# Patient Record
Sex: Male | Born: 1945
Health system: Southern US, Community
[De-identification: ages and names within clinical notes are randomized; demographics above are authoritative.]

## PROBLEM LIST (undated history)

## (undated) DIAGNOSIS — G4761 Periodic limb movement disorder: Secondary | ICD-10-CM

## (undated) DIAGNOSIS — N529 Male erectile dysfunction, unspecified: Secondary | ICD-10-CM

## (undated) DIAGNOSIS — I255 Ischemic cardiomyopathy: Secondary | ICD-10-CM

## (undated) DIAGNOSIS — T7840XA Allergy, unspecified, initial encounter: Secondary | ICD-10-CM

## (undated) DIAGNOSIS — I428 Other cardiomyopathies: Secondary | ICD-10-CM

## (undated) DIAGNOSIS — G245 Blepharospasm: Secondary | ICD-10-CM

## (undated) DIAGNOSIS — E039 Hypothyroidism, unspecified: Secondary | ICD-10-CM

## (undated) DIAGNOSIS — M1990 Unspecified osteoarthritis, unspecified site: Secondary | ICD-10-CM

## (undated) DIAGNOSIS — I509 Heart failure, unspecified: Secondary | ICD-10-CM

## (undated) DIAGNOSIS — I219 Acute myocardial infarction, unspecified: Secondary | ICD-10-CM

## (undated) DIAGNOSIS — K5790 Diverticulosis of intestine, part unspecified, without perforation or abscess without bleeding: Secondary | ICD-10-CM

## (undated) DIAGNOSIS — E559 Vitamin D deficiency, unspecified: Secondary | ICD-10-CM

## (undated) DIAGNOSIS — I5022 Chronic systolic (congestive) heart failure: Secondary | ICD-10-CM

## (undated) DIAGNOSIS — J189 Pneumonia, unspecified organism: Secondary | ICD-10-CM

## (undated) DIAGNOSIS — C801 Malignant (primary) neoplasm, unspecified: Secondary | ICD-10-CM

## (undated) DIAGNOSIS — Z86010 Personal history of colonic polyps: Secondary | ICD-10-CM

## (undated) DIAGNOSIS — R42 Dizziness and giddiness: Secondary | ICD-10-CM

## (undated) DIAGNOSIS — I2510 Atherosclerotic heart disease of native coronary artery without angina pectoris: Secondary | ICD-10-CM

## (undated) DIAGNOSIS — I739 Peripheral vascular disease, unspecified: Secondary | ICD-10-CM

## (undated) DIAGNOSIS — E785 Hyperlipidemia, unspecified: Secondary | ICD-10-CM

## (undated) DIAGNOSIS — Z95810 Presence of automatic (implantable) cardiac defibrillator: Secondary | ICD-10-CM

## (undated) DIAGNOSIS — I2589 Other forms of chronic ischemic heart disease: Secondary | ICD-10-CM

## (undated) DIAGNOSIS — G4710 Hypersomnia, unspecified: Secondary | ICD-10-CM

## (undated) DIAGNOSIS — G4730 Sleep apnea, unspecified: Secondary | ICD-10-CM

## (undated) DIAGNOSIS — G4733 Obstructive sleep apnea (adult) (pediatric): Secondary | ICD-10-CM

## (undated) DIAGNOSIS — F528 Other sexual dysfunction not due to a substance or known physiological condition: Secondary | ICD-10-CM

## (undated) DIAGNOSIS — J309 Allergic rhinitis, unspecified: Secondary | ICD-10-CM

## (undated) DIAGNOSIS — I10 Essential (primary) hypertension: Secondary | ICD-10-CM

## (undated) DIAGNOSIS — Z8719 Personal history of other diseases of the digestive system: Secondary | ICD-10-CM

## (undated) DIAGNOSIS — Z9089 Acquired absence of other organs: Secondary | ICD-10-CM

## (undated) DIAGNOSIS — Z951 Presence of aortocoronary bypass graft: Secondary | ICD-10-CM

## (undated) HISTORY — DX: Ischemic cardiomyopathy: I25.5

## (undated) HISTORY — DX: Allergic rhinitis, unspecified: J30.9

## (undated) HISTORY — PX: OTHER SURGICAL HISTORY: SHX169

## (undated) HISTORY — PX: TONSILLECTOMY AND ADENOIDECTOMY: SUR1326

## (undated) HISTORY — PX: PENILE PROSTHESIS IMPLANT: SHX240

## (undated) HISTORY — DX: Other forms of chronic ischemic heart disease: I25.89

## (undated) HISTORY — DX: Hypersomnia, unspecified: G47.10

## (undated) HISTORY — DX: Male erectile dysfunction, unspecified: N52.9

## (undated) HISTORY — DX: Other cardiomyopathies: I42.8

## (undated) HISTORY — DX: Hyperlipidemia, unspecified: E78.5

## (undated) HISTORY — DX: Dizziness and giddiness: R42

## (undated) HISTORY — DX: Diverticulosis of intestine, part unspecified, without perforation or abscess without bleeding: K57.90

## (undated) HISTORY — DX: Unspecified osteoarthritis, unspecified site: M19.90

## (undated) HISTORY — DX: Hypothyroidism, unspecified: E03.9

## (undated) HISTORY — DX: Acquired absence of other organs: Z90.89

## (undated) HISTORY — DX: Obstructive sleep apnea (adult) (pediatric): G47.33

## (undated) HISTORY — DX: Acute myocardial infarction, unspecified: I21.9

## (undated) HISTORY — DX: Allergy, unspecified, initial encounter: T78.40XA

## (undated) HISTORY — DX: Atherosclerotic heart disease of native coronary artery without angina pectoris: I25.10

## (undated) HISTORY — PX: CORONARY ARTERY BYPASS GRAFT: SHX141

## (undated) HISTORY — DX: Vitamin D deficiency, unspecified: E55.9

## (undated) HISTORY — DX: Personal history of colonic polyps: Z86.010

## (undated) HISTORY — DX: Periodic limb movement disorder: G47.61

## (undated) HISTORY — DX: Chronic systolic (congestive) heart failure: I50.22

## (undated) HISTORY — PX: COLONOSCOPY: SHX174

## (undated) HISTORY — DX: Presence of aortocoronary bypass graft: Z95.1

## (undated) HISTORY — PX: CHOLECYSTECTOMY: SHX55

## (undated) HISTORY — DX: Essential (primary) hypertension: I10

## (undated) HISTORY — DX: Sleep apnea, unspecified: G47.30

## (undated) HISTORY — DX: Other sexual dysfunction not due to a substance or known physiological condition: F52.8

## (undated) HISTORY — PX: THORACOTOMY: SUR1349

## (undated) MED FILL — Dexamethasone Sodium Phosphate Inj 100 MG/10ML: INTRAMUSCULAR | Qty: 1 | Status: AC

## (undated) MED FILL — Etoposide Inj 1 GM/50ML (20 MG/ML): INTRAVENOUS | Qty: 4.5 | Status: AC

## (undated) MED FILL — Etoposide Inj 1 GM/50ML (20 MG/ML): INTRAVENOUS | Qty: 5.5 | Status: AC

---

## 1997-08-10 HISTORY — PX: OTHER SURGICAL HISTORY: SHX169

## 1999-04-25 ENCOUNTER — Inpatient Hospital Stay (HOSPITAL_COMMUNITY): Admission: RE | Admit: 1999-04-25 | Discharge: 1999-04-26 | Payer: Self-pay | Admitting: Urology

## 1999-04-25 ENCOUNTER — Encounter: Payer: Self-pay | Admitting: Urology

## 2001-12-23 ENCOUNTER — Encounter: Admission: RE | Admit: 2001-12-23 | Discharge: 2001-12-23 | Payer: Self-pay | Admitting: Internal Medicine

## 2001-12-23 ENCOUNTER — Encounter: Payer: Self-pay | Admitting: Internal Medicine

## 2004-06-26 ENCOUNTER — Ambulatory Visit: Payer: Self-pay | Admitting: Internal Medicine

## 2004-07-18 ENCOUNTER — Ambulatory Visit: Payer: Self-pay | Admitting: Internal Medicine

## 2004-08-07 ENCOUNTER — Ambulatory Visit: Payer: Self-pay | Admitting: *Deleted

## 2004-09-22 ENCOUNTER — Ambulatory Visit: Payer: Self-pay | Admitting: Cardiology

## 2004-09-25 ENCOUNTER — Ambulatory Visit: Payer: Self-pay | Admitting: *Deleted

## 2004-12-15 ENCOUNTER — Ambulatory Visit: Payer: Self-pay | Admitting: Cardiology

## 2005-02-27 ENCOUNTER — Ambulatory Visit: Payer: Self-pay

## 2005-03-06 ENCOUNTER — Ambulatory Visit: Payer: Self-pay | Admitting: Cardiology

## 2005-05-21 ENCOUNTER — Ambulatory Visit: Payer: Self-pay

## 2005-06-11 ENCOUNTER — Ambulatory Visit: Payer: Self-pay | Admitting: Internal Medicine

## 2005-07-07 ENCOUNTER — Encounter: Admission: RE | Admit: 2005-07-07 | Discharge: 2005-07-07 | Payer: Self-pay | Admitting: Orthopedic Surgery

## 2005-10-02 ENCOUNTER — Ambulatory Visit: Payer: Self-pay | Admitting: Internal Medicine

## 2006-03-05 ENCOUNTER — Ambulatory Visit: Payer: Self-pay | Admitting: Cardiology

## 2006-03-05 ENCOUNTER — Ambulatory Visit: Payer: Self-pay

## 2006-03-12 ENCOUNTER — Ambulatory Visit: Payer: Self-pay | Admitting: Cardiology

## 2006-05-10 ENCOUNTER — Ambulatory Visit: Payer: Self-pay | Admitting: Cardiology

## 2006-06-21 ENCOUNTER — Ambulatory Visit: Payer: Self-pay | Admitting: Cardiology

## 2006-06-21 LAB — CONVERTED CEMR LAB
ALT: 26 units/L (ref 0–40)
AST: 21 units/L (ref 0–37)
Albumin: 3.8 g/dL (ref 3.5–5.2)
Alkaline Phosphatase: 44 units/L (ref 39–117)
Bilirubin, Direct: 0.2 mg/dL (ref 0.0–0.3)
Chol/HDL Ratio, serum: 5.8
Cholesterol: 185 mg/dL (ref 0–200)
HDL: 31.9 mg/dL — ABNORMAL LOW (ref >39.0)
LDL Cholesterol: 136 mg/dL — ABNORMAL HIGH (ref 0–99)
Total Bilirubin: 1.2 mg/dL (ref 0.3–1.2)
Total Protein: 7.1 g/dL (ref 6.0–8.3)
Triglyceride fasting, serum: 85 mg/dL (ref 0–149)
VLDL: 17 mg/dL (ref 0–40)

## 2006-06-24 ENCOUNTER — Ambulatory Visit: Payer: Self-pay | Admitting: Cardiology

## 2006-10-18 ENCOUNTER — Ambulatory Visit: Payer: Self-pay | Admitting: Internal Medicine

## 2006-10-18 LAB — CONVERTED CEMR LAB
ALT: 24 units/L (ref 0–40)
AST: 18 units/L (ref 0–37)
Albumin: 4 g/dL (ref 3.5–5.2)
Alkaline Phosphatase: 50 units/L (ref 39–117)
BUN: 15 mg/dL (ref 6–23)
Basophils Absolute: 0 10*3/uL (ref 0.0–0.1)
Basophils Relative: 0.6 % (ref 0.0–1.0)
Bilirubin Urine: NEGATIVE
Bilirubin, Direct: 0.2 mg/dL (ref 0.0–0.3)
CO2: 30 meq/L (ref 19–32)
Calcium: 9.2 mg/dL (ref 8.4–10.5)
Chloride: 102 meq/L (ref 96–112)
Cholesterol: 204 mg/dL (ref 0–200)
Creatinine, Ser: 1.1 mg/dL (ref 0.4–1.5)
Direct LDL: 136.5 mg/dL
Eosinophils Absolute: 0.2 10*3/uL (ref 0.0–0.6)
Eosinophils Relative: 3.2 % (ref 0.0–5.0)
GFR calc Af Amer: 88 mL/min
GFR calc non Af Amer: 72 mL/min
Glucose, Bld: 103 mg/dL — ABNORMAL HIGH (ref 70–99)
HCT: 46.1 % (ref 39.0–52.0)
HDL: 41.7 mg/dL (ref >39.0)
Hemoglobin, Urine: NEGATIVE
Hemoglobin: 15.8 g/dL (ref 13.0–17.0)
Ketones, ur: NEGATIVE mg/dL
Leukocytes, UA: NEGATIVE
Lymphocytes Relative: 33 % (ref 12.0–46.0)
MCHC: 34.3 g/dL (ref 30.0–36.0)
MCV: 92.1 fL (ref 78.0–100.0)
Monocytes Absolute: 0.4 10*3/uL (ref 0.2–0.7)
Monocytes Relative: 6.8 % (ref 3.0–11.0)
Neutro Abs: 3 10*3/uL (ref 1.4–7.7)
Neutrophils Relative %: 56.4 % (ref 43.0–77.0)
Nitrite: NEGATIVE
PSA: 0.65 ng/mL (ref 0.10–4.00)
Platelets: 187 10*3/uL (ref 150–400)
Potassium: 4.5 meq/L (ref 3.5–5.1)
RBC: 5.01 M/uL (ref 4.22–5.81)
RDW: 12.1 % (ref 11.5–14.6)
Sodium: 139 meq/L (ref 135–145)
Specific Gravity, Urine: 1.025 (ref 1.000–1.03)
TSH: 2.91 microintl units/mL (ref 0.35–5.50)
Total Bilirubin: 0.9 mg/dL (ref 0.3–1.2)
Total CHOL/HDL Ratio: 4.9
Total Protein, Urine: NEGATIVE mg/dL
Total Protein: 7.3 g/dL (ref 6.0–8.3)
Triglycerides: 185 mg/dL — ABNORMAL HIGH (ref 0–149)
Urine Glucose: NEGATIVE mg/dL
Urobilinogen, UA: 0.2 (ref 0.0–1.0)
VLDL: 37 mg/dL (ref 0–40)
WBC: 5.3 10*3/uL (ref 4.5–10.5)
pH: 5 (ref 5.0–8.0)

## 2006-12-13 ENCOUNTER — Ambulatory Visit: Payer: Self-pay | Admitting: Cardiology

## 2006-12-13 LAB — CONVERTED CEMR LAB
ALT: 19 units/L (ref 0–40)
AST: 19 units/L (ref 0–37)
Albumin: 3.8 g/dL (ref 3.5–5.2)
Alkaline Phosphatase: 47 units/L (ref 39–117)
Bilirubin, Direct: 0.2 mg/dL (ref 0.0–0.3)
Cholesterol: 239 mg/dL (ref 0–200)
Direct LDL: 169.4 mg/dL
HDL: 34.1 mg/dL — ABNORMAL LOW (ref >39.0)
Total Bilirubin: 1.3 mg/dL — ABNORMAL HIGH (ref 0.3–1.2)
Total CHOL/HDL Ratio: 7
Total Protein: 6.9 g/dL (ref 6.0–8.3)
Triglycerides: 122 mg/dL (ref 0–149)
VLDL: 24 mg/dL (ref 0–40)

## 2006-12-27 ENCOUNTER — Ambulatory Visit: Payer: Self-pay | Admitting: Internal Medicine

## 2007-01-26 ENCOUNTER — Ambulatory Visit: Payer: Self-pay | Admitting: Cardiology

## 2007-01-26 LAB — CONVERTED CEMR LAB
BUN: 14 mg/dL (ref 6–23)
CO2: 31 meq/L (ref 19–32)
Calcium: 9.3 mg/dL (ref 8.4–10.5)
Chloride: 103 meq/L (ref 96–112)
Creatinine, Ser: 1 mg/dL (ref 0.4–1.5)
GFR calc Af Amer: 98 mL/min
GFR calc non Af Amer: 81 mL/min
Glucose, Bld: 100 mg/dL — ABNORMAL HIGH (ref 70–99)
Potassium: 4 meq/L (ref 3.5–5.1)
Pro B Natriuretic peptide (BNP): 42 pg/mL (ref 0.0–100.0)
Sodium: 139 meq/L (ref 135–145)

## 2007-02-25 ENCOUNTER — Ambulatory Visit: Payer: Self-pay

## 2007-02-25 ENCOUNTER — Encounter: Payer: Self-pay | Admitting: Cardiology

## 2007-04-15 ENCOUNTER — Ambulatory Visit: Payer: Self-pay | Admitting: Cardiology

## 2007-04-15 LAB — CONVERTED CEMR LAB
ALT: 21 units/L (ref 0–53)
AST: 21 units/L (ref 0–37)
Albumin: 3.8 g/dL (ref 3.5–5.2)
Alkaline Phosphatase: 46 units/L (ref 39–117)
Total Bilirubin: 1.2 mg/dL (ref 0.3–1.2)

## 2007-05-02 ENCOUNTER — Ambulatory Visit: Payer: Self-pay | Admitting: Internal Medicine

## 2007-06-04 ENCOUNTER — Encounter: Payer: Self-pay | Admitting: *Deleted

## 2007-06-04 DIAGNOSIS — Z9089 Acquired absence of other organs: Secondary | ICD-10-CM | POA: Insufficient documentation

## 2007-06-04 DIAGNOSIS — I255 Ischemic cardiomyopathy: Secondary | ICD-10-CM | POA: Insufficient documentation

## 2007-06-04 DIAGNOSIS — I2510 Atherosclerotic heart disease of native coronary artery without angina pectoris: Secondary | ICD-10-CM | POA: Insufficient documentation

## 2007-06-04 DIAGNOSIS — Z951 Presence of aortocoronary bypass graft: Secondary | ICD-10-CM | POA: Insufficient documentation

## 2007-06-04 DIAGNOSIS — T7840XA Allergy, unspecified, initial encounter: Secondary | ICD-10-CM | POA: Insufficient documentation

## 2007-06-04 DIAGNOSIS — Z9689 Presence of other specified functional implants: Secondary | ICD-10-CM | POA: Insufficient documentation

## 2007-06-04 DIAGNOSIS — I10 Essential (primary) hypertension: Secondary | ICD-10-CM | POA: Insufficient documentation

## 2007-06-04 DIAGNOSIS — I2589 Other forms of chronic ischemic heart disease: Secondary | ICD-10-CM

## 2007-06-04 DIAGNOSIS — F528 Other sexual dysfunction not due to a substance or known physiological condition: Secondary | ICD-10-CM

## 2007-06-04 DIAGNOSIS — E785 Hyperlipidemia, unspecified: Secondary | ICD-10-CM | POA: Insufficient documentation

## 2007-06-04 HISTORY — DX: Hyperlipidemia, unspecified: E78.5

## 2007-06-04 HISTORY — DX: Acquired absence of other organs: Z90.89

## 2007-06-04 HISTORY — DX: Other forms of chronic ischemic heart disease: I25.89

## 2007-06-04 HISTORY — DX: Essential (primary) hypertension: I10

## 2007-06-04 HISTORY — DX: Presence of aortocoronary bypass graft: Z95.1

## 2007-06-04 HISTORY — DX: Atherosclerotic heart disease of native coronary artery without angina pectoris: I25.10

## 2007-07-06 ENCOUNTER — Ambulatory Visit: Payer: Self-pay | Admitting: Cardiology

## 2007-07-06 LAB — CONVERTED CEMR LAB
Albumin: 3.9 g/dL (ref 3.5–5.2)
Alkaline Phosphatase: 43 units/L (ref 39–117)
Cholesterol: 138 mg/dL (ref 0–200)
LDL Cholesterol: 84 mg/dL (ref 0–99)
Total CHOL/HDL Ratio: 3.8
Total Protein: 7.2 g/dL (ref 6.0–8.3)

## 2007-08-23 ENCOUNTER — Ambulatory Visit: Payer: Self-pay | Admitting: Cardiology

## 2007-08-23 LAB — CONVERTED CEMR LAB
AST: 18 units/L (ref 0–37)
Bilirubin, Direct: 0.2 mg/dL (ref 0.0–0.3)
HDL: 35.3 mg/dL — ABNORMAL LOW (ref >39.0)
Triglycerides: 88 mg/dL (ref 0–149)
VLDL: 18 mg/dL (ref 0–40)

## 2007-08-29 ENCOUNTER — Ambulatory Visit: Payer: Self-pay | Admitting: Cardiology

## 2007-10-19 ENCOUNTER — Ambulatory Visit: Payer: Self-pay | Admitting: Cardiology

## 2007-10-19 LAB — CONVERTED CEMR LAB
ALT: 25 units/L (ref 0–53)
AST: 21 units/L (ref 0–37)
Alkaline Phosphatase: 40 units/L (ref 39–117)
Cholesterol: 142 mg/dL (ref 0–200)
Total Bilirubin: 1.1 mg/dL (ref 0.3–1.2)
Total CHOL/HDL Ratio: 3.7
Total Protein: 7 g/dL (ref 6.0–8.3)

## 2007-10-24 ENCOUNTER — Ambulatory Visit: Payer: Self-pay | Admitting: Internal Medicine

## 2007-10-31 ENCOUNTER — Ambulatory Visit: Payer: Self-pay | Admitting: Internal Medicine

## 2007-10-31 DIAGNOSIS — J309 Allergic rhinitis, unspecified: Secondary | ICD-10-CM | POA: Insufficient documentation

## 2007-10-31 DIAGNOSIS — Z860100 Personal history of colon polyps, unspecified: Secondary | ICD-10-CM | POA: Insufficient documentation

## 2007-10-31 HISTORY — DX: Personal history of colonic polyps: Z86.010

## 2007-10-31 HISTORY — DX: Personal history of colon polyps, unspecified: Z86.0100

## 2007-10-31 HISTORY — DX: Allergic rhinitis, unspecified: J30.9

## 2007-11-01 LAB — CONVERTED CEMR LAB
Basophils Relative: 0.8 % (ref 0.0–1.0)
Bilirubin, Direct: 0.2 mg/dL (ref 0.0–0.3)
CO2: 32 meq/L (ref 19–32)
Creatinine, Ser: 1 mg/dL (ref 0.4–1.5)
Eosinophils Relative: 3.7 % (ref 0.0–5.0)
GFR calc Af Amer: 97 mL/min
Glucose, Bld: 94 mg/dL (ref 70–99)
HCT: 44.4 % (ref 39.0–52.0)
HDL: 39.7 mg/dL (ref >39.0)
Hemoglobin: 14.8 g/dL (ref 13.0–17.0)
Lymphocytes Relative: 31.5 % (ref 12.0–46.0)
Monocytes Absolute: 0.4 10*3/uL (ref 0.2–0.7)
Neutro Abs: 2.6 10*3/uL (ref 1.4–7.7)
Neutrophils Relative %: 55 % (ref 43.0–77.0)
Potassium: 4.7 meq/L (ref 3.5–5.1)
TSH: 8.4 microintl units/mL — ABNORMAL HIGH (ref 0.35–5.50)
Total Bilirubin: 0.8 mg/dL (ref 0.3–1.2)
Total Protein: 7 g/dL (ref 6.0–8.3)
VLDL: 20 mg/dL (ref 0–40)
WBC: 4.6 10*3/uL (ref 4.5–10.5)

## 2007-11-14 ENCOUNTER — Telehealth (INDEPENDENT_AMBULATORY_CARE_PROVIDER_SITE_OTHER): Payer: Self-pay | Admitting: *Deleted

## 2008-02-07 ENCOUNTER — Ambulatory Visit: Payer: Self-pay | Admitting: Cardiology

## 2008-02-07 LAB — CONVERTED CEMR LAB
Alkaline Phosphatase: 42 units/L (ref 39–117)
Bilirubin, Direct: 0.1 mg/dL (ref 0.0–0.3)
Cholesterol: 128 mg/dL (ref 0–200)
LDL Cholesterol: 73 mg/dL (ref 0–99)
Total Bilirubin: 0.9 mg/dL (ref 0.3–1.2)
Total Protein: 6.9 g/dL (ref 6.0–8.3)

## 2008-02-20 ENCOUNTER — Ambulatory Visit: Payer: Self-pay | Admitting: Cardiology

## 2008-04-30 ENCOUNTER — Telehealth: Payer: Self-pay | Admitting: Internal Medicine

## 2008-05-30 ENCOUNTER — Ambulatory Visit: Payer: Self-pay | Admitting: Cardiology

## 2008-05-30 LAB — CONVERTED CEMR LAB
BUN: 19 mg/dL (ref 6–23)
Chloride: 105 meq/L (ref 96–112)
Creatinine, Ser: 0.9 mg/dL (ref 0.4–1.5)
GFR calc non Af Amer: 91 mL/min
Glucose, Bld: 120 mg/dL — ABNORMAL HIGH (ref 70–99)

## 2008-08-13 ENCOUNTER — Ambulatory Visit: Payer: Self-pay | Admitting: Cardiology

## 2008-08-13 LAB — CONVERTED CEMR LAB
ALT: 25 units/L (ref 0–53)
Bilirubin, Direct: 0.2 mg/dL (ref 0.0–0.3)
HDL: 41.7 mg/dL (ref >39.0)
LDL Cholesterol: 110 mg/dL — ABNORMAL HIGH (ref 0–99)
Total Bilirubin: 1 mg/dL (ref 0.3–1.2)
Total CHOL/HDL Ratio: 4.1
VLDL: 19 mg/dL (ref 0–40)

## 2008-08-23 ENCOUNTER — Ambulatory Visit: Payer: Self-pay | Admitting: Cardiovascular Disease

## 2008-10-29 ENCOUNTER — Ambulatory Visit: Payer: Self-pay | Admitting: Internal Medicine

## 2008-10-29 LAB — CONVERTED CEMR LAB
ALT: 23 units/L (ref 0–53)
AST: 24 units/L (ref 0–37)
Albumin: 4.1 g/dL (ref 3.5–5.2)
Alkaline Phosphatase: 50 units/L (ref 39–117)
BUN: 14 mg/dL (ref 6–23)
Basophils Absolute: 0.1 10*3/uL (ref 0.0–0.1)
CO2: 33 meq/L — ABNORMAL HIGH (ref 19–32)
Chloride: 103 meq/L (ref 96–112)
Cholesterol: 166 mg/dL (ref 0–200)
Eosinophils Relative: 5.1 % — ABNORMAL HIGH (ref 0.0–5.0)
Glucose, Bld: 87 mg/dL (ref 70–99)
HCT: 43.7 % (ref 39.0–52.0)
LDL Cholesterol: 98 mg/dL (ref 0–99)
Leukocytes, UA: NEGATIVE
Lymphs Abs: 1.4 10*3/uL (ref 0.7–4.0)
Monocytes Relative: 9.6 % (ref 3.0–12.0)
Neutrophils Relative %: 58.4 % (ref 43.0–77.0)
Nitrite: NEGATIVE
PSA: 0.57 ng/mL (ref 0.10–4.00)
Platelets: 130 10*3/uL — ABNORMAL LOW (ref 150.0–400.0)
Potassium: 4 meq/L (ref 3.5–5.1)
RDW: 12.1 % (ref 11.5–14.6)
Sodium: 141 meq/L (ref 135–145)
Specific Gravity, Urine: >=1.03 (ref 1.000–1.030)
Total Protein, Urine: NEGATIVE mg/dL
Total Protein: 7.2 g/dL (ref 6.0–8.3)
Triglycerides: 150 mg/dL — ABNORMAL HIGH (ref 0.0–149.0)
VLDL: 30 mg/dL (ref 0.0–40.0)
WBC: 5.6 10*3/uL (ref 4.5–10.5)
pH: 5.5 (ref 5.0–8.0)

## 2008-11-08 ENCOUNTER — Telehealth: Payer: Self-pay | Admitting: Internal Medicine

## 2008-11-22 ENCOUNTER — Encounter (INDEPENDENT_AMBULATORY_CARE_PROVIDER_SITE_OTHER): Payer: Self-pay | Admitting: *Deleted

## 2008-12-10 ENCOUNTER — Ambulatory Visit: Payer: Self-pay | Admitting: Internal Medicine

## 2008-12-31 LAB — CONVERTED CEMR LAB
AST: 20 units/L (ref 0–37)
Alkaline Phosphatase: 40 units/L (ref 39–117)
Bilirubin, Direct: 0.2 mg/dL (ref 0.0–0.3)
HDL: 38.6 mg/dL — ABNORMAL LOW (ref >39.00)
LDL Cholesterol: 81 mg/dL (ref 0–99)
Total CHOL/HDL Ratio: 3
VLDL: 14.6 mg/dL (ref 0.0–40.0)

## 2009-01-01 ENCOUNTER — Telehealth (INDEPENDENT_AMBULATORY_CARE_PROVIDER_SITE_OTHER): Payer: Self-pay | Admitting: *Deleted

## 2009-01-14 ENCOUNTER — Ambulatory Visit: Payer: Self-pay | Admitting: Cardiovascular Disease

## 2009-02-06 ENCOUNTER — Encounter (INDEPENDENT_AMBULATORY_CARE_PROVIDER_SITE_OTHER): Payer: Self-pay | Admitting: *Deleted

## 2009-07-29 DIAGNOSIS — I428 Other cardiomyopathies: Secondary | ICD-10-CM

## 2009-07-30 ENCOUNTER — Ambulatory Visit: Payer: Self-pay | Admitting: Cardiology

## 2009-07-30 DIAGNOSIS — R0602 Shortness of breath: Secondary | ICD-10-CM | POA: Insufficient documentation

## 2009-08-22 ENCOUNTER — Telehealth (INDEPENDENT_AMBULATORY_CARE_PROVIDER_SITE_OTHER): Payer: Self-pay

## 2009-08-23 ENCOUNTER — Telehealth: Payer: Self-pay | Admitting: Internal Medicine

## 2009-08-26 ENCOUNTER — Encounter: Payer: Self-pay | Admitting: Cardiology

## 2009-08-26 ENCOUNTER — Ambulatory Visit: Payer: Self-pay | Admitting: Cardiology

## 2009-08-26 ENCOUNTER — Ambulatory Visit: Payer: Self-pay

## 2009-08-26 ENCOUNTER — Encounter (HOSPITAL_COMMUNITY): Admission: RE | Admit: 2009-08-26 | Discharge: 2009-11-15 | Payer: Self-pay | Admitting: Cardiology

## 2009-08-28 ENCOUNTER — Telehealth: Payer: Self-pay | Admitting: Cardiology

## 2009-09-20 ENCOUNTER — Ambulatory Visit: Payer: Self-pay | Admitting: Cardiology

## 2009-09-26 LAB — CONVERTED CEMR LAB
ALT: 23 units/L (ref 0–53)
AST: 21 units/L (ref 0–37)
Albumin: 4 g/dL (ref 3.5–5.2)
Alkaline Phosphatase: 49 units/L (ref 39–117)
Cholesterol: 213 mg/dL — ABNORMAL HIGH (ref 0–200)
Total CHOL/HDL Ratio: 5

## 2010-03-28 ENCOUNTER — Ambulatory Visit: Payer: Self-pay | Admitting: Cardiology

## 2010-03-28 LAB — CONVERTED CEMR LAB
ALT: 22 units/L (ref 0–53)
Bilirubin, Direct: 0.1 mg/dL (ref 0.0–0.3)
HDL: 31.5 mg/dL — ABNORMAL LOW (ref >39.00)
Total Bilirubin: 1.1 mg/dL (ref 0.3–1.2)

## 2010-04-10 ENCOUNTER — Ambulatory Visit: Payer: Self-pay | Admitting: Cardiology

## 2010-09-07 LAB — CONVERTED CEMR LAB
ALT: 19 units/L (ref 0–53)
Albumin: 4 g/dL (ref 3.5–5.2)
Bilirubin, Direct: 0.1 mg/dL (ref 0.0–0.3)
Calcium: 9.3 mg/dL (ref 8.4–10.5)
Cholesterol: 234 mg/dL — ABNORMAL HIGH (ref 0–200)
Direct LDL: 174.2 mg/dL
GFR calc non Af Amer: 71.55 mL/min (ref >60)
HDL: 38.4 mg/dL — ABNORMAL LOW (ref >39.00)
Potassium: 4.6 meq/L (ref 3.5–5.1)
Sodium: 141 meq/L (ref 135–145)
Total Protein: 7.1 g/dL (ref 6.0–8.3)
Triglycerides: 132 mg/dL (ref 0.0–149.0)

## 2010-09-09 NOTE — Assessment & Plan Note (Signed)
Summary: Cardiology Nuclear Study  Nuclear Med Background Indications for Stress Test: Evaluation for Ischemia, Graft Patency   History: Abnormal EKG, CABG, Echo, Myocardial Infarction, Myocardial Perfusion Study  History Comments: '98 AWMI>CABG x 1; 7/08 ZOX:WRUEA antero-apical infarct with small inferior ischemia, EF=41%; 7/08 Echo:EF=40-45%.  Symptoms: DOE    Nuclear Pre-Procedure Cardiac Risk Factors: Family History - CAD, History of Smoking, Hypertension, Lipids, Obesity Caffeine/Decaff Intake: None NPO After: 8:00 AM Lungs: Clear.  O2 Sat=95%. IV 0.9% NS with Angio Cath: 22g     IV Site: (R) AC IV Started by: Irean Hong RN Chest Size (in) 48     Height (in): 71 Weight (lb): 237 BMI: 33.17  Nuclear Med Study 1 or 2 day study:  1 day     Stress Test Type:  Eugenie Birks Reading MD:  Marca Ancona, MD     Referring MD:  Olga Millers, MD Resting Radionuclide:  Technetium 78m Tetrofosmin     Resting Radionuclide Dose:  10.0 mCi  Stress Radionuclide:  Technetium 55m Tetrofosmin     Stress Radionuclide Dose:  33.0 mCi   Stress Protocol   Lexiscan: 0.4 mg   Stress Test Technologist:  Rea College CMA-N     Nuclear Technologist:  Burna Mortimer Deal RT-N  Rest Procedure  Myocardial perfusion imaging was performed at rest 45 minutes following the intravenous administration of Myoview Technetium 29m Tetrofosmin.  Stress Procedure  The patient received IV Lexiscan 0.4 mg over 15-seconds.  Myoview injected at 30-seconds.  There were no significant changes with infusion, rare PVC.  Quantitative spect images were obtained after a 45 minute delay.  QPS Raw Data Images:  Normal; no motion artifact; normal heart/lung ratio. Stress Images:  Large, severe mid to apical anterior, mid to apical anteroseptal, and true apex defect.  Rest Images:  Same as stress.  Subtraction (SDS):  Fixed mid to apical anterior, mid to apical anteroseptal, and true apex defect.  Transient Ischemic Dilatation:   1.10  (Normal <1.22)  Lung/Heart Ratio:  .30  (Normal <0.45)  Quantitative Gated Spect Images QGS EDV:  151 ml QGS ESV:  87 ml QGS EF:  43 % QGS cine images:  Akinetic apex, mid to apical anterior wall, and mid to apical anteroseptal wall.   Overall Impression  Exercise Capacity: Lexiscan study BP Response: Normal blood pressure response. Clinical Symptoms: Lightheaded, hot ECG Impression: No significant ST segment change suggestive of ischemia. Overall Impression: Severe, fixed mid to apical anterior, mid to apical anteroseptal, and true apex defect.  Overall Impression Comments: Infarct with no ischemia.  EF 43% with akinetic apex.   Appended Document: Cardiology Nuclear Study ok  Appended Document: Cardiology Nuclear Study LMTCB./CY  Appended Document: Cardiology Nuclear Study Patient aware of stress test results.

## 2010-09-09 NOTE — Assessment & Plan Note (Signed)
Summary: rov/ gd   Primary Provider:  Corwin Levins MD  CC:  check up.  History of Present Illness: [name] [name] is a very pleasant gentleman who has a history of coronary artery disease dating back to 1998 when he had a myocardial infarction. He subsequently underwent minimally invasive LIMA to his LAD.  Most recent Myoview was performed in Jan 2011.  At time, his ejection fraction was 43%.  There is a large anteroapical infarct with no ischemia.  We have been treating this medically.  An echocardiogram on February 25, 2007, showed an ejection fraction of 40-45%. There was mild mitral regurgitation as well as tricuspid regurgitation. I last saw him in Dec 2010. Since then the patient has dyspnea with more extreme activities but not with routine activities. It is relieved with rest. It is not associated with chest pain. There is no orthopnea, PND or pedal edema. There is no syncope or palpitations. There is no exertional chest pain.   Current Medications (verified): 1)  Aspirin 325 Mg  Tabs (Aspirin) .... Take One Tablet Once Daily 2)  Diovan 160 Mg  Tabs (Valsartan) .... Take One Tablet Once Daily 3)  Toprol Xl 25 Mg  Tb24 (Metoprolol Succinate) .... Take One Tablet Once Daily 4)  Fish Oil   Oil (Fish Oil) .... Take 4 Capsules By Mouth Daily 5)  Synthroid 100 Mcg Tabs (Levothyroxine Sodium) .Marland Kitchen.. 1 By Mouth Once Daily 6)  Zetia 10 Mg Tabs (Ezetimibe) .... Take One Tablet By Mouth Daily. 7)  Vitamin D 400 Unit Caps (Cholecalciferol) .Marland Kitchen.. 1 Tab By Mouth Once Daily 8)  Multivitamins   Tabs (Multiple Vitamin) .Marland Kitchen.. 1 Tab By Mouth Once Daily  Allergies: 1)  ! Ace Inhibitors 2)  ! Crestor 3)  * Statins  Past History:  Past Medical History: HYPERLIPIDEMIA (ICD-272.4) CORONARY ARTERY BYPASS GRAFT, HX OF (ICD-V45.81) ISCHEMIC CARDIOMYOPATHY (ICD-414.8) HYPERTENSION (ICD-401.9) ALLERGIC RHINITIS (ICD-477.9) COLONIC POLYPS, HX OF (ICD-V12.72) PENILE PROSTHESIS (ICD-V43.89) erectile  dysfunction  Past Surgical History: Reviewed history from 10/31/2007 and no changes required. * LEFT ANTERIOR THORACOTOMY WOUND EXPLORATION AND DEBRIDEMENT CHOLECYSTECTOMY, HX OF (ICD-V45.79) PENILE PROSTHESIS (ICD-V43.89) CORONARY ARTERY BYPASS GRAFT, HX OF (ICD-V45.81) s/p spinal cyst  Social History: Reviewed history from 10/29/2008 and no changes required. 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 Remaining systems are negative.   Vital Signs:  Patient profile:   65 year old male Height:      71 inches Weight:      232 pounds BMI:     32.47 Pulse rate:   56 / minute Resp:     12 per minute BP sitting:   103 / 71  (left arm)  Vitals Entered By: Kem Parkinson (April 10, 2010 8:57 AM)  Physical Exam  General:  Well-developed well-nourished in no acute distress.  Skin is warm and dry.  HEENT is normal.  Neck is supple. No thyromegaly.  Chest is clear to auscultation with normal expansion.  Cardiovascular exam is regular rate and rhythm.  Abdominal exam nontender or distended. No masses palpated. Extremities show no edema. neuro grossly intact    EKG  Procedure date:  04/10/2010  Findings:      Sinus at a rate of 56. Occasional PVCs. Prior anterior  infarct.  Impression & Recommendations:  Problem # 1:  HYPERLIPIDEMIA (ICD-272.4)  Intolerant to statins. Continue present medications. Patient to be evaluated in lipid clinic. His updated medication list for this problem includes:    Zetia 10 Mg Tabs (Ezetimibe) .Marland Kitchen... Take one tablet by mouth daily.  His updated medication list for this problem includes:    Zetia 10 Mg Tabs (Ezetimibe) .Marland Kitchen... Take one tablet by mouth daily.  Problem # 2:   CORONARY ARTERY BYPASS GRAFT, HX OF (ICD-V45.81)  Continue aspirin, beta blocker, ACE inhibitor. Intolerant to statins. Last Myoview low risk.  Continue risk factor modification.  Orders: EKG w/ Interpretation (93000)  Problem # 3:  ISCHEMIC CARDIOMYOPATHY (ICD-414.8) Continue ARB and beta blocker. His updated medication list for this problem includes:    Aspirin 325 Mg Tabs (Aspirin) .Marland Kitchen... Take one tablet once daily    Diovan 160 Mg Tabs (Valsartan) .Marland Kitchen... Take one tablet once daily    Toprol Xl 25 Mg Tb24 (Metoprolol succinate) .Marland Kitchen... Take one tablet once daily  Problem # 4:  HYPERTENSION (ICD-401.9)  Blood pressure controlled on present medications. Will continue. His updated medication list for this problem includes:    Aspirin 325 Mg Tabs (Aspirin) .Marland Kitchen... Take one tablet once daily    Diovan 160 Mg Tabs (Valsartan) .Marland Kitchen... Take one tablet once daily    Toprol Xl 25 Mg Tb24 (Metoprolol succinate) .Marland Kitchen... Take one tablet once daily  His updated medication list for this problem includes:    Aspirin 325 Mg Tabs (Aspirin) .Marland Kitchen... Take one tablet once daily    Diovan 160 Mg Tabs (Valsartan) .Marland Kitchen... Take one tablet once daily    Toprol Xl 25 Mg Tb24 (Metoprolol succinate) .Marland Kitchen... Take one tablet once daily  His updated medication list for this problem includes:    Aspirin 325 Mg Tabs (Aspirin) .Marland Kitchen... Take one tablet once daily    Diovan 160 Mg Tabs (Valsartan) .Marland Kitchen... Take one tablet once daily    Toprol Xl 25 Mg Tb24 (Metoprolol succinate) .Marland Kitchen... Take one tablet once daily  Patient Instructions: 1)  Your physician recommends that you schedule a follow-up appointment in: 1 yr with Dr Jens Som 2)  Your physician recommends that you continue on your current medications as directed. Please refer to the Current Medication list given to you today. Prescriptions: TOPROL XL 25 MG  TB24 (METOPROLOL SUCCINATE) Take one tablet once daily  #90 x 3   Entered by:   Kem Parkinson   Authorized by:   Ferman Hamming, MD, [facility]   Signed by:   Kem Parkinson on 04/10/2010   Method used:   Faxed to ...       [facility] (mail-order)       [address] [place], [place]  [address]       Ph: [phone]       Fax: [phone]   RxID:   [id number] DIOVAN 160 MG  TABS (VALSARTAN) Take one tablet once daily  #90 x 3   Entered by:   Kem Parkinson   Authorized by:   Ferman Hamming, MD, [facility]   Signed by:   Kem Parkinson on 04/10/2010   Method used:   Faxed to ...       [facility] (mail-order)       [address] [place], [place]  [address]       Ph: [phone]       Fax:  [phone]   RxID:   [id number]

## 2010-09-09 NOTE — Progress Notes (Signed)
Summary: RETURNING CALL  Phone Note Call from Patient Call back at [phone]   Caller: Patient Reason for Call: Talk to Nurse Summary of Call: RETURNING CALL Initial call taken by: Migdalia Dk,  August 28, 2009 8:35 AM  Follow-up for Phone Call        Called patient with stress test results. Follow-up by: Suzan Garibaldi RN

## 2010-09-09 NOTE — Progress Notes (Signed)
Summary: Nuc. Pre-Procedure  Phone Note Outgoing Call Call back at [facility] Phone [phone]   Call placed by: Irean Hong, RN,  August 22, 2009 10:14 AM Summary of Call: Reviewed information on Myoview Information Sheet (see scanned document for further details).  Spoke with patient's wife per Roselind Messier.     Nuclear Med Background Indications for Stress Test: Evaluation for Ischemia, Graft Patency   History: Abnormal EKG, CABG, Echo, Myocardial Infarction, Myocardial Perfusion Study  History Comments: '98 MI>CABG (x1) [place]. 7/08 MPS: EF=41%, Large anteroapical infarct with small inferior ischemia. 7/08 Echo: EF=40-45%.  Symptoms: DOE    Nuclear Pre-Procedure Cardiac Risk Factors: Family History - CAD, History of Smoking, Hypertension, Lipids Height (in): 71

## 2010-09-09 NOTE — Progress Notes (Signed)
Summary: Change Practice.  Phone Note Outgoing Call Call back at [facility] Phone [phone]   Call placed by: Harlow Mares CMA Duncan Dull),  August 23, 2009 2:31 PM Call placed to: Patient Summary of Call: spoke to the patients wife she states that the patient has had his colonscopy at the [place] already.  Initial call taken by: Harlow Mares CMA Duncan Dull),  August 23, 2009 2:31 PM     Appended Document: Change Practice. I had Lady Gary put a note in IDX that patient changed practices.

## 2010-09-09 NOTE — Assessment & Plan Note (Signed)
Summary: rov/sp   Primary Provider:  Corwin Levins MD  CC:  dyslipidemia follow-up.  History of Present Illness:  Lipid Clinic Visit      The patient comes in today for dyslipidemia follow-up.  The patient has no complaints of medication problems, chest pain, palpitations, shortness of breath, muscle aches, and muscle cramps.  Dietary compliance review reveals an overall grade of not eating 5 or more fruits and vegetables, not counting carbohydrates, and not limiting fats and TFA's.  Review of exercise habits reveals that the patient is walking and ocassionally.  Adjunctive measures being instituted include omega-3 supplements.  Compliance with medication is good.    Lipid Clinic Visit      The patient comes in today for dyslipidemia follow-up.  The patient has a history of medication problems while taking statins which includes muscle aches and muscle cramps in legs.  He reports these are better and tolerable since changing Crestor to every other day.   Dietary compliance review reveals pt is limiting fats and TFA's and has cut his food intake by about half.  Review of exercise habits reveals that the patient is walking, ocassionally, and for 20-30 minutes.  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  Compliance with medication is good.    New compliants of muscle pains in legs with crestor after a few months treatment.  This has occurred with all tried statins. Tolerating zetia  Lipid Management Provider  Shelby Dubin, PharmD, BCPS, CPP  Preventive Screening-Counseling & Management  Alcohol-Tobacco     Alcohol drinks/day: <1     Alcohol type: occassional     Smoking Status: quit > 6 months  Caffeine-Diet-Exercise     Caffeine use/day: 2 dr peppers /day  Current Medications (verified): 1)  Aspirin 325 Mg  Tabs (Aspirin) .... Take One Tablet Once Daily 2)  Diovan 160 Mg  Tabs (Valsartan) .... Take One Tablet Once Daily 3)  Toprol Xl 25 Mg  Tb24  (Metoprolol Succinate) .... Take One Tablet Once Daily 4)  Fish Oil   Oil (Fish Oil) .... Take 4 Capsules By Mouth Daily 5)  Synthroid 100 Mcg Tabs (Levothyroxine Sodium) .Marland Kitchen.. 1 By Mouth Once Daily 6)  Zetia 10 Mg Tabs (Ezetimibe) .... Take One Tablet By Mouth Daily. 7)  Vitamin D 400 Unit Caps (Cholecalciferol) .Marland Kitchen.. 1 Tab By Mouth Once Daily 8)  Multivitamins   Tabs (Multiple Vitamin) .Marland Kitchen.. 1 Tab By Mouth Once Daily  Allergies: 1)  ! Ace Inhibitors 2)  ! Crestor 3)  * Statins  Social History: Alcohol drinks/day:  <1 Caffeine use/day:  2 dr peppers /day Smoking Status:  quit > 6 months   Vital Signs:  Patient profile:   65 year old male Height:      71 inches Weight:      237 pounds Pulse rate:   60 / minute Pulse rhythm:   regular BP sitting:   118 / 82 Cuff size:   regular  Impression & Recommendations:  Problem # 1:  HYPERLIPIDEMIA (ICD-272.4)  His updated medication list for this problem includes:    Zetia 10 Mg Tabs (Ezetimibe) .Marland Kitchen... Take one tablet by mouth daily.  Mr Smolinsky returns to lipid clinic with no complaints.  He is tolerating Zetia well and has no more muscle pains since d/c of crestor.  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  He walks about on  the days he is off before he tires and spends the rest of the day lounging.  He does not like any fresh fruit, but will eat canned peaches and pears in syrup.  He only likes bell pepers as a vege.  Eats at [employer] 5 days a week.   TC 214 > than last and > goal < 200  TG > last (80) > goal < 150  HDL 31 < last (43) < goal > 40  LDL 150 < last (165) > goal < 70  Very unwilling to make lifestyle changes Will pack a healthy breakfast/lunch 2/week will add 1 bottle of water /day will not stop 2 dr pepper/day encouraged to add some vege snacks and decrease daily cookies will walk 2x for each on off days f/u 6months understands will probably add Niaspan at next visit

## 2010-10-06 ENCOUNTER — Other Ambulatory Visit: Payer: Self-pay

## 2010-10-13 ENCOUNTER — Ambulatory Visit: Payer: Self-pay

## 2010-12-02 ENCOUNTER — Telehealth: Payer: Self-pay | Admitting: Cardiology

## 2010-12-02 NOTE — Telephone Encounter (Signed)
LOV faxed to [facility] @ [phone] 12/02/10/KM

## 2010-12-16 ENCOUNTER — Ambulatory Visit (HOSPITAL_COMMUNITY)
Admission: RE | Admit: 2010-12-16 | Discharge: 2010-12-16 | Disposition: A | Discharge status: Home / Self Care | Payer: BC Managed Care – PPO | Source: Ambulatory Visit | Attending: Specialist | Admitting: Specialist

## 2010-12-16 DIAGNOSIS — M7989 Other specified soft tissue disorders: Secondary | ICD-10-CM | POA: Insufficient documentation

## 2010-12-16 DIAGNOSIS — M79609 Pain in unspecified limb: Secondary | ICD-10-CM | POA: Insufficient documentation

## 2010-12-23 NOTE — Assessment & Plan Note (Signed)
[facility]                            CARDIOLOGY OFFICE NOTE   NAME:[name] [name], [name] [name]                   MRN:          [id number]  DATE:05/30/2008                            DOB:          Dec 26, 1945    Mr. Dismore is a very pleasant gentleman who has a history of coronary  artery disease dating back to 1998 when he had a myocardial infarction.  He subsequently underwent minimally invasive LIMA to his LAD.  Most  recent Myoview was performed on February 25, 2007.  At time, his ejection  fraction was 41%.  There is a large anteroapical infarct with small area  of inferobasal ischemia.  We have been treating this medically.  An  echocardiogram on February 25, 2007, showed an ejection fraction of 40-45%.  There was mild mitral regurgitation as well as tricuspid regurgitation.  Since I last saw him, he is doing well from symptomatic standpoint.  There is no dyspnea, chest pain, palpitations, or syncope.  There is no  pedal edema.   MEDICATIONS:  1. Diovan 160 mg p.o. daily.  2. Aspirin 325 mg p.o. daily.  3. Fish oil.  4. Crestor 20 mg p.o. daily.  5. Toprol 25 mg p.o. daily.  6. Nexium 40 mg p.o. daily.  7. Vitamin B.   PHYSICAL EXAMINATION:  VITAL SIGNS:  Today, shows a blood pressure of  125/74 and his pulse is 61.  Weighs 139 pounds.  HEENT:  Normal.  NECK:  Supple with no bruits.  CHEST:  Clear.  CARDIOVASCULAR:  Regular rate.  ABDOMEN:  No tenderness.  EXTREMITIES:  No edema.   His electrocardiogram shows a sinus rhythm at a rate of 55.  There is a  prior anterior infarct with T-wave inversion, which is unchanged.   DIAGNOSES:  1. Coronary artery disease - the patient is not having chest pain or      shortness of breath.  His last Myoview was felt to be low risk.  We      will continue his medical therapy including his aspirin, beta-      blocker, statin, and ARB.  He has asked about changing to an ACE      inhibitor, but I think he has had a  cough with ACE inhibitors in      the past.  We could certainly try this in the future if he wishes      to proceed.  For now, we will continue with the same.  2. History of ischemic cardiomyopathy - as per above.  3. Hypertension - his blood pressure is adequately controlled.  I will      check a BMET given his ARB use.  4. Hyperlipidemia - he will continue on his present medications and      this should be followed in our Lipid Clinic.   He will continue with diet and exercise, and I will see him back in 12  months.     Madolyn Frieze Jens Som, MD, [facility]  Electronically Signed    BSC/MedQ  DD: 05/30/2008  DT: 05/30/2008  Job #: [id number]

## 2010-12-23 NOTE — Assessment & Plan Note (Signed)
[facility]                               LIPID CLINIC NOTE   NAME:[name] [name], [name] [name]                   MRN:          [id number]  DATE:12/27/2006                            DOB:          10-Jan-1946    The patient is seen in the Lipid Clinic for further evaluation and  medication titration associated with his hyperlipidemia.  He states that  he discontinued his Welchol and Zetia due to fatigue and lethargy  feelings that he associates with his statins and other cholesterol  lowering therapies.  He states that he continues to eat a bowl of Smart  Start cereal, grits, or oatmeal with 2% milk for breakfast.  He does not  eat lunch.  He snacks on Pop-Tarts, crackers, or canned fruit in syrups.  Dinners include beans and bread, steak on weekends, pork chops, half of  a bell pepper.  He eats diet cakes, cookies, and candy bars with nuts.  He states that he has been compliant with avoiding tobacco since 30  years ago when he quit smoking.  He does consume 2-3 sodas a day.  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   PAST MEDICAL HISTORY:  1. Hyperlipidemia.  2. Documented coronary disease.   CURRENT MEDICATIONS:  Have been updated on the chart.   PHYSICAL EXAMINATION:  Weight today is 232 pounds, blood pressure is  120/82, heart rate of 66.   Labs on Dec 13, 2006 revealed total cholesterol 239, triglycerides 122,  HDL 34.1, and LDL increased to 169.4.  LFTs are within normal limits  excepting a total bilirubin of 1.3.   ASSESSMENT:  The patient has not been compliant with his therapy.  The  symptoms he describes are really more associated with general fatigue  and deconditioning, as compared with statin or cholesterol lowering  therapies.  The patient refuses medications for now.  We have stressed  the importance of healthy fats and oils.  We recommended changing over  to olive oil, eating nuts instead of crackers, lowering his overall  dietary  intake and exercising through walking with his wife or other  exercises such as working in the yard, stationary bicycling, or mowing  with push mower.  At this time, as the patient continues to have  shortness of breath with short distances, I am concerned that he may  have some preclaudication-type symptoms developing, so, we will continue  to work with him and with Dr. Jens Som to find therapies that he can  tolerate.  In the meantime I would also wonder if based on his history  of snoring loudly at night and discontinuation of breathing as reported  by his wife, that he may require workup for  sleep apnea.  I will defer to Dr. Jens Som on this.  He will contact us  with questions or problems in the meantime.      Shelby Dubin, PharmD, BCPS, CPP  Electronically Signed      Rollene Rotunda, MD, [facility]  Electronically Signed   MP/MedQ  DD: 12/31/2006  DT: 12/31/2006  Job #: [id number]  cc:   Madolyn Frieze. Jens Som, MD, [facility]

## 2010-12-23 NOTE — Assessment & Plan Note (Signed)
[facility]                               LIPID CLINIC NOTE   NAME:[name] [name] KRUPINSKI                   MRN:          [id number]  DATE:08/29/2007                            DOB:          09-Aug-1946    Patient seen back in the lipid clinic for further evaluation, medication  titration.  He has hyperlipidemia in the setting of documented coronary  disease.  Patient has been feeling and doing well overall.  He has not  been able to tolerate statins in the past due to leg pain; however, he  verbalizes today that he continues to have his leg pain that is  unchanged as to whether or not he is taking these medications.   He has a past medical history pertinent for hypercholesterolemia and  documented coronary disease.  The patient's diet is continuing in its  standard fashion, and his exercise continues to be limited.   CURRENT MEDICATIONS:  1. External carotid artery  325 mg daily.  2. Diovan 160 mg daily.  3. Toprol XL 25 mg daily.  4. Fish oil daily in a 2 gm dose.  5. Vytorin 10/40 mg daily and at bedtime.   REVIEW OF SYSTEMS:  As stated in the HPI, otherwise negative.   Labs on August 24, 2007 reveal normal LFTs, total cholesterol 155,  triglycerides 88, HDL 35.3, LDL 102.   PHYSICAL EXAMINATION:  Weight today is 236 pounds.  Blood pressure is  120/74.  Heart rate is 64.  Respirations are 16.   ASSESSMENT:  Patient is seen for secondary reduction of hyperlipidemia  for secondary prevention.  His LDL is not at goal value of less than 70.   PLAN:  Have the patient continue his Vytorin 10/40 at this time, and I  have asked him to switch to Crestor 20 mg daily.  The patient verbalizes  that he understands this and will not take the two  medicines together.  He will call with questions or problems in the  meantime.  A followup is scheduled in 8-12 weeks, on the 19th of March.      [name] [name], PharmD, BCPS, CPP  Electronically  Signed      [name] Rotunda, MD, [facility]  Electronically Signed   MP/MedQ  DD: 09/08/2007  DT: 09/08/2007  Job #: [id number]   cc:   [name] [name]. [name] Som, MD, [facility]

## 2010-12-23 NOTE — Assessment & Plan Note (Signed)
[facility]                               LIPID CLINIC NOTE   NAME:[name] [name], [name] [name]                   MRN:          [id number]  DATE:05/02/2007                            DOB:          1946-08-07    Mr. Gum is seen back in the lipid clinic for further evaluation and  medication titration associated with his hyperlipidemia.  Mr. Emberton  states that he has not been taking anything for his cholesterol for the  past 3-4 months.  He continues, of note, to have leg pain that is  unchanged from his pain that he has in the past associated with his  statin.  He came back to lipid clinic today because he said that he  realizes that this is not associated with this medication class.  He has  a past medical history that is pertinent for coronary disease, status  post inferior myocardial infarction and a minimally invasive bypass  grafting with LIMA to the LAD.  His nuclear study in July 2007 revealed  43% ejection fraction.  He has hypertension and hyperlipidemia  additionally.  The patient's diet continues to be a limiting factor, as  he continues to drive a truck.  He does not exercise regularly due to  very significant leg pain.   CURRENT MEDICATIONS:  1. Enteric-coated aspirin 325 mg daily.  2. Diovan 160 mg daily.  3. Toprol XL 25 mg daily.  4. Fish oil, the patient discontinued several weeks ago.  5. Nexium 40 mg daily.  6. Fluticasone 2 sprays in each nostril daily q.h.s.   REVIEW OF SYSTEMS:  Weight today 232 pounds, blood pressure 117/80,  heart rate is 68, respirations are 16.   LABS:  LFTs within normal limits and in May 2008, total bilirubin  slightly elevated at 1.3, total cholesterol 239, triglycerides 122, HDL  34.1, LDL direct 169.4.   ASSESSMENT:  Mr. Abdallah has not tolerated multiple medications in the  past; however, it appears that he now understands that this pain may not  be associated with statin, as we have discussed  multiple times based on  my notes in the past but due to some other leg-related issue such as  claudication.  He certainly has risk factors for this including his  longstanding coronary disease.  We will ask Dr. Jens Som if further  workup for this is indicated.  We will defer this to him and his  judgment.  We will ask the patient to begin back on the statin  medication he most recently took which is Lipitor 10/40.  He will take 1  tablet every other day or every 3 days and call me back within 2 weeks  to let me know how he is tolerating this therapy.  I am hopeful that he  will be able to tolerate some level of something.  He will consider  taking Co-Enzyme-Q-10 in  addition, though I am not confident his is going to help him.  Time  spent with the patient is 35 minutes.      Shelby Dubin, PharmD, BCPS, CPP  Electronically Signed  Rollene Rotunda, MD, [facility]  Electronically Signed   MP/MedQ  DD: 05/12/2007  DT: 05/13/2007  Job #: [id number]   cc:   Madolyn Frieze. Jens Som, MD, [facility]

## 2010-12-23 NOTE — Assessment & Plan Note (Signed)
[facility]                               LIPID CLINIC NOTE   NAME:[name] [name], [name] [name]                   MRN:          [id number]  DATE:02/20/2008                            DOB:          18-May-1946    The patient is seen in the Lipid Clinic for further evaluation and  medication titration associated with his hyperlipidemia in the setting  of documented coronary artery disease.  The patient has had restless leg  pain.  He has been compliant with his therapies and has been taking his  Crestor 20 mg each day on a regular basis.  The patient states he has  had increasing exercise tolerance and has been working on to continue  this positive activity.  He has past medical history pertinent for  documented coronary artery disease, hyperlipidemia, and hypertension.   CURRENT MEDICATIONS:  1. Enteric-coated aspirin 325 mg daily.  2. Diovan 160 mg daily.  3. Toprol-XL 25 mg daily.   The patient discontinued his Nexium.  He changed that over to only  p.r.n. 40 mg a day as needed.  He discontinued his fluticasone, and he  takes 2 g of fish oil daily.  He takes Crestor 20 mg daily.   REVIEW OF SYSTEMS:  As stated in the HPI and otherwise negative.   PHYSICAL EXAMINATION:  Weight today in the office is 234 pounds, blood  pressure is 110/58, respirations are 16, and heart rate is 62.   LABORATORY DATA:  Labs have been appended to the chart.   The patient is doing well and compliance with his therapy.  I have  congratulated him on his good work.  He will continue to work on  increasing his exercise.  We will make no changes and followup with him  after he sees Dr. Jens Som in late August.  He will call with questions  or problems and with medication compliance issues.   I appreciate the opportunity to see this pleasant patient.      Shelby Dubin, PharmD, BCPS, CPP  Electronically Signed      Madolyn Frieze. Jens Som, MD, [facility]  Electronically Signed   MP/MedQ  DD: 03/15/2008  DT: 03/16/2008  Job #: [id number]   cc:   Madolyn Frieze. Jens Som, MD, [facility]

## 2010-12-23 NOTE — Assessment & Plan Note (Signed)
[name]                            CARDIOLOGY OFFICE NOTE   NAME:[name] [name], [name] [name]                   MRN:          [id number]  DATE:01/26/2007                            DOB:          05/12/1946    [name] [name] is a very pleasant gentleman who has a history of coronary  disease. Back in 1998, the patient had an inferior myocardial  infarction. He was seen at [name] in [place],  [place] and underwent a minimally invasive lima to the LAD. His most recent  nuclear study was performed on 03/05/06. At that time, he was found to  have an ejection fraction of 43%. There was a prior anterior infarct,  but there was no ischemia. Since I last saw him, he has developed some  dyspnea. He notes this predominantly up in the sinus area. He states  that he took Claritin and DayQuil, and it began to break up, but it is  now returning. Note that there is no orthopnea, PND, or pedal edema. He  has not had chest pain, palpitations, or syncope.   CURRENT MEDICATIONS:  1. Aspirin 325 mg daily.  2. Diovan 160 mg daily.  3. Toprol 25 mg daily.  4. Fish oil.   PHYSICAL EXAMINATION:  VITAL SIGNS:  Blood pressure 130/83, pulse 73. He  weighs 228 pounds.  HEENT:  Normal.  NECK:  Supple. There are no bruits noted.  CHEST:  Clear.  CARDIOVASCULAR:  Regular rate and rhythm.  ABDOMEN:  No pulsatile masses and no bruits.  EXTREMITIES:  No edema. He has 2+ posterior tibial pulses bilaterally.   His electrocardiogram shows sinus rhythm at a rate of 70. He has a prior  anterior infarct.   DIAGNOSES:  1. Dyspnea - he attributes this to his sinuses and it did improve with      Claritin and DayQuil. I will schedule him to have an echocardiogram      to quantify his left ventricular function. If it is unchanged, in      the 40% range, then we will not proceed with further cardiac      workup. Note that he had a Myoview in July 2007 that showed no  ischemia. We will also check a BNP and a BMET. If his BNP is normal      then I think that the chances of his dyspnea being cardiac are very      small. He states that he would then see an ENT physician for      further recommendations concerning possible sinusitis.  2. Coronary artery disease status post coronary bypassing graft - he      has not had any chest pain and his Myoview showed no ischemia in      July 2007. We will continue with his aspirin, Diovan, and Toprol.      He has not tolerated statins in the past.  3. Ischemic cardiomyopathy - He will continue on his beta blocker and      his Diovan.  4. Hypertension - his blood pressure is well controlled.  5. Hyperlipidemia - he  has been tried on Lipitor, Zocor, Crestor,      Vytorin, Welchol, and Zetia and has tolerated none of these. He      will therefore continue with his fish oil and diet.  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   We will see him back in one year.     [name] Frieze Jens Som, MD, [name]  Electronically Signed    BSC/MedQ  DD: 01/26/2007  DT: 01/27/2007  Job #: [id number]

## 2010-12-23 NOTE — Assessment & Plan Note (Signed)
[facility]                               LIPID CLINIC NOTE   NAME:[name] [name]                   MRN:          [id number]  DATE:08/23/2008                            DOB:          October 03, 1945    HISTORY OF PRESENT ILLNESS:  [name] comes in today for followup of  his hyperlipidemia therapy, which includes Crestor 20 mg every other day  and fish oil 1 g twice daily.  He has been compliant with both of these.   MEDICATIONS:  He recently decreased the Crestor from 20 mg daily to 20  mg every other day because of myopathies, which he says completely  resolved after decreasing the dose.  Other medications have not changed;  they include Diovan, aspirin 325, Toprol XL, Nexium, and vitamin D.   PHYSICAL EXAMINATION:  Weight is 244 pounds, blood pressure is 120/80.  We did not measure the heart rate.   LABORATORY DATA:  Total cholesterol 171, triglycerides 95, HDL 41.7, LDL  110.  LFTs are within normal limits.   ASSESSMENT:  His triglycerides are at goal.  His HDL has improved from  last visit, but is not quite at the goal, greater than 45.  His LDL has  risen from 73 to 409 and is now farther from the goal, being less than  70, probably in response to his reduction in Crestor.  He has been  continuing to try to follow a heart-healthy diet.  He is on the road,  lot of truck driving.  When he eats lunch at a fast food, he tries to  get grilled chicken sandwich and rice.  Eats a lot of Venison at home,  which he grills.  He does drink a regular Dr. Pat Kocher sodas.  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  [name] [name] had lots of questions  about over-the-counter medications that could be used to lower  cholesterol, although he had trouble remembering the names.  He  remembered Garlic.  I told him  that we did not have a lot of data  showing that these would be effective, but the chance of drug  interactions is pretty low, and it would probably be okay to try one of  those.  We have mentioned flaxseed oil as an option in addition Garlic.  I did make a point of telling him to avoid red yeast rice as this does  contains fluid statin.  We would not want another statin on the top of  Crestor that he is already taking.   PLAN:  Even though the LDL is a little bit worse, we are going to  continue with the same medications for now.  I gave him a handout about  diet.  I suggested almonds, walnuts, or peanuts in modulation and snack.  I asked him to change his soda intake to a diet soda of some sort if at  all possible.  I asked him to increase his treadmill, use 20-30 minutes  per night or every other night.  He is going to follow up with [place] in 4  months with a repeat liver and lipid panel, and he is encouraged to call  us with any questions or concerns in the meantime.  In the future, we  may consider adding Niaspan at a low dose by adding another additional  prescription agent, he is trying to avoid right now.      Charolotte Eke, PharmD  Electronically Signed      Rollene Rotunda, MD, [facility]  Electronically Signed   TP/MedQ  DD: 08/23/2008  DT: 08/24/2008  Job #: [id number]

## 2010-12-23 NOTE — Assessment & Plan Note (Signed)
[facility]                               LIPID CLINIC NOTE   NAME:[name] [name], [name] [name]                   MRN:          [id number]  DATE:10/24/2007                            DOB:          Jul 12, 1946    [name] [name] is seen in the lipid clinic for further evaluation of my  medication.  Titration is ACO with this hyperlipidemia in the setting of  documented coronary disease.  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  He has had improvement in his  symptomatology by using his stationary bicycle.  He has had no muscle  aches, pains, weakness, fatigue or other problems taking his current  therapy of Crestor 20 mg daily which we had switched to approximately  six weeks ago.  He has been compliant with that therapy, but we did run  out two days ago, and he has tolerated it well.  Diet therapy includes  that he continues to watch his portions though maybe not as well as he  should.  He avoids tobacco and alcohol on a regular basis.  He has been  using his exercise bicycle on a daily basis after driving, and that has  made significant improvements in his symptom of leg pain.   PAST MEDICAL HISTORY:  Pertinent for documented coronary disease status  post inferior wall myocardial infarction.  He had minimally invasive  LIMA to the LAD at  [facility] while in [place].  He has a slightly  reduced ejection fraction overall, ischemic cardiomyopathy,  hypertension.   CURRENT MEDICATIONS:  Include:  1. Enteric-coated aspirin at 325 mg daily.  2. Diabeta 160 mg daily.  3. Toprol XL 25 mg daily.  4. Fluticasone 2 sprays in each nostril daily at bedtime.  5. Fish oil 2 g daily.  6. Crestor 20 mg daily.   REVIEW OF SYSTEMS:  As stated in the HPI, otherwise negative.   PLEASE NOTE THAT PATIENT  ALLERGIES INCLUDE INTOLERANCES TO MANY STATINS.   Labs obtained on October 19, 2007, revealed a total cholesterol of 142,  triglyceride of 87, HDL 38.7, LDL 86.  LFTs are within normal limits.   PHYSICAL EXAMINATION:  Weight today is 236 pounds, blood pressure is  122/76, heart rate is 64.   ASSESSMENT:  The patient is tolerating Crestor 20 mg daily without any  issue or problem.  He is feeling well and doing well.  At this time, we  will have the patient continue to follow on a low-cholesterol diet,  continue to watch portion size and work to decrease his weight.  He will  work to increase his exercise to 15 to 20 minutes today on his  stationary bicycle, and he will follow up in the [place] in a week to  determine what they will cover from his medication standpoint as he has  just recently been notified to report there for his meds.  Will have  the  patient follow up by telephone in a week after he sees the [place] and he  will also follow up here with labs and a visit on June 29 at 4:00 p.m.   I appreciate the opportunity to see this patient.      Shelby Dubin, PharmD, BCPS, CPP  Electronically Signed      Rollene Rotunda, MD, [facility]  Electronically Signed   MP/MedQ  DD: 10/24/2007  DT: 10/24/2007  Job #: [id number]   cc:   Madolyn Frieze. Jens Som, MD, [facility]  Rollene Rotunda, MD, [facility]

## 2010-12-25 ENCOUNTER — Other Ambulatory Visit (INDEPENDENT_AMBULATORY_CARE_PROVIDER_SITE_OTHER): Payer: BC Managed Care – PPO

## 2010-12-25 DIAGNOSIS — Z79899 Other long term (current) drug therapy: Secondary | ICD-10-CM | POA: Insufficient documentation

## 2010-12-25 DIAGNOSIS — E785 Hyperlipidemia, unspecified: Secondary | ICD-10-CM

## 2010-12-25 LAB — LIPID PANEL
Cholesterol: 241 mg/dL — ABNORMAL HIGH (ref 0–200)
HDL: 43.9 mg/dL (ref >39.00)

## 2010-12-25 LAB — HEPATIC FUNCTION PANEL
ALT: 18 U/L (ref 0–53)
Albumin: 3.8 g/dL (ref 3.5–5.2)
Alkaline Phosphatase: 43 U/L (ref 39–117)
Bilirubin, Direct: 0.1 mg/dL (ref 0.0–0.3)
Total Protein: 6.7 g/dL (ref 6.0–8.3)

## 2010-12-26 NOTE — Assessment & Plan Note (Signed)
[facility]                              CARDIOLOGY OFFICE NOTE   NAME:[name], [name] FESTER                   MRN:          [id number]  DATE:05/10/2006                            DOB:          [birth year]    REFERRING PHYSICIAN:  Madolyn Frieze. Jens Som, MD, [facility]   LIPID CLINIC FOLLOWUP OFFICE NOTE.   SUPERVISING PHYSICIAN:  [name] Rotunda, MD   The patient is seen back in lipid clinic for further evaluation of  medication and titration associated with his intolerance to statins and  documented coronary disease.  [name] is well known to me.  He has had  no chest pain or shortness of breath. He has had no swelling.  He has not  been on any cholesterol lower medications since his last visit with Dr.  Jens [name].  He states that Vytorin, most recently caused significant muscle  aches and pains.  He had lab work drawn which revealed significant elevation  in his LDL cholesterol.  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  He admits to eating out a number of  meals each week.  He has not been exercising due to his significant physical  requirements at work.   The patient's past statin utilization includes Lipitor, Crestor, Zocor,  Vytorin with component of Zocor without success in avoidance of muscle aches  and pains.  The patient's had some creatinine clearance, creatinine kinase,  elevations associated with various statins after a long period of  utilization though it was very difficult to track these to the statin.  The  patient has been taking Zetia alone and this has not caused most likely any  pains, problems, or fatigue at this point.   PAST MEDICAL HISTORY:  Pertinent for documented coronary disease.  Hypertension.   CURRENT MEDICATIONS INCLUDE:  1. Enteric coated aspirin 325 mg daily.  2. Diovan 160 mg daily.  3. Toprol XL 25 mg daily.  4. Coenzyme 10 daily.  5. Zetia 10 mg daily.   REVIEW  OF SYSTEMS:  As stated in the HPI; and otherwise negative.   PHYSICAL EXAMINATION:  Weight today is 235 pounds.  Blood pressure is  134/78.  Heart rate is 64.   LABS:  Revealed total cholesterol on September 17 of 214, triglycerides 134,  HDL 40, LDL 147, LFTs within normal limits.   ASSESSMENT:  The patient has documented coronary disease and has not been  able to tolerate statins in the past.  The patient has been maintained on  Zetia with some improvement in his overall panel from an LDL high of 171,  that is still not optimal.  Based on a long discussion with the patient will  begin Welchol 6 tablets daily divided into 2 doses and continue Zetia as  well.  He will call back with questions or problems.  In the meantime he  will have labs checked in 6 weeks and a followup appointment in 7 weeks.  He  will let us know if he has any trouble tolerating this.  He  has been  cautioned to take it with a full glass of water so that he does not become  constipated which is a major side effect of this therapy.  The patient has  been given samples and a prescription.      ______________________________  [name] [name], PharmD, BCPS    ______________________________  [name] Rotunda, MD, [facility]    MP/MedQ  DD:  05/11/2006  DT:  05/12/2006  Job #:  [id number]   cc:   [name] Frieze. Jens Som, MD, [facility]

## 2010-12-26 NOTE — Assessment & Plan Note (Signed)
 [facility]                              CARDIOLOGY OFFICE NOTE   NAME:[name] [name], [name] [name]                   MRN:          [id number]  DATE:03/12/2006                            DOB:          [birth year]    Mr. Lawhorn is a pleasant gentleman who has a history of coronary artery  disease status post coronary artery bypass graft.  He also has a history of  cough with ACE inhibition.  Since I last saw him, he is doing well.  There  is no dyspnea, chest pain, no palpitations or syncope.  He has had to  discontinue his Vytorin as it was causing significant myalgias and weakness.   MEDICATIONS:  At present include:  1.  Aspirin 325 mg p.o. daily.  2.  Diovan 160 mg p.o. daily.  3.  Toprol 25 mg p.o. daily.  4.  Coenzyme Q.   PHYSICAL EXAMINATION:  VITAL SIGNS:  Show a blood pressure of 130/84 and his  pulse is 68.  He weighs 234 pounds.  NECK:  Supple with no bruits.  CHEST:  Clear.  CARDIOVASCULAR:  Reveals a regular rate and rhythm.  EXTREMITIES:  Show no edema.   His recent nuclear study showed a prior distal anterior and apical infarct  but no ischemia.  His ejection fraction was 43%.  His most recent lipids  showed a total cholesterol of 245 with an LDL of 171 and an HDL of 33.8.   DIAGNOSES:  1.  Coronary artery disease, status post coronary artery bypass graft.  2.  Ischemic cardiomyopathy.  3.  Hypertension.  4.  Hyperlipidemia.   PLAN:  Mr. Pfiffner is doing well from a symptomatic standpoint and his  nuclear study showed infarct but no ischemia and an ejection fraction of  43%.  We will, therefore, continue with medical therapy.  Note, he has had  myalgias with Lipitor, Zocor, Crestor, and now Vytorin.  His cholesterol  remains significant elevated.  I will add Zetia 10 mg p.o. daily as well as  fish oil.  We will check lipids and liver in 6 weeks and adjust as  indicated.  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                              Madolyn Frieze Jens Som, MD, [facility]    BSC/MedQ  DD:  03/12/2006  DT:  03/12/2006  Job #:  [id number]   cc:   Derenda Mis

## 2010-12-26 NOTE — Assessment & Plan Note (Signed)
[facility]                                 LIPID CLINIC NOTE   NAME:[name] [name], [name] [name]                   MRN:          [id number]  DATE:06/24/2006                            DOB:          1945/10/26    PAST MEDICAL HISTORY:  1. Hyperlipidemia.  2. Coronary artery disease, status post acute myocardial infarction in      1998.  Ejection fraction 43%.  3. Hyperlipidemia.   MEDICATIONS:  1. Aspirin 325 mg daily.  2. Diovan 160 mg daily.  3. Toprol 25 mg daily.  4. Zetia 10 mg daily.  5. Welchol 175 mg 3 tablets twice daily.   PHYSICAL EXAMINATION:  Weight 232 pounds.  Blood pressure 124/78, heart rate  60.   LABORATORY DATA:  Total cholesterol is 185, triglycerides 85, HDL 32, LDL  136.  LFTs within normal limits.   ASSESSMENT:  [name] [name] is a pleasant 65 year old male who returns to the  Lipid Clinic today with no chest pain, no shortness of breath, no muscle  aches or pain.  He is compliant with current medication regimen.  His total  cholesterol is a goal of less than 200, triglycerides goal of less than 150,  HDL lesser goal of greater than 40 and significantly decreased since last  visit.  LDL greater than goal of less than 100 and significantly improved  since last visit from 147 to 136.  He is compliant with his Zetia and his  Welchol; however, he is intolerant to statin medications in the past.  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  His  stops are mostly at [employer] and [employer].  He said that he has started  trying to pick the grilled option at [employer] instead of the fried chicken  but he says not all [employer] have this option.  He also is eating some  salads from [employer] instead of having fried foods there.  He has also  started packing his lunch and breakfast with some fruit and oatmeal,  raisins, other snacks that are healthier alternatives to what he had  previously been doing.   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  He says  that he is home for dinner.  He takes a shower and goes to bed and is out  again about 1 o'clock to 2 o'clock in the morning.  He does this five days a  week.  He says he is too tired during those days to do any exercise;  however, Saturdays and Sundays he is agreeable to slowly begin an exercise  regimen of riding his exercise bike.  He says that he cannot do 30 minutes  at a time that his legs are too weak but he is willing to put the bike in  front of the TV, start pedaling during the commercials, take a break during  the show and as he improves and has more strength to switch that and pedal  during the show and take a break during the commercials until he is able to  pedal  for the full 30 minutes of a TV show.  If he can begin this two days a  week when he is off, it is at least better than what he is currently doing  as of nothing.   PLAN:  1. Continue current medications.  2. Decrease fats in diet.  3. Begin exercise regimen.  4. Follow-up visit in six months for lipid panel and LFTs.  5. Make adjustments needed at that time.      Leota Sauers, PharmD  Electronically Signed      Jesse Sans. Daleen Squibb, MD, [facility]  Electronically Signed   LC/MedQ  DD: 06/24/2006  DT: 06/24/2006  Job #: [id number]

## 2010-12-31 NOTE — Progress Notes (Signed)
Appt. 5/24 in lipid clinic.

## 2011-01-01 ENCOUNTER — Ambulatory Visit (INDEPENDENT_AMBULATORY_CARE_PROVIDER_SITE_OTHER): Payer: BC Managed Care – PPO

## 2011-01-01 VITALS — BP 118/82 | Wt 233.0 lb

## 2011-01-01 DIAGNOSIS — E785 Hyperlipidemia, unspecified: Secondary | ICD-10-CM

## 2011-01-01 NOTE — Progress Notes (Signed)
Mr. Stegall returns today for follow-up of dyslipidemia.  Current lipid regimen includes Slo Niacin 500 mg qHS, and Fish Oil 4,000 daily.  Since his last follow-up he has discontinues Zetia due to dizziness and "drunk" feeling which he reports resolved after discontinuing zetia.  He reports mild flushing that could be associated with Slo Niacin, but this is tolerable.  He denies any other adverse events and also denies fatigue, shortness of breath, chest pain, or muscle aches.  Pt reports compliance with medications, missing a dose only occasionally.   With regard to medications, pt has a history of statin intolerance and has tried multiple statins in the past with no success.  He has also been on alternate day dosing with no success.  [facility] started Slo Niacin and pt has been on this for approximately 6 months.    Review of dietary compliance reveals that pt is not adhering to a heart healthy diet and has much room for improvement.  Diet is as follows: Breakfast - egg sandwich on toast with mayo and coffee to drink, occasionally has Cheerios with 1% milk, or oatmeal with sugar, milk, and margarine.   Mid-morning snack - Snack cakes or blueberry muffins (which pt thought was healthy bc it had blueberries) and a regular Dr. Reino Kent almost every day Lunch - skips lunch Dinner - wife cooks most days of the week and they occasionally go out to eat on the weekends.  She cooks mostly chicken, but also beef and pork.  She usually prepares at least one vegetable and another side such as mac 'n cheese.  They have a salad about three times per week.  Patient uses ranch dressing on his salads.  Patient will consider switching to a oil/vinegar based dressing as he has been told by some friends of his that vinegar lowers cholesterol. Evening snack - occasional icecream.  Exercise - no routine exercise at this time.  Patient has not been able to exercise since last spring due to knee problems.  He recently had  knee surgery to remove and repair damaged cartilage in his knee.  He has been going to physical therapy and has recently been released to walk on the treadmill.  He attends physical therapy twice weekly.   Patient is a former smoker and quit years ago. He does drink occasionally when he goes out to eat.

## 2011-01-01 NOTE — Assessment & Plan Note (Signed)
Lipid values are as follows:  TC 241 (previously 214, goal <200), TG 137 (previously 157, goal <150), HDL 43.9 (previously 31.5, goal >40), LDL 195 (previously 150, goal <70).  His LDL goal is <70 due to previous MI and CABG.  He is far from goal and has not been able to reach goal due to statin intolerance.  He has tried multiple statins; however, at next follow-up we could try pravastatin or new Livalo on a twice per week schedule as a trial.  We did not attempt that this time because patient was weary of adverse events and has set some lifestyle goals to work on.  I do not foresee the lifestyle changes being enough to get him to goal, but we will try.  Diet is less than optimal.  It is difficult for the patient to eat appropriately due to his work schedule, but he has agreed to make some small changes.  He is also willing to begin exercising again now that he has been released to use the treadmill.    Plan: 1)  Continue current medications (Slo Niacin and Fish Oil) 2)  Will consider pravastatin or livalo at a low dose twice weekly at next visit (I have discussed this with patient) 3)  Attempt to limit snack cakes and make healthier snack options including fresh fruits and vegetables, unsalted almonds or walnuts.   4)  Work on portion control 5)  Attempt to replace one Dr. Reino Kent per day with a bottle of water 6)  Exercise three times per week, twice at physical therapy and once at home each week as tolerated by knee 7)  Follow-up in three months

## 2011-01-01 NOTE — Patient Instructions (Addendum)
1)  Continue current medications 2)  Attempt to improve diet with the following goals: Limit mid-morning snacks or choose healthier snacks, like fiber bars or fresh fruit/vegetables or unsalted almonds or walnuts Work on portion control and choose smaller steak size when eating out Try to replace one Dr. Reino Kent per day with a bottle of water 3)  Attempt to exercise three times per week, twice at therapy and once at home, try to walk for at least 10 minutes or as knee tolerates 4)  Follow-up in 3 months Lipid Clinic on Thursday, August 23rd @ 4:00 pm Labwork at [place] office on Monday, August 20th (FASTING)

## 2011-03-25 ENCOUNTER — Ambulatory Visit: Payer: BC Managed Care – PPO

## 2011-03-25 DIAGNOSIS — Z79899 Other long term (current) drug therapy: Secondary | ICD-10-CM

## 2011-03-25 DIAGNOSIS — E7800 Pure hypercholesterolemia, unspecified: Secondary | ICD-10-CM

## 2011-03-25 LAB — HEPATIC FUNCTION PANEL
AST: 18 U/L (ref 0–37)
Albumin: 4.1 g/dL (ref 3.5–5.2)
Total Bilirubin: 1.1 mg/dL (ref 0.3–1.2)

## 2011-03-25 LAB — LIPID PANEL
Cholesterol: 238 mg/dL — ABNORMAL HIGH (ref 0–200)
HDL: 49.7 mg/dL (ref >39.00)
Total CHOL/HDL Ratio: 5
Triglycerides: 87 mg/dL (ref 0.0–149.0)
VLDL: 17.4 mg/dL (ref 0.0–40.0)

## 2011-03-25 LAB — LDL CHOLESTEROL, DIRECT: Direct LDL: 182.2 mg/dL

## 2011-04-02 ENCOUNTER — Ambulatory Visit (INDEPENDENT_AMBULATORY_CARE_PROVIDER_SITE_OTHER): Payer: BC Managed Care – PPO

## 2011-04-02 VITALS — BP 108/82

## 2011-04-02 DIAGNOSIS — E785 Hyperlipidemia, unspecified: Secondary | ICD-10-CM

## 2011-04-02 NOTE — Patient Instructions (Signed)
1)  Continue Slo Niacin and Fish Oil 2)  Start taking Fish Oil with breakfast and lunch (rather than supper) 3)  Start Crestor 5 mg three times per week (Monday, Wednesday, Friday) 4)  Continue working on diet and limiting fatty foods 5)  Exercise as tolerated 6)  Return to clinic in 6 weeks  Lipid Clinic: Thursday, October 4th @ 4:00 pm Have labwork drawn at [facility] 2-3 days before appt.  Ask them to send results to Weston Brass, PharmD in Lipid Clinic on church [address]

## 2011-04-02 NOTE — Assessment & Plan Note (Addendum)
Lipid values are as follows:  TC 238 (previously 241, goal <200), TG 87 (previously 137, goal <150), HDL 49.7 (previously 43.9, goal >40), LDL 182.2 (previously 195, goal <70).  His LDL goal is <70 due to previous MI and CABG.  He is far from goal and has not been able to reach goal due to statin intolerance.  He is willing to try Crestor once more.  Diet continues to be less than optimal and patient will continue to work on this.  It is difficult for the patient to eat appropriately due to his work schedule, but he has agreed to make some small changes.  Unfortunately he is unable to exercise at this time due to poor recovery after knee surgery. See below for plan.   Plan: 1)  Continue current medications (Slo Niacin and Fish Oil) 2)  Consider moving Fish Oil to morning and lunch time dose (vs evening) 3)  Start Crestor 5 mg on Monday, Wednesday, and Friday, #28 samples given  4)  Continue to work on improving diet   6)  Exercise as tolerated by knee 7)  Follow-up in 6 weeks.   *Note:  If patient tolerates Crestor, will need more at next visit (samples or a Rx)

## 2011-04-02 NOTE — Progress Notes (Signed)
[name] returns today for 3 mo follow-up of dyslipidemia.  Current lipid regimen includes Slo Niacin 500 mg qHS, and Fish Oil 4,000 daily.  He reports a clear fluid-like substance that gets caught in his throat in the mornings.  He feels this is associated with the Fish Oil, because if he forgets the fish oil, he does not notice this in his throat.  He denies any fishy taste or belching.  He denies any other adverse events and also denies fatigue, shortness of breath, chest pain, or muscle aches.  Pt has a history of statin intolerance and has tried multiple statins in the past with no success.  He is willing to try a statin again if absolutely needed.     Review of dietary compliance reveals that pt is not adhering to a heart healthy diet and has much room for improvement.  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  He continues to drink a Dr. Reino Kent occasionally.  Lunch - skips lunch Dinner - wife cooks most days of the week and they occasionally go out to eat on the weekends.  She cooks mostly chicken, but also beef and pork.  She usually prepares at least one vegetable and another side such as mac 'n cheese.  They have a salad about three times per week.  Patient uses ranch dressing on his salads.    Exercise - no routine exercise at this time.  Patient has not been able to exercise since last spring due to knee problems.  He recently had knee surgery to remove and repair damaged cartilage in his knee.  He has now finished physical therapy but is having further complications and has developed severe arthritis in his knee.  He is being treated for this arthritis, but symptoms are not controlled enough for him to tolerate exercise.    Patient is a former  smoker and quit years ago. He does drink occasionally when he goes out to eat.   Of note, patient reports some mild dizziness upon lying down.  He has an appt with Dr. Jens Som in early October and will report this to him if it continues.  It is not described as an orthostatic type dizziness as it does not occur upon going from sitting/lying to standing.

## 2011-04-30 ENCOUNTER — Other Ambulatory Visit: Payer: Self-pay | Admitting: Cardiology

## 2011-05-08 ENCOUNTER — Encounter: Payer: Self-pay | Admitting: Cardiology

## 2011-05-11 ENCOUNTER — Encounter: Payer: Self-pay | Admitting: Cardiology

## 2011-05-11 ENCOUNTER — Ambulatory Visit (INDEPENDENT_AMBULATORY_CARE_PROVIDER_SITE_OTHER): Payer: BC Managed Care – PPO | Admitting: *Deleted

## 2011-05-11 ENCOUNTER — Ambulatory Visit (INDEPENDENT_AMBULATORY_CARE_PROVIDER_SITE_OTHER): Payer: BC Managed Care – PPO | Admitting: Cardiology

## 2011-05-11 DIAGNOSIS — I10 Essential (primary) hypertension: Secondary | ICD-10-CM

## 2011-05-11 DIAGNOSIS — E7800 Pure hypercholesterolemia, unspecified: Secondary | ICD-10-CM

## 2011-05-11 DIAGNOSIS — I2510 Atherosclerotic heart disease of native coronary artery without angina pectoris: Secondary | ICD-10-CM

## 2011-05-11 MED ORDER — METOPROLOL SUCCINATE ER 25 MG PO TB24: 25.0000 mg | ORAL_TABLET | Freq: Every day | ORAL | Status: DC

## 2011-05-11 MED ORDER — VALSARTAN 160 MG PO TABS: 160.0000 mg | ORAL_TABLET | Freq: Every day | ORAL | Status: DC

## 2011-05-11 MED ORDER — NIACIN ER 500 MG PO TBCR: 500.0000 mg | EXTENDED_RELEASE_TABLET | Freq: Every day | ORAL | Status: DC

## 2011-05-11 NOTE — Assessment & Plan Note (Signed)
Continue ARB and beta blocker. 

## 2011-05-11 NOTE — Assessment & Plan Note (Signed)
Blood pressure controlled. Continue present medications. Check potassium and renal function. 

## 2011-05-11 NOTE — Assessment & Plan Note (Signed)
Continue present medications. Check lipids and liver. Now followed in the lipid clinic.

## 2011-05-11 NOTE — Progress Notes (Signed)
HPI:[name] [name] is a very pleasant gentleman who has a history of coronary artery disease dating back to 1998 when he had a myocardial infarction. He subsequently underwent minimally invasive LIMA to his LAD.  Most recent Myoview was performed in Jan 2011.  At time, his ejection fraction was 43%.  There is a large anteroapical infarct with no ischemia.  We have been treating this medically.  An echocardiogram on February 25, 2007, showed an ejection fraction of 40-45%. There was mild mitral regurgitation as well as tricuspid regurgitation. I last saw him in Sept 2011. Since then the patient has dyspnea with more extreme activities but not with routine activities. It is relieved with rest. It is not associated with chest pain. There is no orthopnea, PND or pedal edema. There is no syncope or palpitations. There is no exertional chest pain.  Current Outpatient Prescriptions  Medication Sig Dispense Refill  . aspirin 325 MG tablet Take 325 mg by mouth daily.        Marland Kitchen DIOVAN 160 MG tablet TAKE 1 TABLET ONCE DAILY  90 tablet  1  . fish oil-omega-3 fatty acids 1000 MG capsule Take 4 g by mouth daily.        . metoprolol succinate (TOPROL-XL) 25 MG 24 hr tablet TAKE 1 TABLET ONCE DAILY  90 tablet  1  . niacin (SLO-NIACIN) 500 MG tablet Take 500 mg by mouth at bedtime.        . rosuvastatin (CRESTOR) 5 MG tablet Take 5 mg by mouth daily.           Past Medical History  Diagnosis Date  . Coronary artery disease   . Hyperlipidemia   . Hypertension   . Erectile dysfunction   . Ischemic cardiomyopathy     Past Surgical History  Procedure Date  . Coronary artery bypass graft   . Penile prosthesis implant   . Thoracotomy     left anterior; wound exploration and debridement  . Cholecystectomy   . Spinal cyst     History   Social History  . Marital Status: Married    Spouse Name: N/A    Number of Children: 3  . Years of Education: N/A   Occupational History  . truck driver    Social History  Main Topics  . Smoking status: Former Smoker    Types: Cigarettes    Quit date: 05/07/1981  . Smokeless tobacco: Not on file  . Alcohol Use: Yes     rare  . Drug Use: No  . Sexually Active: Not on file   Other Topics Concern  . Not on file   Social History Narrative  . No narrative on file    ROS: no fevers or chills, productive cough, hemoptysis, dysphasia, odynophagia, melena, hematochezia, dysuria, hematuria, rash, seizure activity, orthopnea, PND, pedal edema, claudication. Remaining systems are negative.  Physical Exam: Well-developed well-nourished in no acute distress.  Skin is warm and dry.  HEENT is normal.  Neck is supple. No thyromegaly.  Chest is clear to auscultation with normal expansion.  Cardiovascular exam is regular rate and rhythm.  Abdominal exam nontender or distended. No masses palpated. Extremities show no edema. neuro grossly intact  ECG sinus bradycardia. Prior anterior infarct. Low voltage.

## 2011-05-11 NOTE — Patient Instructions (Signed)
Your physician wants you to follow-up in: one year You will receive a reminder letter in the mail two months in advance. If you don't receive a letter, please call our office to schedule the follow-up appointment.  

## 2011-05-11 NOTE — Assessment & Plan Note (Signed)
Continue aspirin and statin. 

## 2011-05-12 ENCOUNTER — Other Ambulatory Visit: Payer: BC Managed Care – PPO

## 2011-05-12 LAB — LIPID PANEL
HDL: 48.2 mg/dL (ref >39.00)
LDL Cholesterol: 120 mg/dL — ABNORMAL HIGH (ref 0–99)
Total CHOL/HDL Ratio: 4
VLDL: 20.6 mg/dL (ref 0.0–40.0)

## 2011-05-12 LAB — HEPATIC FUNCTION PANEL
ALT: 20 U/L (ref 0–53)
Bilirubin, Direct: 0.1 mg/dL (ref 0.0–0.3)
Total Bilirubin: 0.9 mg/dL (ref 0.3–1.2)

## 2011-05-13 ENCOUNTER — Encounter: Payer: Self-pay | Admitting: *Deleted

## 2011-05-14 ENCOUNTER — Ambulatory Visit: Payer: BC Managed Care – PPO

## 2011-06-17 ENCOUNTER — Telehealth: Payer: Self-pay | Admitting: Cardiology

## 2011-06-17 MED ORDER — ROSUVASTATIN CALCIUM 5 MG PO TABS: 5.0000 mg | ORAL_TABLET | Freq: Every day | ORAL | Status: DC

## 2011-06-17 NOTE — Telephone Encounter (Signed)
Pt wants refill of crestor called to [facility] [phone] opt 2

## 2011-09-01 ENCOUNTER — Other Ambulatory Visit (INDEPENDENT_AMBULATORY_CARE_PROVIDER_SITE_OTHER): Payer: BC Managed Care – PPO

## 2011-09-01 ENCOUNTER — Other Ambulatory Visit: Payer: Managed Care, Other (non HMO)

## 2011-09-01 ENCOUNTER — Other Ambulatory Visit: Payer: Self-pay | Admitting: *Deleted

## 2011-09-01 DIAGNOSIS — E7800 Pure hypercholesterolemia, unspecified: Secondary | ICD-10-CM

## 2011-09-01 LAB — HEPATIC FUNCTION PANEL
Albumin: 3.8 g/dL (ref 3.5–5.2)
Alkaline Phosphatase: 44 U/L (ref 39–117)
Total Protein: 6.9 g/dL (ref 6.0–8.3)

## 2011-09-01 LAB — LIPID PANEL
Cholesterol: 194 mg/dL (ref 0–200)
HDL: 38.1 mg/dL — ABNORMAL LOW (ref >39.00)
LDL Cholesterol: 131 mg/dL — ABNORMAL HIGH (ref 0–99)
Triglycerides: 127 mg/dL (ref 0.0–149.0)
VLDL: 25.4 mg/dL (ref 0.0–40.0)

## 2011-09-03 ENCOUNTER — Ambulatory Visit (INDEPENDENT_AMBULATORY_CARE_PROVIDER_SITE_OTHER): Payer: Managed Care, Other (non HMO) | Admitting: Pharmacist

## 2011-09-03 DIAGNOSIS — E785 Hyperlipidemia, unspecified: Secondary | ICD-10-CM

## 2011-09-03 NOTE — Assessment & Plan Note (Addendum)
Current lipid panel (09/01/11):  TC 194 (goal<200), TG 127 (goal<150), HDL 38 (goal>40), LDL 131 (goal<70 due to hx of MI and CABG).  LFTs are WNL.  Patient reports another lipid panel drawn at the [place] several weeks ago in which his labs were worse than reported today.  Compared to the last levels on our record, TG have worsened but remain in goal, HDL has decreased to below goal, and LDL has increased and remains above goal.  Given intolerance to Crestor 3 days per week, will not retry statin at this time.  Will try to maximize Niacin and if not a goal after max tolerated dose of Niacin, will add additional therapy.   Plan: 1)  Continue current medications.  Try increasing niacin (gave Niaspan samples to try to prevent the "hot spots") to 1500 mg daily.  Call with any difficulties. 2)  As able, continue walking 5-10 minutes several times daily. 3)  Continue healthy dietary choices.  Limit regular soda and fried foods. 4)  Recheck in 3 months.

## 2011-09-03 NOTE — Patient Instructions (Addendum)
Thank you for coming into lipid clinic today.   Continue to make healthy food choices like eating nuts, fish and chicken (instead of beef), fruits, and vegetables. We recommend baking or grilling your meats instead of frying. If you do fry your food, try to use olive oil or canola oil. Please try to drink more water and decrease your intake of sugary drinks like regular soda and sweet tea. Continue to increase exercising as tolerated.   Please increase your Niacin to 1500 mg once daily at bedtime. We have given you Niaspan samples today, which is a prescription form of the slo-Niacin. It is made to release the Niacin more slowly. This may help with the flushing. Continue to take your aspirin 30-45 minutes before you take your Niacin, as this should also help with your flushing.  It is ok to stop taking your Crestor.  Follow up in 3 months.

## 2011-09-03 NOTE — Progress Notes (Signed)
HPI:  [name] [name] is a 83 yoWM presenting for dyslipidemia follow up.  He is treated with fish oil 4 g daily and SloNiacin 1000 mg qHS (recent visit to [place] - instructed to increase niacin to 1000 from the previous 500 mg dose).  He had been taking Crestor 5 mg PO on MWF, but began to feel stomach knots and back pain.  He discontinued the Crestor himself for about 2-3 weeks, then tried it again for a couple doses, but had the same negative effects.  He has now been off of Crestor for 1 and 1/2 months.  He is tolerating fish oil and complains of occasional "hot spots" after taking niacin (regardless of pretreatment with ASA).  Currently he has not participated in any regular exercise due to time constraints and knee problems, but since retiring last week, he plans to get back into it.  He follows a fairly healthy diet.  For breakfast he eats cereal, grits, or eggs/bacon.  He doesn't eat a regular lunch, but does snack on nuts until suppertime.  For dinner, he eats salmon, chicken, hamburger steak, vegetables, and salads.  He and his wife try to limit the amount of frying they do.  He drinks 1 regular soda per day, as well as sweet tea, and water.  He also drinks 1/2 glass of grapefruit juice each evening.  Reviewed all medications.  Current Outpatient Prescriptions  Medication Sig Dispense Refill  . aspirin 325 MG tablet Take 325 mg by mouth daily.        . fish oil-omega-3 fatty acids 1000 MG capsule Take 2 g by mouth 2 (two) times daily.       . metoprolol succinate (TOPROL-XL) 25 MG 24 hr tablet Take 1 tablet (25 mg total) by mouth daily.  90 tablet  4  . niacin (SLO-NIACIN) 500 MG tablet Take 1 tablet (500 mg total) by mouth at bedtime.  90 tablet  4  . valsartan (DIOVAN) 160 MG tablet Take 1 tablet (160 mg total) by mouth daily.  90 tablet  4  . rosuvastatin (CRESTOR) 5 MG tablet Take 1 tablet (5 mg total) by mouth daily.  90 tablet  3   Allergies  Allergen Reactions  . Ace Inhibitors     REACTION: cough  .  Rosuvastatin     REACTION: muscle pains  . Statins     REACTION: muscle pains

## 2011-10-12 ENCOUNTER — Other Ambulatory Visit: Payer: Self-pay | Admitting: Internal Medicine

## 2011-10-12 DIAGNOSIS — E7800 Pure hypercholesterolemia, unspecified: Secondary | ICD-10-CM

## 2011-10-12 DIAGNOSIS — Z79899 Other long term (current) drug therapy: Secondary | ICD-10-CM

## 2011-10-14 ENCOUNTER — Telehealth: Payer: Self-pay

## 2011-10-14 DIAGNOSIS — M2550 Pain in unspecified joint: Secondary | ICD-10-CM | POA: Insufficient documentation

## 2011-10-14 DIAGNOSIS — Z0000 Encounter for general adult medical examination without abnormal findings: Secondary | ICD-10-CM

## 2011-10-14 NOTE — Telephone Encounter (Signed)
Pt called stating he recently changed insurance companies and now requires a referral. Pt has been seen at [facility] by Dr Thomasena Edis, his first appt 10/13/2011. Pt is requesting a referral for first and follow up appt.

## 2011-10-14 NOTE — Telephone Encounter (Signed)
Done per emr 

## 2011-10-14 NOTE — Telephone Encounter (Signed)
Put order in for lab. 

## 2011-11-03 ENCOUNTER — Other Ambulatory Visit: Payer: Self-pay | Admitting: Pharmacist

## 2011-11-03 DIAGNOSIS — E785 Hyperlipidemia, unspecified: Secondary | ICD-10-CM

## 2011-11-30 ENCOUNTER — Other Ambulatory Visit: Payer: Medicare Other

## 2011-12-03 ENCOUNTER — Ambulatory Visit: Payer: Medicare Other

## 2011-12-04 ENCOUNTER — Ambulatory Visit: Payer: Managed Care, Other (non HMO)

## 2011-12-04 DIAGNOSIS — Z0000 Encounter for general adult medical examination without abnormal findings: Secondary | ICD-10-CM

## 2011-12-04 DIAGNOSIS — E7800 Pure hypercholesterolemia, unspecified: Secondary | ICD-10-CM

## 2011-12-04 DIAGNOSIS — Z79899 Other long term (current) drug therapy: Secondary | ICD-10-CM

## 2011-12-04 LAB — HEPATIC FUNCTION PANEL
ALT: 21 U/L (ref 0–53)
AST: 19 U/L (ref 0–37)
Total Bilirubin: 0.8 mg/dL (ref 0.3–1.2)

## 2011-12-04 LAB — URINALYSIS, ROUTINE W REFLEX MICROSCOPIC
Bilirubin Urine: NEGATIVE
Hgb urine dipstick: NEGATIVE
Nitrite: NEGATIVE
Total Protein, Urine: NEGATIVE
Urine Glucose: NEGATIVE

## 2011-12-04 LAB — LIPID PANEL
HDL: 41.7 mg/dL (ref >39.00)
Total CHOL/HDL Ratio: 6
VLDL: 31 mg/dL (ref 0.0–40.0)

## 2011-12-04 LAB — CBC WITH DIFFERENTIAL/PLATELET
Basophils Absolute: 0 10*3/uL (ref 0.0–0.1)
Eosinophils Absolute: 0.2 10*3/uL (ref 0.0–0.7)
Lymphocytes Relative: 32.9 % (ref 12.0–46.0)
MCHC: 33.9 g/dL (ref 30.0–36.0)
Monocytes Relative: 7.8 % (ref 3.0–12.0)
Neutro Abs: 2.5 10*3/uL (ref 1.4–7.7)
Platelets: 137 10*3/uL — ABNORMAL LOW (ref 150.0–400.0)
RDW: 13.5 % (ref 11.5–14.6)

## 2011-12-04 LAB — BASIC METABOLIC PANEL
CO2: 29 mEq/L (ref 19–32)
Calcium: 9.1 mg/dL (ref 8.4–10.5)
Chloride: 103 mEq/L (ref 96–112)
Creatinine, Ser: 1 mg/dL (ref 0.4–1.5)
Sodium: 139 mEq/L (ref 135–145)

## 2011-12-04 LAB — LDL CHOLESTEROL, DIRECT: Direct LDL: 163 mg/dL

## 2011-12-06 ENCOUNTER — Encounter: Payer: Self-pay | Admitting: Internal Medicine

## 2011-12-06 DIAGNOSIS — Z0001 Encounter for general adult medical examination with abnormal findings: Secondary | ICD-10-CM | POA: Insufficient documentation

## 2011-12-06 DIAGNOSIS — Z5181 Encounter for therapeutic drug level monitoring: Secondary | ICD-10-CM | POA: Insufficient documentation

## 2011-12-11 ENCOUNTER — Other Ambulatory Visit (INDEPENDENT_AMBULATORY_CARE_PROVIDER_SITE_OTHER): Payer: Medicare HMO

## 2011-12-11 ENCOUNTER — Ambulatory Visit (INDEPENDENT_AMBULATORY_CARE_PROVIDER_SITE_OTHER): Payer: Medicare HMO | Admitting: Internal Medicine

## 2011-12-11 VITALS — BP 120/72 | HR 56 | Temp 97.0°F | Ht 71.0 in | Wt 241.0 lb

## 2011-12-11 DIAGNOSIS — Z0000 Encounter for general adult medical examination without abnormal findings: Secondary | ICD-10-CM

## 2011-12-11 DIAGNOSIS — I10 Essential (primary) hypertension: Secondary | ICD-10-CM

## 2011-12-11 DIAGNOSIS — Z125 Encounter for screening for malignant neoplasm of prostate: Secondary | ICD-10-CM

## 2011-12-11 DIAGNOSIS — J309 Allergic rhinitis, unspecified: Secondary | ICD-10-CM

## 2011-12-11 DIAGNOSIS — E7800 Pure hypercholesterolemia, unspecified: Secondary | ICD-10-CM

## 2011-12-11 DIAGNOSIS — M2550 Pain in unspecified joint: Secondary | ICD-10-CM

## 2011-12-11 DIAGNOSIS — I2510 Atherosclerotic heart disease of native coronary artery without angina pectoris: Secondary | ICD-10-CM

## 2011-12-11 LAB — PSA: PSA: 0.92 ng/mL (ref 0.10–4.00)

## 2011-12-11 MED ORDER — METOPROLOL SUCCINATE ER 25 MG PO TB24: 25.0000 mg | ORAL_TABLET | Freq: Every day | ORAL | Status: DC

## 2011-12-11 MED ORDER — NIACIN ER 500 MG PO TBCR: 500.0000 mg | EXTENDED_RELEASE_TABLET | Freq: Every day | ORAL | Status: DC

## 2011-12-11 MED ORDER — VALSARTAN 160 MG PO TABS: 160.0000 mg | ORAL_TABLET | Freq: Every day | ORAL | Status: DC

## 2011-12-11 NOTE — Patient Instructions (Addendum)
Please let us know if you would like to be referred to a certain orthopedic group of your choice Please go to LAB in the Basement for the blood and/or urine tests to be done today - the PSA only You will be contacted by phone if any changes need to be made immediately.  Otherwise, you will receive a letter about your results with an explanation. Your medications were refilled today Please continue your efforts at being more active, low cholesterol diet, and weight control. Please return in 1 year for your yearly visit, or sooner if needed, with Lab testing done 3-5 days before

## 2011-12-11 NOTE — Progress Notes (Signed)
Subjective:    Patient ID: [name] [name], male    DOB: Jan 23, 1946, 66 y.o.   MRN: [id number]  HPI  Here for wellness and f/u;  Overall doing ok;  Pt denies CP, worsening SOB, DOE, wheezing, orthopnea, PND, worsening LE edema, palpitations, dizziness or syncope.  Pt denies neurological change such as new Headache, facial or extremity weakness.  Pt denies polydipsia, polyuria, or low sugar symptoms. Pt states overall good compliance with treatment and medications, good tolerability, and trying to follow lower cholesterol diet.  Pt denies worsening depressive symptoms, suicidal ideation or panic. No fever, wt loss, night sweats, loss of appetite, or other constitutional symptoms.  Pt states good ability with ADL's, low fall risk, home safety reviewed and adequate, no significant changes in hearing or vision, and occasionally active with exercise.  Unfort had abd pain with the crestor, so apparently cannot tolerate any statin, and zetia no help in the past.    States has been doing well with his diet, limited on his exercise due to end stage right knee, needs surgury per [place] [facility], gets cortisone every 6 mo, but figures he will go ahead with surgury at some point, holdin off with his ins only paying 70%.  Did have recent LBP x 4 wks after working with a chainsaw, now resolved.  Does have several wks ongoing nasal allergy symptoms with clear congestion, itch and sneeze, without fever, pain, ST, cough or wheezing.  Needs PSA as for some reason this was not done with his labs.  Also mentions an occasional ? Right facial tremor vs fascicaulation type movement, Past Medical History  Diagnosis Date  . Coronary artery disease   . Hyperlipidemia   . Hypertension   . Erectile dysfunction   . Ischemic cardiomyopathy   . ISCHEMIC CARDIOMYOPATHY 06/04/2007    Qualifier: Diagnosis of  By: Genelle Gather CMA, [place]    . CORONARY ARTERY DISEASE 06/04/2007    Qualifier: Diagnosis of  By: Genelle Gather CMA, [place]    . CORONARY  ARTERY BYPASS GRAFT, HX OF 06/04/2007    Qualifier: Diagnosis of  By: Genelle Gather CMA, [place]    . HYPERTENSION 06/04/2007    Qualifier: Diagnosis of  By: Genelle Gather CMA, [place]    . HYPERLIPIDEMIA 06/04/2007    Qualifier: Diagnosis of  By: Genelle Gather CMA, [place]    . ALLERGIC RHINITIS 10/31/2007    Qualifier: Diagnosis of  By: Jonny Ruiz MD, Len Blalock   . ERECTILE DYSFUNCTION 06/04/2007    Qualifier: History of  By: Genelle Gather CMA, [place]    . COLONIC POLYPS, HX OF 10/31/2007    Qualifier: Diagnosis of  By: Jonny Ruiz MD, Kathi Ludwig, HX OF 06/04/2007    Qualifier: Diagnosis of  By: Genelle Gather CMA, [place]     Past Surgical History  Procedure Date  . Coronary artery bypass graft   . Penile prosthesis implant   . Thoracotomy     left anterior; wound exploration and debridement  . Cholecystectomy   . Spinal cyst     reports that he quit smoking about 30 years ago. His smoking use included Cigarettes. He has never used smokeless tobacco. He reports that he drinks alcohol. He reports that he does not use illicit drugs. family history includes Heart disease in his brother; Lung cancer in his father; and Stomach cancer in his mother. Allergies  Allergen Reactions  . Ace Inhibitors     REACTION: cough  . Rosuvastatin     REACTION: muscle pains  .  Statins     REACTION: muscle pains   Current Outpatient Prescriptions on File Prior to Visit  Medication Sig Dispense Refill  . aspirin 325 MG tablet Take 325 mg by mouth daily.        . fish oil-omega-3 fatty acids 1000 MG capsule Take 2 g by mouth 2 (two) times daily.       . metoprolol succinate (TOPROL-XL) 25 MG 24 hr tablet Take 1 tablet (25 mg total) by mouth daily.  90 tablet  3  . valsartan (DIOVAN) 160 MG tablet Take 1 tablet (160 mg total) by mouth daily.  90 tablet  3   Review of Systems Review of Systems  Constitutional: Negative for diaphoresis, activity change, appetite change and unexpected weight change.  HENT: Negative for hearing  loss, ear pain, facial swelling, mouth sores and neck stiffness.   Eyes: Negative for pain, redness and visual disturbance.  Respiratory: Negative for shortness of breath and wheezing.   Cardiovascular: Negative for chest pain and palpitations.  Gastrointestinal: Negative for diarrhea, blood in stool, abdominal distention and rectal pain.  Genitourinary: Negative for hematuria, flank pain and decreased urine volume.  Musculoskeletal: Negative for myalgias and joint swelling.  Skin: Negative for color change and wound.  Neurological: Negative for syncope and numbness.  Hematological: Negative for adenopathy.  Psychiatric/Behavioral: Negative for hallucinations, self-injury, decreased concentration and agitation.     Objective:   Physical Exam BP 120/72  Pulse 56  Temp 97 F (36.1 C)  Ht 5\' 11"  (1.803 m)  Wt 241 lb (109.317 kg)  BMI 33.61 kg/m2  SpO2 94%  Physical Exam  VS noted Constitutional: Pt is oriented to person, place, and time. Appears well-developed and well-nourished.  HENT:  Head: Normocephalic and atraumatic.  Right Ear: External ear normal.  Left Ear: External ear normal.  Nose: Nose normal.  Mouth/Throat: Oropharynx is clear and moist.  Bilat tm's mild erythema.  Sinus nontender.  Pharynx mild erythema Eyes: Conjunctivae and EOM are normal. Pupils are equal, round, and reactive to light.  Neck: Normal range of motion. Neck supple. No JVD present. No tracheal deviation present.  Cardiovascular: Normal rate, regular rhythm, normal heart sounds and intact distal pulses.   Pulmonary/Chest: Effort normal and breath sounds normal.  Abdominal: Soft. Bowel sounds are normal. There is no tenderness.  Musculoskeletal: Normal range of motion. Exhibits no edema.  Lymphadenopathy:  Has no cervical adenopathy.  Neurological: Pt is alert and oriented to person, place, and time. Pt has normal reflexes. No cranial nerve deficit. No facial movements Skin: Skin is warm and dry. No  rash noted.  Psychiatric:  Has  normal mood and affect. Behavior is normal.     Assessment & Plan:

## 2011-12-12 ENCOUNTER — Encounter: Payer: Self-pay | Admitting: Internal Medicine

## 2011-12-12 NOTE — Assessment & Plan Note (Addendum)
Overall doing well, age appropriate education and counseling updated, referrals for preventative services and immunizations addressed, dietary and smoking counseling addressed, most recent labs and ECG reviewed.  I have personally reviewed and have noted: 1) the patient's medical and social history 2) The pt's use of alcohol, tobacco, and illicit drugs 3) The patient's current medications and supplements 4) Functional ability including ADL's, fall risk, home safety risk, hearing and visual impairment 5) Diet and physical activities 6) Evidence for depression or mood disorder 7) The patient's height, weight, and BMI have been recorded in the chart I have made referrals, and provided counseling and education based on review of the above For PSA as he is due

## 2011-12-12 NOTE — Assessment & Plan Note (Signed)
For allegra or zyrtec otc prn, to f/u any worsening symptoms or concerns

## 2011-12-12 NOTE — Assessment & Plan Note (Signed)
stable overall by hx and exam, most recent data reviewed with pt, and pt to continue medical treatment as before, statin intolerant, for low chol diet Lab Results  Component Value Date   LDLCALC 131* 09/01/2011

## 2011-12-12 NOTE — Assessment & Plan Note (Signed)
Severe right knee pain, for ortho referral but he will call back with name of to whom to refer

## 2012-05-16 ENCOUNTER — Telehealth: Payer: Self-pay | Admitting: Cardiology

## 2012-05-16 NOTE — Telephone Encounter (Signed)
New problem:  crestor 5 mg.

## 2012-05-16 NOTE — Telephone Encounter (Signed)
LMOM- PT crestor was d/c'd.

## 2012-05-17 MED ORDER — ROSUVASTATIN CALCIUM 5 MG PO TABS: 5.0000 mg | ORAL_TABLET | Freq: Every day | ORAL | Status: DC

## 2012-05-17 NOTE — Telephone Encounter (Signed)
Spoke with pt, he is able to tolerate the crestor if he takes it 2 to 3 days weekly. Follow up appt made. Refills sent to pharm

## 2012-05-17 NOTE — Telephone Encounter (Signed)
[facility]. Pt states total cholesterol was 165 and everything else looks good. The [facility] not [facility] crest[facility]. PT states he takes crestor every other day along with niacin. Pt wants to know is it ok for [place] to fill it even thought the medication was discontinued by the lipid clinic. Pt wants medication send to [facility] in [place].

## 2012-06-27 ENCOUNTER — Telehealth: Payer: Self-pay | Admitting: Cardiology

## 2012-06-27 ENCOUNTER — Encounter: Payer: Self-pay | Admitting: Cardiology

## 2012-06-27 ENCOUNTER — Ambulatory Visit (INDEPENDENT_AMBULATORY_CARE_PROVIDER_SITE_OTHER): Payer: Medicare HMO | Admitting: Cardiology

## 2012-06-27 VITALS — BP 114/80 | HR 64 | Ht 71.0 in | Wt 232.0 lb

## 2012-06-27 DIAGNOSIS — I2510 Atherosclerotic heart disease of native coronary artery without angina pectoris: Secondary | ICD-10-CM

## 2012-06-27 DIAGNOSIS — R0602 Shortness of breath: Secondary | ICD-10-CM

## 2012-06-27 DIAGNOSIS — Z951 Presence of aortocoronary bypass graft: Secondary | ICD-10-CM

## 2012-06-27 DIAGNOSIS — E785 Hyperlipidemia, unspecified: Secondary | ICD-10-CM

## 2012-06-27 DIAGNOSIS — E7800 Pure hypercholesterolemia, unspecified: Secondary | ICD-10-CM

## 2012-06-27 DIAGNOSIS — I2589 Other forms of chronic ischemic heart disease: Secondary | ICD-10-CM

## 2012-06-27 DIAGNOSIS — I10 Essential (primary) hypertension: Secondary | ICD-10-CM

## 2012-06-27 MED ORDER — ROSUVASTATIN CALCIUM 5 MG PO TABS: 5.0000 mg | ORAL_TABLET | Freq: Every day | ORAL | Status: DC

## 2012-06-27 MED ORDER — METOPROLOL SUCCINATE ER 25 MG PO TB24: 25.0000 mg | ORAL_TABLET | Freq: Every day | ORAL | Status: DC

## 2012-06-27 MED ORDER — VALSARTAN 160 MG PO TABS: 160.0000 mg | ORAL_TABLET | Freq: Every day | ORAL | Status: DC

## 2012-06-27 NOTE — Assessment & Plan Note (Signed)
Continue present blood pressure medications. Potassium and renal function monitored at the [place].

## 2012-06-27 NOTE — Assessment & Plan Note (Signed)
Continue aspirin and statin. Schedule Myoview for risk stratification. 

## 2012-06-27 NOTE — Assessment & Plan Note (Signed)
Continue present medications. Lipids and liver monitored at the [place].

## 2012-06-27 NOTE — Progress Notes (Signed)
HPI: [name] is a very pleasant gentleman who has a history of coronary artery disease dating back to 1998 when he had a myocardial infarction. He subsequently underwent minimally invasive LIMA to his LAD. Most recent Myoview was performed in Jan 2011. At time, his ejection fraction was 43%. There is a large anteroapical infarct with no ischemia. We have been treating this medically. An echocardiogram on February 25, 2007, showed an ejection fraction of 40-45%. There was mild mitral regurgitation as well as tricuspid regurgitation. I last saw him in Oct 2012. Since then the patient has dyspnea with more extreme activities but not with routine activities. It is relieved with rest. It is not associated with chest pain. There is no orthopnea, PND or pedal edema. There is no syncope or palpitations. There is no exertional chest pain.   Current Outpatient Prescriptions  Medication Sig Dispense Refill  . aspirin 325 MG tablet Take 325 mg by mouth daily.        . fish oil-omega-3 fatty acids 1000 MG capsule Take 2 g by mouth 2 (two) times daily.       . Glucosamine-Chondroitin (GLUCOSAMINE CHONDR COMPLEX PO) Take by mouth daily.      . Levothyroxine Sodium (LEVOTHROID PO) Take by mouth daily.      . metoprolol succinate (TOPROL-XL) 25 MG 24 hr tablet Take 1 tablet (25 mg total) by mouth daily.  90 tablet  3  . Multiple Vitamins-Minerals (CENTRUM SILVER PO) Take by mouth daily.      . niacin (SLO-NIACIN) 500 MG tablet Take 500 mg by mouth at bedtime. 3 Tablets Once A Day      . NON FORMULARY Mega Red Daily      . rosuvastatin (CRESTOR) 5 MG tablet Take 1 tablet (5 mg total) by mouth daily.  30 tablet  12  . valsartan (DIOVAN) 160 MG tablet Take 1 tablet (160 mg total) by mouth daily.  90 tablet  3     Past Medical History  Diagnosis Date  . Erectile dysfunction   . ISCHEMIC CARDIOMYOPATHY 06/04/2007    Qualifier: Diagnosis of  By: [name] [name] CMA, [name] [name]    . CORONARY ARTERY DISEASE 06/04/2007   Qualifier: Diagnosis of  By: [name] [name] CMA, [name] [name]    . CORONARY ARTERY BYPASS GRAFT, HX OF 06/04/2007    Qualifier: Diagnosis of  By: [name] [name] CMA, [name] [name]    . HYPERTENSION 06/04/2007    Qualifier: Diagnosis of  By: [name] [name] CMA, [name] [name]    . HYPERLIPIDEMIA 06/04/2007    Qualifier: Diagnosis of  By: [name] [name] CMA, [name] [name]    . ALLERGIC RHINITIS 10/31/2007    Qualifier: Diagnosis of  By: [name] Ruiz MD, [name] [name]   . ERECTILE DYSFUNCTION 06/04/2007    Qualifier: History of  By: [name] [name] CMA, [name] [name]    . COLONIC POLYPS, HX OF 10/31/2007    Qualifier: Diagnosis of  By: [name] Ruiz MD, [name] [name], HX OF 06/04/2007    Qualifier: Diagnosis of  By: [name] [name] CMA, [name] [name]      Past Surgical History  Procedure Date  . Coronary artery bypass graft   . Penile prosthesis implant   . Thoracotomy     left anterior; wound exploration and debridement  . Cholecystectomy   . Spinal cyst     History   Social History  . Marital Status: Married    Spouse Name: N/A    Number of Children: 3  . Years of Education: N/A   Occupational History  .  truck driver    Social History Main Topics  . Smoking status: Former Smoker    Types: Cigarettes    Quit date: 05/07/1981  . Smokeless tobacco: Never Used  . Alcohol Use: Yes     Comment: rare  . Drug Use: No  . Sexually Active: Not on file   Other Topics Concern  . Not on file   Social History Narrative  . No narrative on file    ROS: no fevers or chills, productive cough, hemoptysis, dysphasia, odynophagia, melena, hematochezia, dysuria, hematuria, rash, seizure activity, orthopnea, PND, pedal edema, claudication. Remaining systems are negative.  Physical Exam: Well-developed well-nourished in no acute distress.  Skin is warm and dry.  HEENT is normal.  Neck is supple.  Chest is clear to auscultation with normal expansion.  Cardiovascular exam is regular rate and rhythm.  Abdominal exam nontender or distended. No masses palpated. Extremities show  no edema. neuro grossly intact  ECG sinus rhythm at a rate of 58. Previous anterior infarct. Left axis deviation.

## 2012-06-27 NOTE — Telephone Encounter (Signed)
New problem;   Discuss stress test options. Due to billing prices at [employer] [facility].

## 2012-06-27 NOTE — Patient Instructions (Addendum)
Your physician has requested that you have a lexiscan myoview. For further information please visit [email or web address] Please follow instruction sheet, as given.  Call your insurance provider and tell them that your cardiologist is wanting you to have a Lexiscan Myoview.  The billing code is [id number] and the test will be preformed at [facility].  Please let us know when your are ready and we will schedule the test.

## 2012-06-27 NOTE — Assessment & Plan Note (Signed)
Continue ARB and beta blocker. 

## 2012-06-27 NOTE — Telephone Encounter (Signed)
Needs to be set up for a Lexiscan at [facility] per pt's request.

## 2012-06-27 NOTE — Assessment & Plan Note (Deleted)
Continue aspirin and statin. Schedule Myoview for risk stratification. 

## 2012-06-28 NOTE — Telephone Encounter (Signed)
Order placed and sent to [facility] to schedule with the pt

## 2012-06-30 ENCOUNTER — Telehealth: Payer: Self-pay | Admitting: Cardiology

## 2012-06-30 NOTE — Telephone Encounter (Signed)
Unable to reach pt or leave a message  

## 2012-06-30 NOTE — Telephone Encounter (Signed)
Pt rtn call to debra °

## 2012-07-04 ENCOUNTER — Encounter: Payer: Self-pay | Admitting: Cardiology

## 2012-07-12 ENCOUNTER — Telehealth: Payer: Self-pay | Admitting: Cardiology

## 2012-07-12 NOTE — Telephone Encounter (Signed)
Patient returning nurse call, he can be reached at [facility] or cell#

## 2012-07-12 NOTE — Telephone Encounter (Signed)
Pt returning Sharon's call in scheduling.

## 2012-07-18 ENCOUNTER — Ambulatory Visit (HOSPITAL_COMMUNITY): Payer: Medicare HMO | Attending: Internal Medicine | Admitting: Radiology

## 2012-07-18 VITALS — BP 117/73 | HR 65 | Ht 71.0 in | Wt 229.0 lb

## 2012-07-18 DIAGNOSIS — E785 Hyperlipidemia, unspecified: Secondary | ICD-10-CM | POA: Insufficient documentation

## 2012-07-18 DIAGNOSIS — R55 Syncope and collapse: Secondary | ICD-10-CM | POA: Insufficient documentation

## 2012-07-18 DIAGNOSIS — R0989 Other specified symptoms and signs involving the circulatory and respiratory systems: Secondary | ICD-10-CM | POA: Insufficient documentation

## 2012-07-18 DIAGNOSIS — R0602 Shortness of breath: Secondary | ICD-10-CM

## 2012-07-18 DIAGNOSIS — R42 Dizziness and giddiness: Secondary | ICD-10-CM | POA: Insufficient documentation

## 2012-07-18 DIAGNOSIS — R0609 Other forms of dyspnea: Secondary | ICD-10-CM | POA: Insufficient documentation

## 2012-07-18 DIAGNOSIS — I2510 Atherosclerotic heart disease of native coronary artery without angina pectoris: Secondary | ICD-10-CM | POA: Insufficient documentation

## 2012-07-18 DIAGNOSIS — I252 Old myocardial infarction: Secondary | ICD-10-CM | POA: Insufficient documentation

## 2012-07-18 DIAGNOSIS — Z8249 Family history of ischemic heart disease and other diseases of the circulatory system: Secondary | ICD-10-CM | POA: Insufficient documentation

## 2012-07-18 DIAGNOSIS — Z951 Presence of aortocoronary bypass graft: Secondary | ICD-10-CM | POA: Insufficient documentation

## 2012-07-18 DIAGNOSIS — Z87891 Personal history of nicotine dependence: Secondary | ICD-10-CM | POA: Insufficient documentation

## 2012-07-18 DIAGNOSIS — E669 Obesity, unspecified: Secondary | ICD-10-CM | POA: Insufficient documentation

## 2012-07-18 MED ORDER — TECHNETIUM TC 99M SESTAMIBI GENERIC - CARDIOLITE
11.0000 | Freq: Once | INTRAVENOUS | Status: AC | PRN
  Administered 2012-07-18: 11 via INTRAVENOUS

## 2012-07-18 MED ORDER — REGADENOSON 0.4 MG/5ML IV SOLN
0.4000 mg | Freq: Once | INTRAVENOUS | Status: AC
  Administered 2012-07-18: 0.4 mg via INTRAVENOUS

## 2012-07-18 MED ORDER — TECHNETIUM TC 99M SESTAMIBI GENERIC - CARDIOLITE
33.0000 | Freq: Once | INTRAVENOUS | Status: AC | PRN
  Administered 2012-07-18: 33 via INTRAVENOUS

## 2012-07-18 MED ORDER — AMINOPHYLLINE 25 MG/ML IV SOLN
75.0000 mg | Freq: Once | INTRAVENOUS | Status: AC
Start: 2012-07-18 — End: 2012-07-18
  Administered 2012-07-18: 75 mg via INTRAVENOUS

## 2012-07-18 NOTE — Progress Notes (Signed)
[facility] SITE 3 NUCLEAR MED [address] [id number] [place] [address] [phone]  Cardiology Nuclear Med Study  [name] [name] is a 66 y.o. male     MRN : [id number]     DOB: [birth year]  Procedure Date: 07/18/2012  Nuclear Med Background Indication for Stress Test:  Evaluation for Ischemia and Graft Patency History:  '98 MI>CABG; '08 Echo:EF=45%; '11 JJO:ACZYS antero-septal scar, no ischemia, EF=43% Cardiac Risk Factors: Family History - CAD, History of Smoking, Hypertension, Lipids and Obesity  Symptoms:  Dizziness, DOE and Near Syncope   Nuclear Pre-Procedure Caffeine/Decaff Intake:  None > 12 hrs NPO After: 5:00pm   Lungs:  Clear. O2 Sat: 95% on room air. IV 0.9% NS with Angio Cath:  20g  IV Site: R Antecubital x 1, tolerated well IV Started by:  Irean Hong, RN  Chest Size (in):  46 Cup Size: n/a  Height: 5\' 11"  (1.803 m)  Weight:  229 lb (103.874 kg)  BMI:  Body mass index is 31.94 kg/(m^2). Tech Comments:  Held Toprol x 24 hours. Nausea with dry heaves this am that is resolving upon arrival per patient. Irean Hong, RN.    Nuclear Med Study 1 or 2 day study: 1 day  Stress Test Type:  Treadmill/Lexiscan  Reading MD: Dietrich Pates, MD  Order Authorizing Provider:  Olga Millers, MD  Resting Radionuclide: Technetium 76m Sestamibi  Resting Radionuclide Dose: 11.0 mCi   Stress Radionuclide:  Technetium 24m Sestamibi  Stress Radionuclide Dose: 33.0 mCi           Stress Protocol Rest HR: 65 Stress HR: 100  Rest BP: 117/73 Stress BP: 220/120 with projectile vomiting  Exercise Time (min): 2:00 METS: n/a   Predicted Max HR: 153 bpm % Max HR: 65.36 bpm Rate Pressure Product: 06301   Dose of Adenosine (mg):  n/a Dose of Lexiscan: 0.4 mg  Dose of Atropine (mg): n/a Dose of Dobutamine: n/a mcg/kg/min (at max HR)  Stress Test Technologist: Smiley Houseman, CMA-N  Nuclear Technologist:  Domenic Polite, CNMT     Rest Procedure:   Myocardial perfusion imaging was performed at rest 45 minutes following the intravenous administration of Technetium 39m Sestamibi.  Rest ECG: NSR 65.  Anterior MI  Stress Procedure:  The patient received IV Lexiscan 0.4 mg over 15-seconds with concurrent low level exercise and then Technetium 6m Sestamibi was injected at 30-seconds while the patient continued walking less than a minute. He was then stopped due to nausea and projectile vomiting and was given Aminophylline 75 mg IV with relieve.  Quantitative spect images were obtained after a 45-minute delay.  Stress ECG: No significant change from baseline ECG  QPS Raw Data Images:  Rest images were motion corrected.  Soft tissue (diaphragm) underlies heart. Stress Images:  Large perfusion defect in the anterior (base, mid, distal) antoerolateral (distal), anteroseptal (distal), Inferolateral (distal) inferoseptal (base, mid) and apex.   Rest Images:  Comparison with the stress images reveals no significant change. Subtraction (SDS):  No significant ischemia by quantitation. Transient Ischemic Dilatation (Normal <1.22):  1.00 Lung/Heart Ratio (Normal <0.45):  0.32  Quantitative Gated Spect Images QGS EDV:  NA QGS ESV:  NA  Impression Exercise Capacity:  Lexiscan with low level exercise. BP Response:  Hypertensive blood pressure response. Clinical Symptoms:  No chest pain. ECG Impression:  No significant ST segment change suggestive of ischemia. Comparison with Prior Nuclear Study: No significant change from previous report.  Overall Impression:  Large region of  scar in the anterior, distal anteolateral/inferolateral/inferoseptal, the inferoseptal and apical walls.  Cannot exclude some soft tissue attenuation inferiorly as images not gated.  No evidence for significant ischemia.    LV Ejection Fraction: Study not gated.  LV Wall Motion:  NA   Dietrich Pates

## 2012-12-08 ENCOUNTER — Telehealth: Payer: Self-pay | Admitting: *Deleted

## 2012-12-08 NOTE — Telephone Encounter (Signed)
Message copied by Deatra James on Thu Dec 08, 2012  4:42 PM ------      Message from: Etheleen Sia      Created: Thu Dec 08, 2012  8:50 AM      Regarding: LABS       PHYSICAL LABS FOR MAY 21 APPT ------

## 2012-12-12 ENCOUNTER — Other Ambulatory Visit: Payer: Self-pay | Admitting: Internal Medicine

## 2012-12-21 ENCOUNTER — Other Ambulatory Visit: Payer: Self-pay | Admitting: Internal Medicine

## 2012-12-23 ENCOUNTER — Other Ambulatory Visit (INDEPENDENT_AMBULATORY_CARE_PROVIDER_SITE_OTHER): Payer: Medicare HMO

## 2012-12-23 DIAGNOSIS — Z0000 Encounter for general adult medical examination without abnormal findings: Secondary | ICD-10-CM

## 2012-12-23 DIAGNOSIS — Z125 Encounter for screening for malignant neoplasm of prostate: Secondary | ICD-10-CM

## 2012-12-23 DIAGNOSIS — E7800 Pure hypercholesterolemia, unspecified: Secondary | ICD-10-CM

## 2012-12-23 DIAGNOSIS — I10 Essential (primary) hypertension: Secondary | ICD-10-CM

## 2012-12-23 LAB — PSA: PSA: 0.78 ng/mL (ref 0.10–4.00)

## 2012-12-23 LAB — HEPATIC FUNCTION PANEL
AST: 21 U/L (ref 0–37)
Alkaline Phosphatase: 51 U/L (ref 39–117)
Bilirubin, Direct: 0.2 mg/dL (ref 0.0–0.3)
Total Protein: 6.3 g/dL (ref 6.0–8.3)

## 2012-12-23 LAB — CBC WITH DIFFERENTIAL/PLATELET
Basophils Relative: 0.9 % (ref 0.0–3.0)
Eosinophils Absolute: 0.2 10*3/uL (ref 0.0–0.7)
Eosinophils Relative: 4 % (ref 0.0–5.0)
Hemoglobin: 14.9 g/dL (ref 13.0–17.0)
Lymphocytes Relative: 28.6 % (ref 12.0–46.0)
MCHC: 34.5 g/dL (ref 30.0–36.0)
Monocytes Relative: 7.7 % (ref 3.0–12.0)
Neutro Abs: 3 10*3/uL (ref 1.4–7.7)
Neutrophils Relative %: 58.8 % (ref 43.0–77.0)
RBC: 4.59 Mil/uL (ref 4.22–5.81)
WBC: 5.1 10*3/uL (ref 4.5–10.5)

## 2012-12-23 LAB — URINALYSIS, ROUTINE W REFLEX MICROSCOPIC
Bilirubin Urine: NEGATIVE
Ketones, ur: NEGATIVE
Leukocytes, UA: NEGATIVE
Nitrite: NEGATIVE
Urobilinogen, UA: 1 (ref 0.0–1.0)
pH: 5.5 (ref 5.0–8.0)

## 2012-12-23 LAB — BASIC METABOLIC PANEL
BUN: 13 mg/dL (ref 6–23)
Calcium: 9 mg/dL (ref 8.4–10.5)
GFR: 80.91 mL/min (ref >60.00)
Glucose, Bld: 101 mg/dL — ABNORMAL HIGH (ref 70–99)

## 2012-12-23 LAB — LIPID PANEL: Cholesterol: 238 mg/dL — ABNORMAL HIGH (ref 0–200)

## 2012-12-23 LAB — TSH: TSH: 2.61 u[IU]/mL (ref 0.35–5.50)

## 2012-12-28 ENCOUNTER — Ambulatory Visit (INDEPENDENT_AMBULATORY_CARE_PROVIDER_SITE_OTHER): Payer: Medicare HMO | Admitting: Internal Medicine

## 2012-12-28 ENCOUNTER — Encounter: Payer: Self-pay | Admitting: Internal Medicine

## 2012-12-28 VITALS — BP 120/80 | HR 63 | Temp 97.0°F | Ht 71.0 in | Wt 235.1 lb

## 2012-12-28 DIAGNOSIS — G4710 Hypersomnia, unspecified: Secondary | ICD-10-CM

## 2012-12-28 DIAGNOSIS — E7800 Pure hypercholesterolemia, unspecified: Secondary | ICD-10-CM

## 2012-12-28 DIAGNOSIS — I2510 Atherosclerotic heart disease of native coronary artery without angina pectoris: Secondary | ICD-10-CM

## 2012-12-28 DIAGNOSIS — I10 Essential (primary) hypertension: Secondary | ICD-10-CM

## 2012-12-28 DIAGNOSIS — Z0000 Encounter for general adult medical examination without abnormal findings: Secondary | ICD-10-CM

## 2012-12-28 MED ORDER — NIACIN ER 500 MG PO TBCR: 500.0000 mg | EXTENDED_RELEASE_TABLET | Freq: Every day | ORAL | Status: DC

## 2012-12-28 MED ORDER — METOPROLOL SUCCINATE ER 25 MG PO TB24: 25.0000 mg | ORAL_TABLET | Freq: Every day | ORAL | Status: DC

## 2012-12-28 MED ORDER — ROSUVASTATIN CALCIUM 5 MG PO TABS: 5.0000 mg | ORAL_TABLET | Freq: Every day | ORAL | Status: DC

## 2012-12-28 MED ORDER — VALSARTAN 160 MG PO TABS: ORAL_TABLET | ORAL | Status: DC

## 2012-12-28 NOTE — Assessment & Plan Note (Signed)

## 2012-12-28 NOTE — Patient Instructions (Signed)
Please continue all other medications as before, and refills have been done if requested. Please have the pharmacy call with any other refills you may need. Please keep your appointments with your specialists as you have planned - [facility] You will be contacted regarding the referral for: pulmonary Please continue your efforts at being more active, low cholesterol diet, and weight control. You are otherwise up to date with prevention measures today. Please remember to sign up for My Chart if you have not done so, as this will be important to you in the future with finding out test results, communicating by private email, and scheduling acute appointments online when needed. Please return in 1 year for your yearly visit, or sooner if needed, with Lab testing done 3-5 days before

## 2012-12-28 NOTE — Progress Notes (Signed)
Subjective:    Patient ID: [name] [name], male    DOB: 11-07-1945, 67 y.o.   MRN: [id number]  HPI   Here for wellness and f/u;  Overall doing ok;  Pt denies CP, worsening SOB, DOE, wheezing, orthopnea, PND, worsening LE edema, palpitations, dizziness or syncope.  Pt denies neurological change such as new headache, facial or extremity weakness.  Pt denies polydipsia, polyuria, or low sugar symptoms. Pt states overall good compliance with treatment and medications, good tolerability, and has been trying to follow lower cholesterol diet.  Pt denies worsening depressive symptoms, suicidal ideation or panic. No fever, night sweats, wt loss, loss of appetite, or other constitutional symptoms.  Pt states good ability with ADL's, has low fall risk, home safety reviewed and adequate, no other significant changes in hearing or vision, and only occasionally active with exercise.  Has significant daytime somolence. Only takes the crestor 5 mg a few times per wk due to leg pain with walking.  Past Medical History  Diagnosis Date  . Erectile dysfunction   . ISCHEMIC CARDIOMYOPATHY 06/04/2007    Qualifier: Diagnosis of  By: Genelle Gather CMA, [place]    . CORONARY ARTERY DISEASE 06/04/2007    Qualifier: Diagnosis of  By: Genelle Gather CMA, [place]    . CORONARY ARTERY BYPASS GRAFT, HX OF 06/04/2007    Qualifier: Diagnosis of  By: Genelle Gather CMA, [place]    . HYPERTENSION 06/04/2007    Qualifier: Diagnosis of  By: Genelle Gather CMA, [place]    . HYPERLIPIDEMIA 06/04/2007    Qualifier: Diagnosis of  By: Genelle Gather CMA, [place]    . ALLERGIC RHINITIS 10/31/2007    Qualifier: Diagnosis of  By: Jonny Ruiz MD, Len Blalock   . ERECTILE DYSFUNCTION 06/04/2007    Qualifier: History of  By: Genelle Gather CMA, [place]    . COLONIC POLYPS, HX OF 10/31/2007    Qualifier: Diagnosis of  By: Jonny Ruiz MD, Kathi Ludwig, HX OF 06/04/2007    Qualifier: Diagnosis of  By: Genelle Gather CMA, [place]     Past Surgical History  Procedure Laterality Date  . Coronary  artery bypass graft    . Penile prosthesis implant    . Thoracotomy      left anterior; wound exploration and debridement  . Cholecystectomy    . Spinal cyst      reports that he quit smoking about 31 years ago. His smoking use included Cigarettes. He smoked 0.00 packs per day. He has never used smokeless tobacco. He reports that  drinks alcohol. He reports that he does not use illicit drugs. family history includes Heart disease in his brother; Lung cancer in his father; and Stomach cancer in his mother. Allergies  Allergen Reactions  . Ace Inhibitors     REACTION: cough  . Rosuvastatin     REACTION: muscle pains  . Statins     REACTION: muscle pains   Current Outpatient Prescriptions on File Prior to Visit  Medication Sig Dispense Refill  . aspirin 325 MG tablet Take 325 mg by mouth daily.        Marland Kitchen DIOVAN 160 MG tablet take 1 tablet by mouth once daily  90 tablet  0  . fish oil-omega-3 fatty acids 1000 MG capsule Take 2 g by mouth 2 (two) times daily.       . Glucosamine-Chondroitin (GLUCOSAMINE CHONDR COMPLEX PO) Take by mouth daily.      . Levothyroxine Sodium (LEVOTHROID PO) Take by mouth daily.      Marland Kitchen  metoprolol succinate (TOPROL-XL) 25 MG 24 hr tablet Take 1 tablet (25 mg total) by mouth daily.  90 tablet  3  . Multiple Vitamins-Minerals (CENTRUM SILVER PO) Take by mouth daily.      . niacin (SLO-NIACIN) 500 MG tablet Take 500 mg by mouth at bedtime. 3 Tablets Once A Day      . NON FORMULARY Mega Red Daily      . rosuvastatin (CRESTOR) 5 MG tablet Take 1 tablet (5 mg total) by mouth daily.  30 tablet  12   No current facility-administered medications on file prior to visit.   Review of Systems Constitutional: Negative for diaphoresis, activity change, appetite change or unexpected weight change.  HENT: Negative for hearing loss, ear pain, facial swelling, mouth sores and neck stiffness.   Eyes: Negative for pain, redness and visual disturbance.  Respiratory: Negative for  shortness of breath and wheezing.   Cardiovascular: Negative for chest pain and palpitations.  Gastrointestinal: Negative for diarrhea, blood in stool, abdominal distention or other pain Genitourinary: Negative for hematuria, flank pain or change in urine volume.  Musculoskeletal: Negative for myalgias and joint swelling. except for right knee severe DJD, will  Likely need right kne TKR soon per [place] [facility] Skin: Negative for color change and wound.  Neurological: Negative for syncope and numbness. other than noted Hematological: Negative for adenopathy.  Psychiatric/Behavioral: Negative for hallucinations, self-injury, decreased concentration and agitation.      Objective:   Physical Exam BP 120/80  Pulse 63  Temp(Src) 97 F (36.1 C) (Oral)  Ht 5\' 11"  (1.803 m)  Wt 235 lb 2 oz (106.652 kg)  BMI 32.81 kg/m2  SpO2 97% VS noted,  Constitutional: Pt is oriented to person, place, and time. Appears well-developed and well-nourished.  Head: Normocephalic and atraumatic.  Right Ear: External ear normal.  Left Ear: External ear normal.  Nose: Nose normal.  Mouth/Throat: Oropharynx is clear and moist.  Eyes: Conjunctivae and EOM are normal. Pupils are equal, round, and reactive to light.  Neck: Normal range of motion. Neck supple. No JVD present. No tracheal deviation present.  Cardiovascular: Normal rate, regular rhythm, normal heart sounds and intact distal pulses.   Pulmonary/Chest: Effort normal and breath sounds normal.  Abdominal: Soft. Bowel sounds are normal. There is no tenderness. No HSM  Musculoskeletal: Normal range of motion. Exhibits no edema.  Lymphadenopathy:  Has no cervical adenopathy.  Neurological: Pt is alert and oriented to person, place, and time. Pt has normal reflexes. No cranial nerve deficit.  Skin: Skin is warm and dry. No rash noted.  Psychiatric:  Has  normal mood and affect. Behavior is normal.     Assessment & Plan:

## 2013-01-19 ENCOUNTER — Institutional Professional Consult (permissible substitution): Payer: Medicare HMO | Admitting: Pulmonary Disease

## 2013-02-02 ENCOUNTER — Encounter: Payer: Self-pay | Admitting: Pulmonary Disease

## 2013-02-02 ENCOUNTER — Ambulatory Visit (INDEPENDENT_AMBULATORY_CARE_PROVIDER_SITE_OTHER): Payer: Medicare HMO | Admitting: Pulmonary Disease

## 2013-02-02 VITALS — BP 112/82 | HR 63 | Temp 97.2°F | Ht 69.5 in | Wt 229.6 lb

## 2013-02-02 DIAGNOSIS — G4761 Periodic limb movement disorder: Secondary | ICD-10-CM | POA: Insufficient documentation

## 2013-02-02 DIAGNOSIS — G4733 Obstructive sleep apnea (adult) (pediatric): Secondary | ICD-10-CM

## 2013-02-02 NOTE — Patient Instructions (Addendum)
Will schedule for a sleep study, and arrange followup once the results are available.  

## 2013-02-02 NOTE — Assessment & Plan Note (Signed)
The patient's history is very suggestive of clinically significant sleep apnea.  He has nonrestorative sleep, and significant daytime sleepiness.  He has been noted to have snoring and an abnormal breathing pattern during sleep.  I have had a long discussion with him about the pathophysiology of sleep apnea, including its impact to his cardiovascular health and quality of life.  The patient already has significant underlying heart disease, and it would be crucial to treat him aggressively.

## 2013-02-02 NOTE — Progress Notes (Signed)
  Subjective:    Patient ID: [name] [name], male    DOB: 10-25-1945, 67 y.o.   MRN: [id number]  HPI The patient is a 67 year old male who I've been asked to see for possible obstructive sleep apnea.  He has been noted to have loud snoring, as well as an abnormal breathing pattern during sleep.  The patient has not rested in the mornings upon arising, and has significant daytime sleepiness with any period of inactivity.  He will also follow sleep easily in the evenings watching television.  He has mild sleep pressure driving longer distances.  The patient states that his weight is neutral over the last few years, and his Epworth score is very abnormal today at 14.  Sleep Questionnaire What time do you typically go to bed?( Between what hours) 10p 10p at 0918 on 02/02/13 by Nita Sells, CMA How long does it take you to fall asleep? 10-100mins 10-71mins at 0918 on 02/02/13 by Nita Sells, CMA How many times during the night do you wake up? 1 1 at 0918 on 02/02/13 by Nita Sells, CMA What time do you get out of bed to start your day? 0800 0800 at 0918 on 02/02/13 by Nita Sells, CMA Do you drive or operate heavy machinery in your occupation? No No at 0918 on 02/02/13 by Nita Sells, CMA How much has your weight changed (up or down) over the past two years? (In pounds) 0 oz (0 kg) 0 oz (0 kg) at 0918 on 02/02/13 by Nita Sells, CMA Have you ever had a sleep study before? No No at 0918 on 02/02/13 by Nita Sells, CMA Do you currently use CPAP? No No at 0918 on 02/02/13 by Marjo Bicker Mabe, CMA Do you wear oxygen at any time? No No at 0918 on 02/02/13 by Nita Sells, CMA    Review of Systems  Constitutional: Negative for fever and unexpected weight change.  HENT: Negative for ear pain, nosebleeds, congestion, sore throat, rhinorrhea, sneezing, trouble swallowing, dental problem, postnasal drip and sinus pressure.   Eyes: Negative for redness and itching.   Respiratory: Negative for cough, chest tightness, shortness of breath and wheezing.   Cardiovascular: Negative for palpitations and leg swelling.  Gastrointestinal: Negative for nausea and vomiting.  Genitourinary: Negative for dysuria.  Musculoskeletal: Negative for joint swelling.  Skin: Negative for rash.  Neurological: Negative for headaches.  Hematological: Does not bruise/bleed easily.  Psychiatric/Behavioral: Negative for dysphoric mood. The patient is not nervous/anxious.        Objective:   Physical Exam Constitutional:  Overweight male, no acute distress  HENT:  Nares patent without discharge  Oropharynx without exudate, palate and uvula are mildly elongated.  Eyes:  Perrla, eomi, no scleral icterus  Neck:  No JVD, no TMG  Cardiovascular:  Normal rate, regular rhythm, no rubs or gallops.  No murmurs        Intact distal pulses  Pulmonary :  Normal breath sounds, no stridor or respiratory distress   No rales, rhonchi, or wheezing  Abdominal:  Soft, nondistended, bowel sounds present.  No tenderness noted.   Musculoskeletal:  No lower extremity edema noted.  Lymph Nodes:  No cervical lymphadenopathy noted  Skin:  No cyanosis noted  Neurologic:  Alert, appropriate, moves all 4 extremities without obvious deficit.         Assessment & Plan:

## 2013-02-13 ENCOUNTER — Telehealth: Payer: Self-pay | Admitting: Pulmonary Disease

## 2013-02-13 NOTE — Telephone Encounter (Signed)
Sleep Study scheduled for 02/28/13 Authorization has been obtained by [facility]. Authorization # [id number]. Advised patient of authorization #. Pt has additional questions about the sleep lab and pulmonary being in network with [employer]. We are checking on this for patient and he is aware that we will be back in touch, once we find out the status.  Contact patient on mobile number if unable to reach on home number. Rhonda J Cobb

## 2013-02-17 NOTE — Telephone Encounter (Signed)
Please advise if this msg can be closed, thank you 

## 2013-02-17 NOTE — Telephone Encounter (Signed)
No, I need to check with Almyra Free since she did the pre cert on this. Need to find out if the facility is in or out of [employer]'s network. Still working on this message. Rhonda J Cobb

## 2013-02-21 ENCOUNTER — Other Ambulatory Visit: Payer: Self-pay | Admitting: Internal Medicine

## 2013-02-21 NOTE — Telephone Encounter (Signed)
Spoke to this pt and pt has been made aware that all of [place] and [place] is in network with [employer] verified by [email or web address] [email or web address]  Tobe Sos

## 2013-02-28 ENCOUNTER — Ambulatory Visit (HOSPITAL_BASED_OUTPATIENT_CLINIC_OR_DEPARTMENT_OTHER): Payer: Medicare HMO | Attending: Pulmonary Disease | Admitting: Radiology

## 2013-02-28 VITALS — Ht 70.0 in | Wt 229.0 lb

## 2013-02-28 DIAGNOSIS — G4733 Obstructive sleep apnea (adult) (pediatric): Secondary | ICD-10-CM

## 2013-02-28 DIAGNOSIS — G4710 Hypersomnia, unspecified: Secondary | ICD-10-CM | POA: Insufficient documentation

## 2013-02-28 DIAGNOSIS — R259 Unspecified abnormal involuntary movements: Secondary | ICD-10-CM | POA: Insufficient documentation

## 2013-02-28 DIAGNOSIS — G4730 Sleep apnea, unspecified: Secondary | ICD-10-CM | POA: Insufficient documentation

## 2013-03-13 ENCOUNTER — Telehealth: Payer: Self-pay | Admitting: Pulmonary Disease

## 2013-03-13 NOTE — Telephone Encounter (Signed)
Pt had sleep study 02/28/13. Pt is aware KC has been out of the office and not due back until next week. He is fine with that. Will forward to [facility] as an fyi.

## 2013-03-20 DIAGNOSIS — G4710 Hypersomnia, unspecified: Secondary | ICD-10-CM

## 2013-03-20 DIAGNOSIS — G4761 Periodic limb movement disorder: Secondary | ICD-10-CM

## 2013-03-20 DIAGNOSIS — G4730 Sleep apnea, unspecified: Secondary | ICD-10-CM

## 2013-03-20 NOTE — Procedures (Signed)
NAME:  [name] [name], BREIT NO.:  [id number]  MEDICAL RECORD NO.:  [id number]          PATIENT TYPE:  OUT  LOCATION:  SLEEP CENTER                 FACILITY:  [facility]  PHYSICIAN:  Barbaraann Share, MD,FCCPDATE OF BIRTH:  1946/02/08  DATE OF STUDY:  02/28/2013                           NOCTURNAL POLYSOMNOGRAM  REFERRING PHYSICIAN:  Barbaraann Share, MD,FCCP  INDICATION FOR STUDY:  Hypersomnia with sleep apnea.  EPWORTH SLEEPINESS SCORE:  15.  SLEEP ARCHITECTURE:  The patient had total sleep time of 334 minutes with no slow-wave sleep and decreased quantity of REM.  Sleep onset latency was mildly prolonged at 36 minutes, and REM onset was normal at 86 minutes.  Sleep efficiency was moderately reduced at 74%.  RESPIRATORY DATA:  The patient was found to have 1 obstructive apnea and 10 obstructive hypopneas, giving him an apnea-hypopnea index of 2 events per hour.  The events occurred in all body positions, and there was mild to moderate snoring noted throughout.  OXYGEN DATA:  There was O2 desaturation as low as 89% with the patient's obstructive events.  CARDIAC DATA:  Occasional PVCs noted, but no clinically significant arrhythmias were seen.  MOVEMENTS-PARASOMNIA:  The patient had 157 limb movements, with only 0.4 per hour resulting in arousal or awakening.  There were no abnormal behaviors seen.  IMPRESSIONS-RECOMMENDATIONS: 1. Small numbers of obstructive events, which do not meet the AHI     criteria for the obstructive sleep apnea syndrome. 2. Occasional PVC noted, but no clinically significant arrhythmias     were seen. 3. Large numbers of limb movements, but appeared to have very little     sleep disruption.  Given the patient's history, and the lack of     significant sleep-disordered breathing, consideration should be     given whether this may be impacting his sleep.  Clinical     correlation is suggested.    Barbaraann Share, MD,FCCP Diplomate,  American Board of Sleep Medicine   [facility]/MEDQ  D:  03/20/2013 07:54:17  T:  03/20/2013 08:44:49  Job:  [id number]

## 2013-03-21 NOTE — Telephone Encounter (Signed)
Make sure this pt has a soon upcoming ov to discuss his sleep study.  You should have the report.

## 2013-03-22 NOTE — Telephone Encounter (Signed)
Patient scheduled for 03/31/13 at 4:30 to [facility] to review sleep study Pt requesting if any open spots come open on Monday 8/18 to please call him and let him move his appt to this date. Tuesday 8/19 also patient is interested in having his appt moved here also. Will keep in my box to remind me to check schedule daily

## 2013-03-22 NOTE — Telephone Encounter (Signed)
Pt having hard time scheduling appt d/t interference with other appts scheduled and that fact that they live so far away. Pt states that they are currently in [facility] and have been there for a little while and will be back home to [place] next week. Wife has appt Monday at 1:30 here in [place] and he is wanting to try and have his appt on the same day, in the afternoon. Schedule is fully booked until 430--is there anywhere that patient can be double booked?   Please advise Dr Shelle Iron. Thanks.

## 2013-03-22 NOTE — Telephone Encounter (Signed)
There is no way.  I just added a consult at 430 on Monday. Let pt know I have been out of town, and has led to a full schedule.  Will keep working with him.

## 2013-03-24 ENCOUNTER — Other Ambulatory Visit: Payer: Self-pay | Admitting: Internal Medicine

## 2013-03-27 ENCOUNTER — Ambulatory Visit (INDEPENDENT_AMBULATORY_CARE_PROVIDER_SITE_OTHER): Payer: Medicare HMO | Admitting: Pulmonary Disease

## 2013-03-27 ENCOUNTER — Encounter: Payer: Self-pay | Admitting: Pulmonary Disease

## 2013-03-27 VITALS — BP 128/86 | HR 68 | Temp 97.9°F | Ht 70.0 in | Wt 234.8 lb

## 2013-03-27 DIAGNOSIS — G4761 Periodic limb movement disorder: Secondary | ICD-10-CM

## 2013-03-27 MED ORDER — ROPINIROLE HCL 0.5 MG PO TABS: ORAL_TABLET | ORAL | Status: DC

## 2013-03-27 NOTE — Patient Instructions (Addendum)
Will start requip 0.5mg  one after dinner each night for one week, then increase to 2 after dinner each night. Please call me in 3-4 weeks with your response to treatment.

## 2013-03-27 NOTE — Assessment & Plan Note (Signed)
The patient gives a history for significant sleep disruption, and none were shorted sleep, and daytime sleepiness.  He does not have significant sleep disordered breathing by his sleep study, but does have very large numbers of limb movements.  This is verified by the wife and patient, and I suspect this is the cause of the patient's sleep issues.  I am suspicious that he has the periodic limb movement disorder, although the leg jerks could come from musculoskeletal or neuropathic issues as well.  We'll give him a trial of a dopamine agonist to see if he sees improvement.

## 2013-03-27 NOTE — Progress Notes (Signed)
  Subjective:    Patient ID: [name] [name], male    DOB: 05/15/1946, 67 y.o.   MRN: [id number]  HPI The patient comes in today for followup of his recent sleep study.  Surprisingly, he did not have obstructive sleep apnea, but did have very large numbers of periodic limb movements.  The wife states that he kicks constantly during the night, and the patient states that he is exhausted each morning.  He has had this for quite some time, but denies any history of iron deficiency of late.   Review of Systems  Constitutional: Negative for fever and unexpected weight change.  HENT: Negative for ear pain, nosebleeds, congestion, sore throat, rhinorrhea, sneezing, trouble swallowing, dental problem, postnasal drip and sinus pressure.   Eyes: Negative for redness and itching.  Respiratory: Negative for cough, chest tightness, shortness of breath and wheezing.   Cardiovascular: Negative for palpitations and leg swelling.  Gastrointestinal: Negative for nausea and vomiting.  Genitourinary: Negative for dysuria.  Musculoskeletal: Negative for joint swelling.  Skin: Negative for rash.  Neurological: Negative for headaches.  Hematological: Does not bruise/bleed easily.  Psychiatric/Behavioral: Negative for dysphoric mood. The patient is not nervous/anxious.        Objective:   Physical Exam Well-developed male in no acute distress Nose without purulence or discharge noted Neck without lymphadenopathy or thyromegaly Lower extremities without edema, no cyanosis Alert and oriented, moves all 4 extremities.       Assessment & Plan:

## 2013-03-31 ENCOUNTER — Ambulatory Visit: Payer: Medicare HMO | Admitting: Pulmonary Disease

## 2013-05-09 ENCOUNTER — Telehealth: Payer: Self-pay | Admitting: Pulmonary Disease

## 2013-05-09 NOTE — Telephone Encounter (Signed)
Ok to refill with 6 fills. Need to see him back in 6mos, and if doing well, will turn back over to his primary md.

## 2013-05-09 NOTE — Telephone Encounter (Signed)
Patient Instructions    Will start requip 0.5mg  one after dinner each night for one week, then increase to 2 after dinner each night.  Please call me in 3-4 weeks with your response to treatment.  ----  I spoke with pt. He is taking requip 2 tablets after dinner each night. He stated his restless legs have improved and is resting good at night. He stated his legs have not been bothering him at all. He requesting refill to be [facility] to [facility] [place]. Please advise KC thanks

## 2013-05-10 MED ORDER — ROPINIROLE HCL 0.5 MG PO TABS: ORAL_TABLET | ORAL | Status: DC

## 2013-05-10 NOTE — Telephone Encounter (Signed)
Pt aware and appt scheduled. RX has been sent. Nothing further needed

## 2013-06-12 HISTORY — PX: REPLACEMENT TOTAL KNEE: SUR1224

## 2013-07-04 ENCOUNTER — Telehealth: Payer: Self-pay | Admitting: Cardiology

## 2013-07-04 NOTE — Telephone Encounter (Signed)
Reviewed patient's complaint with Dr. Jens Som who agreed with concern that with recent surgery SOB could be related to pulmonary embolus which needs to be ruled out.  After discussing appointment options and patient's distance from [place], I advised patient to go to the nearest ER in [place] as patient is 2-3 hours from [place].  Patient verbalized understanding and agreement.

## 2013-07-04 NOTE — Telephone Encounter (Signed)
New problem  Pt called states he is SOB often// lasts a while and clears back up over a few days// recently had a knee replacement not sure if it is the new medication that is causing it// please assist.

## 2013-07-04 NOTE — Telephone Encounter (Signed)
Received call directly from [facility].  Patient c/o intermittent SOB onset immediately following knee replacement 11/4 at [facility] in [place].  Patient denies chest discomfort, denies increase of s/s with exertion.  Patient states pain is not reproducable and does not increase with deep breathing.  Patient states he remained on ASA prior to surgery but was told by the [place] that he did not need Levaquin.  Patient states he did not experience SOB prior to the surgery.  I advised patient that since Dr. Jens Som is in the office today that I would speak with him in person and call the patient back.  Patient verbalized understanding and agreement.

## 2013-10-17 ENCOUNTER — Ambulatory Visit: Payer: Medicare HMO | Admitting: Pulmonary Disease

## 2013-10-31 ENCOUNTER — Ambulatory Visit: Payer: Medicare HMO | Admitting: Cardiology

## 2013-11-27 ENCOUNTER — Ambulatory Visit (INDEPENDENT_AMBULATORY_CARE_PROVIDER_SITE_OTHER): Payer: Commercial Managed Care - HMO | Admitting: Cardiology

## 2013-11-27 ENCOUNTER — Encounter: Payer: Self-pay | Admitting: Cardiology

## 2013-11-27 ENCOUNTER — Encounter (INDEPENDENT_AMBULATORY_CARE_PROVIDER_SITE_OTHER): Payer: Self-pay

## 2013-11-27 VITALS — BP 128/78 | HR 58 | Ht 70.0 in | Wt 237.1 lb

## 2013-11-27 DIAGNOSIS — E785 Hyperlipidemia, unspecified: Secondary | ICD-10-CM

## 2013-11-27 DIAGNOSIS — I10 Essential (primary) hypertension: Secondary | ICD-10-CM | POA: Diagnosis not present

## 2013-11-27 DIAGNOSIS — I2589 Other forms of chronic ischemic heart disease: Secondary | ICD-10-CM

## 2013-11-27 DIAGNOSIS — I2510 Atherosclerotic heart disease of native coronary artery without angina pectoris: Secondary | ICD-10-CM

## 2013-11-27 DIAGNOSIS — I428 Other cardiomyopathies: Secondary | ICD-10-CM

## 2013-11-27 MED ORDER — VALSARTAN 160 MG PO TABS: 160.0000 mg | ORAL_TABLET | Freq: Every day | ORAL | Status: DC

## 2013-11-27 MED ORDER — ROSUVASTATIN CALCIUM 5 MG PO TABS: 5.0000 mg | ORAL_TABLET | Freq: Every day | ORAL | Status: DC

## 2013-11-27 MED ORDER — METOPROLOL SUCCINATE ER 25 MG PO TB24: 25.0000 mg | ORAL_TABLET | Freq: Every day | ORAL | Status: DC

## 2013-11-27 NOTE — Assessment & Plan Note (Signed)
Continue present dose of statin. He has not tolerated higher doses in the past.

## 2013-11-27 NOTE — Assessment & Plan Note (Signed)
Continue aspirin and statin. 

## 2013-11-27 NOTE — Assessment & Plan Note (Signed)
Blood pressure controlled. Continue present medications. 

## 2013-11-27 NOTE — Assessment & Plan Note (Signed)
Continue ARB and beta blocker. Check echocardiogram for LV function.

## 2013-11-27 NOTE — Patient Instructions (Signed)

## 2013-11-27 NOTE — Addendum Note (Signed)
Addended by: Cristopher Estimable on: 11/27/2013 03:11 PM   Modules accepted: Orders

## 2013-11-27 NOTE — Progress Notes (Signed)
HPI: FU CAD; history dates back to 1998 when he had a myocardial infarction. He subsequently underwent minimally invasive LIMA to his LAD. An echocardiogram on February 25, 2007, showed an ejection fraction of 40-45%. There was mild mitral regurgitation as well as tricuspid regurgitation. Most recent Myoview was performed in Dec 2013. There was a large anteroapical infarct with no ischemia. We have been treating this medically. I last saw him in Nov 2013. Since then the patient has dyspnea with more extreme activities but not with routine activities. It is relieved with rest. It is not associated with chest pain. There is no orthopnea, PND or pedal edema. There is no syncope or palpitations. There is no exertional chest pain.   Current Outpatient Prescriptions  Medication Sig Dispense Refill  . aspirin 325 MG tablet Take 325 mg by mouth daily.        Marland Kitchen DIOVAN 160 MG tablet take 1 tablet by mouth once daily  90 tablet  2  . metoprolol succinate (TOPROL-XL) 25 MG 24 hr tablet take 1 tablet by mouth once daily  90 tablet  3  . Multiple Vitamins-Minerals (CENTRUM SILVER PO) Take by mouth daily.      . NON FORMULARY Mega Red Daily      . rosuvastatin (CRESTOR) 5 MG tablet Take 1 tablet (5 mg total) by mouth daily.  90 tablet  3   No current facility-administered medications for this visit.     Past Medical History  Diagnosis Date  . Erectile dysfunction   . ISCHEMIC CARDIOMYOPATHY 06/04/2007    Qualifier: Diagnosis of  By: Danny Lawless CMA, [place]    . CORONARY ARTERY DISEASE 06/04/2007    Qualifier: Diagnosis of  By: [place], [place]    . CORONARY ARTERY BYPASS GRAFT, HX OF 06/04/2007    Qualifier: Diagnosis of  By: [place], [place]    . HYPERTENSION 06/04/2007    Qualifier: Diagnosis of  By: [place], [place]    . HYPERLIPIDEMIA 06/04/2007    Qualifier: Diagnosis of  By: Danny Lawless CMA, [place]    . ALLERGIC RHINITIS 10/31/2007    Qualifier: Diagnosis of  By: Jenny Reichmann MD, [place]  ERECTILE DYSFUNCTION 06/04/2007    Qualifier: History of  By: Danny Lawless CMA, [place]    . COLONIC POLYPS, HX OF 10/31/2007    Qualifier: Diagnosis of  By: Jenny Reichmann MD, [place], HX OF 06/04/2007    Qualifier: Diagnosis of  By: Danny Lawless CMA, [place]      Past Surgical History  Procedure Laterality Date  . Coronary artery bypass graft    . Penile prosthesis implant    . Thoracotomy      left anterior; wound exploration and debridement  . Cholecystectomy    . Spinal cyst      History   Social History  . Marital Status: Married    Spouse Name: N/A    Number of Children: 3  . Years of Education: N/A   Occupational History  . truck driver    Social History Main Topics  . Smoking status: Former Smoker -- 4.00 packs/day for 17 years    Types: Cigarettes    Quit date: 05/07/1981  . Smokeless tobacco: Never Used     Comment: Pt states that he would let most of them "burn" pt states that he used anywhere between 4-5PPD  . Alcohol Use: Yes     Comment: rare  . Drug Use: No  .  Sexual Activity: Not on file   Other Topics Concern  . Not on file   Social History Narrative  . No narrative on file    ROS: no fevers or chills, productive cough, hemoptysis, dysphasia, odynophagia, melena, hematochezia, dysuria, hematuria, rash, seizure activity, orthopnea, PND, pedal edema, claudication. Remaining systems are negative.  Physical Exam: Well-developed well-nourished in no acute distress.  Skin is warm and dry.  HEENT is normal.  Neck is supple.  Chest is clear to auscultation with normal expansion.  Cardiovascular exam is regular rate and rhythm.  Abdominal exam nontender or distended. No masses palpated. Extremities show no edema. neuro grossly intact  ECG Sinus rhythm at a rate of 58. Anterior infarct. Left axis deviation.

## 2013-12-08 ENCOUNTER — Telehealth: Payer: Self-pay

## 2013-12-08 DIAGNOSIS — Z0000 Encounter for general adult medical examination without abnormal findings: Secondary | ICD-10-CM

## 2013-12-08 NOTE — Telephone Encounter (Signed)
cpx labs entered  

## 2013-12-11 ENCOUNTER — Encounter: Payer: Self-pay | Admitting: Internal Medicine

## 2013-12-27 ENCOUNTER — Other Ambulatory Visit: Payer: Self-pay | Admitting: *Deleted

## 2013-12-27 NOTE — Telephone Encounter (Signed)
Received fax pt needing Pa on his diovan. Completed PA on cover-my-meds. Received fax med has been approved. The authorization is good through 12/27/15. Notified pharmacy spoke with Vaughan Basta gave approval status...[name] [name]

## 2013-12-29 ENCOUNTER — Encounter: Payer: Medicare HMO | Admitting: Internal Medicine

## 2013-12-29 ENCOUNTER — Ambulatory Visit (HOSPITAL_COMMUNITY): Payer: Medicare HMO | Attending: Cardiology | Admitting: Cardiology

## 2013-12-29 DIAGNOSIS — I428 Other cardiomyopathies: Secondary | ICD-10-CM | POA: Insufficient documentation

## 2013-12-29 DIAGNOSIS — R0602 Shortness of breath: Secondary | ICD-10-CM

## 2013-12-29 DIAGNOSIS — I2510 Atherosclerotic heart disease of native coronary artery without angina pectoris: Secondary | ICD-10-CM

## 2013-12-29 NOTE — Progress Notes (Signed)
Echo performed. 

## 2014-01-02 ENCOUNTER — Encounter: Payer: Self-pay | Admitting: Cardiology

## 2014-01-02 NOTE — Telephone Encounter (Signed)
New message    Calling for test results  

## 2014-01-02 NOTE — Telephone Encounter (Signed)
Left message for pt to call.

## 2014-01-03 ENCOUNTER — Other Ambulatory Visit: Payer: Self-pay | Admitting: *Deleted

## 2014-01-03 DIAGNOSIS — R9430 Abnormal result of cardiovascular function study, unspecified: Secondary | ICD-10-CM

## 2014-01-03 NOTE — Telephone Encounter (Signed)
This encounter was created in error - please disregard.

## 2014-01-19 ENCOUNTER — Other Ambulatory Visit (HOSPITAL_COMMUNITY): Payer: Commercial Managed Care - HMO | Admitting: Cardiology

## 2014-01-19 ENCOUNTER — Ambulatory Visit (HOSPITAL_COMMUNITY): Payer: Medicare HMO | Attending: Cardiology | Admitting: Cardiology

## 2014-01-19 DIAGNOSIS — I2510 Atherosclerotic heart disease of native coronary artery without angina pectoris: Secondary | ICD-10-CM | POA: Insufficient documentation

## 2014-01-19 DIAGNOSIS — I428 Other cardiomyopathies: Secondary | ICD-10-CM | POA: Insufficient documentation

## 2014-01-19 DIAGNOSIS — Z87891 Personal history of nicotine dependence: Secondary | ICD-10-CM | POA: Insufficient documentation

## 2014-01-19 DIAGNOSIS — I10 Essential (primary) hypertension: Secondary | ICD-10-CM | POA: Insufficient documentation

## 2014-01-19 DIAGNOSIS — E785 Hyperlipidemia, unspecified: Secondary | ICD-10-CM | POA: Insufficient documentation

## 2014-01-19 DIAGNOSIS — R0602 Shortness of breath: Secondary | ICD-10-CM

## 2014-01-19 DIAGNOSIS — R9430 Abnormal result of cardiovascular function study, unspecified: Secondary | ICD-10-CM

## 2014-01-19 MED ORDER — PERFLUTREN PROTEIN A MICROSPH IV SUSP
0.5000 mL | Freq: Once | INTRAVENOUS | Status: AC
  Administered 2014-01-19: 1 mL via INTRAVENOUS

## 2014-01-19 NOTE — Progress Notes (Signed)
Limited echo with optison.

## 2014-01-25 ENCOUNTER — Encounter: Payer: Self-pay | Admitting: Gastroenterology

## 2014-02-05 ENCOUNTER — Ambulatory Visit (INDEPENDENT_AMBULATORY_CARE_PROVIDER_SITE_OTHER): Payer: Non-veteran care | Admitting: Internal Medicine

## 2014-02-05 ENCOUNTER — Encounter: Payer: Self-pay | Admitting: Internal Medicine

## 2014-02-05 VITALS — BP 120/64 | HR 60 | Ht 68.5 in | Wt 237.6 lb

## 2014-02-05 DIAGNOSIS — Z86010 Personal history of colonic polyps: Secondary | ICD-10-CM

## 2014-02-05 MED ORDER — NA SULFATE-K SULFATE-MG SULF 17.5-3.13-1.6 GM/177ML PO SOLN: ORAL | Status: DC

## 2014-02-05 NOTE — Assessment & Plan Note (Signed)
Due for surveillance colonoscopy The risks and benefits as well as alternatives of endoscopic procedure(s) have been discussed and reviewed. All questions answered. The patient agrees to proceed.

## 2014-02-05 NOTE — Progress Notes (Signed)
Subjective:    Patient ID: [name] [name], male    DOB: 1945/09/30, 68 y.o.   MRN: [id number]  HPI The patient has a hx of colon polyps. Originally single small adenoma 2005 (me). 2010 Dr. Vernia Buff in [place] via [place] - 1-2 small adenomas. No active GI Sxz.  Allergies  Allergen Reactions  . Ace Inhibitors     REACTION: cough  . Rosuvastatin     REACTION: muscle pains  . Statins     REACTION: muscle pains   Outpatient Prescriptions Prior to Visit  Medication Sig Dispense Refill  . aspirin 325 MG tablet Take 325 mg by mouth daily.        . metoprolol succinate (TOPROL-XL) 25 MG 24 hr tablet Take 1 tablet (25 mg total) by mouth daily.  30 tablet  12  . Multiple Vitamins-Minerals (CENTRUM SILVER PO) Take by mouth daily.      . NON FORMULARY Mega Red Daily      . rosuvastatin (CRESTOR) 5 MG tablet Take 1 tablet (5 mg total) by mouth daily.  30 tablet  12  . valsartan (DIOVAN) 160 MG tablet Take 1 tablet (160 mg total) by mouth daily.  30 tablet  12   No facility-administered medications prior to visit.   Past Medical History  Diagnosis Date  . Erectile dysfunction   . ISCHEMIC CARDIOMYOPATHY 06/04/2007    Qualifier: Diagnosis of  By: Danny Lawless CMA, [place]    . CORONARY ARTERY DISEASE 06/04/2007    Qualifier: Diagnosis of  By: [place], [place]    . CORONARY ARTERY BYPASS GRAFT, HX OF 06/04/2007    Qualifier: Diagnosis of  By: [place], [place]    . HYPERTENSION 06/04/2007    Qualifier: Diagnosis of  By: [place], [place]    . HYPERLIPIDEMIA 06/04/2007    Qualifier: Diagnosis of  By: Danny Lawless CMA, [place]    . ALLERGIC RHINITIS 10/31/2007    Qualifier: Diagnosis of  By: Jenny Reichmann MD, [place] ERECTILE DYSFUNCTION 06/04/2007    Qualifier: History of  By: Danny Lawless CMA, [place]    . COLONIC POLYPS, HX OF 10/31/2007    Qualifier: Diagnosis of  By: Jenny Reichmann MD, [place], HX OF 06/04/2007    Qualifier: Diagnosis of  By: [place], [place]    . NICM  (nonischemic cardiomyopathy)   . PLMD (periodic limb movement disorder)   . OSA (obstructive sleep apnea)   . Hypersomnolence   . Hypothyroidism   . Vitamin D deficiency   . Osteoarthritis   . Vertigo   . Diverticulosis    Past Surgical History  Procedure Laterality Date  . Coronary artery bypass graft    . Penile prosthesis implant    . Thoracotomy      left anterior; wound exploration and debridement  . Cholecystectomy    . Spinal cyst    . Tonsillectomy and adenoidectomy      age 58  . Replacement total knee  06/12/2013  . Colonoscopy     History   Social History  . Marital Status: Married    Spouse Name: N/A    Number of Children: 3  .     Occupational History  . truck driver    Social History Main Topics  . Smoking status: Former Smoker -- 4.00 packs/day for 17 years    Types: Cigarettes    Quit date: 05/07/1981  . Smokeless tobacco: Never Used  Comment: Pt states that he would let most of them "burn" pt states that he used anywhere between 4-5PPD  . Alcohol Use: Yes     Comment: rare  . Drug Use: No    Family History  Problem Relation Age of Onset  . Heart disease Brother     first MI at 45yo, now 18 for transplant list/ ICM  . Stomach cancer Mother   . Lung cancer Father   . Colon cancer Neg Hx     Review of Systems Otherwise negative.    Objective:   Physical Exam General:  NAD Eyes:   anicteric Lungs:  clear Heart:  S1S2 no rubs, murmurs or gallops Abdomen:  soft and mildly tender RLQ, BS+ Ext:   no edema    Data Reviewed:  Prior colonoscopy/pathology     Assessment & Plan:  COLONIC POLYPS, HX OF Due for surveillance colonoscopy The risks and benefits as well as alternatives of endoscopic procedure(s) have been discussed and reviewed. All questions answered. The patient agrees to proceed.

## 2014-02-05 NOTE — Patient Instructions (Addendum)
You have been scheduled for a colonoscopy. Please follow written instructions given to you at your visit today.  Please use the suprep kit you have been given today. If you use inhalers (even only as needed), please bring them with you on the day of your procedure. Your physician has requested that you go to www.startemmi.com and enter the access code given to you at your visit today. This web site gives a general overview about your procedure. However, you should still follow specific instructions given to you by our office regarding your preparation for the procedure.   I appreciate the opportunity to care for you.

## 2014-02-06 ENCOUNTER — Encounter: Payer: Self-pay | Admitting: Internal Medicine

## 2014-02-08 ENCOUNTER — Encounter: Payer: Self-pay | Admitting: Internal Medicine

## 2014-02-15 ENCOUNTER — Encounter: Payer: Self-pay | Admitting: Internal Medicine

## 2014-02-15 ENCOUNTER — Ambulatory Visit (AMBULATORY_SURGERY_CENTER): Payer: Commercial Managed Care - HMO | Admitting: Internal Medicine

## 2014-02-15 VITALS — BP 122/81 | HR 53 | Temp 96.8°F | Resp 17 | Ht 68.5 in | Wt 237.0 lb

## 2014-02-15 DIAGNOSIS — Z1211 Encounter for screening for malignant neoplasm of colon: Secondary | ICD-10-CM | POA: Diagnosis not present

## 2014-02-15 DIAGNOSIS — K5730 Diverticulosis of large intestine without perforation or abscess without bleeding: Secondary | ICD-10-CM | POA: Diagnosis not present

## 2014-02-15 DIAGNOSIS — Z860100 Personal history of colon polyps, unspecified: Secondary | ICD-10-CM | POA: Diagnosis not present

## 2014-02-15 MED ORDER — SODIUM CHLORIDE 0.9 % IV SOLN: 500.0000 mL | INTRAVENOUS | Status: DC

## 2014-02-15 NOTE — Op Note (Signed)
[place]  [address] [place], [address]   COLONOSCOPY PROCEDURE REPORT  PATIENT: [name] [name], [name] [name]  MR#: [id number] BIRTHDATE: 05-May-1946 , 67  yrs. old GENDER: Male ENDOSCOPIST: Gatha Mayer, MD, [facility] PROCEDURE DATE:  02/15/2014 PROCEDURE:   Colonoscopy, surveillance First Screening Colonoscopy - Avg.  risk and is 50 yrs.  old or older - No.  Prior Negative Screening - Now for repeat screening. N/A  History of Adenoma - Now for follow-up colonoscopy & has been > or = to 3 yrs.  Yes hx of adenoma.  Has been 3 or more years since last colonoscopy.  Polyps Removed Today? No.  Recommend repeat exam, <10 yrs? Yes.  High risk (family or personal hx). ASA CLASS:   Class III INDICATIONS:Patient's personal history of adenomatous colon polyps and Last colonoscopy performed 5 years ago. MEDICATIONS: Propofol (Diprivan) 160 mg IV, MAC sedation, administered by CRNA, and These medications were titrated to patient response per physician's verbal order  DESCRIPTION OF PROCEDURE:   After the risks benefits and alternatives of the procedure were thoroughly explained, informed consent was obtained.  A digital rectal exam revealed no abnormalities of the rectum, A digital rectal exam revealed no prostatic nodules, and A digital rectal exam revealed the prostate was not enlarged.   The LB [id number]  endoscope was introduced through the anus and advanced to the cecum, which was identified by both the appendix and ileocecal valve. No adverse events experienced.   The quality of the prep was excellent using Suprep  The instrument was then slowly withdrawn as the colon was fully examined.  COLON FINDINGS: Moderate diverticulosis was noted throughout the entire examined colon.   The colon mucosa was otherwise normal.   A right colon retroflexion was performed.  Retroflexed views revealed no abnormalities. The time to cecum=1 minutes 59 seconds. Withdrawal time=7  minutes 33 seconds.  The scope was withdrawn and the procedure completed. COMPLICATIONS: There were no complications.  ENDOSCOPIC IMPRESSION: 1.   Moderate diverticulosis was noted throughout the entire examined colon 2.   The colon mucosa was otherwise normal - excellent prep - hx adenomas 2005 91) and 2010 (2) all small  RECOMMENDATIONS: Repeat colonoscopy 7 years - 2022.   eSigned:  Gatha Mayer, MD, [facility] 02/15/2014 1:34 PM   cc: The Patient

## 2014-02-15 NOTE — Patient Instructions (Addendum)
No polyps today. You do have a condition called diverticulosis - common and not usually a problem. Please read the handout provided.  Next routine colonoscopy in 7 years - 2022  I appreciate the opportunity to care for you. Gatha Mayer, MD, FACG  YOU HAD AN ENDOSCOPIC PROCEDURE TODAY AT [facility]: Refer to the procedure report that was given to you for any specific questions about what was found during the examination.  If the procedure report does not answer your questions, please call your gastroenterologist to clarify.  If you requested that your care partner not be given the details of your procedure findings, then the procedure report has been included in a sealed envelope for you to review at your convenience later.  YOU SHOULD EXPECT: Some feelings of bloating in the abdomen. Passage of more gas than usual.  Walking can help get rid of the air that was put into your GI tract during the procedure and reduce the bloating. If you had a lower endoscopy (such as a colonoscopy or flexible sigmoidoscopy) you may notice spotting of blood in your stool or on the toilet paper. If you underwent a bowel prep for your procedure, then you may not have a normal bowel movement for a few days.  DIET: Your first meal following the procedure should be a light meal and then it is ok to progress to your normal diet.  A half-sandwich or bowl of soup is an example of a good first meal.  Heavy or fried foods are harder to digest and may make you feel nauseous or bloated.  Likewise meals heavy in dairy and vegetables can cause extra gas to form and this can also increase the bloating.  Drink plenty of fluids but you should avoid alcoholic beverages for 24 hours.  ACTIVITY: Your care partner should take you home directly after the procedure.  You should plan to take it easy, moving slowly for the rest of the day.  You can resume normal activity the day after the procedure however you should NOT  DRIVE or use heavy machinery for 24 hours (because of the sedation medicines used during the test).    SYMPTOMS TO REPORT IMMEDIATELY: A gastroenterologist can be reached at any hour.  During normal business hours, 8:30 AM to 5:00 PM Monday through Friday, call [phone].  After hours and on weekends, please call the GI answering service at [phone] who will take a message and have the physician on call contact you.   Following lower endoscopy (colonoscopy or flexible sigmoidoscopy):  Excessive amounts of blood in the stool  Significant tenderness or worsening of abdominal pains  Swelling of the abdomen that is new, acute  Fever of 100F or higher  FOLLOW UP: If any biopsies were taken you will be contacted by phone or by letter within the next 1-3 weeks.  Call your gastroenterologist if you have not heard about the biopsies in 3 weeks.  Our staff will call the home number listed on your records the next business day following your procedure to check on you and address any questions or concerns that you may have at that time regarding the information given to you following your procedure. This is a courtesy call and so if there is no answer at the home number and we have not heard from you through the emergency physician on call, we will assume that you have returned to your regular daily activities without incident.  SIGNATURES/CONFIDENTIALITY: You and/or  your care partner have signed paperwork which will be entered into your electronic medical record.  These signatures attest to the fact that that the information above on your After Visit Summary has been reviewed and is understood.  Full responsibility of the confidentiality of this discharge information lies with you and/or your care-partner.  Repeat colonoscopy in 7 year-2022.

## 2014-02-15 NOTE — Progress Notes (Signed)
Report to PACU, RN, vss, BBS= Clear.  

## 2014-02-16 ENCOUNTER — Telehealth: Payer: Self-pay

## 2014-02-16 NOTE — Telephone Encounter (Signed)
  Follow up Call-  Call back number 02/15/2014  Post procedure Call Back phone  # [phone] cell  Permission to leave phone message Yes     Patient questions:  Do you have a fever, pain , or abdominal swelling? No. Pain Score  0 *  Have you tolerated food without any problems? Yes.    Have you been able to return to your normal activities? Yes.    Do you have any questions about your discharge instructions: Diet   No. Medications  No. Follow up visit  No.  Do you have questions or concerns about your Care? No.  Actions: * If pain score is 4 or above: No action needed, pain <4.

## 2014-05-08 ENCOUNTER — Other Ambulatory Visit (INDEPENDENT_AMBULATORY_CARE_PROVIDER_SITE_OTHER): Payer: Commercial Managed Care - HMO

## 2014-05-08 DIAGNOSIS — R748 Abnormal levels of other serum enzymes: Secondary | ICD-10-CM | POA: Diagnosis not present

## 2014-05-08 DIAGNOSIS — Z0000 Encounter for general adult medical examination without abnormal findings: Secondary | ICD-10-CM | POA: Diagnosis not present

## 2014-05-08 LAB — CBC WITH DIFFERENTIAL/PLATELET
Basophils Absolute: 0 10*3/uL (ref 0.0–0.1)
Basophils Relative: 0.6 % (ref 0.0–3.0)
EOS PCT: 3 % (ref 0.0–5.0)
Eosinophils Absolute: 0.2 10*3/uL (ref 0.0–0.7)
HEMATOCRIT: 42.4 % (ref 39.0–52.0)
Hemoglobin: 14.5 g/dL (ref 13.0–17.0)
LYMPHS ABS: 1.8 10*3/uL (ref 0.7–4.0)
Lymphocytes Relative: 34.8 % (ref 12.0–46.0)
MCHC: 34.1 g/dL (ref 30.0–36.0)
MCV: 93.8 fl (ref 78.0–100.0)
Monocytes Absolute: 0.4 10*3/uL (ref 0.1–1.0)
Monocytes Relative: 7.8 % (ref 3.0–12.0)
Neutro Abs: 2.8 10*3/uL (ref 1.4–7.7)
Neutrophils Relative %: 53.8 % (ref 43.0–77.0)
Platelets: 162 10*3/uL (ref 150.0–400.0)
RBC: 4.51 Mil/uL (ref 4.22–5.81)
RDW: 12.9 % (ref 11.5–15.5)
WBC: 5.2 10*3/uL (ref 4.0–10.5)

## 2014-05-08 LAB — LIPID PANEL
Cholesterol: 197 mg/dL (ref 0–200)
HDL: 31.6 mg/dL — ABNORMAL LOW (ref >39.00)
NonHDL: 165.4
Total CHOL/HDL Ratio: 6
Triglycerides: 203 mg/dL — ABNORMAL HIGH (ref 0.0–149.0)
VLDL: 40.6 mg/dL — ABNORMAL HIGH (ref 0.0–40.0)

## 2014-05-08 LAB — HEPATIC FUNCTION PANEL
ALBUMIN: 4.2 g/dL (ref 3.5–5.2)
ALK PHOS: 48 U/L (ref 39–117)
ALT: 20 U/L (ref 0–53)
AST: 23 U/L (ref 0–37)
Bilirubin, Direct: 0.1 mg/dL (ref 0.0–0.3)
TOTAL PROTEIN: 7 g/dL (ref 6.0–8.3)
Total Bilirubin: 0.9 mg/dL (ref 0.2–1.2)

## 2014-05-08 LAB — BASIC METABOLIC PANEL
BUN: 19 mg/dL (ref 6–23)
CALCIUM: 9.4 mg/dL (ref 8.4–10.5)
CO2: 30 meq/L (ref 19–32)
Chloride: 104 mEq/L (ref 96–112)
Creatinine, Ser: 1.2 mg/dL (ref 0.4–1.5)
GFR: 65.67 mL/min (ref >60.00)
GLUCOSE: 93 mg/dL (ref 70–99)
Potassium: 4.3 mEq/L (ref 3.5–5.1)
Sodium: 137 mEq/L (ref 135–145)

## 2014-05-08 LAB — TSH: TSH: 5.51 u[IU]/mL — ABNORMAL HIGH (ref 0.35–4.50)

## 2014-05-08 LAB — LDL CHOLESTEROL, DIRECT: LDL DIRECT: 124.7 mg/dL

## 2014-05-08 LAB — PSA: PSA: 1.15 ng/mL (ref 0.10–4.00)

## 2014-05-11 ENCOUNTER — Ambulatory Visit (INDEPENDENT_AMBULATORY_CARE_PROVIDER_SITE_OTHER): Payer: Commercial Managed Care - HMO | Admitting: Internal Medicine

## 2014-05-11 ENCOUNTER — Encounter: Payer: Self-pay | Admitting: Internal Medicine

## 2014-05-11 VITALS — BP 118/72 | HR 63 | Temp 98.0°F | Ht 70.0 in | Wt 241.4 lb

## 2014-05-11 DIAGNOSIS — Z0000 Encounter for general adult medical examination without abnormal findings: Secondary | ICD-10-CM

## 2014-05-11 DIAGNOSIS — E559 Vitamin D deficiency, unspecified: Secondary | ICD-10-CM | POA: Insufficient documentation

## 2014-05-11 DIAGNOSIS — Z23 Encounter for immunization: Secondary | ICD-10-CM

## 2014-05-11 NOTE — Progress Notes (Signed)
   Subjective:    Patient ID: [name] [name], male    DOB: [birth year], 68 y.o.   MRN: [id number]  HPI  Here for wellness and f/u;  Overall doing ok;  Pt denies CP, worsening SOB, DOE, wheezing, orthopnea, PND, worsening LE edema, palpitations, dizziness or syncope.  Pt denies neurological change such as new headache, facial or extremity weakness.  Pt denies polydipsia, polyuria, or low sugar symptoms. Pt states overall good compliance with treatment and medications, good tolerability, and has been trying to follow lower cholesterol diet.  Pt denies worsening depressive symptoms, suicidal ideation or panic. No fever, night sweats, wt loss, loss of appetite, or other constitutional symptoms.  Pt states good ability with ADL's, has low fall risk, home safety reviewed and adequate, no other significant changes in hearing or vision, and fairly active with exercise. Trying to take the statin crestor as he can, with myalgias that act up, so he skips some doses.  S/p right knee TKR at [facility], doing well, has some slight pretibial aching, o/w no pain. Wt Readings from Last 3 Encounters:  05/11/14 241 lb 6 oz (109.487 kg)  02/15/14 237 lb (107.502 kg)  02/05/14 237 lb 9.6 oz (107.775 kg)    Review of Systems Constitutional: Negative for increased diaphoresis, other activity, appetite or other siginficant weight change  HENT: Negative for worsening hearing loss, ear pain, facial swelling, mouth sores and neck stiffness.   Eyes: Negative for other worsening pain, redness or visual disturbance.  Respiratory: Negative for shortness of breath and wheezing.   Cardiovascular: Negative for chest pain and palpitations.  Gastrointestinal: Negative for diarrhea, blood in stool, abdominal distention or other pain Genitourinary: Negative for hematuria, flank pain or change in urine volume.  Musculoskeletal: Negative for myalgias or other joint complaints.  Skin: Negative for color change and wound.  Neurological:  Negative for syncope and numbness. other than noted Hematological: Negative for adenopathy. or other swelling Psychiatric/Behavioral: Negative for hallucinations, self-injury, decreased concentration or other worsening agitation.      Objective:   Physical Exam BP 118/72  Pulse 63  Temp(Src) 98 F (36.7 C) (Oral)  Ht 5\' 10"  (1.778 m)  Wt 241 lb 6 oz (109.487 kg)  BMI 34.63 kg/m2  SpO2 96% VS noted,  Constitutional: Pt is oriented to person, place, and time. Appears well-developed and well-nourished. Annabell Sabal Head: Normocephalic and atraumatic.  Right Ear: External ear normal.  Left Ear: External ear normal.  Nose: Nose normal.  Mouth/Throat: Oropharynx is clear and moist.  Eyes: Conjunctivae and EOM are normal. Pupils are equal, round, and reactive to light.  Neck: Normal range of motion. Neck supple. No JVD present. No tracheal deviation present.  Cardiovascular: Normal rate, regular rhythm, normal heart sounds and intact distal pulses.   Pulmonary/Chest: Effort normal and breath sounds without rales or wheezing  Abdominal: Soft. Bowel sounds are normal. NT. No HSM  Musculoskeletal: Normal range of motion. Exhibits no edema.  Lymphadenopathy:  Has no cervical adenopathy.  Neurological: Pt is alert and oriented to person, place, and time. Pt has normal reflexes. No cranial nerve deficit. Motor grossly intact Skin: Skin is warm and dry. No rash noted.  Psychiatric:  Has normal mood and affect. Behavior is normal.     Assessment & Plan:

## 2014-05-11 NOTE — Patient Instructions (Addendum)
You had the flu shot today  [facility]  Your cholesterol is better, please congtinue the crestor as you can.  Your blood work was otherwise OK.  Please continue all other medications as before, and refills have been done if requested.  Please have the pharmacy call with any other refills you may need.  Please continue your efforts at being more active, low cholesterol diet, and weight control.  You are otherwise up to date with prevention measures today.  Please keep your appointments with your specialists as you may have planned  Please return in 1 year for your yearly visit, or sooner if needed, with Lab testing done 3-5 days before

## 2014-05-11 NOTE — Progress Notes (Signed)
Pre visit review using our clinic review tool, if applicable. No additional management support is needed unless otherwise documented below in the visit note. 

## 2014-05-11 NOTE — Assessment & Plan Note (Signed)

## 2014-11-26 NOTE — Progress Notes (Signed)
HPI: FU CAD; history dates back to 1998 when he had a myocardial infarction. He subsequently underwent minimally invasive LIMA to his LAD. Most recent Myoview was performed in Dec 2013. There was a large anteroapical infarct with no ischemia. We have been treating this medically. Echo May 2015 was technically difficult. Ejection fraction 40-45% and thrombus cannot be excluded. Grade 1 diastolic dysfunction. Mild to moderate left atrial enlargement. Trace mitral and tricuspid regurgitation. Echo repeated in June 2015 and showed no thrombus. Since I last saw him, he notes dyspnea and chest tightness with more vigorous activities. Not routine activities. No orthopnea, PND or pedal edema. Patient had syncope 6-8 months ago while coughing hard.  Current Outpatient Prescriptions  Medication Sig Dispense Refill  . aspirin 325 MG tablet Take 325 mg by mouth daily.      . Coenzyme Q10 (CO Q 10 PO) Take by mouth.    . metoprolol succinate (TOPROL-XL) 25 MG 24 hr tablet Take 1 tablet (25 mg total) by mouth daily. 30 tablet 12  . Multiple Vitamins-Minerals (CENTRUM SILVER PO) Take by mouth daily.    . NON FORMULARY Mega Red Daily    . rosuvastatin (CRESTOR) 5 MG tablet Take 1 tablet (5 mg total) by mouth daily. (Patient taking differently: Take 5 mg by mouth daily. DOES NOT TAKE ON A REGULAR BASIS) 30 tablet 12  . valsartan (DIOVAN) 160 MG tablet Take 1 tablet (160 mg total) by mouth daily. 30 tablet 12   No current facility-administered medications for this visit.     Past Medical History  Diagnosis Date  . Erectile dysfunction   . ISCHEMIC CARDIOMYOPATHY 06/04/2007    Qualifier: Diagnosis of  By: Danny Lawless CMA, [place]    . CORONARY ARTERY DISEASE 06/04/2007    Qualifier: Diagnosis of  By: [place], [place]    . CORONARY ARTERY BYPASS GRAFT, HX OF 06/04/2007    Qualifier: Diagnosis of  By: [place], [place]    . HYPERTENSION 06/04/2007    Qualifier: Diagnosis of  By: [place], [place]      . HYPERLIPIDEMIA 06/04/2007    Qualifier: Diagnosis of  By: Danny Lawless CMA, [place]    . ALLERGIC RHINITIS 10/31/2007    Qualifier: Diagnosis of  By: Jenny Reichmann MD, [place] ERECTILE DYSFUNCTION 06/04/2007    Qualifier: History of  By: Danny Lawless CMA, [place]    . COLONIC POLYPS, HX OF 10/31/2007    Qualifier: Diagnosis of  By: Jenny Reichmann MD, [place], HX OF 06/04/2007    Qualifier: Diagnosis of  By: [place], [place]    . NICM (nonischemic cardiomyopathy)   . PLMD (periodic limb movement disorder)   . OSA (obstructive sleep apnea)   . Hypersomnolence   . Hypothyroidism   . Vitamin D deficiency   . Osteoarthritis   . Vertigo   . Diverticulosis   . Allergy   . Sleep apnea     no cpap  . Myocardial infarction     Past Surgical History  Procedure Laterality Date  . Coronary artery bypass graft    . Penile prosthesis implant    . Thoracotomy      left anterior; wound exploration and debridement  . Cholecystectomy    . Spinal cyst    . Tonsillectomy and adenoidectomy      age 5  . Replacement total knee  06/12/2013  . Colonoscopy      History   Social History  .  Marital Status: Married    Spouse Name: N/A  . Number of Children: 3  . Years of Education: N/A   Occupational History  . truck driver    Social History Main Topics  . Smoking status: Former Smoker -- 4.00 packs/day for 17 years    Types: Cigarettes    Quit date: 05/07/1981  . Smokeless tobacco: Never Used     Comment: Pt states that he would let most of them "burn" pt states that he used anywhere between 4-5PPD  . Alcohol Use: 0.0 oz/week    0 Standard drinks or equivalent per week     Comment: rare  . Drug Use: No  . Sexual Activity: Not on file   Other Topics Concern  . Not on file   Social History Narrative    ROS: no fevers or chills, productive cough, hemoptysis, dysphasia, odynophagia, melena, hematochezia, dysuria, hematuria, rash, seizure activity, orthopnea, PND, pedal edema,  claudication. Remaining systems are negative.  Physical Exam: Well-developed well-nourished in no acute distress.  Skin is warm and dry.  HEENT is normal.  Neck is supple.  Chest is clear to auscultation with normal expansion.  Cardiovascular exam is regular rate and rhythm.  Abdominal exam nontender or distended. No masses palpated. Extremities show no edema. neuro grossly intact  ECG sinus rhythm at a rate of 65. Prior anterior infarct.

## 2014-11-29 ENCOUNTER — Ambulatory Visit (INDEPENDENT_AMBULATORY_CARE_PROVIDER_SITE_OTHER): Payer: Commercial Managed Care - HMO | Admitting: Cardiology

## 2014-11-29 ENCOUNTER — Encounter: Payer: Self-pay | Admitting: *Deleted

## 2014-11-29 ENCOUNTER — Encounter: Payer: Self-pay | Admitting: Cardiology

## 2014-11-29 VITALS — BP 160/80 | HR 65 | Ht 71.0 in | Wt 237.0 lb

## 2014-11-29 DIAGNOSIS — I10 Essential (primary) hypertension: Secondary | ICD-10-CM | POA: Diagnosis not present

## 2014-11-29 DIAGNOSIS — I2510 Atherosclerotic heart disease of native coronary artery without angina pectoris: Secondary | ICD-10-CM

## 2014-11-29 MED ORDER — METOPROLOL SUCCINATE ER 50 MG PO TB24: 50.0000 mg | ORAL_TABLET | Freq: Every day | ORAL | Status: DC

## 2014-11-29 NOTE — Assessment & Plan Note (Signed)
Blood pressure elevated. Increase Toprol to 50 mg daily.

## 2014-11-29 NOTE — Assessment & Plan Note (Signed)
Continue statin. Lipids and liver monitored at the [place].

## 2014-11-29 NOTE — Assessment & Plan Note (Signed)
Continue ARB and beta blocker. 

## 2014-11-29 NOTE — Patient Instructions (Signed)
Your physician wants you to follow-up in: [place] will receive a reminder letter in the mail two months in advance. If you don't receive a letter, please call our office to schedule the follow-up appointment.   INCREASE METOPROLOL TO 50 MG ONCE DAILY=2 OF THE 25 MG TABLETS ONCE DAILY  Your physician has requested that you have a lexiscan myoview. For further information please visit [email or web address]. Please follow instruction sheet, as given.

## 2014-11-29 NOTE — Assessment & Plan Note (Signed)
Continue aspirin and statin. He has noticed increased dyspnea on exertion and chest tightness with vigorous activities. Schedule nuclear study.

## 2014-11-30 ENCOUNTER — Telehealth: Payer: Self-pay | Admitting: Cardiology

## 2014-11-30 ENCOUNTER — Other Ambulatory Visit: Payer: Self-pay | Admitting: Cardiology

## 2014-11-30 NOTE — Telephone Encounter (Signed)
Spoke with pt, aware refills sent to the pharmacy 11-28-14

## 2014-11-30 NOTE — Telephone Encounter (Signed)
Pt saw Dr [name] yesterday, wants to know if his medicine have been called in? If not please call them to [facility].

## 2014-12-06 ENCOUNTER — Telehealth (HOSPITAL_COMMUNITY): Payer: Self-pay

## 2014-12-06 NOTE — Telephone Encounter (Signed)
Encounter complete. 

## 2014-12-07 ENCOUNTER — Telehealth (HOSPITAL_COMMUNITY): Payer: Self-pay

## 2014-12-07 NOTE — Telephone Encounter (Signed)
Encounter complete. 

## 2014-12-11 ENCOUNTER — Ambulatory Visit (HOSPITAL_COMMUNITY)
Admission: RE | Admit: 2014-12-11 | Discharge: 2014-12-11 | Disposition: A | Discharge status: Home / Self Care | Payer: Commercial Managed Care - HMO | Source: Ambulatory Visit | Attending: Cardiology | Admitting: Cardiology

## 2014-12-11 DIAGNOSIS — I2510 Atherosclerotic heart disease of native coronary artery without angina pectoris: Secondary | ICD-10-CM | POA: Diagnosis not present

## 2014-12-11 DIAGNOSIS — R9439 Abnormal result of other cardiovascular function study: Secondary | ICD-10-CM | POA: Diagnosis not present

## 2014-12-11 MED ORDER — TECHNETIUM TC 99M SESTAMIBI GENERIC - CARDIOLITE
10.5000 | Freq: Once | INTRAVENOUS | Status: AC | PRN
  Administered 2014-12-11: 11 via INTRAVENOUS

## 2014-12-11 MED ORDER — TECHNETIUM TC 99M SESTAMIBI GENERIC - CARDIOLITE
31.8000 | Freq: Once | INTRAVENOUS | Status: AC | PRN
  Administered 2014-12-11: 31.8 via INTRAVENOUS

## 2014-12-11 MED ORDER — REGADENOSON 0.4 MG/5ML IV SOLN
0.4000 mg | Freq: Once | INTRAVENOUS | Status: AC
  Administered 2014-12-11: 0.4 mg via INTRAVENOUS

## 2014-12-12 ENCOUNTER — Encounter: Payer: Self-pay | Admitting: Cardiology

## 2014-12-13 LAB — MYOCARDIAL PERFUSION IMAGING
CHL CUP NUCLEAR SDS: 0
CHL CUP NUCLEAR SRS: 21
CHL CUP NUCLEAR SSS: 21
CHL CUP STRESS STAGE 2 GRADE: 0 %
CHL CUP STRESS STAGE 3 HR: 68 {beats}/min
CHL CUP STRESS STAGE 3 SPEED: 0 mph
CSEPPHR: 68 {beats}/min
Estimated workload: 1 METS
LV sys vol: 94 mL
LVDIAVOL: 157 mL
Nuc Stress EF: 40 %
Percent of predicted max HR: 45 %
Rest HR: 55 {beats}/min
Stage 1 Grade: 0 %
Stage 1 HR: 53 {beats}/min
Stage 1 Speed: 0 mph
Stage 2 HR: 53 {beats}/min
Stage 2 Speed: 0 mph
Stage 3 Grade: 0 %
Stage 4 Grade: 0 %
Stage 4 HR: 60 {beats}/min
Stage 4 Speed: 0 mph
TID: 1.17

## 2014-12-14 ENCOUNTER — Telehealth: Payer: Self-pay | Admitting: Cardiology

## 2014-12-14 NOTE — Telephone Encounter (Signed)
Pt would like to know if his stress test results are back from 12-11-14 please.

## 2014-12-14 NOTE — Telephone Encounter (Signed)
Called and spoke to patient, informed him no change from last result. He voiced understanding.

## 2014-12-18 ENCOUNTER — Encounter (HOSPITAL_COMMUNITY): Payer: Self-pay | Admitting: *Deleted

## 2015-01-01 ENCOUNTER — Other Ambulatory Visit: Payer: Self-pay | Admitting: Cardiology

## 2015-01-05 ENCOUNTER — Other Ambulatory Visit: Payer: Self-pay | Admitting: Cardiology

## 2015-05-07 ENCOUNTER — Other Ambulatory Visit (INDEPENDENT_AMBULATORY_CARE_PROVIDER_SITE_OTHER): Payer: Commercial Managed Care - HMO

## 2015-05-07 ENCOUNTER — Other Ambulatory Visit: Payer: Self-pay | Admitting: Cardiology

## 2015-05-07 DIAGNOSIS — Z0000 Encounter for general adult medical examination without abnormal findings: Secondary | ICD-10-CM | POA: Diagnosis not present

## 2015-05-07 LAB — CBC WITH DIFFERENTIAL/PLATELET
BASOS ABS: 0 10*3/uL (ref 0.0–0.1)
Basophils Relative: 0.9 % (ref 0.0–3.0)
Eosinophils Absolute: 0.3 10*3/uL (ref 0.0–0.7)
Eosinophils Relative: 5.1 % — ABNORMAL HIGH (ref 0.0–5.0)
HEMATOCRIT: 41.4 % (ref 39.0–52.0)
HEMOGLOBIN: 14 g/dL (ref 13.0–17.0)
LYMPHS PCT: 32.1 % (ref 12.0–46.0)
Lymphs Abs: 1.6 10*3/uL (ref 0.7–4.0)
MCHC: 33.7 g/dL (ref 30.0–36.0)
MCV: 94.2 fl (ref 78.0–100.0)
MONOS PCT: 8.1 % (ref 3.0–12.0)
Monocytes Absolute: 0.4 10*3/uL (ref 0.1–1.0)
NEUTROS ABS: 2.7 10*3/uL (ref 1.4–7.7)
Neutrophils Relative %: 53.8 % (ref 43.0–77.0)
Platelets: 140 10*3/uL — ABNORMAL LOW (ref 150.0–400.0)
RBC: 4.4 Mil/uL (ref 4.22–5.81)
RDW: 13.4 % (ref 11.5–15.5)
WBC: 5.1 10*3/uL (ref 4.0–10.5)

## 2015-05-07 LAB — LIPID PANEL
CHOL/HDL RATIO: 4
Cholesterol: 166 mg/dL (ref 0–200)
HDL: 39.6 mg/dL (ref >39.00)
LDL Cholesterol: 107 mg/dL — ABNORMAL HIGH (ref 0–99)
NonHDL: 126.02
Triglycerides: 93 mg/dL (ref 0.0–149.0)
VLDL: 18.6 mg/dL (ref 0.0–40.0)

## 2015-05-07 LAB — BASIC METABOLIC PANEL
BUN: 19 mg/dL (ref 6–23)
CALCIUM: 9.2 mg/dL (ref 8.4–10.5)
CO2: 31 meq/L (ref 19–32)
CREATININE: 1.04 mg/dL (ref 0.40–1.50)
Chloride: 103 mEq/L (ref 96–112)
GFR: 75.02 mL/min (ref >60.00)
Glucose, Bld: 101 mg/dL — ABNORMAL HIGH (ref 70–99)
Potassium: 4.5 mEq/L (ref 3.5–5.1)
Sodium: 139 mEq/L (ref 135–145)

## 2015-05-07 LAB — URINALYSIS, ROUTINE W REFLEX MICROSCOPIC
BILIRUBIN URINE: NEGATIVE
HGB URINE DIPSTICK: NEGATIVE
Ketones, ur: NEGATIVE
LEUKOCYTES UA: NEGATIVE
NITRITE: NEGATIVE
Specific Gravity, Urine: >=1.03 — AB (ref 1.000–1.030)
URINE GLUCOSE: NEGATIVE
UROBILINOGEN UA: 1 (ref 0.0–1.0)
pH: 5.5 (ref 5.0–8.0)

## 2015-05-07 LAB — HEPATIC FUNCTION PANEL
ALT: 15 U/L (ref 0–53)
AST: 17 U/L (ref 0–37)
Albumin: 4.1 g/dL (ref 3.5–5.2)
Alkaline Phosphatase: 45 U/L (ref 39–117)
BILIRUBIN TOTAL: 0.9 mg/dL (ref 0.2–1.2)
Bilirubin, Direct: 0.2 mg/dL (ref 0.0–0.3)
TOTAL PROTEIN: 6.7 g/dL (ref 6.0–8.3)

## 2015-05-07 LAB — PSA: PSA: 1.26 ng/mL (ref 0.10–4.00)

## 2015-05-07 LAB — TSH: TSH: 4.54 u[IU]/mL — ABNORMAL HIGH (ref 0.35–4.50)

## 2015-05-14 ENCOUNTER — Ambulatory Visit (INDEPENDENT_AMBULATORY_CARE_PROVIDER_SITE_OTHER): Payer: Commercial Managed Care - HMO | Admitting: Internal Medicine

## 2015-05-14 ENCOUNTER — Encounter: Payer: Self-pay | Admitting: Internal Medicine

## 2015-05-14 VITALS — BP 122/82 | HR 69 | Temp 97.9°F | Ht 71.0 in | Wt 239.0 lb

## 2015-05-14 DIAGNOSIS — I2510 Atherosclerotic heart disease of native coronary artery without angina pectoris: Secondary | ICD-10-CM

## 2015-05-14 DIAGNOSIS — Z0000 Encounter for general adult medical examination without abnormal findings: Secondary | ICD-10-CM | POA: Diagnosis not present

## 2015-05-14 DIAGNOSIS — Z23 Encounter for immunization: Secondary | ICD-10-CM

## 2015-05-14 DIAGNOSIS — Z0189 Encounter for other specified special examinations: Secondary | ICD-10-CM

## 2015-05-14 DIAGNOSIS — I10 Essential (primary) hypertension: Secondary | ICD-10-CM

## 2015-05-14 NOTE — Assessment & Plan Note (Signed)

## 2015-05-14 NOTE — Progress Notes (Addendum)
Subjective:    Patient ID: [name] [name], male    DOB: Jun 27, 1946, 69 y.o.   MRN: [id number]  HPI  Here for wellness and f/u;  Overall doing ok;  Pt denies Chest pain, worsening SOB, DOE, wheezing, orthopnea, PND, worsening LE edema, palpitations, dizziness or syncope.  Pt denies neurological change such as new headache, facial or extremity weakness.  Pt denies polydipsia, polyuria, or low sugar symptoms. Pt states overall good compliance with treatment and medications, good tolerability, and has been trying to follow appropriate diet.  Pt denies worsening depressive symptoms, suicidal ideation or panic. No fever, night sweats, wt loss, loss of appetite, or other constitutional symptoms.  Pt states good ability with ADL's, has low fall risk, home safety reviewed and adequate, no other significant changes in hearing or vision, and occasionally active with exercise, with hunting and fishing several times per wk. Sees cardiology regularly, doing well s/p cabg 1998.  Has had signficant statin intolerance in past, can only tolerate 5 mg crestor per pt due to myalgias Past Medical History  Diagnosis Date  . Erectile dysfunction   . ISCHEMIC CARDIOMYOPATHY 06/04/2007    Qualifier: Diagnosis of  By: Danny Lawless CMA, [place]    . CORONARY ARTERY DISEASE 06/04/2007    Qualifier: Diagnosis of  By: [place], [place]    . CORONARY ARTERY BYPASS GRAFT, HX OF 06/04/2007    Qualifier: Diagnosis of  By: [place], [place]    . HYPERTENSION 06/04/2007    Qualifier: Diagnosis of  By: [place], [place]    . HYPERLIPIDEMIA 06/04/2007    Qualifier: Diagnosis of  By: Danny Lawless CMA, [place]    . ALLERGIC RHINITIS 10/31/2007    Qualifier: Diagnosis of  By: Jenny Reichmann MD, [place] ERECTILE DYSFUNCTION 06/04/2007    Qualifier: History of  By: Danny Lawless CMA, [place]    . COLONIC POLYPS, HX OF 10/31/2007    Qualifier: Diagnosis of  By: Jenny Reichmann MD, [place], HX OF 06/04/2007    Qualifier: Diagnosis of  By:  [place], [place]    . NICM (nonischemic cardiomyopathy) ([place])   . PLMD (periodic limb movement disorder)   . OSA (obstructive sleep apnea)   . Hypersomnolence   . Hypothyroidism   . Vitamin D deficiency   . Osteoarthritis   . Vertigo   . Diverticulosis   . Allergy   . Sleep apnea     no cpap  . Myocardial infarction [facility])    Past Surgical History  Procedure Laterality Date  . Coronary artery bypass graft    . Penile prosthesis implant    . Thoracotomy      left anterior; wound exploration and debridement  . Cholecystectomy    . Spinal cyst    . Tonsillectomy and adenoidectomy      age 79  . Replacement total knee  06/12/2013  . Colonoscopy      reports that he quit smoking about 34 years ago. His smoking use included Cigarettes. He has a 68 pack-year smoking history. He has never used smokeless tobacco. He reports that he drinks alcohol. He reports that he does not use illicit drugs. family history includes Colon cancer in his maternal grandfather; Heart disease in his brother; Lung cancer in his father; Stomach cancer in his mother. There is no history of Esophageal cancer, Pancreatic cancer, Prostate cancer, or Rectal cancer. Allergies  Allergen Reactions  . Ace Inhibitors     REACTION: cough  .  Rosuvastatin     REACTION: muscle pains  . Statins     REACTION: muscle pains   Current Outpatient Prescriptions on File Prior to Visit  Medication Sig Dispense Refill  . aspirin 325 MG tablet Take 325 mg by mouth daily.      . Coenzyme Q10 (CO Q 10 PO) Take by mouth.    . CRESTOR 5 MG tablet take 1 tablet by mouth once daily 30 tablet 5  . metoprolol succinate (TOPROL-XL) 50 MG 24 hr tablet Take 1 tablet (50 mg total) by mouth daily. 90 tablet 3  . Multiple Vitamins-Minerals (CENTRUM SILVER PO) Take by mouth daily.    . NON FORMULARY Mega Red Daily    . valsartan (DIOVAN) 160 MG tablet take 1 tablet by mouth once daily 30 tablet 6   No current facility-administered  medications on file prior to visit.   Review of Systems Constitutional: Negative for increased diaphoresis, other activity, appetite or siginficant weight change other than noted HENT: Negative for worsening hearing loss, ear pain, facial swelling, mouth sores and neck stiffness.   Eyes: Negative for other worsening pain, redness or visual disturbance.  Respiratory: Negative for shortness of breath and wheezing  Cardiovascular: Negative for chest pain and palpitations.  Gastrointestinal: Negative for diarrhea, blood in stool, abdominal distention or other pain Genitourinary: Negative for hematuria, flank pain or change in urine volume.  Musculoskeletal: Negative for myalgias or other joint complaints.  Skin: Negative for color change and wound or drainage.  Neurological: Negative for syncope and numbness. other than noted Hematological: Negative for adenopathy. or other swelling Psychiatric/Behavioral: Negative for hallucinations, SI, self-injury, decreased concentration or other worsening agitation.      Objective:   Physical Exam BP 122/82 mmHg  Pulse 69  Temp(Src) 97.9 F (36.6 C) (Oral)  Ht 5\' 11"  (1.803 m)  Wt 239 lb (108.41 kg)  BMI 33.35 kg/m2  SpO2 98% VS noted,  Constitutional: Pt is oriented to person, place, and time. Appears well-developed and well-nourished, in no significant distress Head: Normocephalic and atraumatic.  Right Ear: External ear normal.  Left Ear: External ear normal.  Nose: Nose normal.  Mouth/Throat: Oropharynx is clear and moist.  Eyes: Conjunctivae and EOM are normal. Pupils are equal, round, and reactive to light.  Neck: Normal range of motion. Neck supple. No JVD present. No tracheal deviation present or significant neck LA or mass Cardiovascular: Normal rate, regular rhythm, normal heart sounds and intact distal pulses.   Pulmonary/Chest: Effort normal and breath sounds without rales or wheezing  Abdominal: Soft. Bowel sounds are normal. NT.  No HSM  Musculoskeletal: Normal range of motion. Exhibits no edema.  Lymphadenopathy:  Has no cervical adenopathy.  Neurological: Pt is alert and oriented to person, place, and time. Pt has normal reflexes. No cranial nerve deficit. Motor grossly intact Skin: Skin is warm and dry. No rash noted.  Psychiatric:  Has normal mood and affect. Behavior is normal.     Assessment & Plan:

## 2015-05-14 NOTE — Patient Instructions (Addendum)
You had the new Prevnar pneumonia shot today  Please continue all other medications as before, and refills have been done if requested.  Please have the pharmacy call with any other refills you may need.  Please continue your efforts at being more active, low cholesterol diet, and weight control.  You are otherwise up to date with prevention measures today.  Please keep your appointments with your specialists as you may have planned  Please return in 6 months, or sooner if needed

## 2015-05-14 NOTE — Assessment & Plan Note (Signed)
stable overall by history and exam, recent data reviewed with pt, and pt to continue medical treatment as before,  to f/u any worsening symptoms or concerns BP Readings from Last 3 Encounters:  05/14/15 122/82  11/29/14 160/80  05/11/14 118/72

## 2015-05-14 NOTE — Addendum Note (Signed)
Addended by: Lyman Bishop on: 05/14/2015 09:46 AM   Modules accepted: Orders

## 2015-05-14 NOTE — Progress Notes (Signed)
Pre visit review using our clinic review tool, if applicable. No additional management support is needed unless otherwise documented below in the visit note. 

## 2015-05-14 NOTE — Addendum Note (Signed)
Addended by: Biagio Borg on: 05/14/2015 09:27 AM   Modules accepted: Miquel Dunn

## 2015-06-10 ENCOUNTER — Telehealth: Payer: Self-pay | Admitting: Cardiology

## 2015-06-10 MED ORDER — ROSUVASTATIN CALCIUM 5 MG PO TABS: 5.0000 mg | ORAL_TABLET | ORAL | Status: DC

## 2015-06-10 NOTE — Telephone Encounter (Signed)
Change crestor to 5 mg po QOD to see if tolerates. Kirk Ruths

## 2015-06-10 NOTE — Telephone Encounter (Signed)
[name] [name] is calling because when he takes his Rosuvastain [place] 5mg  . He gets shortness of breath ,. Please call   Thanks

## 2015-06-10 NOTE — Telephone Encounter (Signed)
Spoke with pt, Aware of dr crenshaw's recommendations.  °

## 2015-06-10 NOTE — Telephone Encounter (Signed)
Returned call to patient who states he has taken crestor but can only take it "for so long". He states he now takes generic crestor and claims he has been short of breath when on the medication. He has been off the rosuvastatin for 2 weeks and is better. Was still taking statin at the time of last lipid 9/27.   He states he was in the hospital and they were giving him his statin every day and he told the nursing staff he was short of breath and all they did was give him oxygen.   "Couldn't walk" when taking other statins.   When he was taking brand-name crestor he was taking about every 7 days with a week break in between.   Routed to MD for advice (?)

## 2015-06-11 DIAGNOSIS — H5210 Myopia, unspecified eye: Secondary | ICD-10-CM | POA: Diagnosis not present

## 2015-06-11 DIAGNOSIS — H5203 Hypermetropia, bilateral: Secondary | ICD-10-CM | POA: Diagnosis not present

## 2015-09-05 ENCOUNTER — Telehealth: Payer: Self-pay | Admitting: Internal Medicine

## 2015-09-05 NOTE — Telephone Encounter (Signed)
Pt has appt at Cardiology and needs [facility]  referral    March 16th at 4:30

## 2015-09-12 NOTE — Telephone Encounter (Signed)
Mcarthur Rossetti Josem Kaufmann  [id number] valid 10/24/2015 - 04/21/2016 for 6 visits

## 2015-10-15 NOTE — Progress Notes (Signed)
HPI: FU CAD; history dates back to 1998 when he had a myocardial infarction. He subsequently underwent minimally invasive LIMA to his LAD. Echo May 2015 was technically difficult. Ejection fraction 40-45% and thrombus cannot be excluded. Grade 1 diastolic dysfunction. Mild to moderate left atrial enlargement. Trace mitral and tricuspid regurgitation. Echo repeated in June 2015 and showed no thrombus. Nuclear study May 2016 Showed ejection fraction 40%, prior anterior apical infarct but no ischemia. Since I last saw him, the patient has dyspnea with more extreme activities but not with routine activities. It is relieved with rest. It is not associated with chest pain. There is no orthopnea, PND or pedal edema. There is no syncope or palpitations. There is no exertional chest pain.   Current Outpatient Prescriptions  Medication Sig Dispense Refill  . aspirin 325 MG tablet Take 325 mg by mouth daily.      . metoprolol succinate (TOPROL-XL) 50 MG 24 hr tablet Take 1 tablet (50 mg total) by mouth daily. 90 tablet 3  . rosuvastatin (CRESTOR) 5 MG tablet Take 1 tablet (5 mg total) by mouth every other day. (Patient taking differently: Take 5 mg by mouth 2 (two) times a week. ) 30 tablet 5  . valsartan (DIOVAN) 160 MG tablet take 1 tablet by mouth once daily 30 tablet 6   No current facility-administered medications for this visit.     Past Medical History  Diagnosis Date  . Erectile dysfunction   . ISCHEMIC CARDIOMYOPATHY 06/04/2007    Qualifier: Diagnosis of  By: Danny Lawless CMA, [place]    . CORONARY ARTERY DISEASE 06/04/2007    Qualifier: Diagnosis of  By: [place], [place]    . CORONARY ARTERY BYPASS GRAFT, HX OF 06/04/2007    Qualifier: Diagnosis of  By: [place], [place]    . HYPERTENSION 06/04/2007    Qualifier: Diagnosis of  By: [place], [place]    . HYPERLIPIDEMIA 06/04/2007    Qualifier: Diagnosis of  By: Danny Lawless CMA, [place]    . ALLERGIC RHINITIS 10/31/2007    Qualifier:  Diagnosis of  By: Jenny Reichmann MD, [place] ERECTILE DYSFUNCTION 06/04/2007    Qualifier: History of  By: Danny Lawless CMA, [place]    . COLONIC POLYPS, HX OF 10/31/2007    Qualifier: Diagnosis of  By: Jenny Reichmann MD, [place], HX OF 06/04/2007    Qualifier: Diagnosis of  By: [place], [place]    . NICM (nonischemic cardiomyopathy) ([place])   . PLMD (periodic limb movement disorder)   . OSA (obstructive sleep apnea)   . Hypersomnolence   . Hypothyroidism   . Vitamin D deficiency   . Osteoarthritis   . Vertigo   . Diverticulosis   . Allergy   . Sleep apnea     no cpap  . Myocardial infarction [facility])     Past Surgical History  Procedure Laterality Date  . Coronary artery bypass graft    . Penile prosthesis implant    . Thoracotomy      left anterior; wound exploration and debridement  . Cholecystectomy    . Spinal cyst    . Tonsillectomy and adenoidectomy      age 45  . Replacement total knee  06/12/2013  . Colonoscopy      Social History   Social History  . Marital Status: Married    Spouse Name: N/A  . Number of Children: 3  . Years of Education: N/A   Occupational  History  . truck driver    Social History Main Topics  . Smoking status: Former Smoker -- 4.00 packs/day for 17 years    Types: Cigarettes    Quit date: 05/07/1981  . Smokeless tobacco: Never Used     Comment: Pt states that he would let most of them "burn" pt states that he used anywhere between 4-5PPD  . Alcohol Use: 0.0 oz/week    0 Standard drinks or equivalent per week     Comment: rare  . Drug Use: No  . Sexual Activity: Not on file   Other Topics Concern  . Not on file   Social History Narrative    Family History  Problem Relation Age of Onset  . Heart disease Brother     first MI at 69yo, now 27 for transplant list/ ICM  . Stomach cancer Mother   . Lung cancer Father   . Esophageal cancer Neg Hx   . Pancreatic cancer Neg Hx   . Prostate cancer Neg Hx   . Rectal cancer Neg Hx     . Colon cancer Maternal Grandfather     ROS: no fevers or chills, productive cough, hemoptysis, dysphasia, odynophagia, melena, hematochezia, dysuria, hematuria, rash, seizure activity, orthopnea, PND, pedal edema, claudication. Remaining systems are negative.  Physical Exam: Well-developed well-nourished in no acute distress.  Skin is warm and dry.  HEENT is normal.  Neck is supple.  Chest is clear to auscultation with normal expansion.  Cardiovascular exam is regular rate and rhythm.  Abdominal exam nontender or distended. No masses palpated. Extremities show no edema. neuro grossly intact  ECG Normal sinus rhythm at a rate of 64. Left axis deviation. Anterior infarct.

## 2015-10-24 ENCOUNTER — Encounter: Payer: Self-pay | Admitting: Cardiology

## 2015-10-24 ENCOUNTER — Ambulatory Visit (INDEPENDENT_AMBULATORY_CARE_PROVIDER_SITE_OTHER): Payer: Commercial Managed Care - HMO | Admitting: Cardiology

## 2015-10-24 VITALS — BP 126/76 | HR 64 | Ht 70.0 in | Wt 233.0 lb

## 2015-10-24 DIAGNOSIS — I10 Essential (primary) hypertension: Secondary | ICD-10-CM

## 2015-10-24 DIAGNOSIS — I2510 Atherosclerotic heart disease of native coronary artery without angina pectoris: Secondary | ICD-10-CM | POA: Diagnosis not present

## 2015-10-24 MED ORDER — METOPROLOL SUCCINATE ER 50 MG PO TB24: 50.0000 mg | ORAL_TABLET | Freq: Every day | ORAL | Status: DC

## 2015-10-24 MED ORDER — VALSARTAN 160 MG PO TABS: 160.0000 mg | ORAL_TABLET | Freq: Every day | ORAL | Status: DC

## 2015-10-24 NOTE — Patient Instructions (Signed)
Medication Instructions:   NO CHANGE  Labwork:  Your physician recommends that you return for lab work WHEN FASTING  Follow-Up:  Your physician wants you to follow-up in: ONE YEAR WITH DR CRENSHAW You will receive a reminder letter in the mail two months in advance. If you don't receive a letter, please call our office to schedule the follow-up appointment.   If you need a refill on your cardiac medications before your next appointment, please call your pharmacy.    

## 2015-10-24 NOTE — Assessment & Plan Note (Signed)
Patient can only tolerate a statin on 2 days of the week. We will check lipids and liver. If LDL greater than 70 I will add Zetia. We could also consider repatha in the future.

## 2015-10-24 NOTE — Assessment & Plan Note (Signed)
Blood pressure controlled. Continue present care. Check potassium and renal function.

## 2015-10-24 NOTE — Assessment & Plan Note (Signed)
Continue aspirin and statin. 

## 2015-10-24 NOTE — Assessment & Plan Note (Signed)
Continue beta blocker and ARB. 

## 2015-10-25 ENCOUNTER — Telehealth: Payer: Self-pay | Admitting: *Deleted

## 2015-10-25 DIAGNOSIS — E785 Hyperlipidemia, unspecified: Secondary | ICD-10-CM

## 2015-10-25 LAB — BASIC METABOLIC PANEL
BUN: 21 mg/dL (ref 7–25)
CO2: 30 mmol/L (ref 20–31)
Calcium: 9.7 mg/dL (ref 8.6–10.3)
Chloride: 100 mmol/L (ref 98–110)
Creat: 1.13 mg/dL (ref 0.70–1.18)
GLUCOSE: 105 mg/dL — AB (ref 65–99)
POTASSIUM: 4.7 mmol/L (ref 3.5–5.3)
SODIUM: 138 mmol/L (ref 135–146)

## 2015-10-25 LAB — LIPID PANEL
CHOL/HDL RATIO: 4.8 ratio (ref <=5.0)
CHOLESTEROL: 192 mg/dL (ref 125–200)
HDL: 40 mg/dL (ref >=40)
LDL CALC: 119 mg/dL (ref <130)
Triglycerides: 166 mg/dL — ABNORMAL HIGH (ref <150)
VLDL: 33 mg/dL — AB (ref <30)

## 2015-10-25 LAB — HEPATIC FUNCTION PANEL
ALT: 14 U/L (ref 9–46)
AST: 15 U/L (ref 10–35)
Albumin: 4.4 g/dL (ref 3.6–5.1)
Alkaline Phosphatase: 56 U/L (ref 40–115)
BILIRUBIN DIRECT: 0.1 mg/dL (ref <=0.2)
Indirect Bilirubin: 0.6 mg/dL (ref 0.2–1.2)
TOTAL PROTEIN: 7 g/dL (ref 6.1–8.1)
Total Bilirubin: 0.7 mg/dL (ref 0.2–1.2)

## 2015-10-25 MED ORDER — EZETIMIBE 10 MG PO TABS: 10.0000 mg | ORAL_TABLET | Freq: Every day | ORAL | Status: DC

## 2015-10-25 NOTE — Telephone Encounter (Signed)
Spoke with pt, Aware of dr crenshaw's recommendations.  Lab orders mailed to the pt  

## 2015-10-25 NOTE — Telephone Encounter (Signed)
-----   Message from [name], MD sent at 10/25/2015  6:53 AM EDT ----- zetia 10 mg daily, lipids and liver 4 weeks [facility] [place]

## 2015-12-16 ENCOUNTER — Other Ambulatory Visit: Payer: Self-pay

## 2015-12-16 DIAGNOSIS — I2510 Atherosclerotic heart disease of native coronary artery without angina pectoris: Secondary | ICD-10-CM

## 2015-12-16 MED ORDER — METOPROLOL SUCCINATE ER 50 MG PO TB24: 50.0000 mg | ORAL_TABLET | Freq: Every day | ORAL | Status: DC

## 2016-02-20 ENCOUNTER — Other Ambulatory Visit: Payer: Self-pay

## 2016-02-20 MED ORDER — ROSUVASTATIN CALCIUM 5 MG PO TABS: 5.0000 mg | ORAL_TABLET | ORAL | Status: DC

## 2016-02-25 ENCOUNTER — Other Ambulatory Visit: Payer: Self-pay | Admitting: *Deleted

## 2016-02-25 MED ORDER — ROSUVASTATIN CALCIUM 5 MG PO TABS: 5.0000 mg | ORAL_TABLET | ORAL | Status: DC

## 2016-02-25 NOTE — Telephone Encounter (Signed)
Patient stated that the pharmacy never received a new rx for this.

## 2016-02-27 ENCOUNTER — Telehealth: Payer: Self-pay | Admitting: Cardiology

## 2016-02-27 MED ORDER — ROSUVASTATIN CALCIUM 5 MG PO TABS: 5.0000 mg | ORAL_TABLET | ORAL | Status: DC

## 2016-02-27 NOTE — Telephone Encounter (Signed)
New message      Pt states pharmacy does not have his generic crestor presc.  According to the computer----it is on "no print".  [facility] resend [facility]c to [facility] at [address].  Pt also want 37yr of refills.

## 2016-02-27 NOTE — Telephone Encounter (Signed)
Rx resent in the correct manner and patient notified.

## 2016-05-21 ENCOUNTER — Other Ambulatory Visit: Payer: Commercial Managed Care - HMO

## 2016-05-21 DIAGNOSIS — Z0000 Encounter for general adult medical examination without abnormal findings: Secondary | ICD-10-CM

## 2016-05-22 LAB — HEPATITIS C ANTIBODY: HCV AB: NEGATIVE

## 2016-05-28 ENCOUNTER — Encounter: Payer: Self-pay | Admitting: Internal Medicine

## 2016-05-28 ENCOUNTER — Ambulatory Visit (INDEPENDENT_AMBULATORY_CARE_PROVIDER_SITE_OTHER): Payer: Commercial Managed Care - HMO | Admitting: Internal Medicine

## 2016-05-28 ENCOUNTER — Other Ambulatory Visit (INDEPENDENT_AMBULATORY_CARE_PROVIDER_SITE_OTHER): Payer: Commercial Managed Care - HMO

## 2016-05-28 VITALS — BP 138/78 | HR 65 | Temp 97.9°F | Resp 20 | Wt 232.1 lb

## 2016-05-28 DIAGNOSIS — Z0000 Encounter for general adult medical examination without abnormal findings: Secondary | ICD-10-CM | POA: Diagnosis not present

## 2016-05-28 LAB — HEPATIC FUNCTION PANEL
ALK PHOS: 49 U/L (ref 39–117)
ALT: 17 U/L (ref 0–53)
AST: 17 U/L (ref 0–37)
Albumin: 4.4 g/dL (ref 3.5–5.2)
BILIRUBIN DIRECT: 0.1 mg/dL (ref 0.0–0.3)
TOTAL PROTEIN: 7.2 g/dL (ref 6.0–8.3)
Total Bilirubin: 0.7 mg/dL (ref 0.2–1.2)

## 2016-05-28 LAB — BASIC METABOLIC PANEL
BUN: 20 mg/dL (ref 6–23)
CALCIUM: 9.6 mg/dL (ref 8.4–10.5)
CO2: 32 meq/L (ref 19–32)
Chloride: 100 mEq/L (ref 96–112)
Creatinine, Ser: 1.18 mg/dL (ref 0.40–1.50)
GFR: 64.65 mL/min (ref >60.00)
GLUCOSE: 99 mg/dL (ref 70–99)
POTASSIUM: 4.7 meq/L (ref 3.5–5.1)
SODIUM: 138 meq/L (ref 135–145)

## 2016-05-28 LAB — CBC WITH DIFFERENTIAL/PLATELET
BASOS ABS: 0 10*3/uL (ref 0.0–0.1)
BASOS PCT: 0.6 % (ref 0.0–3.0)
EOS ABS: 0.1 10*3/uL (ref 0.0–0.7)
Eosinophils Relative: 1.7 % (ref 0.0–5.0)
HEMATOCRIT: 43.1 % (ref 39.0–52.0)
HEMOGLOBIN: 15 g/dL (ref 13.0–17.0)
LYMPHS PCT: 30.5 % (ref 12.0–46.0)
Lymphs Abs: 1.6 10*3/uL (ref 0.7–4.0)
MCHC: 34.8 g/dL (ref 30.0–36.0)
MCV: 90.5 fl (ref 78.0–100.0)
MONOS PCT: 7 % (ref 3.0–12.0)
Monocytes Absolute: 0.4 10*3/uL (ref 0.1–1.0)
NEUTROS ABS: 3.2 10*3/uL (ref 1.4–7.7)
Neutrophils Relative %: 60.2 % (ref 43.0–77.0)
PLATELETS: 161 10*3/uL (ref 150.0–400.0)
RBC: 4.76 Mil/uL (ref 4.22–5.81)
RDW: 13.7 % (ref 11.5–15.5)
WBC: 5.3 10*3/uL (ref 4.0–10.5)

## 2016-05-28 LAB — URINALYSIS, ROUTINE W REFLEX MICROSCOPIC
HGB URINE DIPSTICK: NEGATIVE
KETONES UR: NEGATIVE
Leukocytes, UA: NEGATIVE
NITRITE: NEGATIVE
Specific Gravity, Urine: >=1.03 — AB (ref 1.000–1.030)
TOTAL PROTEIN, URINE-UPE24: NEGATIVE
URINE GLUCOSE: NEGATIVE
UROBILINOGEN UA: 0.2 (ref 0.0–1.0)
pH: 5 (ref 5.0–8.0)

## 2016-05-28 LAB — LIPID PANEL
CHOLESTEROL: 203 mg/dL — AB (ref 0–200)
HDL: 41.5 mg/dL (ref >39.00)
LDL CALC: 125 mg/dL — AB (ref 0–99)
NonHDL: 161.84
TRIGLYCERIDES: 185 mg/dL — AB (ref 0.0–149.0)
Total CHOL/HDL Ratio: 5
VLDL: 37 mg/dL (ref 0.0–40.0)

## 2016-05-28 LAB — TSH: TSH: 4.55 u[IU]/mL — ABNORMAL HIGH (ref 0.35–4.50)

## 2016-05-28 LAB — PSA: PSA: 0.84 ng/mL (ref 0.10–4.00)

## 2016-05-28 NOTE — Progress Notes (Signed)
Pre visit review using our clinic review tool, if applicable. No additional management support is needed unless otherwise documented below in the visit note. 

## 2016-05-28 NOTE — Patient Instructions (Addendum)

## 2016-05-28 NOTE — Progress Notes (Signed)
Subjective:    Patient ID: [name] [name], male    DOB: [birth year], 70 y.o.   MRN: [id number]  HPI   Here for wellness and f/u;  Overall doing ok;  Pt denies Chest pain, worsening SOB, DOE, wheezing, orthopnea, PND, worsening LE edema, palpitations, dizziness or syncope.  Pt denies neurological change such as new headache, facial or extremity weakness.  Pt denies polydipsia, polyuria, or low sugar symptoms. Pt states overall good compliance with treatment and medications, good tolerability, and has been trying to follow appropriate diet.  Pt denies worsening depressive symptoms, suicidal ideation or panic. No fever, night sweats, wt loss, loss of appetite, or other constitutional symptoms.  Pt states good ability with ADL's, has low fall risk, home safety reviewed and adequate, no other significant changes in hearing or vision, and only occasionally active with exercise. No other hx changes or additions, Also seen at [place] once yearly. Taking the statin now twice weekly, as this is tolerable.  C/o right TKR doing ok x 2 yrs, now worsening left knee pain, s/p cortisone per ortho at [facility], pain returning so will get gel shot soon, trying to avoid replacement.  Has known chronic medial meniscus tear (left).  Has some near falls without injury.   Past Medical History:  Diagnosis Date  . ALLERGIC RHINITIS 10/31/2007   Qualifier: Diagnosis of  By: Jenny Reichmann MD, [place]is   . Allergy   . CHOLECYSTECTOMY, HX OF 06/04/2007   Qualifier: Diagnosis of  By: Danny Lawless CMA, [place]    . COLONIC POLYPS, HX OF 10/31/2007   Qualifier: Diagnosis of  By: Jenny Reichmann MD, [place], HX OF 06/04/2007   Qualifier: Diagnosis of  By: [place], [place]    . CORONARY ARTERY DISEASE 06/04/2007   Qualifier: Diagnosis of  By: [place], [place]    . Diverticulosis   . Erectile dysfunction   . ERECTILE DYSFUNCTION 06/04/2007   Qualifier: History of  By: Danny Lawless CMA, [place]    . HYPERLIPIDEMIA 06/04/2007   Qualifier: Diagnosis of  By: Danny Lawless CMA, [place]    . Hypersomnolence   . HYPERTENSION 06/04/2007   Qualifier: Diagnosis of  By: [place], [place]    . Hypothyroidism   . ISCHEMIC CARDIOMYOPATHY 06/04/2007   Qualifier: Diagnosis of  By: Danny Lawless CMA, [place]    . Myocardial infarction   . NICM (nonischemic cardiomyopathy) ([place])   . OSA (obstructive sleep apnea)   . Osteoarthritis   . PLMD (periodic limb movement disorder)   . Sleep apnea    no cpap  . Vertigo   . Vitamin D deficiency    Past Surgical History:  Procedure Laterality Date  . CHOLECYSTECTOMY    . COLONOSCOPY    . CORONARY ARTERY BYPASS GRAFT    . PENILE PROSTHESIS IMPLANT    . REPLACEMENT TOTAL KNEE  06/12/2013  . spinal cyst    . THORACOTOMY     left anterior; wound exploration and debridement  . TONSILLECTOMY AND ADENOIDECTOMY     age 89    reports that he quit smoking about 35 years ago. His smoking use included Cigarettes. He has a 68.00 pack-year smoking history. He has never used smokeless tobacco. He reports that he drinks alcohol. He reports that he does not use drugs. family history includes Colon cancer in his maternal grandfather; Heart disease in his brother; Lung cancer in his father; Stomach cancer in his mother. Allergies  Allergen Reactions  . Ace  Inhibitors     REACTION: cough  . Rosuvastatin     REACTION: muscle pains  . Statins     REACTION: muscle pains   Current Outpatient Prescriptions on File Prior to Visit  Medication Sig Dispense Refill  . aspirin 325 MG tablet Take 325 mg by mouth daily.      . metoprolol succinate (TOPROL-XL) 50 MG 24 hr tablet Take 1 tablet (50 mg total) by mouth daily. 90 tablet 3  . rosuvastatin (CRESTOR) 5 MG tablet Take 1 tablet (5 mg total) by mouth 2 (two) times a week. 30 tablet 5  . valsartan (DIOVAN) 160 MG tablet Take 1 tablet (160 mg total) by mouth daily. 90 tablet 3   No current facility-administered medications on file prior to visit.    Review of  Systems VS noted,  Constitutional: Pt is oriented to person, place, and time. Appears well-developed and well-nourished, in no significant distress Head: Normocephalic and atraumatic  Eyes: Conjunctivae and EOM are normal. Pupils are equal, round, and reactive to light Right Ear: External ear normal.  Left Ear: External ear normal Nose: Nose normal.  Mouth/Throat: Oropharynx is clear and moist  Neck: Normal range of motion. Neck supple. No JVD present. No tracheal deviation present or significant neck LA or mass Cardiovascular: Normal rate, regular rhythm, normal heart sounds and intact distal pulses.   Pulmonary/Chest: Effort normal and breath sounds without rales or wheezing  Abdominal: Soft. Bowel sounds are normal. NT. No HSM  Musculoskeletal: Normal range of motion. Exhibits no edema Lymphadenopathy: Has no cervical adenopathy.  Neurological: Pt is alert and oriented to person, place, and time. Pt has normal reflexes. No cranial nerve deficit. Motor grossly intact Skin: Skin is warm and dry. No rash noted or new ulcers Psychiatric:  Has normal mood and affect. Behavior is normal.  No other signficant changes    Objective:   Physical Exam BP 138/78   Pulse 65   Temp 97.9 F (36.6 C) (Oral)   Resp 20   Wt 232 lb 2 oz (105.3 kg)   SpO2 97%   BMI 33.31 kg/m  VS noted, not ill appearing Constitutional: Pt is oriented to person, place, and time. Appears well-developed and well-nourished, in no significant distress Head: Normocephalic and atraumatic  Eyes: Conjunctivae and EOM are normal. Pupils are equal, round, and reactive to light Right Ear: External ear normal.  Left Ear: External ear normal Nose: Nose normal.  Mouth/Throat: Oropharynx is clear and moist  Neck: Normal range of motion. Neck supple. No JVD present. No tracheal deviation present or significant neck LA or mass Cardiovascular: Normal rate, regular rhythm, normal heart sounds and intact distal pulses.     Pulmonary/Chest: Effort normal and breath sounds without rales or wheezing  Abdominal: Soft. Bowel sounds are normal. NT. No HSM  Musculoskeletal: Normal range of motion. Exhibits no edema Lymphadenopathy: Has no cervical adenopathy.  Neurological: Pt is alert and oriented to person, place, and time. Pt has normal reflexes. No cranial nerve deficit. Motor grossly intact Skin: Skin is warm and dry. No rash noted or new ulcers Psychiatric:  Has normal mood and affect. Behavior is normal.  No other significant changes in exam    Assessment & Plan:

## 2016-05-31 NOTE — Assessment & Plan Note (Signed)

## 2016-06-09 ENCOUNTER — Telehealth: Payer: Self-pay | Admitting: Emergency Medicine

## 2016-06-09 NOTE — Telephone Encounter (Signed)
Pt called and wants to know if you can call him about his labs. Please advise thanks.

## 2016-06-09 NOTE — Telephone Encounter (Signed)
Patient aware will be contacted once labs are released

## 2016-06-23 ENCOUNTER — Telehealth: Payer: Self-pay

## 2016-06-23 NOTE — Telephone Encounter (Signed)
Not sure why there was no letter, but all was essentially normal, and did not require any medication change.  Pt should work on lower cholesterol diet as LDL was mild elevated, and cont all other medications.

## 2016-06-23 NOTE — Telephone Encounter (Signed)
Please advise patient called and is wanting results from lab work on the 19th of October. Results state final but there is no interpretation.

## 2016-06-24 NOTE — Telephone Encounter (Signed)
Patient aware.

## 2016-06-30 ENCOUNTER — Other Ambulatory Visit: Payer: Self-pay

## 2016-06-30 DIAGNOSIS — I2510 Atherosclerotic heart disease of native coronary artery without angina pectoris: Secondary | ICD-10-CM

## 2016-06-30 MED ORDER — METOPROLOL SUCCINATE ER 50 MG PO TB24: 50.0000 mg | ORAL_TABLET | Freq: Every day | ORAL | 3 refills | Status: DC

## 2016-06-30 MED ORDER — ROSUVASTATIN CALCIUM 5 MG PO TABS
5.0000 mg | ORAL_TABLET | ORAL | 5 refills | Status: DC
Start: 2016-07-02 — End: 2016-07-01

## 2016-06-30 MED ORDER — VALSARTAN 160 MG PO TABS: 160.0000 mg | ORAL_TABLET | Freq: Every day | ORAL | 3 refills | Status: DC

## 2016-07-01 ENCOUNTER — Other Ambulatory Visit: Payer: Self-pay

## 2016-07-01 DIAGNOSIS — I2510 Atherosclerotic heart disease of native coronary artery without angina pectoris: Secondary | ICD-10-CM

## 2016-07-01 MED ORDER — METOPROLOL SUCCINATE ER 50 MG PO TB24: 50.0000 mg | ORAL_TABLET | Freq: Every day | ORAL | 2 refills | Status: DC

## 2016-07-01 MED ORDER — ROSUVASTATIN CALCIUM 5 MG PO TABS: 5.0000 mg | ORAL_TABLET | ORAL | 5 refills | Status: DC

## 2016-07-01 MED ORDER — VALSARTAN 160 MG PO TABS: 160.0000 mg | ORAL_TABLET | Freq: Every day | ORAL | 2 refills | Status: DC

## 2016-07-07 ENCOUNTER — Other Ambulatory Visit: Payer: Self-pay | Admitting: Cardiology

## 2016-07-07 DIAGNOSIS — I2510 Atherosclerotic heart disease of native coronary artery without angina pectoris: Secondary | ICD-10-CM

## 2016-07-07 MED ORDER — ROSUVASTATIN CALCIUM 5 MG PO TABS: 5.0000 mg | ORAL_TABLET | ORAL | 5 refills | Status: DC

## 2016-07-07 MED ORDER — METOPROLOL SUCCINATE ER 50 MG PO TB24: 50.0000 mg | ORAL_TABLET | Freq: Every day | ORAL | 2 refills | Status: DC

## 2016-07-07 MED ORDER — VALSARTAN 160 MG PO TABS: 160.0000 mg | ORAL_TABLET | Freq: Every day | ORAL | 2 refills | Status: DC

## 2016-07-07 NOTE — Telephone Encounter (Signed)
Refill sent to the pharmacy electronically.  

## 2016-07-07 NOTE — Telephone Encounter (Signed)
°*  STAT* If patient is at the pharmacy, call can be transferred to refill team.   1. Which medications need to be refilled? (please list name of each medication and dose if known) Metorpolol,Rosuvastatin and Valsartan 2. Which pharmacy/location (including street and city if local pharmacy) is medication to be sent to? [facility] -[phone]  3. Do they need a 30 day or 90 day supply? 90 for each and refills

## 2016-08-25 DIAGNOSIS — H5203 Hypermetropia, bilateral: Secondary | ICD-10-CM | POA: Diagnosis not present

## 2016-09-10 ENCOUNTER — Ambulatory Visit (INDEPENDENT_AMBULATORY_CARE_PROVIDER_SITE_OTHER): Payer: Medicare HMO | Admitting: Internal Medicine

## 2016-09-10 VITALS — BP 136/74 | HR 90 | Temp 98.8°F | Resp 20 | Wt 216.0 lb

## 2016-09-10 DIAGNOSIS — I10 Essential (primary) hypertension: Secondary | ICD-10-CM | POA: Diagnosis not present

## 2016-09-10 DIAGNOSIS — R059 Cough, unspecified: Secondary | ICD-10-CM | POA: Insufficient documentation

## 2016-09-10 DIAGNOSIS — R0609 Other forms of dyspnea: Secondary | ICD-10-CM

## 2016-09-10 DIAGNOSIS — R05 Cough: Secondary | ICD-10-CM | POA: Diagnosis not present

## 2016-09-10 DIAGNOSIS — J111 Influenza due to unidentified influenza virus with other respiratory manifestations: Secondary | ICD-10-CM | POA: Insufficient documentation

## 2016-09-10 DIAGNOSIS — R0689 Other abnormalities of breathing: Secondary | ICD-10-CM

## 2016-09-10 DIAGNOSIS — R0600 Dyspnea, unspecified: Secondary | ICD-10-CM | POA: Insufficient documentation

## 2016-09-10 MED ORDER — ALBUTEROL SULFATE HFA 108 (90 BASE) MCG/ACT IN AERS: 2.0000 | INHALATION_SPRAY | Freq: Four times a day (QID) | RESPIRATORY_TRACT | 2 refills | Status: DC | PRN

## 2016-09-10 MED ORDER — LEVOFLOXACIN 500 MG PO TABS: 500.0000 mg | ORAL_TABLET | Freq: Every day | ORAL | 0 refills | Status: AC

## 2016-09-10 MED ORDER — HYDROCODONE-HOMATROPINE 5-1.5 MG/5ML PO SYRP: 5.0000 mL | ORAL_SOLUTION | Freq: Four times a day (QID) | ORAL | 0 refills | Status: AC | PRN

## 2016-09-10 MED ORDER — PREDNISONE 10 MG PO TABS: ORAL_TABLET | ORAL | 0 refills | Status: DC

## 2016-09-10 NOTE — Patient Instructions (Signed)
You had the steroid shot today, and antibiotic shot (rocephin)   Please take all new medication as prescribed - the antibiotic pill, cough medicine if needed, prednisone, and inhaler as needed  Please continue all other medications as before, and refills have been done if requested.  Please have the pharmacy call with any other refills you may need.  Please keep your appointments with your specialists as you may have planned  Please go to the XRAY Department in the Basement (go straight as you get off the elevator) for the x-ray testing tomorrow  You will be contacted by phone if any changes need to be made immediately.  Otherwise, you will receive a letter about your results with an explanation, but please check with MyChart first.  Please remember to sign up for MyChart if you have not done so, as this will be important to you in the future with finding out test results, communicating by private email, and scheduling acute appointments online when needed.

## 2016-09-10 NOTE — Progress Notes (Signed)
Pre visit review using our clinic review tool, if applicable. No additional management support is needed unless otherwise documented below in the visit note. 

## 2016-09-10 NOTE — Progress Notes (Signed)
Subjective:    Patient ID: [name] [name], male    DOB: 1945-08-30, 71 y.o.   MRN: [id number]  HPI  Here to f/u recent influenza, was seen and tested + for influenza jan 24 at [facility] per pt, given inhaler prn, but no other specific tx.  Since then become worsened with fever, ST, cough, sob/doe, wheezing, and breathlessness with talking, though no accessory muscle use.  Pt denies chest pain, orthopnea, PND, increased LE swelling, palpitations, dizziness or syncope.  Pt denies new neurological symptoms such as new headache, or facial or extremity weakness or numbness   Pt denies polydipsia, polyuria Past Medical History:  Diagnosis Date  . ALLERGIC RHINITIS 10/31/2007   Qualifier: Diagnosis of  By: Jenny Reichmann MD, [place]   . Allergy   . CHOLECYSTECTOMY, HX OF 06/04/2007   Qualifier: Diagnosis of  By: Danny Lawless CMA, [place]    . COLONIC POLYPS, HX OF 10/31/2007   Qualifier: Diagnosis of  By: Jenny Reichmann MD, [place], HX OF 06/04/2007   Qualifier: Diagnosis of  By: [place], [place]    . CORONARY ARTERY DISEASE 06/04/2007   Qualifier: Diagnosis of  By: [place], [place]    . Diverticulosis   . Erectile dysfunction   . ERECTILE DYSFUNCTION 06/04/2007   Qualifier: History of  By: Danny Lawless CMA, [place]    . HYPERLIPIDEMIA 06/04/2007   Qualifier: Diagnosis of  By: Danny Lawless CMA, [place]    . Hypersomnolence   . HYPERTENSION 06/04/2007   Qualifier: Diagnosis of  By: [place], [place]    . Hypothyroidism   . ISCHEMIC CARDIOMYOPATHY 06/04/2007   Qualifier: Diagnosis of  By: Danny Lawless CMA, [place]    . Myocardial infarction   . NICM (nonischemic cardiomyopathy) ([place])   . OSA (obstructive sleep apnea)   . Osteoarthritis   . PLMD (periodic limb movement disorder)   . Sleep apnea    no cpap  . Vertigo   . Vitamin D deficiency    Past Surgical History:  Procedure Laterality Date  . CHOLECYSTECTOMY    . COLONOSCOPY    . CORONARY ARTERY BYPASS GRAFT    . PENILE PROSTHESIS  IMPLANT    . REPLACEMENT TOTAL KNEE  06/12/2013  . spinal cyst    . THORACOTOMY     left anterior; wound exploration and debridement  . TONSILLECTOMY AND ADENOIDECTOMY     age 64    reports that he quit smoking about 35 years ago. His smoking use included Cigarettes. He has a 68.00 pack-year smoking history. He has never used smokeless tobacco. He reports that he drinks alcohol. He reports that he does not use drugs. family history includes Colon cancer in his maternal grandfather; Heart disease in his brother; Lung cancer in his father; Stomach cancer in his mother. Allergies  Allergen Reactions  . Ace Inhibitors     REACTION: cough  . Rosuvastatin     REACTION: muscle pains  . Statins     REACTION: muscle pains   Current Outpatient Prescriptions on File Prior to Visit  Medication Sig Dispense Refill  . aspirin 325 MG tablet Take 325 mg by mouth daily.      . metoprolol succinate (TOPROL-XL) 50 MG 24 hr tablet Take 1 tablet (50 mg total) by mouth daily. 90 tablet 2  . rosuvastatin (CRESTOR) 5 MG tablet Take 1 tablet (5 mg total) by mouth 2 (two) times a week. 30 tablet 5  . valsartan (DIOVAN) 160 MG  tablet Take 1 tablet (160 mg total) by mouth daily. 90 tablet 2   No current facility-administered medications on file prior to visit.    Review of Systems  Constitutional: Negative for unusual diaphoresis or night sweats HENT: Negative for ear swelling or discharge Eyes: Negative for worsening visual haziness  Respiratory: Negative for choking and stridor.   Gastrointestinal: Negative for distension or worsening eructation Genitourinary: Negative for retention or change in urine volume.  Musculoskeletal: Negative for other MSK pain or swelling Skin: Negative for color change and worsening wound Neurological: Negative for tremors and numbness other than noted  Psychiatric/Behavioral: Negative for decreased concentration or agitation other than above   All other system neg per pt      Objective:   Physical Exam BP 136/74   Pulse 90   Temp 98.8 F (37.1 C) (Oral)   Resp 20   Wt 216 lb (98 kg)   SpO2 98%   BMI 30.99 kg/m  VS noted, mild to mod ill, fatigued, breathless to talk, no accessory muscle use Constitutional: Pt appears in no apparent distress HENT: Head: NCAT.  Right Ear: External ear normal.  Left Ear: External ear normal.  Eyes: . Pupils are equal, round, and reactive to light. Conjunctivae and EOM are normal Neck: Normal range of motion. Neck supple.  Cardiovascular: Normal rate and regular rhythm.   Pulmonary/Chest: Effort normal and breath sounds decreased bilat but worse right base, without rales or rhonch Neurological: Pt is alert. Not confused , motor grossly intact Skin: Skin is warm. No rash, no LE edema Psychiatric: Pt behavior is normal. No agitation. ] No other new exam findings    Assessment & Plan:

## 2016-09-11 ENCOUNTER — Ambulatory Visit (INDEPENDENT_AMBULATORY_CARE_PROVIDER_SITE_OTHER)
Admission: RE | Admit: 2016-09-11 | Discharge: 2016-09-11 | Disposition: A | Discharge status: Home / Self Care | Payer: Medicare HMO | Source: Ambulatory Visit | Attending: Internal Medicine | Admitting: Internal Medicine

## 2016-09-11 DIAGNOSIS — R05 Cough: Secondary | ICD-10-CM

## 2016-09-11 DIAGNOSIS — R0609 Other forms of dyspnea: Secondary | ICD-10-CM

## 2016-09-11 DIAGNOSIS — R0689 Other abnormalities of breathing: Secondary | ICD-10-CM

## 2016-09-11 DIAGNOSIS — J111 Influenza due to unidentified influenza virus with other respiratory manifestations: Secondary | ICD-10-CM

## 2016-09-11 DIAGNOSIS — R059 Cough, unspecified: Secondary | ICD-10-CM

## 2016-09-11 NOTE — Assessment & Plan Note (Addendum)
Mild to mod, c/w bronchitis vs pna, for antibx po course, cough med prn,  to f/u any worsening symptoms or concerns

## 2016-09-11 NOTE — Assessment & Plan Note (Addendum)
?   PNA, for empiric rocephin IM 1 gm, cxr in AM,  to f/u any worsening symptoms or concerns

## 2016-09-11 NOTE — Assessment & Plan Note (Signed)
Cant r/o pna, also with mild wheezing, for depomedrol IM 80, predpac asd,  to f/u any worsening symptoms or concerns

## 2016-09-11 NOTE — Assessment & Plan Note (Signed)
Non tamiflu candidate at this point, follow

## 2016-09-11 NOTE — Assessment & Plan Note (Signed)
stable overall by history and exam, recent data reviewed with pt, and pt to continue medical treatment as before,  to f/u any worsening symptoms or concerns BP Readings from Last 3 Encounters:  09/10/16 136/74  05/28/16 138/78  10/24/15 126/76

## 2016-09-29 ENCOUNTER — Ambulatory Visit (INDEPENDENT_AMBULATORY_CARE_PROVIDER_SITE_OTHER): Payer: Medicare HMO | Admitting: Internal Medicine

## 2016-09-29 ENCOUNTER — Encounter: Payer: Self-pay | Admitting: Internal Medicine

## 2016-09-29 DIAGNOSIS — R10A1 Flank pain, right side: Secondary | ICD-10-CM | POA: Insufficient documentation

## 2016-09-29 DIAGNOSIS — R109 Unspecified abdominal pain: Secondary | ICD-10-CM

## 2016-09-29 LAB — POC URINALSYSI DIPSTICK (AUTOMATED)
Bilirubin, UA: NEGATIVE
Blood, UA: NEGATIVE
Glucose, UA: NEGATIVE
Ketones, UA: NEGATIVE
LEUKOCYTES UA: NEGATIVE
Nitrite, UA: NEGATIVE
Spec Grav, UA: >=1.03
UROBILINOGEN UA: 1
pH, UA: 5

## 2016-09-29 MED ORDER — CYCLOBENZAPRINE HCL 5 MG PO TABS: 5.0000 mg | ORAL_TABLET | Freq: Three times a day (TID) | ORAL | 0 refills | Status: DC | PRN

## 2016-09-29 NOTE — Assessment & Plan Note (Addendum)
Likely muscular from recent cold and coughing. Checked U/A in the office to rule out kidney stone. (no blood in the urine) Rx for flexeril for pain.

## 2016-09-29 NOTE — Patient Instructions (Signed)
There are no signs of infection or kidney stone in the urine.   This is likely a muscle strain and we have sent in flexeril which is the muscle medicine. You can take it up to 3 times per day as needed for pain.

## 2016-09-29 NOTE — Progress Notes (Signed)
   Subjective:    Patient ID: [name] [name], male    DOB: 1946-04-03, 71 y.o.   MRN: [id number]  HPI The patient is a 71 YO man coming in for side pain. Treated for bad cough and breathing about 2 weeks ago (treated with antibiotics, steroids, cough medicine and albuterol). He has worse pain with twisting or deep breathing. This pain has showed up about twice and not hurting much now. The pain lasted about 1 hour for the worst and about 10 minutes the other time. He did not take anything for it. 7/10 pain at the worst. Now just mild 2/10 pain with twisting. Mildly sore to the touch. Overall cold symptoms are improving but still some coughing. Denies any other abdominal problems (see ROS).   Review of Systems  Constitutional: Positive for fatigue. Negative for activity change, appetite change, fever and unexpected weight change.  HENT: Positive for congestion. Negative for postnasal drip, rhinorrhea, sinus pain, sinus pressure, sore throat and tinnitus.   Eyes: Negative.   Respiratory: Positive for cough. Negative for chest tightness, shortness of breath and wheezing.   Cardiovascular: Negative.   Gastrointestinal: Positive for abdominal pain. Negative for abdominal distention, constipation, diarrhea, nausea and vomiting.  Musculoskeletal: Negative.   Neurological: Negative.       Objective:   Physical Exam  Constitutional: He is oriented to person, place, and time. He appears well-developed and well-nourished.  HENT:  Head: Normocephalic and atraumatic.  Right Ear: External ear normal.  Left Ear: External ear normal.  Eyes: EOM are normal.  Neck: Normal range of motion.  Cardiovascular: Normal rate and regular rhythm.   Pulmonary/Chest: Effort normal and breath sounds normal. No respiratory distress. He has no wheezes. He has no rales.  Abdominal: Soft. Bowel sounds are normal. He exhibits no distension and no mass. There is tenderness. There is no rebound and no guarding.  Right  flank pain to touch and worse with twisting.   Musculoskeletal: He exhibits no edema.  Neurological: He is alert and oriented to person, place, and time.  Skin: Skin is warm and dry.   Vitals:   09/29/16 1349  BP: 110/60  Pulse: 83  Temp: 97.8 F (36.6 C)  TempSrc: Oral  SpO2: 97%  Weight: 211 lb (95.7 kg)  Height: 5\' 10"  (1.778 m)      Assessment & Plan:

## 2016-09-29 NOTE — Progress Notes (Signed)
Pre visit review using our clinic review tool, if applicable. No additional management support is needed unless otherwise documented below in the visit note. 

## 2016-10-06 ENCOUNTER — Telehealth: Payer: Self-pay

## 2016-10-06 NOTE — Telephone Encounter (Signed)
PA started.  Key: [id number]

## 2016-10-08 NOTE — Telephone Encounter (Signed)
Pt informed that PA was approved. 

## 2016-10-20 NOTE — Progress Notes (Signed)
HPI: FU CAD; history dates back to 1998 when he had a myocardial infarction. He subsequently underwent minimally invasive LIMA to his LAD. Echo May 2015 was technically difficult. Ejection fraction 40-45% and thrombus cannot be excluded. Grade 1 diastolic dysfunction. Mild to moderate left atrial enlargement. Trace mitral and tricuspid regurgitation. Echo repeated in June 2015 and showed no thrombus. Nuclear study May 2016 Showed ejection fraction 40%, prior anterior apical infarct but no ischemia. Since I last saw him, the patient has dyspnea with more extreme activities but not with routine activities. It is relieved with rest. It is not associated with chest pain. There is no orthopnea, PND or pedal edema. There is no syncope or palpitations. There is no exertional chest pain.   Current Outpatient Prescriptions  Medication Sig Dispense Refill  . aspirin 325 MG tablet Take 325 mg by mouth daily.      . metoprolol succinate (TOPROL-XL) 50 MG 24 hr tablet Take 1 tablet (50 mg total) by mouth daily. 90 tablet 2  . rosuvastatin (CRESTOR) 5 MG tablet Take 1 tablet (5 mg total) by mouth 2 (two) times a week. 30 tablet 5  . valsartan (DIOVAN) 160 MG tablet Take 1 tablet (160 mg total) by mouth daily. 90 tablet 2   No current facility-administered medications for this visit.      Past Medical History:  Diagnosis Date  . ALLERGIC RHINITIS 10/31/2007   Qualifier: Diagnosis of  By: Jenny Reichmann MD, [place]   . Allergy   . CHOLECYSTECTOMY, HX OF 06/04/2007   Qualifier: Diagnosis of  By: Danny Lawless CMA, [place]    . COLONIC POLYPS, HX OF 10/31/2007   Qualifier: Diagnosis of  By: Jenny Reichmann MD, [place], HX OF 06/04/2007   Qualifier: Diagnosis of  By: [place], [place]    . CORONARY ARTERY DISEASE 06/04/2007   Qualifier: Diagnosis of  By: [place], [place]    . Diverticulosis   . Erectile dysfunction   . ERECTILE DYSFUNCTION 06/04/2007   Qualifier: History of  By: Danny Lawless  CMA, [place]    . HYPERLIPIDEMIA 06/04/2007   Qualifier: Diagnosis of  By: Danny Lawless CMA, [place]    . Hypersomnolence   . HYPERTENSION 06/04/2007   Qualifier: Diagnosis of  By: [place], [place]    . Hypothyroidism   . ISCHEMIC CARDIOMYOPATHY 06/04/2007   Qualifier: Diagnosis of  By: Danny Lawless CMA, [place]    . Myocardial infarction   . NICM (nonischemic cardiomyopathy) ([place])   . OSA (obstructive sleep apnea)   . Osteoarthritis   . PLMD (periodic limb movement disorder)   . Sleep apnea    no cpap  . Vertigo   . Vitamin D deficiency     Past Surgical History:  Procedure Laterality Date  . CHOLECYSTECTOMY    . COLONOSCOPY    . CORONARY ARTERY BYPASS GRAFT    . PENILE PROSTHESIS IMPLANT    . REPLACEMENT TOTAL KNEE  06/12/2013  . spinal cyst    . THORACOTOMY     left anterior; wound exploration and debridement  . TONSILLECTOMY AND ADENOIDECTOMY     age 31    Social History   Social History  . Marital status: Married    Spouse name: N/A  . Number of children: 3  . Years of education: N/A   Occupational History  . 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 Smoking status: Former Smoker  Packs/day: 4.00    Years: 17.00    Types: Cigarettes    Quit date: 05/07/1981  . Smokeless tobacco: Never Used     Comment: Pt states that he would let most of them "burn" pt states that he used anywhere between 4-5PPD  . Alcohol use 0.0 oz/week     Comment: rare  . Drug use: No  . Sexual activity: Not on file   Other Topics Concern  . Not on file   Social History Narrative  . No narrative on file    Family History  Problem Relation Age of Onset  . Stomach cancer Mother   . Lung cancer Father   . Heart disease Brother     first MI at 63yo, now 100 for transplant list/ ICM  . Colon cancer Maternal Grandfather   . Esophageal cancer Neg Hx   . Pancreatic cancer Neg Hx   . Prostate cancer Neg Hx   . Rectal cancer Neg Hx     ROS: no fevers or chills,  productive cough, hemoptysis, dysphasia, odynophagia, melena, hematochezia, dysuria, hematuria, rash, seizure activity, orthopnea, PND, pedal edema, claudication. Remaining systems are negative.  Physical Exam: Well-developed well-nourished in no acute distress.  Skin is warm and dry.  HEENT is normal.  Neck is supple. No bruits Chest is clear to auscultation with normal expansion.  Cardiovascular exam is regular rate and rhythm.  Abdominal exam nontender or distended. No masses palpated. Extremities show no edema. neuro grossly intact  ECG- sinus rhythm at a rate of 59. Prior anterior infarct. Left axis deviation. personally reviewed  A/P  1 coronary artery disease-continue aspirin and statin.  2 ischemic cardiomyopathy-continue ARB and beta blocker.  3 hyperlipidemia-continue Crestor. He is only able to tolerate 5 mg twice weekly. I will ask him to be seen in the lipid clinic for consideration of repatha or lipid trial.  4 hypertension-blood pressure mildly elevated. Increase Diovan to 320 mg daily. Check potassium and renal function in one week.  Kirk Ruths, MD

## 2016-11-02 ENCOUNTER — Ambulatory Visit (INDEPENDENT_AMBULATORY_CARE_PROVIDER_SITE_OTHER): Payer: Medicare HMO | Admitting: Cardiology

## 2016-11-02 ENCOUNTER — Encounter: Payer: Self-pay | Admitting: Cardiology

## 2016-11-02 VITALS — BP 146/86 | HR 59 | Ht 70.0 in | Wt 225.8 lb

## 2016-11-02 DIAGNOSIS — E7800 Pure hypercholesterolemia, unspecified: Secondary | ICD-10-CM

## 2016-11-02 DIAGNOSIS — I10 Essential (primary) hypertension: Secondary | ICD-10-CM | POA: Diagnosis not present

## 2016-11-02 DIAGNOSIS — I2510 Atherosclerotic heart disease of native coronary artery without angina pectoris: Secondary | ICD-10-CM | POA: Diagnosis not present

## 2016-11-02 MED ORDER — VALSARTAN 320 MG PO TABS: 320.0000 mg | ORAL_TABLET | Freq: Every day | ORAL | 3 refills | Status: DC

## 2016-11-02 NOTE — Patient Instructions (Signed)
Medication Instructions:   INCREASE DIOVAN TO 320 MG ONCE DAILY= 2 OF THE 160 MG TABLETS ONCE DAILY  Labwork:  Your physician recommends that you return for lab work in: [place]:  Your physician wants you to follow-up in: [place] will receive a reminder letter in the mail two months in advance. If you don't receive a letter, please call our office to schedule the follow-up appointment.   If you need a refill on your cardiac medications before your next appointment, please call your pharmacy.   REFERRAL TO LIPID CLINIC

## 2016-11-06 ENCOUNTER — Other Ambulatory Visit: Payer: Self-pay | Admitting: Cardiology

## 2016-11-06 DIAGNOSIS — I2510 Atherosclerotic heart disease of native coronary artery without angina pectoris: Secondary | ICD-10-CM | POA: Diagnosis not present

## 2016-11-06 LAB — BASIC METABOLIC PANEL
BUN: 23 mg/dL (ref 7–25)
CALCIUM: 9.5 mg/dL (ref 8.6–10.3)
CHLORIDE: 104 mmol/L (ref 98–110)
CO2: 29 mmol/L (ref 20–31)
CREATININE: 1.02 mg/dL (ref 0.70–1.18)
Glucose, Bld: 100 mg/dL — ABNORMAL HIGH (ref 65–99)
Potassium: 4.4 mmol/L (ref 3.5–5.3)
Sodium: 141 mmol/L (ref 135–146)

## 2016-11-17 ENCOUNTER — Ambulatory Visit (INDEPENDENT_AMBULATORY_CARE_PROVIDER_SITE_OTHER): Payer: Medicare HMO | Admitting: Pharmacist

## 2016-11-17 DIAGNOSIS — E7800 Pure hypercholesterolemia, unspecified: Secondary | ICD-10-CM | POA: Diagnosis not present

## 2016-11-17 DIAGNOSIS — E785 Hyperlipidemia, unspecified: Secondary | ICD-10-CM

## 2016-11-17 NOTE — Progress Notes (Signed)
Patient ID: [name]                 DOB: 03-Oct-1945                    MRN: [id number]     HPI:  [name] [name] is a 71 y.o. male patient referred to lipid clinic by Dr. [name].   PMH includes MI in 1998, CABG, hyperlipidemia, hypertension, EF 40-45%, and grade 1 diastolic dysfunction.  Patient compliant with diet and lifestyle modifications. [name] [name] is retired and also receives care from [facility].  Presents today to Lipid clinic for initial evaluation and potential PCSK9i initiation.  Patient currently taking crestor 5mg  twice weekly, denies  ADR or problems with current therapy. He states crestor can be increased to 3 times per week (he was tolerating in the past).    Current Medications:  Crestor 5mg  twice per week  Intolerances:  Vytorin 10/40 daily - severe myalgia Crestor 20mg  daily - myalgia Crestor 5mg  daily -myalgia Zetia 10mg  daily - myalgia  Risk Factors: MI, hyperlipidemia  LDL goal: <70  Diet: low red meat, fried foods and pork chops. Eating more vegetables and home-cooked meals  Exercise: some walking but unable to increase work-out to knee pain; needs knee replacement  Social History: former smoker (stopped in 1982); denies smokeless tobvacco and alcohol inatek  Labs: CHO 203; TGs 185; HDL 41; LDL 125 (05/2016)  Past Medical History:  Diagnosis Date  . ALLERGIC RHINITIS 10/31/2007   Qualifier: Diagnosis of  By: Jenny Reichmann MD, [place] Oris   . Allergy   . CHOLECYSTECTOMY, HX OF 06/04/2007   Qualifier: Diagnosis of  By: Danny Lawless CMA, [place]    . COLONIC POLYPS, HX OF 10/31/2007   Qualifier: Diagnosis of  By: Jenny Reichmann MD, [place], HX OF 06/04/2007   Qualifier: Diagnosis of  By: [place], [place]    . CORONARY ARTERY DISEASE 06/04/2007   Qualifier: Diagnosis of  By: [place], [place]    . Diverticulosis   . Erectile dysfunction   . ERECTILE DYSFUNCTION 06/04/2007   Qualifier: History of  By: Danny Lawless CMA,  [place]    . HYPERLIPIDEMIA 06/04/2007   Qualifier: Diagnosis of  By: Danny Lawless CMA, [place]    . Hypersomnolence   . HYPERTENSION 06/04/2007   Qualifier: Diagnosis of  By: [place], [place]    . Hypothyroidism   . ISCHEMIC CARDIOMYOPATHY 06/04/2007   Qualifier: Diagnosis of  By: Danny Lawless CMA, [place]    . Myocardial infarction   . NICM (nonischemic cardiomyopathy) ([place])   . OSA (obstructive sleep apnea)   . Osteoarthritis   . PLMD (periodic limb movement disorder)   . Sleep apnea    no cpap  . Vertigo   . Vitamin D deficiency     Current Outpatient Prescriptions on File Prior to Visit  Medication Sig Dispense Refill  . aspirin 325 MG tablet Take 325 mg by mouth daily.      . metoprolol succinate (TOPROL-XL) 50 MG 24 hr tablet Take 1 tablet (50 mg total) by mouth daily. 90 tablet 2  . rosuvastatin (CRESTOR) 5 MG tablet Take 1 tablet (5 mg total) by mouth 2 (two) times a week. 30 tablet 5  . valsartan (DIOVAN) 320 MG tablet Take 1 tablet (320 mg total) by mouth daily. 90 tablet 3   No current facility-administered medications on file prior to visit.     Allergies  Allergen  Reactions  . Ace Inhibitors     REACTION: cough  . Rosuvastatin     REACTION: muscle pains  . Statins     REACTION: muscle pains    Hyperlipidemia:  LDL remains elevated above goal of <70 for secondary prevention.  Patient is willing to try PCSK9i but expressed concerns about medication cost.  Noted patient has Medicare part D plus [facility] benefits.  Will increase Crestor 5mg  to three times a week per patient's request and continue with diet modifications. Will repeat fasting LDL and lipid panel in June/2018. If LDL remains above goal, will start the paperwork to obtain Repatha.   Repatha indication, administration, storage common side effects discussed with patient. Patient assistant program also discussed at length during this appointment.  Plan to obtain the pre-authorization from his Part D insurance and try to  get Repatha through the [facility] system. If [facility] unable to provide the medication, will submit the application to [employer].   Nitika Jackowski Rodriguez-Guzman PharmD, [place] [id number] 11/17/2016 1:57 PM

## 2016-11-17 NOTE — Patient Instructions (Addendum)
Plan: 1. Increase Crestor oral 3 times per week 2. Repeat Fasting Blood work on June/4 - June/8 3. If LDL continues to be above 70; apply for Repatha   **Lipid Clinic - Pharmacist** Elleigh Cassetta/Kristin   Cholesterol Cholesterol is a fat. Your body needs a small amount of cholesterol. Cholesterol (plaque) may build up in your blood vessels (arteries). That makes you more likely to have a heart attack or stroke. You cannot feel your cholesterol level. Having a blood test is the only way to find out if your level is high. Keep your test results. Work with your doctor to keep your cholesterol at a good level. What do the results mean?  Total cholesterol is how much cholesterol is in your blood.  LDL is bad cholesterol. This is the type that can build up. Try to have low LDL.  HDL is good cholesterol. It cleans your blood vessels and carries LDL away. Try to have high HDL.  Triglycerides are fat that the body can store or burn for energy. What are good levels of cholesterol?  Total cholesterol below 200.  LDL below 100 is good for people who have health risks. LDL below 70 is good for people who have very high risks.  HDL above 40 is good. It is best to have HDL of 60 or higher.  Triglycerides below 150. How can I lower my cholesterol? Diet  Follow your diet program as told by your doctor.  Choose fish, white meat chicken, or [place] that is roasted or baked. Try not to eat red meat, fried foods, sausage, or lunch meats.  Eat lots of fresh fruits and vegetables.  Choose whole grains, beans, pasta, potatoes, and cereals.  Choose olive oil, corn oil, or canola oil. Only use small amounts.  Try not to eat butter, mayonnaise, shortening, or palm kernel oils.  Try not to eat foods with trans fats.  Choose low-fat or nonfat dairy foods.  Drink skim or nonfat milk.  Eat low-fat or nonfat yogurt and cheeses.  Try not to drink whole milk or cream.  Try not to eat ice cream, egg  yolks, or full-fat cheeses.  Healthy desserts include angel food cake, ginger snaps, animal crackers, hard candy, popsicles, and low-fat or nonfat frozen yogurt. Try not to eat pastries, cakes, pies, and cookies. Exercise  Follow your exercise program as told by your doctor.  Be more active. Try gardening, walking, and taking the stairs.  Ask your doctor about ways that you can be more active. Medicine  Take over-the-counter and prescription medicines only as told by your doctor. This information is not intended to replace advice given to you by your health care provider. Make sure you discuss any questions you have with your health care provider. Document Released: 10/23/2008 Document Revised: 02/26/2016 Document Reviewed: 02/06/2016 Elsevier Interactive Patient Education  2017 [employer].

## 2016-11-18 MED ORDER — ROSUVASTATIN CALCIUM 5 MG PO TABS: 5.0000 mg | ORAL_TABLET | ORAL | 3 refills | Status: DC

## 2017-03-05 ENCOUNTER — Telehealth: Payer: Self-pay | Admitting: Pharmacist

## 2017-03-05 DIAGNOSIS — I2510 Atherosclerotic heart disease of native coronary artery without angina pectoris: Secondary | ICD-10-CM

## 2017-03-05 NOTE — Telephone Encounter (Signed)
Talked to [name] [name]. He is tolerating crestor 5mg  3x/week (Monday/Wednesday and Friday) without problems.  Will repeat Lipid panel ASAP to assess efficacy of therapy and potential initiation of PCSK9 inhibitor.

## 2017-03-08 DIAGNOSIS — I2510 Atherosclerotic heart disease of native coronary artery without angina pectoris: Secondary | ICD-10-CM | POA: Diagnosis not present

## 2017-03-08 LAB — LIPID PANEL
CHOLESTEROL TOTAL: 180 mg/dL (ref 100–199)
Chol/HDL Ratio: 4.5 ratio (ref 0.0–5.0)
HDL: 40 mg/dL (ref >39)
LDL CALC: 109 mg/dL — AB (ref 0–99)
TRIGLYCERIDES: 153 mg/dL — AB (ref 0–149)
VLDL CHOLESTEROL CAL: 31 mg/dL (ref 5–40)

## 2017-03-08 LAB — HEPATIC FUNCTION PANEL
ALT: 13 IU/L (ref 0–44)
AST: 17 IU/L (ref 0–40)
Albumin: 4.4 g/dL (ref 3.5–4.8)
Alkaline Phosphatase: 52 IU/L (ref 39–117)
BILIRUBIN TOTAL: 0.7 mg/dL (ref 0.0–1.2)
Bilirubin, Direct: 0.18 mg/dL (ref 0.00–0.40)
Total Protein: 6.8 g/dL (ref 6.0–8.5)

## 2017-05-12 ENCOUNTER — Telehealth: Payer: Self-pay | Admitting: *Deleted

## 2017-05-12 DIAGNOSIS — I2510 Atherosclerotic heart disease of native coronary artery without angina pectoris: Secondary | ICD-10-CM

## 2017-05-12 MED ORDER — METOPROLOL SUCCINATE ER 50 MG PO TB24: 50.0000 mg | ORAL_TABLET | Freq: Every day | ORAL | 1 refills | Status: DC

## 2017-05-12 MED ORDER — LOSARTAN POTASSIUM 100 MG PO TABS: 100.0000 mg | ORAL_TABLET | Freq: Every day | ORAL | 0 refills | Status: DC

## 2017-05-12 MED ORDER — ROSUVASTATIN CALCIUM 5 MG PO TABS: 5.0000 mg | ORAL_TABLET | ORAL | 3 refills | Status: DC

## 2017-05-12 MED ORDER — LOSARTAN POTASSIUM 100 MG PO TABS: 100.0000 mg | ORAL_TABLET | Freq: Every day | ORAL | 1 refills | Status: DC

## 2017-05-12 NOTE — Telephone Encounter (Signed)
Patient has been called and made aware that he needs an appointment for surgical clearance. He has an appointment set for 10/16 with Almyra Deforest, PA.  He is currently taking Valsartan 320. Due to the recent recall, he has been switched to Losartan 100 mg tablet daily. A 10 day supply has been called into [facility], per his request, so that he has enough to get him through until he gets his [facility] supply. The patient will take his blood pressure for the next few weeks and bring the readings to his appointment. He has been instructed to call the office if his blood pressure drops too low or  increases. He verbalized his understanding.

## 2017-05-12 NOTE — Telephone Encounter (Signed)
Message left with patient to call back and set up a pre op surgical clearance appointment.

## 2017-05-12 NOTE — Telephone Encounter (Signed)
Request for surgical clearance:  1. What type of surgery is being performed? L total knee arthroplasty  2. When is this surgery scheduled? TBD  3. Are there any medications that need to be held prior to surgery and how long? ASA 325mg  DAILY  4. Name of physician performing surgery? Bertram Savin, MD  5. What is your office phone and fax number? [phone], fax [phone]

## 2017-05-12 NOTE — Telephone Encounter (Signed)
    Chart reviewed as part of pre-operative protocol coverage. Because of [name] [name]'s past medical history and time since last visit, he/she will require a follow-up visit in order to better assess preoperative cardiovascular risk.  Kerin Ransom, PA-C  05/12/2017, 4:11 PM

## 2017-05-13 ENCOUNTER — Telehealth: Payer: Self-pay | Admitting: Cardiology

## 2017-05-13 MED ORDER — VALSARTAN 320 MG PO TABS: 320.0000 mg | ORAL_TABLET | Freq: Every day | ORAL | 3 refills | Status: DC

## 2017-05-13 NOTE — Telephone Encounter (Signed)
Pt said please call him,concerning his Valsartan.

## 2017-05-13 NOTE — Telephone Encounter (Signed)
Returned the call to the patient. He was recently switched from Valsartan 320 mg to Losartan 100 mg due to the recall. He was informed by his pharmacy that his valsartan was not on recall and he would like to be switched back. The Losartan has been discontinued, [employer] has been called and made aware, and the valsartan has been refilled. Patient made aware and verbalized his understanding.

## 2017-05-17 ENCOUNTER — Encounter: Payer: Self-pay | Admitting: *Deleted

## 2017-05-20 ENCOUNTER — Encounter: Payer: Self-pay | Admitting: Physician Assistant

## 2017-05-20 ENCOUNTER — Ambulatory Visit (INDEPENDENT_AMBULATORY_CARE_PROVIDER_SITE_OTHER): Payer: Non-veteran care | Admitting: Physician Assistant

## 2017-05-20 VITALS — BP 139/91 | HR 76 | Ht 70.0 in | Wt 231.8 lb

## 2017-05-20 DIAGNOSIS — Z01810 Encounter for preprocedural cardiovascular examination: Secondary | ICD-10-CM

## 2017-05-20 DIAGNOSIS — E785 Hyperlipidemia, unspecified: Secondary | ICD-10-CM

## 2017-05-20 DIAGNOSIS — I10 Essential (primary) hypertension: Secondary | ICD-10-CM

## 2017-05-20 DIAGNOSIS — G4733 Obstructive sleep apnea (adult) (pediatric): Secondary | ICD-10-CM

## 2017-05-20 DIAGNOSIS — I25810 Atherosclerosis of coronary artery bypass graft(s) without angina pectoris: Secondary | ICD-10-CM

## 2017-05-20 MED ORDER — ROSUVASTATIN CALCIUM 5 MG PO TABS: 5.0000 mg | ORAL_TABLET | ORAL | 3 refills | Status: DC

## 2017-05-20 NOTE — Patient Instructions (Addendum)
Medication Instructions:   INCREASE Crestor 5mg  to EVERY OTHER DAY (Monday, Wednesday, Friday, Sunday 1 week, Tuesday, Thursday, Saturday alternating weeks)  Labwork:   none  Testing/Procedures:  none  Follow-Up:  With Dr. [name] in 6 months  If you need a refill on your cardiac medications before your next appointment, please call your pharmacy.

## 2017-05-20 NOTE — Progress Notes (Signed)
Cardiology Office Note    Date:  05/22/2017   ID:  Copelan, Maultsby August 26, 1945, MRN [id number]  PCP:  Biagio Borg, MD  Cardiologist: Dr. [name]   Chief Complaint  Patient presents with  . Medical Clearance    requested by Dr. Lyla Glassing for L knee surgery.    Preoperative clearance requested by Dr. Rod Can for left total knee replacement surgery  History of Present Illness:  [name] [name] is a 71 y.o. male with PMH of CAD s/p MI 1998 (minimally invasive LIMA to LAD at [facility] [place]), HTN, HLD and OSA. Echo in May 2015 was technically difficult, but showed EF 40-45%, thrombus could not be excluded, grade 1 daily. Mild to moderate left atrial enlargement. Echocardiogram repeated in June 2015 that showed no thrombus. Myoview in May 2016 showed EF 40%, prior anterior apical infarct but no ischemia. He was doing well when he last saw Dr. [name] on 11/02/2016, he has since been seen by lipid clinic on 11/17/2016, his Crestor was increased to 5 mg 3 times weekly.  Patient presents today for cardiology office visit, he has been doing well for the past year. He denies any significant chest discomfort or shortness of breath. He has upcoming surgery by Dr. Lyla Glassing. His last lipid panel shows his LDL is still not at goal, I will switch his Crestor to every other day instead. He says he cannot take the statin on a daily basis due to severe myalgia. Otherwise he has not experienced any significant exertional symptom. He has no lower extremity edema, orthopnea or PND. I have discussed his case with DOD Dr. Claiborne Billings, we felt no further workup is needed in this case. He is cleared for upcoming surgery.    Past Medical History:  Diagnosis Date  . ALLERGIC RHINITIS 10/31/2007   Qualifier: Diagnosis of  By: Jenny Reichmann MD, Hunt Oris   . Allergy   . CHOLECYSTECTOMY, HX OF 06/04/2007   Qualifier: Diagnosis of  By: Danny Lawless CMA, [place]    . COLONIC POLYPS, HX OF 10/31/2007   Qualifier: Diagnosis  of  By: Jenny Reichmann MD, [place], HX OF 06/04/2007   Qualifier: Diagnosis of  By: [place], [place]    . CORONARY ARTERY DISEASE 06/04/2007   Qualifier: Diagnosis of  By: [place], [place]    . Diverticulosis   . Erectile dysfunction   . ERECTILE DYSFUNCTION 06/04/2007   Qualifier: History of  By: Danny Lawless CMA, [place]    . HYPERLIPIDEMIA 06/04/2007   Qualifier: Diagnosis of  By: Danny Lawless CMA, [place]    . Hypersomnolence   . HYPERTENSION 06/04/2007   Qualifier: Diagnosis of  By: [place], [place]    . Hypothyroidism   . ISCHEMIC CARDIOMYOPATHY 06/04/2007   Qualifier: Diagnosis of  By: Danny Lawless CMA, [place]    . Myocardial infarction ([place])   . NICM (nonischemic cardiomyopathy) ([place])   . OSA (obstructive sleep apnea)   . Osteoarthritis   . PLMD (periodic limb movement disorder)   . Sleep apnea    no cpap  . Vertigo   . Vitamin D deficiency     Past Surgical History:  Procedure Laterality Date  . CHOLECYSTECTOMY    . COLONOSCOPY    . CORONARY ARTERY BYPASS GRAFT    . PENILE PROSTHESIS IMPLANT    . REPLACEMENT TOTAL KNEE  06/12/2013  . spinal cyst    . THORACOTOMY     left anterior; wound exploration and  debridement  . TONSILLECTOMY AND ADENOIDECTOMY     age 57    Current Medications: Outpatient Medications Prior to Visit  Medication Sig Dispense Refill  . aspirin 325 MG tablet Take 325 mg by mouth daily.      . metoprolol succinate (TOPROL-XL) 50 MG 24 hr tablet Take 1 tablet (50 mg total) by mouth daily. 90 tablet 1  . valsartan (DIOVAN) 320 MG tablet Take 1 tablet (320 mg total) by mouth daily. 90 tablet 3  . rosuvastatin (CRESTOR) 5 MG tablet Take 1 tablet (5 mg total) by mouth 3 (three) times a week. 36 tablet 3   No facility-administered medications prior to visit.      Allergies:   Ace inhibitors; Rosuvastatin; and Statins   Social History   Social History  . Marital status: Married    Spouse name: N/A  . Number of children: 3    . 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 Smoking status: Former Smoker    Packs/day: 4.00    Years: 17.00    Types: Cigarettes    Quit date: 05/07/1981  . Smokeless tobacco: Never Used     Comment: Pt states that he would let most of them "burn" pt states that he used anywhere between 4-5PPD  . Alcohol use 0.0 oz/week     Comment: rare  . Drug use: No  . Sexual activity: Not Asked   Other Topics Concern  . None   Social History Narrative  . None     Family History:  The patient's family history includes Colon cancer in his maternal grandfather; Heart disease in his brother; Lung cancer in his father; Stomach cancer in his mother.   ROS:   Please see the history of present illness.    ROS All other systems reviewed and are negative.   PHYSICAL EXAM:   VS:  BP (!) 139/91   Pulse 76   Ht 5\' 10"  (1.778 m)   Wt 231 lb 12.8 oz (105.1 kg)   BMI 33.26 kg/m    GEN: Well nourished, well developed, in no acute distress  HEENT: normal  Neck: no JVD, carotid bruits, or masses Cardiac: RRR; no murmurs, rubs, or gallops,no edema  Respiratory:  clear to auscultation bilaterally, normal work of breathing GI: soft, nontender, nondistended, + BS MS: no deformity or atrophy  Skin: warm and dry, no rash Neuro:  Alert and Oriented x 3, Strength and sensation are intact Psych: euthymic mood, full affect  Wt Readings from Last 3 Encounters:  05/20/17 231 lb 12.8 oz (105.1 kg)  11/02/16 225 lb 12.8 oz (102.4 kg)  09/29/16 211 lb (95.7 kg)      Studies/Labs Reviewed:   EKG:  EKG is ordered today.  The ekg ordered today demonstrates Normal sinus rhythm, chronic T-wave inversions in anterior leads, poor R wave progression in anterior leads.  Recent Labs: 05/28/2016: Hemoglobin 15.0; Platelets 161.0; TSH 4.55 11/06/2016: BUN 23; Creat 1.02; Potassium 4.4; Sodium 141 03/08/2017: ALT 13   Lipid Panel     Component Value Date/Time   CHOL 180 03/08/2017 1044   TRIG 153 (H) 03/08/2017 1044   TRIG 85 06/21/2006 1415   HDL 40 03/08/2017 1044   CHOLHDL 4.5 03/08/2017 1044   CHOLHDL 5 05/28/2016 1356   VLDL 37.0 05/28/2016 1356   LDLCALC 109 (H) 03/08/2017 1044   LDLDIRECT 124.7 05/08/2014 1517    Additional  studies/ records that were reviewed today include:   Echo 01/19/2014 Study Conclusions  - Impressions: Limited study Moderate LVE EF likely in the 35% range although not all views performed There was septal and apical akinesis With contrast there was no evidence of mural apical thrombus.  Impressions:  - Limited study Moderate LVE EF likely in the 35% range although not all views performed There was septal and apical akinesis With contrast there was no evidence of mural apical thrombus.   Myoview 08/26/2013 Quantitative Gated Spect Images QGS EDV:  151 ml QGS ESV:  87 ml QGS EF:  43 % QGS cine images:  Akinetic apex, mid to apical anterior wall, and mid to apical anteroseptal wall.   Overall Impression  Exercise Capacity: Lexiscan study BP Response: Normal blood pressure response. Clinical Symptoms: Lightheaded, hot ECG Impression: No significant ST segment change suggestive of ischemia. Overall Impression: Severe, fixed mid to apical anterior, mid to apical anteroseptal, and true apex defect.  Overall Impression Comments: Infarct with no ischemia.  EF 43% with akinetic apex.     ASSESSMENT:    1. Preop cardiovascular exam   2. Essential hypertension   3. Coronary artery disease involving coronary bypass graft of native heart without angina pectoris   4. Hyperlipidemia, unspecified hyperlipidemia type   5. OSA (obstructive sleep apnea)      PLAN:  In order of problems listed above:  1. Preoperative clearance: Requested by Dr. Lyla Glassing for left knee total arthroplasty. I have discussed the case with Dr. Claiborne Billings, there has been no significant  change in his symptom. He denies any exertional chest discomfort or shortness breath. He is cleared to proceed with surgery without any further workup.  2. CAD s/p minimally invasive LIMA to LAD at [facility] in [place]: Denies any significant chest discomfort or shortness of breath. No obvious exertional symptom.  3. Hypertension: Blood pressure stable.   4. Hyperlipidemia: LDL still not at goal on last lab work 03/08/2017, he cannot tolerate daily dose of statin, will increase Crestor to every other day instead.    Medication Adjustments/Labs and Tests Ordered: Current medicines are reviewed at length with the patient today.  Concerns regarding medicines are outlined above.  Medication changes, Labs and Tests ordered today are listed in the Patient Instructions below. Patient Instructions  Medication Instructions:   INCREASE Crestor 5mg  to EVERY OTHER DAY (Monday, Wednesday, Friday, Sunday 1 week, Tuesday, Thursday, Saturday alternating weeks)  Labwork:   none  Testing/Procedures:  none  Follow-Up:  With Dr. [name] in 6 months  If you need a refill on your cardiac medications before your next appointment, please call your pharmacy.      Hilbert Corrigan, [place]  05/22/2017 2:42 PM    [facility] [address], [place], [place]  [address] Phone: [phone]; Fax: [phone]

## 2017-05-22 ENCOUNTER — Encounter: Payer: Self-pay | Admitting: Physician Assistant

## 2017-05-25 ENCOUNTER — Ambulatory Visit: Payer: Medicare HMO | Admitting: Physician Assistant

## 2017-06-01 ENCOUNTER — Encounter: Payer: Commercial Managed Care - HMO | Admitting: Internal Medicine

## 2017-06-14 ENCOUNTER — Other Ambulatory Visit (INDEPENDENT_AMBULATORY_CARE_PROVIDER_SITE_OTHER): Payer: Non-veteran care

## 2017-06-14 DIAGNOSIS — Z0000 Encounter for general adult medical examination without abnormal findings: Secondary | ICD-10-CM | POA: Diagnosis not present

## 2017-06-14 LAB — BASIC METABOLIC PANEL
BUN: 15 mg/dL (ref 6–23)
CALCIUM: 9.7 mg/dL (ref 8.4–10.5)
CO2: 29 mEq/L (ref 19–32)
Chloride: 103 mEq/L (ref 96–112)
Creatinine, Ser: 0.92 mg/dL (ref 0.40–1.50)
GFR: 85.9 mL/min (ref >60.00)
Glucose, Bld: 113 mg/dL — ABNORMAL HIGH (ref 70–99)
Potassium: 4.3 mEq/L (ref 3.5–5.1)
Sodium: 139 mEq/L (ref 135–145)

## 2017-06-14 LAB — HEPATIC FUNCTION PANEL
ALK PHOS: 46 U/L (ref 39–117)
ALT: 14 U/L (ref 0–53)
AST: 15 U/L (ref 0–37)
Albumin: 4.3 g/dL (ref 3.5–5.2)
BILIRUBIN DIRECT: 0.1 mg/dL (ref 0.0–0.3)
TOTAL PROTEIN: 6.7 g/dL (ref 6.0–8.3)
Total Bilirubin: 0.7 mg/dL (ref 0.2–1.2)

## 2017-06-14 LAB — URINALYSIS, ROUTINE W REFLEX MICROSCOPIC
BILIRUBIN URINE: NEGATIVE
KETONES UR: NEGATIVE
LEUKOCYTES UA: NEGATIVE
Nitrite: NEGATIVE
Specific Gravity, Urine: 1.025 (ref 1.000–1.030)
Total Protein, Urine: NEGATIVE
UROBILINOGEN UA: 0.2 (ref 0.0–1.0)
Urine Glucose: NEGATIVE
pH: 5 (ref 5.0–8.0)

## 2017-06-14 LAB — PSA: PSA: 1.24 ng/mL (ref 0.10–4.00)

## 2017-06-14 LAB — CBC WITH DIFFERENTIAL/PLATELET
Basophils Absolute: 0.1 10*3/uL (ref 0.0–0.1)
Basophils Relative: 1.1 % (ref 0.0–3.0)
EOS ABS: 0.2 10*3/uL (ref 0.0–0.7)
Eosinophils Relative: 3.3 % (ref 0.0–5.0)
HCT: 43.7 % (ref 39.0–52.0)
HEMOGLOBIN: 14.7 g/dL (ref 13.0–17.0)
Lymphocytes Relative: 29.3 % (ref 12.0–46.0)
Lymphs Abs: 1.7 10*3/uL (ref 0.7–4.0)
MCHC: 33.5 g/dL (ref 30.0–36.0)
MCV: 95.1 fl (ref 78.0–100.0)
MONO ABS: 0.4 10*3/uL (ref 0.1–1.0)
Monocytes Relative: 7.2 % (ref 3.0–12.0)
Neutro Abs: 3.4 10*3/uL (ref 1.4–7.7)
Neutrophils Relative %: 59.1 % (ref 43.0–77.0)
PLATELETS: 139 10*3/uL — AB (ref 150.0–400.0)
RBC: 4.6 Mil/uL (ref 4.22–5.81)
RDW: 13.3 % (ref 11.5–15.5)
WBC: 5.8 10*3/uL (ref 4.0–10.5)

## 2017-06-14 LAB — TSH: TSH: 3.14 u[IU]/mL (ref 0.35–4.50)

## 2017-06-14 LAB — LIPID PANEL
CHOL/HDL RATIO: 4
Cholesterol: 168 mg/dL (ref 0–200)
HDL: 38.4 mg/dL — AB (ref >39.00)
LDL CALC: 104 mg/dL — AB (ref 0–99)
NONHDL: 129.64
TRIGLYCERIDES: 129 mg/dL (ref 0.0–149.0)
VLDL: 25.8 mg/dL (ref 0.0–40.0)

## 2017-06-18 ENCOUNTER — Encounter: Payer: Medicare HMO | Admitting: Internal Medicine

## 2017-06-22 ENCOUNTER — Ambulatory Visit (INDEPENDENT_AMBULATORY_CARE_PROVIDER_SITE_OTHER): Payer: Medicare HMO | Admitting: Internal Medicine

## 2017-06-22 ENCOUNTER — Encounter: Payer: Self-pay | Admitting: Internal Medicine

## 2017-06-22 VITALS — BP 138/82 | HR 54 | Temp 97.8°F | Ht 70.0 in | Wt 236.0 lb

## 2017-06-22 DIAGNOSIS — E785 Hyperlipidemia, unspecified: Secondary | ICD-10-CM

## 2017-06-22 DIAGNOSIS — Z0000 Encounter for general adult medical examination without abnormal findings: Secondary | ICD-10-CM | POA: Diagnosis not present

## 2017-06-22 DIAGNOSIS — Z23 Encounter for immunization: Secondary | ICD-10-CM

## 2017-06-22 NOTE — Assessment & Plan Note (Signed)
Has been statin intolerant, goal < 70 , cont lower chol diet

## 2017-06-22 NOTE — Patient Instructions (Addendum)
You had the flu shot today  Please continue all other medications as before, and refills have been done if requested.  Please have the pharmacy call with any other refills you may need.  Please continue your efforts at being more active, low cholesterol diet, and weight control.  You are otherwise up to date with prevention measures today.  Please keep your appointments with your specialists as you may have planned  Please return in 1 year for your yearly visit, or sooner if needed, with Lab testing done 3-5 days before  Good luck with your Surgury.

## 2017-06-22 NOTE — Progress Notes (Signed)
Subjective:    Patient ID: [name] [name], male    DOB: 1945-12-04, 71 y.o.   MRN: [id number]  HPI  Here for wellness and f/u;  Overall doing ok;  Pt denies Chest pain, worsening SOB, DOE, wheezing, orthopnea, PND, worsening LE edema, palpitations, dizziness or syncope.  Pt denies neurological change such as new headache, facial or extremity weakness.  Pt denies polydipsia, polyuria, or low sugar symptoms. Pt states overall good compliance with treatment and medications, good tolerability, and has been trying to follow appropriate diet.  Pt denies worsening depressive symptoms, suicidal ideation or panic. No fever, night sweats, wt loss, loss of appetite, or other constitutional symptoms.  Pt states good ability with ADL's, has low fall risk, home safety reviewed and adequate, no other significant changes in hearing or vision, and only occasionally active with exercise  Also mentions did hve severe flu like illness last fall, ok now, asks for flu shot.  Has pneumonia shot at [place] but he is not sure which one.   Due soon for left knee TKR per [place] [facility].   Had recent cardiac clearance for surgury.   Past Medical History:  Diagnosis Date  . ALLERGIC RHINITIS 10/31/2007   Qualifier: Diagnosis of  By: Jenny Reichmann MD, Hunt Oris   . Allergy   . CHOLECYSTECTOMY, HX OF 06/04/2007   Qualifier: Diagnosis of  By: Danny Lawless CMA, [place]    . COLONIC POLYPS, HX OF 10/31/2007   Qualifier: Diagnosis of  By: Jenny Reichmann MD, [place], HX OF 06/04/2007   Qualifier: Diagnosis of  By: [place], [place]    . CORONARY ARTERY DISEASE 06/04/2007   Qualifier: Diagnosis of  By: [place], [place]    . Diverticulosis   . Erectile dysfunction   . ERECTILE DYSFUNCTION 06/04/2007   Qualifier: History of  By: Danny Lawless CMA, [place]    . HYPERLIPIDEMIA 06/04/2007   Qualifier: Diagnosis of  By: Danny Lawless CMA, [place]    . Hypersomnolence   . HYPERTENSION 06/04/2007   Qualifier: Diagnosis of  By: [place],  [place]    . Hypothyroidism   . ISCHEMIC CARDIOMYOPATHY 06/04/2007   Qualifier: Diagnosis of  By: Danny Lawless CMA, [place]    . Myocardial infarction ([place])   . NICM (nonischemic cardiomyopathy) ([place])   . OSA (obstructive sleep apnea)   . Osteoarthritis   . PLMD (periodic limb movement disorder)   . Sleep apnea    no cpap  . Vertigo   . Vitamin D deficiency    Past Surgical History:  Procedure Laterality Date  . CHOLECYSTECTOMY    . COLONOSCOPY    . CORONARY ARTERY BYPASS GRAFT    . PENILE PROSTHESIS IMPLANT    . REPLACEMENT TOTAL KNEE  06/12/2013  . spinal cyst    . THORACOTOMY     left anterior; wound exploration and debridement  . TONSILLECTOMY AND ADENOIDECTOMY     age 35    reports that he quit smoking about 36 years ago. His smoking use included cigarettes. He has a 68.00 pack-year smoking history. he has never used smokeless tobacco. He reports that he drinks alcohol. He reports that he does not use drugs. family history includes Colon cancer in his maternal grandfather; Heart disease in his brother; Lung cancer in his father; Stomach cancer in his mother. Allergies  Allergen Reactions  . Ace Inhibitors     REACTION: cough  . Rosuvastatin     REACTION: muscle pains  .  Statins     REACTION: muscle pains   Current Outpatient Medications on File Prior to Visit  Medication Sig Dispense Refill  . aspirin 325 MG tablet Take 325 mg by mouth daily.      . metoprolol succinate (TOPROL-XL) 50 MG 24 hr tablet Take 1 tablet (50 mg total) by mouth daily. 90 tablet 1  . rosuvastatin (CRESTOR) 5 MG tablet Take 1 tablet (5 mg total) by mouth every other day. 40 tablet 3  . valsartan (DIOVAN) 320 MG tablet Take 1 tablet (320 mg total) by mouth daily. 90 tablet 3   No current facility-administered medications on file prior to visit.    Review of Systems  Constitutional: Negative for other unusual diaphoresis or sweats HENT: Negative for ear discharge or swelling Eyes: Negative for other  worsening visual disturbances Respiratory: Negative for stridor or other swelling  Gastrointestinal: Negative for worsening distension or other blood Genitourinary: Negative for retention or other urinary change Musculoskeletal: Negative for other MSK pain or swelling Skin: Negative for color change or other new lesions Neurological: Negative for worsening tremors and other numbness  Psychiatric/Behavioral: Negative for worsening agitation or other fatigue All other system neg per pt    Objective:   Physical Exam BP 138/82   Pulse (!) 54   Temp 97.8 F (36.6 C) (Oral)   Ht 5\' 10"  (1.778 m)   Wt 236 lb (107 kg)   SpO2 99%   BMI 33.86 kg/m  VS noted,  Constitutional: Pt is oriented to person, place, and time. Appears well-developed and well-nourished, in no significant distress and comfortable Head: Normocephalic and atraumatic  Eyes: Conjunctivae and EOM are normal. Pupils are equal, round, and reactive to light Right Ear: External ear normal without discharge Left Ear: External ear normal without discharge Nose: Nose without discharge or deformity Mouth/Throat: Oropharynx is without other ulcerations and moist  Neck: Normal range of motion. Neck supple. No JVD present. No tracheal deviation present or significant neck LA or mass Cardiovascular: Normal rate, regular rhythm, normal heart sounds and intact distal pulses.   Pulmonary/Chest: WOB normal and breath sounds without rales or wheezing  Abdominal: Soft. Bowel sounds are normal. NT. No HSM  Musculoskeletal: Normal range of motion. Exhibits trace RLE edema Lymphadenopathy: Has no other cervical adenopathy.  Neurological: Pt is alert and oriented to person, place, and time. Pt has normal reflexes. No cranial nerve deficit. Motor grossly intact, Gait intact Skin: Skin is warm and dry. No rash noted or new ulcerations Psychiatric:  Has normal mood and affect. Behavior is normal without agitation No other exam findings      Assessment & Plan:

## 2017-06-24 ENCOUNTER — Telehealth: Payer: Self-pay | Admitting: Cardiology

## 2017-06-24 NOTE — Assessment & Plan Note (Signed)

## 2017-06-24 NOTE — Telephone Encounter (Signed)
Last office note from 05/20/17 faxed to [phone] for clearance as requested.

## 2017-06-28 ENCOUNTER — Ambulatory Visit: Payer: Self-pay | Admitting: Orthopedic Surgery

## 2017-07-14 ENCOUNTER — Ambulatory Visit: Payer: Self-pay | Admitting: Orthopedic Surgery

## 2017-07-14 NOTE — H&P (View-Only) (Signed)
TOTAL KNEE ADMISSION H&P  Patient is being admitted for left total knee arthroplasty.  Subjective:  Chief Complaint:left knee pain.  HPI: [name] [name], 71 y.o. male, has a history of pain and functional disability in the left knee due to arthritis and has failed non-surgical conservative treatments for greater than 12 weeks to includeNSAID's and/or analgesics, corticosteriod injections, flexibility and strengthening excercises, use of assistive devices, weight reduction as appropriate and activity modification.  Onset of symptoms was gradual, starting 10 years ago with gradually worsening course since that time. The patient noted no past surgery on the left knee(s).  Patient currently rates pain in the left knee(s) at 10 out of 10 with activity. Patient has night pain, worsening of pain with activity and weight bearing, pain that interferes with activities of daily living, pain with passive range of motion, crepitus and joint swelling.  Patient has evidence of subchondral cysts, subchondral sclerosis, periarticular osteophytes and joint space narrowing by imaging studies. There is no active infection.  Patient Active Problem List   Diagnosis Date Noted  . Right flank pain 09/29/2016  . Influenza 09/10/2016  . Dyspnea and respiratory abnormality 09/10/2016  . Vitamin D deficiency 05/11/2014  . PLMD (periodic limb movement disorder) 02/02/2013  . Preventative health care 12/06/2011  . Joint pain 10/14/2011  . Encounter for long-term (current) use of other medications 12/25/2010  . DYSPNEA 07/30/2009  . ALLERGIC RHINITIS 10/31/2007  . COLONIC POLYPS, HX OF 10/31/2007  . Hyperlipidemia 06/04/2007  . ERECTILE DYSFUNCTION 06/04/2007  . Essential hypertension 06/04/2007  . Coronary atherosclerosis 06/04/2007  . ISCHEMIC CARDIOMYOPATHY 06/04/2007  . PENILE PROSTHESIS 06/04/2007  . CHOLECYSTECTOMY, HX OF 06/04/2007  . CORONARY ARTERY BYPASS GRAFT, HX OF 06/04/2007   Past Medical History:   Diagnosis Date  . ALLERGIC RHINITIS 10/31/2007   Qualifier: Diagnosis of  By: Jenny Reichmann MD, [place] Oris   . Allergy   . CHOLECYSTECTOMY, HX OF 06/04/2007   Qualifier: Diagnosis of  By: Danny Lawless CMA, [place]    . COLONIC POLYPS, HX OF 10/31/2007   Qualifier: Diagnosis of  By: Jenny Reichmann MD, [place], HX OF 06/04/2007   Qualifier: Diagnosis of  By: [place], [place]    . CORONARY ARTERY DISEASE 06/04/2007   Qualifier: Diagnosis of  By: [place], [place]    . Diverticulosis   . Erectile dysfunction   . ERECTILE DYSFUNCTION 06/04/2007   Qualifier: History of  By: Danny Lawless CMA, [place]    . HYPERLIPIDEMIA 06/04/2007   Qualifier: Diagnosis of  By: Danny Lawless CMA, [place]    . Hypersomnolence   . HYPERTENSION 06/04/2007   Qualifier: Diagnosis of  By: [place], [place]    . Hypothyroidism   . ISCHEMIC CARDIOMYOPATHY 06/04/2007   Qualifier: Diagnosis of  By: Danny Lawless CMA, [place]    . Myocardial infarction ([place])   . NICM (nonischemic cardiomyopathy) ([place])   . OSA (obstructive sleep apnea)   . Osteoarthritis   . PLMD (periodic limb movement disorder)   . Sleep apnea    no cpap  . Vertigo   . Vitamin D deficiency     Past Surgical History:  Procedure Laterality Date  . CHOLECYSTECTOMY    . COLONOSCOPY    . CORONARY ARTERY BYPASS GRAFT    . PENILE PROSTHESIS IMPLANT    . REPLACEMENT TOTAL KNEE  06/12/2013  . spinal cyst    . THORACOTOMY     left anterior; wound exploration and debridement  . TONSILLECTOMY AND  ADENOIDECTOMY     age 12    Current Outpatient Medications  Medication Sig Dispense Refill Last Dose  . aspirin 325 MG tablet Take 325 mg by mouth daily.     Taking  . metoprolol succinate (TOPROL-XL) 50 MG 24 hr tablet Take 1 tablet (50 mg total) by mouth daily. 90 tablet 1 Taking  . rosuvastatin (CRESTOR) 5 MG tablet Take 1 tablet (5 mg total) by mouth every other day. 40 tablet 3 Taking  . valsartan (DIOVAN) 320 MG tablet Take 1 tablet (320 mg total) by  mouth daily. 90 tablet 3 Taking   No current facility-administered medications for this visit.    Allergies  Allergen Reactions  . Ace Inhibitors     REACTION: cough  . Rosuvastatin     REACTION: muscle pains  . Statins     REACTION: muscle pains    Social History   Tobacco Use  . Smoking status: Former Smoker    Packs/day: 4.00    Years: 17.00    Pack years: 68.00    Types: Cigarettes    Last attempt to quit: 05/07/1981    Years since quitting: 36.2  . Smokeless tobacco: Never Used  . Tobacco comment: Pt states that he would let most of them "burn" pt states that he used anywhere between 4-5PPD  Substance Use Topics  . Alcohol use: Yes    Alcohol/week: 0.0 oz    Comment: rare    Family History  Problem Relation Age of Onset  . Stomach cancer Mother   . Lung cancer Father   . Heart disease Brother        first MI at 61yo, now 26 for transplant list/ ICM  . Colon cancer Maternal Grandfather   . Esophageal cancer Neg Hx   . Pancreatic cancer Neg Hx   . Prostate cancer Neg Hx   . Rectal cancer Neg Hx      Review of Systems  Constitutional: Negative.   HENT: Negative.   Eyes: Negative.   Respiratory: Negative.   Cardiovascular: Negative.   Gastrointestinal: Negative.   Genitourinary: Negative.   Musculoskeletal: Positive for joint pain.  Skin: Negative.   Neurological: Negative.   Endo/Heme/Allergies: Negative.   Psychiatric/Behavioral: Negative.     Objective:  Physical Exam  Vitals reviewed. Constitutional: He is oriented to person, place, and time. He appears well-developed and well-nourished.  HENT:  Head: Normocephalic and atraumatic.  Eyes: Conjunctivae and EOM are normal. Pupils are equal, round, and reactive to light.  Neck: Normal range of motion. Neck supple.  Cardiovascular: Normal rate, regular rhythm and intact distal pulses.  Respiratory: Effort normal. No respiratory distress.  GI: Soft. He exhibits no distension.  Genitourinary:   Genitourinary Comments: deferred  Musculoskeletal:       Left knee: He exhibits decreased range of motion, swelling, effusion, abnormal alignment and bony tenderness. Tenderness found. Medial joint line and lateral joint line tenderness noted.  Neurological: He is alert and oriented to person, place, and time. He has normal reflexes.  Skin: Skin is warm and dry.  Psychiatric: He has a normal mood and affect. His behavior is normal. Judgment and thought content normal.    Vital signs in last 24 hours: @VSRANGES @  Labs:   Estimated body mass index is 33.86 kg/m as calculated from the following:   Height as of 06/22/17: 5\' 10"  (1.778 m).   Weight as of 06/22/17: 107 kg (236 lb).   Imaging Review Plain radiographs demonstrate severe degenerative  joint disease of the left knee(s). The overall alignment issignificant varus. The bone quality appears to be adequate for age and reported activity level.  Assessment/Plan:  End stage arthritis, left knee   The patient history, physical examination, clinical judgment of the provider and imaging studies are consistent with end stage degenerative joint disease of the left knee(s) and total knee arthroplasty is deemed medically necessary. The treatment options including medical management, injection therapy arthroscopy and arthroplasty were discussed at length. The risks and benefits of total knee arthroplasty were presented and reviewed. The risks due to aseptic loosening, infection, stiffness, patella tracking problems, thromboembolic complications and other imponderables were discussed. The patient acknowledged the explanation, agreed to proceed with the plan and consent was signed. Patient is being admitted for inpatient treatment for surgery, pain control, PT, OT, prophylactic antibiotics, VTE prophylaxis, progressive ambulation and ADL's and discharge planning. The patient is planning to be discharged home with home health services. Has DME.

## 2017-07-14 NOTE — H&P (Signed)
TOTAL KNEE ADMISSION H&P  Patient is being admitted for left total knee arthroplasty.  Subjective:  Chief Complaint:left knee pain.  HPI: [name] [name], 71 y.o. male, has a history of pain and functional disability in the left knee due to arthritis and has failed non-surgical conservative treatments for greater than 12 weeks to includeNSAID's and/or analgesics, corticosteriod injections, flexibility and strengthening excercises, use of assistive devices, weight reduction as appropriate and activity modification.  Onset of symptoms was gradual, starting 10 years ago with gradually worsening course since that time. The patient noted no past surgery on the left knee(s).  Patient currently rates pain in the left knee(s) at 10 out of 10 with activity. Patient has night pain, worsening of pain with activity and weight bearing, pain that interferes with activities of daily living, pain with passive range of motion, crepitus and joint swelling.  Patient has evidence of subchondral cysts, subchondral sclerosis, periarticular osteophytes and joint space narrowing by imaging studies. There is no active infection.  Patient Active Problem List   Diagnosis Date Noted  . Right flank pain 09/29/2016  . Influenza 09/10/2016  . Dyspnea and respiratory abnormality 09/10/2016  . Vitamin D deficiency 05/11/2014  . PLMD (periodic limb movement disorder) 02/02/2013  . Preventative health care 12/06/2011  . Joint pain 10/14/2011  . Encounter for long-term (current) use of other medications 12/25/2010  . DYSPNEA 07/30/2009  . ALLERGIC RHINITIS 10/31/2007  . COLONIC POLYPS, HX OF 10/31/2007  . Hyperlipidemia 06/04/2007  . ERECTILE DYSFUNCTION 06/04/2007  . Essential hypertension 06/04/2007  . Coronary atherosclerosis 06/04/2007  . ISCHEMIC CARDIOMYOPATHY 06/04/2007  . PENILE PROSTHESIS 06/04/2007  . CHOLECYSTECTOMY, HX OF 06/04/2007  . CORONARY ARTERY BYPASS GRAFT, HX OF 06/04/2007   Past Medical History:   Diagnosis Date  . ALLERGIC RHINITIS 10/31/2007   Qualifier: Diagnosis of  By: Jenny Reichmann MD, [place]   . Allergy   . CHOLECYSTECTOMY, HX OF 06/04/2007   Qualifier: Diagnosis of  By: Danny Lawless CMA, [place]    . COLONIC POLYPS, HX OF 10/31/2007   Qualifier: Diagnosis of  By: Jenny Reichmann MD, [place], HX OF 06/04/2007   Qualifier: Diagnosis of  By: [place], [place]    . CORONARY ARTERY DISEASE 06/04/2007   Qualifier: Diagnosis of  By: [place], [place]    . Diverticulosis   . Erectile dysfunction   . ERECTILE DYSFUNCTION 06/04/2007   Qualifier: History of  By: Danny Lawless CMA, [place]    . HYPERLIPIDEMIA 06/04/2007   Qualifier: Diagnosis of  By: Danny Lawless CMA, [place]    . Hypersomnolence   . HYPERTENSION 06/04/2007   Qualifier: Diagnosis of  By: [place], [place]    . Hypothyroidism   . ISCHEMIC CARDIOMYOPATHY 06/04/2007   Qualifier: Diagnosis of  By: Danny Lawless CMA, [place]    . Myocardial infarction ([place])   . NICM (nonischemic cardiomyopathy) ([place])   . OSA (obstructive sleep apnea)   . Osteoarthritis   . PLMD (periodic limb movement disorder)   . Sleep apnea    no cpap  . Vertigo   . Vitamin D deficiency     Past Surgical History:  Procedure Laterality Date  . CHOLECYSTECTOMY    . COLONOSCOPY    . CORONARY ARTERY BYPASS GRAFT    . PENILE PROSTHESIS IMPLANT    . REPLACEMENT TOTAL KNEE  06/12/2013  . spinal cyst    . THORACOTOMY     left anterior; wound exploration and debridement  . TONSILLECTOMY AND  ADENOIDECTOMY     age 40    Current Outpatient Medications  Medication Sig Dispense Refill Last Dose  . aspirin 325 MG tablet Take 325 mg by mouth daily.     Taking  . metoprolol succinate (TOPROL-XL) 50 MG 24 hr tablet Take 1 tablet (50 mg total) by mouth daily. 90 tablet 1 Taking  . rosuvastatin (CRESTOR) 5 MG tablet Take 1 tablet (5 mg total) by mouth every other day. 40 tablet 3 Taking  . valsartan (DIOVAN) 320 MG tablet Take 1 tablet (320 mg total) by  mouth daily. 90 tablet 3 Taking   No current facility-administered medications for this visit.    Allergies  Allergen Reactions  . Ace Inhibitors     REACTION: cough  . Rosuvastatin     REACTION: muscle pains  . Statins     REACTION: muscle pains    Social History   Tobacco Use  . Smoking status: Former Smoker    Packs/day: 4.00    Years: 17.00    Pack years: 68.00    Types: Cigarettes    Last attempt to quit: 05/07/1981    Years since quitting: 36.2  . Smokeless tobacco: Never Used  . Tobacco comment: Pt states that he would let most of them "burn" pt states that he used anywhere between 4-5PPD  Substance Use Topics  . Alcohol use: Yes    Alcohol/week: 0.0 oz    Comment: rare    Family History  Problem Relation Age of Onset  . Stomach cancer Mother   . Lung cancer Father   . Heart disease Brother        first MI at 70yo, now 81 for transplant list/ ICM  . Colon cancer Maternal Grandfather   . Esophageal cancer Neg Hx   . Pancreatic cancer Neg Hx   . Prostate cancer Neg Hx   . Rectal cancer Neg Hx      Review of Systems  Constitutional: Negative.   HENT: Negative.   Eyes: Negative.   Respiratory: Negative.   Cardiovascular: Negative.   Gastrointestinal: Negative.   Genitourinary: Negative.   Musculoskeletal: Positive for joint pain.  Skin: Negative.   Neurological: Negative.   Endo/Heme/Allergies: Negative.   Psychiatric/Behavioral: Negative.     Objective:  Physical Exam  Vitals reviewed. Constitutional: He is oriented to person, place, and time. He appears well-developed and well-nourished.  HENT:  Head: Normocephalic and atraumatic.  Eyes: Conjunctivae and EOM are normal. Pupils are equal, round, and reactive to light.  Neck: Normal range of motion. Neck supple.  Cardiovascular: Normal rate, regular rhythm and intact distal pulses.  Respiratory: Effort normal. No respiratory distress.  GI: Soft. He exhibits no distension.  Genitourinary:   Genitourinary Comments: deferred  Musculoskeletal:       Left knee: He exhibits decreased range of motion, swelling, effusion, abnormal alignment and bony tenderness. Tenderness found. Medial joint line and lateral joint line tenderness noted.  Neurological: He is alert and oriented to person, place, and time. He has normal reflexes.  Skin: Skin is warm and dry.  Psychiatric: He has a normal mood and affect. His behavior is normal. Judgment and thought content normal.    Vital signs in last 24 hours: @VSRANGES @  Labs:   Estimated body mass index is 33.86 kg/m as calculated from the following:   Height as of 06/22/17: 5\' 10"  (1.778 m).   Weight as of 06/22/17: 107 kg (236 lb).   Imaging Review Plain radiographs demonstrate severe degenerative  joint disease of the left knee(s). The overall alignment issignificant varus. The bone quality appears to be adequate for age and reported activity level.  Assessment/Plan:  End stage arthritis, left knee   The patient history, physical examination, clinical judgment of the provider and imaging studies are consistent with end stage degenerative joint disease of the left knee(s) and total knee arthroplasty is deemed medically necessary. The treatment options including medical management, injection therapy arthroscopy and arthroplasty were discussed at length. The risks and benefits of total knee arthroplasty were presented and reviewed. The risks due to aseptic loosening, infection, stiffness, patella tracking problems, thromboembolic complications and other imponderables were discussed. The patient acknowledged the explanation, agreed to proceed with the plan and consent was signed. Patient is being admitted for inpatient treatment for surgery, pain control, PT, OT, prophylactic antibiotics, VTE prophylaxis, progressive ambulation and ADL's and discharge planning. The patient is planning to be discharged home with home health services. Has DME.

## 2017-07-29 ENCOUNTER — Other Ambulatory Visit (HOSPITAL_COMMUNITY): Payer: Self-pay | Admitting: Emergency Medicine

## 2017-07-29 NOTE — Patient Instructions (Signed)
[name] [name]  07/29/2017   Your procedure is scheduled on: 08-05-17  Report to [facility] Main  Entrance [place]  elevators to 3rd floor to  [place] at [place].    Call this number if you have problems the morning of surgery [phone]    Remember: ONLY 1 PERSON MAY GO WITH YOU TO SHORT STAY TO GET  READY MORNING OF YOUR SURGERY.  Do not eat food or drink liquids :After Midnight.     Take these medicines the morning of surgery with A SIP OF WATER: metoprolol, rosuvastatin                                You may not have any metal on your body including hair pins and              piercings  Do not wear jewelry, make-up, lotions, powders or perfumes, deodorant                Men may shave face and neck.   Do not bring valuables to the hospital. [place].  Contacts, dentures or bridgework may not be worn into surgery.  Leave suitcase in the car. After surgery it may be brought to your room.               Please read over the following fact sheets you were given: _____________________________________________________________________             [facility] - Preparing for Surgery Before surgery, you can play an important role.  Because skin is not sterile, your skin needs to be as free of germs as possible.  You can reduce the number of germs on your skin by washing with CHG (chlorahexidine gluconate) soap before surgery.  CHG is an antiseptic cleaner which kills germs and bonds with the skin to continue killing germs even after washing. Please DO NOT use if you have an allergy to CHG or antibacterial soaps.  If your skin becomes reddened/irritated stop using the CHG and inform your nurse when you arrive at Short Stay. Do not shave (including legs and underarms) for at least 48 hours prior to the first CHG shower.  You may shave your face/neck. Please follow these instructions carefully:  1.   Shower with CHG Soap the night before surgery and the  morning of Surgery.  2.  If you choose to wash your hair, wash your hair first as usual with your  normal  shampoo.  3.  After you shampoo, rinse your hair and body thoroughly to remove the  shampoo.                           4.  Use CHG as you would any other liquid soap.  You can apply chg directly  to the skin and wash                       Gently with a scrungie or clean washcloth.  5.  Apply the CHG Soap to your body ONLY FROM THE NECK DOWN.   Do not use on face/ open  Wound or open sores. Avoid contact with eyes, ears mouth and genitals (private parts).                       Wash face,  Genitals (private parts) with your normal soap.             6.  Wash thoroughly, paying special attention to the area where your surgery  will be performed.  7.  Thoroughly rinse your body with warm water from the neck down.  8.  DO NOT shower/wash with your normal soap after using and rinsing off  the CHG Soap.                9.  Pat yourself dry with a clean towel.            10.  Wear clean pajamas.            11.  Place clean sheets on your bed the night of your first shower and do not  sleep with pets. Day of Surgery : Do not apply any lotions/deodorants the morning of surgery.  Please wear clean clothes to the hospital/surgery center.  FAILURE TO FOLLOW THESE INSTRUCTIONS MAY RESULT IN THE CANCELLATION OF YOUR SURGERY PATIENT SIGNATURE_________________________________  NURSE SIGNATURE__________________________________  ________________________________________________________________________   Adam Phenix  An incentive spirometer is a tool that can help keep your lungs clear and active. This tool measures how well you are filling your lungs with each breath. Taking long deep breaths may help reverse or decrease the chance of developing breathing (pulmonary) problems (especially infection) following:  A long  period of time when you are unable to move or be active. BEFORE THE PROCEDURE   If the spirometer includes an indicator to show your best effort, your nurse or respiratory therapist will set it to a desired goal.  If possible, sit up straight or lean slightly forward. Try not to slouch.  Hold the incentive spirometer in an upright position. INSTRUCTIONS FOR USE  1. Sit on the edge of your bed if possible, or sit up as far as you can in bed or on a chair. 2. Hold the incentive spirometer in an upright position. 3. Breathe out normally. 4. Place the mouthpiece in your mouth and seal your lips tightly around it. 5. Breathe in slowly and as deeply as possible, raising the piston or the ball toward the top of the column. 6. Hold your breath for 3-5 seconds or for as long as possible. Allow the piston or ball to fall to the bottom of the column. 7. Remove the mouthpiece from your mouth and breathe out normally. 8. Rest for a few seconds and repeat Steps 1 through 7 at least 10 times every 1-2 hours when you are awake. Take your time and take a few normal breaths between deep breaths. 9. The spirometer may include an indicator to show your best effort. Use the indicator as a goal to work toward during each repetition. 10. After each set of 10 deep breaths, practice coughing to be sure your lungs are clear. If you have an incision (the cut made at the time of surgery), support your incision when coughing by placing a pillow or rolled up towels firmly against it. Once you are able to get out of bed, walk around indoors and cough well. You may stop using the incentive spirometer when instructed by your caregiver.  RISKS AND COMPLICATIONS  Take your time so you do not get  dizzy or light-headed.  If you are in pain, you may need to take or ask for pain medication before doing incentive spirometry. It is harder to take a deep breath if you are having pain. AFTER USE  Rest and breathe slowly and  easily.  It can be helpful to keep track of a log of your progress. Your caregiver can provide you with a simple table to help with this. If you are using the spirometer at home, follow these instructions: [place] IF:   You are having difficultly using the spirometer.  You have trouble using the spirometer as often as instructed.  Your pain medication is not giving enough relief while using the spirometer.  You develop fever of 100.5 F (38.1 C) or higher. SEEK IMMEDIATE MEDICAL CARE IF:   You cough up bloody sputum that had not been present before.  You develop fever of 102 F (38.9 C) or greater.  You develop worsening pain at or near the incision site. MAKE SURE YOU:   Understand these instructions.  Will watch your condition.  Will get help right away if you are not doing well or get worse. Document Released: 12/07/2006 Document Revised: 10/19/2011 Document Reviewed: 02/07/2007 ExitCare Patient Information 2014 ExitCare, [place].   ________________________________________________________________________  WHAT IS A BLOOD TRANSFUSION? Blood Transfusion Information  A transfusion is the replacement of blood or some of its parts. Blood is made up of multiple cells which provide different functions.  Red blood cells carry oxygen and are used for blood loss replacement.  White blood cells fight against infection.  Platelets control bleeding.  Plasma helps clot blood.  Other blood products are available for specialized needs, such as hemophilia or other clotting disorders. BEFORE THE TRANSFUSION  Who gives blood for transfusions?   Healthy volunteers who are fully evaluated to make sure their blood is safe. This is blood bank blood. Transfusion therapy is the safest it has ever been in the practice of medicine. Before blood is taken from a donor, a complete history is taken to make sure that person has no history of diseases nor engages in risky social  behavior (examples are intravenous drug use or sexual activity with multiple partners). The donor's travel history is screened to minimize risk of transmitting infections, such as malaria. The donated blood is tested for signs of infectious diseases, such as HIV and hepatitis. The blood is then tested to be sure it is compatible with you in order to minimize the chance of a transfusion reaction. If you or a relative donates blood, this is often done in anticipation of surgery and is not appropriate for emergency situations. It takes many days to process the donated blood. RISKS AND COMPLICATIONS Although transfusion therapy is very safe and saves many lives, the main dangers of transfusion include:   Getting an infectious disease.  Developing a transfusion reaction. This is an allergic reaction to something in the blood you were given. Every precaution is taken to prevent this. The decision to have a blood transfusion has been considered carefully by your caregiver before blood is given. Blood is not given unless the benefits outweigh the risks. AFTER THE TRANSFUSION  Right after receiving a blood transfusion, you will usually feel much better and more energetic. This is especially true if your red blood cells have gotten low (anemic). The transfusion raises the level of the red blood cells which carry oxygen, and this usually causes an energy increase.  The nurse administering the transfusion will  monitor you carefully for complications. HOME CARE INSTRUCTIONS  No special instructions are needed after a transfusion. You may find your energy is better. Speak with your caregiver about any limitations on activity for underlying diseases you may have. SEEK MEDICAL CARE IF:   Your condition is not improving after your transfusion.  You develop redness or irritation at the intravenous (IV) site. SEEK IMMEDIATE MEDICAL CARE IF:  Any of the following symptoms occur over the next 12 hours:  Shaking  chills.  You have a temperature by mouth above 102 F (38.9 C), not controlled by medicine.  Chest, back, or muscle pain.  People around you feel you are not acting correctly or are confused.  Shortness of breath or difficulty breathing.  Dizziness and fainting.  You get a rash or develop hives.  You have a decrease in urine output.  Your urine turns a dark color or changes to pink, red, or brown. Any of the following symptoms occur over the next 10 days:  You have a temperature by mouth above 102 F (38.9 C), not controlled by medicine.  Shortness of breath.  Weakness after normal activity.  The white part of the eye turns yellow (jaundice).  You have a decrease in the amount of urine or are urinating less often.  Your urine turns a dark color or changes to pink, red, or brown. Document Released: 07/24/2000 Document Revised: 10/19/2011 Document Reviewed: 03/12/2008 [facility] Patient Information 2014 [place], [place].  _______________________________________________________________________

## 2017-07-29 NOTE — Progress Notes (Signed)
LOV/Cardiac clearance Almyra Deforest PA 05-20-17 epic  LOV/Medical clearance Darcella Cheshire FNP 06-22-17 on chart from [place]  EKG 05-20-17 epic  LABS: cbcdiff, bmp, ptinr, ptt on chart from [place]  cxr 04-19-17 on chart from [place] test 12-13-14 epic

## 2017-07-30 ENCOUNTER — Other Ambulatory Visit: Payer: Self-pay

## 2017-07-30 ENCOUNTER — Encounter (INDEPENDENT_AMBULATORY_CARE_PROVIDER_SITE_OTHER): Payer: Self-pay

## 2017-07-30 ENCOUNTER — Encounter (HOSPITAL_COMMUNITY): Payer: Self-pay

## 2017-07-30 ENCOUNTER — Encounter (HOSPITAL_COMMUNITY)
Admission: RE | Admit: 2017-07-30 | Discharge: 2017-07-30 | Disposition: A | Discharge status: Home / Self Care | Payer: Non-veteran care | Source: Ambulatory Visit | Attending: Orthopedic Surgery | Admitting: Orthopedic Surgery

## 2017-07-30 DIAGNOSIS — M1712 Unilateral primary osteoarthritis, left knee: Secondary | ICD-10-CM | POA: Insufficient documentation

## 2017-07-30 DIAGNOSIS — Z01812 Encounter for preprocedural laboratory examination: Secondary | ICD-10-CM | POA: Diagnosis present

## 2017-07-30 DIAGNOSIS — Z0183 Encounter for blood typing: Secondary | ICD-10-CM | POA: Diagnosis not present

## 2017-07-30 HISTORY — DX: Blepharospasm: G24.5

## 2017-07-30 LAB — BASIC METABOLIC PANEL
Anion gap: 8 (ref 5–15)
BUN: 22 mg/dL — AB (ref 6–20)
CHLORIDE: 102 mmol/L (ref 101–111)
CO2: 28 mmol/L (ref 22–32)
Calcium: 9.3 mg/dL (ref 8.9–10.3)
Creatinine, Ser: 1.08 mg/dL (ref 0.61–1.24)
GFR calc Af Amer: >60 mL/min (ref >60)
GFR calc non Af Amer: >60 mL/min (ref >60)
GLUCOSE: 101 mg/dL — AB (ref 65–99)
POTASSIUM: 4.6 mmol/L (ref 3.5–5.1)
Sodium: 138 mmol/L (ref 135–145)

## 2017-07-30 LAB — CBC
HEMATOCRIT: 43.1 % (ref 39.0–52.0)
Hemoglobin: 14.7 g/dL (ref 13.0–17.0)
MCH: 32 pg (ref 26.0–34.0)
MCHC: 34.1 g/dL (ref 30.0–36.0)
MCV: 93.7 fL (ref 78.0–100.0)
Platelets: 142 10*3/uL — ABNORMAL LOW (ref 150–400)
RBC: 4.6 MIL/uL (ref 4.22–5.81)
RDW: 12.8 % (ref 11.5–15.5)
WBC: 6.3 10*3/uL (ref 4.0–10.5)

## 2017-07-30 LAB — SURGICAL PCR SCREEN
MRSA, PCR: NEGATIVE
STAPHYLOCOCCUS AUREUS: POSITIVE — AB

## 2017-07-31 LAB — ABO/RH: ABO/RH(D): O POS

## 2017-08-05 ENCOUNTER — Ambulatory Visit (HOSPITAL_COMMUNITY)
Admission: RE | Admit: 2017-08-05 | Discharge: 2017-08-06 | Disposition: A | Discharge status: Home / Self Care | Payer: Non-veteran care | Source: Ambulatory Visit | Attending: Orthopedic Surgery | Admitting: Orthopedic Surgery

## 2017-08-05 ENCOUNTER — Inpatient Hospital Stay (HOSPITAL_COMMUNITY): Payer: Non-veteran care | Admitting: Certified Registered Nurse Anesthetist

## 2017-08-05 ENCOUNTER — Encounter (HOSPITAL_COMMUNITY)
Admission: RE | Disposition: A | Discharge status: Home / Self Care | Payer: Self-pay | Source: Ambulatory Visit | Attending: Orthopedic Surgery

## 2017-08-05 ENCOUNTER — Inpatient Hospital Stay (HOSPITAL_COMMUNITY): Payer: Non-veteran care

## 2017-08-05 ENCOUNTER — Encounter (HOSPITAL_COMMUNITY): Payer: Self-pay | Admitting: *Deleted

## 2017-08-05 ENCOUNTER — Other Ambulatory Visit: Payer: Self-pay

## 2017-08-05 DIAGNOSIS — G4730 Sleep apnea, unspecified: Secondary | ICD-10-CM | POA: Diagnosis not present

## 2017-08-05 DIAGNOSIS — M1990 Unspecified osteoarthritis, unspecified site: Secondary | ICD-10-CM | POA: Diagnosis not present

## 2017-08-05 DIAGNOSIS — Z471 Aftercare following joint replacement surgery: Secondary | ICD-10-CM | POA: Diagnosis not present

## 2017-08-05 DIAGNOSIS — N529 Male erectile dysfunction, unspecified: Secondary | ICD-10-CM | POA: Diagnosis not present

## 2017-08-05 DIAGNOSIS — E559 Vitamin D deficiency, unspecified: Secondary | ICD-10-CM | POA: Diagnosis not present

## 2017-08-05 DIAGNOSIS — Z888 Allergy status to other drugs, medicaments and biological substances status: Secondary | ICD-10-CM | POA: Insufficient documentation

## 2017-08-05 DIAGNOSIS — Z79899 Other long term (current) drug therapy: Secondary | ICD-10-CM | POA: Insufficient documentation

## 2017-08-05 DIAGNOSIS — G4733 Obstructive sleep apnea (adult) (pediatric): Secondary | ICD-10-CM | POA: Insufficient documentation

## 2017-08-05 DIAGNOSIS — Z8249 Family history of ischemic heart disease and other diseases of the circulatory system: Secondary | ICD-10-CM | POA: Diagnosis not present

## 2017-08-05 DIAGNOSIS — I10 Essential (primary) hypertension: Secondary | ICD-10-CM | POA: Insufficient documentation

## 2017-08-05 DIAGNOSIS — Z7982 Long term (current) use of aspirin: Secondary | ICD-10-CM | POA: Insufficient documentation

## 2017-08-05 DIAGNOSIS — I2510 Atherosclerotic heart disease of native coronary artery without angina pectoris: Secondary | ICD-10-CM | POA: Insufficient documentation

## 2017-08-05 DIAGNOSIS — Z96652 Presence of left artificial knee joint: Secondary | ICD-10-CM | POA: Diagnosis not present

## 2017-08-05 DIAGNOSIS — Z87891 Personal history of nicotine dependence: Secondary | ICD-10-CM | POA: Insufficient documentation

## 2017-08-05 DIAGNOSIS — E785 Hyperlipidemia, unspecified: Secondary | ICD-10-CM | POA: Insufficient documentation

## 2017-08-05 DIAGNOSIS — G4761 Periodic limb movement disorder: Secondary | ICD-10-CM | POA: Insufficient documentation

## 2017-08-05 DIAGNOSIS — I252 Old myocardial infarction: Secondary | ICD-10-CM | POA: Insufficient documentation

## 2017-08-05 DIAGNOSIS — Z951 Presence of aortocoronary bypass graft: Secondary | ICD-10-CM | POA: Diagnosis not present

## 2017-08-05 DIAGNOSIS — E039 Hypothyroidism, unspecified: Secondary | ICD-10-CM | POA: Diagnosis not present

## 2017-08-05 DIAGNOSIS — I255 Ischemic cardiomyopathy: Secondary | ICD-10-CM | POA: Diagnosis not present

## 2017-08-05 DIAGNOSIS — M1712 Unilateral primary osteoarthritis, left knee: Secondary | ICD-10-CM | POA: Diagnosis not present

## 2017-08-05 HISTORY — PX: KNEE ARTHROPLASTY: SHX992

## 2017-08-05 LAB — TYPE AND SCREEN
ABO/RH(D): O POS
Antibody Screen: NEGATIVE

## 2017-08-05 SURGERY — ARTHROPLASTY, KNEE, TOTAL, USING IMAGELESS COMPUTER-ASSISTED NAVIGATION
Anesthesia: Spinal | Site: Knee | Laterality: Left

## 2017-08-05 MED ORDER — METOCLOPRAMIDE HCL 5 MG/ML IJ SOLN: 10.0000 mg | Freq: Once | INTRAMUSCULAR | Status: DC | PRN

## 2017-08-05 MED ORDER — POVIDONE-IODINE 10 % EX SWAB
2.0000 "application " | Freq: Once | CUTANEOUS | Status: AC
  Administered 2017-08-05: 2 via TOPICAL

## 2017-08-05 MED ORDER — BUPIVACAINE HCL (PF) 0.5 % IJ SOLN
INTRAMUSCULAR | Status: DC | PRN
  Administered 2017-08-05: 3 mL

## 2017-08-05 MED ORDER — MIDAZOLAM HCL 2 MG/2ML IJ SOLN
INTRAMUSCULAR | Status: AC
  Filled 2017-08-05: qty 2

## 2017-08-05 MED ORDER — ACETAMINOPHEN 325 MG PO TABS: 650.0000 mg | ORAL_TABLET | ORAL | Status: DC | PRN

## 2017-08-05 MED ORDER — CEFAZOLIN SODIUM-DEXTROSE 2-4 GM/100ML-% IV SOLN
2.0000 g | Freq: Four times a day (QID) | INTRAVENOUS | Status: AC
  Administered 2017-08-05 – 2017-08-06 (×2): 2 g via INTRAVENOUS
  Filled 2017-08-05 (×2): qty 100

## 2017-08-05 MED ORDER — IRBESARTAN 150 MG PO TABS: 300.0000 mg | ORAL_TABLET | Freq: Every day | ORAL | Status: DC

## 2017-08-05 MED ORDER — CHLORHEXIDINE GLUCONATE 4 % EX LIQD: 60.0000 mL | Freq: Once | CUTANEOUS | Status: DC

## 2017-08-05 MED ORDER — DEXAMETHASONE SODIUM PHOSPHATE 10 MG/ML IJ SOLN
INTRAMUSCULAR | Status: DC | PRN
  Administered 2017-08-05: 10 mg via INTRAVENOUS

## 2017-08-05 MED ORDER — DEXAMETHASONE SODIUM PHOSPHATE 10 MG/ML IJ SOLN
INTRAMUSCULAR | Status: AC
  Filled 2017-08-05: qty 1

## 2017-08-05 MED ORDER — PHENYLEPHRINE 40 MCG/ML (10ML) SYRINGE FOR IV PUSH (FOR BLOOD PRESSURE SUPPORT)
PREFILLED_SYRINGE | INTRAVENOUS | Status: AC
  Filled 2017-08-05: qty 10

## 2017-08-05 MED ORDER — STERILE WATER FOR IRRIGATION IR SOLN
Status: DC | PRN
  Administered 2017-08-05: 2000 mL

## 2017-08-05 MED ORDER — HYDROMORPHONE HCL 1 MG/ML IJ SOLN: 0.5000 mg | INTRAMUSCULAR | Status: DC | PRN

## 2017-08-05 MED ORDER — METOPROLOL SUCCINATE ER 50 MG PO TB24
50.0000 mg | ORAL_TABLET | Freq: Every day | ORAL | Status: DC
  Filled 2017-08-05: qty 1

## 2017-08-05 MED ORDER — FENTANYL CITRATE (PF) 100 MCG/2ML IJ SOLN
INTRAMUSCULAR | Status: AC
  Filled 2017-08-05: qty 2

## 2017-08-05 MED ORDER — KETOROLAC TROMETHAMINE 15 MG/ML IJ SOLN
15.0000 mg | Freq: Four times a day (QID) | INTRAMUSCULAR | Status: AC
  Administered 2017-08-05 – 2017-08-06 (×4): 15 mg via INTRAVENOUS
  Filled 2017-08-05 (×4): qty 1

## 2017-08-05 MED ORDER — TRANEXAMIC ACID 1000 MG/10ML IV SOLN
1000.0000 mg | INTRAVENOUS | Status: AC
  Administered 2017-08-05: 1000 mg via INTRAVENOUS
  Filled 2017-08-05: qty 1100

## 2017-08-05 MED ORDER — PHENYLEPHRINE HCL 10 MG/ML IJ SOLN
INTRAMUSCULAR | Status: DC | PRN
  Administered 2017-08-05: 40 ug via INTRAVENOUS
  Administered 2017-08-05 (×2): 80 ug via INTRAVENOUS
  Administered 2017-08-05 (×2): 40 ug via INTRAVENOUS

## 2017-08-05 MED ORDER — ISOPROPYL ALCOHOL 70 % SOLN
Status: DC | PRN
Start: 2017-08-05 — End: 2017-08-05
  Administered 2017-08-05: 1 via TOPICAL

## 2017-08-05 MED ORDER — ALBUMIN HUMAN 5 % IV SOLN
INTRAVENOUS | Status: DC | PRN
  Administered 2017-08-05: 13:00:00 via INTRAVENOUS

## 2017-08-05 MED ORDER — VITAMIN D3 25 MCG (1000 UNIT) PO TABS
1000.0000 [IU] | ORAL_TABLET | Freq: Every day | ORAL | Status: DC
  Administered 2017-08-06: 11:00:00 1000 [IU] via ORAL
  Filled 2017-08-05: qty 1

## 2017-08-05 MED ORDER — FENTANYL CITRATE (PF) 100 MCG/2ML IJ SOLN: 25.0000 ug | INTRAMUSCULAR | Status: DC | PRN

## 2017-08-05 MED ORDER — METHOCARBAMOL 500 MG PO TABS
500.0000 mg | ORAL_TABLET | Freq: Four times a day (QID) | ORAL | Status: DC | PRN
  Administered 2017-08-06: 500 mg via ORAL
  Filled 2017-08-05: qty 1

## 2017-08-05 MED ORDER — KETOROLAC TROMETHAMINE 30 MG/ML IJ SOLN
INTRAMUSCULAR | Status: AC
  Filled 2017-08-05: qty 1

## 2017-08-05 MED ORDER — SODIUM CHLORIDE 0.9 % IR SOLN
Status: DC | PRN
  Administered 2017-08-05: 3000 mL

## 2017-08-05 MED ORDER — ONDANSETRON HCL 4 MG PO TABS: 4.0000 mg | ORAL_TABLET | Freq: Four times a day (QID) | ORAL | Status: DC | PRN

## 2017-08-05 MED ORDER — METHOCARBAMOL 1000 MG/10ML IJ SOLN
500.0000 mg | Freq: Four times a day (QID) | INTRAMUSCULAR | Status: DC | PRN
  Filled 2017-08-05: qty 5

## 2017-08-05 MED ORDER — MEPERIDINE HCL 50 MG/ML IJ SOLN: 6.2500 mg | INTRAMUSCULAR | Status: DC | PRN

## 2017-08-05 MED ORDER — LACTATED RINGERS IV SOLN
INTRAVENOUS | Status: DC
Start: 2017-08-05 — End: 2017-08-05
  Administered 2017-08-05 (×3): via INTRAVENOUS

## 2017-08-05 MED ORDER — ONDANSETRON HCL 4 MG/2ML IJ SOLN
INTRAMUSCULAR | Status: DC | PRN
  Administered 2017-08-05: 4 mg via INTRAVENOUS

## 2017-08-05 MED ORDER — DEXAMETHASONE SODIUM PHOSPHATE 10 MG/ML IJ SOLN
10.0000 mg | Freq: Once | INTRAMUSCULAR | Status: AC
  Administered 2017-08-06: 10 mg via INTRAVENOUS
  Filled 2017-08-05: qty 1

## 2017-08-05 MED ORDER — ACETAMINOPHEN 10 MG/ML IV SOLN
1000.0000 mg | INTRAVENOUS | Status: AC
  Administered 2017-08-05: 1000 mg via INTRAVENOUS
  Filled 2017-08-05: qty 100

## 2017-08-05 MED ORDER — PROPOFOL 10 MG/ML IV BOLUS
INTRAVENOUS | Status: AC
  Filled 2017-08-05: qty 40

## 2017-08-05 MED ORDER — SODIUM CHLORIDE 0.9 % IV SOLN: INTRAVENOUS | Status: DC

## 2017-08-05 MED ORDER — ONDANSETRON HCL 4 MG/2ML IJ SOLN: 4.0000 mg | Freq: Four times a day (QID) | INTRAMUSCULAR | Status: DC | PRN

## 2017-08-05 MED ORDER — CEFAZOLIN SODIUM-DEXTROSE 2-4 GM/100ML-% IV SOLN
2.0000 g | INTRAVENOUS | Status: AC
  Administered 2017-08-05: 2 g via INTRAVENOUS
  Filled 2017-08-05: qty 100

## 2017-08-05 MED ORDER — ISOPROPYL ALCOHOL 70 % SOLN
Status: AC
  Filled 2017-08-05: qty 480

## 2017-08-05 MED ORDER — ROSUVASTATIN CALCIUM 5 MG PO TABS
5.0000 mg | ORAL_TABLET | ORAL | Status: DC
  Administered 2017-08-05: 5 mg via ORAL
  Filled 2017-08-05: qty 1

## 2017-08-05 MED ORDER — METOPROLOL SUCCINATE ER 50 MG PO TB24
50.0000 mg | ORAL_TABLET | Freq: Every day | ORAL | Status: DC
  Administered 2017-08-05: 50 mg via ORAL
  Filled 2017-08-05: qty 1

## 2017-08-05 MED ORDER — BUPIVACAINE-EPINEPHRINE 0.25% -1:200000 IJ SOLN: INTRAMUSCULAR | Status: DC | PRN

## 2017-08-05 MED ORDER — PROPOFOL 500 MG/50ML IV EMUL
INTRAVENOUS | Status: DC | PRN
  Administered 2017-08-05: 50 ug/kg/min via INTRAVENOUS

## 2017-08-05 MED ORDER — DIPHENHYDRAMINE HCL 12.5 MG/5ML PO ELIX: 12.5000 mg | ORAL_SOLUTION | ORAL | Status: DC | PRN

## 2017-08-05 MED ORDER — FENTANYL CITRATE (PF) 100 MCG/2ML IJ SOLN
50.0000 ug | INTRAMUSCULAR | Status: DC | PRN
  Administered 2017-08-05: 50 ug via INTRAVENOUS

## 2017-08-05 MED ORDER — MIDAZOLAM HCL 2 MG/2ML IJ SOLN
1.0000 mg | INTRAMUSCULAR | Status: DC | PRN
Start: 2017-08-05 — End: 2017-08-05
  Administered 2017-08-05: 1 mg via INTRAVENOUS

## 2017-08-05 MED ORDER — PHENYLEPHRINE HCL 10 MG/ML IJ SOLN
INTRAMUSCULAR | Status: DC | PRN
  Administered 2017-08-05: 40 ug/min via INTRAVENOUS

## 2017-08-05 MED ORDER — PHENOL 1.4 % MT LIQD: 1.0000 | OROMUCOSAL | Status: DC | PRN

## 2017-08-05 MED ORDER — SODIUM CHLORIDE 0.9 % IV SOLN
INTRAVENOUS | Status: DC
  Administered 2017-08-05 – 2017-08-06 (×2): via INTRAVENOUS

## 2017-08-05 MED ORDER — DOCUSATE SODIUM 100 MG PO CAPS
100.0000 mg | ORAL_CAPSULE | Freq: Two times a day (BID) | ORAL | Status: DC
  Administered 2017-08-06: 100 mg via ORAL
  Filled 2017-08-05: qty 1

## 2017-08-05 MED ORDER — ALUM & MAG HYDROXIDE-SIMETH 200-200-20 MG/5ML PO SUSP: 30.0000 mL | ORAL | Status: DC | PRN

## 2017-08-05 MED ORDER — POLYETHYLENE GLYCOL 3350 17 G PO PACK: 17.0000 g | PACK | Freq: Every day | ORAL | Status: DC | PRN

## 2017-08-05 MED ORDER — HYDROCODONE-ACETAMINOPHEN 5-325 MG PO TABS: 1.0000 | ORAL_TABLET | ORAL | Status: DC | PRN

## 2017-08-05 MED ORDER — MENTHOL 3 MG MT LOZG: 1.0000 | LOZENGE | OROMUCOSAL | Status: DC | PRN

## 2017-08-05 MED ORDER — MIDAZOLAM HCL 5 MG/5ML IJ SOLN
INTRAMUSCULAR | Status: DC | PRN
  Administered 2017-08-05 (×2): 1 mg via INTRAVENOUS

## 2017-08-05 MED ORDER — BUPIVACAINE HCL (PF) 0.25 % IJ SOLN
INTRAMUSCULAR | Status: AC
  Filled 2017-08-05: qty 30

## 2017-08-05 MED ORDER — ACETAMINOPHEN 650 MG RE SUPP: 650.0000 mg | RECTAL | Status: DC | PRN

## 2017-08-05 MED ORDER — KETOROLAC TROMETHAMINE 30 MG/ML IJ SOLN
INTRAMUSCULAR | Status: DC | PRN
  Administered 2017-08-05: 30 mg

## 2017-08-05 MED ORDER — ASPIRIN 81 MG PO CHEW
81.0000 mg | CHEWABLE_TABLET | Freq: Two times a day (BID) | ORAL | Status: DC
  Administered 2017-08-05 – 2017-08-06 (×2): 81 mg via ORAL
  Filled 2017-08-05 (×2): qty 1

## 2017-08-05 MED ORDER — SODIUM CHLORIDE 0.9 % IJ SOLN
INTRAMUSCULAR | Status: DC | PRN
  Administered 2017-08-05: 30 mL

## 2017-08-05 MED ORDER — SODIUM CHLORIDE 0.9 % IJ SOLN
INTRAMUSCULAR | Status: AC
  Filled 2017-08-05: qty 50

## 2017-08-05 MED ORDER — FENTANYL CITRATE (PF) 100 MCG/2ML IJ SOLN
INTRAMUSCULAR | Status: DC | PRN
  Administered 2017-08-05 (×2): 50 ug via INTRAVENOUS

## 2017-08-05 MED ORDER — SODIUM CHLORIDE 0.9 % IR SOLN
Status: DC | PRN
Start: 2017-08-05 — End: 2017-08-05
  Administered 2017-08-05: 1000 mL

## 2017-08-05 MED ORDER — METOCLOPRAMIDE HCL 5 MG PO TABS: 5.0000 mg | ORAL_TABLET | Freq: Three times a day (TID) | ORAL | Status: DC | PRN

## 2017-08-05 MED ORDER — SENNA 8.6 MG PO TABS
2.0000 | ORAL_TABLET | Freq: Every day | ORAL | Status: DC
  Filled 2017-08-05 (×2): qty 2

## 2017-08-05 MED ORDER — ROPIVACAINE HCL 7.5 MG/ML IJ SOLN
INTRAMUSCULAR | Status: DC | PRN
  Administered 2017-08-05: 20 mL via PERINEURAL

## 2017-08-05 MED ORDER — ONDANSETRON HCL 4 MG/2ML IJ SOLN
INTRAMUSCULAR | Status: AC
  Filled 2017-08-05: qty 2

## 2017-08-05 MED ORDER — TRANEXAMIC ACID 1000 MG/10ML IV SOLN
1000.0000 mg | Freq: Once | INTRAVENOUS | Status: AC
  Administered 2017-08-05: 1000 mg via INTRAVENOUS
  Filled 2017-08-05: qty 1100

## 2017-08-05 MED ORDER — METOCLOPRAMIDE HCL 5 MG/ML IJ SOLN: 5.0000 mg | Freq: Three times a day (TID) | INTRAMUSCULAR | Status: DC | PRN

## 2017-08-05 MED ORDER — SODIUM CHLORIDE 0.9 % IR SOLN
Status: DC | PRN
  Administered 2017-08-05: 1000 mL

## 2017-08-05 MED ORDER — VITAMIN C 500 MG PO TABS
1000.0000 mg | ORAL_TABLET | Freq: Every day | ORAL | Status: DC
  Administered 2017-08-06: 11:00:00 1000 mg via ORAL
  Filled 2017-08-05: qty 2

## 2017-08-05 MED ORDER — HYDROCODONE-ACETAMINOPHEN 5-325 MG PO TABS: 2.0000 | ORAL_TABLET | ORAL | Status: DC | PRN

## 2017-08-05 MED ORDER — BUPIVACAINE HCL 0.25 % IJ SOLN
INTRAMUSCULAR | Status: DC | PRN
  Administered 2017-08-05: 30 mL

## 2017-08-05 SURGICAL SUPPLY — 60 items
BAG ZIPLOCK 12X15 (MISCELLANEOUS) IMPLANT
BANDAGE ACE 4X5 VEL STRL LF (GAUZE/BANDAGES/DRESSINGS) ×3 IMPLANT
BANDAGE ACE 6X5 VEL STRL LF (GAUZE/BANDAGES/DRESSINGS) ×3 IMPLANT
BLADE SAW RECIPROCATING 77.5 (BLADE) ×3 IMPLANT
CAPT KNEE TRIATH TK-4 ×3 IMPLANT
CHLORAPREP W/TINT 26ML (MISCELLANEOUS) ×6 IMPLANT
COVER SURGICAL LIGHT HANDLE (MISCELLANEOUS) ×3 IMPLANT
CUFF TOURN SGL QUICK 34 (TOURNIQUET CUFF) ×2
CUFF TRNQT CYL 34X4X40X1 (TOURNIQUET CUFF) ×1 IMPLANT
DECANTER SPIKE VIAL GLASS SM (MISCELLANEOUS) ×6 IMPLANT
DERMABOND ADVANCED (GAUZE/BANDAGES/DRESSINGS) ×2
DERMABOND ADVANCED .7 DNX12 (GAUZE/BANDAGES/DRESSINGS) ×1 IMPLANT
DRAPE SHEET LG 3/4 BI-LAMINATE (DRAPES) ×6 IMPLANT
DRAPE U-SHAPE 47X51 STRL (DRAPES) ×3 IMPLANT
DRSG AQUACEL AG ADV 3.5X10 (GAUZE/BANDAGES/DRESSINGS) ×3 IMPLANT
DRSG AQUACEL AG ADV 3.5X14 (GAUZE/BANDAGES/DRESSINGS) ×3 IMPLANT
DRSG TEGADERM 4X4.75 (GAUZE/BANDAGES/DRESSINGS) IMPLANT
ELECT BLADE TIP CTD 4 INCH (ELECTRODE) ×3 IMPLANT
ELECT REM PT RETURN 15FT ADLT (MISCELLANEOUS) ×3 IMPLANT
EVACUATOR 1/8 PVC DRAIN (DRAIN) IMPLANT
GAUZE SPONGE 4X4 12PLY STRL (GAUZE/BANDAGES/DRESSINGS) ×3 IMPLANT
GLOVE BIO SURGEON STRL SZ8.5 (GLOVE) ×6 IMPLANT
GLOVE BIOGEL PI IND STRL 7.0 (GLOVE) ×6 IMPLANT
GLOVE BIOGEL PI IND STRL 8.5 (GLOVE) ×1 IMPLANT
GLOVE BIOGEL PI INDICATOR 7.0 (GLOVE) ×12
GLOVE BIOGEL PI INDICATOR 8.5 (GLOVE) ×2
GOWN SPEC L3 XXLG W/TWL (GOWN DISPOSABLE) ×3 IMPLANT
GOWN STRL REUS W/ TWL LRG LVL4 (GOWN DISPOSABLE) ×2 IMPLANT
GOWN STRL REUS W/TWL LRG LVL4 (GOWN DISPOSABLE) ×4
GOWN STRL REUS W/TWL XL LVL4 (GOWN DISPOSABLE) ×6 IMPLANT
HANDPIECE INTERPULSE COAX TIP (DISPOSABLE) ×2
HOOD PEEL AWAY FLYTE STAYCOOL (MISCELLANEOUS) ×6 IMPLANT
MARKER SKIN DUAL TIP RULER LAB (MISCELLANEOUS) ×3 IMPLANT
NEEDLE SPNL 18GX3.5 QUINCKE PK (NEEDLE) ×3 IMPLANT
NS IRRIG 1000ML POUR BTL (IV SOLUTION) ×3 IMPLANT
PACK TOTAL KNEE CUSTOM (KITS) ×3 IMPLANT
PADDING CAST COTTON 6X4 STRL (CAST SUPPLIES) ×3 IMPLANT
POSITIONER SURGICAL ARM (MISCELLANEOUS) ×3 IMPLANT
SAW OSC TIP CART 19.5X105X1.3 (SAW) ×3 IMPLANT
SEALER BIPOLAR AQUA 6.0 (INSTRUMENTS) ×3 IMPLANT
SET HNDPC FAN SPRY TIP SCT (DISPOSABLE) ×1 IMPLANT
SET PAD KNEE POSITIONER (MISCELLANEOUS) ×3 IMPLANT
SPONGE DRAIN TRACH 4X4 STRL 2S (GAUZE/BANDAGES/DRESSINGS) IMPLANT
SPONGE LAP 18X18 X RAY DECT (DISPOSABLE) IMPLANT
SUCTION FRAZIER HANDLE 12FR (TUBING) ×2
SUCTION TUBE FRAZIER 12FR DISP (TUBING) ×1 IMPLANT
SUT MNCRL AB 3-0 PS2 18 (SUTURE) ×3 IMPLANT
SUT MON AB 2-0 CT1 36 (SUTURE) ×6 IMPLANT
SUT STRATAFIX PDO 1 14 VIOLET (SUTURE) ×2
SUT STRATFX PDO 1 14 VIOLET (SUTURE) ×1
SUT VIC AB 1 CT1 36 (SUTURE) ×15 IMPLANT
SUT VIC AB 2-0 CT1 27 (SUTURE) ×2
SUT VIC AB 2-0 CT1 TAPERPNT 27 (SUTURE) ×1 IMPLANT
SUTURE STRATFX PDO 1 14 VIOLET (SUTURE) ×1 IMPLANT
SYR 50ML LL SCALE MARK (SYRINGE) ×3 IMPLANT
TOWER CARTRIDGE SMART MIX (DISPOSABLE) IMPLANT
TRAY FOLEY W/METER SILVER 16FR (SET/KITS/TRAYS/PACK) IMPLANT
WATER STERILE IRR 1000ML POUR (IV SOLUTION) ×6 IMPLANT
WRAP KNEE MAXI GEL POST OP (GAUZE/BANDAGES/DRESSINGS) ×3 IMPLANT
YANKAUER SUCT BULB TIP 10FT TU (MISCELLANEOUS) ×3 IMPLANT

## 2017-08-05 NOTE — Interval H&P Note (Signed)
History and Physical Interval Note:  08/05/2017 10:11 AM  [name] [name]  has presented today for surgery, with the diagnosis of Degenerative joint disease left knee  The various methods of treatment have been discussed with the patient and family. After consideration of risks, benefits and other options for treatment, the patient has consented to  Procedure(s) with comments: LEFT TOTAL KNEE ARTHROPLASTY WITH COMPUTER NAVIGATION (Left) - Needs RNFA as a surgical intervention .  The patient's history has been reviewed, patient examined, no change in status, stable for surgery.  I have reviewed the patient's chart and labs.  Questions were answered to the patient's satisfaction.     Hilton Cork Damarco Keysor

## 2017-08-05 NOTE — Anesthesia Preprocedure Evaluation (Addendum)
Anesthesia Evaluation  Patient identified by MRN, date of birth, ID band Patient awake    Reviewed: Allergy & Precautions, NPO status , Patient's Chart, lab work & pertinent test results  Airway Mallampati: II  TM Distance: >3 FB Neck ROM: Full    Dental no notable dental hx.    Pulmonary former smoker,    Pulmonary exam normal breath sounds clear to auscultation       Cardiovascular hypertension, Pt. on medications and Pt. on home beta blockers + CAD, + Past MI and + CABG (2008)  Normal cardiovascular exam Rhythm:Regular Rate:Normal  EF 35%   Neuro/Psych negative neurological ROS  negative psych ROS   GI/Hepatic negative GI ROS, Neg liver ROS,   Endo/Other  Hypothyroidism   Renal/GU negative Renal ROS  negative genitourinary   Musculoskeletal negative musculoskeletal ROS (+)   Abdominal   Peds negative pediatric ROS (+)  Hematology negative hematology ROS (+)   Anesthesia Other Findings   Reproductive/Obstetrics negative OB ROS                            Anesthesia Physical Anesthesia Plan  ASA: III  Anesthesia Plan: Spinal   Post-op Pain Management:  Regional for Post-op pain   Induction: Intravenous  PONV Risk Score and Plan: 1 and Ondansetron and Treatment may vary due to age or medical condition  Airway Management Planned: Simple Face Mask  Additional Equipment:   Intra-op Plan:   Post-operative Plan:   Informed Consent: I have reviewed the patients History and Physical, chart, labs and discussed the procedure including the risks, benefits and alternatives for the proposed anesthesia with the patient or authorized representative who has indicated his/her understanding and acceptance.   Dental advisory given  Plan Discussed with: CRNA  Anesthesia Plan Comments:         Anesthesia Quick Evaluation

## 2017-08-05 NOTE — Anesthesia Procedure Notes (Signed)
Anesthesia Regional Block: Adductor canal block   Pre-Anesthetic Checklist: ,, timeout performed, Correct Patient, Correct Site, Correct Laterality, Correct Procedure, Correct Position, site marked, Risks and benefits discussed,  Surgical consent,  Pre-op evaluation,  At surgeon's request and post-op pain management  Laterality: Left and Lower  Prep: Maximum Sterile Barrier Precautions used, chloraprep       Needles:  Injection technique: Single-shot  Needle Type: Echogenic Stimulator Needle     Needle Length: 10cm      Additional Needles:   Procedures:,,,, ultrasound used (permanent image in chart),,,,  Narrative:  Start time: 08/05/2017 10:32 AM End time: 08/05/2017 10:37 AM Injection made incrementally with aspirations every 5 mL.  Performed by: Personally  Anesthesiologist: Montez Hageman, MD  Additional Notes: Risks, benefits and alternative to block explained extensively.  Patient tolerated procedure well, without complications.

## 2017-08-05 NOTE — Transfer of Care (Signed)
Immediate Anesthesia Transfer of Care Note  Patient: ENGLISH [place]  Procedure(s) Performed: LEFT TOTAL KNEE ARTHROPLASTY WITH COMPUTER NAVIGATION (Left Knee)  Patient Location: PACU  Anesthesia Type:Spinal  Level of Consciousness: awake, oriented, drowsy and patient cooperative  Airway & Oxygen Therapy: Patient Spontanous Breathing and Patient connected to face mask oxygen  Post-op Assessment: Report given to RN and Post -op Vital signs reviewed and stable  Post vital signs: Reviewed and stable  Last Vitals:  Vitals:   08/05/17 1033 08/05/17 1034  BP:    Pulse: 78 75  Resp: (!) 22 (!) 21  Temp:    SpO2: 100% 98%    Last Pain:  Vitals:   08/05/17 0742  TempSrc: Oral      Patients Stated Pain Goal: 4 (57/47/34 0370)  Complications: No apparent anesthesia complications

## 2017-08-05 NOTE — Op Note (Signed)
OPERATIVE REPORT  SURGEON: Rod Can, MD   ASSISTANT: Sherlean Foot, RNFA.  PREOPERATIVE DIAGNOSIS: Left knee arthritis.   POSTOPERATIVE DIAGNOSIS: Left knee arthritis.   PROCEDURE: Left total knee arthroplasty.   IMPLANTS: Stryker Triathlon CR femur, size 6. Stryker Tritanium tibia, size 6. X3 polyethelyene insert, size 11 mm, CS. 3 button asymmetric patella, size 38 mm.  ANESTHESIA:  Regional and Spinal  TOURNIQUET TIME: Not utilized.   ESTIMATED BLOOD LOSS:-525 mL    ANTIBIOTICS: 2 g Ancef.  DRAINS: None.  COMPLICATIONS: None   CONDITION: PACU - hemodynamically stable.   BRIEF CLINICAL NOTE: [name] [name] is a 71 y.o. male with a long-standing history of Left knee arthritis. After failing conservative management, the patient was indicated for total knee arthroplasty. The risks, benefits, and alternatives to the procedure were explained, and the patient elected to proceed.  PROCEDURE IN DETAIL: Adductor canal block was obtained in the pre-op holding area. Once inside the operative room, spinal anesthesia was obtained, and a foley catheter was inserted. The patient was then positioned, a nonsterile tourniquet was placed, and the lower extremity was prepped and draped in the normal sterile surgical fashion. A time-out was called verifying side and site of surgery. The patient received IV antibiotics within 60 minutes of beginning the procedure. The tourniquet was not utilized.  An anterior approach to the knee was performed utilizing a midvastus arthrotomy. A medial release was performed and the patellar fat pad was excised. Stryker navigation was used to cut the distal femur perpendicular to the mechanical axis. A freehand patellar resection was performed, and the patella was sized an prepared with 3 lug holes.  Nagivation was used to make a neutral proximal tibia  resection, taking 9 mm of bone from the less affected lateral side with 3 degrees of slope. The menisci were excised. A spacer block was placed, and the alignment and balance in extension were confirmed.   The distal femur was sized using the 3-degree external rotation guide referencing the posterior femoral cortex. The appropriate 4-in-1 cutting block was pinned into place. Rotation was checked using Whiteside's line, the epicondylar axis, and then confirmed with a spacer block in flexion. The remaining femoral cuts were performed, taking care to protect the MCL.  The tibia was sized and the trial tray was pinned into place. The remaining trail components were inserted. The knee was stable to varus and valgus stress through a full range of motion. The patella tracked centrally, and the PCL was well balanced. The trial components were removed, and the proximal tibial surface was prepared. Final components were impacted into place. The knee was tested for a final time and found to be well balanced.  The wound was copiously irrigated with normal saline with pulse lavage. Marcaine solution was injected into the periarticular soft tissue. The wound was closed in layers using #1 Vicryl and Stratafix for the fascia, 2-0 Vicryl for the subcutaneous fat, 2-0 Monocryl for the deep dermal layer, 3-0 running Monocryl subcuticular Stitch, and Dermabond for the skin. Once the glue was fully dried, an Aquacell Ag and compressive dressing were applied. Tthe patient was transported to the recovery room in stable condition. Sponge, needle, and instrument counts were correct at the end of the case x2. The patient tolerated the procedure well and there were no known complications.

## 2017-08-05 NOTE — Discharge Instructions (Signed)
° °Dr. Makeyla Govan °Total Joint Specialist °[facility] °[address] °[place], [place] [address] °[phone] ° °TOTAL KNEE REPLACEMENT POSTOPERATIVE DIRECTIONS ° ° ° °Knee Rehabilitation, Guidelines Following Surgery  °Results after knee surgery are often greatly improved when you follow the exercise, range of motion and muscle strengthening exercises prescribed by your doctor. Safety measures are also important to protect the knee from further injury. Any time any of these exercises cause you to have increased pain or swelling in your knee joint, decrease the amount until you are comfortable again and slowly increase them. If you have problems or questions, call your caregiver or physical therapist for advice.  ° °WEIGHT BEARING °Weight bearing as tolerated with assist device (walker, cane, etc) as directed, use it as long as suggested by your surgeon or therapist, typically at least 4-6 weeks. ° °HOME CARE INSTRUCTIONS  °Remove items at home which could result in a fall. This includes throw rugs or furniture in walking pathways.  °Continue medications as instructed at time of discharge. °You may have some home medications which will be placed on hold until you complete the course of blood thinner medication.  °You may start showering once you are discharged home but do not submerge the incision under water. Just pat the incision dry and apply a dry gauze dressing on daily. °Walk with walker as instructed.  °You may resume a sexual relationship in one month or when given the OK by your doctor.  °· Use walker as long as suggested by your caregivers. °· Avoid periods of inactivity such as sitting longer than an hour when not asleep. This helps prevent blood clots.  °You may put full weight on your legs and walk as much as is comfortable.  °You may return to work once you are cleared by your doctor.  °Do not drive a car for 6 weeks or until released by you surgeon.  °· Do not drive  while taking narcotics.  °Wear the elastic stockings for three weeks following surgery during the day but you may remove then at night. °Make sure you keep all of your appointments after your operation with all of your doctors and caregivers. You should call the office at the above phone number and make an appointment for approximately two weeks after the date of your surgery. °Do not remove your surgical dressing. The dressing is waterproof; you may take showers in 3 days, but do not take tub baths or submerge the dressing. °Please pick up a stool softener and laxative for home use as long as you are requiring pain medications. °· ICE to the affected knee every three hours for 30 minutes at a time and then as needed for pain and swelling.  Continue to use ice on the knee for pain and swelling from surgery. You may notice swelling that will progress down to the foot and ankle.  This is normal after surgery.  Elevate the leg when you are not up walking on it.   °It is important for you to complete the blood thinner medication as prescribed by your doctor. °· Continue to use the breathing machine which will help keep your temperature down.  It is common for your temperature to cycle up and down following surgery, especially at night when you are not up moving around and exerting yourself.  The breathing machine keeps your lungs expanded and your temperature down. ° °RANGE OF MOTION AND STRENGTHENING EXERCISES  °Rehabilitation of the knee is important following   a knee injury or an operation. After just a few days of immobilization, the muscles of the thigh which control the knee become weakened and shrink (atrophy). Knee exercises are designed to build up the tone and strength of the thigh muscles and to improve knee motion. Often times heat used for twenty to thirty minutes before working out will loosen up your tissues and help with improving the range of motion but do not use heat for the first two weeks following  surgery. These exercises can be done on a training (exercise) mat, on the floor, on a table or on a bed. Use what ever works the best and is most comfortable for you Knee exercises include:  °Leg Lifts - While your knee is still immobilized in a splint or cast, you can do straight leg raises. Lift the leg to 60 degrees, hold for 3 sec, and slowly lower the leg. Repeat 10-20 times 2-3 times daily. Perform this exercise against resistance later as your knee gets better.  °Quad and Hamstring Sets - Tighten up the muscle on the front of the thigh (Quad) and hold for 5-10 sec. Repeat this 10-20 times hourly. Hamstring sets are done by pushing the foot backward against an object and holding for 5-10 sec. Repeat as with quad sets.  °A rehabilitation program following serious knee injuries can speed recovery and prevent re-injury in the future due to weakened muscles. Contact your doctor or a physical therapist for more information on knee rehabilitation.  ° °SKILLED REHAB INSTRUCTIONS: °If the patient is transferred to a skilled rehab facility following release from the hospital, a list of the current medications will be sent to the facility for the patient to continue.  When discharged from the skilled rehab facility, please have the facility set up the patient's Home Health Physical Therapy prior to being released. Also, the skilled facility will be responsible for providing the patient with their medications at time of release from the facility to include their pain medication, the muscle relaxants, and their blood thinner medication. If the patient is still at the rehab facility at time of the two week follow up appointment, the skilled rehab facility will also need to assist the patient in arranging follow up appointment in our office and any transportation needs. ° °MAKE SURE YOU:  °Understand these instructions.  °Will watch your condition.  °Will get help right away if you are not doing well or get worse.   ° ° °Pick up stool softner and laxative for home use following surgery while on pain medications. °Do NOT remove your dressing. You may shower.  °Do not take tub baths or submerge incision under water. °May shower starting three days after surgery. °Please use a clean towel to pat the incision dry following showers. °Continue to use ice for pain and swelling after surgery. °Do not use any lotions or creams on the incision until instructed by your surgeon. ° °

## 2017-08-05 NOTE — Anesthesia Procedure Notes (Signed)
Spinal  Patient location during procedure: OR Start time: 08/05/2017 11:10 AM End time: 08/05/2017 11:16 AM Staffing Resident/CRNA: Ofilia Neas, CRNA Performed: resident/CRNA  Preanesthetic Checklist Completed: patient identified, site marked, surgical consent, pre-op evaluation, timeout performed, IV checked, risks and benefits discussed and monitors and equipment checked Spinal Block Patient position: sitting Prep: ChloraPrep and site prepped and draped Patient monitoring: heart rate, continuous pulse ox and blood pressure Approach: midline Location: L2-3 Injection technique: single-shot Needle Needle type: Spinocan  Needle gauge: 22 G Needle length: 9 cm Additional Notes Kit expiration date checked.  Negative heme, paresthesia. tolerated well.

## 2017-08-05 NOTE — Anesthesia Postprocedure Evaluation (Signed)
Anesthesia Post Note  Patient: [name] [name]  Procedure(s) Performed: LEFT TOTAL KNEE ARTHROPLASTY WITH COMPUTER NAVIGATION (Left Knee)     Patient location during evaluation: PACU Anesthesia Type: Spinal Level of consciousness: awake and alert Pain management: pain level controlled Vital Signs Assessment: post-procedure vital signs reviewed and stable Respiratory status: spontaneous breathing and respiratory function stable Cardiovascular status: blood pressure returned to baseline and stable Postop Assessment: no headache, no backache, spinal receding and no apparent nausea or vomiting Anesthetic complications: no    Last Vitals:  Vitals:   08/05/17 1700 08/05/17 1809  BP: 129/86 119/71  Pulse: 69 62  Resp: 16 16  Temp: 36.6 C 36.5 C  SpO2: 100% 100%    Last Pain:  Vitals:   08/05/17 1809  TempSrc: Oral                 Montez Hageman

## 2017-08-06 ENCOUNTER — Encounter (HOSPITAL_COMMUNITY): Payer: Self-pay | Admitting: Orthopedic Surgery

## 2017-08-06 DIAGNOSIS — M1712 Unilateral primary osteoarthritis, left knee: Secondary | ICD-10-CM | POA: Diagnosis not present

## 2017-08-06 LAB — BASIC METABOLIC PANEL
ANION GAP: 5 (ref 5–15)
BUN: 18 mg/dL (ref 6–20)
CO2: 26 mmol/L (ref 22–32)
Calcium: 8.6 mg/dL — ABNORMAL LOW (ref 8.9–10.3)
Chloride: 106 mmol/L (ref 101–111)
Creatinine, Ser: 1.02 mg/dL (ref 0.61–1.24)
GFR calc Af Amer: >60 mL/min (ref >60)
GLUCOSE: 152 mg/dL — AB (ref 65–99)
POTASSIUM: 4.6 mmol/L (ref 3.5–5.1)
Sodium: 137 mmol/L (ref 135–145)

## 2017-08-06 LAB — CBC
HEMATOCRIT: 34.2 % — AB (ref 39.0–52.0)
HEMOGLOBIN: 11.5 g/dL — AB (ref 13.0–17.0)
MCH: 31.3 pg (ref 26.0–34.0)
MCHC: 33.6 g/dL (ref 30.0–36.0)
MCV: 93.2 fL (ref 78.0–100.0)
Platelets: 124 10*3/uL — ABNORMAL LOW (ref 150–400)
RBC: 3.67 MIL/uL — AB (ref 4.22–5.81)
RDW: 12.7 % (ref 11.5–15.5)
WBC: 12.2 10*3/uL — AB (ref 4.0–10.5)

## 2017-08-06 MED ORDER — ASPIRIN 81 MG PO CHEW: 81.0000 mg | CHEWABLE_TABLET | Freq: Two times a day (BID) | ORAL | 1 refills | Status: DC

## 2017-08-06 MED ORDER — HYDROCODONE-ACETAMINOPHEN 5-325 MG PO TABS: 1.0000 | ORAL_TABLET | ORAL | 0 refills | Status: DC | PRN

## 2017-08-06 MED ORDER — ONDANSETRON HCL 4 MG PO TABS: 4.0000 mg | ORAL_TABLET | Freq: Four times a day (QID) | ORAL | 0 refills | Status: DC | PRN

## 2017-08-06 MED ORDER — DOCUSATE SODIUM 100 MG PO CAPS: 100.0000 mg | ORAL_CAPSULE | Freq: Two times a day (BID) | ORAL | 0 refills | Status: DC

## 2017-08-06 MED ORDER — SENNA 8.6 MG PO TABS: 2.0000 | ORAL_TABLET | Freq: Every day | ORAL | 0 refills | Status: DC

## 2017-08-06 NOTE — Discharge Summary (Signed)
Physician Discharge Summary  Patient ID: [name] [name] MRN: [id number] DOB/AGE: 72-Aug-1947 72 y.o.  Admit date: 08/05/2017 Discharge date: 08/06/2017  Admission Diagnoses:  Degenerative arthritis of left knee  Discharge Diagnoses:  Principal Problem:   Degenerative arthritis of left knee Active Problems:   Osteoarthritis of left knee   Primary localized osteoarthritis of left knee   Past Medical History:  Diagnosis Date  . ALLERGIC RHINITIS 10/31/2007   Qualifier: Diagnosis of  By: Jenny Reichmann MD, [place]   . Allergy   . CHOLECYSTECTOMY, HX OF 06/04/2007   Qualifier: Diagnosis of  By: Danny Lawless CMA, [place]    . COLONIC POLYPS, HX OF 10/31/2007   Qualifier: Diagnosis of  By: Jenny Reichmann MD, [place], HX OF 06/04/2007   Qualifier: Diagnosis of  By: [place], [place]    . CORONARY ARTERY DISEASE 06/04/2007   Qualifier: Diagnosis of  By: [place], [place]    . Diverticulosis   . Erectile dysfunction   . ERECTILE DYSFUNCTION 06/04/2007   Qualifier: History of  By: Danny Lawless CMA, [place]    . Eye twitch    right eye since chilhood   . HYPERLIPIDEMIA 06/04/2007   Qualifier: Diagnosis of  By: Danny Lawless CMA, [place]    . Hypersomnolence   . HYPERTENSION 06/04/2007   Qualifier: Diagnosis of  By: [place], [place]    . Hypothyroidism   . ISCHEMIC CARDIOMYOPATHY 06/04/2007   Qualifier: Diagnosis of  By: Danny Lawless CMA, [place]    . Myocardial infarction [facility])    per patient , his occurred in 1998   . NICM (nonischemic cardiomyopathy) ([place])   . OSA (obstructive sleep apnea)   . Osteoarthritis   . PLMD (periodic limb movement disorder)   . Sleep apnea    no cpap' per patient , "i was checked for it and they said i didnt have it "   . Vertigo   . Vitamin D deficiency     Surgeries: Procedure(s): LEFT TOTAL KNEE ARTHROPLASTY WITH COMPUTER NAVIGATION on 08/05/2017   Consultants (if any):   Discharged Condition: Improved  Hospital Course: [name] [name] is an 71 y.o. male who was admitted 08/05/2017 with a diagnosis of Degenerative arthritis of left knee and went to the operating room on 08/05/2017 and underwent the above named procedures.    He was given perioperative antibiotics:  Anti-infectives (From admission, onward)   Start     Dose/Rate Route Frequency Ordered Stop   08/05/17 1700  ceFAZolin (ANCEF) IVPB 2g/100 mL premix     2 g 200 mL/hr over 30 Minutes Intravenous Every 6 hours 08/05/17 1510 08/06/17 0050   08/05/17 0713  ceFAZolin (ANCEF) IVPB 2g/100 mL premix     2 g 200 mL/hr over 30 Minutes Intravenous On call to O.R. 08/05/17 6720 08/05/17 1120    .  He was given sequential compression devices, early ambulation, and ASA for DVT prophylaxis.  He benefited maximally from the hospital stay and there were no complications.    Recent vital signs:  Vitals:   08/06/17 0954 08/06/17 1342  BP: 99/65 105/67  Pulse: 71 71  Resp: 18 16  Temp: 98 F (36.7 C) (!) 97.5 F (36.4 C)  SpO2: 95% 96%    Recent laboratory studies:  Lab Results  Component Value Date   HGB 11.5 (L) 08/06/2017   HGB 14.7 07/30/2017   HGB 14.7 06/14/2017   Lab Results  Component Value Date   WBC 12.2 (  H) 08/06/2017   PLT 124 (L) 08/06/2017   No results found for: INR Lab Results  Component Value Date   NA 137 08/06/2017   K 4.6 08/06/2017   CL 106 08/06/2017   CO2 26 08/06/2017   BUN 18 08/06/2017   CREATININE 1.02 08/06/2017   GLUCOSE 152 (H) 08/06/2017    Discharge Medications:   Allergies as of 08/06/2017      Reactions   Ace Inhibitors Cough   Rosuvastatin Other (See Comments)   Muscle pains   Statins Other (See Comments)   Muscle pains      Medication List    STOP taking these medications   aspirin 325 MG tablet Replaced by:  aspirin 81 MG chewable tablet     TAKE these medications   aspirin 81 MG chewable tablet Chew 1 tablet (81 mg total) by mouth 2 (two) times daily. Replaces:  aspirin 325 MG tablet    docusate sodium 100 MG capsule Commonly known as:  COLACE Take 1 capsule (100 mg total) by mouth 2 (two) times daily.   HYDROcodone-acetaminophen 5-325 MG tablet Commonly known as:  NORCO/VICODIN Take 1-2 tablets by mouth every 4 (four) hours as needed (PRN knee pain).   meloxicam 15 MG tablet Commonly known as:  MOBIC Take 15 mg by mouth daily.   metoprolol succinate 50 MG 24 hr tablet Commonly known as:  TOPROL-XL Take 1 tablet (50 mg total) by mouth daily.   ondansetron 4 MG tablet Commonly known as:  ZOFRAN Take 1 tablet (4 mg total) by mouth every 6 (six) hours as needed for nausea.   rosuvastatin 5 MG tablet Commonly known as:  CRESTOR Take 1 tablet (5 mg total) by mouth every other day.   senna 8.6 MG Tabs tablet Commonly known as:  SENOKOT Take 2 tablets (17.2 mg total) by mouth at bedtime.   valsartan 320 MG tablet Commonly known as:  DIOVAN Take 1 tablet (320 mg total) by mouth daily.   VITAMIN C PO Take 1 tablet by mouth daily.   VITAMIN D PO Take 1 capsule by mouth daily.            Durable Medical Equipment  (From admission, onward)        Start     Ordered   08/06/17 1246  For home use only DME Eelevated commode seat  Once     08/06/17 1246      Diagnostic Studies: Dg Knee Left Port  Result Date: 08/05/2017 CLINICAL DATA:  Postoperative radiographs following left total knee joint replacement. EXAM: PORTABLE LEFT KNEE - 1-2 VIEW COMPARISON:  None in PACs FINDINGS: The patient has undergone left total knee joint prosthesis placement. Radiographic positioning of the prosthetic components is good. The interface with the native bone appears normal. There is fluid and air within the anterior aspect of the joint space. IMPRESSION: No immediate postprocedure complication following left total knee joint prosthesis placement. Electronically Signed   By: David  Martinique M.D.   On: 08/05/2017 14:38    Disposition: 01-Home or Self Care  Discharge  Instructions    Call MD / Call 911   Complete by:  As directed    If you experience chest pain or shortness of breath, CALL 911 and be transported to the hospital emergency room.  If you develope a fever above 101 F, pus (white drainage) or increased drainage or redness at the wound, or calf pain, call your surgeon's office.   Constipation Prevention  Complete by:  As directed    Drink plenty of fluids.  Prune juice may be helpful.  You may use a stool softener, such as Colace (over the counter) 100 mg twice a day.  Use MiraLax (over the counter) for constipation as needed.   Diet - low sodium heart healthy   Complete by:  As directed    Do not put a pillow under the knee. Place it under the heel.   Complete by:  As directed    Driving restrictions   Complete by:  As directed    No driving for 6 weeks   Increase activity slowly as tolerated   Complete by:  As directed    Lifting restrictions   Complete by:  As directed    No lifting for 6 weeks   TED hose   Complete by:  As directed    Use stockings (TED hose) for 2 weeks on both leg(s).  You may remove them at night for sleeping.      Follow-up Information    Marriah Sanderlin, Aaron Edelman, MD. Schedule an appointment as soon as possible for a visit in 2 week(s).   Specialty:  Orthopedic Surgery Why:  For wound re-check Contact information: [address] [place] [address] [phone]        [facility] Follow up.   Specialty:  [place] Why:  physical therapy Contact information: [place] [address] [phone]            Signed: Bertram Savin 08/06/2017, 5:43 PM

## 2017-08-06 NOTE — Progress Notes (Signed)
Discharge planning, spoke with patient at bedside. Have chosen [facility] for HH PT. Contacted [facility] for referral. Has RW and 3n1. [phone] 

## 2017-08-06 NOTE — Care Management Obs Status (Signed)
[place] NOTIFICATION   Patient Details  Name: [name] [name] MRN: [id number] Date of Birth: Feb 15, 1946   Medicare Observation Status Notification Given:  Yes    Guadalupe Maple, RN 08/06/2017, 1:20 PM

## 2017-08-06 NOTE — Care Management CC44 (Signed)
Condition Code 44 Documentation Completed  Patient Details  Name: [name] [name] MRN: [id number] Date of Birth: May 24, 1946   Condition Code 44 given:  Yes Patient signature on Condition Code 44 notice:  Yes Documentation of 2 MD's agreement:  Yes Code 44 added to claim:  Yes    Guadalupe Maple, RN 08/06/2017, 1:20 PM

## 2017-08-06 NOTE — Progress Notes (Signed)
   Subjective:  Patient reports pain as mild to moderate.  Denies N/V/CP/SOB.  Objective:   VITALS:   Vitals:   08/06/17 0014 08/06/17 0219 08/06/17 0521 08/06/17 0954  BP: 119/73 109/65 111/65 99/65  Pulse: 72 74 67 71  Resp: 18 16 18 18   Temp: 97.8 F (36.6 C) (!) 97.5 F (36.4 C) 97.8 F (36.6 C) 98 F (36.7 C)  TempSrc: Oral Oral Oral Oral  SpO2: 98% 96% 96% 95%  Weight:      Height:        NAD ABD soft Sensation intact distally Intact pulses distally Dorsiflexion/Plantar flexion intact Incision: dressing C/D/I Compartment soft   Lab Results  Component Value Date   WBC 12.2 (H) 08/06/2017   HGB 11.5 (L) 08/06/2017   HCT 34.2 (L) 08/06/2017   MCV 93.2 08/06/2017   PLT 124 (L) 08/06/2017   BMET    Component Value Date/Time   NA 137 08/06/2017 0525   K 4.6 08/06/2017 0525   CL 106 08/06/2017 0525   CO2 26 08/06/2017 0525   GLUCOSE 152 (H) 08/06/2017 0525   BUN 18 08/06/2017 0525   CREATININE 1.02 08/06/2017 0525   CREATININE 1.02 11/06/2016 1255   CALCIUM 8.6 (L) 08/06/2017 0525   GFRNONAA >60 08/06/2017 0525   GFRAA >60 08/06/2017 0525     Assessment/Plan: 1 Day Post-Op   Principal Problem:   Degenerative arthritis of left knee Active Problems:   Osteoarthritis of left knee   WBAT with walker ASA, SCDs, TEDs PO pain control PT/OT Dispo: D/C home with HHPT   Hilton Cork Conchita Truxillo 08/06/2017, 12:21 PM   Rod Can, MD Cell [phone]

## 2017-08-06 NOTE — Progress Notes (Signed)
Patient discharged to home with wife. Given all belongings, instructions, prescriptions, equipment. Patient verbalized understanding of all instructions. Escorted to pov via w/c.

## 2017-08-06 NOTE — Evaluation (Signed)
Physical Therapy Evaluation Patient Details Name: [name] [name] MRN: [id number] DOB: 06/07/1946 Today's Date: 08/06/2017   History of Present Illness  Pt is a 71 year old male s/p L TKA and hx of R TKA 2014  Clinical Impression  Pt is s/p TKA resulting in the deficits listed below (see PT Problem List).  Pt will benefit from skilled PT to increase their independence and safety with mobility to allow discharge to the venue listed below.  Pt ambulated in hallway good distance and performed LE exercises POD #1.  Pt plans to d/c home with spouse tomorrow.     Follow Up Recommendations Home health PT    Equipment Recommendations  None recommended by PT    Recommendations for Other Services       Precautions / Restrictions Precautions Precautions: Knee Restrictions Other Position/Activity Restrictions: WBAT      Mobility  Bed Mobility Overal bed mobility: Modified Independent                Transfers Overall transfer level: Needs assistance Equipment used: Rolling walker (2 wheeled) Transfers: Sit to/from Stand Sit to Stand: Min guard         General transfer comment: min/guard for safety, cues for having RW ready  Ambulation/Gait Ambulation/Gait assistance: Min guard Ambulation Distance (Feet): 200 Feet Assistive device: Rolling walker (2 wheeled) Gait Pattern/deviations: Step-through pattern;Decreased stance time - left;Antalgic     General Gait Details: slightly antalgic gait however pt performing well with RW, one cue for RW distance  Stairs            Wheelchair Mobility    Modified Rankin (Stroke Patients Only)       Balance                                             Pertinent Vitals/Pain Pain Assessment: 0-10 Pain Score: 3  Pain Location: R knee Pain Descriptors / Indicators: Sore Pain Intervention(s): Limited activity within patient's tolerance;Repositioned;Monitored during session    Home Living  Family/patient expects to be discharged to:: Private residence Living Arrangements: Spouse/significant other   Type of Home: House Home Access: Stairs to enter   Technical brewer of Steps: 3-4 Home Layout: One level Home Equipment: Environmental consultant - 2 wheels;Bedside commode      Prior Function Level of Independence: Independent               Hand Dominance        Extremity/Trunk Assessment        Lower Extremity Assessment Lower Extremity Assessment: LLE deficits/detail LLE Deficits / Details: able to perform SLR, approx 90* AAROM knee flexion sitting edge of chair       Communication   Communication: No difficulties  Cognition Arousal/Alertness: Awake/alert Behavior During Therapy: WFL for tasks assessed/performed Overall Cognitive Status: Within Functional Limits for tasks assessed                                        General Comments      Exercises Total Joint Exercises Ankle Circles/Pumps: AROM;Both;10 reps Quad Sets: AROM;Both;10 reps Heel Slides: AAROM;10 reps;Left;Seated Hip ABduction/ADduction: AROM;Left;10 reps Straight Leg Raises: AROM;Left;10 reps Long Arc Quad: AROM;10 reps;Left   Assessment/Plan    PT Assessment Patient needs continued PT services  PT  Problem List Decreased mobility;Decreased strength;Decreased range of motion;Pain       PT Treatment Interventions Stair training;Gait training;Therapeutic exercise;DME instruction;Therapeutic activities;Patient/family education;Functional mobility training    PT Goals (Current goals can be found in the Care Plan section)  Acute Rehab PT Goals PT Goal Formulation: With patient Time For Goal Achievement: 08/11/17 Potential to Achieve Goals: Good    Frequency 7X/week   Barriers to discharge        Co-evaluation               AM-PAC PT "6 Clicks" Daily Activity  Outcome Measure Difficulty turning over in bed (including adjusting bedclothes, sheets and  blankets)?: None Difficulty moving from lying on back to sitting on the side of the bed? : None Difficulty sitting down on and standing up from a chair with arms (e.g., wheelchair, bedside commode, etc,.)?: A Lot Help needed moving to and from a bed to chair (including a wheelchair)?: A Little Help needed walking in hospital room?: A Little Help needed climbing 3-5 steps with a railing? : A Little 6 Click Score: 19    End of Session   Activity Tolerance: Patient tolerated treatment well Patient left: in chair;with call bell/phone within reach   PT Visit Diagnosis: Other abnormalities of gait and mobility (R26.89)    Time: 1638-4665 PT Time Calculation (min) (ACUTE ONLY): 19 min   Charges:   PT Evaluation $PT Eval Low Complexity: 1 Low     PT G CodesCarmelia Bake, PT, DPT 08/06/2017 Pager: [phone]  York Ram E 08/06/2017, 12:16 PM

## 2017-08-06 NOTE — Progress Notes (Signed)
OT Cancellation Note  Patient Details Name: [name] [name] MRN: [id number] DOB: 1946/02/21   Cancelled Treatment:    Reason Eval/Treat Not Completed: OT screened, no needs identified, will sign off  Pt had other knee replaced 4 years ago, has assistance as needed, 3:1 and tub bench.  He completed adl with nursing standing by this am.  [facility] 08/06/2017, 9:54 AM  Lesle Chris, OTR/L [phone] 08/06/2017

## 2017-08-06 NOTE — Progress Notes (Signed)
Physical Therapy Treatment Patient Details Name: [name] [name] MRN: [id number] DOB: 1945/11/30 Today's Date: 08/06/2017    History of Present Illness Pt is a 71 year old male s/p L TKA and hx of R TKA 2014    PT Comments    Pt dressed and reports he is being discharged home today.  Pt ambulated again in hallway and practiced safe stair technique.  Pt had no further questions or concerns and feels ready to go home today.   Follow Up Recommendations  Home health PT     Equipment Recommendations  None recommended by PT    Recommendations for Other Services       Precautions / Restrictions Precautions Precautions: Knee Restrictions Other Position/Activity Restrictions: WBAT    Mobility  Bed Mobility Overal bed mobility: Modified Independent             General bed mobility comments: pt found standing near window seat tucking in his shirt (completely dressed) with no RW, educated pt to utilize RW at this time and upon d/c for safety for at least a couple days (also to assist with pain control)  Transfers Overall transfer level: Needs assistance Equipment used: Rolling walker (2 wheeled) Transfers: Sit to/from Stand Sit to Stand: Supervision         General transfer comment: min/guard for safety, cues for having RW ready  Ambulation/Gait Ambulation/Gait assistance: Supervision Ambulation Distance (Feet): 320 Feet Assistive device: Rolling walker (2 wheeled) Gait Pattern/deviations: Step-through pattern;Decreased stance time - left;Antalgic     General Gait Details: one cue for keeping LEs within RW   Stairs Stairs: Yes   Stair Management: Step to pattern;Forwards;With walker Number of Stairs: 4 General stair comments: pt performed forwards with RW, pt steady and performed steps without any difficulty, recommended pt have assist available for safety if having a painful day  Wheelchair Mobility    Modified Rankin (Stroke Patients Only)        Balance                                            Cognition Arousal/Alertness: Awake/alert Behavior During Therapy: WFL for tasks assessed/performed Overall Cognitive Status: Within Functional Limits for tasks assessed                                        Exercises Total Joint Exercises Ankle Circles/Pumps: AROM;Both;10 reps Quad Sets: AROM;Both;10 reps Heel Slides: AAROM;10 reps;Left;Seated Hip ABduction/ADduction: AROM;Left;10 reps Straight Leg Raises: AROM;Left;10 reps Long Arc Quad: AROM;10 reps;Left    General Comments        Pertinent Vitals/Pain Pain Assessment: 0-10 Pain Score: 2  Pain Location: R knee Pain Descriptors / Indicators: Sore Pain Intervention(s): Limited activity within patient's tolerance;Monitored during session    Home Living                      Prior Function            PT Goals (current goals can now be found in the care plan section) Progress towards PT goals: Progressing toward goals    Frequency    7X/week      PT Plan Current plan remains appropriate    Co-evaluation  AM-PAC PT "6 Clicks" Daily Activity  Outcome Measure  Difficulty turning over in bed (including adjusting bedclothes, sheets and blankets)?: None Difficulty moving from lying on back to sitting on the side of the bed? : None Difficulty sitting down on and standing up from a chair with arms (e.g., wheelchair, bedside commode, etc,.)?: None Help needed moving to and from a bed to chair (including a wheelchair)?: None Help needed walking in hospital room?: A Little Help needed climbing 3-5 steps with a railing? : A Little 6 Click Score: 22    End of Session   Activity Tolerance: Patient tolerated treatment well Patient left: in chair;with call bell/phone within reach Nurse Communication: Mobility status PT Visit Diagnosis: Other abnormalities of gait and mobility (R26.89)     Time:  7824-2353 PT Time Calculation (min) (ACUTE ONLY): 9 min  Charges:  $Gait Training: 8-22 mins                    G Codes:       Carmelia Bake, PT, DPT 08/06/2017 Pager: [phone]  York Ram E 08/06/2017, 3:46 PM

## 2017-08-11 NOTE — Progress Notes (Signed)
PT Evaluation G-Codes    08/06/17 1215  PT Time Calculation  PT Start Time (ACUTE ONLY) 0917  PT Stop Time (ACUTE ONLY) 0936  PT Time Calculation (min) (ACUTE ONLY) 19 min  PT G-Codes **NOT FOR INPATIENT CLASS**  Functional Assessment Tool Used AM-PAC 6 Clicks Basic Mobility;Clinical judgement  Functional Limitation Mobility: Walking and moving around  Mobility: Walking and Moving Around Current Status (S8110) CJ  Mobility: Walking and Moving Around Goal Status (R1594) CI  PT General Charges  $$ ACUTE PT VISIT 1 Visit  PT Evaluation  $PT Eval Low Complexity 1 Low   Carmelia Bake, PT, DPT 08/11/2017 Pager: [phone]

## 2017-08-31 DIAGNOSIS — H2513 Age-related nuclear cataract, bilateral: Secondary | ICD-10-CM | POA: Diagnosis not present

## 2017-08-31 DIAGNOSIS — H524 Presbyopia: Secondary | ICD-10-CM | POA: Diagnosis not present

## 2017-08-31 DIAGNOSIS — H5203 Hypermetropia, bilateral: Secondary | ICD-10-CM | POA: Diagnosis not present

## 2017-08-31 DIAGNOSIS — H25013 Cortical age-related cataract, bilateral: Secondary | ICD-10-CM | POA: Diagnosis not present

## 2017-08-31 DIAGNOSIS — H52203 Unspecified astigmatism, bilateral: Secondary | ICD-10-CM | POA: Diagnosis not present

## 2017-11-11 NOTE — Progress Notes (Signed)
HPI: FU CAD; history dates back to 1998 when he had a myocardial infarction. He subsequently underwent minimally invasive LIMA to his LAD. Echo May 2015 was technically difficult. Ejection fraction 40-45% and thrombus cannot be excluded. Grade 1 diastolic dysfunction. Mild to moderate left atrial enlargement. Trace mitral and tricuspid regurgitation. Echo repeated in June 2015 and showed no thrombus. Nuclear study May 2016 Showed ejection fraction 40%, prior anterior apical infarct but no ischemia. Since I last saw him,  he has mild dyspnea on exertion but no orthopnea, PND, pedal edema, chest pain or syncope.  He complains of bilateral lower extremity weakness.  Current Outpatient Medications  Medication Sig Dispense Refill  . Ascorbic Acid (VITAMIN C PO) Take 1 tablet by mouth daily.    Marland Kitchen aspirin 325 MG tablet aspirin 325 mg tablet  Take 1 tablet every day by oral route.    . Cholecalciferol (VITAMIN D PO) Take 1 capsule by mouth daily.    . meloxicam (MOBIC) 15 MG tablet Take 15 mg by mouth daily.    . metoprolol succinate (TOPROL-XL) 50 MG 24 hr tablet Take 1 tablet (50 mg total) by mouth daily. 90 tablet 1  . rosuvastatin (CRESTOR) 5 MG tablet Take 1 tablet (5 mg total) by mouth every other day. (Patient taking differently: Take 5 mg by mouth. Takes 1 tab on Mon,Wed, Fri) 40 tablet 3  . valsartan (DIOVAN) 320 MG tablet Take 1 tablet (320 mg total) by mouth daily. 90 tablet 3   No current facility-administered medications for this visit.      Past Medical History:  Diagnosis Date  . ALLERGIC RHINITIS 10/31/2007   Qualifier: Diagnosis of  By: Jenny Reichmann MD, [place]   . Allergy   . CHOLECYSTECTOMY, HX OF 06/04/2007   Qualifier: Diagnosis of  By: Danny Lawless CMA, [place]    . COLONIC POLYPS, HX OF 10/31/2007   Qualifier: Diagnosis of  By: Jenny Reichmann MD, [place], HX OF 06/04/2007   Qualifier: Diagnosis of  By: [place], [place]    . CORONARY ARTERY DISEASE  06/04/2007   Qualifier: Diagnosis of  By: [place], [place]    . Diverticulosis   . Erectile dysfunction   . ERECTILE DYSFUNCTION 06/04/2007   Qualifier: History of  By: Danny Lawless CMA, [place]    . Eye twitch    right eye since chilhood   . HYPERLIPIDEMIA 06/04/2007   Qualifier: Diagnosis of  By: Danny Lawless CMA, [place]    . Hypersomnolence   . HYPERTENSION 06/04/2007   Qualifier: Diagnosis of  By: [place], [place]    . Hypothyroidism   . ISCHEMIC CARDIOMYOPATHY 06/04/2007   Qualifier: Diagnosis of  By: Danny Lawless CMA, [place]    . Myocardial infarction [facility])    per patient , his occurred in 1998   . NICM (nonischemic cardiomyopathy) ([place])   . OSA (obstructive sleep apnea)   . Osteoarthritis   . PLMD (periodic limb movement disorder)   . Sleep apnea    no cpap' per patient , "i was checked for it and they said i didnt have it "   . Vertigo   . Vitamin D deficiency     Past Surgical History:  Procedure Laterality Date  . CHOLECYSTECTOMY    . COLONOSCOPY    . CORONARY ARTERY BYPASS GRAFT    . KNEE ARTHROPLASTY Left 08/05/2017   Procedure: LEFT TOTAL KNEE ARTHROPLASTY WITH COMPUTER NAVIGATION;  Surgeon: Rod Can, MD;  Location: [facility];  Service: Orthopedics;  Laterality: Left;  Needs RNFA  . PENILE PROSTHESIS IMPLANT    . REPLACEMENT TOTAL KNEE  06/12/2013  . spinal cyst    . THORACOTOMY     left anterior; wound exploration and debridement  . TONSILLECTOMY AND ADENOIDECTOMY     age 2    Social History   Socioeconomic History  . Marital status: Married    Spouse name: Not on file  . Number of children: 3  . Years of education: Not on file  . Highest education level: Not on file  Occupational History  . Occupation: truck Education administrator: [employer]  . Financial resource strain: Not on file  . Food insecurity:    Worry: Not on file    Inability: Not on file  . Transportation needs:    Medical: Not on file    Non-medical: Not on file    Tobacco Use  . Smoking status: Former Smoker    Packs/day: 4.00    Years: 17.00    Pack years: 68.00    Types: Cigarettes    Last attempt to quit: 05/07/1981    Years since quitting: 36.5  . Smokeless tobacco: Never Used  . Tobacco comment: Pt states that he would let most of them "burn" pt states that he used anywhere between 4-5PPD  Substance and Sexual Activity  . Alcohol use: Yes    Alcohol/week: 0.0 oz    Comment: rare  . Drug use: No  . Sexual activity: Not on file  Lifestyle  . Physical activity:    Days per week: Not on file    Minutes per session: Not on file  . Stress: Not on file  Relationships  . Social connections:    Talks on phone: Not on file    Gets together: Not on file    Attends religious service: Not on file    Active member of club or organization: Not on file    Attends meetings of clubs or organizations: Not on file    Relationship status: Not on file  . Intimate partner violence:    Fear of current or ex partner: Not on file    Emotionally abused: Not on file    Physically abused: Not on file    Forced sexual activity: Not on file  Other Topics Concern  . Not on file  Social History Narrative  . Not on file    Family History  Problem Relation Age of Onset  . Stomach cancer Mother   . Lung cancer Father   . Heart disease Brother        first MI at 28yo, now 74 for transplant list/ ICM  . Colon cancer Maternal Grandfather   . Esophageal cancer Neg Hx   . Pancreatic cancer Neg Hx   . Prostate cancer Neg Hx   . Rectal cancer Neg Hx     ROS: Bilateral lower extremity weakness but no fevers or chills, productive cough, hemoptysis, dysphasia, odynophagia, melena, hematochezia, dysuria, hematuria, rash, seizure activity, orthopnea, PND, pedal edema, claudication. Remaining systems are negative.  Physical Exam: Well-developed well-nourished in no acute distress.  Skin is warm and dry.  HEENT is normal.  Neck is supple.  Chest is clear to  auscultation with normal expansion.  Cardiovascular exam is regular rate and rhythm.  Abdominal exam nontender or distended. No masses palpated. Extremities show no edema. neuro grossly intact   A/P  1 coronary  artery disease status post coronary artery bypass graft-patient doing well with no dyspnea or chest pain.  Plan to continue medical therapy including aspirin and statin.  2 ischemic cardiomyopathy-continue ARB and beta-blocker.  3 hypertension-blood pressure is controlled.  Continue present medications.  4 hyperlipidemia-continue present dose of Crestor.  He cannot tolerate higher doses.  Patient had laboratories March 2019 personally reviewed.  Total cholesterol 217 with LDL 153.  Liver functions normal.  He was evaluated in the lipid clinic previously.  I will asked him to review for consideration of Repatha.  5 bilateral lower extremity weakness-etiology unclear.  We will check CK although I think muscle weakness related to statin is unlikely.  He may require neurology evaluation in the future.  Kirk Ruths, MD

## 2017-11-16 ENCOUNTER — Encounter: Payer: Self-pay | Admitting: Cardiology

## 2017-11-16 ENCOUNTER — Ambulatory Visit: Payer: Medicare HMO | Admitting: Cardiology

## 2017-11-16 VITALS — BP 128/74 | HR 64 | Ht 70.0 in | Wt 231.0 lb

## 2017-11-16 DIAGNOSIS — I10 Essential (primary) hypertension: Secondary | ICD-10-CM

## 2017-11-16 DIAGNOSIS — E7800 Pure hypercholesterolemia, unspecified: Secondary | ICD-10-CM | POA: Diagnosis not present

## 2017-11-16 DIAGNOSIS — I2510 Atherosclerotic heart disease of native coronary artery without angina pectoris: Secondary | ICD-10-CM | POA: Diagnosis not present

## 2017-11-16 DIAGNOSIS — M6281 Muscle weakness (generalized): Secondary | ICD-10-CM | POA: Diagnosis not present

## 2017-11-16 NOTE — Patient Instructions (Addendum)
Medication Instructions:   NO CHANGE  Labwork:  Your physician recommends that you HAVE LAB WORK TODAY  Follow-Up:  Your physician wants you to follow-up in: [place] will receive a reminder letter in the mail two months in advance. If you don't receive a letter, please call our office to schedule the follow-up appointment.   FOLLOW UP APPOINTMENT WITH PHARM MD TO DISCUSS REPATHA  If you need a refill on your cardiac medications before your next appointment, please call your pharmacy.

## 2017-11-17 LAB — CK: CK TOTAL: 75 U/L (ref 24–204)

## 2017-11-22 ENCOUNTER — Other Ambulatory Visit: Payer: Self-pay | Admitting: Cardiology

## 2017-11-22 DIAGNOSIS — I2510 Atherosclerotic heart disease of native coronary artery without angina pectoris: Secondary | ICD-10-CM

## 2018-02-07 ENCOUNTER — Other Ambulatory Visit: Payer: Self-pay | Admitting: Physician Assistant

## 2018-03-04 ENCOUNTER — Ambulatory Visit: Payer: Medicare HMO | Admitting: Internal Medicine

## 2018-03-07 ENCOUNTER — Other Ambulatory Visit (INDEPENDENT_AMBULATORY_CARE_PROVIDER_SITE_OTHER): Payer: Medicare HMO

## 2018-03-07 ENCOUNTER — Ambulatory Visit (INDEPENDENT_AMBULATORY_CARE_PROVIDER_SITE_OTHER): Payer: Medicare HMO | Admitting: Internal Medicine

## 2018-03-07 ENCOUNTER — Encounter: Payer: Self-pay | Admitting: Internal Medicine

## 2018-03-07 VITALS — BP 118/78 | HR 62 | Temp 98.4°F | Ht 70.0 in | Wt 228.0 lb

## 2018-03-07 DIAGNOSIS — M1712 Unilateral primary osteoarthritis, left knee: Secondary | ICD-10-CM

## 2018-03-07 DIAGNOSIS — Z0001 Encounter for general adult medical examination with abnormal findings: Secondary | ICD-10-CM | POA: Diagnosis not present

## 2018-03-07 DIAGNOSIS — Z0000 Encounter for general adult medical examination without abnormal findings: Secondary | ICD-10-CM

## 2018-03-07 DIAGNOSIS — M25571 Pain in right ankle and joints of right foot: Secondary | ICD-10-CM | POA: Diagnosis not present

## 2018-03-07 DIAGNOSIS — I10 Essential (primary) hypertension: Secondary | ICD-10-CM | POA: Diagnosis not present

## 2018-03-07 DIAGNOSIS — F528 Other sexual dysfunction not due to a substance or known physiological condition: Secondary | ICD-10-CM

## 2018-03-07 DIAGNOSIS — R739 Hyperglycemia, unspecified: Secondary | ICD-10-CM | POA: Diagnosis not present

## 2018-03-07 LAB — CBC WITH DIFFERENTIAL/PLATELET
BASOS ABS: 0.1 10*3/uL (ref 0.0–0.1)
Basophils Relative: 1.1 % (ref 0.0–3.0)
EOS ABS: 0.2 10*3/uL (ref 0.0–0.7)
Eosinophils Relative: 3.7 % (ref 0.0–5.0)
HCT: 42.7 % (ref 39.0–52.0)
HEMOGLOBIN: 14.6 g/dL (ref 13.0–17.0)
LYMPHS PCT: 28.4 % (ref 12.0–46.0)
Lymphs Abs: 1.6 10*3/uL (ref 0.7–4.0)
MCHC: 34.1 g/dL (ref 30.0–36.0)
MCV: 93.6 fl (ref 78.0–100.0)
Monocytes Absolute: 0.5 10*3/uL (ref 0.1–1.0)
Monocytes Relative: 8.6 % (ref 3.0–12.0)
Neutro Abs: 3.4 10*3/uL (ref 1.4–7.7)
Neutrophils Relative %: 58.2 % (ref 43.0–77.0)
Platelets: 143 10*3/uL — ABNORMAL LOW (ref 150.0–400.0)
RBC: 4.57 Mil/uL (ref 4.22–5.81)
RDW: 13.5 % (ref 11.5–15.5)
WBC: 5.8 10*3/uL (ref 4.0–10.5)

## 2018-03-07 LAB — URINALYSIS, ROUTINE W REFLEX MICROSCOPIC
Bilirubin Urine: NEGATIVE
Hgb urine dipstick: NEGATIVE
KETONES UR: NEGATIVE
Leukocytes, UA: NEGATIVE
Nitrite: NEGATIVE
RBC / HPF: NONE SEEN (ref 0–?)
Specific Gravity, Urine: 1.025 (ref 1.000–1.030)
Total Protein, Urine: NEGATIVE
UROBILINOGEN UA: 0.2 (ref 0.0–1.0)
Urine Glucose: NEGATIVE
pH: 5 (ref 5.0–8.0)

## 2018-03-07 LAB — BASIC METABOLIC PANEL
BUN: 22 mg/dL (ref 6–23)
CHLORIDE: 102 meq/L (ref 96–112)
CO2: 29 mEq/L (ref 19–32)
CREATININE: 1.07 mg/dL (ref 0.40–1.50)
Calcium: 9.7 mg/dL (ref 8.4–10.5)
GFR: 72.01 mL/min (ref >60.00)
GLUCOSE: 111 mg/dL — AB (ref 70–99)
Potassium: 4.9 mEq/L (ref 3.5–5.1)
Sodium: 140 mEq/L (ref 135–145)

## 2018-03-07 LAB — LIPID PANEL
CHOLESTEROL: 196 mg/dL (ref 0–200)
HDL: 41.1 mg/dL (ref >39.00)
LDL CALC: 123 mg/dL — AB (ref 0–99)
NonHDL: 155.18
Total CHOL/HDL Ratio: 5
Triglycerides: 159 mg/dL — ABNORMAL HIGH (ref 0.0–149.0)
VLDL: 31.8 mg/dL (ref 0.0–40.0)

## 2018-03-07 LAB — HEPATIC FUNCTION PANEL
ALK PHOS: 51 U/L (ref 39–117)
ALT: 14 U/L (ref 0–53)
AST: 14 U/L (ref 0–37)
Albumin: 4.4 g/dL (ref 3.5–5.2)
BILIRUBIN DIRECT: 0.2 mg/dL (ref 0.0–0.3)
TOTAL PROTEIN: 7.2 g/dL (ref 6.0–8.3)
Total Bilirubin: 0.8 mg/dL (ref 0.2–1.2)

## 2018-03-07 LAB — TSH: TSH: 3.35 u[IU]/mL (ref 0.35–4.50)

## 2018-03-07 LAB — PSA: PSA: 1.28 ng/mL (ref 0.10–4.00)

## 2018-03-07 NOTE — Assessment & Plan Note (Signed)
stable overall by history and exam, recent data reviewed with pt, and pt to continue medical treatment as before,  to f/u any worsening symptoms or concerns, for a1c with labs 

## 2018-03-07 NOTE — Progress Notes (Signed)
Subjective:    Patient ID: [name] [name], male    DOB: [birth year], 72 y.o.   MRN: [id number]  HPI Here for wellness and f/u;  Overall doing ok;  Pt denies Chest pain, worsening SOB, DOE, wheezing, orthopnea, PND, worsening LE edema, palpitations, dizziness or syncope.  Pt denies neurological change such as new headache, facial or extremity weakness.  Pt denies polydipsia, polyuria, or low sugar symptoms. Pt states overall good compliance with treatment and medications, good tolerability, and has been trying to follow appropriate diet.  Pt denies worsening depressive symptoms, suicidal ideation or panic. No fever, night sweats, wt loss, loss of appetite, or other constitutional symptoms.  Pt states good ability with ADL's, has low fall risk, home safety reviewed and adequate, no other significant changes in hearing or vision, and only occasionally active with exercise. Also Has had some left lower lateral leg pain dull achy intermittent mild since first of year, [facility] and not thought to be knee, or lower back or circulation related.  Also with right ankle djd pain better with alleve.  Also [facility] in [facility] past, now with penile prosthesis not working, asks for referral.  Did have zostrix per [place] .  Only taking crestor 5 three times per wk, as this is tolerable.   Past Medical History:  Diagnosis Date  . ALLERGIC RHINITIS 10/31/2007   Qualifier: Diagnosis of  By: Jenny Reichmann MD, Hunt Oris   . Allergy   . CHOLECYSTECTOMY, HX OF 06/04/2007   Qualifier: Diagnosis of  By: Danny Lawless CMA, [place]    . COLONIC POLYPS, HX OF 10/31/2007   Qualifier: Diagnosis of  By: Jenny Reichmann MD, [place], HX OF 06/04/2007   Qualifier: Diagnosis of  By: [place], [place]    . CORONARY ARTERY DISEASE 06/04/2007   Qualifier: Diagnosis of  By: [place], [place]    . Diverticulosis   . Erectile dysfunction   . ERECTILE DYSFUNCTION 06/04/2007   Qualifier:  History of  By: Danny Lawless CMA, [place]    . Eye twitch    right eye since chilhood   . HYPERLIPIDEMIA 06/04/2007   Qualifier: Diagnosis of  By: Danny Lawless CMA, [place]    . Hypersomnolence   . HYPERTENSION 06/04/2007   Qualifier: Diagnosis of  By: [place], [place]    . Hypothyroidism   . ISCHEMIC CARDIOMYOPATHY 06/04/2007   Qualifier: Diagnosis of  By: Danny Lawless CMA, [place]    . Myocardial infarction [facility])    per patient , his occurred in 1998   . NICM (nonischemic cardiomyopathy) ([place])   . OSA (obstructive sleep apnea)   . Osteoarthritis   . PLMD (periodic limb movement disorder)   . Sleep apnea    no cpap' per patient , "i was checked for it and they said i didnt have it "   . Vertigo   . Vitamin D deficiency    Past Surgical History:  Procedure Laterality Date  . CHOLECYSTECTOMY    . COLONOSCOPY    . CORONARY ARTERY BYPASS GRAFT    . KNEE ARTHROPLASTY Left 08/05/2017   Procedure: LEFT TOTAL KNEE ARTHROPLASTY WITH COMPUTER NAVIGATION;  Surgeon: Rod Can, MD;  Location: [facility];  Service: Orthopedics;  Laterality: Left;  Needs RNFA  . PENILE PROSTHESIS IMPLANT    . REPLACEMENT TOTAL KNEE  06/12/2013  . spinal cyst    . THORACOTOMY     left anterior; wound exploration and  debridement  . TONSILLECTOMY AND ADENOIDECTOMY     age 12    reports that he quit smoking about 36 years ago. His smoking use included cigarettes. He has a 68.00 pack-year smoking history. He has never used smokeless tobacco. He reports that he drinks alcohol. He reports that he does not use drugs. family history includes Colon cancer in his maternal grandfather; Heart disease in his brother; Lung cancer in his father; Stomach cancer in his mother. Allergies  Allergen Reactions  . Ace Inhibitors Cough  . Rosuvastatin Other (See Comments)    Muscle pains  . Statins Other (See Comments)    Muscle pains   Current Outpatient Medications on File Prior to Visit  Medication Sig Dispense Refill  . Ascorbic  Acid (VITAMIN C PO) Take 1 tablet by mouth daily.    Marland Kitchen aspirin 325 MG tablet aspirin 325 mg tablet  Take 1 tablet every day by oral route.    . Cholecalciferol (VITAMIN D PO) Take 1 capsule by mouth daily.    . meloxicam (MOBIC) 15 MG tablet Take 15 mg by mouth daily.    . metoprolol succinate (TOPROL-XL) 50 MG 24 hr tablet TAKE 1 TABLET EVERY DAY 90 tablet 1  . rosuvastatin (CRESTOR) 5 MG tablet TAKE 1 TABLET EVERY OTHER DAY 45 tablet 3  . valsartan (DIOVAN) 320 MG tablet Take 1 tablet (320 mg total) by mouth daily. 90 tablet 3   No current facility-administered medications on file prior to visit.    Review of Systems  Constitutional: Negative for other unusual diaphoresis, sweats, appetite or weight changes HENT: Negative for other worsening hearing loss, ear pain, facial swelling, mouth sores or neck stiffness.   Eyes: Negative for other worsening pain, redness or other visual disturbance.  Respiratory: Negative for other stridor or swelling Cardiovascular: Negative for other palpitations or other chest pain  Gastrointestinal: Negative for worsening diarrhea or loose stools, blood in stool, distention or other pain Genitourinary: Negative for hematuria, flank pain or other change in urine volume.  Musculoskeletal: Negative for myalgias or other joint swelling.  Skin: Negative for other color change, or other wound or worsening drainage.  Neurological: Negative for other syncope or numbness. Hematological: Negative for other adenopathy or swelling Psychiatric/Behavioral: Negative for hallucinations, other worsening agitation, SI, self-injury, or new decreased concentration \\All  other system neg per pt    Objective:   Physical Exam BP 118/78   Pulse 62   Temp 98.4 F (36.9 C) (Oral)   Ht 5\' 10"  (1.778 m)   Wt 228 lb (103.4 kg)   SpO2 96%   BMI 32.71 kg/m  VS noted,  Constitutional: Pt is oriented to person, place, and time. Appears well-developed and well-nourished, in no  significant distress and comfortable Head: Normocephalic and atraumatic  Eyes: Conjunctivae and EOM are normal. Pupils are equal, round, and reactive to light Right Ear: External ear normal without discharge Left Ear: External ear normal without discharge Nose: Nose without discharge or deformity Mouth/Throat: Oropharynx is without other ulcerations and moist  Neck: Normal range of motion. Neck supple. No JVD present. No tracheal deviation present or significant neck LA or mass Cardiovascular: Normal rate, regular rhythm, normal heart sounds and intact distal pulses.   Pulmonary/Chest: WOB normal and breath sounds without rales or wheezing  Abdominal: Soft. Bowel sounds are normal. NT. No HSM  Musculoskeletal: Normal range of motion. Exhibits no edema Lymphadenopathy: Has no other cervical adenopathy.  Neurological: Pt is alert and oriented to person, place,  and time. Pt has normal reflexes. No cranial nerve deficit. Motor grossly intact, Gait intact Skin: Skin is warm and dry. No rash noted or new ulcerations Psychiatric:  Has normal mood and affect. Behavior is normal without agitation Right ankle with effusion 1+ and degenerative bony change No other exam findings Lab Results  Component Value Date   WBC 5.8 03/07/2018   HGB 14.6 03/07/2018   HCT 42.7 03/07/2018   PLT 143.0 (L) 03/07/2018   GLUCOSE 111 (H) 03/07/2018   CHOL 196 03/07/2018   TRIG 159.0 (H) 03/07/2018   HDL 41.10 03/07/2018   LDLDIRECT 124.7 05/08/2014   LDLCALC 123 (H) 03/07/2018   ALT 14 03/07/2018   AST 14 03/07/2018   NA 140 03/07/2018   K 4.9 03/07/2018   CL 102 03/07/2018   CREATININE 1.07 03/07/2018   BUN 22 03/07/2018   CO2 29 03/07/2018   TSH 3.35 03/07/2018   PSA 1.28 03/07/2018       Assessment & Plan:

## 2018-03-07 NOTE — Assessment & Plan Note (Signed)
Right ankle with djd , d/w pt, cont pain control

## 2018-03-07 NOTE — Assessment & Plan Note (Signed)
stable overall by history and exam, recent data reviewed with pt, and pt to continue medical treatment as before,  to f/u any worsening symptoms or concerns BP Readings from Last 3 Encounters:  03/07/18 118/78  11/16/17 128/74  08/06/17 105/67

## 2018-03-07 NOTE — Assessment & Plan Note (Addendum)
For sports med f/u encouraged  In addition to the time spent performing CPE, I spent an additional 25 minutes face to face,in which greater than 50% of this time was spent in counseling and coordination of care for patient's acute illness as documented, including the differential dx, treatment, further evaluation and other management of left knee and right ankle djd with pain, penile prosthesis nonfunctioning, hyperglycemia, HTN

## 2018-03-07 NOTE — Assessment & Plan Note (Signed)

## 2018-03-07 NOTE — Patient Instructions (Signed)
Please continue all other medications as before, and refills have been done if requested.  Please have the pharmacy call with any other refills you may need.  Please continue your efforts at being more active, low cholesterol diet, and weight control.  You are otherwise up to date with prevention measures today.  Please keep your appointments with your specialists as you may have planned  You will be contacted regarding the referral for: Urology  Please go to the LAB in the Basement (turn left off the elevator) for the tests to be done today  You will be contacted by phone if any changes need to be made immediately.  Otherwise, you will receive a letter about your results with an explanation, but please check with MyChart first.  Please remember to sign up for MyChart if you have not done so, as this will be important to you in the future with finding out test results, communicating by private email, and scheduling acute appointments online when needed.  Please return in 1 year for your yearly visit, or sooner if needed, with Lab testing done 3-5 days before  [facility] to cancel the October appt

## 2018-03-07 NOTE — Assessment & Plan Note (Signed)
Also for urology referral for nonfunctioning penile prosthesis

## 2018-03-09 ENCOUNTER — Encounter: Payer: Self-pay | Admitting: Internal Medicine

## 2018-03-14 ENCOUNTER — Other Ambulatory Visit: Payer: Self-pay | Admitting: Cardiology

## 2018-04-12 ENCOUNTER — Other Ambulatory Visit: Payer: Self-pay | Admitting: Cardiology

## 2018-04-12 DIAGNOSIS — I2510 Atherosclerotic heart disease of native coronary artery without angina pectoris: Secondary | ICD-10-CM

## 2018-05-03 ENCOUNTER — Ambulatory Visit: Payer: Medicare HMO | Admitting: Urology

## 2018-05-03 DIAGNOSIS — T83490A Other mechanical complication of penile (implanted) prosthesis, initial encounter: Secondary | ICD-10-CM

## 2018-06-28 ENCOUNTER — Encounter: Payer: Medicare HMO | Admitting: Internal Medicine

## 2018-09-05 DIAGNOSIS — H52203 Unspecified astigmatism, bilateral: Secondary | ICD-10-CM | POA: Diagnosis not present

## 2018-09-05 DIAGNOSIS — H524 Presbyopia: Secondary | ICD-10-CM | POA: Diagnosis not present

## 2018-09-05 DIAGNOSIS — H2513 Age-related nuclear cataract, bilateral: Secondary | ICD-10-CM | POA: Diagnosis not present

## 2018-09-05 DIAGNOSIS — H5203 Hypermetropia, bilateral: Secondary | ICD-10-CM | POA: Diagnosis not present

## 2018-11-01 ENCOUNTER — Other Ambulatory Visit: Payer: Self-pay | Admitting: Cardiology

## 2018-12-13 ENCOUNTER — Telehealth: Payer: Self-pay | Admitting: Cardiology

## 2018-12-14 ENCOUNTER — Ambulatory Visit: Payer: Medicare HMO | Admitting: Cardiology

## 2018-12-14 ENCOUNTER — Telehealth (INDEPENDENT_AMBULATORY_CARE_PROVIDER_SITE_OTHER): Payer: Medicare HMO | Admitting: Cardiology

## 2018-12-14 VITALS — BP 128/78 | HR 42 | Temp 95.7°F | Ht 70.0 in | Wt 228.0 lb

## 2018-12-14 DIAGNOSIS — I2510 Atherosclerotic heart disease of native coronary artery without angina pectoris: Secondary | ICD-10-CM | POA: Diagnosis not present

## 2018-12-14 DIAGNOSIS — I255 Ischemic cardiomyopathy: Secondary | ICD-10-CM

## 2018-12-14 MED ORDER — FUROSEMIDE 20 MG PO TABS: 20.0000 mg | ORAL_TABLET | ORAL | 3 refills | Status: DC

## 2018-12-14 MED ORDER — METOPROLOL SUCCINATE ER 25 MG PO TB24: 25.0000 mg | ORAL_TABLET | Freq: Every day | ORAL | 3 refills | Status: DC

## 2018-12-14 MED ORDER — ASPIRIN 81 MG PO TABS: 81.0000 mg | ORAL_TABLET | Freq: Every day | ORAL | Status: DC

## 2018-12-14 NOTE — Addendum Note (Signed)
Addended by: Cristopher Estimable on: 12/14/2018 11:23 AM   Modules accepted: Orders

## 2018-12-14 NOTE — Patient Instructions (Signed)
Medication Instructions:  DECREASE ASPIRIN TO 81 MG ONCE DAILY  DECREASE METOPROLOL TO 25 MG ONCE DAILY= 1/2 OF THE 50 MG TABLET ONCE DAILY  START FUROSEMIDE 20 MG ONE TABLET EVERY OTHER DAY If you need a refill on your cardiac medications before your next appointment, please call your pharmacy.   Lab work: Your physician recommends that you return for lab work in: [place] If you have labs (blood work) drawn today and your tests are completely normal, you will receive your results only by: Marland Kitchen MyChart Message (if you have MyChart) OR . A paper copy in the mail If you have any lab test that is abnormal or we need to change your treatment, we will call you to review the results.  Testing/Procedures: Your physician has requested that you have an echocardiogram. Echocardiography is a painless test that uses sound waves to create images of your heart. It provides your doctor with information about the size and shape of your heart and how well your heart's chambers and valves are working. This procedure takes approximately one hour. There are no restrictions for this procedure.  [place]  Follow-Up: At [facility], you and your health needs are our priority.  As part of our continuing mission to provide you with exceptional heart care, we have created designated Provider Care Teams.  These Care Teams include your primary Cardiologist (physician) and Advanced Practice Providers (APPs -  Physician Assistants and Nurse Practitioners) who all work together to provide you with the care you need, when you need it. Your physician recommends that you schedule a follow-up appointment in: [place]

## 2018-12-14 NOTE — Progress Notes (Signed)
Virtual Visit via Video Note changed to phone visit as patient having problems with technology.   This visit type was conducted due to national recommendations for restrictions regarding the COVID-19 Pandemic (e.g. social distancing) in an effort to limit this patient's exposure and mitigate transmission in our community.  Due to his co-morbid illnesses, this patient is at least at moderate risk for complications without adequate follow up.  This format is felt to be most appropriate for this patient at this time.  All issues noted in this document were discussed and addressed.  A limited physical exam was performed with this format.  Please refer to the patient's chart for his consent to telehealth for [facility].   Date:  12/14/2018   ID:  [name] [name], DOB 1946-05-10, MRN [id number]  Patient Location: Home Provider Location: Home  PCP:  Biagio Borg, MD  Cardiologist:  Dr [name]  Evaluation Performed:  Follow-Up Visit  Chief Complaint:  FU CAD  History of Present Illness:    FU CAD; history dates back to 1998 when he had a myocardial infarction. He subsequently underwent minimally invasive LIMA to his LAD. Echo May 2015 was technically difficult. Ejection fraction 40-45% and thrombus cannot be excluded. Grade 1 diastolic dysfunction. Mild to moderate left atrial enlargement. Trace mitral and tricuspid regurgitation. Echo repeated in June 2015 and showed no thrombus. Nuclear study May 2016 Showed ejection fraction 40%, prior anterior apical infarct but no ischemia. Since I last saw him,over the past 7 months he notes increased dyspnea on exertion but no orthopnea or PND.  Minimal pedal edema.  No chest pain or syncope.  The patient does not have symptoms concerning for COVID-19 infection (fever, chills, cough, or new shortness of breath).    Past Medical History:  Diagnosis Date  . ALLERGIC RHINITIS 10/31/2007   Qualifier: Diagnosis of  By: Jenny Reichmann MD, [place] Oris   . Allergy    . CHOLECYSTECTOMY, HX OF 06/04/2007   Qualifier: Diagnosis of  By: Danny Lawless CMA, [place]    . COLONIC POLYPS, HX OF 10/31/2007   Qualifier: Diagnosis of  By: Jenny Reichmann MD, [place], HX OF 06/04/2007   Qualifier: Diagnosis of  By: [place], [place]    . CORONARY ARTERY DISEASE 06/04/2007   Qualifier: Diagnosis of  By: [place], [place]    . Diverticulosis   . Erectile dysfunction   . ERECTILE DYSFUNCTION 06/04/2007   Qualifier: History of  By: Danny Lawless CMA, [place]    . Eye twitch    right eye since chilhood   . HYPERLIPIDEMIA 06/04/2007   Qualifier: Diagnosis of  By: Danny Lawless CMA, [place]    . Hypersomnolence   . HYPERTENSION 06/04/2007   Qualifier: Diagnosis of  By: [place], [place]    . Hypothyroidism   . ISCHEMIC CARDIOMYOPATHY 06/04/2007   Qualifier: Diagnosis of  By: Danny Lawless CMA, [place]    . Myocardial infarction [facility])    per patient , his occurred in 1998   . NICM (nonischemic cardiomyopathy) ([place])   . OSA (obstructive sleep apnea)   . Osteoarthritis   . PLMD (periodic limb movement disorder)   . Sleep apnea    no cpap' per patient , "i was checked for it and they said i didnt have it "   . Vertigo   . Vitamin D deficiency    Past Surgical History:  Procedure Laterality Date  . CHOLECYSTECTOMY    . COLONOSCOPY    .  CORONARY ARTERY BYPASS GRAFT    . KNEE ARTHROPLASTY Left 08/05/2017   Procedure: LEFT TOTAL KNEE ARTHROPLASTY WITH COMPUTER NAVIGATION;  Surgeon: Rod Can, MD;  Location: [facility];  Service: Orthopedics;  Laterality: Left;  Needs RNFA  . PENILE PROSTHESIS IMPLANT    . REPLACEMENT TOTAL KNEE  06/12/2013  . spinal cyst    . THORACOTOMY     left anterior; wound exploration and debridement  . TONSILLECTOMY AND ADENOIDECTOMY     age 12     Current Meds  Medication Sig  . Ascorbic Acid (VITAMIN C PO) Take 1 tablet by mouth daily.  Marland Kitchen aspirin 325 MG tablet aspirin 325 mg tablet  Take 1 tablet every day by oral route.  .  Cholecalciferol (VITAMIN D PO) Take 1 capsule by mouth daily.  . meloxicam (MOBIC) 15 MG tablet Take 15 mg by mouth daily as needed for pain.   . metoprolol succinate (TOPROL-XL) 50 MG 24 hr tablet TAKE 1 TABLET EVERY DAY  . rosuvastatin (CRESTOR) 5 MG tablet TAKE 1 TABLET EVERY OTHER DAY  . valsartan (DIOVAN) 320 MG tablet TAKE 1 TABLET EVERY DAY     Allergies:   Ace inhibitors; Rosuvastatin; and Statins   Social History   Tobacco Use  . Smoking status: Former Smoker    Packs/day: 4.00    Years: 17.00    Pack years: 68.00    Types: Cigarettes    Last attempt to quit: 05/07/1981    Years since quitting: 37.6  . Smokeless tobacco: Never Used  . Tobacco comment: Pt states that he would let most of them "burn" pt states that he used anywhere between 4-5PPD  Substance Use Topics  . Alcohol use: Yes    Alcohol/week: 0.0 standard drinks    Comment: rare  . Drug use: No     Family Hx: The patient's family history includes Colon cancer in his maternal grandfather; Heart disease in his brother; Lung cancer in his father; Stomach cancer in his mother. There is no history of Esophageal cancer, Pancreatic cancer, Prostate cancer, or Rectal cancer.  ROS:   Please see the history of present illness.    No fevers, chills or productive cough. All other systems reviewed and are negative.  Recent Labs: 03/07/2018: ALT 14; BUN 22; Creatinine, Ser 1.07; Hemoglobin 14.6; Platelets 143.0; Potassium 4.9; Sodium 140; TSH 3.35   Recent Lipid Panel Lab Results  Component Value Date/Time   CHOL 196 03/07/2018 11:15 AM   CHOL 180 03/08/2017 10:44 AM   TRIG 159.0 (H) 03/07/2018 11:15 AM   TRIG 85 06/21/2006 02:15 PM   HDL 41.10 03/07/2018 11:15 AM   HDL 40 03/08/2017 10:44 AM   CHOLHDL 5 03/07/2018 11:15 AM   LDLCALC 123 (H) 03/07/2018 11:15 AM   LDLCALC 109 (H) 03/08/2017 10:44 AM   LDLDIRECT 124.7 05/08/2014 03:17 PM    Wt Readings from Last 3 Encounters:  12/14/18 228 lb (103.4 kg)   03/07/18 228 lb (103.4 kg)  11/16/17 231 lb (104.8 kg)     Objective:    Vital Signs:  BP 128/78   Pulse (!) 42   Temp (!) 95.7 F (35.4 C)   Ht 5\' 10"  (1.778 m)   Wt 228 lb (103.4 kg)   SpO2 96%   BMI 32.71 kg/m    VITAL SIGNS:  reviewed  No acute distress Normal affect Answers questions appropriately Remainder of physical exam not performed (telehealth visit; coronavirus pandemic)  ASSESSMENT & PLAN:  1. Coronary artery disease status post coronary artery bypass and graft-patient continues to do well from a symptomatic standpoint with no chest pain.  Plan to continue medical therapy with aspirin (change to 81 mg daily) and statin. 2. Hypertension-patient's blood pressure is controlled today.  However his heart rate is 42.  Decrease Toprol to 25 mg daily.  Follow heart rate and adjust regimen as needed.  We will plan electrocardiogram when I see him back in the office in 6 to 8 weeks.   3. Hyperlipidemia-continue Crestor at present dose.  He did not tolerate higher doses previously.  Check lipids and liver.  If LDL greater than 70 would consider Repatha. 4. Ischemic cardiomyopathy-continue ARB and beta-blocker.  Patient is describing some dyspnea on exertion.  He also describes minimal pedal edema.  I will add Lasix 20 mg every other day.  In 1 week check potassium and renal function.  Repeat echocardiogram.  COVID-19 Education: The importance of social distancing was discussed today.  Time:   Today, I have spent 15 minutes with the patient with telehealth technology discussing the above problems.     Medication Adjustments/Labs and Tests Ordered: Current medicines are reviewed at length with the patient today.  Concerns regarding medicines are outlined above.   Tests Ordered: No orders of the defined types were placed in this encounter.   Medication Changes: No orders of the defined types were placed in this encounter.   Disposition:  Follow up in 6 week(s)   Signed, Kirk Ruths, MD  12/14/2018 11:05 AM    [place]

## 2018-12-15 ENCOUNTER — Other Ambulatory Visit: Payer: Self-pay | Admitting: Cardiology

## 2018-12-15 NOTE — Telephone Encounter (Signed)
Rosuvastatin 5 mg refilled. 

## 2018-12-19 ENCOUNTER — Other Ambulatory Visit (HOSPITAL_COMMUNITY)
Admission: RE | Admit: 2018-12-19 | Discharge: 2018-12-19 | Disposition: A | Discharge status: Home / Self Care | Payer: Medicare HMO | Source: Ambulatory Visit | Attending: Cardiology | Admitting: Cardiology

## 2018-12-19 ENCOUNTER — Other Ambulatory Visit: Payer: Self-pay

## 2018-12-19 DIAGNOSIS — I2510 Atherosclerotic heart disease of native coronary artery without angina pectoris: Secondary | ICD-10-CM | POA: Diagnosis not present

## 2018-12-19 DIAGNOSIS — I255 Ischemic cardiomyopathy: Secondary | ICD-10-CM | POA: Insufficient documentation

## 2018-12-19 LAB — LIPID PANEL
Cholesterol: 168 mg/dL (ref 0–200)
HDL: 33 mg/dL — ABNORMAL LOW (ref >40)
LDL Cholesterol: 97 mg/dL (ref 0–99)
Total CHOL/HDL Ratio: 5.1 RATIO
Triglycerides: 189 mg/dL — ABNORMAL HIGH (ref <150)
VLDL: 38 mg/dL (ref 0–40)

## 2018-12-19 LAB — COMPREHENSIVE METABOLIC PANEL
ALT: 17 U/L (ref 0–44)
AST: 17 U/L (ref 15–41)
Albumin: 4 g/dL (ref 3.5–5.0)
Alkaline Phosphatase: 51 U/L (ref 38–126)
Anion gap: 10 (ref 5–15)
BUN: 23 mg/dL (ref 8–23)
CO2: 26 mmol/L (ref 22–32)
Calcium: 9.2 mg/dL (ref 8.9–10.3)
Chloride: 103 mmol/L (ref 98–111)
Creatinine, Ser: 1.21 mg/dL (ref 0.61–1.24)
GFR calc Af Amer: >60 mL/min (ref >60)
GFR calc non Af Amer: 59 mL/min — ABNORMAL LOW (ref >60)
Glucose, Bld: 134 mg/dL — ABNORMAL HIGH (ref 70–99)
Potassium: 4.5 mmol/L (ref 3.5–5.1)
Sodium: 139 mmol/L (ref 135–145)
Total Bilirubin: 1.2 mg/dL (ref 0.3–1.2)
Total Protein: 7 g/dL (ref 6.5–8.1)

## 2018-12-21 ENCOUNTER — Telehealth: Payer: Self-pay | Admitting: *Deleted

## 2018-12-21 DIAGNOSIS — E7800 Pure hypercholesterolemia, unspecified: Secondary | ICD-10-CM

## 2018-12-21 MED ORDER — EZETIMIBE 10 MG PO TABS: 10.0000 mg | ORAL_TABLET | Freq: Every day | ORAL | 3 refills | Status: DC

## 2018-12-21 NOTE — Telephone Encounter (Signed)
-----   Message from [name], MD sent at 12/19/2018  9:52 AM EDT ----- Add Zetia 10 mg daily.  Check lipids and liver in 8 weeks. [name] [name]

## 2018-12-21 NOTE — Telephone Encounter (Signed)
Message to patient via my chart with dr Jacalyn Lefevre recommendations. New script sent to the pharmacy and Lab orders mailed to the pt

## 2019-01-10 ENCOUNTER — Telehealth (HOSPITAL_COMMUNITY): Payer: Self-pay | Admitting: Cardiology

## 2019-01-10 NOTE — Telephone Encounter (Signed)
Left message

## 2019-01-11 ENCOUNTER — Ambulatory Visit (HOSPITAL_COMMUNITY): Payer: Medicare HMO | Attending: Cardiovascular Disease

## 2019-01-11 ENCOUNTER — Other Ambulatory Visit: Payer: Self-pay

## 2019-01-11 DIAGNOSIS — I255 Ischemic cardiomyopathy: Secondary | ICD-10-CM | POA: Insufficient documentation

## 2019-01-11 MED ORDER — PERFLUTREN LIPID MICROSPHERE
1.0000 mL | INTRAVENOUS | Status: AC | PRN
  Administered 2019-01-11: 3 mL via INTRAVENOUS

## 2019-01-19 ENCOUNTER — Other Ambulatory Visit: Payer: Self-pay | Admitting: *Deleted

## 2019-01-19 DIAGNOSIS — R931 Abnormal findings on diagnostic imaging of heart and coronary circulation: Secondary | ICD-10-CM

## 2019-01-27 ENCOUNTER — Encounter: Payer: Self-pay | Admitting: Cardiology

## 2019-01-30 NOTE — Progress Notes (Signed)
HPI: FU CAD; history dates back to 1998 when he had a myocardial infarction. He subsequently underwent minimally invasive LIMA to his LAD. Echo May 2015 was technically difficult. Ejection fraction 40-45% and thrombus cannot be excluded. Grade 1 diastolic dysfunction. Mild to moderate left atrial enlargement. Trace mitral and tricuspid regurgitation. Echo repeated in June 2015 and showed no thrombus. Nuclear study May 2016 Showed ejection fraction 40%, prior anterior apical infarct but no ischemia.  Echocardiogram June 2020 showed ejection fraction 30 to 35% and possible LV apical thrombus though not definitive.  Cardiac MRI recommended.  There was mild left atrial enlargement, mild mitral regurgitation, trace aortic insufficiency.  Since I last saw him,he has some dyspnea on exertion but no orthopnea or PND.  Minimal pedal edema.  There is no chest pain or syncope.  No history of bleeding.  Current Outpatient Medications  Medication Sig Dispense Refill   Ascorbic Acid (VITAMIN C PO) Take 1 tablet by mouth daily.     aspirin 81 MG tablet Take 1 tablet (81 mg total) by mouth daily.     Cholecalciferol (VITAMIN D PO) Take 1 capsule by mouth daily.     ezetimibe (ZETIA) 10 MG tablet Take 1 tablet (10 mg total) by mouth daily. 90 tablet 3   furosemide (LASIX) 20 MG tablet Take 1 tablet (20 mg total) by mouth every other day. 45 tablet 3   meloxicam (MOBIC) 15 MG tablet Take 15 mg by mouth daily as needed for pain.      metoprolol succinate (TOPROL-XL) 25 MG 24 hr tablet Take 1 tablet (25 mg total) by mouth daily. Take with or immediately following a meal. 90 tablet 3   rosuvastatin (CRESTOR) 5 MG tablet TAKE 1 TABLET EVERY OTHER DAY 45 tablet 3   valsartan (DIOVAN) 320 MG tablet TAKE 1 TABLET EVERY DAY 90 tablet 2   No current facility-administered medications for this visit.      Past Medical History:  Diagnosis Date   ALLERGIC RHINITIS 10/31/2007   Qualifier: Diagnosis of  By:  Jenny Reichmann MD, Hunt Oris    Allergy    CHOLECYSTECTOMY, HX OF 06/04/2007   Qualifier: Diagnosis of  By: Danny Lawless [place], [place]     COLONIC POLYPS, HX OF 10/31/2007   Qualifier: Diagnosis of  By: Jenny Reichmann MD, [place] ARTERY BYPASS GRAFT, HX OF 06/04/2007   Qualifier: Diagnosis of  By: [place], [place]     CORONARY ARTERY DISEASE 06/04/2007   Qualifier: Diagnosis of  By: Danny Lawless CMA, [place]     Diverticulosis    Erectile dysfunction    ERECTILE DYSFUNCTION 06/04/2007   Qualifier: History of  By: [place], [place]     Eye twitch    right eye since chilhood    HYPERLIPIDEMIA 06/04/2007   Qualifier: Diagnosis of  By: [place], [place]     Hypersomnolence    HYPERTENSION 06/04/2007   Qualifier: Diagnosis of  By: [place], [place]     Hypothyroidism    ISCHEMIC CARDIOMYOPATHY 06/04/2007   Qualifier: Diagnosis of  By: Danny Lawless CMA, [place]     Myocardial infarction ([place])    per patient , his occurred in 1998    NICM (nonischemic cardiomyopathy) ([place])    OSA (obstructive sleep apnea)    Osteoarthritis    PLMD (periodic limb movement disorder)    Sleep apnea    no cpap' per patient , "i was checked for it and they said i didnt  have it "    Vertigo    Vitamin D deficiency     Past Surgical History:  Procedure Laterality Date   CHOLECYSTECTOMY     COLONOSCOPY     CORONARY ARTERY BYPASS GRAFT     KNEE ARTHROPLASTY Left 08/05/2017   Procedure: LEFT TOTAL KNEE ARTHROPLASTY WITH COMPUTER NAVIGATION;  Surgeon: Rod Can, MD;  Location: [facility];  Service: Orthopedics;  Laterality: Left;  Needs RNFA   PENILE PROSTHESIS IMPLANT     REPLACEMENT TOTAL KNEE  06/12/2013   spinal cyst     THORACOTOMY     left anterior; wound exploration and debridement   TONSILLECTOMY AND ADENOIDECTOMY     age 28    Social History   Socioeconomic History   Marital status: Married    Spouse name: Not on file   Number of children: 3   Years of education: Not on  file   Highest education level: Not on file  Occupational History   Occupation: truck Education administrator: [employer] resource strain: Not on file   Food insecurity    Worry: Not on file    Inability: Not on file   Transportation needs    Medical: Not on file    Non-medical: Not on file  Tobacco Use   Smoking status: Former Smoker    Packs/day: 4.00    Years: 17.00    Pack years: 68.00    Types: Cigarettes    Quit date: 05/07/1981    Years since quitting: 37.7   Smokeless tobacco: Never Used   Tobacco comment: Pt states that he would let most of them "burn" pt states that he used anywhere between 4-5PPD  Substance and Sexual Activity   Alcohol use: Yes    Alcohol/week: 0.0 standard drinks    Comment: rare   Drug use: No   Sexual activity: Not on file  Lifestyle   Physical activity    Days per week: Not on file    Minutes per session: Not on file   Stress: Not on file  Relationships   Social connections    Talks on phone: Not on file    Gets together: Not on file    Attends religious service: Not on file    Active member of club or organization: Not on file    Attends meetings of clubs or organizations: Not on file    Relationship status: Not on file   Intimate partner violence    Fear of current or ex partner: Not on file    Emotionally abused: Not on file    Physically abused: Not on file    Forced sexual activity: Not on file  Other Topics Concern   Not on file  Social History Narrative   Not on file    Family History  Problem Relation Age of Onset   Stomach cancer Mother    Lung cancer Father    Heart disease Brother        first MI at 90yo, now 60 for transplant list/ ICM   Colon cancer Maternal Grandfather    Esophageal cancer Neg Hx    Pancreatic cancer Neg Hx    Prostate cancer Neg Hx    Rectal cancer Neg Hx     ROS: no fevers or chills, productive cough, hemoptysis, dysphasia, odynophagia,  melena, hematochezia, dysuria, hematuria, rash, seizure activity, orthopnea, PND, pedal edema, claudication. Remaining systems are negative.  Physical Exam: Well-developed well-nourished  in no acute distress.  Skin is warm and dry.  HEENT is normal.  Neck is supple.  Chest is clear to auscultation with normal expansion.  Cardiovascular exam is regular rate and rhythm.  Abdominal exam nontender or distended. No masses palpated. Extremities show no edema. neuro grossly intact  ECG-sinus rhythm at a rate of 71, left axis deviation, prior anterior infarct, PVCs.  Personally reviewed  A/P  1 coronary artery disease status post coronary artery bypass graft-patient denies chest pain.  Continue medical therapy with statin.  2 ischemic cardiomyopathy-recent echocardiogram suggested worsening LV function with ejection fraction 30 to 35% mildly reduced compared to previous.  There was also question of apical thrombus.  I personally reviewed the patient's echocardiogram and I am also concerned about potential thrombus.  We will plan cardiac MRI both to quantitate LV function and to rule out thrombus.  In the interim I will discontinue aspirin and treat with apixaban 5 mg twice daily.  We can discontinue this if cardiac MRI negative for thrombus and resume aspirin.  If ejection fraction is less than 35% would need to consider ischemia evaluation and possible ICD.  Continue Toprol (dose recently decreased because of fatigue and bradycardia).  Discontinue ARB and begin Entresto 24/26 twice daily. Continue Lasix.  Add Spironolactone 12.5 mg daily.  Check potassium and renal function in 1 week.  We will titrate medications as tolerated by blood pressure.  3 hypertension-patient's blood pressure is controlled.  Plan as outlined above.  4 hyperlipidemia-continue Crestor and zetia.  Note he did not tolerate higher doses of statins previously.  Kirk Ruths, MD

## 2019-01-31 ENCOUNTER — Ambulatory Visit (INDEPENDENT_AMBULATORY_CARE_PROVIDER_SITE_OTHER): Payer: Medicare HMO | Admitting: Cardiology

## 2019-01-31 ENCOUNTER — Encounter: Payer: Self-pay | Admitting: Cardiology

## 2019-01-31 VITALS — BP 116/68 | HR 71 | Temp 98.6°F | Ht 71.0 in | Wt 228.0 lb

## 2019-01-31 DIAGNOSIS — I255 Ischemic cardiomyopathy: Secondary | ICD-10-CM | POA: Diagnosis not present

## 2019-01-31 DIAGNOSIS — E7800 Pure hypercholesterolemia, unspecified: Secondary | ICD-10-CM | POA: Diagnosis not present

## 2019-01-31 DIAGNOSIS — R931 Abnormal findings on diagnostic imaging of heart and coronary circulation: Secondary | ICD-10-CM | POA: Diagnosis not present

## 2019-01-31 DIAGNOSIS — I10 Essential (primary) hypertension: Secondary | ICD-10-CM

## 2019-01-31 DIAGNOSIS — I2510 Atherosclerotic heart disease of native coronary artery without angina pectoris: Secondary | ICD-10-CM | POA: Diagnosis not present

## 2019-01-31 MED ORDER — APIXABAN 5 MG PO TABS: 5.0000 mg | ORAL_TABLET | Freq: Two times a day (BID) | ORAL | 6 refills | Status: DC

## 2019-01-31 MED ORDER — SPIRONOLACTONE 25 MG PO TABS: 12.5000 mg | ORAL_TABLET | Freq: Every day | ORAL | 3 refills | Status: DC

## 2019-01-31 MED ORDER — ENTRESTO 24-26 MG PO TABS: 1.0000 | ORAL_TABLET | Freq: Two times a day (BID) | ORAL | 12 refills | Status: DC

## 2019-01-31 NOTE — Patient Instructions (Signed)
Medication Instructions:  STOP ASPIRIN  START ELIQUIS 5 MG ONE TABLET TWICE DAILY  STOP VALSARTAN  START ENTRESTO 24/26 MG ONE TABLET TWICE DAILY  START SPIRONOLACTONE 12.5 MG ONCE DAILY= 1/2 OF THE 25 MG TABLET ONCE DAILY  If you need a refill on your cardiac medications before your next appointment, please call your pharmacy.   Lab work: Your physician recommends that you return for lab work in: [place] If you have labs (blood work) drawn today and your tests are completely normal, you will receive your results only by: Marland Kitchen MyChart Message (if you have MyChart) OR . A paper copy in the mail If you have any lab test that is abnormal or we need to change your treatment, we will call you to review the results.  Follow-Up: At [facility], you and your health needs are our priority.  As part of our continuing mission to provide you with exceptional heart care, we have created designated Provider Care Teams.  These Care Teams include your primary Cardiologist (physician) and Advanced Practice Providers (APPs -  Physician Assistants and Nurse Practitioners) who all work together to provide you with the care you need, when you need it. Your physician recommends that you schedule a follow-up appointment in: [place]

## 2019-02-02 ENCOUNTER — Telehealth: Payer: Self-pay

## 2019-02-02 NOTE — Telephone Encounter (Signed)
Prior Auth started for Entresto 24-26mg 

## 2019-02-03 ENCOUNTER — Telehealth: Payer: Self-pay

## 2019-02-03 NOTE — Telephone Encounter (Signed)
Prior Auth sent KEY: [id number] Diagnosis: Ischemic Cardiomyopathy

## 2019-02-06 ENCOUNTER — Telehealth: Payer: Self-pay

## 2019-02-06 NOTE — Telephone Encounter (Signed)
Opened in error

## 2019-02-06 NOTE — Telephone Encounter (Signed)
Rx [name] [name] was approved until 08/10/2019 through patient part D benefit.

## 2019-02-08 ENCOUNTER — Other Ambulatory Visit: Payer: Self-pay

## 2019-02-08 ENCOUNTER — Other Ambulatory Visit (HOSPITAL_COMMUNITY)
Admission: RE | Admit: 2019-02-08 | Discharge: 2019-02-08 | Disposition: A | Discharge status: Home / Self Care | Payer: Medicare HMO | Source: Ambulatory Visit | Attending: Cardiology | Admitting: Cardiology

## 2019-02-08 DIAGNOSIS — R931 Abnormal findings on diagnostic imaging of heart and coronary circulation: Secondary | ICD-10-CM | POA: Insufficient documentation

## 2019-02-08 LAB — BASIC METABOLIC PANEL
Anion gap: 9 (ref 5–15)
BUN: 20 mg/dL (ref 8–23)
CO2: 26 mmol/L (ref 22–32)
Calcium: 9.3 mg/dL (ref 8.9–10.3)
Chloride: 103 mmol/L (ref 98–111)
Creatinine, Ser: 1.32 mg/dL — ABNORMAL HIGH (ref 0.61–1.24)
GFR calc Af Amer: >60 mL/min (ref >60)
GFR calc non Af Amer: 53 mL/min — ABNORMAL LOW (ref >60)
Glucose, Bld: 124 mg/dL — ABNORMAL HIGH (ref 70–99)
Potassium: 4.5 mmol/L (ref 3.5–5.1)
Sodium: 138 mmol/L (ref 135–145)

## 2019-02-15 ENCOUNTER — Ambulatory Visit (HOSPITAL_COMMUNITY)
Admission: RE | Admit: 2019-02-15 | Discharge: 2019-02-15 | Disposition: A | Discharge status: Home / Self Care | Payer: Medicare HMO | Source: Ambulatory Visit | Attending: Cardiology | Admitting: Cardiology

## 2019-02-15 ENCOUNTER — Other Ambulatory Visit: Payer: Self-pay

## 2019-02-15 DIAGNOSIS — I513 Intracardiac thrombosis, not elsewhere classified: Secondary | ICD-10-CM

## 2019-02-15 DIAGNOSIS — R931 Abnormal findings on diagnostic imaging of heart and coronary circulation: Secondary | ICD-10-CM | POA: Diagnosis not present

## 2019-02-15 HISTORY — DX: Intracardiac thrombosis, not elsewhere classified: I51.3

## 2019-02-15 MED ORDER — GADOBUTROL 1 MMOL/ML IV SOLN
12.0000 mL | Freq: Once | INTRAVENOUS | Status: AC | PRN
  Administered 2019-02-15: 13:00:00 12 mL via INTRAVENOUS

## 2019-03-10 NOTE — Progress Notes (Signed)
Virtual Visit via Video Note   This visit type was conducted due to national recommendations for restrictions regarding the COVID-19 Pandemic (e.g. social distancing) in an effort to limit this patient's exposure and mitigate transmission in our community.  Due to his co-morbid illnesses, this patient is at least at moderate risk for complications without adequate follow up.  This format is felt to be most appropriate for this patient at this time.  All issues noted in this document were discussed and addressed.  A limited physical exam was performed with this format.  Please refer to the patient's chart for his consent to telehealth for [facility].   Date:  03/15/2019   ID:  [name] [name], DOB Jan 06, 1946, MRN [id number]  Patient Location:Home Provider Location: Home  PCP:  Biagio Borg, MD  Cardiologist:  Dr [name]  Evaluation Performed:  Follow-Up Visit  Chief Complaint:  FU CAD  History of Present Illness:    FU CAD; history dates back to 1998 when he had a myocardial infarction. He subsequently underwent minimally invasive LIMA to his LAD. Echo May 2015 was technically difficult. Ejection fraction 40-45% and thrombus cannot be excluded. Grade 1 diastolic dysfunction. Mild to moderate left atrial enlargement. Trace mitral and tricuspid regurgitation. Echo repeated in June 2015 and showed no thrombus. Nuclear study May 2016 Showed ejection fraction 40%, prior anterior apical infarct but no ischemia.  Echocardiogram June 2020 showed ejection fraction 30 to 35% and possible LV apical thrombus though not definitive.  Cardiac MRI recommended.  There was mild left atrial enlargement, mild mitral regurgitation, trace aortic insufficiency.  Cardiac MRI July 2020 showed ejection fraction approximately 30% and apical thrombus noted.  Mild aortic and mitral regurgitation.  Since I last saw him, the patient has dyspnea with more extreme activities but not with routine activities. It is  relieved with rest. It is not associated with chest pain. There is no orthopnea, PND or pedal edema. There is no syncope or palpitations. There is no exertional chest pain.   The patient does not have symptoms concerning for COVID-19 infection (fever, chills, cough, or new shortness of breath).    Past Medical History:  Diagnosis Date  . ALLERGIC RHINITIS 10/31/2007   Qualifier: Diagnosis of  By: Jenny Reichmann MD, Hunt Oris   . Allergy   . CHOLECYSTECTOMY, HX OF 06/04/2007   Qualifier: Diagnosis of  By: Danny Lawless CMA, [place]    . COLONIC POLYPS, HX OF 10/31/2007   Qualifier: Diagnosis of  By: Jenny Reichmann MD, [place], HX OF 06/04/2007   Qualifier: Diagnosis of  By: [place], [place]    . CORONARY ARTERY DISEASE 06/04/2007   Qualifier: Diagnosis of  By: [place], [place]    . Diverticulosis   . Erectile dysfunction   . ERECTILE DYSFUNCTION 06/04/2007   Qualifier: History of  By: Danny Lawless CMA, [place]    . Eye twitch    right eye since chilhood   . HYPERLIPIDEMIA 06/04/2007   Qualifier: Diagnosis of  By: Danny Lawless CMA, [place]    . Hypersomnolence   . HYPERTENSION 06/04/2007   Qualifier: Diagnosis of  By: [place], [place]    . Hypothyroidism   . ISCHEMIC CARDIOMYOPATHY 06/04/2007   Qualifier: Diagnosis of  By: Danny Lawless CMA, [place]    . Myocardial infarction [facility])    per patient , his occurred in 1998   . NICM (nonischemic cardiomyopathy) ([place])   . OSA (obstructive sleep apnea)   .  Osteoarthritis   . PLMD (periodic limb movement disorder)   . Sleep apnea    no cpap' per patient , "i was checked for it and they said i didnt have it "   . Vertigo   . Vitamin D deficiency    Past Surgical History:  Procedure Laterality Date  . CHOLECYSTECTOMY    . COLONOSCOPY    . CORONARY ARTERY BYPASS GRAFT    . KNEE ARTHROPLASTY Left 08/05/2017   Procedure: LEFT TOTAL KNEE ARTHROPLASTY WITH COMPUTER NAVIGATION;  Surgeon: Rod Can, MD;  Location: [facility];  Service:  Orthopedics;  Laterality: Left;  Needs RNFA  . PENILE PROSTHESIS IMPLANT    . REPLACEMENT TOTAL KNEE  06/12/2013  . spinal cyst    . THORACOTOMY     left anterior; wound exploration and debridement  . TONSILLECTOMY AND ADENOIDECTOMY     age 26     Current Meds  Medication Sig  . apixaban (ELIQUIS) 5 MG TABS tablet Take 1 tablet (5 mg total) by mouth 2 (two) times daily.  . Ascorbic Acid (VITAMIN C PO) Take 1 tablet by mouth daily.  . Cholecalciferol (VITAMIN D PO) Take 1 capsule by mouth daily.  Marland Kitchen ezetimibe (ZETIA) 10 MG tablet Take 1 tablet (10 mg total) by mouth daily.  . meloxicam (MOBIC) 15 MG tablet Take 15 mg by mouth daily as needed for pain.   . metoprolol succinate (TOPROL-XL) 25 MG 24 hr tablet Take 1 tablet (25 mg total) by mouth daily. Take with or immediately following a meal.  . rosuvastatin (CRESTOR) 5 MG tablet TAKE 1 TABLET EVERY OTHER DAY  . sacubitril-valsartan (ENTRESTO) 24-26 MG Take 1 tablet by mouth 2 (two) times daily.  Marland Kitchen spironolactone (ALDACTONE) 25 MG tablet Take 0.5 tablets (12.5 mg total) by mouth daily.     Allergies:   Ace inhibitors, Rosuvastatin, and Statins   Social History   Tobacco Use  . Smoking status: Former Smoker    Packs/day: 4.00    Years: 17.00    Pack years: 68.00    Types: Cigarettes    Quit date: 05/07/1981    Years since quitting: 37.8  . Smokeless tobacco: Never Used  . Tobacco comment: Pt states that he would let most of them "burn" pt states that he used anywhere between 4-5PPD  Substance Use Topics  . Alcohol use: Yes    Alcohol/week: 0.0 standard drinks    Comment: rare  . Drug use: No     Family Hx: The patient's family history includes Colon cancer in his maternal grandfather; Heart disease in his brother; Lung cancer in his father; Stomach cancer in his mother. There is no history of Esophageal cancer, Pancreatic cancer, Prostate cancer, or Rectal cancer.  ROS:   Please see the history of present illness.    No  Fever, chills  or productive cough All other systems reviewed and are negative.   Recent Labs: 12/19/2018: ALT 17 02/08/2019: BUN 20; Creatinine, Ser 1.32; Potassium 4.5; Sodium 138   Recent Lipid Panel Lab Results  Component Value Date/Time   CHOL 168 12/19/2018 08:40 AM   CHOL 180 03/08/2017 10:44 AM   TRIG 189 (H) 12/19/2018 08:40 AM   TRIG 85 06/21/2006 02:15 PM   HDL 33 (L) 12/19/2018 08:40 AM   HDL 40 03/08/2017 10:44 AM   CHOLHDL 5.1 12/19/2018 08:40 AM   LDLCALC 97 12/19/2018 08:40 AM   LDLCALC 109 (H) 03/08/2017 10:44 AM   LDLDIRECT 124.7 05/08/2014 03:17 PM  Wt Readings from Last 3 Encounters:  03/15/19 228 lb (103.4 kg)  01/31/19 228 lb (103.4 kg)  12/14/18 228 lb (103.4 kg)     Objective:    Vital Signs:  Pulse 63   Temp (!) 96.7 F (35.9 C)   Ht 5\' 11"  (1.803 m)   Wt 228 lb (103.4 kg)   SpO2 96%   BMI 31.80 kg/m    VITAL SIGNS:  reviewed NAD Answers questions appropriately Normal affect Remainder of physical examination not performed (telehealth visit; coronavirus pandemic)  ASSESSMENT & PLAN:    1. Ischemic cardiomyopathy-previous echocardiogram showed ejection fraction 30 to 35% and possible apical thrombus.  Continue present dose of beta-blocker.  Continue Entresto at present dose.  We will also continue Lasix and spironolactone.  We will reassess LV function with echocardiogram in 3 months.  If ejection fraction less than 35% would need to consider ICD. 2. LV apical thrombus-patient was initiated on apixaban previously.  Most recent data suggest Coumadin may be better.  We discussed this today.  We will discontinue apixaban and instead treat with Coumadin with goal INR 2-3.  We will refer to the Coumadin clinic to initiate. 3. hypertension- Continue present medications and follow. 4. Coronary artery disease status post coronary artery bypass and graft-patient denies chest pain.  Plan nuclear study to screen for ischemia given worsening LV function.   Continue statin.  No aspirin given need for anticoagulation. 5. Hyperlipidemia-continue present dose of Crestor and Zetia.  He did not tolerate high-dose statin previously.  COVID-19 Education: The importance of social distancing was discussed today.  Time:   Today, I have spent 18 minutes with the patient with telehealth technology discussing the above problems.     Medication Adjustments/Labs and Tests Ordered: Current medicines are reviewed at length with the patient today.  Concerns regarding medicines are outlined above.   Tests Ordered: No orders of the defined types were placed in this encounter.   Medication Changes: No orders of the defined types were placed in this encounter.   Follow Up:  Virtual Visit or In Person in 4 month(s)  Signed, Kirk Ruths, MD  03/15/2019 11:47 AM    [place]

## 2019-03-15 ENCOUNTER — Telehealth (INDEPENDENT_AMBULATORY_CARE_PROVIDER_SITE_OTHER): Payer: Medicare HMO | Admitting: Cardiology

## 2019-03-15 ENCOUNTER — Other Ambulatory Visit: Payer: Self-pay | Admitting: *Deleted

## 2019-03-15 ENCOUNTER — Other Ambulatory Visit: Payer: Self-pay

## 2019-03-15 VITALS — HR 63 | Temp 96.7°F | Ht 71.0 in | Wt 228.0 lb

## 2019-03-15 DIAGNOSIS — Z955 Presence of coronary angioplasty implant and graft: Secondary | ICD-10-CM | POA: Diagnosis not present

## 2019-03-15 DIAGNOSIS — I2510 Atherosclerotic heart disease of native coronary artery without angina pectoris: Secondary | ICD-10-CM

## 2019-03-15 DIAGNOSIS — I252 Old myocardial infarction: Secondary | ICD-10-CM | POA: Diagnosis not present

## 2019-03-15 DIAGNOSIS — I10 Essential (primary) hypertension: Secondary | ICD-10-CM

## 2019-03-15 DIAGNOSIS — E785 Hyperlipidemia, unspecified: Secondary | ICD-10-CM

## 2019-03-15 DIAGNOSIS — E7800 Pure hypercholesterolemia, unspecified: Secondary | ICD-10-CM

## 2019-03-15 DIAGNOSIS — Z951 Presence of aortocoronary bypass graft: Secondary | ICD-10-CM

## 2019-03-15 DIAGNOSIS — I255 Ischemic cardiomyopathy: Secondary | ICD-10-CM | POA: Diagnosis not present

## 2019-03-15 MED ORDER — ENTRESTO 24-26 MG PO TABS: 1.0000 | ORAL_TABLET | Freq: Two times a day (BID) | ORAL | 3 refills | Status: DC

## 2019-03-15 MED ORDER — FUROSEMIDE 20 MG PO TABS: 20.0000 mg | ORAL_TABLET | ORAL | 3 refills | Status: DC

## 2019-03-15 MED ORDER — ENTRESTO 24-26 MG PO TABS: 1.0000 | ORAL_TABLET | Freq: Two times a day (BID) | ORAL | 12 refills | Status: DC

## 2019-03-15 MED ORDER — WARFARIN SODIUM 5 MG PO TABS: 5.0000 mg | ORAL_TABLET | Freq: Every day | ORAL | 3 refills | Status: DC

## 2019-03-15 MED ORDER — SPIRONOLACTONE 25 MG PO TABS: 12.5000 mg | ORAL_TABLET | Freq: Every day | ORAL | 3 refills | Status: DC

## 2019-03-15 NOTE — Addendum Note (Signed)
Addended by: Cristopher Estimable on: 03/15/2019 12:36 PM   Modules accepted: Orders

## 2019-03-15 NOTE — Patient Instructions (Addendum)
Medication Instructions:  START WARFARIN 5 MG ONCE DAILY-TAKE TOGETHER WITH ELIQUIS FOR 3 DAYS AND THEN STOP THE ELIQUIS AND CONTINUE THE WARFARIN.  If you need a refill on your cardiac medications before your next appointment, please call your pharmacy.   Lab work: Your physician recommends that you return for lab work COUMADIN CHECK 5 [place] OFFICE  If you have labs (blood work) drawn today and your tests are completely normal, you will receive your results only by: Marland Kitchen MyChart Message (if you have MyChart) OR . A paper copy in the mail If you have any lab test that is abnormal or we need to change your treatment, we will call you to review the results.  Testing/Procedures: Your physician has requested that you have an echocardiogram. Echocardiography is a painless test that uses sound waves to create images of your heart. It provides your doctor with information about the size and shape of your heart and how well your heart's chambers and valves are working. This procedure takes approximately one hour. There are no restrictions for this procedure.[place] 3 MONTHS    Follow-Up: At [facility], you and your health needs are our priority.  As part of our continuing mission to provide you with exceptional heart care, we have created designated Provider Care Teams.  These Care Teams include your primary Cardiologist (physician) and Advanced Practice Providers (APPs -  Physician Assistants and Nurse Practitioners) who all work together to provide you with the care you need, when you need it. You will need a follow up appointment in 4 months.  Please call our office 2 months in advance to schedule this appointment.  You may see Kirk Ruths MD or one of the following Advanced Practice Providers on your designated Care Team:   Kerin Ransom, PA-C Roby Lofts, [place] . Sande Rives, PA-C

## 2019-03-16 ENCOUNTER — Encounter: Payer: Self-pay | Admitting: Internal Medicine

## 2019-03-16 NOTE — Telephone Encounter (Signed)
Please put in labs for [facility]

## 2019-03-22 ENCOUNTER — Other Ambulatory Visit (INDEPENDENT_AMBULATORY_CARE_PROVIDER_SITE_OTHER): Payer: Medicare HMO

## 2019-03-22 ENCOUNTER — Encounter: Payer: Self-pay | Admitting: Internal Medicine

## 2019-03-22 ENCOUNTER — Ambulatory Visit (INDEPENDENT_AMBULATORY_CARE_PROVIDER_SITE_OTHER): Payer: Medicare HMO | Admitting: Internal Medicine

## 2019-03-22 ENCOUNTER — Other Ambulatory Visit: Payer: Self-pay

## 2019-03-22 ENCOUNTER — Ambulatory Visit (INDEPENDENT_AMBULATORY_CARE_PROVIDER_SITE_OTHER): Payer: Medicare HMO | Admitting: *Deleted

## 2019-03-22 VITALS — BP 136/84 | HR 68 | Temp 98.1°F | Ht 71.0 in | Wt 219.0 lb

## 2019-03-22 DIAGNOSIS — I513 Intracardiac thrombosis, not elsewhere classified: Secondary | ICD-10-CM | POA: Diagnosis not present

## 2019-03-22 DIAGNOSIS — E559 Vitamin D deficiency, unspecified: Secondary | ICD-10-CM | POA: Diagnosis not present

## 2019-03-22 DIAGNOSIS — R739 Hyperglycemia, unspecified: Secondary | ICD-10-CM

## 2019-03-22 DIAGNOSIS — I10 Essential (primary) hypertension: Secondary | ICD-10-CM | POA: Diagnosis not present

## 2019-03-22 DIAGNOSIS — Z5181 Encounter for therapeutic drug level monitoring: Secondary | ICD-10-CM | POA: Diagnosis not present

## 2019-03-22 DIAGNOSIS — N529 Male erectile dysfunction, unspecified: Secondary | ICD-10-CM | POA: Insufficient documentation

## 2019-03-22 DIAGNOSIS — Z23 Encounter for immunization: Secondary | ICD-10-CM

## 2019-03-22 DIAGNOSIS — E538 Deficiency of other specified B group vitamins: Secondary | ICD-10-CM

## 2019-03-22 DIAGNOSIS — E611 Iron deficiency: Secondary | ICD-10-CM | POA: Diagnosis not present

## 2019-03-22 DIAGNOSIS — Z0001 Encounter for general adult medical examination with abnormal findings: Secondary | ICD-10-CM | POA: Diagnosis not present

## 2019-03-22 DIAGNOSIS — I240 Acute coronary thrombosis not resulting in myocardial infarction: Secondary | ICD-10-CM

## 2019-03-22 DIAGNOSIS — J309 Allergic rhinitis, unspecified: Secondary | ICD-10-CM | POA: Diagnosis not present

## 2019-03-22 LAB — BASIC METABOLIC PANEL
BUN: 17 mg/dL (ref 6–23)
CO2: 31 mEq/L (ref 19–32)
Calcium: 9.7 mg/dL (ref 8.4–10.5)
Chloride: 101 mEq/L (ref 96–112)
Creatinine, Ser: 1.23 mg/dL (ref 0.40–1.50)
GFR: 57.52 mL/min — ABNORMAL LOW (ref >60.00)
Glucose, Bld: 114 mg/dL — ABNORMAL HIGH (ref 70–99)
Potassium: 4.4 mEq/L (ref 3.5–5.1)
Sodium: 140 mEq/L (ref 135–145)

## 2019-03-22 LAB — URINALYSIS, ROUTINE W REFLEX MICROSCOPIC
Bilirubin Urine: NEGATIVE
Hgb urine dipstick: NEGATIVE
Ketones, ur: NEGATIVE
Leukocytes,Ua: NEGATIVE
Nitrite: NEGATIVE
RBC / HPF: NONE SEEN (ref 0–?)
Specific Gravity, Urine: >=1.03 — AB (ref 1.000–1.030)
Total Protein, Urine: NEGATIVE
Urine Glucose: NEGATIVE
Urobilinogen, UA: 0.2 (ref 0.0–1.0)
pH: 5.5 (ref 5.0–8.0)

## 2019-03-22 LAB — IBC PANEL
Iron: 85 ug/dL (ref 42–165)
Saturation Ratios: 23 % (ref 20.0–50.0)
Transferrin: 264 mg/dL (ref 212.0–360.0)

## 2019-03-22 LAB — CBC WITH DIFFERENTIAL/PLATELET
Basophils Absolute: 0.1 10*3/uL (ref 0.0–0.1)
Basophils Relative: 0.9 % (ref 0.0–3.0)
Eosinophils Absolute: 0.1 10*3/uL (ref 0.0–0.7)
Eosinophils Relative: 2 % (ref 0.0–5.0)
HCT: 46.1 % (ref 39.0–52.0)
Hemoglobin: 15.4 g/dL (ref 13.0–17.0)
Lymphocytes Relative: 28.8 % (ref 12.0–46.0)
Lymphs Abs: 2 10*3/uL (ref 0.7–4.0)
MCHC: 33.4 g/dL (ref 30.0–36.0)
MCV: 95 fl (ref 78.0–100.0)
Monocytes Absolute: 0.6 10*3/uL (ref 0.1–1.0)
Monocytes Relative: 8.6 % (ref 3.0–12.0)
Neutro Abs: 4.1 10*3/uL (ref 1.4–7.7)
Neutrophils Relative %: 59.7 % (ref 43.0–77.0)
Platelets: 149 10*3/uL — ABNORMAL LOW (ref 150.0–400.0)
RBC: 4.85 Mil/uL (ref 4.22–5.81)
RDW: 13 % (ref 11.5–15.5)
WBC: 6.9 10*3/uL (ref 4.0–10.5)

## 2019-03-22 LAB — HEPATIC FUNCTION PANEL
ALT: 20 U/L (ref 0–53)
AST: 20 U/L (ref 0–37)
Albumin: 4.6 g/dL (ref 3.5–5.2)
Alkaline Phosphatase: 51 U/L (ref 39–117)
Bilirubin, Direct: 0.2 mg/dL (ref 0.0–0.3)
Total Bilirubin: 0.8 mg/dL (ref 0.2–1.2)
Total Protein: 7.2 g/dL (ref 6.0–8.3)

## 2019-03-22 LAB — HEMOGLOBIN A1C: Hgb A1c MFr Bld: 6.1 % (ref 4.6–6.5)

## 2019-03-22 LAB — VITAMIN B12: Vitamin B-12: 461 pg/mL (ref 211–911)

## 2019-03-22 LAB — PSA: PSA: 0.87 ng/mL (ref 0.10–4.00)

## 2019-03-22 LAB — LIPID PANEL
Cholesterol: 137 mg/dL (ref 0–200)
HDL: 42.7 mg/dL (ref >39.00)
LDL Cholesterol: 65 mg/dL (ref 0–99)
NonHDL: 94.04
Total CHOL/HDL Ratio: 3
Triglycerides: 144 mg/dL (ref 0.0–149.0)
VLDL: 28.8 mg/dL (ref 0.0–40.0)

## 2019-03-22 LAB — TSH: TSH: 4.02 u[IU]/mL (ref 0.35–4.50)

## 2019-03-22 LAB — VITAMIN D 25 HYDROXY (VIT D DEFICIENCY, FRACTURES): VITD: 48.87 ng/mL (ref 30.00–100.00)

## 2019-03-22 LAB — POCT INR: INR: 1.4 — AB (ref 2.0–3.0)

## 2019-03-22 MED ORDER — TRIAMCINOLONE ACETONIDE 55 MCG/ACT NA AERO: 2.0000 | INHALATION_SPRAY | Freq: Every day | NASAL | 3 refills | Status: DC

## 2019-03-22 MED ORDER — SILDENAFIL CITRATE 100 MG PO TABS: 50.0000 mg | ORAL_TABLET | Freq: Every day | ORAL | 3 refills | Status: DC | PRN

## 2019-03-22 NOTE — Patient Instructions (Signed)
You had the Tdap tetanus shot today  Please take all new medication as prescribed - the viagra as needed, and nasacort for allergies  Please continue all other medications as before, and refills have been done if requested.  Please have the pharmacy call with any other refills you may need.  Please continue your efforts at being more active, low cholesterol diet, and weight control.  You are otherwise up to date with prevention measures today.  Please keep your appointments with your specialists as you may have planned  Please go to the LAB in the Basement (turn left off the elevator) for the tests to be done today  You will be contacted by phone if any changes need to be made immediately.  Otherwise, you will receive a letter about your results with an explanation, but please check with MyChart first.  Please remember to sign up for MyChart if you have not done so, as this will be important to you in the future with finding out test results, communicating by private email, and scheduling acute appointments online when needed.  Please return in 6 months, or sooner if needed

## 2019-03-22 NOTE — Patient Instructions (Signed)
Pt here for INR check after transitioning from eliquis to warfarin.  He did not overlap eliquis and warfarin x 3 days as instructed.  Says it caused him to have a headache.  Started warfarin 5mg  on Sunday 03/19/19.  Has had 3 doses Continue warfarin 5mg  daily.  Recheck in 1 week.

## 2019-03-22 NOTE — Progress Notes (Signed)
Subjective:    Patient ID: [name] [name], male    DOB: Jul 30, 1946, 73 y.o.   MRN: [id number]  HPI  Here for wellness and f/u;  Overall doing ok;  Pt denies Chest pain, worsening SOB, DOE, wheezing, orthopnea, PND, worsening LE edema, palpitations, dizziness or syncope.  Pt denies neurological change such as new headache, facial or extremity weakness.  Pt denies polydipsia, polyuria, or low sugar symptoms. Pt states overall good compliance with treatment and medications, good tolerability, and has been trying to follow appropriate diet.  Pt denies worsening depressive symptoms, suicidal ideation or panic. No fever, night sweats, wt loss, loss of appetite, or other constitutional symptoms.  Pt states good ability with ADL's, has low fall risk, home safety reviewed and adequate, no other significant changes in hearing or vision, and only occasionally active with exercise. Due for Tdap Also just started coumadin last Sunday for LV thrombus, EF 30%, has f/u INR for next Tuesday. Lipids followed per cardiology. Also has c/o worsening ED in the past year, just cant seem to maintain to completion, has not tried viagra in the past. Denies urinary symptoms such as dysuria, frequency, urgency, flank pain, hematuria or n/v, fever, chills.   Does have several wks ongoing nasal allergy symptoms with clearish congestion, itch and sneezing, without fever, pain, ST, cough, swelling or wheezing.  Past Medical History:  Diagnosis Date  . ALLERGIC RHINITIS 10/31/2007   Qualifier: Diagnosis of  By: Jenny Reichmann MD, [place] Oris   . Allergy   . CHOLECYSTECTOMY, HX OF 06/04/2007   Qualifier: Diagnosis of  By: Danny Lawless CMA, [place]    . COLONIC POLYPS, HX OF 10/31/2007   Qualifier: Diagnosis of  By: Jenny Reichmann MD, [place], HX OF 06/04/2007   Qualifier: Diagnosis of  By: [place], [place]    . CORONARY ARTERY DISEASE 06/04/2007   Qualifier: Diagnosis of  By: [place], [place]    . Diverticulosis    . Erectile dysfunction   . ERECTILE DYSFUNCTION 06/04/2007   Qualifier: History of  By: Danny Lawless CMA, [place]    . Eye twitch    right eye since chilhood   . HYPERLIPIDEMIA 06/04/2007   Qualifier: Diagnosis of  By: Danny Lawless CMA, [place]    . Hypersomnolence   . HYPERTENSION 06/04/2007   Qualifier: Diagnosis of  By: [place], [place]    . Hypothyroidism   . ISCHEMIC CARDIOMYOPATHY 06/04/2007   Qualifier: Diagnosis of  By: Danny Lawless CMA, [place]    . Myocardial infarction [facility])    per patient , his occurred in 1998   . NICM (nonischemic cardiomyopathy) ([place])   . OSA (obstructive sleep apnea)   . Osteoarthritis   . PLMD (periodic limb movement disorder)   . Sleep apnea    no cpap' per patient , "i was checked for it and they said i didnt have it "   . Vertigo   . Vitamin D deficiency    Past Surgical History:  Procedure Laterality Date  . CHOLECYSTECTOMY    . COLONOSCOPY    . CORONARY ARTERY BYPASS GRAFT    . KNEE ARTHROPLASTY Left 08/05/2017   Procedure: LEFT TOTAL KNEE ARTHROPLASTY WITH COMPUTER NAVIGATION;  Surgeon: Rod Can, MD;  Location: [facility];  Service: Orthopedics;  Laterality: Left;  Needs RNFA  . PENILE PROSTHESIS IMPLANT    . REPLACEMENT TOTAL KNEE  06/12/2013  . spinal cyst    . THORACOTOMY     left anterior;  wound exploration and debridement  . TONSILLECTOMY AND ADENOIDECTOMY     age 73    reports that he quit smoking about 37 years ago. His smoking use included cigarettes. He has a 68.00 pack-year smoking history. He has never used smokeless tobacco. He reports current alcohol use. He reports that he does not use drugs. family history includes Colon cancer in his maternal grandfather; Heart disease in his brother; Lung cancer in his father; Stomach cancer in his mother. Allergies  Allergen Reactions  . Ace Inhibitors Cough  . Rosuvastatin Other (See Comments)    Muscle pains  . Statins Other (See Comments)    Muscle pains   Current Outpatient Medications  on File Prior to Visit  Medication Sig Dispense Refill  . Ascorbic Acid (VITAMIN C PO) Take 1 tablet by mouth daily.    . Cholecalciferol (VITAMIN D PO) Take 1 capsule by mouth daily.    . furosemide (LASIX) 20 MG tablet Take 1 tablet (20 mg total) by mouth every other day. 45 tablet 3  . meloxicam (MOBIC) 15 MG tablet Take 15 mg by mouth daily as needed for pain.     . metoprolol succinate (TOPROL-XL) 25 MG 24 hr tablet Take 1 tablet (25 mg total) by mouth daily. Take with or immediately following a meal. 90 tablet 3  . rosuvastatin (CRESTOR) 5 MG tablet TAKE 1 TABLET EVERY OTHER DAY 45 tablet 3  . sacubitril-valsartan (ENTRESTO) 24-26 MG Take 1 tablet by mouth 2 (two) times daily. 180 tablet 3  . spironolactone (ALDACTONE) 25 MG tablet Take 0.5 tablets (12.5 mg total) by mouth daily. 45 tablet 3  . warfarin (COUMADIN) 5 MG tablet Take 1 tablet (5 mg total) by mouth daily. 90 tablet 3  . ezetimibe (ZETIA) 10 MG tablet Take 1 tablet (10 mg total) by mouth daily. 90 tablet 3   No current facility-administered medications on file prior to visit.    Review of Systems Constitutional: Negative for other unusual diaphoresis, sweats, appetite or weight changes HENT: Negative for other worsening hearing loss, ear pain, facial swelling, mouth sores or neck stiffness.   Eyes: Negative for other worsening pain, redness or other visual disturbance.  Respiratory: Negative for other stridor or swelling Cardiovascular: Negative for other palpitations or other chest pain  Gastrointestinal: Negative for worsening diarrhea or loose stools, blood in stool, distention or other pain Genitourinary: Negative for hematuria, flank pain or other change in urine volume.  Musculoskeletal: Negative for myalgias or other joint swelling.  Skin: Negative for other color change, or other wound or worsening drainage.  Neurological: Negative for other syncope or numbness. Hematological: Negative for other adenopathy or  swelling Psychiatric/Behavioral: Negative for hallucinations, other worsening agitation, SI, self-injury, or new decreased concentration All other system neg per pt    Objective:   Physical Exam BP 136/84   Pulse 68   Temp 98.1 F (36.7 C) (Oral)   Ht 5\' 11"  (1.803 m)   Wt 219 lb (99.3 kg)   SpO2 96%   BMI 30.54 kg/m  VS noted, non toxic Constitutional: Pt is oriented to person, place, and time. Appears well-developed and well-nourished, in no significant distress and comfortable Head: Normocephalic and atraumatic  Eyes: Conjunctivae and EOM are normal. Pupils are equal, round, and reactive to light Bilat tm's with mild erythema.  Max sinus areas non tender.  Pharynx with mild erythema, no exudate Right Ear: External ear normal without discharge Left Ear: External ear normal without discharge Nose: Nose  without discharge or deformity Mouth/Throat: Oropharynx is without other ulcerations and moist  Neck: Normal range of motion. Neck supple. No JVD present. No tracheal deviation present or significant neck LA or mass Cardiovascular: Normal rate, regular rhythm, normal heart sounds and intact distal pulses.   Pulmonary/Chest: WOB normal and breath sounds without rales or wheezing  Abdominal: Soft. Bowel sounds are normal. NT. No HSM  Musculoskeletal: Normal range of motion. Exhibits no edema Lymphadenopathy: Has no other cervical adenopathy.  Neurological: Pt is alert and oriented to person, place, and time. Pt has normal reflexes. No cranial nerve deficit. Motor grossly intact, Gait intact Skin: Skin is warm and dry. No rash noted or new ulcerations Psychiatric:  Has normal mood and affect. Behavior is normal without agitation No other exam findings Lab Results  Component Value Date   WBC 6.9 03/22/2019   HGB 15.4 03/22/2019   HCT 46.1 03/22/2019   PLT 149.0 (L) 03/22/2019   GLUCOSE 114 (H) 03/22/2019   CHOL 137 03/22/2019   TRIG 144.0 03/22/2019   HDL 42.70 03/22/2019    LDLDIRECT 124.7 05/08/2014   LDLCALC 65 03/22/2019   ALT 20 03/22/2019   AST 20 03/22/2019   NA 140 03/22/2019   K 4.4 03/22/2019   CL 101 03/22/2019   CREATININE 1.23 03/22/2019   BUN 17 03/22/2019   CO2 31 03/22/2019   TSH 4.02 03/22/2019   PSA 0.87 03/22/2019   INR 1.4 (A) 03/22/2019   HGBA1C 6.1 03/22/2019        Assessment & Plan:

## 2019-03-24 ENCOUNTER — Encounter: Payer: Self-pay | Admitting: Internal Medicine

## 2019-03-25 ENCOUNTER — Encounter: Payer: Self-pay | Admitting: Internal Medicine

## 2019-03-25 NOTE — Assessment & Plan Note (Signed)
stable overall by history and exam, recent data reviewed with pt, and pt to continue medical treatment as before,  to f/u any worsening symptoms or concerns  

## 2019-03-25 NOTE — Assessment & Plan Note (Addendum)
Mild uncontrolled, to add nasacort asd,  to f/u any worsening symptoms or concerns  In addition to the time spent performing CPE, I spent an additional 25 minutes face to face,in which greater than 50% of this time was spent in counseling and coordination of care for patient's acute illness as documented, including the differential dx, treatment, further evaluation and other management of allergic rhinitis, HTN, hyperglycemia, ED abd vit d deficiency

## 2019-03-25 NOTE — Assessment & Plan Note (Signed)
Also for lab f/u, may need further replacement

## 2019-03-25 NOTE — Assessment & Plan Note (Signed)

## 2019-03-25 NOTE — Assessment & Plan Note (Signed)
Recent worsening, for viagra prn,  to f/u any worsening symptoms or concerns 

## 2019-03-28 ENCOUNTER — Other Ambulatory Visit: Payer: Self-pay

## 2019-03-28 ENCOUNTER — Ambulatory Visit (INDEPENDENT_AMBULATORY_CARE_PROVIDER_SITE_OTHER): Payer: Medicare HMO

## 2019-03-28 DIAGNOSIS — I513 Intracardiac thrombosis, not elsewhere classified: Secondary | ICD-10-CM | POA: Diagnosis not present

## 2019-03-28 DIAGNOSIS — Z5181 Encounter for therapeutic drug level monitoring: Secondary | ICD-10-CM | POA: Diagnosis not present

## 2019-03-28 DIAGNOSIS — I240 Acute coronary thrombosis not resulting in myocardial infarction: Secondary | ICD-10-CM

## 2019-03-28 LAB — POCT INR: INR: 1.8 — AB (ref 2.0–3.0)

## 2019-03-28 NOTE — Patient Instructions (Signed)
Description   Take 1.5 tablets today, then start taking 1 tablet daily except 1.5 tablet on Fridays. Continue warfarin 5mg  daily.  Recheck in 1 week.

## 2019-04-04 ENCOUNTER — Other Ambulatory Visit: Payer: Self-pay

## 2019-04-04 ENCOUNTER — Ambulatory Visit (INDEPENDENT_AMBULATORY_CARE_PROVIDER_SITE_OTHER): Payer: Medicare HMO | Admitting: *Deleted

## 2019-04-04 DIAGNOSIS — I513 Intracardiac thrombosis, not elsewhere classified: Secondary | ICD-10-CM | POA: Diagnosis not present

## 2019-04-04 DIAGNOSIS — I240 Acute coronary thrombosis not resulting in myocardial infarction: Secondary | ICD-10-CM

## 2019-04-04 DIAGNOSIS — Z5181 Encounter for therapeutic drug level monitoring: Secondary | ICD-10-CM

## 2019-04-04 LAB — POCT INR: INR: 3.5 — AB (ref 2.0–3.0)

## 2019-04-04 NOTE — Patient Instructions (Signed)
Hold coumadin tonight then decrease dose to 1 tablet daily except 1/2 tablet on Fridays.  Recheck in 1 week.

## 2019-04-11 ENCOUNTER — Ambulatory Visit (INDEPENDENT_AMBULATORY_CARE_PROVIDER_SITE_OTHER): Payer: Medicare HMO | Admitting: *Deleted

## 2019-04-11 ENCOUNTER — Other Ambulatory Visit: Payer: Self-pay

## 2019-04-11 DIAGNOSIS — Z5181 Encounter for therapeutic drug level monitoring: Secondary | ICD-10-CM | POA: Diagnosis not present

## 2019-04-11 DIAGNOSIS — I513 Intracardiac thrombosis, not elsewhere classified: Secondary | ICD-10-CM

## 2019-04-11 DIAGNOSIS — I240 Acute coronary thrombosis not resulting in myocardial infarction: Secondary | ICD-10-CM

## 2019-04-11 LAB — POCT INR: INR: 2.1 (ref 2.0–3.0)

## 2019-04-11 NOTE — Patient Instructions (Signed)
Continue coumadin 1 tablet daily except 1/2 tablet on Fridays.  Recheck in 2 weeks

## 2019-04-26 ENCOUNTER — Ambulatory Visit (INDEPENDENT_AMBULATORY_CARE_PROVIDER_SITE_OTHER): Payer: Medicare HMO | Admitting: *Deleted

## 2019-04-26 ENCOUNTER — Other Ambulatory Visit: Payer: Self-pay

## 2019-04-26 DIAGNOSIS — I513 Intracardiac thrombosis, not elsewhere classified: Secondary | ICD-10-CM

## 2019-04-26 DIAGNOSIS — Z5181 Encounter for therapeutic drug level monitoring: Secondary | ICD-10-CM | POA: Diagnosis not present

## 2019-04-26 DIAGNOSIS — I240 Acute coronary thrombosis not resulting in myocardial infarction: Secondary | ICD-10-CM

## 2019-04-26 LAB — POCT INR: INR: 3 (ref 2.0–3.0)

## 2019-04-26 NOTE — Patient Instructions (Signed)
Continue coumadin 1 tablet daily except 1/2 tablet on Fridays.  Recheck in 3 weeks

## 2019-05-17 ENCOUNTER — Ambulatory Visit (INDEPENDENT_AMBULATORY_CARE_PROVIDER_SITE_OTHER): Payer: Medicare HMO | Admitting: *Deleted

## 2019-05-17 ENCOUNTER — Other Ambulatory Visit: Payer: Self-pay

## 2019-05-17 DIAGNOSIS — Z5181 Encounter for therapeutic drug level monitoring: Secondary | ICD-10-CM | POA: Diagnosis not present

## 2019-05-17 DIAGNOSIS — I240 Acute coronary thrombosis not resulting in myocardial infarction: Secondary | ICD-10-CM

## 2019-05-17 LAB — POCT INR: INR: 2.3 (ref 2.0–3.0)

## 2019-05-17 NOTE — Patient Instructions (Signed)
Continue coumadin 1 tablet daily except 1/2 tablet on Fridays.  Recheck in 4 weeks

## 2019-06-14 ENCOUNTER — Other Ambulatory Visit: Payer: Self-pay

## 2019-06-14 ENCOUNTER — Ambulatory Visit (INDEPENDENT_AMBULATORY_CARE_PROVIDER_SITE_OTHER): Payer: Medicare HMO | Admitting: *Deleted

## 2019-06-14 DIAGNOSIS — I240 Acute coronary thrombosis not resulting in myocardial infarction: Secondary | ICD-10-CM | POA: Diagnosis not present

## 2019-06-14 DIAGNOSIS — Z5181 Encounter for therapeutic drug level monitoring: Secondary | ICD-10-CM

## 2019-06-14 LAB — POCT INR: INR: 1.9 — AB (ref 2.0–3.0)

## 2019-06-14 NOTE — Patient Instructions (Signed)
Increase warfarin to 1 tablet daily  Recheck in 4 weeks 

## 2019-06-28 ENCOUNTER — Ambulatory Visit (HOSPITAL_COMMUNITY): Payer: Medicare HMO | Attending: Cardiovascular Disease

## 2019-06-28 ENCOUNTER — Other Ambulatory Visit: Payer: Self-pay

## 2019-06-28 DIAGNOSIS — I255 Ischemic cardiomyopathy: Secondary | ICD-10-CM | POA: Insufficient documentation

## 2019-06-28 MED ORDER — PERFLUTREN LIPID MICROSPHERE
1.0000 mL | INTRAVENOUS | Status: AC | PRN
  Administered 2019-06-28: 2 mL via INTRAVENOUS

## 2019-07-11 ENCOUNTER — Other Ambulatory Visit: Payer: Self-pay

## 2019-07-11 ENCOUNTER — Ambulatory Visit (INDEPENDENT_AMBULATORY_CARE_PROVIDER_SITE_OTHER): Payer: Medicare HMO | Admitting: *Deleted

## 2019-07-11 DIAGNOSIS — Z5181 Encounter for therapeutic drug level monitoring: Secondary | ICD-10-CM | POA: Diagnosis not present

## 2019-07-11 DIAGNOSIS — I240 Acute coronary thrombosis not resulting in myocardial infarction: Secondary | ICD-10-CM | POA: Diagnosis not present

## 2019-07-11 LAB — POCT INR: INR: 2.7 (ref 2.0–3.0)

## 2019-07-11 NOTE — Patient Instructions (Signed)
Continue warfarin 1 tablet daily.   Recheck in 4 weeks   

## 2019-07-11 NOTE — Progress Notes (Signed)
HPI: FU CAD; history dates back to 1998 when he had a myocardial infarction. He subsequently underwent minimally invasive LIMA to his LAD. Nuclear study May 2016 showed ejection fraction 40%, prior anterior apical infarct but no ischemia.Echocardiogram June 2020 showed ejection fraction 30 to 35% and possible LV apical thrombus though not definitive. Cardiac MRI recommended. There was mild left atrial enlargement, mild mitral regurgitation, trace aortic insufficiency.  Cardiac MRI July 2020 showed ejection fraction approximately 30% and apical thrombus noted.  Mild aortic and mitral regurgitation. Follow-up echocardiogram on medications November 2020 showed ejection fraction 30 to 35%, moderate left ventricular enlargement, biatrial enlargement, mild mitral, tricuspid and aortic insufficiency, mild aortic stenosis.  Since I last saw him,  he has some chest tightness with vigorous activities relieved with rest.  He does not have this with routine activities or at rest.  He has some fatigue.  No orthopnea, PND or pedal edema.  No syncope.  Current Outpatient Medications  Medication Sig Dispense Refill  . Ascorbic Acid (VITAMIN C PO) Take 1 tablet by mouth daily.    . Cholecalciferol (VITAMIN D PO) Take 1 capsule by mouth daily.    Marland Kitchen ezetimibe (ZETIA) 10 MG tablet Take 1 tablet (10 mg total) by mouth daily. 90 tablet 3  . furosemide (LASIX) 20 MG tablet Take 1 tablet (20 mg total) by mouth every other day. 45 tablet 3  . metoprolol succinate (TOPROL-XL) 25 MG 24 hr tablet Take 1 tablet (25 mg total) by mouth daily. Take with or immediately following a meal. 90 tablet 3  . rosuvastatin (CRESTOR) 5 MG tablet TAKE 1 TABLET EVERY OTHER DAY 45 tablet 3  . sacubitril-valsartan (ENTRESTO) 24-26 MG Take 1 tablet by mouth 2 (two) times daily. 180 tablet 3  . sildenafil (VIAGRA) 100 MG tablet Take 0.5-1 tablets (50-100 mg total) by mouth daily as needed for erectile dysfunction. 15 tablet 3  .  spironolactone (ALDACTONE) 25 MG tablet Take 0.5 tablets (12.5 mg total) by mouth daily. 45 tablet 3  . triamcinolone (NASACORT) 55 MCG/ACT AERO nasal inhaler Place 2 sprays into the nose daily. 3 Inhaler 3  . warfarin (COUMADIN) 5 MG tablet Take 1 tablet (5 mg total) by mouth daily. 90 tablet 3  . meloxicam (MOBIC) 15 MG tablet Take 15 mg by mouth daily as needed for pain.      No current facility-administered medications for this visit.      Past Medical History:  Diagnosis Date  . ALLERGIC RHINITIS 10/31/2007   Qualifier: Diagnosis of  By: Jenny Reichmann MD, [place] Oris   . Allergy   . CHOLECYSTECTOMY, HX OF 06/04/2007   Qualifier: Diagnosis of  By: Danny Lawless CMA, [place]    . COLONIC POLYPS, HX OF 10/31/2007   Qualifier: Diagnosis of  By: Jenny Reichmann MD, [place], HX OF 06/04/2007   Qualifier: Diagnosis of  By: [place], [place]    . CORONARY ARTERY DISEASE 06/04/2007   Qualifier: Diagnosis of  By: [place], [place]    . Diverticulosis   . Erectile dysfunction   . ERECTILE DYSFUNCTION 06/04/2007   Qualifier: History of  By: Danny Lawless CMA, [place]    . Eye twitch    right eye since chilhood   . HYPERLIPIDEMIA 06/04/2007   Qualifier: Diagnosis of  By: Danny Lawless CMA, [place]    . Hypersomnolence   . HYPERTENSION 06/04/2007   Qualifier: Diagnosis of  By: [place], [place]    .  Hypothyroidism   . ISCHEMIC CARDIOMYOPATHY 06/04/2007   Qualifier: Diagnosis of  By: Danny Lawless CMA, [place]    . Myocardial infarction [facility])    per patient , his occurred in 1998   . NICM (nonischemic cardiomyopathy) ([place])   . OSA (obstructive sleep apnea)   . Osteoarthritis   . PLMD (periodic limb movement disorder)   . Sleep apnea    no cpap' per patient , "i was checked for it and they said i didnt have it "   . Vertigo   . Vitamin D deficiency     Past Surgical History:  Procedure Laterality Date  . CHOLECYSTECTOMY    . COLONOSCOPY    . CORONARY ARTERY BYPASS GRAFT    . KNEE  ARTHROPLASTY Left 08/05/2017   Procedure: LEFT TOTAL KNEE ARTHROPLASTY WITH COMPUTER NAVIGATION;  Surgeon: Rod Can, MD;  Location: [facility];  Service: Orthopedics;  Laterality: Left;  Needs RNFA  . PENILE PROSTHESIS IMPLANT    . REPLACEMENT TOTAL KNEE  06/12/2013  . spinal cyst    . THORACOTOMY     left anterior; wound exploration and debridement  . TONSILLECTOMY AND ADENOIDECTOMY     age 4    Social History   Socioeconomic History  . Marital status: Married    Spouse name: Not on file  . Number of children: 3  . Years of education: Not on file  . Highest education level: Not on file  Occupational History  . Occupation: truck Education administrator: [employer]  . Financial resource strain: Not on file  . Food insecurity    Worry: Not on file    Inability: Not on file  . Transportation needs    Medical: Not on file    Non-medical: Not on file  Tobacco Use  . Smoking status: Former Smoker    Packs/day: 4.00    Years: 17.00    Pack years: 68.00    Types: Cigarettes    Quit date: 05/07/1981    Years since quitting: 38.2  . Smokeless tobacco: Never Used  . Tobacco comment: Pt states that he would let most of them "burn" pt states that he used anywhere between 4-5PPD  Substance and Sexual Activity  . Alcohol use: Yes    Alcohol/week: 0.0 standard drinks    Comment: rare  . Drug use: No  . Sexual activity: Not on file  Lifestyle  . Physical activity    Days per week: Not on file    Minutes per session: Not on file  . Stress: Not on file  Relationships  . Social Herbalist on phone: Not on file    Gets together: Not on file    Attends religious service: Not on file    Active member of club or organization: Not on file    Attends meetings of clubs or organizations: Not on file    Relationship status: Not on file  . Intimate partner violence    Fear of current or ex partner: Not on file    Emotionally abused: Not on file    Physically  abused: Not on file    Forced sexual activity: Not on file  Other Topics Concern  . Not on file  Social History Narrative  . Not on file    Family History  Problem Relation Age of Onset  . Stomach cancer Mother   . Lung cancer Father   . Heart disease Brother  first MI at 29yo, now 18 for transplant list/ ICM  . Colon cancer Maternal Grandfather   . Esophageal cancer Neg Hx   . Pancreatic cancer Neg Hx   . Prostate cancer Neg Hx   . Rectal cancer Neg Hx     ROS: Cough but no fevers or chills, hemoptysis, dysphasia, odynophagia, melena, hematochezia, dysuria, hematuria, rash, seizure activity, orthopnea, PND, pedal edema, claudication. Remaining systems are negative.  Physical Exam: Well-developed well-nourished in no acute distress.  Skin is warm and dry.  HEENT is normal.  Neck is supple.  Chest is clear to auscultation with normal expansion.  Cardiovascular exam is regular rate and rhythm.  Abdominal exam nontender or distended. No masses palpated. Extremities show no edema. neuro grossly intact  ECG-sinus rhythm with occasional PAC and PVC, anterior infarct with T wave inversion.  Personally reviewed  A/P  1 coronary artery disease status post coronary artery bypass graft-patient with chest tightness with vigorous activities relieved with rest.  Increase Toprol to 50 mg daily.  Arrange Lexiscan nuclear study for risk stratification.  Plan to continue medical therapy with statin.  He is not on aspirin given need for anticoagulation.   2 LV apical thrombus-continue Coumadin with goal INR 2-3.  3 ischemic cardiomyopathy-continue Entresto and beta-blocker.  Follow-up echocardiogram shows ejection fraction 30 to 35%.  I have recommended ICD but he is somewhat hesitant.  I explained the risk of sudden death.  He will consider and contact us if he is agreeable.  If so we will arrange a visit with electrophysiology for consideration of ICD.  4 hypertension-patient's blood  pressure is controlled.  Continue present medications and follow.  5 hyperlipidemia-continue Crestor and Zetia.  As outlined previously he did not tolerate high-dose statin in the past.  Kirk Ruths, MD

## 2019-07-19 ENCOUNTER — Other Ambulatory Visit: Payer: Self-pay

## 2019-07-19 ENCOUNTER — Ambulatory Visit (INDEPENDENT_AMBULATORY_CARE_PROVIDER_SITE_OTHER): Payer: Medicare HMO | Admitting: Cardiology

## 2019-07-19 ENCOUNTER — Encounter: Payer: Self-pay | Admitting: Cardiology

## 2019-07-19 VITALS — BP 119/78 | HR 64 | Temp 98.6°F | Ht 71.0 in | Wt 220.0 lb

## 2019-07-19 DIAGNOSIS — I255 Ischemic cardiomyopathy: Secondary | ICD-10-CM | POA: Diagnosis not present

## 2019-07-19 DIAGNOSIS — E7800 Pure hypercholesterolemia, unspecified: Secondary | ICD-10-CM | POA: Diagnosis not present

## 2019-07-19 DIAGNOSIS — I10 Essential (primary) hypertension: Secondary | ICD-10-CM | POA: Diagnosis not present

## 2019-07-19 DIAGNOSIS — I240 Acute coronary thrombosis not resulting in myocardial infarction: Secondary | ICD-10-CM

## 2019-07-19 DIAGNOSIS — I2510 Atherosclerotic heart disease of native coronary artery without angina pectoris: Secondary | ICD-10-CM

## 2019-07-19 DIAGNOSIS — R079 Chest pain, unspecified: Secondary | ICD-10-CM | POA: Diagnosis not present

## 2019-07-19 MED ORDER — METOPROLOL SUCCINATE ER 50 MG PO TB24: 50.0000 mg | ORAL_TABLET | Freq: Every day | ORAL | 3 refills | Status: DC

## 2019-07-19 NOTE — Patient Instructions (Signed)
Medication Instructions:  INCREASE METOPROLOL TO 50 MG ONCE DAILY= 2 OF THE 25 MG TABLETS ONCE DAILY  *If you need a refill on your cardiac medications before your next appointment, please call your pharmacy*  Lab Work: If you have labs (blood work) drawn today and your tests are completely normal, you will receive your results only by: Marland Kitchen MyChart Message (if you have MyChart) OR . A paper copy in the mail If you have any lab test that is abnormal or we need to change your treatment, we will call you to review the results.  Testing/Procedures: Your physician has requested that you have a lexiscan myoview. For further information please visit [email or web address]. Please follow instruction sheet, as given.[place]    Follow-Up: At [facility], you and your health needs are our priority.  As part of our continuing mission to provide you with exceptional heart care, we have created designated Provider Care Teams.  These Care Teams include your primary Cardiologist (physician) and Advanced Practice Providers (APPs -  Physician Assistants and Nurse Practitioners) who all work together to provide you with the care you need, when you need it.  Your next appointment:   3 month(s)  The format for your next appointment:   In Person  Provider:   Kirk Ruths, MD

## 2019-08-08 ENCOUNTER — Ambulatory Visit (INDEPENDENT_AMBULATORY_CARE_PROVIDER_SITE_OTHER): Payer: Medicare HMO | Admitting: *Deleted

## 2019-08-08 ENCOUNTER — Other Ambulatory Visit: Payer: Self-pay

## 2019-08-08 DIAGNOSIS — I240 Acute coronary thrombosis not resulting in myocardial infarction: Secondary | ICD-10-CM

## 2019-08-08 DIAGNOSIS — Z5181 Encounter for therapeutic drug level monitoring: Secondary | ICD-10-CM

## 2019-08-08 LAB — POCT INR: INR: 2.6 (ref 2.0–3.0)

## 2019-08-08 NOTE — Patient Instructions (Signed)
Continue warfarin 1 tablet daily  Recheck in 6 weeks.  

## 2019-08-09 ENCOUNTER — Other Ambulatory Visit: Payer: Self-pay

## 2019-08-14 ENCOUNTER — Telehealth (HOSPITAL_COMMUNITY): Payer: Self-pay | Admitting: *Deleted

## 2019-08-14 NOTE — Telephone Encounter (Signed)
Patient given detailed instructions per Myocardial Perfusion Study Information Sheet for the test on 08/15/18. Patient notified to arrive 15 minutes early and that it is imperative to arrive on time for appointment to keep from having the test rescheduled.  If you need to cancel or reschedule your appointment, please call the office within 24 hours of your appointment. . Patient verbalized understanding. Kirstie Peri

## 2019-08-15 ENCOUNTER — Other Ambulatory Visit: Payer: Self-pay | Admitting: *Deleted

## 2019-08-15 MED ORDER — WARFARIN SODIUM 5 MG PO TABS: ORAL_TABLET | ORAL | 0 refills | Status: DC

## 2019-08-16 ENCOUNTER — Ambulatory Visit (HOSPITAL_COMMUNITY): Payer: Medicare HMO | Attending: Cardiology

## 2019-08-16 ENCOUNTER — Other Ambulatory Visit: Payer: Self-pay

## 2019-08-16 DIAGNOSIS — R079 Chest pain, unspecified: Secondary | ICD-10-CM | POA: Diagnosis not present

## 2019-08-16 LAB — MYOCARDIAL PERFUSION IMAGING
LV dias vol: 143 mL (ref 62–150)
LV sys vol: 97 mL
Peak HR: 86 {beats}/min
Rest HR: 64 {beats}/min
SDS: 3
SRS: 20
SSS: 23
TID: 1.04

## 2019-08-16 MED ORDER — TECHNETIUM TC 99M TETROFOSMIN IV KIT
9.6000 | PACK | Freq: Once | INTRAVENOUS | Status: AC | PRN
  Administered 2019-08-16: 9.6 via INTRAVENOUS
  Filled 2019-08-16: qty 10

## 2019-08-16 MED ORDER — REGADENOSON 0.4 MG/5ML IV SOLN
0.4000 mg | Freq: Once | INTRAVENOUS | Status: AC
  Administered 2019-08-16: 0.4 mg via INTRAVENOUS

## 2019-08-16 MED ORDER — TECHNETIUM TC 99M TETROFOSMIN IV KIT
30.6000 | PACK | Freq: Once | INTRAVENOUS | Status: AC | PRN
  Administered 2019-08-16: 12:00:00 30.6 via INTRAVENOUS
  Filled 2019-08-16: qty 31

## 2019-09-06 DIAGNOSIS — H52203 Unspecified astigmatism, bilateral: Secondary | ICD-10-CM | POA: Diagnosis not present

## 2019-09-06 DIAGNOSIS — H5203 Hypermetropia, bilateral: Secondary | ICD-10-CM | POA: Diagnosis not present

## 2019-09-06 DIAGNOSIS — H524 Presbyopia: Secondary | ICD-10-CM | POA: Diagnosis not present

## 2019-09-19 ENCOUNTER — Ambulatory Visit (INDEPENDENT_AMBULATORY_CARE_PROVIDER_SITE_OTHER): Payer: Medicare HMO | Admitting: *Deleted

## 2019-09-19 ENCOUNTER — Other Ambulatory Visit: Payer: Self-pay

## 2019-09-19 DIAGNOSIS — I240 Acute coronary thrombosis not resulting in myocardial infarction: Secondary | ICD-10-CM

## 2019-09-19 DIAGNOSIS — Z5181 Encounter for therapeutic drug level monitoring: Secondary | ICD-10-CM

## 2019-09-19 LAB — POCT INR: INR: 2.5 (ref 2.0–3.0)

## 2019-09-19 NOTE — Patient Instructions (Signed)
Continue warfarin 1 tablet daily  Recheck in 6 weeks.  

## 2019-09-27 ENCOUNTER — Ambulatory Visit: Payer: Medicare HMO | Admitting: Internal Medicine

## 2019-10-13 ENCOUNTER — Other Ambulatory Visit: Payer: Self-pay | Admitting: Cardiology

## 2019-10-17 ENCOUNTER — Ambulatory Visit: Payer: Medicare HMO | Admitting: Cardiology

## 2019-10-23 ENCOUNTER — Other Ambulatory Visit: Payer: Self-pay | Admitting: Cardiology

## 2019-10-31 ENCOUNTER — Other Ambulatory Visit: Payer: Self-pay

## 2019-10-31 ENCOUNTER — Ambulatory Visit (INDEPENDENT_AMBULATORY_CARE_PROVIDER_SITE_OTHER): Payer: Medicare HMO | Admitting: *Deleted

## 2019-10-31 DIAGNOSIS — Z5181 Encounter for therapeutic drug level monitoring: Secondary | ICD-10-CM

## 2019-10-31 DIAGNOSIS — I240 Acute coronary thrombosis not resulting in myocardial infarction: Secondary | ICD-10-CM | POA: Diagnosis not present

## 2019-10-31 LAB — POCT INR: INR: 3.2 — AB (ref 2.0–3.0)

## 2019-10-31 NOTE — Patient Instructions (Signed)
Hold warfarin tonight then resume 1 tablet daily  °Recheck in 6 weeks.  °

## 2019-11-28 NOTE — Progress Notes (Signed)
HPI: FU CAD; history dates back to 1998 when he had a myocardial infarction. He subsequently underwent minimally invasive LIMA to his LAD. Nuclear study May 2016 showed ejection fraction 40%, prior anterior apical infarct but no ischemia.Echocardiogram June 2020 showed ejection fraction 30 to 35% and possible LV apical thrombus though not definitive. Cardiac MRI recommended. There was mild left atrial enlargement, mild mitral regurgitation, trace aortic insufficiency. Cardiac MRI July 2020 showed ejection fraction approximately 30% and apical thrombus noted. Mild aortic and mitral regurgitation. Follow-up echocardiogram on medications November 2020 showed ejection fraction 30 to 35%, moderate left ventricular enlargement, biatrial enlargement, mild mitral, tricuspid and aortic insufficiency, mild aortic stenosis. Nuclear study January 2021 showed ejection fraction 32%, prior infarct but no ischemia.  Since I last saw him,  states he had difficulties with congestion and cough.  He therefore discontinued his spironolactone and Lasix.  His symptoms have improved by his report.  Patient has dyspnea with more vigorous activities but not routine activities.  He denies increased pedal edema.  He denies chest pain or syncope.  Current Outpatient Medications  Medication Sig Dispense Refill  . meloxicam (MOBIC) 15 MG tablet Take by mouth.    . metoprolol succinate (TOPROL-XL) 50 MG 24 hr tablet Take 1 tablet (50 mg total) by mouth daily. Take with or immediately following a meal. 90 tablet 3  . rosuvastatin (CRESTOR) 5 MG tablet Take 1 tablet (5 mg total) by mouth every other day. 45 tablet 3  . sacubitril-valsartan (ENTRESTO) 24-26 MG Take 1 tablet by mouth 2 (two) times daily. 180 tablet 3  . triamcinolone (NASACORT) 55 MCG/ACT AERO nasal inhaler Place 2 sprays into the nose daily. 3 Inhaler 3  . warfarin (COUMADIN) 5 MG tablet TAKE AS DIRECTED BY THE COUMADIN CLINIC 100 tablet 0   No current  facility-administered medications for this visit.     Past Medical History:  Diagnosis Date  . ALLERGIC RHINITIS 10/31/2007   Qualifier: Diagnosis of  By: Jenny Reichmann MD, Hunt Oris   . Allergy   . CHOLECYSTECTOMY, HX OF 06/04/2007   Qualifier: Diagnosis of  By: Danny Lawless CMA, [place]    . COLONIC POLYPS, HX OF 10/31/2007   Qualifier: Diagnosis of  By: Jenny Reichmann MD, [place], HX OF 06/04/2007   Qualifier: Diagnosis of  By: [place], [place]    . CORONARY ARTERY DISEASE 06/04/2007   Qualifier: Diagnosis of  By: [place], [place]    . Diverticulosis   . Erectile dysfunction   . ERECTILE DYSFUNCTION 06/04/2007   Qualifier: History of  By: Danny Lawless CMA, [place]    . Eye twitch    right eye since chilhood   . HYPERLIPIDEMIA 06/04/2007   Qualifier: Diagnosis of  By: Danny Lawless CMA, [place]    . Hypersomnolence   . HYPERTENSION 06/04/2007   Qualifier: Diagnosis of  By: [place], [place]    . Hypothyroidism   . ISCHEMIC CARDIOMYOPATHY 06/04/2007   Qualifier: Diagnosis of  By: Danny Lawless CMA, [place]    . Myocardial infarction [facility])    per patient , his occurred in 1998   . NICM (nonischemic cardiomyopathy) ([place])   . OSA (obstructive sleep apnea)   . Osteoarthritis   . PLMD (periodic limb movement disorder)   . Sleep apnea    no cpap' per patient , "i was checked for it and they said i didnt have it "   . Vertigo   . Vitamin D  deficiency     Past Surgical History:  Procedure Laterality Date  . CHOLECYSTECTOMY    . COLONOSCOPY    . CORONARY ARTERY BYPASS GRAFT    . KNEE ARTHROPLASTY Left 08/05/2017   Procedure: LEFT TOTAL KNEE ARTHROPLASTY WITH COMPUTER NAVIGATION;  Surgeon: Rod Can, MD;  Location: [facility];  Service: Orthopedics;  Laterality: Left;  Needs RNFA  . PENILE PROSTHESIS IMPLANT    . REPLACEMENT TOTAL KNEE  06/12/2013  . spinal cyst    . THORACOTOMY     left anterior; wound exploration and debridement  . TONSILLECTOMY AND ADENOIDECTOMY     age 46     Social History   Socioeconomic History  . Marital status: Married    Spouse name: Not on file  . Number of children: 3  . Years of education: Not on file  . Highest education level: Not on file  Occupational History  . Occupation: truck Education administrator: [employer]  Tobacco Use  . Smoking status: Former Smoker    Packs/day: 4.00    Years: 17.00    Pack years: 68.00    Types: Cigarettes    Quit date: 05/07/1981    Years since quitting: 38.6  . Smokeless tobacco: Never Used  . Tobacco comment: Pt states that he would let most of them "burn" pt states that he used anywhere between 4-5PPD  Substance and Sexual Activity  . Alcohol use: Yes    Alcohol/week: 0.0 standard drinks    Comment: rare  . Drug use: No  . Sexual activity: Not on file  Other Topics Concern  . Not on file  Social History Narrative  . Not on file   Social Determinants of Health   Financial Resource Strain:   . Difficulty of Paying Living Expenses:   Food Insecurity:   . Worried About Charity fundraiser in the Last Year:   . Arboriculturist in the Last Year:   Transportation Needs:   . Film/video editor (Medical):   Marland Kitchen Lack of Transportation (Non-Medical):   Physical Activity:   . Days of Exercise per Week:   . Minutes of Exercise per Session:   Stress:   . Feeling of Stress :   Social Connections:   . Frequency of Communication with Friends and Family:   . Frequency of Social Gatherings with Friends and Family:   . Attends Religious Services:   . Active Member of Clubs or Organizations:   . Attends Archivist Meetings:   Marland Kitchen Marital Status:   Intimate Partner Violence:   . Fear of Current or Ex-Partner:   . Emotionally Abused:   Marland Kitchen Physically Abused:   . Sexually Abused:     Family History  Problem Relation Age of Onset  . Stomach cancer Mother   . Lung cancer Father   . Heart disease Brother        first MI at 61yo, now 30 for transplant list/ ICM  . Colon  cancer Maternal Grandfather   . Esophageal cancer Neg Hx   . Pancreatic cancer Neg Hx   . Prostate cancer Neg Hx   . Rectal cancer Neg Hx     ROS: no fevers or chills, productive cough, hemoptysis, dysphasia, odynophagia, melena, hematochezia, dysuria, hematuria, rash, seizure activity, orthopnea, PND, pedal edema, claudication. Remaining systems are negative.  Physical Exam: Well-developed well-nourished in no acute distress.  Skin is warm and dry.  HEENT is normal.  Neck is supple.  Chest is clear to auscultation with normal expansion.  Cardiovascular exam is regular rate and rhythm.  Abdominal exam nontender or distended. No masses palpated. Extremities show no edema. neuro grossly intact  A/P  1 coronary artery disease status post coronary artery bypass graft-most recent nuclear study showed no ischemia.  Plan to continue medical therapy with Toprol and statin.  No aspirin given need for anticoagulation.  2 history of LV apical thrombus-we will continue with Coumadin with goal INR 2-3. Check hemoglobin.  3 ischemic cardiomyopathy-ejection fraction 30 to 35%.  Continue Entresto (increased to 49/51 twice daily) and beta-blocker.  We again discussed ICD today and he is now agreeable.  I will arrange an evaluation with electrophysiology.  4 hypertension-blood pressure borderline. Increase Entresto as outlined above. Check potassium and renal function in 1 week.  5 hyperlipidemia-continue Crestor at present dose.  He did not tolerate high-dose statins previously. Check lipids and liver.  Kirk Ruths, MD

## 2019-12-05 ENCOUNTER — Ambulatory Visit (INDEPENDENT_AMBULATORY_CARE_PROVIDER_SITE_OTHER): Payer: Medicare HMO | Admitting: Cardiology

## 2019-12-05 ENCOUNTER — Telehealth: Payer: Self-pay | Admitting: Cardiology

## 2019-12-05 ENCOUNTER — Other Ambulatory Visit: Payer: Self-pay

## 2019-12-05 ENCOUNTER — Encounter: Payer: Self-pay | Admitting: Cardiology

## 2019-12-05 VITALS — BP 138/88 | HR 58 | Temp 98.1°F | Ht 71.0 in | Wt 224.7 lb

## 2019-12-05 DIAGNOSIS — I2510 Atherosclerotic heart disease of native coronary artery without angina pectoris: Secondary | ICD-10-CM | POA: Diagnosis not present

## 2019-12-05 DIAGNOSIS — E7800 Pure hypercholesterolemia, unspecified: Secondary | ICD-10-CM | POA: Diagnosis not present

## 2019-12-05 DIAGNOSIS — I240 Acute coronary thrombosis not resulting in myocardial infarction: Secondary | ICD-10-CM | POA: Diagnosis not present

## 2019-12-05 DIAGNOSIS — I255 Ischemic cardiomyopathy: Secondary | ICD-10-CM

## 2019-12-05 MED ORDER — ENTRESTO 49-51 MG PO TABS: 1.0000 | ORAL_TABLET | Freq: Two times a day (BID) | ORAL | 3 refills | Status: DC

## 2019-12-05 NOTE — Patient Instructions (Signed)
Medication Instructions:  INCREASE ENTRESTO TO 49-51 TWO TIMES A DAY (YOU CAN TAKE TWO TABLETS OF 24-26 TWO TIMES DAILY UNTIL COMPLETED) *If you need a refill on your cardiac medications before your next appointment, please call your pharmacy*   Lab Work: Your physician recommends that you return for lab work in: [place].  If you have labs (blood work) drawn today and your tests are completely normal, you will receive your results only by: Marland Kitchen MyChart Message (if you have MyChart) OR . A paper copy in the mail If you have any lab test that is abnormal or we need to change your treatment, we will call you to review the results.   Follow-Up: At [facility], you and your health needs are our priority.  As part of our continuing mission to provide you with exceptional heart care, we have created designated Provider Care Teams.  These Care Teams include your primary Cardiologist (physician) and Advanced Practice Providers (APPs -  Physician Assistants and Nurse Practitioners) who all work together to provide you with the care you need, when you need it.  We recommend signing up for the patient portal called "MyChart".  Sign up information is provided on this After Visit Summary.  MyChart is used to connect with patients for Virtual Visits (Telemedicine).  Patients are able to view lab/test results, encounter notes, upcoming appointments, etc.  Non-urgent messages can be sent to your provider as well.   To learn more about what you can do with MyChart, go to [email or web address].    Your next appointment:   6 month(s)  The format for your next appointment:   In Person  Provider:   Kirk Ruths, MD   Other Instructions REFERRAL TO EP AT [place] [facility].

## 2019-12-05 NOTE — Addendum Note (Signed)
Addended by: Linton Ham on: 12/05/2019 10:11 AM   Modules accepted: Orders

## 2019-12-05 NOTE — Telephone Encounter (Signed)
[employer] called requesting ICD code. However the representative stated her department does not have a call back number.

## 2019-12-07 ENCOUNTER — Encounter: Payer: Self-pay | Admitting: *Deleted

## 2019-12-08 ENCOUNTER — Other Ambulatory Visit: Payer: Self-pay

## 2019-12-08 ENCOUNTER — Telehealth (INDEPENDENT_AMBULATORY_CARE_PROVIDER_SITE_OTHER): Payer: Medicare HMO | Admitting: Internal Medicine

## 2019-12-08 DIAGNOSIS — I240 Acute coronary thrombosis not resulting in myocardial infarction: Secondary | ICD-10-CM

## 2019-12-08 MED ORDER — VALSARTAN 320 MG PO TABS: 320.0000 mg | ORAL_TABLET | Freq: Every day | ORAL | 3 refills | Status: DC

## 2019-12-08 NOTE — Progress Notes (Signed)
Did not answer for virtual visit VM left Will reschedule

## 2019-12-13 ENCOUNTER — Other Ambulatory Visit: Payer: Self-pay

## 2019-12-13 ENCOUNTER — Ambulatory Visit (INDEPENDENT_AMBULATORY_CARE_PROVIDER_SITE_OTHER): Payer: Medicare HMO | Admitting: *Deleted

## 2019-12-13 DIAGNOSIS — Z5181 Encounter for therapeutic drug level monitoring: Secondary | ICD-10-CM

## 2019-12-13 DIAGNOSIS — I240 Acute coronary thrombosis not resulting in myocardial infarction: Secondary | ICD-10-CM

## 2019-12-13 DIAGNOSIS — I2510 Atherosclerotic heart disease of native coronary artery without angina pectoris: Secondary | ICD-10-CM | POA: Diagnosis not present

## 2019-12-13 DIAGNOSIS — E7800 Pure hypercholesterolemia, unspecified: Secondary | ICD-10-CM | POA: Diagnosis not present

## 2019-12-13 LAB — CBC
Hematocrit: 44.5 % (ref 37.5–51.0)
Hemoglobin: 15.2 g/dL (ref 13.0–17.7)
MCH: 31.6 pg (ref 26.6–33.0)
MCHC: 34.2 g/dL (ref 31.5–35.7)
MCV: 93 fL (ref 79–97)
Platelets: 143 10*3/uL — ABNORMAL LOW (ref 150–450)
RBC: 4.81 x10E6/uL (ref 4.14–5.80)
RDW: 12.7 % (ref 11.6–15.4)
WBC: 5.2 10*3/uL (ref 3.4–10.8)

## 2019-12-13 LAB — BASIC METABOLIC PANEL
BUN/Creatinine Ratio: 16 (ref 10–24)
BUN: 18 mg/dL (ref 8–27)
CO2: 26 mmol/L (ref 20–29)
Calcium: 9.3 mg/dL (ref 8.6–10.2)
Chloride: 103 mmol/L (ref 96–106)
Creatinine, Ser: 1.11 mg/dL (ref 0.76–1.27)
GFR calc Af Amer: 75 mL/min/{1.73_m2} (ref >59)
GFR calc non Af Amer: 65 mL/min/{1.73_m2} (ref >59)
Glucose: 103 mg/dL — ABNORMAL HIGH (ref 65–99)
Potassium: 4.9 mmol/L (ref 3.5–5.2)
Sodium: 140 mmol/L (ref 134–144)

## 2019-12-13 LAB — LIPID PANEL
Chol/HDL Ratio: 4.3 ratio (ref 0.0–5.0)
Cholesterol, Total: 165 mg/dL (ref 100–199)
HDL: 38 mg/dL — ABNORMAL LOW (ref >39)
LDL Chol Calc (NIH): 101 mg/dL — ABNORMAL HIGH (ref 0–99)
Triglycerides: 145 mg/dL (ref 0–149)
VLDL Cholesterol Cal: 26 mg/dL (ref 5–40)

## 2019-12-13 LAB — HEPATIC FUNCTION PANEL
ALT: 15 IU/L (ref 0–44)
AST: 18 IU/L (ref 0–40)
Albumin: 4.2 g/dL (ref 3.7–4.7)
Alkaline Phosphatase: 55 IU/L (ref 39–117)
Bilirubin Total: 0.8 mg/dL (ref 0.0–1.2)
Bilirubin, Direct: 0.23 mg/dL (ref 0.00–0.40)
Total Protein: 6.7 g/dL (ref 6.0–8.5)

## 2019-12-13 LAB — POCT INR: INR: 2.4 (ref 2.0–3.0)

## 2019-12-13 NOTE — Patient Instructions (Signed)
Continue warfarin 1 tablet daily  Recheck in 6 weeks.  

## 2019-12-15 MED ORDER — EZETIMIBE 10 MG PO TABS
10.0000 mg | ORAL_TABLET | Freq: Every day | ORAL | 3 refills | Status: DC
Start: 2019-12-15 — End: 2020-04-09

## 2019-12-18 ENCOUNTER — Telehealth (INDEPENDENT_AMBULATORY_CARE_PROVIDER_SITE_OTHER): Payer: Medicare HMO | Admitting: Internal Medicine

## 2019-12-18 ENCOUNTER — Telehealth: Payer: Self-pay

## 2019-12-18 ENCOUNTER — Other Ambulatory Visit: Payer: Self-pay | Admitting: Cardiology

## 2019-12-18 ENCOUNTER — Other Ambulatory Visit: Payer: Self-pay

## 2019-12-18 ENCOUNTER — Encounter: Payer: Self-pay | Admitting: Internal Medicine

## 2019-12-18 VITALS — BP 142/82 | HR 60 | Ht 71.0 in | Wt 225.0 lb

## 2019-12-18 DIAGNOSIS — D6869 Other thrombophilia: Secondary | ICD-10-CM | POA: Diagnosis not present

## 2019-12-18 DIAGNOSIS — I255 Ischemic cardiomyopathy: Secondary | ICD-10-CM | POA: Diagnosis not present

## 2019-12-18 DIAGNOSIS — I10 Essential (primary) hypertension: Secondary | ICD-10-CM | POA: Diagnosis not present

## 2019-12-18 DIAGNOSIS — I240 Acute coronary thrombosis not resulting in myocardial infarction: Secondary | ICD-10-CM | POA: Diagnosis not present

## 2019-12-18 NOTE — Telephone Encounter (Signed)
-----   Message from Thompson Grayer, MD sent at 12/18/2019  9:23 AM EDT ----- Schedule for ICD implant with me

## 2019-12-18 NOTE — Telephone Encounter (Signed)
Left message for Pt advising I would send a mychart message with first available times for an ICD implant.

## 2019-12-18 NOTE — Telephone Encounter (Signed)
-----   Message from Thompson Grayer, MD sent at 12/18/2019  9:28 AM EDT ----- Hold coumadin 72 hours prior to the procedure

## 2019-12-18 NOTE — Progress Notes (Signed)
Electrophysiology TeleHealth Note   Due to national recommendations of social distancing due to [place] 19, Audio  telehealth visit is felt to be most appropriate for this patient at this time.  See MyChart message from today for patient consent regarding telehealth for [facility].   Date:  12/18/2019   ID:  [name] [name], DOB [birth year], MRN [id number]  Location: home Provider location: [facility] Evaluation Performed: New patient consult  PCP:  Biagio Borg, MD  Cardiologist:  Dr [name] Electrophysiologist:  None  Chief Complaint:  CHF  History of Present Illness:    [name] [name] is a 74 y.o. male who presents via audio conferencing for a telehealth visit today.   The patient is referred for new consultation regarding risk stratification of sudden death by Dr [name].  He reports having CABG after MI in 1998.   He has recently had worsening SOB and weakness.  He had cardiac MRI 02/15/2019 which showed EF 30% with LV apical thrombus/ aneurysm present.  He has been placed on coumadin. He reports that his has difficulty walking more than 50 yards due to weakness and SOB.  He has been treated with GDMT  His EF has not recovered.  Today, he denies symptoms of palpitations, chest pain,  orthopnea, PND, lower extremity edema, claudication, dizziness, presyncope, syncope, bleeding, or neurologic sequela. The patient is tolerating medications without difficulties and is otherwise without complaint today.     Past Medical History:  Diagnosis Date  . ALLERGIC RHINITIS 10/31/2007   Qualifier: Diagnosis of  By: Jenny Reichmann MD, Hunt Oris   . Allergy   . CHOLECYSTECTOMY, HX OF 06/04/2007   Qualifier: Diagnosis of  By: Danny Lawless CMA, [place]    . COLONIC POLYPS, HX OF 10/31/2007   Qualifier: Diagnosis of  By: Jenny Reichmann MD, [place], HX OF 06/04/2007   Qualifier: Diagnosis of  By: [place], [place]    . CORONARY ARTERY DISEASE 06/04/2007   Qualifier:  Diagnosis of  By: [place], [place]    . Diverticulosis   . Erectile dysfunction   . Eye twitch    right eye since chilhood   . HYPERLIPIDEMIA 06/04/2007   Qualifier: Diagnosis of  By: Danny Lawless CMA, [place]    . Hypersomnolence   . HYPERTENSION 06/04/2007   Qualifier: Diagnosis of  By: [place], [place]    . Hypothyroidism   . ISCHEMIC CARDIOMYOPATHY 06/04/2007   Qualifier: Diagnosis of  By: Danny Lawless CMA, [place]    . Myocardial infarction [facility])    per patient , his occurred in 1998   . Osteoarthritis   . PLMD (periodic limb movement disorder)   . Sleep apnea    no cpap' per patient , "i was checked for it and they said i didnt have it "   . Vertigo   . Vitamin D deficiency     Past Surgical History:  Procedure Laterality Date  . CHOLECYSTECTOMY    . COLONOSCOPY    . CORONARY ARTERY BYPASS GRAFT    . KNEE ARTHROPLASTY Left 08/05/2017   Procedure: LEFT TOTAL KNEE ARTHROPLASTY WITH COMPUTER NAVIGATION;  Surgeon: Rod Can, MD;  [facility];  Service: Orthopedics;  Laterality: Left;  Needs RNFA  . PENILE PROSTHESIS IMPLANT    . REPLACEMENT TOTAL KNEE  06/12/2013  . spinal cyst    . THORACOTOMY     left anterior; wound exploration and debridement  . TONSILLECTOMY AND ADENOIDECTOMY  age 43    Current Outpatient Medications  Medication Sig Dispense Refill  . ezetimibe (ZETIA) 10 MG tablet Take 1 tablet (10 mg total) by mouth daily. 90 tablet 3  . meloxicam (MOBIC) 15 MG tablet Take by mouth.    . metoprolol succinate (TOPROL-XL) 50 MG 24 hr tablet Take 1 tablet (50 mg total) by mouth daily. Take with or immediately following a meal. 90 tablet 3  . rosuvastatin (CRESTOR) 5 MG tablet Take 1 tablet (5 mg total) by mouth every other day. 45 tablet 3  . triamcinolone (NASACORT) 55 MCG/ACT AERO nasal inhaler Place 2 sprays into the nose daily. 3 Inhaler 3  . valsartan (DIOVAN) 320 MG tablet Take 1 tablet (320 mg total) by mouth daily. 90 tablet 3  . warfarin (COUMADIN)  5 MG tablet TAKE AS DIRECTED BY THE COUMADIN CLINIC 100 tablet 0   No current facility-administered medications for this visit.    Allergies:   Ace inhibitors, Rosuvastatin, and Statins   Social History:  The patient  reports that he quit smoking about 38 years ago. His smoking use included cigarettes. He has a 68.00 pack-year smoking history. He has never used smokeless tobacco. He reports current alcohol use. He reports that he does not use drugs.   Family History:  The patient's family history includes Colon cancer in his maternal grandfather; Heart disease in his brother; Lung cancer in his father; Stomach cancer in his mother.    ROS:  Please see the history of present illness.   All other systems are personally reviewed and negative.    Exam:    Vital Signs:  BP (!) 142/82   Pulse 60   Ht 5\' 11"  (1.803 m)   Wt 225 lb (102.1 kg)   SpO2 96%   BMI 31.38 kg/m    Well sounding, alert and conversant    Labs/Other Tests and Data Reviewed:    Recent Labs: 03/22/2019: TSH 4.02 12/13/2019: ALT 15; BUN 18; Creatinine, Ser 1.11; Hemoglobin 15.2; Platelets 143; Potassium 4.9; Sodium 140   Wt Readings from Last 3 Encounters:  12/18/19 225 lb (102.1 kg)  12/05/19 224 lb 11.2 oz (101.9 kg)  08/16/19 220 lb (99.8 kg)     Other studies personally reviewed: Additional studies/ records that were reviewed today include: Dr Jacalyn Lefevre notes, prior MRI/ echo  Review of the above records today demonstrates: above   ASSESSMENT & PLAN:    1.  Ischemic CM The patient has an ischemic CM (EF 30%), NYHA Class III CHF, and CAD.  He is referred by Dr [name] for risk stratification of sudden death and consideration of ICD implantation.  At this time, he meets MADIT II/ SCD-HeFT criteria for ICD implantation for primary prevention of sudden death.  I have had a thorough discussion with the patient reviewing options.  The patient and their family (if available) have had opportunities to ask  questions and have them answered. The patient and I have decided together through a shared decision making process to proceed with ICD at this time.  He has a narrow QRS and therefore does not meet criteria for CRT.  We did discuss baroreceptor activitation therapy today.  He may be willing to consider this in the future. Risks, benefits, alternatives to ICD implantation were discussed in detail with the patient today. The patient understands that the risks include but are not limited to bleeding, infection, pneumothorax, perforation, tamponade, vascular damage, renal failure, MI, stroke, death, inappropriate shocks, and lead dislodgement  and wishes to proceed.  We will therefore schedule device implantation at the next available time. Hold coumadin for 72 hours prior to the procedure. 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  We discussed this at length today.  He is R handed and likes to shoot his rifle with the butt of the gun on the R side.  We will plan L sided implant.  1. HTN Stable No change required today    Patient Risk:  after full review of this patients clinical status, I feel that they are at high brisk at this time.   Today, I have spent 25 minutes with the patient with telehealth technology discussing ICD implantation .    Signed, Thompson Grayer MD, [place] 12/18/2019 9:15 AM   [facility] [address] [place] [address] [phone] (office) [phone] (fax)

## 2019-12-19 NOTE — Telephone Encounter (Signed)
Call received from Pt.  Pt scheduled for ICD implant on June 22  Will come in for labs/covid test on June 18  Instruction letter completed and mailed to Pt   Work up complete.

## 2020-01-24 ENCOUNTER — Ambulatory Visit (INDEPENDENT_AMBULATORY_CARE_PROVIDER_SITE_OTHER): Payer: Medicare HMO | Admitting: *Deleted

## 2020-01-24 DIAGNOSIS — Z5181 Encounter for therapeutic drug level monitoring: Secondary | ICD-10-CM

## 2020-01-24 DIAGNOSIS — I240 Acute coronary thrombosis not resulting in myocardial infarction: Secondary | ICD-10-CM | POA: Diagnosis not present

## 2020-01-24 LAB — POCT INR: INR: 2.7 (ref 2.0–3.0)

## 2020-01-24 NOTE — Patient Instructions (Signed)
Continue warfarin 1 tablet daily.   Pending defibrillator placement 6/22.  Take last dose of warfarin 6/18 and resume night of procedure at above dose.  Recheck in 6 weeks

## 2020-01-26 ENCOUNTER — Other Ambulatory Visit (HOSPITAL_COMMUNITY): Payer: Medicare HMO

## 2020-01-26 ENCOUNTER — Other Ambulatory Visit: Payer: Self-pay

## 2020-01-26 ENCOUNTER — Other Ambulatory Visit: Payer: Medicare HMO | Admitting: *Deleted

## 2020-01-26 DIAGNOSIS — I255 Ischemic cardiomyopathy: Secondary | ICD-10-CM

## 2020-01-26 LAB — BASIC METABOLIC PANEL
BUN/Creatinine Ratio: 18 (ref 10–24)
BUN: 20 mg/dL (ref 8–27)
CO2: 30 mmol/L — ABNORMAL HIGH (ref 20–29)
Calcium: 9.3 mg/dL (ref 8.6–10.2)
Chloride: 103 mmol/L (ref 96–106)
Creatinine, Ser: 1.14 mg/dL (ref 0.76–1.27)
GFR calc Af Amer: 73 mL/min/{1.73_m2} (ref >59)
GFR calc non Af Amer: 63 mL/min/{1.73_m2} (ref >59)
Glucose: 114 mg/dL — ABNORMAL HIGH (ref 65–99)
Potassium: 4.7 mmol/L (ref 3.5–5.2)
Sodium: 140 mmol/L (ref 134–144)

## 2020-01-26 LAB — CBC WITH DIFFERENTIAL/PLATELET
Basophils Absolute: 0 10*3/uL (ref 0.0–0.2)
Basos: 1 %
EOS (ABSOLUTE): 0.2 10*3/uL (ref 0.0–0.4)
Eos: 3 %
Hematocrit: 42.7 % (ref 37.5–51.0)
Hemoglobin: 14.4 g/dL (ref 13.0–17.7)
Lymphocytes Absolute: 1.4 10*3/uL (ref 0.7–3.1)
Lymphs: 25 %
MCH: 31.6 pg (ref 26.6–33.0)
MCHC: 33.7 g/dL (ref 31.5–35.7)
MCV: 94 fL (ref 79–97)
Monocytes Absolute: 0.5 10*3/uL (ref 0.1–0.9)
Monocytes: 9 %
Neutrophils Absolute: 3.4 10*3/uL (ref 1.4–7.0)
Neutrophils: 62 %
Platelets: 133 10*3/uL — ABNORMAL LOW (ref 150–450)
RBC: 4.56 x10E6/uL (ref 4.14–5.80)
RDW: 14.1 % (ref 11.6–15.4)
WBC: 5.5 10*3/uL (ref 3.4–10.8)

## 2020-01-26 LAB — PROTIME-INR
INR: 2.8 — ABNORMAL HIGH (ref 0.9–1.2)
Prothrombin Time: 29.6 s — ABNORMAL HIGH (ref 9.1–12.0)

## 2020-01-29 ENCOUNTER — Encounter: Payer: Self-pay | Admitting: *Deleted

## 2020-01-30 ENCOUNTER — Ambulatory Visit (HOSPITAL_COMMUNITY)
Admission: RE | Disposition: A | Discharge status: Home / Self Care | Payer: Self-pay | Source: Home / Self Care | Attending: Internal Medicine

## 2020-01-30 ENCOUNTER — Ambulatory Visit (HOSPITAL_COMMUNITY)
Admission: RE | Admit: 2020-01-30 | Discharge: 2020-01-30 | Disposition: A | Discharge status: Home / Self Care | Payer: Medicare HMO | Attending: Internal Medicine | Admitting: Internal Medicine

## 2020-01-30 ENCOUNTER — Ambulatory Visit (HOSPITAL_COMMUNITY): Payer: Medicare HMO

## 2020-01-30 ENCOUNTER — Other Ambulatory Visit: Payer: Self-pay

## 2020-01-30 DIAGNOSIS — Z7901 Long term (current) use of anticoagulants: Secondary | ICD-10-CM | POA: Diagnosis not present

## 2020-01-30 DIAGNOSIS — I110 Hypertensive heart disease with heart failure: Secondary | ICD-10-CM | POA: Diagnosis not present

## 2020-01-30 DIAGNOSIS — I252 Old myocardial infarction: Secondary | ICD-10-CM | POA: Insufficient documentation

## 2020-01-30 DIAGNOSIS — Z791 Long term (current) use of non-steroidal anti-inflammatories (NSAID): Secondary | ICD-10-CM | POA: Diagnosis not present

## 2020-01-30 DIAGNOSIS — E039 Hypothyroidism, unspecified: Secondary | ICD-10-CM | POA: Diagnosis not present

## 2020-01-30 DIAGNOSIS — Z87891 Personal history of nicotine dependence: Secondary | ICD-10-CM | POA: Insufficient documentation

## 2020-01-30 DIAGNOSIS — Z888 Allergy status to other drugs, medicaments and biological substances status: Secondary | ICD-10-CM | POA: Insufficient documentation

## 2020-01-30 DIAGNOSIS — I255 Ischemic cardiomyopathy: Secondary | ICD-10-CM | POA: Diagnosis not present

## 2020-01-30 DIAGNOSIS — E785 Hyperlipidemia, unspecified: Secondary | ICD-10-CM | POA: Diagnosis not present

## 2020-01-30 DIAGNOSIS — G4730 Sleep apnea, unspecified: Secondary | ICD-10-CM | POA: Diagnosis not present

## 2020-01-30 DIAGNOSIS — Z79899 Other long term (current) drug therapy: Secondary | ICD-10-CM | POA: Diagnosis not present

## 2020-01-30 DIAGNOSIS — Z95810 Presence of automatic (implantable) cardiac defibrillator: Secondary | ICD-10-CM

## 2020-01-30 DIAGNOSIS — E559 Vitamin D deficiency, unspecified: Secondary | ICD-10-CM | POA: Insufficient documentation

## 2020-01-30 DIAGNOSIS — I5022 Chronic systolic (congestive) heart failure: Secondary | ICD-10-CM | POA: Diagnosis not present

## 2020-01-30 DIAGNOSIS — I2510 Atherosclerotic heart disease of native coronary artery without angina pectoris: Secondary | ICD-10-CM | POA: Diagnosis not present

## 2020-01-30 DIAGNOSIS — Z006 Encounter for examination for normal comparison and control in clinical research program: Secondary | ICD-10-CM | POA: Insufficient documentation

## 2020-01-30 DIAGNOSIS — Z951 Presence of aortocoronary bypass graft: Secondary | ICD-10-CM | POA: Insufficient documentation

## 2020-01-30 DIAGNOSIS — I509 Heart failure, unspecified: Secondary | ICD-10-CM | POA: Insufficient documentation

## 2020-01-30 DIAGNOSIS — R001 Bradycardia, unspecified: Secondary | ICD-10-CM | POA: Diagnosis not present

## 2020-01-30 DIAGNOSIS — I495 Sick sinus syndrome: Secondary | ICD-10-CM | POA: Insufficient documentation

## 2020-01-30 HISTORY — PX: ICD IMPLANT: EP1208

## 2020-01-30 LAB — PROTIME-INR
INR: 1.5 — ABNORMAL HIGH (ref 0.8–1.2)
Prothrombin Time: 17.8 seconds — ABNORMAL HIGH (ref 11.4–15.2)

## 2020-01-30 SURGERY — ICD IMPLANT

## 2020-01-30 MED ORDER — ACETAMINOPHEN 325 MG PO TABS: 325.0000 mg | ORAL_TABLET | ORAL | Status: DC | PRN

## 2020-01-30 MED ORDER — ONDANSETRON HCL 4 MG/2ML IJ SOLN: 4.0000 mg | Freq: Four times a day (QID) | INTRAMUSCULAR | Status: DC | PRN

## 2020-01-30 MED ORDER — CHLORHEXIDINE GLUCONATE 4 % EX LIQD
4.0000 "application " | Freq: Once | CUTANEOUS | Status: DC
  Filled 2020-01-30: qty 60

## 2020-01-30 MED ORDER — SODIUM CHLORIDE 0.9% FLUSH: 3.0000 mL | Freq: Two times a day (BID) | INTRAVENOUS | Status: DC

## 2020-01-30 MED ORDER — SODIUM CHLORIDE 0.9 % IV SOLN
80.0000 mg | INTRAVENOUS | Status: AC
  Administered 2020-01-30: 80 mg
  Filled 2020-01-30: qty 2

## 2020-01-30 MED ORDER — HEPARIN (PORCINE) IN NACL 1000-0.9 UT/500ML-% IV SOLN
INTRAVENOUS | Status: AC
  Filled 2020-01-30: qty 500

## 2020-01-30 MED ORDER — MIDAZOLAM HCL 5 MG/5ML IJ SOLN
INTRAMUSCULAR | Status: AC
  Filled 2020-01-30: qty 5

## 2020-01-30 MED ORDER — IOHEXOL 350 MG/ML SOLN
INTRAVENOUS | Status: DC | PRN
  Administered 2020-01-30: 15 mL

## 2020-01-30 MED ORDER — SODIUM CHLORIDE 0.9 % IV SOLN: INTRAVENOUS | Status: DC

## 2020-01-30 MED ORDER — LIDOCAINE HCL 1 % IJ SOLN
INTRAMUSCULAR | Status: AC
  Filled 2020-01-30: qty 60

## 2020-01-30 MED ORDER — SODIUM CHLORIDE 0.9 % IV SOLN
INTRAVENOUS | Status: AC
  Filled 2020-01-30: qty 2

## 2020-01-30 MED ORDER — SODIUM CHLORIDE 0.9 % IV SOLN: 250.0000 mL | INTRAVENOUS | Status: DC | PRN

## 2020-01-30 MED ORDER — LIDOCAINE HCL 1 % IJ SOLN
INTRAMUSCULAR | Status: AC
  Filled 2020-01-30: qty 20

## 2020-01-30 MED ORDER — FENTANYL CITRATE (PF) 100 MCG/2ML IJ SOLN
INTRAMUSCULAR | Status: DC | PRN
  Administered 2020-01-30: 25 ug via INTRAVENOUS

## 2020-01-30 MED ORDER — FENTANYL CITRATE (PF) 100 MCG/2ML IJ SOLN
INTRAMUSCULAR | Status: AC
  Filled 2020-01-30: qty 2

## 2020-01-30 MED ORDER — MIDAZOLAM HCL 5 MG/5ML IJ SOLN
INTRAMUSCULAR | Status: DC | PRN
  Administered 2020-01-30: 2 mg via INTRAVENOUS
  Administered 2020-01-30: 1 mg via INTRAVENOUS

## 2020-01-30 MED ORDER — SODIUM CHLORIDE 0.9% FLUSH: 3.0000 mL | INTRAVENOUS | Status: DC | PRN

## 2020-01-30 MED ORDER — HEPARIN (PORCINE) IN NACL 1000-0.9 UT/500ML-% IV SOLN
INTRAVENOUS | Status: DC | PRN
  Administered 2020-01-30: 500 mL

## 2020-01-30 MED ORDER — CEFAZOLIN SODIUM-DEXTROSE 2-4 GM/100ML-% IV SOLN
2.0000 g | INTRAVENOUS | Status: AC
  Administered 2020-01-30: 2 g via INTRAVENOUS
  Filled 2020-01-30: qty 100

## 2020-01-30 MED ORDER — LIDOCAINE HCL (PF) 1 % IJ SOLN
INTRAMUSCULAR | Status: DC | PRN
  Administered 2020-01-30: 60 mL

## 2020-01-30 SURGICAL SUPPLY — 8 items
CABLE SURGICAL S-101-97-12 (CABLE) ×3 IMPLANT
ICD EVERA DR XT MRI DDMB1D4 (ICD Generator) ×3 IMPLANT
LEAD CAPSURE NOVUS 5076-52CM (Lead) ×3 IMPLANT
LEAD SPRINT QUAT SEC 6935M-62 (Lead) ×3 IMPLANT
PAD PRO RADIOLUCENT 2001M-C (PAD) ×3 IMPLANT
SHEATH 7FR PRELUDE SNAP 13 (SHEATH) ×3 IMPLANT
SHEATH 9FR PRELUDE SNAP 25 (SHEATH) ×3 IMPLANT
TRAY PACEMAKER INSERTION (PACKS) ×3 IMPLANT

## 2020-01-30 NOTE — Discharge Instructions (Signed)
Resume coumadin on Sunday 02/04/20  Remove pressure dressing and tegaderm in 24 hours Do not remove steri-strips  After Your ICD (Implantable Cardiac Defibrillator)    You have a [employer]  ICD  ACTIVITY  Do not lift your arm above shoulder height for 1 week after your procedure. After 7 days, you may progress as below.     Tuesday February 06, 2020  Wednesday February 07, 2020 Thursday February 08, 2020 Friday February 09, 2020    Do not lift, push, pull, or carry anything over 10 pounds with the affected arm until 6 weeks (Tuesday March 12, 2020 ) after your procedure.    Do NOT DRIVE until you have been seen for your wound check, or as long as instructed by your healthcare provider.    Ask your healthcare provider when you can go back to work   INCISION/Dressing  If you are on a blood thinner such as Coumadin, Xarelto, Eliquis, Plavix, or Pradaxa please confirm with your provider when this should be resumed.    Monitor your defibrillator site for redness, swelling, and drainage. Call the device clinic at [phone] if you experience these symptoms or fever/chills.   If your incision is sealed with Steri-strips or staples, you may shower 10 days after your procedure or when told by your provider. Do not remove the steri-strips or let the shower hit directly on your site. You may wash around your site with soap and water.     Avoid lotions, ointments, or perfumes over your incision until it is well-healed.   You may use a hot tub or a pool AFTER your wound check appointment if the incision is completely closed.   Your ICD is designed to protect you from life threatening heart rhythms. Because of this, you may receive a shock.   o 1 shock with no symptoms:  Call the office during business hours. o 1 shock with symptoms (chest pain, chest pressure, dizziness, lightheadedness, shortness of breath, overall feeling unwell):  Call 911. o If you experience 2 or more shocks in 24 hours:   Call 911. o If you receive a shock, you should not drive for 6 months per the [place] DMV IF you receive appropriate therapy from your ICD.    ICD Alerts:  Some alerts are vibratory and others beep. These are NOT emergencies. Please call our office to let us know. If this occurs at night or on weekends, it can wait until the next business day. Send a remote transmission.   If your device is capable of reading fluid status (for heart failure), you will be offered monthly monitoring to review this with you.   DEVICE MANAGEMENT  Remote monitoring is used to monitor your ICD from home. This monitoring is scheduled every 91 days by our office. It allows [place] to keep an eye on the functioning of your device to ensure it is working properly. You will routinely see your Electrophysiologist annually (more often if necessary).    You should receive your ID card for your new device in 4-8 weeks. Keep this card with you at all times once received. Consider wearing a medical alert bracelet or necklace.   Your ICD  may be MRI compatible. This will be discussed at your next office visit/wound check.  You should avoid contact with strong electric or magnetic fields.    Do not use amateur (ham) radio equipment or electric (arc) welding torches. MP3 player headphones with magnets should not be used. Some devices  are safe to use if held at least 12 inches (30 cm) from your defibrillator. These include power tools, lawn mowers, and speakers. If you are unsure if something is safe to use, ask your health care provider.   When using your cell phone, hold it to the ear that is on the opposite side from the defibrillator. Do not leave your cell phone in a pocket over the defibrillator.   You may safely use electric blankets, heating pads, computers, and microwave ovens.  Call the office right away if:  You have chest pain.  You feel more than one shock.  You feel more short of breath than you have felt  before.  You feel more light-headed than you have felt before.  Your incision starts to open up.  This information is not intended to replace advice given to you by your health care provider. Make sure you discuss any questions you have with your health care provider.

## 2020-01-30 NOTE — Progress Notes (Signed)
Patient and wife was given discharge information. Both verbalized understanding. 

## 2020-01-30 NOTE — H&P (Signed)
Chief Complaint:  CHF  History of Present Illness:    [name] [name] is a 74 y.o. male who presents for ICD implantation.  He reports having CABG after MI in 1998.   He has recently had worsening SOB and weakness.  He had cardiac MRI 02/15/2019 which showed EF 30% with LV apical thrombus/ aneurysm present.  He has been placed on coumadin. He reports that his has difficulty walking more than 50 yards due to weakness and SOB.  He has been treated with GDMT  His EF has not recovered.  Today, he denies symptoms of palpitations, chest pain,  orthopnea, PND, lower extremity edema, claudication, dizziness, presyncope, syncope, bleeding, or neurologic sequela. The patient is tolerating medications without difficulties and is otherwise without complaint today.         Past Medical History:  Diagnosis Date  . ALLERGIC RHINITIS 10/31/2007   Qualifier: Diagnosis of  By: Jenny Reichmann MD, Hunt Oris   . Allergy   . CHOLECYSTECTOMY, HX OF 06/04/2007   Qualifier: Diagnosis of  By: Danny Lawless CMA, [place]    . COLONIC POLYPS, HX OF 10/31/2007   Qualifier: Diagnosis of  By: Jenny Reichmann MD, [place], HX OF 06/04/2007   Qualifier: Diagnosis of  By: [place], [place]    . CORONARY ARTERY DISEASE 06/04/2007   Qualifier: Diagnosis of  By: [place], [place]    . Diverticulosis   . Erectile dysfunction   . Eye twitch    right eye since chilhood   . HYPERLIPIDEMIA 06/04/2007   Qualifier: Diagnosis of  By: Danny Lawless CMA, [place]    . Hypersomnolence   . HYPERTENSION 06/04/2007   Qualifier: Diagnosis of  By: [place], [place]    . Hypothyroidism   . ISCHEMIC CARDIOMYOPATHY 06/04/2007   Qualifier: Diagnosis of  By: Danny Lawless CMA, [place]    . Myocardial infarction [facility])    per patient , his occurred in 1998   . Osteoarthritis   . PLMD (periodic limb movement disorder)   . Sleep apnea    no cpap' per patient , "i was checked for it and they said i didnt have it "    . Vertigo   . Vitamin D deficiency          Past Surgical History:  Procedure Laterality Date  . CHOLECYSTECTOMY    . COLONOSCOPY    . CORONARY ARTERY BYPASS GRAFT    . KNEE ARTHROPLASTY Left 08/05/2017   Procedure: LEFT TOTAL KNEE ARTHROPLASTY WITH COMPUTER NAVIGATION;  Surgeon: Rod Can, MD;  Location: [facility];  Service: Orthopedics;  Laterality: Left;  Needs RNFA  . PENILE PROSTHESIS IMPLANT    . REPLACEMENT TOTAL KNEE  06/12/2013  . spinal cyst    . THORACOTOMY     left anterior; wound exploration and debridement  . TONSILLECTOMY AND ADENOIDECTOMY     age 55          Current Outpatient Medications  Medication Sig Dispense Refill  . ezetimibe (ZETIA) 10 MG tablet Take 1 tablet (10 mg total) by mouth daily. 90 tablet 3  . meloxicam (MOBIC) 15 MG tablet Take by mouth.    . metoprolol succinate (TOPROL-XL) 50 MG 24 hr tablet Take 1 tablet (50 mg total) by mouth daily. Take with or immediately following a meal. 90 tablet 3  . rosuvastatin (CRESTOR) 5 MG tablet Take 1 tablet (5 mg total) by mouth every other day. 45 tablet 3  . triamcinolone (NASACORT) 55  MCG/ACT AERO nasal inhaler Place 2 sprays into the nose daily. 3 Inhaler 3  . valsartan (DIOVAN) 320 MG tablet Take 1 tablet (320 mg total) by mouth daily. 90 tablet 3  . warfarin (COUMADIN) 5 MG tablet TAKE AS DIRECTED BY THE COUMADIN CLINIC 100 tablet 0   No current facility-administered medications for this visit.    Allergies:   Ace inhibitors, Rosuvastatin, and Statins   Social History:  The patient  reports that he quit smoking about 38 years ago. His smoking use included cigarettes. He has a 68.00 pack-year smoking history. He has never used smokeless tobacco. He reports current alcohol use. He reports that he does not use drugs.   Family History:  The patient's family history includes Colon cancer in his maternal grandfather; Heart disease in his brother; Lung cancer in his father;  Stomach cancer in his mother.    ROS:  Please see the history of present illness.   All other systems are personally reviewed and negative.   Physical Exam: Vitals:   01/30/20 1133  BP: (!) 174/74  Pulse: (!) 49  Resp: 17  Temp: (!) 97.5 F (36.4 C)  TempSrc: Oral  SpO2: 98%  Weight: 103 kg  Height: 5\' 10"  (1.778 m)    GEN- The patient is well appearing, alert and oriented x 3 today.   Head- normocephalic, atraumatic Eyes-  Sclera clear, conjunctiva pink Ears- hearing intact Oropharynx- clear Neck- supple, Lungs-  normal work of breathing Heart- Regular rate and rhythm  GI- soft  Extremities- no clubbing, cyanosis, or edema, groin is without hematoma/ bruit MS- no significant deformity or atrophy Skin- no rash or lesion Psych- euthymic mood, full affect Neuro- strength and sensation are intact   Labs/Other Tests and Data Reviewed:    Recent Labs: 03/22/2019: TSH 4.02 12/13/2019: ALT 15; BUN 18; Creatinine, Ser 1.11; Hemoglobin 15.2; Platelets 143; Potassium 4.9; Sodium 140      Wt Readings from Last 3 Encounters:  12/18/19 225 lb (102.1 kg)  12/05/19 224 lb 11.2 oz (101.9 kg)  08/16/19 220 lb (99.8 kg)     Other studies personally reviewed: Additional studies/ records that were reviewed today include: Dr Jacalyn Lefevre notes, prior MRI/ echo  Review of the above records today demonstrates: above   ASSESSMENT & PLAN:    1.  Ischemic CM The patient has an ischemic CM (EF 30%), NYHA Class III CHF, and CAD.  He is referred by Dr [name] for risk stratification of sudden death and consideration of ICD implantation.  At this time, he meets MADIT II/ SCD-HeFT criteria for ICD implantation for primary prevention of sudden death.  He has sinus bradycardia today.  I will therefore plan atrial lead placement also.  I have had a thorough discussion with the patient reviewing options.  The patient has had opportunity to ask questions and have them answered. The patient  and I have decided together through a shared decision making process to proceed with ICD at this time.  He has a narrow QRS and therefore does not meet criteria for CRT.   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  We discussed this at length today.  He is R handed and likes to shoot his rifle with the butt of the gun on the R side.  We will plan L sided implant.  ICD Criteria  Current LVEF:30%. Within 12 months prior to implant: Yes   Heart  failure history: Yes, Class III  Cardiomyopathy history: Yes, Ischemic Cardiomyopathy - Prior MI.  Atrial Fibrillation/Atrial Flutter: No.  Ventricular tachycardia history: No.  Cardiac arrest history: No.  History of syndromes with risk of sudden death: No.  Previous ICD: No.  Current ICD indication: Primary  PPM indication: Yes. Pacing type: Atrial. Greater than 40% RV pacing requirement anticipated. Indication: Sick Sinus Syndrome  Class I or II Bradycardia indication present: Yes  Beta Blocker therapy for 3 or more months: Yes, prescribed.   Ace Inhibitor/ARB therapy for 3 or more months: Yes, prescribed.   The patient and I have decided together through the [place] [employer] to proceed with dual chamber ICD at this time.  Risks, benefits, alternatives to ICD implantation were discussed in detail with the patient today. The patient  understands that the risks include but are not limited to bleeding, infection, pneumothorax, perforation, tamponade, vascular damage, renal failure, MI, stroke, death, inappropriate shocks, and lead dislodgement and wishes to proceed.    Thompson Grayer MD, [place] 01/30/2020 2:01 PM

## 2020-01-31 ENCOUNTER — Encounter (HOSPITAL_COMMUNITY): Payer: Self-pay | Admitting: Internal Medicine

## 2020-01-31 ENCOUNTER — Telehealth: Payer: Self-pay

## 2020-01-31 MED FILL — Lidocaine HCl Local Inj 1%: INTRAMUSCULAR | Qty: 60 | Status: AC

## 2020-01-31 NOTE — Telephone Encounter (Signed)
Spoke with pt, activity restrictions reviewed.  Pt reports outer dressing removed and site looks good, no drainage, redness or swelling.  Steri strips in tact.    Pt not currently enrolled in [employer] as there is an issue that [employer] rep is working with for device registration.  Advised pt once this is resolved we may contact him to send manual transmission

## 2020-01-31 NOTE — Telephone Encounter (Signed)
-----   Message from Shirley Friar, PA-C sent at 01/31/2020  8:06 AM EDT -----  Same day discharge from [facility] 6/22 - Sorry I forgot to send this one yesterday!

## 2020-02-01 ENCOUNTER — Other Ambulatory Visit: Payer: Self-pay | Admitting: *Deleted

## 2020-02-01 ENCOUNTER — Encounter: Payer: Self-pay | Admitting: *Deleted

## 2020-02-03 ENCOUNTER — Telehealth: Payer: Self-pay | Admitting: Internal Medicine

## 2020-02-03 DIAGNOSIS — Z87891 Personal history of nicotine dependence: Secondary | ICD-10-CM | POA: Diagnosis not present

## 2020-02-03 DIAGNOSIS — E785 Hyperlipidemia, unspecified: Secondary | ICD-10-CM | POA: Diagnosis not present

## 2020-02-03 DIAGNOSIS — I10 Essential (primary) hypertension: Secondary | ICD-10-CM | POA: Diagnosis not present

## 2020-02-03 DIAGNOSIS — R238 Other skin changes: Secondary | ICD-10-CM | POA: Diagnosis not present

## 2020-02-03 DIAGNOSIS — Z7901 Long term (current) use of anticoagulants: Secondary | ICD-10-CM | POA: Diagnosis not present

## 2020-02-03 DIAGNOSIS — Z95810 Presence of automatic (implantable) cardiac defibrillator: Secondary | ICD-10-CM | POA: Diagnosis not present

## 2020-02-03 NOTE — Telephone Encounter (Signed)
Patient paged on-call physician this evening with concerns about site of recently implanted ICD.   According to patient and his wife, the area around the incision has become red and he has noticed increasing drainage (though is unsure if serous or purulent). He also is noticing some "blistering" around the site. He has had no fevers, chills, malaise or other systemic symptoms.   He is currently in [place], [place] and I advised him to present for evaluation where his site can be evaluated for signs of CIED pocket / system infection. Patient planning to present to local ER.  Kimmberly Wisser K. Marletta Lor, MD

## 2020-02-05 NOTE — Telephone Encounter (Signed)
Pt is returning Amy phone call.

## 2020-02-05 NOTE — Telephone Encounter (Signed)
Attempted to reach pt to discuss icd implant site.  No answer, left detailed message with device clinic direct phone number, requested callback.  Also noted pt has mychart, indicated that he can send a picture of icd implant site through mychart to Dr. Rayann Heman.

## 2020-02-05 NOTE — Telephone Encounter (Signed)
Pt needs fu with EP or APP to check device implantation site this week [name] [name]

## 2020-02-05 NOTE — Telephone Encounter (Signed)
Spoke with pt, attempted to assist with sending a picture through mychart, it may still come through however he was having itnernet problems.  Pt reports he went to ED in [place] where he is vacationing.  The MD removed one layer of steri strips indicating that it appeared he was having allergic reaction.  Pt says he still has a layer of steri strips in place.  Schedule pt for device clinic wound check appt tomorrow at 3pm in case the picture is not received or the site still needs to be seen in person.

## 2020-02-05 NOTE — Telephone Encounter (Signed)
Called patient to inform him that per Amy, RN she thinks patient needs to come into [place] to have wound checked. Patient call and states he will be in tomorrow for apt.

## 2020-02-06 ENCOUNTER — Ambulatory Visit (INDEPENDENT_AMBULATORY_CARE_PROVIDER_SITE_OTHER): Payer: Medicare HMO | Admitting: Emergency Medicine

## 2020-02-06 ENCOUNTER — Other Ambulatory Visit: Payer: Self-pay

## 2020-02-06 DIAGNOSIS — I255 Ischemic cardiomyopathy: Secondary | ICD-10-CM

## 2020-02-06 DIAGNOSIS — Z95810 Presence of automatic (implantable) cardiac defibrillator: Secondary | ICD-10-CM

## 2020-02-06 NOTE — Patient Instructions (Signed)
Call the [facility] at [phone] if you notice worsening redness, swelling, drainage, or if you have fever or chills.

## 2020-02-07 NOTE — Progress Notes (Signed)
Wound check in clinic, added-on due to reported blistering at site of dressing. Steri-strips remain in place at incision. Pt reports that at visit to ED in [place], [place], on 02/03/20, one layer of steri-strips were removed by ED physician. Scant sanguineous drainage noted at site of blisters. Patient reports the appearance of the blistered area has improved since his ED visit. He has been dressing the blisters with antibiotic ointment and covering with gauze as per the ED physician's recommendation. No active drainage evident at incision; no other signs/symptoms of infection noted.   Discussed with Dr. Rayann Heman via phone. Per Dr. Jackalyn Lombard instruction, blistered area cleansed with saline-moistened gauze and antibiotic ointment and gauze dressing reapplied. Patient aware to avoid applying ointment to area covered by steri-strips. Reviewed signs/symptoms of infection and patient is aware to call if any are noted. Patient aware to keep wound check scheduled for 02/13/20.  Patient gave verbal permission to include the following photo:

## 2020-02-13 ENCOUNTER — Other Ambulatory Visit: Payer: Self-pay

## 2020-02-13 ENCOUNTER — Ambulatory Visit (INDEPENDENT_AMBULATORY_CARE_PROVIDER_SITE_OTHER): Payer: Medicare HMO | Admitting: Emergency Medicine

## 2020-02-13 DIAGNOSIS — I255 Ischemic cardiomyopathy: Secondary | ICD-10-CM

## 2020-02-13 LAB — CUP PACEART INCLINIC DEVICE CHECK
Battery Remaining Longevity: 122 mo
Battery Voltage: 3.1 V
Brady Statistic AP VP Percent: 0.03 %
Brady Statistic AP VS Percent: 51.45 %
Brady Statistic AS VP Percent: 0.03 %
Brady Statistic AS VS Percent: 48.49 %
Brady Statistic RA Percent Paced: 49.71 %
Brady Statistic RV Percent Paced: 0.06 %
Date Time Interrogation Session: 20210706133107
HighPow Impedance: 60 Ohm
Implantable Lead Implant Date: 20210622
Implantable Lead Implant Date: 20210622
Implantable Lead Location: 753859
Implantable Lead Location: 753860
Implantable Lead Model: 5076
Implantable Pulse Generator Implant Date: 20210622
Lead Channel Impedance Value: 342 Ohm
Lead Channel Impedance Value: 456 Ohm
Lead Channel Impedance Value: 513 Ohm
Lead Channel Pacing Threshold Amplitude: 0.5 V
Lead Channel Pacing Threshold Amplitude: 0.75 V
Lead Channel Pacing Threshold Pulse Width: 0.4 ms
Lead Channel Pacing Threshold Pulse Width: 0.4 ms
Lead Channel Sensing Intrinsic Amplitude: 4.375 mV
Lead Channel Sensing Intrinsic Amplitude: 5.125 mV
Lead Channel Sensing Intrinsic Amplitude: 8.75 mV
Lead Channel Sensing Intrinsic Amplitude: 9.875 mV
Lead Channel Setting Pacing Amplitude: 3.5 V
Lead Channel Setting Pacing Amplitude: 3.5 V
Lead Channel Setting Pacing Pulse Width: 0.4 ms
Lead Channel Setting Sensing Sensitivity: 0.3 mV

## 2020-02-13 NOTE — Progress Notes (Signed)
Wound check appointment. Steri-strips removed. Wound without redness or edema. Incision edges approximated, wound well healed, previosuly noted blistering from steri-strips has healed/ scabbed.  Device check completed with industry assistance.  Normal device function. Thresholds, sensing, and impedances consistent with implant measurements. Device programmed at 3.5V for extra safety margin until 3 month visit. Histogram distribution appropriate for patient and level of activity. No mode switches or ventricular arrhythmias noted.  PMT intervention programmed on.  RV minimum adapted amplitude progrrammed from 2.0v to clinic standard of 2.5v.   Patient educated about wound care, arm mobility, lifting restrictions, shock plan. ROV 05/08/20 with Dr. Rayann Heman.  Next scheduled remote transmission is 04/30/20.

## 2020-02-19 ENCOUNTER — Telehealth: Payer: Self-pay

## 2020-02-19 NOTE — Telephone Encounter (Signed)
LMOVM for pt to stop sending manuals transmission.

## 2020-02-23 ENCOUNTER — Other Ambulatory Visit: Payer: Self-pay | Admitting: Internal Medicine

## 2020-03-06 ENCOUNTER — Ambulatory Visit (INDEPENDENT_AMBULATORY_CARE_PROVIDER_SITE_OTHER): Payer: Medicare HMO | Admitting: *Deleted

## 2020-03-06 DIAGNOSIS — I240 Acute coronary thrombosis not resulting in myocardial infarction: Secondary | ICD-10-CM | POA: Diagnosis not present

## 2020-03-06 DIAGNOSIS — Z5181 Encounter for therapeutic drug level monitoring: Secondary | ICD-10-CM | POA: Diagnosis not present

## 2020-03-06 LAB — POCT INR: INR: 3 (ref 2.0–3.0)

## 2020-03-06 NOTE — Patient Instructions (Signed)
Continue warfarin 1 tablet daily  Recheck in 6 weeks.  

## 2020-04-08 ENCOUNTER — Other Ambulatory Visit: Payer: Self-pay

## 2020-04-09 ENCOUNTER — Other Ambulatory Visit: Payer: Self-pay

## 2020-04-09 MED ORDER — EZETIMIBE 10 MG PO TABS: 10.0000 mg | ORAL_TABLET | Freq: Every day | ORAL | 3 refills | Status: DC

## 2020-04-17 ENCOUNTER — Other Ambulatory Visit: Payer: Self-pay

## 2020-04-17 ENCOUNTER — Ambulatory Visit (INDEPENDENT_AMBULATORY_CARE_PROVIDER_SITE_OTHER): Payer: Medicare HMO | Admitting: Pharmacist

## 2020-04-17 DIAGNOSIS — Z5181 Encounter for therapeutic drug level monitoring: Secondary | ICD-10-CM

## 2020-04-17 DIAGNOSIS — I240 Acute coronary thrombosis not resulting in myocardial infarction: Secondary | ICD-10-CM

## 2020-04-17 LAB — POCT INR: INR: 2.5 (ref 2.0–3.0)

## 2020-04-17 NOTE — Patient Instructions (Signed)
Description   ?Continue warfarin 1 tablet daily ?Recheck in 6 weeks ?  ? ? ?

## 2020-04-30 ENCOUNTER — Ambulatory Visit (INDEPENDENT_AMBULATORY_CARE_PROVIDER_SITE_OTHER): Payer: Medicare HMO | Admitting: *Deleted

## 2020-04-30 DIAGNOSIS — I255 Ischemic cardiomyopathy: Secondary | ICD-10-CM | POA: Diagnosis not present

## 2020-04-30 LAB — CUP PACEART REMOTE DEVICE CHECK
Battery Remaining Longevity: 109 mo
Battery Voltage: 3.08 V
Brady Statistic AP VP Percent: 0.03 %
Brady Statistic AP VS Percent: 48.29 %
Brady Statistic AS VP Percent: 0.03 %
Brady Statistic AS VS Percent: 51.65 %
Brady Statistic RA Percent Paced: 46.73 %
Brady Statistic RV Percent Paced: 0.06 %
Date Time Interrogation Session: 20210921022724
HighPow Impedance: 65 Ohm
Implantable Lead Implant Date: 20210622
Implantable Lead Implant Date: 20210622
Implantable Lead Location: 753859
Implantable Lead Location: 753860
Implantable Lead Model: 5076
Implantable Pulse Generator Implant Date: 20210622
Lead Channel Impedance Value: 342 Ohm
Lead Channel Impedance Value: 456 Ohm
Lead Channel Impedance Value: 456 Ohm
Lead Channel Pacing Threshold Amplitude: 0.5 V
Lead Channel Pacing Threshold Amplitude: 0.625 V
Lead Channel Pacing Threshold Pulse Width: 0.4 ms
Lead Channel Pacing Threshold Pulse Width: 0.4 ms
Lead Channel Sensing Intrinsic Amplitude: 4.5 mV
Lead Channel Sensing Intrinsic Amplitude: 4.5 mV
Lead Channel Sensing Intrinsic Amplitude: 8.5 mV
Lead Channel Sensing Intrinsic Amplitude: 8.5 mV
Lead Channel Setting Pacing Amplitude: 3.25 V
Lead Channel Setting Pacing Amplitude: 3.5 V
Lead Channel Setting Pacing Pulse Width: 0.4 ms
Lead Channel Setting Sensing Sensitivity: 0.3 mV

## 2020-05-01 ENCOUNTER — Other Ambulatory Visit: Payer: Self-pay | Admitting: Internal Medicine

## 2020-05-01 NOTE — Progress Notes (Signed)
Remote ICD transmission.   

## 2020-05-08 ENCOUNTER — Other Ambulatory Visit: Payer: Self-pay

## 2020-05-08 ENCOUNTER — Encounter: Payer: Self-pay | Admitting: Internal Medicine

## 2020-05-08 ENCOUNTER — Ambulatory Visit: Payer: Medicare HMO | Admitting: Internal Medicine

## 2020-05-08 VITALS — BP 132/78 | HR 75 | Ht 70.0 in | Wt 225.8 lb

## 2020-05-08 DIAGNOSIS — I255 Ischemic cardiomyopathy: Secondary | ICD-10-CM

## 2020-05-08 DIAGNOSIS — I10 Essential (primary) hypertension: Secondary | ICD-10-CM | POA: Diagnosis not present

## 2020-05-08 LAB — CUP PACEART INCLINIC DEVICE CHECK
Battery Remaining Longevity: 122 mo
Battery Voltage: 3.07 V
Brady Statistic AP VP Percent: 0.03 %
Brady Statistic AP VS Percent: 49.28 %
Brady Statistic AS VP Percent: 0.03 %
Brady Statistic AS VS Percent: 50.67 %
Brady Statistic RA Percent Paced: 47.68 %
Brady Statistic RV Percent Paced: 0.06 %
Date Time Interrogation Session: 20210929171327
HighPow Impedance: 67 Ohm
Implantable Lead Implant Date: 20210622
Implantable Lead Implant Date: 20210622
Implantable Lead Location: 753859
Implantable Lead Location: 753860
Implantable Lead Model: 5076
Implantable Pulse Generator Implant Date: 20210622
Lead Channel Impedance Value: 361 Ohm
Lead Channel Impedance Value: 513 Ohm
Lead Channel Impedance Value: 532 Ohm
Lead Channel Pacing Threshold Amplitude: 0.5 V
Lead Channel Pacing Threshold Amplitude: 0.5 V
Lead Channel Pacing Threshold Pulse Width: 0.4 ms
Lead Channel Pacing Threshold Pulse Width: 0.4 ms
Lead Channel Sensing Intrinsic Amplitude: 10 mV
Lead Channel Sensing Intrinsic Amplitude: 4.25 mV
Lead Channel Sensing Intrinsic Amplitude: 5.625 mV
Lead Channel Sensing Intrinsic Amplitude: 8.5 mV
Lead Channel Setting Pacing Amplitude: 2 V
Lead Channel Setting Pacing Amplitude: 2.5 V
Lead Channel Setting Pacing Pulse Width: 0.4 ms
Lead Channel Setting Sensing Sensitivity: 0.3 mV

## 2020-05-08 NOTE — Patient Instructions (Signed)
Medication Instructions:  Your physician recommends that you continue on your current medications as directed. Please refer to the Current Medication list given to you today.  *If you need a refill on your cardiac medications before your next appointment, please call your pharmacy*  Lab Work: None ordered.  If you have labs (blood work) drawn today and your tests are completely normal, you will receive your results only by: Marland Kitchen MyChart Message (if you have MyChart) OR . A paper copy in the mail If you have any lab test that is abnormal or we need to change your treatment, we will call you to review the results.  Testing/Procedures: None ordered.  Follow-Up: At [facility], you and your health needs are our priority.  As part of our continuing mission to provide you with exceptional heart care, we have created designated Provider Care Teams.  These Care Teams include your primary Cardiologist (physician) and Advanced Practice Providers (APPs -  Physician Assistants and Nurse Practitioners) who all work together to provide you with the care you need, when you need it.  We recommend signing up for the patient portal called "MyChart".  Sign up information is provided on this After Visit Summary.  MyChart is used to connect with patients for Virtual Visits (Telemedicine).  Patients are able to view lab/test results, encounter notes, upcoming appointments, etc.  Non-urgent messages can be sent to your provider as well.   To learn more about what you can do with MyChart, go to [email or web address].    Your next appointment:   Your physician wants you to follow-up in: 1 year with [name] [name]. You will receive a reminder letter in the mail two months in advance. If you don't receive a letter, please call our office to schedule the follow-up appointment.   Other Instructions:   Dear Patient:  The [facility] clinic team is grateful for the opportunity to  partner with you in your implanted pacemaker/ defibrillator needs.  Our goals are to provide the most up-to-date and comprehensive care for you and your implanted device.    [name] [name] is a specially trained registered nurse who will follow your device heart failure diagnostics.  She will call you weekly following discharge and review your medications as well as your daily weights and breathing status.  During the call, she will review with you the results of your recent device interrogation.    We believe that this program will allow [place] to better care for you.  Multiple studies have shown that remote monitoring can help to decrease hospitalizations as well as improve quality of life and decrease your risks of dying.  Through this program, we hope to partner with you to optimize your cardiac care.   Once again, we would like to thank you for allowing [place] to participate in your care.  If you have any questions, please call the office at [phone].    Sincerely,   [facility] Team and Advanced Heart Failure Team

## 2020-05-08 NOTE — Progress Notes (Signed)
PCP: Biagio Borg, MD Primary Cardiologist: Dr [name] Primary EP: Dr Rayann Heman  [name] [name] is a 74 y.o. male who presents today for routine electrophysiology followup.  Since his ICD implant, the patient reports doing very well.  Today, he denies symptoms of palpitations, shortness of breath,  lower extremity edema, dizziness, presyncope, syncope, or ICD shocks.  He does have rare chest pain with moderate activity.  The patient is otherwise without complaint today.   Past Medical History:  Diagnosis Date  . ALLERGIC RHINITIS 10/31/2007   Qualifier: Diagnosis of  By: Jenny Reichmann MD, Hunt Oris   . Allergy   . CHOLECYSTECTOMY, HX OF 06/04/2007   Qualifier: Diagnosis of  By: Danny Lawless CMA, [place]    . COLONIC POLYPS, HX OF 10/31/2007   Qualifier: Diagnosis of  By: Jenny Reichmann MD, [place], HX OF 06/04/2007   Qualifier: Diagnosis of  By: [place], [place]    . CORONARY ARTERY DISEASE 06/04/2007   Qualifier: Diagnosis of  By: [place], [place]    . Diverticulosis   . Erectile dysfunction   . Eye twitch    right eye since chilhood   . HYPERLIPIDEMIA 06/04/2007   Qualifier: Diagnosis of  By: Danny Lawless CMA, [place]    . Hypersomnolence   . HYPERTENSION 06/04/2007   Qualifier: Diagnosis of  By: [place], [place]    . Hypothyroidism   . ISCHEMIC CARDIOMYOPATHY 06/04/2007   Qualifier: Diagnosis of  By: Danny Lawless CMA, [place]    . Myocardial infarction [facility])    per patient , his occurred in 1998   . Osteoarthritis   . PLMD (periodic limb movement disorder)   . Sleep apnea    no cpap' per patient , "i was checked for it and they said i didnt have it "   . Vertigo   . Vitamin D deficiency    Past Surgical History:  Procedure Laterality Date  . CHOLECYSTECTOMY    . COLONOSCOPY    . CORONARY ARTERY BYPASS GRAFT    . ICD IMPLANT N/A 01/30/2020   Procedure: ICD IMPLANT;  Surgeon: Thompson Grayer, MD;  Location: [facility];  Service: Cardiovascular;  Laterality:  N/A;  . KNEE ARTHROPLASTY Left 08/05/2017   Procedure: LEFT TOTAL KNEE ARTHROPLASTY WITH COMPUTER NAVIGATION;  Surgeon: Rod Can, MD;  Location: [facility];  Service: Orthopedics;  Laterality: Left;  Needs RNFA  . PENILE PROSTHESIS IMPLANT    . REPLACEMENT TOTAL KNEE  06/12/2013  . spinal cyst    . THORACOTOMY     left anterior; wound exploration and debridement  . TONSILLECTOMY AND ADENOIDECTOMY     age 38    ROS- all systems are reviewed and negative except as per HPI above  Current Outpatient Medications  Medication Sig Dispense Refill  . ezetimibe (ZETIA) 10 MG tablet Take 1 tablet (10 mg total) by mouth daily. 90 tablet 3  . meloxicam (MOBIC) 15 MG tablet Take 15 mg by mouth daily.     . metoprolol succinate (TOPROL-XL) 50 MG 24 hr tablet Take 1 tablet (50 mg total) by mouth daily. Take with or immediately following a meal. 90 tablet 3  . Multiple Vitamins-Minerals (MULTIVITAMIN WITH MINERALS) tablet Take 1 tablet by mouth daily. Centrum silver    . rosuvastatin (CRESTOR) 5 MG tablet Take 1 tablet (5 mg total) by mouth every other day. 45 tablet 3  . sacubitril-valsartan (ENTRESTO) 49-51 MG Take 1 tablet by mouth 2 (two)  times daily.    Marland Kitchen triamcinolone (NASACORT) 55 MCG/ACT AERO nasal inhaler Place 2 sprays into the nose daily. 3 Inhaler 3  . warfarin (COUMADIN) 5 MG tablet TAKE 1 TABLET EVERY DAY AT 4 PM 90 tablet 4   No current facility-administered medications for this visit.    Physical Exam: Vitals:   05/08/20 1355  BP: 132/78  Pulse: 75  SpO2: 92%  Weight: 225 lb 12.8 oz (102.4 kg)  Height: 5\' 10"  (1.778 m)    GEN- The patient is well appearing, alert and oriented x 3 today.   Head- normocephalic, atraumatic Eyes-  Sclera clear, conjunctiva pink Ears- hearing intact Oropharynx- clear Lungs- Clear to ausculation bilaterally, normal work of breathing Chest- ICD pocket is well healed Heart- Regular rate and rhythm, no murmurs, rubs or gallops, PMI not laterally  displaced GI- soft, NT, ND, + BS Extremities- no clubbing, cyanosis, or edema  ICD interrogation- reviewed in detail today,  See PACEART report  ekg tracing ordered today is personally reviewed and shows sinus with PACs/PVCs, anteroseptal infarct pattern  Wt Readings from Last 3 Encounters:  05/08/20 225 lb 12.8 oz (102.4 kg)  01/30/20 227 lb (103 kg)  12/18/19 225 lb (102.1 kg)    Assessment and Plan:  1.  Chronic systolic dysfunction/ CAD/ ischemic CM euvolemic today Stable canadian class II angina On an appropriate medical regimen Normal ICD function See [facility] report No changes today he is not device dependant today We will enroll to be followed in ICM device clinic  2. HTN Stable No change required today   Risks, benefits and potential toxicities for medications prescribed and/or refilled reviewed with patient today.   Thompson Grayer MD, [facility] 05/08/2020 2:17 PM

## 2020-05-30 ENCOUNTER — Other Ambulatory Visit: Payer: Self-pay

## 2020-05-30 ENCOUNTER — Ambulatory Visit (INDEPENDENT_AMBULATORY_CARE_PROVIDER_SITE_OTHER): Payer: Medicare HMO | Admitting: Pharmacist

## 2020-05-30 DIAGNOSIS — I240 Acute coronary thrombosis not resulting in myocardial infarction: Secondary | ICD-10-CM | POA: Diagnosis not present

## 2020-05-30 DIAGNOSIS — Z5181 Encounter for therapeutic drug level monitoring: Secondary | ICD-10-CM | POA: Diagnosis not present

## 2020-05-30 LAB — POCT INR: INR: 3.1 — AB (ref 2.0–3.0)

## 2020-05-30 NOTE — Patient Instructions (Signed)
Description   Have a little more greens this week and then continue warfarin 1 tablet daily.   Recheck in 6 weeks

## 2020-05-31 ENCOUNTER — Telehealth: Payer: Self-pay

## 2020-05-31 NOTE — Telephone Encounter (Signed)
-----   Message from Thompson Grayer, MD sent at 05/08/2020  2:24 PM EDT ----- Enroll in [facility] device clinic I discussed with him today and he is very interested.

## 2020-05-31 NOTE — Telephone Encounter (Signed)
Referred to ICM clinic by Dr Allred.   Attempted call to patient for ICM intro and left message for return call.   

## 2020-06-03 ENCOUNTER — Telehealth: Payer: Self-pay

## 2020-06-03 NOTE — Telephone Encounter (Signed)
Attempted return call.  ICM direct number left for return call.

## 2020-06-03 NOTE — Telephone Encounter (Signed)
Pt is returning call.  

## 2020-06-03 NOTE — Telephone Encounter (Signed)
Patient returning phone call. Having technological difficulties - Let him know Margarita Grizzle will call him back when she is able to. House phone number is [phone]

## 2020-06-03 NOTE — Telephone Encounter (Signed)
Spoke with patient for [facility] clinic enrollment per Dr Rayann Heman.  During ICM call patient reports his only complaint at this time is he is experiencing nausea that comes and goes which seems to correlate with taking Entresto.  He wanted to inform Dr [name] of the nausea and if this could be related to taking Entresto.  Advised will send a message to Dr Jacalyn Lefevre nurse, Fredia Beets, RN regarding nausea and explained she will follow up with him.    Message routed to Fredia Beets, RN for follow up.

## 2020-06-03 NOTE — Telephone Encounter (Signed)
Left message for pt to call.

## 2020-06-03 NOTE — Telephone Encounter (Signed)
Spoke with pt, he is waking at 3 am with a sick feeling to his stomach and a headache. It will eventually go away, he will go back to sleep and then when he wakes for the day, it will happen again. During the morning he will feel fine and then about midday he will get a weak feeling and he will have to stop work and sit down. He has not taken his blood pressure with the weak feeling. He feels it maybe related to the increase in the entresto. He reports that since the increase the symptoms were off and on but now it is occurring daily. He denies chest pain and has a little SOB. Discussed with dr [name], patient advised to reduce dose of entresto back to previous. He was asked to track bp and bring to his follow up appointment 06-17-20.

## 2020-06-03 NOTE — Telephone Encounter (Signed)
Patient returning call.

## 2020-06-03 NOTE — Telephone Encounter (Signed)
Spoke with patient and ICM intro given.  Patient agreed to monthly ICM call and is feeling well at this time.  Advised will scheduled 1st ICM remote transmission for 06/17/2020 which is same day as office visit with Dr [name].  Explained [phone] is the correct ICM number and is working now.  Encouraged to call if needed for fluid symptoms.  Patient reports his only complaint at this time is has been very nauseated since starting [place].  He says the nausea comes and goes.  Advised will send a message to Dr Jacalyn Lefevre nurse, Fredia Beets, RN regarding his concern and nausea symptom that he feels maybe related to [facility] and explained she will follow up with him.

## 2020-06-10 NOTE — Progress Notes (Signed)
HPI: FU CAD; history dates back to 1998 when he had a myocardial infarction. He subsequently underwent minimally invasive LIMA to his LAD. Nuclear study May 2016showed ejection fraction 40%, prior anterior apical infarct but no ischemia.Echocardiogram June 2020 showed ejection fraction 30 to 35% and possible LV apical thrombus though not definitive. Cardiac MRI recommended. There was mild left atrial enlargement, mild mitral regurgitation, trace aortic insufficiency. Cardiac MRI July 2020 showed ejection fraction approximately 30% and apical thrombus noted. Mild aortic and mitral regurgitation.Follow-up echocardiogram on medications November 2020 showed ejection fraction 30 to 35%, moderate left ventricular enlargement, biatrial enlargement, mild mitral, tricuspid and aortic insufficiency, mild aortic stenosis. Nuclear study January 2021 showed ejection fraction 32%, prior infarct but no ischemia.  Had ICD placed June 2021. Since I last saw him,he has dyspnea with more vigorous activities but not routine activities.  No orthopnea or PND.  Occasional mild pedal edema.  He denies chest pain.  Current Outpatient Medications  Medication Sig Dispense Refill   ezetimibe (ZETIA) 10 MG tablet Take 1 tablet (10 mg total) by mouth daily. 90 tablet 3   meloxicam (MOBIC) 15 MG tablet Take 15 mg by mouth daily.      metoprolol succinate (TOPROL-XL) 50 MG 24 hr tablet Take 1 tablet (50 mg total) by mouth daily. Take with or immediately following a meal. 90 tablet 3   Multiple Vitamins-Minerals (MULTIVITAMIN WITH MINERALS) tablet Take 1 tablet by mouth daily. Centrum silver     rosuvastatin (CRESTOR) 5 MG tablet Take 1 tablet (5 mg total) by mouth every other day. 45 tablet 3   sacubitril-valsartan (ENTRESTO) 49-51 MG Take 0.5 tablets by mouth 2 (two) times daily.     triamcinolone (NASACORT) 55 MCG/ACT AERO nasal inhaler Place 2 sprays into the nose daily. 3 Inhaler 3   warfarin (COUMADIN) 5  MG tablet TAKE 1 TABLET EVERY DAY AT 4 PM 90 tablet 4   No current facility-administered medications for this visit.     Past Medical History:  Diagnosis Date   ALLERGIC RHINITIS 10/31/2007   Qualifier: Diagnosis of  By: Jenny Reichmann MD, Hunt Oris    Allergy    CHOLECYSTECTOMY, HX OF 06/04/2007   Qualifier: Diagnosis of  By: Danny Lawless CMA, [place]     COLONIC POLYPS, HX OF 10/31/2007   Qualifier: Diagnosis of  By: Jenny Reichmann MD, [place] GRAFT, HX OF 06/04/2007   Qualifier: Diagnosis of  By: [place], [place]     CORONARY ARTERY DISEASE 06/04/2007   Qualifier: Diagnosis of  By: Danny Lawless CMA, [place]     Diverticulosis    Erectile dysfunction    Eye twitch    right eye since chilhood    HYPERLIPIDEMIA 06/04/2007   Qualifier: Diagnosis of  By: [place], [place]     Hypersomnolence    HYPERTENSION 06/04/2007   Qualifier: Diagnosis of  By: [place], [place]     Hypothyroidism    ISCHEMIC CARDIOMYOPATHY 06/04/2007   Qualifier: Diagnosis of  By: Danny Lawless CMA, [place]     Myocardial infarction ([place])    per patient , his occurred in 1998    Osteoarthritis    PLMD (periodic limb movement disorder)    Sleep apnea    no cpap' per patient , "i was checked for it and they said i didnt have it "    Vertigo    Vitamin D deficiency     Past Surgical History:  Procedure Laterality Date  CHOLECYSTECTOMY     COLONOSCOPY     CORONARY ARTERY BYPASS GRAFT     ICD IMPLANT N/A 01/30/2020   Procedure: ICD IMPLANT;  Surgeon: Thompson Grayer, MD;  Location: [facility];  Service: Cardiovascular;  Laterality: N/A;   KNEE ARTHROPLASTY Left 08/05/2017   Procedure: LEFT TOTAL KNEE ARTHROPLASTY WITH COMPUTER NAVIGATION;  Surgeon: Rod Can, MD;  Location: [facility];  Service: Orthopedics;  Laterality: Left;  Needs RNFA   PENILE PROSTHESIS IMPLANT     REPLACEMENT TOTAL KNEE  06/12/2013   spinal cyst     THORACOTOMY     left anterior; wound exploration  and debridement   TONSILLECTOMY AND ADENOIDECTOMY     age 74    Social History   Socioeconomic History   Marital status: Married    Spouse name: Not on file   Number of children: 3   Years of education: Not on file   Highest education level: Not on file  Occupational History   Occupation: truck Education administrator: [name]  Tobacco Use   Smoking status: Former Smoker    Packs/day: 4.00    Years: 17.00    Pack years: 68.00    Types: Cigarettes    Quit date: 05/07/1981    Years since quitting: 39.1   Smokeless tobacco: Never Used   Tobacco comment: Pt states that he would let most of them "burn" pt states that he used anywhere between 4-5PPD  Vaping Use   Vaping Use: Never used  Substance and Sexual Activity   Alcohol use: Yes    Alcohol/week: 0.0 standard drinks    Comment: rare   Drug use: No   Sexual activity: Not on file  Other Topics Concern   Not on file  Social History Narrative   Lives in [place] with spouse   Retired Administrator   Social Determinants of Radio broadcast assistant Strain:    Difficulty of Paying Living Expenses: Not on file  Food Insecurity:    Worried About Charity fundraiser in the Last Year: Not on file   [employer] of Food in the Last Year: Not on file  Transportation Needs:    Lack of Transportation (Medical): Not on file   Lack of Transportation (Non-Medical): Not on file  Physical Activity:    Days of Exercise per Week: Not on file   Minutes of Exercise per Session: Not on file  Stress:    Feeling of Stress : Not on file  Social Connections:    Frequency of Communication with Friends and Family: Not on file   Frequency of Social Gatherings with Friends and Family: Not on file   Attends Religious Services: Not on file   Active Member of Clubs or Organizations: Not on file   Attends Archivist Meetings: Not on file   Marital Status: Not on file  Intimate Partner Violence:     Fear of Current or Ex-Partner: Not on file   Emotionally Abused: Not on file   Physically Abused: Not on file   Sexually Abused: Not on file    Family History  Problem Relation Age of Onset   Stomach cancer Mother    Lung cancer Father    Heart disease Brother        first MI at 67yo, now 16 for transplant list/ ICM   Colon cancer Maternal Grandfather    Esophageal cancer Neg Hx    Pancreatic cancer Neg Hx  Prostate cancer Neg Hx    Rectal cancer Neg Hx     ROS: He describes pain in his right lower extremity from the knee down.  Typically occurs worse with sitting still.  He has some numbness in his foot as well.  He does not have pain when he exercises.  No fevers or chills, productive cough, hemoptysis, dysphasia, odynophagia, melena, hematochezia, dysuria, hematuria, rash, seizure activity, orthopnea, PND, pedal edema, claudication. Remaining systems are negative.  Physical Exam: Well-developed well-nourished in no acute distress.  Skin is warm and dry.  HEENT is normal.  Neck is supple.  Chest is clear to auscultation with normal expansion.  Cardiovascular exam is regular rate and rhythm.  Abdominal exam nontender or distended. No masses palpated. Extremities show no edema. neuro grossly intact  A/P  1 coronary artery disease status post coronary artery bypass and graft-patient denies chest pain.  Continue beta-blocker and statin.  No aspirin given need for Coumadin.  2 ischemic cardiomyopathy-continue Entresto and beta-blocker.  Patient is now status post ICD.  3 ICD-Per electrophysiology.  4 hypertension-blood pressure controlled.  Continue present medications and follow.  5 hyperlipidemia-continue statin.  Note he did not tolerate higher doses previously.  6 history of LV apical thrombus-continue Coumadin with goal INR 2-3.  7 right lower extremity pain-symptoms do not sound likely to be vascular.  They occur more with sitting and improved with  walking.  He will follow up with orthopedics.  If no etiology identified could consider ABIs in the future.   Kirk Ruths, MD

## 2020-06-17 ENCOUNTER — Ambulatory Visit (INDEPENDENT_AMBULATORY_CARE_PROVIDER_SITE_OTHER): Payer: Medicare HMO

## 2020-06-17 ENCOUNTER — Other Ambulatory Visit: Payer: Self-pay

## 2020-06-17 ENCOUNTER — Ambulatory Visit: Payer: Medicare HMO | Admitting: Cardiology

## 2020-06-17 ENCOUNTER — Encounter: Payer: Self-pay | Admitting: Cardiology

## 2020-06-17 VITALS — BP 125/71 | HR 67 | Temp 97.2°F | Ht 70.0 in | Wt 208.6 lb

## 2020-06-17 DIAGNOSIS — I240 Acute coronary thrombosis not resulting in myocardial infarction: Secondary | ICD-10-CM

## 2020-06-17 DIAGNOSIS — E7800 Pure hypercholesterolemia, unspecified: Secondary | ICD-10-CM | POA: Diagnosis not present

## 2020-06-17 DIAGNOSIS — I255 Ischemic cardiomyopathy: Secondary | ICD-10-CM

## 2020-06-17 DIAGNOSIS — Z95810 Presence of automatic (implantable) cardiac defibrillator: Secondary | ICD-10-CM | POA: Diagnosis not present

## 2020-06-17 DIAGNOSIS — I2510 Atherosclerotic heart disease of native coronary artery without angina pectoris: Secondary | ICD-10-CM

## 2020-06-17 DIAGNOSIS — I10 Essential (primary) hypertension: Secondary | ICD-10-CM | POA: Diagnosis not present

## 2020-06-17 NOTE — Patient Instructions (Signed)

## 2020-06-17 NOTE — Progress Notes (Signed)
EPIC Encounter for ICM Monitoring  Patient Name: [name] [name] is a 74 y.o. male Date: 06/17/2020 Primary Care Physican: Biagio Borg, MD Primary Cardiologist: [name] Electrophysiologist: Allred 06/17/2020 Weight:  Unknown (does not weigh at home)       1st ICM remote transmission. Heart Failure questions reviewed.  Pt asymptomatic for fluid symptoms.  He reports he has swelling in leg but does go down at night time.  [facility] told [facility] it may be arthritis.  He will discuss with Dr [name] at today's appointment to ask if it may be a circulation problem.   Optivol thoracic impedance normal.   No diuretic  Recommendations:    Encouraged to call if experiencing fluid symptoms.  Follow-up plan: ICM clinic phone appointment on 07/23/2020.   91 day device clinic remote transmission 07/30/2020.    EP/Cardiology Office Visits: 06/17/2020 with Dr. [name].    Copy of ICM check sent to Dr. Rayann Heman.   3 month ICM trend: 06/17/2020    1 Year ICM trend:       Rosalene Billings, RN 06/17/2020 11:30 AM

## 2020-07-11 ENCOUNTER — Ambulatory Visit (INDEPENDENT_AMBULATORY_CARE_PROVIDER_SITE_OTHER): Payer: Medicare HMO | Admitting: *Deleted

## 2020-07-11 DIAGNOSIS — Z5181 Encounter for therapeutic drug level monitoring: Secondary | ICD-10-CM

## 2020-07-11 DIAGNOSIS — I240 Acute coronary thrombosis not resulting in myocardial infarction: Secondary | ICD-10-CM | POA: Diagnosis not present

## 2020-07-11 LAB — POCT INR: INR: 4.2 — AB (ref 2.0–3.0)

## 2020-07-11 NOTE — Patient Instructions (Signed)
Hold warfarin tonight then decrease dose to 1 tablet daily except 1/2 tablet on Fridays Recheck in 3 weeks

## 2020-07-23 ENCOUNTER — Ambulatory Visit (INDEPENDENT_AMBULATORY_CARE_PROVIDER_SITE_OTHER): Payer: Medicare HMO

## 2020-07-23 DIAGNOSIS — Z95810 Presence of automatic (implantable) cardiac defibrillator: Secondary | ICD-10-CM

## 2020-07-23 DIAGNOSIS — I255 Ischemic cardiomyopathy: Secondary | ICD-10-CM

## 2020-07-24 NOTE — Progress Notes (Signed)
EPIC Encounter for ICM Monitoring  Patient Name: [name] [name] is a 74 y.o. male Date: 07/24/2020 Primary Care Physican: Biagio Borg, MD Primary Cardiologist: [name] Electrophysiologist: Allred 06/17/2020 Weight:  Unknown (does not weigh at home)                                                           Heart Failure questions reviewed.  Pt asymptomatic for fluid symptoms.     Optivol thoracic impedance normal.   No diuretic  Recommendations:    Encouraged to call if experiencing fluid symptoms.  Follow-up plan: ICM clinic phone appointment on 08/27/2019.   91 day device clinic remote transmission 07/30/2020.    EP/Cardiology Office Visits: 06/17/2020 with Dr. [name].    Copy of ICM check sent to Dr. Rayann Heman.    3 month ICM trend: 07/23/2020    1 Year ICM trend:       Rosalene Billings, RN 07/24/2020 4:59 PM

## 2020-07-30 ENCOUNTER — Ambulatory Visit (INDEPENDENT_AMBULATORY_CARE_PROVIDER_SITE_OTHER): Payer: Medicare HMO | Admitting: *Deleted

## 2020-07-30 ENCOUNTER — Ambulatory Visit (INDEPENDENT_AMBULATORY_CARE_PROVIDER_SITE_OTHER): Payer: Medicare HMO

## 2020-07-30 DIAGNOSIS — I255 Ischemic cardiomyopathy: Secondary | ICD-10-CM

## 2020-07-30 DIAGNOSIS — Z5181 Encounter for therapeutic drug level monitoring: Secondary | ICD-10-CM | POA: Diagnosis not present

## 2020-07-30 DIAGNOSIS — I240 Acute coronary thrombosis not resulting in myocardial infarction: Secondary | ICD-10-CM

## 2020-07-30 LAB — CUP PACEART REMOTE DEVICE CHECK
Battery Remaining Longevity: 122 mo
Battery Voltage: 3.05 V
Brady Statistic AP VP Percent: 0.02 %
Brady Statistic AP VS Percent: 55.41 %
Brady Statistic AS VP Percent: 0.01 %
Brady Statistic AS VS Percent: 44.56 %
Brady Statistic RA Percent Paced: 54.39 %
Brady Statistic RV Percent Paced: 0.03 %
Date Time Interrogation Session: 20211221001702
HighPow Impedance: 77 Ohm
Implantable Lead Implant Date: 20210622
Implantable Lead Implant Date: 20210622
Implantable Lead Location: 753859
Implantable Lead Location: 753860
Implantable Lead Model: 5076
Implantable Pulse Generator Implant Date: 20210622
Lead Channel Impedance Value: 361 Ohm
Lead Channel Impedance Value: 513 Ohm
Lead Channel Impedance Value: 513 Ohm
Lead Channel Pacing Threshold Amplitude: 0.625 V
Lead Channel Pacing Threshold Amplitude: 0.75 V
Lead Channel Pacing Threshold Pulse Width: 0.4 ms
Lead Channel Pacing Threshold Pulse Width: 0.4 ms
Lead Channel Sensing Intrinsic Amplitude: 4.875 mV
Lead Channel Sensing Intrinsic Amplitude: 4.875 mV
Lead Channel Sensing Intrinsic Amplitude: 8.125 mV
Lead Channel Sensing Intrinsic Amplitude: 8.125 mV
Lead Channel Setting Pacing Amplitude: 1.5 V
Lead Channel Setting Pacing Amplitude: 2.5 V
Lead Channel Setting Pacing Pulse Width: 0.4 ms
Lead Channel Setting Sensing Sensitivity: 0.3 mV

## 2020-07-30 LAB — POCT INR: INR: 2.2 (ref 2.0–3.0)

## 2020-07-30 NOTE — Patient Instructions (Signed)
Continue warfarin 1 tablet daily except 1/2 tablet on Fridays Recheck in 4 weeks 

## 2020-08-06 ENCOUNTER — Other Ambulatory Visit: Payer: Self-pay | Admitting: Cardiology

## 2020-08-06 DIAGNOSIS — I2510 Atherosclerotic heart disease of native coronary artery without angina pectoris: Secondary | ICD-10-CM

## 2020-08-14 NOTE — Progress Notes (Signed)
Remote ICD transmission.   

## 2020-08-15 ENCOUNTER — Telehealth: Payer: Self-pay | Admitting: Cardiology

## 2020-08-15 MED ORDER — LOSARTAN POTASSIUM 100 MG PO TABS: 100.0000 mg | ORAL_TABLET | Freq: Every day | ORAL | 3 refills | Status: DC

## 2020-08-15 MED ORDER — VALSARTAN 320 MG PO TABS: 320.0000 mg | ORAL_TABLET | Freq: Every day | ORAL | Status: DC

## 2020-08-15 NOTE — Telephone Encounter (Signed)
Pt c/o medication issue: sacubitril-valsartan (ENTRESTO) 49-51 MG [[id number]]   1. Name of Medication:   2. How are you currently taking this medication (dosage and times per day)?  sacubitril-valsartan (ENTRESTO) 49-51 MG [id number]    3. Are you having a reaction (difficulty breathing--STAT)? no  4. What is your medication issue? Pt called in stated this med was changed to 1/2 pill in the morning and 1/2 pill at night.  He stated he was getting a headache and a little SOB .  He stated he stopped taking it 3 days ago.  He stated he feel so much better

## 2020-08-15 NOTE — Telephone Encounter (Signed)
Spoke with pt, aware he is to restart losartan 100 mg once daily. He will call when a refill is needed as he has losartan left.

## 2020-08-26 ENCOUNTER — Ambulatory Visit (INDEPENDENT_AMBULATORY_CARE_PROVIDER_SITE_OTHER): Payer: Medicare HMO

## 2020-08-26 DIAGNOSIS — Z95810 Presence of automatic (implantable) cardiac defibrillator: Secondary | ICD-10-CM

## 2020-08-26 DIAGNOSIS — I255 Ischemic cardiomyopathy: Secondary | ICD-10-CM | POA: Diagnosis not present

## 2020-08-26 NOTE — Progress Notes (Signed)
EPIC Encounter for ICM Monitoring  Patient Name: [name] [name] is a 75 y.o. male Date: 08/26/2020 Primary Care Physican: Biagio Borg, MD Primary Cardiologist:[place] Electrophysiologist:Allred Weight: Unknown (does not weigh at home)   Spoke with patient and he reports feeling fine.  He denies any fluid symptoms.  Discussed diet.  He uses no salt seasoning but does eat out frequently.   Optivol thoracic impedance suggesting possible fluid accumulation since 08/19/2020.  No diuretic  Recommendations: Discussed salt intake.   Follow-up plan: ICM clinic phone appointment on2/21/2021. 91 day device clinic remote transmission 10/29/2020.   EP/Cardiology Office Visits:Recall for 11/10/2020 with Oda Kilts, PA.  Recall 12/14/2020 with Dr [name].   Copy of ICM check sent to Dr.Allred and Dr [name].    3 month ICM trend: 08/26/2020.    1 Year ICM trend:       Rosalene Billings, RN 08/26/2020 10:18 AM

## 2020-08-30 ENCOUNTER — Other Ambulatory Visit: Payer: Self-pay

## 2020-08-30 ENCOUNTER — Ambulatory Visit (INDEPENDENT_AMBULATORY_CARE_PROVIDER_SITE_OTHER): Payer: Medicare HMO | Admitting: *Deleted

## 2020-08-30 DIAGNOSIS — I240 Acute coronary thrombosis not resulting in myocardial infarction: Secondary | ICD-10-CM | POA: Diagnosis not present

## 2020-08-30 DIAGNOSIS — Z5181 Encounter for therapeutic drug level monitoring: Secondary | ICD-10-CM

## 2020-08-30 LAB — POCT INR: INR: 3.2 — AB (ref 2.0–3.0)

## 2020-08-30 NOTE — Patient Instructions (Signed)
Hold warfarin tonight then resume 1 tablet daily except 1/2 tablet on Fridays Recheck in 4 weeks

## 2020-09-11 DIAGNOSIS — H524 Presbyopia: Secondary | ICD-10-CM | POA: Diagnosis not present

## 2020-09-11 DIAGNOSIS — H52203 Unspecified astigmatism, bilateral: Secondary | ICD-10-CM | POA: Diagnosis not present

## 2020-09-11 DIAGNOSIS — H25813 Combined forms of age-related cataract, bilateral: Secondary | ICD-10-CM | POA: Diagnosis not present

## 2020-09-11 DIAGNOSIS — H5203 Hypermetropia, bilateral: Secondary | ICD-10-CM | POA: Diagnosis not present

## 2020-09-30 ENCOUNTER — Ambulatory Visit (INDEPENDENT_AMBULATORY_CARE_PROVIDER_SITE_OTHER): Payer: Medicare HMO

## 2020-09-30 ENCOUNTER — Ambulatory Visit (INDEPENDENT_AMBULATORY_CARE_PROVIDER_SITE_OTHER): Payer: Medicare HMO | Admitting: *Deleted

## 2020-09-30 DIAGNOSIS — Z95810 Presence of automatic (implantable) cardiac defibrillator: Secondary | ICD-10-CM

## 2020-09-30 DIAGNOSIS — I240 Acute coronary thrombosis not resulting in myocardial infarction: Secondary | ICD-10-CM

## 2020-09-30 DIAGNOSIS — Z5181 Encounter for therapeutic drug level monitoring: Secondary | ICD-10-CM

## 2020-09-30 DIAGNOSIS — I255 Ischemic cardiomyopathy: Secondary | ICD-10-CM | POA: Diagnosis not present

## 2020-09-30 LAB — POCT INR: INR: 2.5 (ref 2.0–3.0)

## 2020-09-30 NOTE — Patient Instructions (Signed)
Continue warfarin 1 tablet daily except 1/2 tablet on Fridays Recheck in 4 weeks

## 2020-10-01 ENCOUNTER — Other Ambulatory Visit: Payer: Self-pay

## 2020-10-02 ENCOUNTER — Encounter: Payer: Self-pay | Admitting: Internal Medicine

## 2020-10-02 ENCOUNTER — Ambulatory Visit (INDEPENDENT_AMBULATORY_CARE_PROVIDER_SITE_OTHER): Payer: Medicare HMO | Admitting: Internal Medicine

## 2020-10-02 VITALS — BP 118/70 | HR 64 | Ht 70.0 in | Wt 224.0 lb

## 2020-10-02 DIAGNOSIS — J309 Allergic rhinitis, unspecified: Secondary | ICD-10-CM

## 2020-10-02 DIAGNOSIS — E559 Vitamin D deficiency, unspecified: Secondary | ICD-10-CM | POA: Diagnosis not present

## 2020-10-02 DIAGNOSIS — R739 Hyperglycemia, unspecified: Secondary | ICD-10-CM | POA: Diagnosis not present

## 2020-10-02 DIAGNOSIS — E538 Deficiency of other specified B group vitamins: Secondary | ICD-10-CM | POA: Diagnosis not present

## 2020-10-02 DIAGNOSIS — N320 Bladder-neck obstruction: Secondary | ICD-10-CM | POA: Diagnosis not present

## 2020-10-02 DIAGNOSIS — Z0001 Encounter for general adult medical examination with abnormal findings: Secondary | ICD-10-CM | POA: Diagnosis not present

## 2020-10-02 DIAGNOSIS — E7800 Pure hypercholesterolemia, unspecified: Secondary | ICD-10-CM

## 2020-10-02 DIAGNOSIS — N529 Male erectile dysfunction, unspecified: Secondary | ICD-10-CM

## 2020-10-02 DIAGNOSIS — I10 Essential (primary) hypertension: Secondary | ICD-10-CM | POA: Diagnosis not present

## 2020-10-02 DIAGNOSIS — R202 Paresthesia of skin: Secondary | ICD-10-CM | POA: Insufficient documentation

## 2020-10-02 LAB — CBC WITH DIFFERENTIAL/PLATELET
Basophils Absolute: 0.1 10*3/uL (ref 0.0–0.1)
Basophils Relative: 1.3 % (ref 0.0–3.0)
Eosinophils Absolute: 0.2 10*3/uL (ref 0.0–0.7)
Eosinophils Relative: 3.2 % (ref 0.0–5.0)
HCT: 45 % (ref 39.0–52.0)
Hemoglobin: 15.1 g/dL (ref 13.0–17.0)
Lymphocytes Relative: 28.4 % (ref 12.0–46.0)
Lymphs Abs: 1.6 10*3/uL (ref 0.7–4.0)
MCHC: 33.7 g/dL (ref 30.0–36.0)
MCV: 93 fl (ref 78.0–100.0)
Monocytes Absolute: 0.6 10*3/uL (ref 0.1–1.0)
Monocytes Relative: 10.2 % (ref 3.0–12.0)
Neutro Abs: 3.3 10*3/uL (ref 1.4–7.7)
Neutrophils Relative %: 56.9 % (ref 43.0–77.0)
Platelets: 123 10*3/uL — ABNORMAL LOW (ref 150.0–400.0)
RBC: 4.83 Mil/uL (ref 4.22–5.81)
RDW: 14 % (ref 11.5–15.5)
WBC: 5.8 10*3/uL (ref 4.0–10.5)

## 2020-10-02 LAB — BASIC METABOLIC PANEL
BUN: 21 mg/dL (ref 6–23)
CO2: 32 mEq/L (ref 19–32)
Calcium: 9.6 mg/dL (ref 8.4–10.5)
Chloride: 100 mEq/L (ref 96–112)
Creatinine, Ser: 1.11 mg/dL (ref 0.40–1.50)
GFR: 64.96 mL/min (ref >60.00)
Glucose, Bld: 91 mg/dL (ref 70–99)
Potassium: 4.7 mEq/L (ref 3.5–5.1)
Sodium: 137 mEq/L (ref 135–145)

## 2020-10-02 LAB — HEPATIC FUNCTION PANEL
ALT: 18 U/L (ref 0–53)
AST: 20 U/L (ref 0–37)
Albumin: 4.3 g/dL (ref 3.5–5.2)
Alkaline Phosphatase: 49 U/L (ref 39–117)
Bilirubin, Direct: 0.2 mg/dL (ref 0.0–0.3)
Total Bilirubin: 0.8 mg/dL (ref 0.2–1.2)
Total Protein: 7.1 g/dL (ref 6.0–8.3)

## 2020-10-02 LAB — URINALYSIS, ROUTINE W REFLEX MICROSCOPIC
Bilirubin Urine: NEGATIVE
Hgb urine dipstick: NEGATIVE
Ketones, ur: NEGATIVE
Leukocytes,Ua: NEGATIVE
Nitrite: NEGATIVE
RBC / HPF: NONE SEEN (ref 0–?)
Specific Gravity, Urine: >=1.03 — AB (ref 1.000–1.030)
Total Protein, Urine: NEGATIVE
Urine Glucose: NEGATIVE
Urobilinogen, UA: 1 (ref 0.0–1.0)
pH: 5.5 (ref 5.0–8.0)

## 2020-10-02 LAB — LIPID PANEL
Cholesterol: 149 mg/dL (ref 0–200)
HDL: 40.5 mg/dL (ref >39.00)
LDL Cholesterol: 77 mg/dL (ref 0–99)
NonHDL: 108.21
Total CHOL/HDL Ratio: 4
Triglycerides: 158 mg/dL — ABNORMAL HIGH (ref 0.0–149.0)
VLDL: 31.6 mg/dL (ref 0.0–40.0)

## 2020-10-02 LAB — VITAMIN D 25 HYDROXY (VIT D DEFICIENCY, FRACTURES): VITD: 43.6 ng/mL (ref 30.00–100.00)

## 2020-10-02 LAB — TSH: TSH: 4.24 u[IU]/mL (ref 0.35–4.50)

## 2020-10-02 LAB — PSA: PSA: 1.14 ng/mL (ref 0.10–4.00)

## 2020-10-02 LAB — HEMOGLOBIN A1C: Hgb A1c MFr Bld: 5.9 % (ref 4.6–6.5)

## 2020-10-02 LAB — VITAMIN B12: Vitamin B-12: 377 pg/mL (ref 211–911)

## 2020-10-02 NOTE — Progress Notes (Signed)
Patient ID: [name] [name], male   DOB: 05-06-1946, 75 y.o.   MRN: [id number]         Chief Complaint:: wellness exam and hyperglycemia, allergies, right leg paresthesias and gait difficulty       HPI:  [name] [name] is a 75 y.o. male here for wellness exam; had eye exam with neg report, o/w up to date except colonoscopy due July 2022                        Also did have a fall off a 3 ft ladder backwards in to a bush and no injury yesterday.  Also having recurrent headaches severe intermittent for several months, worse to first wake up in the AM, located at both eys and forehead and top of head, like he is going to bust, pressure like. Can also have HA later in the day with stress .  Has been checked of OSA and not present.Marland Kitchen No phono or photohpobia, no n/v, typically last 1 hr. Coffee in the am seems to help, but aggrevation will bring it back on.  Has similar years ago, but asympt for some years, now back again. Using the nasal steroid at night might help for the HA in the am, and lots of nasal congestion, sometimes wakes him up during the night as well, ahs some gagging on congestion and cant breathe for a bit until clears the mucous.  No fever.   Also has bilateral chronic knee pain s/p bilateral replamements, but pain tends to come on with more walking and standing, and driving.  Not dangerous to drive but right and foot get painful to the outside and has numbness only to the last 3 toes and outside of the foot.  Overall constant nubmenss but pain mild to mod intemrittent, better to use the cruise control , and denies lower back pain.   BP at home has been 140/81, 154/89 recently. HR in low 60s.  BP has always run mild high for many years.   Wt Readings from Last 3 Encounters:  10/02/20 224 lb (101.6 kg)  06/17/20 208 lb 9.6 oz (94.6 kg)  05/08/20 225 lb 12.8 oz (102.4 kg)   BP Readings from Last 3 Encounters:  10/02/20 118/70  06/17/20 125/71  05/08/20 132/78   Immunization  History  Administered Date(s) Administered  . Influenza Split 05/10/2012, 05/07/2015  . Influenza Whole 05/10/2008  . Influenza, High Dose Seasonal PF 06/09/2010, 08/07/2011, 05/27/2016, 06/22/2017, 06/15/2018, 04/12/2020  . Influenza,inj,Quad PF,6+ Mos 05/11/2014  . Influenza,inj,quad, With Preservative 05/25/2017  . Influenza-Unspecified 05/11/2007, 05/24/2008, 05/31/2009, 05/11/2012, 05/18/2013, 05/10/2014, 05/05/2015, 05/10/2017, 07/11/2019, 04/12/2020  . Moderna Sars-Covid-2 Vaccination 10/17/2019, 11/14/2019, 08/01/2020  . Pneumococcal Conjugate-13 12/07/2014, 05/14/2015  . Pneumococcal Polysaccharide-23 10/31/2007  . Td 10/29/2008  . Tdap 03/22/2019  . Zoster Recombinat (Shingrix) 08/10/2017, 10/08/2017  There are no preventive care reminders to display for this patient.    Past Medical History:  Diagnosis Date  . ALLERGIC RHINITIS 10/31/2007   Qualifier: Diagnosis of  By: Jenny Reichmann MD, Hunt Oris   . Allergy   . CHOLECYSTECTOMY, HX OF 06/04/2007   Qualifier: Diagnosis of  By: Danny Lawless CMA, [place]    . COLONIC POLYPS, HX OF 10/31/2007   Qualifier: Diagnosis of  By: Jenny Reichmann MD, [place], HX OF 06/04/2007   Qualifier: Diagnosis of  By: [place], [place]    . CORONARY ARTERY DISEASE 06/04/2007   Qualifier: Diagnosis  of  By: Danny Lawless CMA, [place]    . Diverticulosis   . Erectile dysfunction   . Eye twitch    right eye since chilhood   . HYPERLIPIDEMIA 06/04/2007   Qualifier: Diagnosis of  By: Danny Lawless CMA, [place]    . Hypersomnolence   . HYPERTENSION 06/04/2007   Qualifier: Diagnosis of  By: [place], [place]    . Hypothyroidism   . ISCHEMIC CARDIOMYOPATHY 06/04/2007   Qualifier: Diagnosis of  By: Danny Lawless CMA, [place]    . Myocardial infarction [facility])    per patient , his occurred in 1998   . Osteoarthritis   . PLMD (periodic limb movement disorder)   . Sleep apnea    no cpap' per patient , "i was checked for it and they said i didnt have it "   .  Vertigo   . Vitamin D deficiency    Past Surgical History:  Procedure Laterality Date  . CHOLECYSTECTOMY    . COLONOSCOPY    . CORONARY ARTERY BYPASS GRAFT    . ICD IMPLANT N/A 01/30/2020   Procedure: ICD IMPLANT;  Surgeon: Thompson Grayer, MD;  Location: [facility];  Service: Cardiovascular;  Laterality: N/A;  . KNEE ARTHROPLASTY Left 08/05/2017   Procedure: LEFT TOTAL KNEE ARTHROPLASTY WITH COMPUTER NAVIGATION;  Surgeon: Rod Can, MD;  Location: [facility];  Service: Orthopedics;  Laterality: Left;  Needs RNFA  . PENILE PROSTHESIS IMPLANT    . REPLACEMENT TOTAL KNEE  06/12/2013  . spinal cyst    . THORACOTOMY     left anterior; wound exploration and debridement  . TONSILLECTOMY AND ADENOIDECTOMY     age 81    reports that he quit smoking about 39 years ago. His smoking use included cigarettes. He has a 68.00 pack-year smoking history. He has never used smokeless tobacco. He reports current alcohol use. He reports that he does not use drugs. family history includes Colon cancer in his maternal grandfather; Heart disease in his brother; Lung cancer in his father; Stomach cancer in his mother. Allergies  Allergen Reactions  . Ace Inhibitors Cough  . Rosuvastatin Other (See Comments)    Muscle pains  . Statins Other (See Comments)    Muscle pains   Current Outpatient Medications on File Prior to Visit  Medication Sig Dispense Refill  . ezetimibe (ZETIA) 10 MG tablet Take 1 tablet (10 mg total) by mouth daily. 90 tablet 3  . meloxicam (MOBIC) 15 MG tablet Take 15 mg by mouth daily.     . metoprolol succinate (TOPROL-XL) 50 MG 24 hr tablet TAKE 1 TABLET BY MOUTH DAILY. TAKE WITH OR IMMEDIATELY FOLLOWING A MEAL. 90 tablet 3  . Multiple Vitamins-Minerals (MULTIVITAMIN WITH MINERALS) tablet Take 1 tablet by mouth daily. Centrum silver    . rosuvastatin (CRESTOR) 5 MG tablet TAKE 1 TABLET EVERY OTHER DAY 45 tablet 3  . sacubitril-valsartan (ENTRESTO) 49-51 MG Take 0.5 tablets by  mouth 2 (two) times daily.    Marland Kitchen triamcinolone (NASACORT) 55 MCG/ACT AERO nasal inhaler Place 2 sprays into the nose daily. 3 Inhaler 3  . valsartan (DIOVAN) 320 MG tablet Take 1 tablet (320 mg total) by mouth daily.    Marland Kitchen warfarin (COUMADIN) 5 MG tablet TAKE 1 TABLET EVERY DAY AT 4 PM 90 tablet 4   No current facility-administered medications on file prior to visit.        ROS:  All others reviewed and negative.  Objective        PE:  BP 118/70   Pulse 64   Ht 5\' 10"  (1.778 m)   Wt 224 lb (101.6 kg)   SpO2 97%   BMI 32.14 kg/m                 Constitutional: Pt appears in NAD               HENT: Head: NCAT.                Right Ear: External ear normal.                 Left Ear: External ear normal.                Eyes: . Pupils are equal, round, and reactive to light. Conjunctivae and EOM are normal               Nose: without d/c or deformity               Neck: Neck supple. Gross normal ROM               Cardiovascular: Normal rate and regular rhythm.                 Pulmonary/Chest: Effort normal and breath sounds without rales or wheezing.                Abd:  Soft, NT, ND, + BS, no organomegaly               Neurological: Pt is alert. At baseline orientation, motor grossly intact, gait antalgic, decresaed sensation to right lateral foot to LT               Skin: Skin is warm. No rashes, no other new lesions, LE edema - none               Psychiatric: Pt behavior is normal without agitation   Micro: none  Cardiac tracings I have personally interpreted today:  none  Pertinent Radiological findings (summarize): none   Lab Results  Component Value Date   WBC 5.8 10/02/2020   HGB 15.1 10/02/2020   HCT 45.0 10/02/2020   PLT 123.0 (L) 10/02/2020   GLUCOSE 91 10/02/2020   CHOL 149 10/02/2020   TRIG 158.0 (H) 10/02/2020   HDL 40.50 10/02/2020   LDLDIRECT 124.7 05/08/2014   LDLCALC 77 10/02/2020   ALT 18 10/02/2020   AST 20 10/02/2020   NA 137 10/02/2020   K 4.7  10/02/2020   CL 100 10/02/2020   CREATININE 1.11 10/02/2020   BUN 21 10/02/2020   CO2 32 10/02/2020   TSH 4.24 10/02/2020   PSA 1.14 10/02/2020   INR 2.5 09/30/2020   HGBA1C 5.9 10/02/2020   Assessment/Plan:  [name] [name] is a 75 y.o. White or Caucasian [1] male with  has a past medical history of ALLERGIC RHINITIS (10/31/2007), Allergy, CHOLECYSTECTOMY, HX OF (06/04/2007), COLONIC POLYPS, HX OF (10/31/2007), CORONARY ARTERY BYPASS GRAFT, HX OF (06/04/2007), CORONARY ARTERY DISEASE (06/04/2007), Diverticulosis, Erectile dysfunction, Eye twitch, HYPERLIPIDEMIA (06/04/2007), Hypersomnolence, HYPERTENSION (06/04/2007), Hypothyroidism, ISCHEMIC CARDIOMYOPATHY (06/04/2007), Myocardial infarction ([place]), Osteoarthritis, PLMD (periodic limb movement disorder), Sleep apnea, Vertigo, and Vitamin D deficiency.  Encounter for well adult exam with abnormal findings Age and sex appropriate education and counseling updated with regular exercise and diet Referrals for preventative services - for colonoscopy july 2022, o/w up to date Immunizations addressed - none needed Smoking counseling  - none needed Evidence for depression or other  mood disorder - none significant Most recent labs reviewed. I have personally reviewed and have noted: 1) the patient's medical and social history 2) The patient's current medications and supplements 3) The patient's height, weight, and BMI have been recorded in the chart   Allergic rhinitis Mild to mod, for allegra add to nasacort asd,  to f/u any worsening symptoms or concerns  Essential hypertension BP Readings from Last 3 Encounters:  10/02/20 118/70  06/17/20 125/71  05/08/20 132/78   Stable, pt to continue medical treatment toprol xl, entresto   Current Outpatient Medications (Cardiovascular):  .  ezetimibe (ZETIA) 10 MG tablet, Take 1 tablet (10 mg total) by mouth daily. .  metoprolol succinate (TOPROL-XL) 50 MG 24 hr tablet, TAKE 1 TABLET BY MOUTH  DAILY. TAKE WITH OR IMMEDIATELY FOLLOWING A MEAL. .  rosuvastatin (CRESTOR) 5 MG tablet, TAKE 1 TABLET EVERY OTHER DAY .  sacubitril-valsartan (ENTRESTO) 49-51 MG, Take 0.5 tablets by mouth 2 (two) times daily. .  valsartan (DIOVAN) 320 MG tablet, Take 1 tablet (320 mg total) by mouth daily.  Current Outpatient Medications (Respiratory):  .  triamcinolone (NASACORT) 55 MCG/ACT AERO nasal inhaler, Place 2 sprays into the nose daily.  Current Outpatient Medications (Analgesics):  .  meloxicam (MOBIC) 15 MG tablet, Take 15 mg by mouth daily.   Current Outpatient Medications (Hematological):  .  warfarin (COUMADIN) 5 MG tablet, TAKE 1 TABLET EVERY DAY AT 4 PM  Current Outpatient Medications (Other):  Marland Kitchen  Multiple Vitamins-Minerals (MULTIVITAMIN WITH MINERALS) tablet, Take 1 tablet by mouth daily. Centrum silver   Hyperglycemia Lab Results  Component Value Date   HGBA1C 5.9 10/02/2020   Stable, pt to continue current medical treatment  - diet   Hyperlipidemia Lab Results  Component Value Date   LDLCALC 77 10/02/2020   Stable, pt to continue current statin crestor and zetia   Vitamin D deficiency Last vitamin D Lab Results  Component Value Date   VD25OH 43.60 10/02/2020   Stable, cont oral replacement  Right leg paresthesias I suspect local distal RLE neuropathy, declines emg/ncs, to f/u ortho as planned per pt preference, declines PT, for handicapped parking application signed  Followup: Return in about 1 year (around 10/02/2021).  Cathlean Cower, MD 10/02/2020 9:53 PM [facility]

## 2020-10-02 NOTE — Assessment & Plan Note (Signed)
Lab Results  Component Value Date   LDLCALC 77 10/02/2020   Stable, pt to continue current statin crestor and zetia

## 2020-10-02 NOTE — Assessment & Plan Note (Addendum)
I suspect local distal RLE neuropathy, declines emg/ncs, to f/u ortho as planned per pt preference, declines PT, for handicapped parking application signed

## 2020-10-02 NOTE — Assessment & Plan Note (Signed)
Mild to mod, for allegra add to nasacort asd,  to f/u any worsening symptoms or concerns

## 2020-10-02 NOTE — Patient Instructions (Addendum)
Ok to add the OTC Allegra 180 mg per day, and take the Nasacort every day for the sinus allergies and to help the headache.    It seems you may have localized nerve damage for some reason at the end of the outside right leg; you may wish to have nerve testing called EMG/NCS, so you may wish to bring this up when you see the [facility] as you mentioned  You are given the handicapped parking application signed today  Please continue all other medications as before, and refills have been done if requested.  Please have the pharmacy call with any other refills you may need.  Please continue your efforts at being more active, low cholesterol diet, and weight control.  You are otherwise up to date with prevention measures today.  Please keep your appointments with your specialists as you may have planned  Please go to the LAB at the blood drawing area for the tests to be done  You will be contacted by phone if any changes need to be made immediately.  Otherwise, you will receive a letter about your results with an explanation, but please check with MyChart first.  Please remember to sign up for MyChart if you have not done so, as this will be important to you in the future with finding out test results, communicating by private email, and scheduling acute appointments online when needed.  Please make an Appointment to return for your 1 year visit, or sooner if needed, with Lab testing by Appointment as well, to be done about 3-5 days before at the [facility] (so this is for TWO appointments - please see the scheduling desk as you leave)  Due to the ongoing Covid 19 pandemic, our lab now requires an appointment for any labs done at our office.  If you need labs done and do not have an appointment, please call our office ahead of time to schedule before presenting to the lab for your testing.

## 2020-10-02 NOTE — Assessment & Plan Note (Signed)
BP Readings from Last 3 Encounters:  10/02/20 118/70  06/17/20 125/71  05/08/20 132/78   Stable, pt to continue medical treatment toprol xl, entresto   Current Outpatient Medications (Cardiovascular):  .  ezetimibe (ZETIA) 10 MG tablet, Take 1 tablet (10 mg total) by mouth daily. .  metoprolol succinate (TOPROL-XL) 50 MG 24 hr tablet, TAKE 1 TABLET BY MOUTH DAILY. TAKE WITH OR IMMEDIATELY FOLLOWING A MEAL. .  rosuvastatin (CRESTOR) 5 MG tablet, TAKE 1 TABLET EVERY OTHER DAY .  sacubitril-valsartan (ENTRESTO) 49-51 MG, Take 0.5 tablets by mouth 2 (two) times daily. .  valsartan (DIOVAN) 320 MG tablet, Take 1 tablet (320 mg total) by mouth daily.  Current Outpatient Medications (Respiratory):  .  triamcinolone (NASACORT) 55 MCG/ACT AERO nasal inhaler, Place 2 sprays into the nose daily.  Current Outpatient Medications (Analgesics):  .  meloxicam (MOBIC) 15 MG tablet, Take 15 mg by mouth daily.   Current Outpatient Medications (Hematological):  .  warfarin (COUMADIN) 5 MG tablet, TAKE 1 TABLET EVERY DAY AT 4 PM  Current Outpatient Medications (Other):  Marland Kitchen  Multiple Vitamins-Minerals (MULTIVITAMIN WITH MINERALS) tablet, Take 1 tablet by mouth daily. Centrum silver

## 2020-10-02 NOTE — Assessment & Plan Note (Signed)
Age and sex appropriate education and counseling updated with regular exercise and diet Referrals for preventative services - for colonoscopy july 2022, o/w up to date Immunizations addressed - none needed Smoking counseling  - none needed Evidence for depression or other mood disorder - none significant Most recent labs reviewed. I have personally reviewed and have noted: 1) the patient's medical and social history 2) The patient's current medications and supplements 3) The patient's height, weight, and BMI have been recorded in the chart

## 2020-10-02 NOTE — Assessment & Plan Note (Signed)
Lab Results  Component Value Date   HGBA1C 5.9 10/02/2020   Stable, pt to continue current medical treatment  - diet

## 2020-10-02 NOTE — Assessment & Plan Note (Signed)
Last vitamin D ?Lab Results  ?Component Value Date  ? VD25OH 43.60 10/02/2020  ? ?Stable, cont oral replacement ?

## 2020-10-04 NOTE — Progress Notes (Signed)
EPIC Encounter for ICM Monitoring  Patient Name: [name] [name] is a 75 y.o. male Date: 10/04/2020 Primary Care Physican: Biagio Borg, MD Primary Cardiologist:Crenshaw Electrophysiologist:Allred Office Weight: 224.6 lbs    Spoke with patient and he reports recent leg swelling and headaches.  Explained salt may increase BP and cause the fluid accumulation.  He uses no salt seasoning but does eat at restaurants 4-5 times a week.   BP readings  2/14:  154/89 HR 63  2/17:  144/81 HR 60.     Optivol thoracic impedance normal but was suggesting possible fluid accumulation since 08/25/2020 - 09/26/2020 with a few days close to baseline.  Prior to January, fluid levels were normal.  No diuretic  Recommendations: Education given to limit salt intake 2000 mg a day.  Advised if let swelling worsens to call the office.  Also advised to continue BP monitoring and contact Dr Jacalyn Lefevre office is readings remain elevated over the next couple of weeks.    Follow-up plan: ICM clinic phone appointment on3/28/2021. 91 day device clinic remote transmission 10/29/2020.   EP/Cardiology Office Visits:Recall for 11/10/2020 with Oda Kilts, PA.  Recall 12/14/2020 with Dr [name].   Copy of ICM check sent to Dr.Allred and Dr [name] for [facility] on BP Readings, leg swelling and report suggests possible fluid accumulation in the last month.  3 month ICM trend: 09/30/2020.    1 Year ICM trend:       Rosalene Billings, RN 10/04/2020 3:21 PM

## 2020-10-28 ENCOUNTER — Ambulatory Visit (INDEPENDENT_AMBULATORY_CARE_PROVIDER_SITE_OTHER): Payer: Medicare HMO | Admitting: *Deleted

## 2020-10-28 DIAGNOSIS — I240 Acute coronary thrombosis not resulting in myocardial infarction: Secondary | ICD-10-CM

## 2020-10-28 DIAGNOSIS — Z5181 Encounter for therapeutic drug level monitoring: Secondary | ICD-10-CM | POA: Diagnosis not present

## 2020-10-28 LAB — POCT INR: INR: 1.9 — AB (ref 2.0–3.0)

## 2020-10-28 NOTE — Patient Instructions (Signed)
Take warfarin 1 1/2 tablets tonight then resume 1 tablet daily except 1/2 tablet on Fridays Recheck in 4 weeks

## 2020-10-29 ENCOUNTER — Ambulatory Visit (INDEPENDENT_AMBULATORY_CARE_PROVIDER_SITE_OTHER): Payer: Medicare HMO

## 2020-10-29 DIAGNOSIS — I255 Ischemic cardiomyopathy: Secondary | ICD-10-CM

## 2020-10-30 LAB — CUP PACEART REMOTE DEVICE CHECK
Battery Remaining Longevity: 118 mo
Battery Voltage: 3.01 V
Brady Statistic AP VP Percent: 0.06 %
Brady Statistic AP VS Percent: 50.39 %
Brady Statistic AS VP Percent: 0.02 %
Brady Statistic AS VS Percent: 49.53 %
Brady Statistic RA Percent Paced: 49.33 %
Brady Statistic RV Percent Paced: 0.09 %
Date Time Interrogation Session: 20220323111332
HighPow Impedance: 69 Ohm
Implantable Lead Implant Date: 20210622
Implantable Lead Implant Date: 20210622
Implantable Lead Location: 753859
Implantable Lead Location: 753860
Implantable Lead Model: 5076
Implantable Pulse Generator Implant Date: 20210622
Lead Channel Impedance Value: 342 Ohm
Lead Channel Impedance Value: 456 Ohm
Lead Channel Impedance Value: 475 Ohm
Lead Channel Pacing Threshold Amplitude: 0.5 V
Lead Channel Pacing Threshold Amplitude: 0.875 V
Lead Channel Pacing Threshold Pulse Width: 0.4 ms
Lead Channel Pacing Threshold Pulse Width: 0.4 ms
Lead Channel Sensing Intrinsic Amplitude: 3.625 mV
Lead Channel Sensing Intrinsic Amplitude: 3.625 mV
Lead Channel Sensing Intrinsic Amplitude: 8.625 mV
Lead Channel Sensing Intrinsic Amplitude: 8.625 mV
Lead Channel Setting Pacing Amplitude: 1.75 V
Lead Channel Setting Pacing Amplitude: 2.5 V
Lead Channel Setting Pacing Pulse Width: 0.4 ms
Lead Channel Setting Sensing Sensitivity: 0.3 mV

## 2020-11-04 ENCOUNTER — Ambulatory Visit (INDEPENDENT_AMBULATORY_CARE_PROVIDER_SITE_OTHER): Payer: Medicare HMO

## 2020-11-04 DIAGNOSIS — Z95810 Presence of automatic (implantable) cardiac defibrillator: Secondary | ICD-10-CM | POA: Diagnosis not present

## 2020-11-04 DIAGNOSIS — I255 Ischemic cardiomyopathy: Secondary | ICD-10-CM

## 2020-11-06 NOTE — Progress Notes (Signed)
EPIC Encounter for ICM Monitoring  Patient Name: [name] [name] is a 75 y.o. male Date: 11/06/2020 Primary Care Physican: Biagio Borg, MD Primary Cardiologist:Crenshaw Electrophysiologist:Allred 11/06/2020 Weight: 224 lbs    Spoke with patient and reports feeling well at this time.  Denies fluid symptoms.  He uses no salt seasoning but does eat at restaurants 4-5 times a week.  Advised restaurant foods are high in salt.   Optivol thoracic impedance normal but wassuggesting possible fluid accumulation since 3/4-3/22.  No diuretic  Recommendations:  No changes and encouraged to call if experiencing any fluid symptoms.    Follow-up plan: ICM clinic phone appointment on5/09/2019. 91 day device clinic remote transmission6/21/2022.  EP/Cardiology Office Visits:01/14/2021 with Dr [name].Last EP visit was 05/08/2020 with Dr Rayann Heman and advised of 1 year follow up with Oda Kilts, PA.  Copy of ICM check sent to Dr.Allred.   3 month ICM trend: 11/04/2020.    1 Year ICM trend:       Rosalene Billings, RN 11/06/2020 11:51 AM

## 2020-11-06 NOTE — Progress Notes (Signed)
Remote ICD transmission.   

## 2020-11-25 ENCOUNTER — Ambulatory Visit (INDEPENDENT_AMBULATORY_CARE_PROVIDER_SITE_OTHER): Payer: Medicare HMO | Admitting: *Deleted

## 2020-11-25 ENCOUNTER — Other Ambulatory Visit: Payer: Self-pay

## 2020-11-25 DIAGNOSIS — I240 Acute coronary thrombosis not resulting in myocardial infarction: Secondary | ICD-10-CM

## 2020-11-25 DIAGNOSIS — Z5181 Encounter for therapeutic drug level monitoring: Secondary | ICD-10-CM

## 2020-11-25 LAB — POCT INR: INR: 2.5 (ref 2.0–3.0)

## 2020-11-25 NOTE — Patient Instructions (Signed)
Continue 1 tablet daily except 1/2 tablet on Fridays Recheck in 4 weeks

## 2020-12-09 ENCOUNTER — Ambulatory Visit (INDEPENDENT_AMBULATORY_CARE_PROVIDER_SITE_OTHER): Payer: Medicare HMO

## 2020-12-09 DIAGNOSIS — Z95810 Presence of automatic (implantable) cardiac defibrillator: Secondary | ICD-10-CM | POA: Diagnosis not present

## 2020-12-09 DIAGNOSIS — I255 Ischemic cardiomyopathy: Secondary | ICD-10-CM | POA: Diagnosis not present

## 2020-12-11 NOTE — Progress Notes (Signed)
EPIC Encounter for ICM Monitoring  Patient Name: [name] [name] is a 75 y.o. male Date: 12/11/2020 Primary Care Physican: Biagio Borg, MD Primary Cardiologist:Crenshaw Electrophysiologist:Allred 11/06/2020 Weight: 224 lbs   Spoke with patient and reports feeling well at this time.  Denies fluid symptoms.   He uses no salt seasoning but does eat at restaurants 4-5 times a week.  He says he has a lot of headaches and not sure why.    Optivol thoracic impedancenormal but wassuggesting possible fluid accumulation since3/4-3/22.  No diuretic  Recommendations:  No changes and encouraged to call if experiencing any fluid symptoms.  Follow-up plan: ICM clinic phone appointment on6/01/2020. 91 day device clinic remote transmission6/21/2022.  EP/Cardiology Office Visits:01/14/2021 with Dr [name].Last EP visit was 05/08/2020 with Dr Rayann Heman and advised of 1 year follow up with Oda Kilts, PA.  Copy of ICM check sent to Dr.Allred.   3 month ICM trend: 12/09/2020.    1 Year ICM trend:       Rosalene Billings, RN 12/11/2020 11:24 AM

## 2020-12-17 ENCOUNTER — Telehealth: Payer: Self-pay

## 2020-12-17 NOTE — Telephone Encounter (Signed)
Patient called back- scheduler scheduled for appointment tomorrow.

## 2020-12-17 NOTE — Telephone Encounter (Signed)
Called patient to advise of a APP appointment need.   Left message for patient to call back.  Left call back number.

## 2020-12-17 NOTE — Telephone Encounter (Signed)
Called patient after reviewing mychart message- he states all of this started on Saturday evening. Sunday he stated having pressure in his chest while walking with his son causing him to not be able to walk hardly any distance. Patient states that he is weak in his legs, and shortness of breath when doing activities.  BP today 147/84 HR 61 Denies changes in medications.  Denies swelling in his extremities, and unsure of weight gain but states he feels he is getting tight. Pants are fitting tighter per patient.  Denies pain with pushing on his chest area.  States it is a chest pressure feeling, he is feeling okay right now on the phone with me.   Advised patient of ED precautions, patient verbalized understanding.   I will route message to MD to advise and give any recommendations.  Patient thankful for call back.

## 2020-12-17 NOTE — Progress Notes (Signed)
Cardiology Clinic Note   Patient Name: [name] [name] Date of Encounter: 12/18/2020  Primary Care Provider:  Biagio Borg, MD Primary Cardiologist:  Kirk Ruths, MD  Patient Profile    [name] [name] 75 year old male presents the clinic today for evaluation of his chest pressure and shortness of breath.  Past Medical History    Past Medical History:  Diagnosis Date  . ALLERGIC RHINITIS 10/31/2007   Qualifier: Diagnosis of  By: Jenny Reichmann MD, [place]   . Allergy   . CHOLECYSTECTOMY, HX OF 06/04/2007   Qualifier: Diagnosis of  By: Danny Lawless CMA, [place]    . COLONIC POLYPS, HX OF 10/31/2007   Qualifier: Diagnosis of  By: Jenny Reichmann MD, [place], HX OF 06/04/2007   Qualifier: Diagnosis of  By: [place], [place]    . CORONARY ARTERY DISEASE 06/04/2007   Qualifier: Diagnosis of  By: [place], [place]    . Diverticulosis   . Erectile dysfunction   . Eye twitch    right eye since chilhood   . HYPERLIPIDEMIA 06/04/2007   Qualifier: Diagnosis of  By: Danny Lawless CMA, [place]    . Hypersomnolence   . HYPERTENSION 06/04/2007   Qualifier: Diagnosis of  By: [place], [place]    . Hypothyroidism   . ISCHEMIC CARDIOMYOPATHY 06/04/2007   Qualifier: Diagnosis of  By: Danny Lawless CMA, [place]    . Myocardial infarction [facility])    per patient , his occurred in 1998   . Osteoarthritis   . PLMD (periodic limb movement disorder)   . Sleep apnea    no cpap' per patient , "i was checked for it and they said i didnt have it "   . Vertigo   . Vitamin D deficiency    Past Surgical History:  Procedure Laterality Date  . CHOLECYSTECTOMY    . COLONOSCOPY    . CORONARY ARTERY BYPASS GRAFT    . ICD IMPLANT N/A 01/30/2020   Procedure: ICD IMPLANT;  Surgeon: Thompson Grayer, MD;  Location: [facility];  Service: Cardiovascular;  Laterality: N/A;  . KNEE ARTHROPLASTY Left 08/05/2017   Procedure: LEFT TOTAL KNEE ARTHROPLASTY WITH COMPUTER NAVIGATION;  Surgeon:  Rod Can, MD;  Location: [facility];  Service: Orthopedics;  Laterality: Left;  Needs RNFA  . PENILE PROSTHESIS IMPLANT    . REPLACEMENT TOTAL KNEE  06/12/2013  . spinal cyst    . THORACOTOMY     left anterior; wound exploration and debridement  . TONSILLECTOMY AND ADENOIDECTOMY     age 68    Allergies  Allergies  Allergen Reactions  . Ace Inhibitors Cough  . Rosuvastatin Other (See Comments)    Muscle pains  . Statins Other (See Comments)    Muscle pains    History of Present Illness    [name] [name] has a PMH of essential hypertension, coronary atherosclerosis, ischemic cardiomyopathy, hyperlipidemia, cholecystectomy, CABG, vitamin D deficiency, and right leg paresthesia.  He had his first myocardial infarction in 1998.  He underwent cardiac catheterization and received nonrelated basically not to his LAD.  His nuclear stress test 5/16 showed an EF of 40%.  He was noted to have prior anterior apical infarct but no ischemia.  His echocardiogram 6/20 showed an EF of 30-35% with possible LV apical thrombus.  He underwent cardiac MRI which showed mild left atrial enlargement, mild mitral regurgitation, trace aortic insufficiency.  His cardiac MRI 7/20 showed an EF of 30% and apical thrombus.  Mild aortic and mitral regurgitation.  A follow-up echocardiogram on medical therapy 11/20 showed an EF of 30-35%, moderate left ventricular enlargement, biatrial enlargement, mild mitral tricuspid and aortic insufficiency with mild aortic stenosis.  A nuclear stress test 1/21 showed an EF of 32% and prior infarct but no ischemia.  He had a ICD placed 6/21.  He was last seen by Dr. [name] on 06/17/2020.  During that time he noted some shortness of breath with more vigorous activities but not with routine activities.  He denied orthopnea and PND.  He had occasional mild edema in his feet.  He denied chest pain.  He contacted nurse triage line on 12/17/2020 and reported that he was having pressure in  his chest while walking with his son.  He noted significantly reduced endurance.  He felt weak in his legs and noted increased shortness of breath.  His blood pressure was 147/84 with a heart rate of 61.  He denied lower extremity swelling but did note his pants were fitting tighter.  At the time of the call he was not feeling any chest pressure.  He presents the clinic today for follow-up evaluation and states Sunday after dinner he was walking to his son's truck and noted increased shortness of breath.  His symptoms continued on Sunday with shortness of breath and lower extremity weakness.  He reports that today his symptoms are somewhat improved.  He denies sick contacts and recent illness.  He does report that he has had some mild headaches for the last 2 months that are felt to be related to allergies.  We reviewed his previous echocardiogram.  I will order a CBC, BMP, echocardiogram, and have [name] follow-up in 1 month.  Today he denies chest pain, increased shortness of breath, lower extremity edema,palpitations, melena, hematuria, hemoptysis, diaphoresis, presyncope, syncope, orthopnea, and PND.   Home Medications    Prior to Admission medications   Medication Sig Start Date End Date Taking? Authorizing Provider  ezetimibe (ZETIA) 10 MG tablet Take 1 tablet (10 mg total) by mouth daily. 04/09/20 07/08/20  [name], MD  meloxicam (MOBIC) 15 MG tablet Take 15 mg by mouth daily.     [provider]  metoprolol succinate (TOPROL-XL) 50 MG 24 hr tablet TAKE 1 TABLET BY MOUTH DAILY. TAKE WITH OR IMMEDIATELY FOLLOWING A MEAL. 08/06/20   [name], MD  Multiple Vitamins-Minerals (MULTIVITAMIN WITH MINERALS) tablet Take 1 tablet by mouth daily. Centrum silver    [provider]  rosuvastatin (CRESTOR) 5 MG tablet TAKE 1 TABLET EVERY OTHER DAY 08/06/20   [name], MD  sacubitril-valsartan (ENTRESTO) 49-51 MG Take 0.5 tablets by mouth 2 (two) times daily. 04/12/20    [provider]  triamcinolone (NASACORT) 55 MCG/ACT AERO nasal inhaler Place 2 sprays into the nose daily. 03/22/19   Biagio Borg, MD  valsartan (DIOVAN) 320 MG tablet Take 1 tablet (320 mg total) by mouth daily. 08/15/20   [name], MD  warfarin (COUMADIN) 5 MG tablet TAKE 1 TABLET EVERY DAY AT 4 PM 05/02/20   Biagio Borg, MD    Family History    Family History  Problem Relation Age of Onset  . Stomach cancer Mother   . Lung cancer Father   . Heart disease Brother        first MI at 58yo, now 31 for transplant list/ ICM  . Colon cancer Maternal Grandfather   . Esophageal cancer Neg Hx   .  Pancreatic cancer Neg Hx   . Prostate cancer Neg Hx   . Rectal cancer Neg Hx    He indicated that his mother is deceased. He indicated that his father is deceased. He indicated that the status of his brother is unknown. He indicated that the status of his maternal grandfather is unknown. He indicated that the status of his neg hx is unknown.  Social History    Social History   Socioeconomic History  . Marital status: Married    Spouse name: Not on file  . Number of children: 3  . Years of education: Not on file  . Highest education level: Not on file  Occupational History  . Occupation: truck Education administrator: [employer]  Tobacco Use  . Smoking status: Former Smoker    Packs/day: 4.00    Years: 17.00    Pack years: 68.00    Types: Cigarettes    Quit date: 05/07/1981    Years since quitting: 39.6  . Smokeless tobacco: Never Used  . Tobacco comment: Pt states that he would let most of them "burn" pt states that he used anywhere between 4-5PPD  Vaping Use  . Vaping Use: Never used  Substance and Sexual Activity  . Alcohol use: Yes    Alcohol/week: 0.0 standard drinks    Comment: rare  . Drug use: No  . Sexual activity: Not on file  Other Topics Concern  . Not on file  Social History Narrative   Lives in [place] with spouse   Retired Administrator    Social Determinants of Radio broadcast assistant Strain: Not on file  Food Insecurity: Not on file  Transportation Needs: Not on file  Physical Activity: Not on file  Stress: Not on file  Social Connections: Not on file  Intimate Partner Violence: Not on file     Review of Systems    General:  No chills, fever, night sweats or weight changes.  Cardiovascular:  No chest pain, dyspnea on exertion, edema, orthopnea, palpitations, paroxysmal nocturnal dyspnea. Dermatological: No rash, lesions/masses Respiratory: No cough, dyspnea Urologic: No hematuria, dysuria Abdominal:   No nausea, vomiting, diarrhea, bright red blood per rectum, melena, or hematemesis Neurologic:  No visual changes, wkns, changes in mental status. All other systems reviewed and are otherwise negative except as noted above.  Physical Exam    VS:  BP 122/90 (BP Location: Left Arm, Patient Position: Sitting, Cuff Size: Normal)   Pulse 72   Ht 5\' 10"  (1.778 m)   Wt 226 lb 3.2 oz (102.6 kg)   BMI 32.46 kg/m  , BMI Body mass index is 32.46 kg/m. GEN: Well nourished, well developed, in no acute distress. HEENT: normal. Neck: Supple, no JVD, carotid bruits, or masses. Cardiac: RRR, no murmurs, rubs, or gallops. No clubbing, cyanosis, edema.  Radials/DP/PT 2+ and equal bilaterally.  Respiratory:  Respirations regular and unlabored, clear to auscultation bilaterally. GI: Soft, nontender, nondistended, BS + x 4. MS: no deformity or atrophy. Skin: warm and dry, no rash. Neuro:  Strength and sensation are intact. Psych: Normal affect.  Accessory Clinical Findings    Recent Labs: 10/02/2020: ALT 18; BUN 21; Creatinine, Ser 1.11; Hemoglobin 15.1; Platelets 123.0; Potassium 4.7; Sodium 137; TSH 4.24   Recent Lipid Panel    Component Value Date/Time   CHOL 149 10/02/2020 1408   CHOL 165 12/13/2019 1010   TRIG 158.0 (H) 10/02/2020 1408   TRIG 85 06/21/2006 1415   HDL 40.50  10/02/2020 1408   HDL 38 (L)  12/13/2019 1010   CHOLHDL 4 10/02/2020 1408   VLDL 31.6 10/02/2020 1408   LDLCALC 77 10/02/2020 1408   LDLCALC 101 (H) 12/13/2019 1010   LDLDIRECT 124.7 05/08/2014 1517    ECG personally reviewed by me today-sinus rhythm with marked sinus arrhythmia with occasional PVCs nonspecific intraventricular block 72 bpm- No acute changes  Echocardiogram 06/28/2019 IMPRESSIONS    1. Left ventricular ejection fraction, by visual estimation, is 30 to  35%. The left ventricle has moderate to severely decreased function. There  is no left ventricular hypertrophy.  2. Moderately dilated left ventricular internal cavity size.  3. Septal and apical akinesis. Previously described apical thrombus by  MRI not apparent using definity on this echo study althought less  sensitive than MRI.  4. Global right ventricle has normal systolic function.The right  ventricular size is normal. No increase in right ventricular wall  thickness.  5. Left atrial size was moderately dilated.  6. Right atrial size was mildly dilated.  7. Moderate thickening of the mitral valve leaflet(s).  8. The mitral valve is normal in structure. Mild mitral valve  regurgitation.  9. The tricuspid valve is normal in structure. Tricuspid valve  regurgitation is mild.  10. The aortic valve was not well visualized. Aortic valve regurgitation  is mild. Mild aortic valve stenosis.  11. Likely low gradient mild AS.  12. The pulmonic valve was grossly normal. Pulmonic valve regurgitation is  mild.  13. Aortic dilatation noted.  14. There is mild dilatation of the aortic root measuring 38 mm.  Nuclear stress test 08/16/2019  Nuclear stress EF: 32%. The left ventricular ejection fraction is moderately decreased (30-44%).  Defect 1: There is a large defect of severe severity present in the mid anterior, mid anteroseptal, mid inferoseptal, apical anterior, apical septal, apical inferior, apical lateral and apex  location.  Findings consistent with prior anteroapical myocardial infarction.  This is an intermediate risk study. There is no evidence of ischemia. There is evidence of a previous anterior apical myocardial infarction.    Assessment & Plan   1.  Chest pain/shortness of breath- notes increased shortness of breath and decreased activity tolerance.  He reports he has felt this way since Saturday evening. Continue metoprolol, Valsartan Heart healthy low-sodium diet-salty 6 given Increase physical activity as tolerated Order echocardiogram Ordered BMP, CBC  Coronary artery disease-nuclear stress test 1/21 showed an EF of 32% and prior infarct but no ischemia. Continue ezetimibe, metoprolol, rosuvastatin Heart healthy low-sodium diet-salty 6 given Increase physical activity as tolerated  Ischemic cardiomyopathy/ICD-ICD placed 01/30/2020 Continue Entresto, metoprolol Follows with EP  Essential hypertension-BP today 122/90.  Well-controlled at home. Continue metoprolol, valsartan Heart healthy low-sodium diet-salty 6 given Increase physical activity as tolerated  Hyperlipidemia-10/02/2020: Cholesterol 149; HDL 40.50; LDL Cholesterol 77; Triglycerides 158.0; VLDL 31.6 Continue ezetimibe, rosuvastatin Heart healthy low-sodium high-fiber diet increase physical activity as tolerated  History of LV apical thrombus- reports compliance with Coumadin.  Denies bleeding issues. Continue warfarin   Disposition: Follow-up with Dr. [name] or me in 1 month.  Jossie Ng. Mariaeduarda Defranco NP-C    12/18/2020, 8:50 AM [address] Office [phone] Fax [phone]  Notice: This dictation was prepared with Dragon dictation along with smaller phrase technology. Any transcriptional errors that result from this process are unintentional and may not be corrected upon review.  I spent 13 minutes examining this patient, reviewing medications, and using  patient centered  shared decision making involving her cardiac care.  Prior to her visit I spent greater than 20 minutes reviewing her past medical history,  medications, and prior cardiac tests.

## 2020-12-18 ENCOUNTER — Ambulatory Visit: Payer: Medicare HMO | Admitting: General Practice

## 2020-12-18 ENCOUNTER — Encounter: Payer: Self-pay | Admitting: General Practice

## 2020-12-18 ENCOUNTER — Other Ambulatory Visit: Payer: Self-pay

## 2020-12-18 VITALS — BP 122/90 | HR 72 | Ht 70.0 in | Wt 226.2 lb

## 2020-12-18 DIAGNOSIS — I255 Ischemic cardiomyopathy: Secondary | ICD-10-CM

## 2020-12-18 DIAGNOSIS — Z79899 Other long term (current) drug therapy: Secondary | ICD-10-CM | POA: Diagnosis not present

## 2020-12-18 DIAGNOSIS — R079 Chest pain, unspecified: Secondary | ICD-10-CM | POA: Diagnosis not present

## 2020-12-18 DIAGNOSIS — I2510 Atherosclerotic heart disease of native coronary artery without angina pectoris: Secondary | ICD-10-CM

## 2020-12-18 DIAGNOSIS — I10 Essential (primary) hypertension: Secondary | ICD-10-CM

## 2020-12-18 DIAGNOSIS — R0602 Shortness of breath: Secondary | ICD-10-CM | POA: Diagnosis not present

## 2020-12-18 DIAGNOSIS — E7800 Pure hypercholesterolemia, unspecified: Secondary | ICD-10-CM | POA: Diagnosis not present

## 2020-12-18 DIAGNOSIS — I240 Acute coronary thrombosis not resulting in myocardial infarction: Secondary | ICD-10-CM

## 2020-12-18 LAB — BASIC METABOLIC PANEL
BUN/Creatinine Ratio: 14 (ref 10–24)
BUN: 17 mg/dL (ref 8–27)
CO2: 27 mmol/L (ref 20–29)
Calcium: 9.8 mg/dL (ref 8.6–10.2)
Chloride: 99 mmol/L (ref 96–106)
Creatinine, Ser: 1.22 mg/dL (ref 0.76–1.27)
Glucose: 112 mg/dL — ABNORMAL HIGH (ref 65–99)
Potassium: 4.9 mmol/L (ref 3.5–5.2)
Sodium: 139 mmol/L (ref 134–144)
eGFR: 62 mL/min/{1.73_m2} (ref >59)

## 2020-12-18 LAB — CBC
Hematocrit: 44.2 % (ref 37.5–51.0)
Hemoglobin: 15 g/dL (ref 13.0–17.7)
MCH: 31.6 pg (ref 26.6–33.0)
MCHC: 33.9 g/dL (ref 31.5–35.7)
MCV: 93 fL (ref 79–97)
Platelets: 122 10*3/uL — ABNORMAL LOW (ref 150–450)
RBC: 4.75 x10E6/uL (ref 4.14–5.80)
RDW: 12.8 % (ref 11.6–15.4)
WBC: 6.8 10*3/uL (ref 3.4–10.8)

## 2020-12-18 NOTE — Patient Instructions (Signed)
Medication Instructions:  The current medical regimen is effective;  continue present plan and medications as directed. Please refer to the Current Medication list given to you today.  *If you need a refill on your cardiac medications before your next appointment, please call your pharmacy*  Lab Work: BMET AND CBC TODAY If you have labs (blood work) drawn today and your tests are completely normal, you will receive your results only by:  [place] (if you have MyChart) OR A paper copy in the mail.  If you have any lab test that is abnormal or we need to change your treatment, we will call you to review the results. You may go to any [facility] that is convenient for you however, we do have a lab in our office that is able to assist you. You DO NOT need an appointment for our lab. The lab is open 8:00am and closes at 4:00pm. Lunch 12:45 - 1:45pm.  Testing/Procedures: Echocardiogram - Your physician has requested that you have an echocardiogram. Echocardiography is a painless test that uses sound waves to create images of your heart. It provides your doctor with information about the size and shape of your heart and how well your heart's chambers and valves are working. This procedure takes approximately one hour. There are no restrictions for this procedure. This will be performed at our [facility] location - [address].  Follow-Up: Your next appointment:  AFTER ECHO  In Person with Kirk Ruths, MD OR IF UNAVAILABLE [place], FNP-C  At [facility], you and your health needs are our priority.  As part of our continuing mission to provide you with exceptional heart care, we have created designated Provider Care Teams.  These Care Teams include your primary Cardiologist (physician) and Advanced Practice Providers (APPs -  Physician Assistants and Nurse Practitioners) who all work together to provide you with the care you need, when you need it.

## 2020-12-25 ENCOUNTER — Ambulatory Visit (INDEPENDENT_AMBULATORY_CARE_PROVIDER_SITE_OTHER): Payer: Medicare HMO | Admitting: *Deleted

## 2020-12-25 DIAGNOSIS — I240 Acute coronary thrombosis not resulting in myocardial infarction: Secondary | ICD-10-CM

## 2020-12-25 DIAGNOSIS — Z5181 Encounter for therapeutic drug level monitoring: Secondary | ICD-10-CM

## 2020-12-25 LAB — POCT INR: INR: 2.5 (ref 2.0–3.0)

## 2020-12-25 NOTE — Patient Instructions (Signed)
Continue 1 tablet daily except 1/2 tablet on Fridays Recheck in 6 weeks

## 2020-12-27 ENCOUNTER — Ambulatory Visit (HOSPITAL_COMMUNITY)
Admission: RE | Admit: 2020-12-27 | Discharge: 2020-12-27 | Disposition: A | Discharge status: Home / Self Care | Payer: Medicare HMO | Source: Ambulatory Visit | Attending: General Practice | Admitting: General Practice

## 2020-12-27 ENCOUNTER — Other Ambulatory Visit: Payer: Self-pay

## 2020-12-27 DIAGNOSIS — I255 Ischemic cardiomyopathy: Secondary | ICD-10-CM | POA: Insufficient documentation

## 2020-12-27 LAB — ECHOCARDIOGRAM COMPLETE
AR max vel: 1.31 cm2
AV Area VTI: 1.37 cm2
AV Area mean vel: 1.42 cm2
AV Mean grad: 7.8 mmHg
AV Peak grad: 13.9 mmHg
Ao pk vel: 1.86 m/s
Area-P 1/2: 2.49 cm2
MV M vel: 5.28 m/s
MV Peak grad: 111.5 mmHg
P 1/2 time: 621 msec
S' Lateral: 3.99 cm

## 2020-12-27 MED ORDER — PERFLUTREN LIPID MICROSPHERE
1.0000 mL | INTRAVENOUS | Status: AC | PRN
  Administered 2020-12-27: 1 mL via INTRAVENOUS

## 2020-12-27 NOTE — Progress Notes (Signed)
*  PRELIMINARY RESULTS* Echocardiogram 2D Echocardiogram with definity has been performed.  Leavy Cella 12/27/2020, 4:01 PM

## 2020-12-31 NOTE — Progress Notes (Signed)
HPI: FU CAD; history dates back to 1998 when he had a myocardial infarction. He subsequently underwent minimally invasive LIMA to his LAD. Cardiac MRI July 2020 showed ejection fraction approximately 30% and apical thrombus noted. Mild aortic and mitral regurgitation.Nuclear study January 2021 showed ejection fraction 32%, prior infarct but no ischemia.  Had ICD placed June 2021. Most recent echocardiogram May 2022 showed ejection fraction 30 to 35%, mild mitral regurgitation, mild aortic stenosis, mild aortic insufficiency.  Since I last saw him,patient was seen in early May with complaints of increased chest tightness for 1 week continuously.  He has noticed increased dyspnea on exertion but no orthopnea or PND.  Mild edema right lower extremity.  He denies syncope.  Current Outpatient Medications  Medication Sig Dispense Refill  . ezetimibe (ZETIA) 10 MG tablet Take 1 tablet (10 mg total) by mouth daily. (Patient taking differently: Take 10 mg by mouth daily. Patient takes every other day) 90 tablet 3  . meloxicam (MOBIC) 15 MG tablet Take 15 mg by mouth daily.     . metoprolol succinate (TOPROL-XL) 50 MG 24 hr tablet TAKE 1 TABLET BY MOUTH DAILY. TAKE WITH OR IMMEDIATELY FOLLOWING A MEAL. 90 tablet 3  . Multiple Vitamins-Minerals (MULTIVITAMIN WITH MINERALS) tablet Take 1 tablet by mouth daily. Centrum silver    . rosuvastatin (CRESTOR) 5 MG tablet TAKE 1 TABLET EVERY OTHER DAY 45 tablet 3  . sacubitril-valsartan (ENTRESTO) 49-51 MG Take 0.5 tablets by mouth 2 (two) times daily.    Marland Kitchen triamcinolone (NASACORT) 55 MCG/ACT AERO nasal inhaler Place 2 sprays into the nose daily. 3 Inhaler 3  . valsartan (DIOVAN) 320 MG tablet Take 1 tablet (320 mg total) by mouth daily.    Marland Kitchen warfarin (COUMADIN) 5 MG tablet TAKE 1 TABLET EVERY DAY AT 4 PM 90 tablet 4   No current facility-administered medications for this visit.     Past Medical History:  Diagnosis Date  . ALLERGIC RHINITIS 10/31/2007    Qualifier: Diagnosis of  By: Jenny Reichmann MD, Hunt Oris   . Allergy   . CHOLECYSTECTOMY, HX OF 06/04/2007   Qualifier: Diagnosis of  By: Danny Lawless CMA, [place]    . COLONIC POLYPS, HX OF 10/31/2007   Qualifier: Diagnosis of  By: Jenny Reichmann MD, [place], HX OF 06/04/2007   Qualifier: Diagnosis of  By: [place], [place]    . CORONARY ARTERY DISEASE 06/04/2007   Qualifier: Diagnosis of  By: [place], [place]    . Diverticulosis   . Erectile dysfunction   . Eye twitch    right eye since chilhood   . HYPERLIPIDEMIA 06/04/2007   Qualifier: Diagnosis of  By: Danny Lawless CMA, [place]    . Hypersomnolence   . HYPERTENSION 06/04/2007   Qualifier: Diagnosis of  By: [place], [place]    . Hypothyroidism   . ISCHEMIC CARDIOMYOPATHY 06/04/2007   Qualifier: Diagnosis of  By: Danny Lawless CMA, [place]    . Myocardial infarction [facility])    per patient , his occurred in 1998   . Osteoarthritis   . PLMD (periodic limb movement disorder)   . Sleep apnea    no cpap' per patient , "i was checked for it and they said i didnt have it "   . Vertigo   . Vitamin D deficiency     Past Surgical History:  Procedure Laterality Date  . CHOLECYSTECTOMY    . COLONOSCOPY    . CORONARY ARTERY  BYPASS GRAFT    . ICD IMPLANT N/A 01/30/2020   Procedure: ICD IMPLANT;  Surgeon: Thompson Grayer, MD;  Location: [place] [facility];  Service: Cardiovascular;  Laterality: N/A;  . KNEE ARTHROPLASTY Left 08/05/2017   Procedure: LEFT TOTAL KNEE ARTHROPLASTY WITH COMPUTER NAVIGATION;  Surgeon: Rod Can, MD;  Location: [facility];  Service: Orthopedics;  Laterality: Left;  Needs RNFA  . PENILE PROSTHESIS IMPLANT    . REPLACEMENT TOTAL KNEE  06/12/2013  . spinal cyst    . THORACOTOMY     left anterior; wound exploration and debridement  . TONSILLECTOMY AND ADENOIDECTOMY     age 4    Social History   Socioeconomic History  . Marital status: Married    Spouse name: Not on file  . Number of children: 3  . Years  of education: Not on file  . Highest education level: Not on file  Occupational History  . Occupation: truck Education administrator: [employer]  Tobacco Use  . Smoking status: Former Smoker    Packs/day: 4.00    Years: 17.00    Pack years: 68.00    Types: Cigarettes    Quit date: 05/07/1981    Years since quitting: 39.7  . Smokeless tobacco: Never Used  . Tobacco comment: Pt states that he would let most of them "burn" pt states that he used anywhere between 4-5PPD  Vaping Use  . Vaping Use: Never used  Substance and Sexual Activity  . Alcohol use: Yes    Alcohol/week: 0.0 standard drinks    Comment: rare  . Drug use: No  . Sexual activity: Not on file  Other Topics Concern  . Not on file  Social History Narrative   Lives in [place] with spouse   Retired Administrator   Social Determinants of Radio broadcast assistant Strain: Not on file  Food Insecurity: Not on file  Transportation Needs: Not on file  Physical Activity: Not on file  Stress: Not on file  Social Connections: Not on file  Intimate Partner Violence: Not on file    Family History  Problem Relation Age of Onset  . Stomach cancer Mother   . Lung cancer Father   . Heart disease Brother        first MI at 85yo, now 27 for transplant list/ ICM  . Colon cancer Maternal Grandfather   . Esophageal cancer Neg Hx   . Pancreatic cancer Neg Hx   . Prostate cancer Neg Hx   . Rectal cancer Neg Hx     ROS: no fevers or chills, productive cough, hemoptysis, dysphasia, odynophagia, melena, hematochezia, dysuria, hematuria, rash, seizure activity, orthopnea, PND, pedal edema, claudication. Remaining systems are negative.  Physical Exam: Well-developed well-nourished in no acute distress.  Skin is warm and dry.  HEENT is normal.  Neck is supple.  Chest is clear to auscultation with normal expansion.  Cardiovascular exam is regular rate and rhythm.  Abdominal exam nontender or distended. No masses  palpated. Extremities show no edema. neuro grossly intact  Electrocardiogram-sinus rhythm with occasional PVC, anterior infarct, intermittent atrial pacing.  Personally reviewed.  A/P  1 coronary artery disease-Continue medical therapy.  Continue statin.  He is not on aspirin given need for anticoagulation.  Patient describes increased dyspnea on exertion.  He also had chest tightness with which was unlike his previous infarct pain.  I will arrange a [place] nuclear study for risk stratification.  2 ischemic cardiomyopathy-continue ARB and beta-blocker.  Did not tolerate Entresto previously.  Note he is describing increased dyspnea on exertion.  Add Lasix 20 mg daily and spironolactone 12.5 mg daily.  Check potassium, renal function and BNP in 1 week.  3 ICD-followed by electrophysiology.  4 history of LV apical thrombus-we will continue Coumadin with goal INR 2-3.  5 hypertension-blood pressure mildly elevated.  Add diuretics as outlined above.  Follow blood pressure and adjust medications as needed.  6 hyperlipidemia-continue statin.  He did not tolerate higher doses in the past.  Kirk Ruths, MD

## 2021-01-13 ENCOUNTER — Ambulatory Visit (INDEPENDENT_AMBULATORY_CARE_PROVIDER_SITE_OTHER): Payer: Medicare HMO

## 2021-01-13 DIAGNOSIS — I255 Ischemic cardiomyopathy: Secondary | ICD-10-CM | POA: Diagnosis not present

## 2021-01-13 DIAGNOSIS — Z95810 Presence of automatic (implantable) cardiac defibrillator: Secondary | ICD-10-CM | POA: Diagnosis not present

## 2021-01-13 NOTE — Progress Notes (Signed)
EPIC Encounter for ICM Monitoring  Patient Name: [name] [name] is a 75 y.o. male Date: 01/13/2021 Primary Care Physican: Biagio Borg, MD Primary Cardiologist:C[place] Electrophysiologist:Allred 6/6/2022Weight: 224 lbs   Spoke with patient and reports feeling well at this time.  Denies fluid symptoms.  He reports on Mothers Day he because severely SOB and chest was very tight. He reported to MD and was seen in the office for the episode.  Optivol thoracic impedancenormal but was suggesting possible fluid accumulation from 5/1-5/9.  No diuretic  Recommendations:No changes and encouraged to call if experiencing any fluid symptoms.  Follow-up plan: ICM clinic phone appointment on7/06/2021. 91 day device clinic remote transmission6/21/2022.  EP/Cardiology Office Visits:01/14/2021 with Dr [name].Last EP visit was 05/08/2020 with Dr Rayann Heman and advised of 1 year follow up with Oda Kilts, PA.  Copy of ICM check sent to Dr.Allred.  3 month ICM trend: 01/13/2021.    1 Year ICM trend:       Rosalene Billings, RN 01/13/2021 4:18 PM

## 2021-01-14 ENCOUNTER — Other Ambulatory Visit: Payer: Self-pay

## 2021-01-14 ENCOUNTER — Encounter: Payer: Self-pay | Admitting: Cardiology

## 2021-01-14 ENCOUNTER — Ambulatory Visit (INDEPENDENT_AMBULATORY_CARE_PROVIDER_SITE_OTHER): Payer: Medicare HMO | Admitting: Cardiology

## 2021-01-14 ENCOUNTER — Other Ambulatory Visit: Payer: Self-pay | Admitting: Cardiology

## 2021-01-14 VITALS — BP 138/80 | HR 60 | Ht 71.0 in | Wt 224.4 lb

## 2021-01-14 DIAGNOSIS — I255 Ischemic cardiomyopathy: Secondary | ICD-10-CM

## 2021-01-14 DIAGNOSIS — E7800 Pure hypercholesterolemia, unspecified: Secondary | ICD-10-CM

## 2021-01-14 DIAGNOSIS — R0602 Shortness of breath: Secondary | ICD-10-CM | POA: Diagnosis not present

## 2021-01-14 DIAGNOSIS — I10 Essential (primary) hypertension: Secondary | ICD-10-CM

## 2021-01-14 DIAGNOSIS — R072 Precordial pain: Secondary | ICD-10-CM

## 2021-01-14 DIAGNOSIS — I5042 Chronic combined systolic (congestive) and diastolic (congestive) heart failure: Secondary | ICD-10-CM | POA: Diagnosis not present

## 2021-01-14 MED ORDER — FUROSEMIDE 20 MG PO TABS: 20.0000 mg | ORAL_TABLET | Freq: Every day | ORAL | 3 refills | Status: DC

## 2021-01-14 MED ORDER — SPIRONOLACTONE 25 MG PO TABS: 12.5000 mg | ORAL_TABLET | Freq: Every day | ORAL | 3 refills | Status: DC

## 2021-01-14 NOTE — Patient Instructions (Signed)
Medication Instructions:   START FUROSEMIDE 20 MG ONCE DAILY  START SPIRONOLACTONE 12.5 MG ONCE DAILY= 1/2 OF THE 25 MG TABLET ONCE DAILY  *If you need a refill on your cardiac medications before your next appointment, please call your pharmacy*   Lab Work:  Your physician recommends that you return for lab work in: [place]  If you have labs (blood work) drawn today and your tests are completely normal, you will receive your results only by: Marland Kitchen MyChart Message (if you have MyChart) OR . A paper copy in the mail If you have any lab test that is abnormal or we need to change your treatment, we will call you to review the results.   Testing/Procedures:  Your physician has requested that you have a lexiscan myoview. For further information please visit [email or web address]. Please follow instruction sheet, as given.[place]     Follow-Up: At [facility], you and your health needs are our priority.  As part of our continuing mission to provide you with exceptional heart care, we have created designated Provider Care Teams.  These Care Teams include your primary Cardiologist (physician) and Advanced Practice Providers (APPs -  Physician Assistants and Nurse Practitioners) who all work together to provide you with the care you need, when you need it.  We recommend signing up for the patient portal called "MyChart".  Sign up information is provided on this After Visit Summary.  MyChart is used to connect with patients for Virtual Visits (Telemedicine).  Patients are able to view lab/test results, encounter notes, upcoming appointments, etc.  Non-urgent messages can be sent to your provider as well.   To learn more about what you can do with MyChart, go to [email or web address].    Your next appointment:   3 month(s)  The format for your next appointment:   In Person  Provider:   Kirk Ruths, MD

## 2021-01-17 ENCOUNTER — Telehealth: Payer: Self-pay

## 2021-01-17 ENCOUNTER — Other Ambulatory Visit: Payer: Self-pay

## 2021-01-17 NOTE — Telephone Encounter (Signed)
Orma Render, CMA     There is a conflict with patients Ezetimibe 10mg  due to patient taking it every other day but Rx states daily. [employer] will like clarification.     Left message for pt to call

## 2021-01-20 ENCOUNTER — Other Ambulatory Visit: Payer: Self-pay

## 2021-01-20 ENCOUNTER — Encounter: Payer: Self-pay | Admitting: *Deleted

## 2021-01-20 MED ORDER — EZETIMIBE 10 MG PO TABS: 10.0000 mg | ORAL_TABLET | Freq: Every day | ORAL | 3 refills | Status: DC

## 2021-01-20 MED ORDER — EZETIMIBE 10 MG PO TABS: 10.0000 mg | ORAL_TABLET | Freq: Every day | ORAL | 1 refills | Status: DC

## 2021-01-20 NOTE — Addendum Note (Signed)
Addended by: Cristopher Estimable on: 01/20/2021 12:59 PM   Modules accepted: Orders

## 2021-01-20 NOTE — Telephone Encounter (Signed)
Spoke with pt, he reports he is taking the ezetimibe once daily. New script sent to the pharmacy

## 2021-01-20 NOTE — Telephone Encounter (Signed)
Patient was retuning call, he stated if he is unavailable please leave detail vm

## 2021-01-23 ENCOUNTER — Telehealth (HOSPITAL_COMMUNITY): Payer: Self-pay | Admitting: *Deleted

## 2021-01-23 NOTE — Telephone Encounter (Signed)
Patient given detailed instructions per Myocardial Perfusion Study Information Sheet for the test on 01/31/21 at 10:45. Patient notified to arrive 15 minutes early and that it is imperative to arrive on time for appointment to keep from having the test rescheduled.  If you need to cancel or reschedule your appointment, please call the office within 24 hours of your appointment. . Patient verbalized understanding.[name] [name]

## 2021-01-24 ENCOUNTER — Other Ambulatory Visit (HOSPITAL_COMMUNITY)
Admission: RE | Admit: 2021-01-24 | Discharge: 2021-01-24 | Disposition: A | Discharge status: Home / Self Care | Payer: Medicare HMO | Source: Ambulatory Visit | Attending: Cardiology | Admitting: Cardiology

## 2021-01-24 ENCOUNTER — Other Ambulatory Visit: Payer: Self-pay

## 2021-01-24 DIAGNOSIS — R0602 Shortness of breath: Secondary | ICD-10-CM | POA: Insufficient documentation

## 2021-01-24 DIAGNOSIS — R072 Precordial pain: Secondary | ICD-10-CM | POA: Diagnosis not present

## 2021-01-24 LAB — BASIC METABOLIC PANEL
Anion gap: 6 (ref 5–15)
BUN: 29 mg/dL — ABNORMAL HIGH (ref 8–23)
CO2: 29 mmol/L (ref 22–32)
Calcium: 9.2 mg/dL (ref 8.9–10.3)
Chloride: 102 mmol/L (ref 98–111)
Creatinine, Ser: 1.32 mg/dL — ABNORMAL HIGH (ref 0.61–1.24)
GFR, Estimated: 56 mL/min — ABNORMAL LOW (ref >60)
Glucose, Bld: 134 mg/dL — ABNORMAL HIGH (ref 70–99)
Potassium: 4.9 mmol/L (ref 3.5–5.1)
Sodium: 137 mmol/L (ref 135–145)

## 2021-01-24 LAB — BRAIN NATRIURETIC PEPTIDE: B Natriuretic Peptide: 187 pg/mL — ABNORMAL HIGH (ref 0.0–100.0)

## 2021-01-28 ENCOUNTER — Ambulatory Visit (INDEPENDENT_AMBULATORY_CARE_PROVIDER_SITE_OTHER): Payer: Medicare HMO

## 2021-01-28 DIAGNOSIS — I255 Ischemic cardiomyopathy: Secondary | ICD-10-CM

## 2021-01-29 LAB — CUP PACEART REMOTE DEVICE CHECK
Battery Remaining Longevity: 117 mo
Battery Voltage: 3.03 V
Brady Statistic AP VP Percent: 0.05 %
Brady Statistic AP VS Percent: 53.3 %
Brady Statistic AS VP Percent: 0.03 %
Brady Statistic AS VS Percent: 46.61 %
Brady Statistic RA Percent Paced: 50.55 %
Brady Statistic RV Percent Paced: 0.08 %
Date Time Interrogation Session: 20220621012402
HighPow Impedance: 83 Ohm
Implantable Lead Implant Date: 20210622
Implantable Lead Implant Date: 20210622
Implantable Lead Location: 753859
Implantable Lead Location: 753860
Implantable Lead Model: 5076
Implantable Pulse Generator Implant Date: 20210622
Lead Channel Impedance Value: 342 Ohm
Lead Channel Impedance Value: 418 Ohm
Lead Channel Impedance Value: 513 Ohm
Lead Channel Pacing Threshold Amplitude: 0.5 V
Lead Channel Pacing Threshold Amplitude: 0.625 V
Lead Channel Pacing Threshold Pulse Width: 0.4 ms
Lead Channel Pacing Threshold Pulse Width: 0.4 ms
Lead Channel Sensing Intrinsic Amplitude: 3.5 mV
Lead Channel Sensing Intrinsic Amplitude: 3.5 mV
Lead Channel Sensing Intrinsic Amplitude: 9 mV
Lead Channel Sensing Intrinsic Amplitude: 9 mV
Lead Channel Setting Pacing Amplitude: 1.5 V
Lead Channel Setting Pacing Amplitude: 2.5 V
Lead Channel Setting Pacing Pulse Width: 0.4 ms
Lead Channel Setting Sensing Sensitivity: 0.3 mV

## 2021-01-31 ENCOUNTER — Other Ambulatory Visit: Payer: Self-pay

## 2021-01-31 ENCOUNTER — Ambulatory Visit (HOSPITAL_COMMUNITY): Payer: Medicare HMO | Attending: Cardiovascular Disease

## 2021-01-31 DIAGNOSIS — R0602 Shortness of breath: Secondary | ICD-10-CM | POA: Diagnosis not present

## 2021-01-31 DIAGNOSIS — R072 Precordial pain: Secondary | ICD-10-CM | POA: Diagnosis not present

## 2021-01-31 LAB — MYOCARDIAL PERFUSION IMAGING
LV dias vol: 164 mL (ref 62–150)
LV sys vol: 117 mL
Peak HR: 86 {beats}/min
Rest HR: 64 {beats}/min
SDS: 3
SRS: 23
SSS: 26
TID: 0.84

## 2021-01-31 MED ORDER — TECHNETIUM TC 99M TETROFOSMIN IV KIT
10.7000 | PACK | Freq: Once | INTRAVENOUS | Status: AC | PRN
Start: 2021-01-31 — End: 2021-01-31
  Administered 2021-01-31: 10.7 via INTRAVENOUS
  Filled 2021-01-31: qty 11

## 2021-01-31 MED ORDER — REGADENOSON 0.4 MG/5ML IV SOLN
0.4000 mg | Freq: Once | INTRAVENOUS | Status: AC
  Administered 2021-01-31: 0.4 mg via INTRAVENOUS

## 2021-01-31 MED ORDER — TECHNETIUM TC 99M TETROFOSMIN IV KIT
32.9000 | PACK | Freq: Once | INTRAVENOUS | Status: AC | PRN
  Administered 2021-01-31: 32.9 via INTRAVENOUS
  Filled 2021-01-31: qty 33

## 2021-02-05 ENCOUNTER — Ambulatory Visit (INDEPENDENT_AMBULATORY_CARE_PROVIDER_SITE_OTHER): Payer: Medicare HMO | Admitting: *Deleted

## 2021-02-05 DIAGNOSIS — Z5181 Encounter for therapeutic drug level monitoring: Secondary | ICD-10-CM | POA: Diagnosis not present

## 2021-02-05 DIAGNOSIS — I240 Acute coronary thrombosis not resulting in myocardial infarction: Secondary | ICD-10-CM

## 2021-02-05 LAB — POCT INR: INR: 2.5 (ref 2.0–3.0)

## 2021-02-05 NOTE — Patient Instructions (Signed)
Continue 1 tablet daily except 1/2 tablet on Fridays Recheck in 6 weeks

## 2021-02-17 ENCOUNTER — Ambulatory Visit (INDEPENDENT_AMBULATORY_CARE_PROVIDER_SITE_OTHER): Payer: Medicare HMO

## 2021-02-17 DIAGNOSIS — I5042 Chronic combined systolic (congestive) and diastolic (congestive) heart failure: Secondary | ICD-10-CM | POA: Diagnosis not present

## 2021-02-17 DIAGNOSIS — Z95810 Presence of automatic (implantable) cardiac defibrillator: Secondary | ICD-10-CM | POA: Diagnosis not present

## 2021-02-17 NOTE — Progress Notes (Signed)
Remote ICD transmission.   

## 2021-02-19 NOTE — Progress Notes (Signed)
EPIC Encounter for ICM Monitoring  Patient Name: NIAM NEPOMUCENO is a 75 y.o. male Date: 02/19/2021 Primary Care Physican: Biagio Borg, MD Primary Cardiologist: [name] Electrophysiologist: Allred 01/13/2021 Weight:  224 lbs                                                             Spoke with patient and heart failure questions reviewed.  Pt asymptomatic for fluid accumulation and feeing well.    Optivol thoracic impedance normal.   No diuretic   Recommendations:  No changes and encouraged to call if experiencing any fluid symptoms.   Follow-up plan: ICM clinic phone appointment on 03/24/2021.   91 day device clinic remote transmission 04/29/2021.     EP/Cardiology Office Visits:  Last EP visit was 05/08/2020 with Dr Rayann Heman and advised of 1 year follow up with Oda Kilts, PA.   Copy of ICM check sent to Dr. Rayann Heman.   3 month ICM trend: 02/17/2021.    1 Year ICM trend:       Rosalene Billings, RN 02/19/2021 2:30 PM

## 2021-03-05 ENCOUNTER — Other Ambulatory Visit: Payer: Self-pay | Admitting: Cardiology

## 2021-03-20 ENCOUNTER — Ambulatory Visit (INDEPENDENT_AMBULATORY_CARE_PROVIDER_SITE_OTHER): Payer: Medicare HMO | Admitting: *Deleted

## 2021-03-20 DIAGNOSIS — I240 Acute coronary thrombosis not resulting in myocardial infarction: Secondary | ICD-10-CM

## 2021-03-20 DIAGNOSIS — Z5181 Encounter for therapeutic drug level monitoring: Secondary | ICD-10-CM | POA: Diagnosis not present

## 2021-03-20 LAB — POCT INR: INR: 3.6 — AB (ref 2.0–3.0)

## 2021-03-20 NOTE — Patient Instructions (Signed)
Hold warfarin tonight then resume 1 tablet daily except 1/2 tablet on Fridays Recheck in 3 weeks

## 2021-03-24 ENCOUNTER — Ambulatory Visit (INDEPENDENT_AMBULATORY_CARE_PROVIDER_SITE_OTHER): Payer: Medicare HMO

## 2021-03-24 DIAGNOSIS — I5042 Chronic combined systolic (congestive) and diastolic (congestive) heart failure: Secondary | ICD-10-CM | POA: Diagnosis not present

## 2021-03-24 DIAGNOSIS — Z95810 Presence of automatic (implantable) cardiac defibrillator: Secondary | ICD-10-CM

## 2021-03-26 NOTE — Progress Notes (Signed)
EPIC Encounter for ICM Monitoring  Patient Name: [name] [name] is a 75 y.o. male Date: 03/26/2021 Primary Care Physican: Biagio Borg, MD Primary Cardiologist: [name] Electrophysiologist: Allred 03/26/2021 Weight:  221 lbs                                                             Spoke with patient and heart failure questions reviewed.  Pt asymptomatic for fluid accumulation and feeing well.    Optivol thoracic impedance normal.   Prescribed: Furosemide 20 mg take 1 tablet daily   Recommendations:  No changes and encouraged to call if experiencing any fluid symptoms.   Follow-up plan: ICM clinic phone appointment on 04/30/2021.   91 day device clinic remote transmission 04/29/2021.     EP/Cardiology Office Visits:  Last EP visit was 05/08/2020 with Dr Rayann Heman and advised of 1 year follow up with Oda Kilts, PA.   Copy of ICM check sent to Dr. Rayann Heman.   3 month ICM trend: 03/26/2021.    1 Year ICM trend:       Rosalene Billings, RN 03/26/2021 4:09 PM

## 2021-04-10 ENCOUNTER — Other Ambulatory Visit: Payer: Self-pay

## 2021-04-10 ENCOUNTER — Ambulatory Visit (INDEPENDENT_AMBULATORY_CARE_PROVIDER_SITE_OTHER): Payer: Medicare HMO | Admitting: *Deleted

## 2021-04-10 DIAGNOSIS — I240 Acute coronary thrombosis not resulting in myocardial infarction: Secondary | ICD-10-CM | POA: Diagnosis not present

## 2021-04-10 DIAGNOSIS — Z5181 Encounter for therapeutic drug level monitoring: Secondary | ICD-10-CM

## 2021-04-10 LAB — POCT INR: INR: 2.4 (ref 2.0–3.0)

## 2021-04-10 NOTE — Patient Instructions (Signed)
Continue warfarin 1 tablet daily except 1/2 tablet on Fridays Recheck in 6 weeks

## 2021-04-29 ENCOUNTER — Ambulatory Visit (INDEPENDENT_AMBULATORY_CARE_PROVIDER_SITE_OTHER): Payer: Medicare HMO

## 2021-04-29 DIAGNOSIS — I255 Ischemic cardiomyopathy: Secondary | ICD-10-CM

## 2021-04-29 LAB — CUP PACEART REMOTE DEVICE CHECK
Battery Remaining Longevity: 112 mo
Battery Voltage: 3.02 V
Brady Statistic AP VP Percent: 0.03 %
Brady Statistic AP VS Percent: 53.98 %
Brady Statistic AS VP Percent: 0.02 %
Brady Statistic AS VS Percent: 45.97 %
Brady Statistic RA Percent Paced: 52.91 %
Brady Statistic RV Percent Paced: 0.04 %
Date Time Interrogation Session: 20220920012404
HighPow Impedance: 78 Ohm
Implantable Lead Implant Date: 20210622
Implantable Lead Implant Date: 20210622
Implantable Lead Location: 753859
Implantable Lead Location: 753860
Implantable Lead Model: 5076
Implantable Pulse Generator Implant Date: 20210622
Lead Channel Impedance Value: 304 Ohm
Lead Channel Impedance Value: 399 Ohm
Lead Channel Impedance Value: 399 Ohm
Lead Channel Pacing Threshold Amplitude: 0.375 V
Lead Channel Pacing Threshold Amplitude: 0.5 V
Lead Channel Pacing Threshold Pulse Width: 0.4 ms
Lead Channel Pacing Threshold Pulse Width: 0.4 ms
Lead Channel Sensing Intrinsic Amplitude: 3.5 mV
Lead Channel Sensing Intrinsic Amplitude: 3.5 mV
Lead Channel Sensing Intrinsic Amplitude: 6.875 mV
Lead Channel Sensing Intrinsic Amplitude: 6.875 mV
Lead Channel Setting Pacing Amplitude: 1.5 V
Lead Channel Setting Pacing Amplitude: 2.5 V
Lead Channel Setting Pacing Pulse Width: 0.4 ms
Lead Channel Setting Sensing Sensitivity: 0.3 mV

## 2021-04-30 ENCOUNTER — Telehealth: Payer: Self-pay

## 2021-04-30 ENCOUNTER — Ambulatory Visit (INDEPENDENT_AMBULATORY_CARE_PROVIDER_SITE_OTHER): Payer: Medicare HMO

## 2021-04-30 DIAGNOSIS — I5042 Chronic combined systolic (congestive) and diastolic (congestive) heart failure: Secondary | ICD-10-CM

## 2021-04-30 DIAGNOSIS — Z95810 Presence of automatic (implantable) cardiac defibrillator: Secondary | ICD-10-CM | POA: Diagnosis not present

## 2021-04-30 NOTE — Progress Notes (Signed)
Spoke with patient and heart failure questions reviewed.  Pt asymptomatic for fluid accumulation and feeling well.  He has a cold but other than that has no complaints.

## 2021-04-30 NOTE — Telephone Encounter (Signed)
Remote ICM transmission received.  Attempted call to patient regarding ICM remote transmission and left detailed message per DPR.  Advised to return call for any fluid symptoms or questions. Next ICM remote transmission scheduled 06/02/2021.

## 2021-04-30 NOTE — Progress Notes (Addendum)
EPIC Encounter for ICM Monitoring  Patient Name: [name] [name] is a 75 y.o. male Date: 04/30/2021 Primary Care Physican: Biagio Borg, MD Primary Cardiologist: [name] Electrophysiologist: Allred 03/26/2021 Weight:  221 lbs                                                             Attempted call to patient and unable to reach.  Left detailed message per DPR regarding transmission. Transmission reviewed.     Optivol thoracic impedance normal.   Prescribed: Furosemide 20 mg take 1 tablet daily   Recommendations:  Left voice mail with ICM number and encouraged to call if experiencing any fluid symptoms.   Follow-up plan: ICM clinic phone appointment on 06/02/2021.   91 day device clinic remote transmission 07/29/2021.     EP/Cardiology Office Visits:  05/08/2021 with Dr [name].  Last EP visit was 05/08/2020 with Dr Rayann Heman and advised of 1 year follow up with Oda Kilts, PA.   Copy of ICM check sent to Dr. Rayann Heman.    3 month ICM trend: 04/29/2021.    1 Year ICM trend:       Rosalene Billings, RN 04/30/2021 2:35 PM

## 2021-05-05 NOTE — Progress Notes (Signed)
HPI: FU CAD; history dates back to 1998 when he had a myocardial infarction. He subsequently underwent minimally invasive LIMA to his LAD. Cardiac MRI July 2020 showed ejection fraction approximately 30% and apical thrombus noted. Mild aortic and mitral regurgitation. Had ICD placed June 2021.  Most recent echocardiogram May 2022 showed ejection fraction 30 to 35%, mild mitral regurgitation, mild aortic stenosis, mild aortic insufficiency.  Nuclear study June 2022 showed ejection fraction 29% and infarct but no ischemia.  Since I last saw him, he has dyspnea with more vigorous activities but not routine activities.  No orthopnea, PND, pedal edema, chest pain or syncope.  He does describe fatigue.  Current Outpatient Medications  Medication Sig Dispense Refill   ezetimibe (ZETIA) 10 MG tablet Take 1 tablet (10 mg total) by mouth daily. Patient takes every other day 90 tablet 3   meloxicam (MOBIC) 15 MG tablet Take 15 mg by mouth daily.      metoprolol succinate (TOPROL-XL) 50 MG 24 hr tablet TAKE 1 TABLET BY MOUTH DAILY. TAKE WITH OR IMMEDIATELY FOLLOWING A MEAL. 90 tablet 3   Multiple Vitamins-Minerals (MULTIVITAMIN WITH MINERALS) tablet Take 1 tablet by mouth daily. Centrum silver     rosuvastatin (CRESTOR) 5 MG tablet TAKE 1 TABLET EVERY OTHER DAY 45 tablet 3   triamcinolone (NASACORT) 55 MCG/ACT AERO nasal inhaler Place 2 sprays into the nose daily. 3 Inhaler 3   valsartan (DIOVAN) 320 MG tablet TAKE 1 TABLET DAILY. 90 tablet 3   warfarin (COUMADIN) 5 MG tablet TAKE 1 TABLET EVERY DAY AT 4 PM 90 tablet 4   furosemide (LASIX) 20 MG tablet Take 1 tablet (20 mg total) by mouth daily. 90 tablet 3   spironolactone (ALDACTONE) 25 MG tablet Take 0.5 tablets (12.5 mg total) by mouth daily. 45 tablet 3   No current facility-administered medications for this visit.     Past Medical History:  Diagnosis Date   ALLERGIC RHINITIS 10/31/2007   Qualifier: Diagnosis of  By: Jenny Reichmann MD, Hunt Oris     Allergy    CHOLECYSTECTOMY, HX OF 06/04/2007   Qualifier: Diagnosis of  By: Danny Lawless CMA, [place]     COLONIC POLYPS, HX OF 10/31/2007   Qualifier: Diagnosis of  By: Jenny Reichmann MD, [place] ARTERY BYPASS GRAFT, HX OF 06/04/2007   Qualifier: Diagnosis of  By: [place], [place]     CORONARY ARTERY DISEASE 06/04/2007   Qualifier: Diagnosis of  By: Danny Lawless CMA, [place]     Diverticulosis    Erectile dysfunction    Eye twitch    right eye since chilhood    HYPERLIPIDEMIA 06/04/2007   Qualifier: Diagnosis of  By: [place], [place]     Hypersomnolence    HYPERTENSION 06/04/2007   Qualifier: Diagnosis of  By: [place], [place]     Hypothyroidism    ISCHEMIC CARDIOMYOPATHY 06/04/2007   Qualifier: Diagnosis of  By: Danny Lawless CMA, [place]     Myocardial infarction ([place])    per patient , his occurred in 1998    Osteoarthritis    PLMD (periodic limb movement disorder)    Sleep apnea    no cpap' per patient , "i was checked for it and they said i didnt have it "    Vertigo    Vitamin D deficiency     Past Surgical History:  Procedure Laterality Date   CHOLECYSTECTOMY     COLONOSCOPY     CORONARY ARTERY BYPASS GRAFT  ICD IMPLANT N/A 01/30/2020   Procedure: ICD IMPLANT;  Surgeon: Thompson Grayer, MD;  Location: [facility];  Service: Cardiovascular;  Laterality: N/A;   KNEE ARTHROPLASTY Left 08/05/2017   Procedure: LEFT TOTAL KNEE ARTHROPLASTY WITH COMPUTER NAVIGATION;  Surgeon: Rod Can, MD;  Location: [facility];  Service: Orthopedics;  Laterality: Left;  Needs RNFA   PENILE PROSTHESIS IMPLANT     REPLACEMENT TOTAL KNEE  06/12/2013   spinal cyst     THORACOTOMY     left anterior; wound exploration and debridement   TONSILLECTOMY AND ADENOIDECTOMY     age 4    Social History   Socioeconomic History   Marital status: Married    Spouse name: Not on file   Number of children: 3   Years of education: Not on file   Highest education level: Not on file  Occupational  History   Occupation: truck Education administrator: [name]  Tobacco Use   Smoking status: Former    Packs/day: 4.00    Years: 17.00    Pack years: 68.00    Types: Cigarettes    Quit date: 05/07/1981    Years since quitting: 40.0   Smokeless tobacco: Never   Tobacco comments:    Pt states that he would let most of them "burn" pt states that he used anywhere between 4-5PPD  Vaping Use   Vaping Use: Never used  Substance and Sexual Activity   Alcohol use: Yes    Alcohol/week: 0.0 standard drinks    Comment: rare   Drug use: No   Sexual activity: Not on file  Other Topics Concern   Not on file  Social History Narrative   Lives in [place] with spouse   Retired Administrator   Social Determinants of Radio broadcast assistant Strain: Not on file  Food Insecurity: Not on file  Transportation Needs: Not on file  Physical Activity: Not on file  Stress: Not on file  Social Connections: Not on file  Intimate Partner Violence: Not on file    Family History  Problem Relation Age of Onset   Stomach cancer Mother    Lung cancer Father    Heart disease Brother        first MI at 40yo, now 82 for transplant list/ ICM   Colon cancer Maternal Grandfather    Esophageal cancer Neg Hx    Pancreatic cancer Neg Hx    Prostate cancer Neg Hx    Rectal cancer Neg Hx     ROS: Pain in right leg with sitting but not ambulation; no fevers or chills, productive cough, hemoptysis, dysphasia, odynophagia, melena, hematochezia, dysuria, hematuria, rash, seizure activity, orthopnea, PND, pedal edema, claudication. Remaining systems are negative.  Physical Exam: Well-developed well-nourished in no acute distress.  Skin is warm and dry.  HEENT is normal.  Neck is supple.  Chest is clear to auscultation with normal expansion.  Cardiovascular exam is regular rate and rhythm.  Abdominal exam nontender or distended. No masses palpated. Extremities show no edema. neuro grossly  intact  ECG-sinus rhythm with intermittent atrial and ventricular pacing and PACs, previous anterior infarct, left axis deviation.  Personally reviewed  A/P  1 coronary artery disease-continue medical therapy with statin.  No aspirin given need for anticoagulation.  2 ischemic cardiomyopathy/chronic systolic congestive heart failure-continue ARB and beta-blocker.  He did not tolerate Entresto.  Continue diuretic at present dose.  Add Jardiance 10 mg daily.  Check potassium and  renal function in 1 week.  3 history of ICD-managed by electrophysiology.  4 apical thrombus-continue Coumadin with goal INR 2-3.  5 hypertension-blood pressure controlled.  Continue present medications.  6 hyperlipidemia-continue statin.  Note he did not tolerate higher doses previously.  Check lipids and liver.  Kirk Ruths, MD

## 2021-05-06 NOTE — Progress Notes (Signed)
Remote ICD transmission.   

## 2021-05-08 ENCOUNTER — Encounter: Payer: Self-pay | Admitting: Cardiology

## 2021-05-08 ENCOUNTER — Other Ambulatory Visit: Payer: Self-pay

## 2021-05-08 ENCOUNTER — Ambulatory Visit: Payer: Medicare HMO | Admitting: Cardiology

## 2021-05-08 VITALS — BP 120/76 | HR 62 | Ht 70.0 in | Wt 221.2 lb

## 2021-05-08 DIAGNOSIS — I240 Acute coronary thrombosis not resulting in myocardial infarction: Secondary | ICD-10-CM | POA: Diagnosis not present

## 2021-05-08 DIAGNOSIS — I5042 Chronic combined systolic (congestive) and diastolic (congestive) heart failure: Secondary | ICD-10-CM | POA: Diagnosis not present

## 2021-05-08 DIAGNOSIS — I255 Ischemic cardiomyopathy: Secondary | ICD-10-CM | POA: Diagnosis not present

## 2021-05-08 DIAGNOSIS — E7800 Pure hypercholesterolemia, unspecified: Secondary | ICD-10-CM

## 2021-05-08 DIAGNOSIS — I10 Essential (primary) hypertension: Secondary | ICD-10-CM

## 2021-05-08 DIAGNOSIS — I2510 Atherosclerotic heart disease of native coronary artery without angina pectoris: Secondary | ICD-10-CM | POA: Diagnosis not present

## 2021-05-08 MED ORDER — EMPAGLIFLOZIN 10 MG PO TABS: 10.0000 mg | ORAL_TABLET | Freq: Every day | ORAL | 11 refills | Status: DC

## 2021-05-08 NOTE — Patient Instructions (Signed)
Medication Instructions:   START JARDIANCE 10 MG ONCE DAILY  *If you need a refill on your cardiac medications before your next appointment, please call your pharmacy*   Lab Work:  Your physician recommends that you return for lab work in: [facility]  If you have labs (blood work) drawn today and your tests are completely normal, you will receive your results only by: [place] (if you have MyChart) OR A paper copy in the mail If you have any lab test that is abnormal or we need to change your treatment, we will call you to review the results.   Follow-Up: At [facility], you and your health needs are our priority.  As part of our continuing mission to provide you with exceptional heart care, we have created designated Provider Care Teams.  These Care Teams include your primary Cardiologist (physician) and Advanced Practice Providers (APPs -  Physician Assistants and Nurse Practitioners) who all work together to provide you with the care you need, when you need it.  We recommend signing up for the patient portal called "MyChart".  Sign up information is provided on this After Visit Summary.  MyChart is used to connect with patients for Virtual Visits (Telemedicine).  Patients are able to view lab/test results, encounter notes, upcoming appointments, etc.  Non-urgent messages can be sent to your provider as well.   To learn more about what you can do with MyChart, go to [email or web address].    Your next appointment:   6 month(s)  The format for your next appointment:   In Person  Provider:   Kirk Ruths, MD

## 2021-05-09 ENCOUNTER — Other Ambulatory Visit: Payer: Self-pay

## 2021-05-21 ENCOUNTER — Other Ambulatory Visit (HOSPITAL_COMMUNITY)
Admission: RE | Admit: 2021-05-21 | Discharge: 2021-05-21 | Disposition: A | Discharge status: Home / Self Care | Payer: Medicare HMO | Source: Ambulatory Visit | Attending: Cardiology | Admitting: Cardiology

## 2021-05-21 ENCOUNTER — Other Ambulatory Visit: Payer: Self-pay

## 2021-05-21 DIAGNOSIS — I5042 Chronic combined systolic (congestive) and diastolic (congestive) heart failure: Secondary | ICD-10-CM | POA: Diagnosis not present

## 2021-05-21 DIAGNOSIS — E7800 Pure hypercholesterolemia, unspecified: Secondary | ICD-10-CM | POA: Insufficient documentation

## 2021-05-21 LAB — COMPREHENSIVE METABOLIC PANEL
ALT: 21 U/L (ref 0–44)
AST: 21 U/L (ref 15–41)
Albumin: 4 g/dL (ref 3.5–5.0)
Alkaline Phosphatase: 49 U/L (ref 38–126)
Anion gap: 4 — ABNORMAL LOW (ref 5–15)
BUN: 29 mg/dL — ABNORMAL HIGH (ref 8–23)
CO2: 29 mmol/L (ref 22–32)
Calcium: 8.8 mg/dL — ABNORMAL LOW (ref 8.9–10.3)
Chloride: 103 mmol/L (ref 98–111)
Creatinine, Ser: 1.36 mg/dL — ABNORMAL HIGH (ref 0.61–1.24)
GFR, Estimated: 54 mL/min — ABNORMAL LOW (ref >60)
Glucose, Bld: 118 mg/dL — ABNORMAL HIGH (ref 70–99)
Potassium: 4.9 mmol/L (ref 3.5–5.1)
Sodium: 136 mmol/L (ref 135–145)
Total Bilirubin: 0.9 mg/dL (ref 0.3–1.2)
Total Protein: 7.1 g/dL (ref 6.5–8.1)

## 2021-05-21 LAB — LIPID PANEL
Cholesterol: 158 mg/dL (ref 0–200)
HDL: 37 mg/dL — ABNORMAL LOW (ref >40)
LDL Cholesterol: 92 mg/dL (ref 0–99)
Total CHOL/HDL Ratio: 4.3 RATIO
Triglycerides: 144 mg/dL (ref <150)
VLDL: 29 mg/dL (ref 0–40)

## 2021-05-22 ENCOUNTER — Ambulatory Visit (INDEPENDENT_AMBULATORY_CARE_PROVIDER_SITE_OTHER): Payer: Medicare HMO | Admitting: *Deleted

## 2021-05-22 DIAGNOSIS — I240 Acute coronary thrombosis not resulting in myocardial infarction: Secondary | ICD-10-CM

## 2021-05-22 DIAGNOSIS — Z5181 Encounter for therapeutic drug level monitoring: Secondary | ICD-10-CM | POA: Diagnosis not present

## 2021-05-22 LAB — POCT INR: INR: 2.7 (ref 2.0–3.0)

## 2021-05-22 NOTE — Patient Instructions (Signed)
Continue warfarin 1 tablet daily except 1/2 tablet on Fridays Recheck in 6 weeks

## 2021-05-26 ENCOUNTER — Encounter: Payer: Self-pay | Admitting: *Deleted

## 2021-05-28 ENCOUNTER — Telehealth: Payer: Self-pay

## 2021-05-28 ENCOUNTER — Other Ambulatory Visit: Payer: Self-pay | Admitting: Cardiology

## 2021-05-28 ENCOUNTER — Other Ambulatory Visit: Payer: Self-pay | Admitting: Internal Medicine

## 2021-05-28 DIAGNOSIS — I2510 Atherosclerotic heart disease of native coronary artery without angina pectoris: Secondary | ICD-10-CM

## 2021-05-28 NOTE — Telephone Encounter (Signed)
Ok to ask pt to reqeust coumadin per his coumadin clinic

## 2021-05-28 NOTE — Telephone Encounter (Signed)
   [facility] Pre-operative Risk Assessment    Patient Name: [name] [name]  DOB: 07-11-1946 MRN: [id number]  HEARTCARE STAFF:  - IMPORTANT!!!!!! Under Visit Info/Reason for Call, type in Other and utilize the format Clearance MM/DD/YY or Clearance TBD. Do not use dashes or single digits. - Please review there is not already an duplicate clearance open for this procedure. - If request is for dental extraction, please clarify the # of teeth to be extracted. - If the patient is currently at the dentist's office, call Pre-Op Callback Staff (MA/nurse) to input urgent request.  - If the patient is not currently in the dentist office, please route to the Pre-Op pool.  Request for surgical clearance:  What type of surgery is being performed? LESI L5-S1  When is this surgery scheduled? TBD  What type of clearance is required (medical clearance vs. Pharmacy clearance to hold med vs. Both)? Both  Are there any medications that need to be held prior to surgery and how long? Warfarin 5 days  Practice name and name of physician performing surgery? [facility] Dr. Lenord Carbo  What is the office phone number? [phone]   7.   What is the office fax number? [phone]  8.   Anesthesia type (None, local, MAC, general) ? General   Monia Pouch 05/28/2021, 3:38 PM  _________________________________________________________________   (provider comments below)

## 2021-05-30 NOTE — Telephone Encounter (Signed)
   Primary Cardiologist: Kirk Ruths, MD  Chart reviewed as part of pre-operative protocol coverage. Given past medical history and time since last visit, based on ACC/AHA guidelines, [name] [name] would be at acceptable risk for the planned procedure without further cardiovascular testing.   Patient with diagnosis of apical thrombus on warfarin for anticoagulation.     Procedure: LESI L5-S1 Date of procedure: TBD   CrCl 54.8 ml/min Platelet count 122   Patient has had an apical thrombus seen on MRI in 2020. Thrombus not seen on repeat echo.     Per office protocol, patient can hold warfarin for 5 days prior to procedure.  Patient does not require bridging prior to procedure.  I will route this recommendation to the requesting party via Epic fax function and remove from pre-op pool.  Please call with questions.  [name] [name]. Baltazar Pekala NP-C    05/30/2021, 12:47 PM [address] Office [phone] Fax [phone]

## 2021-05-30 NOTE — Telephone Encounter (Signed)
Patient with diagnosis of apical thrombus on warfarin for anticoagulation.    Procedure: LESI L5-S1 Date of procedure: TBD  CrCl 54.8 ml/min Platelet count 122  Patient has had an apical thrombus seen on MRI in 2020. Thrombus not seen on repeat echo.  Per office protocol, patient can hold warfarin for 5 days prior to procedure.    I will confirm with Dr. [name] if bridging is or is not needed.

## 2021-06-02 ENCOUNTER — Ambulatory Visit (INDEPENDENT_AMBULATORY_CARE_PROVIDER_SITE_OTHER): Payer: Medicare HMO

## 2021-06-02 DIAGNOSIS — I5042 Chronic combined systolic (congestive) and diastolic (congestive) heart failure: Secondary | ICD-10-CM | POA: Diagnosis not present

## 2021-06-02 DIAGNOSIS — Z95810 Presence of automatic (implantable) cardiac defibrillator: Secondary | ICD-10-CM

## 2021-06-04 NOTE — Progress Notes (Signed)
EPIC Encounter for ICM Monitoring  Patient Name: [name] [name] is a 75 y.o. male Date: 06/04/2021 Primary Care Physican: Biagio Borg, MD Primary Cardiologist: [name] Electrophysiologist: Allred 06/04/2021 Office Weight: 220 lbs                                                            Spoke with patient and heart failure questions reviewed.  Pt asymptomatic for fluid accumulation and feeling well.   He is seeking treatment for back pain.  Unsure why he may be retaining fluid.  Discussed salt and fluid intake.    Optivol thoracic impedance normal.   Prescribed: Furosemide 20 mg take 1 tablet daily   Recommendations: Recommendation to limit salt intake to 2000 mg daily and fluid intake to 64 oz daily.  Encouraged to call if experiencing any fluid symptoms.    Follow-up plan: ICM clinic phone appointment on 07/14/2021.   91 day device clinic remote transmission 07/29/2021.     EP/Cardiology Office Visits: 11/06/2021 with Dr [name].  Recall 11/10/2020 with Oda Kilts, PA.   Copy of ICM check sent to Dr. Rayann Heman.     3 month ICM trend: 06/02/2021.    1 Year ICM trend:       Rosalene Billings, RN 06/04/2021 4:13 PM

## 2021-06-09 ENCOUNTER — Telehealth: Payer: Self-pay

## 2021-06-09 NOTE — Telephone Encounter (Signed)
    Patient Name: [name] [name]  DOB: [birth year] MRN: [id number]  Primary Cardiologist: Kirk Ruths, MD  Chart reviewed as part of pre-operative protocol coverage.    Unclear if this is a duplicate request.  He was cleared for the same procedure as noted in a telephone encounter 05/28/2021.  Given lack of interval change in history since that time the same recommendations for holding Coumadin remain.  Patient can hold Coumadin 5 days prior to his upcoming spinal injection with plans to restart as soon as he is cleared to do so to by Dr. Davy Pique.  I will route this recommendation to the requesting party via Epic fax function and remove from pre-op pool.  Please call with questions.  [name], PA-C 06/09/2021, 1:10 PM

## 2021-06-09 NOTE — Telephone Encounter (Signed)
   [facility] Pre-operative Risk Assessment    Patient Name: [name] [name]  DOB: 08/30/1945 MRN: [id number]  HEARTCARE STAFF:  - IMPORTANT!!!!!! Under Visit Info/Reason for Call, type in Other and utilize the format Clearance MM/DD/YY or Clearance TBD. Do not use dashes or single digits. - Please review there is not already an duplicate clearance open for this procedure. - If request is for dental extraction, please clarify the # of teeth to be extracted. - If the patient is currently at the dentist's office, call Pre-Op Callback Staff (MA/nurse) to input urgent request.  - If the patient is not currently in the dentist office, please route to the Pre-Op pool.  Request for surgical clearance:  What type of surgery is being performed? LESI L5-SI Right Directed  When is this surgery scheduled? 06/30/2021  What type of clearance is required (medical clearance vs. Pharmacy clearance to hold med vs. Both)? Pharmacy   Are there any medications that need to be held prior to surgery and how long? Warfarin 5 days prior   Practice name and name of physician performing surgery? [facility] Ellery Plunk, MD   What is the office phone number? [phone]   7.   What is the office fax number? [phone]  8.   Anesthesia type (None, local, MAC, general) ? Unknown    [name] [name] 06/09/2021, 11:53 AM  _________________________________________________________________   (provider comments below)

## 2021-06-10 ENCOUNTER — Telehealth: Payer: Self-pay

## 2021-06-10 NOTE — Telephone Encounter (Signed)
Returned patient call per voice mail request.  He is asking about if clearance has been done for an upcoming procedure. Reviewed chart note and advised it was faxed back to the party that requested the clearance on 10/19 and 10/31.  He was unsure who to call.  Advised if any further questions to call Dr Lonia Skinner office and he can also advise the party requesting the clearance to call them as well. He appreciated the call back.

## 2021-06-15 ENCOUNTER — Encounter: Payer: Self-pay | Admitting: Internal Medicine

## 2021-07-07 ENCOUNTER — Ambulatory Visit (INDEPENDENT_AMBULATORY_CARE_PROVIDER_SITE_OTHER): Payer: Medicare HMO | Admitting: *Deleted

## 2021-07-07 DIAGNOSIS — Z23 Encounter for immunization: Secondary | ICD-10-CM

## 2021-07-07 DIAGNOSIS — Z5181 Encounter for therapeutic drug level monitoring: Secondary | ICD-10-CM | POA: Diagnosis not present

## 2021-07-07 DIAGNOSIS — I240 Acute coronary thrombosis not resulting in myocardial infarction: Secondary | ICD-10-CM | POA: Diagnosis not present

## 2021-07-07 LAB — POCT INR: INR: 2.7 (ref 2.0–3.0)

## 2021-07-07 NOTE — Patient Instructions (Signed)
Continue warfarin 1 tablet daily except 1/2 tablet on Fridays Pending spinal  injection in 07/14/21.  Approved to hold warfarin 5 days before procedure.  Last dose 11/29 Restart warfarin the night of procedure taking extra 1/2 tablet x 2 days then resume above dose Recheck 10 days after procedure

## 2021-07-08 ENCOUNTER — Other Ambulatory Visit: Payer: Self-pay | Admitting: Cardiology

## 2021-07-14 ENCOUNTER — Ambulatory Visit (INDEPENDENT_AMBULATORY_CARE_PROVIDER_SITE_OTHER): Payer: Medicare HMO

## 2021-07-14 DIAGNOSIS — Z95810 Presence of automatic (implantable) cardiac defibrillator: Secondary | ICD-10-CM

## 2021-07-14 DIAGNOSIS — I5042 Chronic combined systolic (congestive) and diastolic (congestive) heart failure: Secondary | ICD-10-CM

## 2021-07-16 ENCOUNTER — Encounter: Payer: Self-pay | Admitting: Cardiology

## 2021-07-16 NOTE — Progress Notes (Signed)
EPIC Encounter for ICM Monitoring  Patient Name: [name] [name] is a 75 y.o. male Date: 07/16/2021 Primary Care Physican: Biagio Borg, MD Primary Cardiologist: [name] Electrophysiologist: Allred 07/16/2021 Weight: 220 lbs                                                            Spoke with patient and heart failure questions reviewed.  Pt asymptomatic for fluid accumulation and feeling well.   He has a cough that looks like blood and has my chart message to Dr [name].  Discussed DPR and will have form mailed to be updated.     Optivol thoracic impedance possible fluid accumulation starting 11/23 and returned close to baseline 12/5.   Prescribed: Furosemide 20 mg take 1 tablet daily   Recommendations: Recommendation to limit salt intake to 2000 mg daily and fluid intake to 64 oz daily.  Encouraged to call if experiencing any fluid symptoms.    Follow-up plan: ICM clinic phone appointment on 08/18/2021.   91 day device clinic remote transmission 07/29/2021.     EP/Cardiology Office Visits: 11/06/2021 with Dr [name].  Recall 11/10/2020 with Oda Kilts, PA.   Copy of ICM check sent to Dr. Rayann Heman.     3 month ICM trend: 07/14/2021.    12-14 Month ICM trend:       Rosalene Billings, RN 07/16/2021 1:35 PM

## 2021-07-19 ENCOUNTER — Other Ambulatory Visit: Payer: Self-pay | Admitting: Cardiology

## 2021-07-24 ENCOUNTER — Ambulatory Visit (INDEPENDENT_AMBULATORY_CARE_PROVIDER_SITE_OTHER): Payer: Medicare HMO | Admitting: *Deleted

## 2021-07-24 DIAGNOSIS — Z5181 Encounter for therapeutic drug level monitoring: Secondary | ICD-10-CM | POA: Diagnosis not present

## 2021-07-24 DIAGNOSIS — I240 Acute coronary thrombosis not resulting in myocardial infarction: Secondary | ICD-10-CM | POA: Diagnosis not present

## 2021-07-24 LAB — POCT INR: INR: 2.2 (ref 2.0–3.0)

## 2021-07-24 NOTE — Patient Instructions (Signed)
Continue warfarin 1 tablet daily except 1/2 tablet on Fridays Recheck in 4 wks

## 2021-07-29 ENCOUNTER — Ambulatory Visit (INDEPENDENT_AMBULATORY_CARE_PROVIDER_SITE_OTHER): Payer: Medicare HMO

## 2021-07-29 DIAGNOSIS — I255 Ischemic cardiomyopathy: Secondary | ICD-10-CM | POA: Diagnosis not present

## 2021-07-29 LAB — CUP PACEART REMOTE DEVICE CHECK
Battery Remaining Longevity: 111 mo
Battery Voltage: 3.01 V
Brady Statistic AP VP Percent: 0.03 %
Brady Statistic AP VS Percent: 59.56 %
Brady Statistic AS VP Percent: 0.02 %
Brady Statistic AS VS Percent: 40.39 %
Brady Statistic RA Percent Paced: 58.5 %
Brady Statistic RV Percent Paced: 0.05 %
Date Time Interrogation Session: 20221220022606
HighPow Impedance: 92 Ohm
Implantable Lead Implant Date: 20210622
Implantable Lead Implant Date: 20210622
Implantable Lead Location: 753859
Implantable Lead Location: 753860
Implantable Lead Model: 5076
Implantable Pulse Generator Implant Date: 20210622
Lead Channel Impedance Value: 342 Ohm
Lead Channel Impedance Value: 418 Ohm
Lead Channel Impedance Value: 513 Ohm
Lead Channel Pacing Threshold Amplitude: 0.375 V
Lead Channel Pacing Threshold Amplitude: 0.625 V
Lead Channel Pacing Threshold Pulse Width: 0.4 ms
Lead Channel Pacing Threshold Pulse Width: 0.4 ms
Lead Channel Sensing Intrinsic Amplitude: 5.125 mV
Lead Channel Sensing Intrinsic Amplitude: 5.125 mV
Lead Channel Sensing Intrinsic Amplitude: 8.375 mV
Lead Channel Sensing Intrinsic Amplitude: 8.375 mV
Lead Channel Setting Pacing Amplitude: 1.5 V
Lead Channel Setting Pacing Amplitude: 2.5 V
Lead Channel Setting Pacing Pulse Width: 0.4 ms
Lead Channel Setting Sensing Sensitivity: 0.3 mV

## 2021-08-07 NOTE — Progress Notes (Signed)
Remote ICD transmission.   

## 2021-08-18 ENCOUNTER — Ambulatory Visit (INDEPENDENT_AMBULATORY_CARE_PROVIDER_SITE_OTHER): Payer: Medicare HMO

## 2021-08-18 DIAGNOSIS — I5042 Chronic combined systolic (congestive) and diastolic (congestive) heart failure: Secondary | ICD-10-CM

## 2021-08-18 DIAGNOSIS — Z95810 Presence of automatic (implantable) cardiac defibrillator: Secondary | ICD-10-CM

## 2021-08-19 ENCOUNTER — Ambulatory Visit (INDEPENDENT_AMBULATORY_CARE_PROVIDER_SITE_OTHER): Payer: Medicare HMO | Admitting: *Deleted

## 2021-08-19 DIAGNOSIS — Z5181 Encounter for therapeutic drug level monitoring: Secondary | ICD-10-CM

## 2021-08-19 DIAGNOSIS — I240 Acute coronary thrombosis not resulting in myocardial infarction: Secondary | ICD-10-CM | POA: Diagnosis not present

## 2021-08-19 LAB — POCT INR: INR: 3.5 — AB (ref 2.0–3.0)

## 2021-08-19 NOTE — Patient Instructions (Signed)
Hold warfarin tonight then resume 1 tablet daily except 1/2 tablet on Fridays Recheck in 4 wks

## 2021-08-22 NOTE — Progress Notes (Signed)
EPIC Encounter for ICM Monitoring  Patient Name: [name] [name] is a 76 y.o. male Date: 08/22/2021 Primary Care Physican: Biagio Borg, MD Primary Cardiologist: [name] Electrophysiologist: Allred 07/16/2021 Weight: 220 lbs 08/22/2021 Weight: 206 lbs                                                            Spoke with patient and heart failure questions reviewed.  Pt asymptomatic for fluid accumulation and feeling well.  He has unintentional weight loss of approximately 14 pound since December.         Optivol thoracic impedance possible possible dryness starting 07/21/2021.   Prescribed: Furosemide 20 mg take 1 tablet daily  Confirmed 1/13 he is taking daily. Jardiance 10 mg take 1 tablet before breakfast  Labs: 05/21/2021 Creatinine 1.36, BUN 29, Potassium 4.9, Sodium 136, GFR 56  01/24/2021 Creatinine 1.32, BUN 29, Potassium 4.9, Sodium 137, GFR 54  A complete set of results can be found in Results Review.  Recommendations: Reviewed symptoms of dehydration report.  Copy sent to Dr [name] regarding weight loss and possible Optivol dryness since 12/12.      Follow-up plan: ICM clinic phone appointment on 09/09/2021 to recheck fluid levels.   91 day device clinic remote transmission 10/28/2021.     EP/Cardiology Office Visits: 11/06/2021 with Dr [name].  Recall 11/10/2020 with Oda Kilts, PA.   Copy of ICM check sent to Dr. Rayann Heman.    3 month ICM trend: 08/18/2021.    12-14 Month ICM trend:     Rosalene Billings, RN 08/22/2021 3:08 PM

## 2021-09-08 ENCOUNTER — Encounter: Payer: Self-pay | Admitting: Gastroenterology

## 2021-09-09 ENCOUNTER — Ambulatory Visit (INDEPENDENT_AMBULATORY_CARE_PROVIDER_SITE_OTHER): Payer: Medicare HMO

## 2021-09-09 DIAGNOSIS — I5042 Chronic combined systolic (congestive) and diastolic (congestive) heart failure: Secondary | ICD-10-CM

## 2021-09-09 DIAGNOSIS — Z95810 Presence of automatic (implantable) cardiac defibrillator: Secondary | ICD-10-CM

## 2021-09-10 NOTE — Progress Notes (Signed)
EPIC Encounter for ICM Monitoring  Patient Name: [name] [name] is a 76 y.o. male Date: 09/10/2021 Primary Care Physican: Biagio Borg, MD Primary Cardiologist: [name] Electrophysiologist: Allred 07/16/2021 Weight: 220 lbs 08/22/2021 Weight: 206 lbs 09/10/2021 Weight:  206 lbs                                                            Spoke with patient and heart failure questions reviewed.  Pt asymptomatic for fluid accumulation.  Reports feeling more weak and not feeling well lately.  He eats at restaurants about twice a week and has not been aware of how much salt are in the foods he eats.  He does feel like his mouth is always dry and drinks fluids to help relieve it.      Optivol thoracic impedance trending close to baseline.   Prescribed: Furosemide 20 mg take 1 tablet daily   Jardiance 10 mg take 1 tablet before breakfast   Labs: 05/21/2021 Creatinine 1.36, BUN 29, Potassium 4.9, Sodium 136, GFR 56  01/24/2021 Creatinine 1.32, BUN 29, Potassium 4.9, Sodium 137, GFR 54  A complete set of results can be found in Results Review.   Recommendations:  Encouraged to limit salt and fluid intake.  No changes and encouraged to call if experiencing any fluid symptoms.   Follow-up plan: ICM clinic phone appointment on 10/06/2021 to recheck fluid levels.   91 day device clinic remote transmission 10/28/2021.     EP/Cardiology Office Visits: 11/06/2021 with Dr [name].  Recall 11/10/2020 with Oda Kilts, PA.   Copy of ICM check sent to Dr. Rayann Heman.    3 month ICM trend: 09/09/2021.    12-14 Month ICM trend:     Rosalene Billings, RN 09/10/2021 3:34 PM

## 2021-09-15 DIAGNOSIS — H2513 Age-related nuclear cataract, bilateral: Secondary | ICD-10-CM | POA: Diagnosis not present

## 2021-09-15 DIAGNOSIS — H524 Presbyopia: Secondary | ICD-10-CM | POA: Diagnosis not present

## 2021-09-15 DIAGNOSIS — H5203 Hypermetropia, bilateral: Secondary | ICD-10-CM | POA: Diagnosis not present

## 2021-09-15 DIAGNOSIS — H25013 Cortical age-related cataract, bilateral: Secondary | ICD-10-CM | POA: Diagnosis not present

## 2021-09-15 DIAGNOSIS — H52203 Unspecified astigmatism, bilateral: Secondary | ICD-10-CM | POA: Diagnosis not present

## 2021-09-16 ENCOUNTER — Ambulatory Visit (INDEPENDENT_AMBULATORY_CARE_PROVIDER_SITE_OTHER): Payer: Medicare HMO | Admitting: *Deleted

## 2021-09-16 ENCOUNTER — Other Ambulatory Visit: Payer: Self-pay

## 2021-09-16 DIAGNOSIS — Z5181 Encounter for therapeutic drug level monitoring: Secondary | ICD-10-CM

## 2021-09-16 DIAGNOSIS — I240 Acute coronary thrombosis not resulting in myocardial infarction: Secondary | ICD-10-CM

## 2021-09-16 LAB — POCT INR: INR: 2.4 (ref 2.0–3.0)

## 2021-09-16 NOTE — Patient Instructions (Signed)
Pending colonoscopy.  Not scheduled yet  Has appt with Gastro 2/14 Continue warfarin 1 tablet daily except 1/2 tablet on Fridays Recheck in 4 wks

## 2021-09-23 ENCOUNTER — Encounter: Payer: Self-pay | Admitting: Gastroenterology

## 2021-09-23 ENCOUNTER — Ambulatory Visit (INDEPENDENT_AMBULATORY_CARE_PROVIDER_SITE_OTHER): Payer: No Typology Code available for payment source | Admitting: Gastroenterology

## 2021-09-23 VITALS — BP 124/70 | HR 50 | Ht 71.0 in | Wt 206.0 lb

## 2021-09-23 DIAGNOSIS — Z800 Family history of malignant neoplasm of digestive organs: Secondary | ICD-10-CM | POA: Diagnosis not present

## 2021-09-23 DIAGNOSIS — Z7901 Long term (current) use of anticoagulants: Secondary | ICD-10-CM | POA: Insufficient documentation

## 2021-09-23 DIAGNOSIS — R634 Abnormal weight loss: Secondary | ICD-10-CM | POA: Diagnosis not present

## 2021-09-23 DIAGNOSIS — Z86010 Personal history of colonic polyps: Secondary | ICD-10-CM

## 2021-09-23 NOTE — Progress Notes (Deleted)
09/23/2021 [name] [id number] 02-03-1946   HISTORY OF PRESENT ILLNESS:     Past Medical History:  Diagnosis Date   ALLERGIC RHINITIS 10/31/2007   Qualifier: Diagnosis of  By: Jenny Reichmann MD, [place]    Allergy    CHOLECYSTECTOMY, HX OF 06/04/2007   Qualifier: Diagnosis of  By: Danny Lawless CMA, [place]     COLONIC POLYPS, HX OF 10/31/2007   Qualifier: Diagnosis of  By: Jenny Reichmann MD, [place] ARTERY BYPASS GRAFT, HX OF 06/04/2007   Qualifier: Diagnosis of  By: [place], [place]     CORONARY ARTERY DISEASE 06/04/2007   Qualifier: Diagnosis of  By: Danny Lawless CMA, [place]     Diverticulosis    Erectile dysfunction    Eye twitch    right eye since chilhood    HYPERLIPIDEMIA 06/04/2007   Qualifier: Diagnosis of  By: [place], [place]     Hypersomnolence    HYPERTENSION 06/04/2007   Qualifier: Diagnosis of  By: [place], [place]     Hypothyroidism    ISCHEMIC CARDIOMYOPATHY 06/04/2007   Qualifier: Diagnosis of  By: [place], [place]     Myocardial infarction ([place])    per patient , his occurred in 1998    Osteoarthritis    PLMD (periodic limb movement disorder)    Sleep apnea    no cpap' per patient , "i was checked for it and they said i didnt have it "    Vertigo    Vitamin D deficiency    Past Surgical History:  Procedure Laterality Date   CHOLECYSTECTOMY     COLONOSCOPY     CORONARY ARTERY BYPASS GRAFT     ICD IMPLANT N/A 01/30/2020   Procedure: ICD IMPLANT;  Surgeon: Thompson Grayer, MD;  Location: [facility];  Service: Cardiovascular;  Laterality: N/A;   KNEE ARTHROPLASTY Left 08/05/2017   Procedure: LEFT TOTAL KNEE ARTHROPLASTY WITH COMPUTER NAVIGATION;  Surgeon: Rod Can, MD;  Location: [facility];  Service: Orthopedics;  Laterality: Left;  Needs RNFA   PENILE PROSTHESIS IMPLANT     REPLACEMENT TOTAL KNEE  06/12/2013   spinal cyst     THORACOTOMY     left anterior; wound exploration and debridement   TONSILLECTOMY AND ADENOIDECTOMY     age  76    reports that he quit smoking about 40 years ago. His smoking use included cigarettes. He has a 68.00 pack-year smoking history. He has never used smokeless tobacco. He reports current alcohol use. He reports that he does not use drugs. family history includes COPD in his son; Colon cancer in his maternal grandfather; Heart disease in his brother; Lung cancer in his father; Pancreatic cancer in his brother, cousin, and maternal uncle; Stomach cancer in his mother. Allergies  Allergen Reactions   Ace Inhibitors Cough   Rosuvastatin Other (See Comments)    Muscle pains   Statins Other (See Comments)    Muscle pains      Outpatient Encounter Medications as of 09/23/2021  Medication Sig   Ascorbic Acid (VITA-C PO) Take by mouth. 1000mg  daily   b complex vitamins capsule Take 1 capsule by mouth daily.   empagliflozin (JARDIANCE) 10 MG TABS tablet Take 1 tablet (10 mg total) by mouth daily before breakfast.   ezetimibe (ZETIA) 10 MG tablet TAKE 1 TABLET EVERY DAY   meloxicam (MOBIC) 15 MG tablet Take 15 mg by mouth daily.    metoprolol succinate (TOPROL-XL) 50 MG 24 hr  tablet TAKE 1 TABLET DAILY. TAKE WITH OR IMMEDIATELY FOLLOWING A MEAL.   Multiple Vitamins-Minerals (MULTIVITAMIN WITH MINERALS) tablet Take 1 tablet by mouth daily. Centrum silver   rosuvastatin (CRESTOR) 5 MG tablet TAKE 1 TABLET EVERY OTHER DAY   triamcinolone (NASACORT) 55 MCG/ACT AERO nasal inhaler Place 2 sprays into the nose daily.   valsartan (DIOVAN) 320 MG tablet TAKE 1 TABLET DAILY.   VITAMIN D PO Take by mouth. 125mg  daily   warfarin (COUMADIN) 5 MG tablet TAKE 1 TABLET EVERY DAY AT 4 PM   furosemide (LASIX) 20 MG tablet Take 1 tablet (20 mg total) by mouth daily.   spironolactone (ALDACTONE) 25 MG tablet Take 0.5 tablets (12.5 mg total) by mouth daily.   No facility-administered encounter medications on file as of 09/23/2021.     REVIEW OF SYSTEMS  : All other systems reviewed and negative except where  noted in the History of Present Illness.   PHYSICAL EXAM: BP 124/70    Pulse (!) 50    Ht 5\' 11"  (1.803 m)    Wt 206 lb (93.4 kg)    BMI 28.73 kg/m  General: Well developed white male in no acute distress Head: Normocephalic and atraumatic Eyes:  sclerae anicteric,conjunctive pink. Ears: Normal auditory acuity Neck: Supple, no masses.  Lungs: Clear throughout to auscultation Heart: Regular rate and rhythm Abdomen: Soft, nontender, non distended. No masses or hepatomegaly noted. Normal bowel sounds Rectal: *** Musculoskeletal: Symmetrical with no gross deformities  Skin: No lesions on visible extremities Extremities: No edema  Neurological: Alert oriented x 4, grossly nonfocal Cervical Nodes:  No significant cervical adenopathy Psychological:  Alert and cooperative. Normal mood and affect  ASSESSMENT AND PLAN:    CC:  Biagio Borg, MD

## 2021-09-23 NOTE — Progress Notes (Signed)
09/23/2021 [name] [name] [id number] 1946-07-06   HISTORY OF PRESENT ILLNESS: This is a 76 year old male who is a patient Dr. Celesta [name].  He is actually here today to discuss colonoscopy.  His last colonoscopy was in July 2015 as follows:  1. Moderate diverticulosis was noted throughout the entire examined colon 2. The colon mucosa was otherwise normal - excellent prep - hx adenomas 2005 91) and 2010 (2) all small  Due to his history of adenomas repeat was recommended at 7 years.  He says that he moves his bowels well without any sign of bleeding rectal bleeding.  He is on Coumadin for history of apical thrombus in the setting of congestive heart failure with most recent EF of 29%.  Follows with Dr. [name].  Had to hold coumadin for 5 days back in November for a back injection--no issues with holding it at that time.  He does admit to some nosebleeds and bringing up some dried blood with phlegm in the mornings for the past couple of months.  No vomiting of blood and no black stools.  He does admit to about a 20 pound weight loss over the past several months.  He says that his appetite has been down.  He is not really sure why.  Is also concerned because he has had what he says is 8 family members who passed away from pancreatic cancer, most recently just within the past couple of weeks his brother passed away from this.  He says that he was told that he should be checked for it.  Past Medical History:  Diagnosis Date   ALLERGIC RHINITIS 10/31/2007   Qualifier: Diagnosis of  By: Jenny Reichmann MD, Hunt Oris    Allergy    CHOLECYSTECTOMY, HX OF 06/04/2007   Qualifier: Diagnosis of  By: Danny Lawless CMA, [place]     COLONIC POLYPS, HX OF 10/31/2007   Qualifier: Diagnosis of  By: Jenny Reichmann MD, [place] GRAFT, HX OF 06/04/2007   Qualifier: Diagnosis of  By: [place], [place]     CORONARY ARTERY DISEASE 06/04/2007   Qualifier: Diagnosis of  By: Danny Lawless CMA, [place]     Diverticulosis     Erectile dysfunction    Eye twitch    right eye since chilhood    HYPERLIPIDEMIA 06/04/2007   Qualifier: Diagnosis of  By: [place], [place]     Hypersomnolence    HYPERTENSION 06/04/2007   Qualifier: Diagnosis of  By: [place], [place]     Hypothyroidism    ISCHEMIC CARDIOMYOPATHY 06/04/2007   Qualifier: Diagnosis of  By: Danny Lawless CMA, [place]     Myocardial infarction ([place])    per patient , his occurred in 1998    Osteoarthritis    PLMD (periodic limb movement disorder)    Sleep apnea    no cpap' per patient , "i was checked for it and they said i didnt have it "    Vertigo    Vitamin D deficiency    Past Surgical History:  Procedure Laterality Date   CHOLECYSTECTOMY     COLONOSCOPY     CORONARY ARTERY BYPASS GRAFT     ICD IMPLANT N/A 01/30/2020   Procedure: ICD IMPLANT;  Surgeon: Thompson Grayer, MD;  Location: [facility];  Service: Cardiovascular;  Laterality: N/A;   KNEE ARTHROPLASTY Left 08/05/2017   Procedure: LEFT TOTAL KNEE ARTHROPLASTY WITH COMPUTER NAVIGATION;  Surgeon: Rod Can, MD;  Location: [facility];  Service: Orthopedics;  Laterality: Left;  Needs RNFA   PENILE PROSTHESIS IMPLANT     REPLACEMENT TOTAL KNEE  06/12/2013   spinal cyst     THORACOTOMY     left anterior; wound exploration and debridement   TONSILLECTOMY AND ADENOIDECTOMY     age 46    reports that he quit smoking about 40 years ago. His smoking use included cigarettes. He has a 68.00 pack-year smoking history. He has never used smokeless tobacco. He reports current alcohol use. He reports that he does not use drugs. family history includes COPD in his son; Colon cancer in his maternal grandfather; Heart disease in his brother; Lung cancer in his father; Pancreatic cancer in his brother, cousin, and maternal uncle; Stomach cancer in his mother. Allergies  Allergen Reactions   Ace Inhibitors Cough   Rosuvastatin Other (See Comments)    Muscle pains   Statins Other (See Comments)     Muscle pains      Outpatient Encounter Medications as of 09/23/2021  Medication Sig   Ascorbic Acid (VITA-C PO) Take by mouth. 1000mg  daily   b complex vitamins capsule Take 1 capsule by mouth daily.   empagliflozin (JARDIANCE) 10 MG TABS tablet Take 1 tablet (10 mg total) by mouth daily before breakfast.   ezetimibe (ZETIA) 10 MG tablet TAKE 1 TABLET EVERY DAY   meloxicam (MOBIC) 15 MG tablet Take 15 mg by mouth daily.    metoprolol succinate (TOPROL-XL) 50 MG 24 hr tablet TAKE 1 TABLET DAILY. TAKE WITH OR IMMEDIATELY FOLLOWING A MEAL.   Multiple Vitamins-Minerals (MULTIVITAMIN WITH MINERALS) tablet Take 1 tablet by mouth daily. Centrum silver   rosuvastatin (CRESTOR) 5 MG tablet TAKE 1 TABLET EVERY OTHER DAY   triamcinolone (NASACORT) 55 MCG/ACT AERO nasal inhaler Place 2 sprays into the nose daily.   valsartan (DIOVAN) 320 MG tablet TAKE 1 TABLET DAILY.   VITAMIN D PO Take by mouth. 125mg  daily   warfarin (COUMADIN) 5 MG tablet TAKE 1 TABLET EVERY DAY AT 4 PM   furosemide (LASIX) 20 MG tablet Take 1 tablet (20 mg total) by mouth daily.   spironolactone (ALDACTONE) 25 MG tablet Take 0.5 tablets (12.5 mg total) by mouth daily.   No facility-administered encounter medications on file as of 09/23/2021.     REVIEW OF SYSTEMS  : All other systems reviewed and negative except where noted in the History of Present Illness.   PHYSICAL EXAM: BP 124/70    Pulse (!) 50    Ht 5\' 11"  (1.803 m)    Wt 206 lb (93.4 kg)    BMI 28.73 kg/m  General: Well developed white male in no acute distress Head: Normocephalic and atraumatic Eyes:  Sclerae anicteric, conjunctiva pink. Ears: Normal auditory acuity Lungs: Clear throughout to auscultation; no W/R/R. Heart: Regular rate and rhythm; no M/R/G. Abdomen: Soft, non-distended.  BS present.  Non-tender. Rectal:  Will be done at the time of coloscopy. Musculoskeletal: Symmetrical with no gross deformities  Skin: No lesions on visible  extremities Extremities: No edema  Neurological: Alert oriented x 4, grossly non-focal Psychological:  Alert and cooperative. Normal mood and affect  ASSESSMENT AND PLAN: *Weight loss: Reports about a 20 pound weight loss over the past several months with associated decrease in appetite. *Family history of pancreatic cancer: He reports several family members with pancreatic cancer especially recently and a brother who just recently passed away from it.  He says that he was told that he "needs to be checked for it".  We will plan for CT scan of the abdomen and pelvis with contrast especially in light of his recent weight loss as well.  Will check BMP. *Personal history of colon polyps: Last colonoscopy in 2015 was without polyps, but due to history of adenomas in the past repeat was recommended in 7 years.  Could justify not proceeding due to his age, but with recent weight loss we will plan to proceed.  This will need to be done at [facility] hospital due to low ejection fraction of 29%.  Will plan to schedule this when we have availability.  Await results from above CT scan as well. *Chronic anticoagulation on Coumadin for history of apical thrombus in the setting of ischemic cardiomyopathy/chronic systolic congestive heart failure and history of coronary artery disease.  Follows with Dr. [name].  Will hold coumadin for 5 days prior to endoscopic procedures - will instruct when and how to resume after procedure. Benefits and risks of procedure explained including risks of bleeding, perforation, infection, missed lesions, reactions to medications and possible need for hospitalization and surgery for complications. Additional rare but real risk of stroke or other vascular clotting events off of coumadin also explained and need to seek urgent help if any signs of these problems occur. Will communicate by phone or EMR with patient's prescribing provider, Dr. [name], to confirm that holding coumadin is  reasonable in this case.     CC:  Biagio Borg, MD

## 2021-09-23 NOTE — Patient Instructions (Addendum)
You have been scheduled for a CT scan of the abdomen and pelvis at [facility], 1st floor Radiology. You are scheduled on Friday 10/31/21  at 3 pm. You should arrive 15 minutes prior to your appointment time for registration.   Please follow the written instructions below on the day of your exam:   1) Do not eat anything after 11 am (4 hours prior to your test)   2) Drink 1 bottle of contrast @ 1 pm (2 hours prior to your exam)  Remember to shake well before drinking and do NOT pour over ice.     Drink 1 bottle of contrast @ 2 pm (1 hour prior to your exam)   You may take any medications as prescribed with a small amount of water, if necessary. If you take any of the following medications: METFORMIN, GLUCOPHAGE, GLUCOVANCE, AVANDAMET, RIOMET, FORTAMET, [place] MET, JANUMET, GLUMETZA or METAGLIP, you MAY be asked to HOLD this medication 48 hours AFTER the exam.   The purpose of you drinking the oral contrast is to aid in the visualization of your intestinal tract. The contrast solution may cause some diarrhea. Depending on your individual set of symptoms, you may also receive an intravenous injection of x-ray contrast/dye. Plan on being at [facility] for 45 minutes or longer, depending on the type of exam you are having performed.   If you have any questions regarding your exam or if you need to reschedule, you may call [facility] at [phone] between the hours of 8:00 am and 5:00 pm, Monday-Friday.   Will call to schedule procedure with Dr. Carlean Purl once a date becomes available.

## 2021-10-03 ENCOUNTER — Encounter: Payer: Medicare HMO | Admitting: Internal Medicine

## 2021-10-06 ENCOUNTER — Telehealth: Payer: Self-pay | Admitting: Internal Medicine

## 2021-10-06 ENCOUNTER — Ambulatory Visit (INDEPENDENT_AMBULATORY_CARE_PROVIDER_SITE_OTHER): Payer: Medicare HMO

## 2021-10-06 DIAGNOSIS — Z95810 Presence of automatic (implantable) cardiac defibrillator: Secondary | ICD-10-CM | POA: Diagnosis not present

## 2021-10-06 DIAGNOSIS — I5042 Chronic combined systolic (congestive) and diastolic (congestive) heart failure: Secondary | ICD-10-CM | POA: Diagnosis not present

## 2021-10-06 NOTE — Telephone Encounter (Signed)
We have received an authorization from the [place] to schedule this patient's colonoscopy, but it is one that will need to be done at the hospital.  He has an upcoming CT scan and wasn't sure if you were waiting on those results to set him up.  Please call patient and advise.  Thank you.

## 2021-10-06 NOTE — Telephone Encounter (Signed)
Pt was notified that we have him on our list for the Hospital to be  scheduled and as soon as there is availability we will contact the pt:  Pt verbalized understanding with all questions answered.

## 2021-10-07 NOTE — Progress Notes (Signed)
EPIC Encounter for ICM Monitoring  Patient Name: [name] [name] is a 76 y.o. male Date: 10/07/2021 Primary Care Physican: Biagio Borg, MD Primary Cardiologist: [name] Electrophysiologist: [place] 07/16/2021 Weight: 220 lbs 08/22/2021 Weight: 206 lbs 09/10/2021 Weight:  206 lbs 10/07/2021 Weight: 206 lbs                                                            Spoke with patient and heart failure questions reviewed.  He denies any fluid symptoms.  He reports feeling "swimmy headed" when rising from chair or lying down.  BP has ranges from lowest 100/66 to highest 147/80 in the last 10 days.  He uses the salt shaker at the table and does not follow low salt diet.  Explained how salt effects BP and fluid levels.  He is compliant with taking Furosemide.    Optivol thoracic impedance suggesting possible fluid accumulation since 1/31.  Fluid index > normal threshold starting 2/22.   Prescribed: Furosemide 20 mg take 1 tablet daily   Jardiance 10 mg take 1 tablet before breakfast   Labs: 05/21/2021 Creatinine 1.36, BUN 29, Potassium 4.9, Sodium 136, GFR 56  01/24/2021 Creatinine 1.32, BUN 29, Potassium 4.9, Sodium 137, GFR 54  A complete set of results can be found in Results Review.   Recommendations:  Discussed limiting salt and avoid adding salt to foods.  Encouraged to call if any fluid symptoms.     Follow-up plan: ICM clinic phone appointment on 10/14/2021 to recheck fluid levels.   91 day device clinic remote transmission 10/28/2021.     EP/Cardiology Office Visits: 11/06/2021 with Dr [name].  Recall 11/10/2020 with Oda Kilts, PA.   Copy of ICM check sent to Dr. Rayann Heman and Dr [name] for review and recommendations if needed.    3 month ICM trend: 10/06/2021.    12-14 Month ICM trend:     Rosalene Billings, RN 10/07/2021 2:29 PM

## 2021-10-09 ENCOUNTER — Ambulatory Visit (INDEPENDENT_AMBULATORY_CARE_PROVIDER_SITE_OTHER): Payer: Medicare HMO

## 2021-10-09 ENCOUNTER — Ambulatory Visit (INDEPENDENT_AMBULATORY_CARE_PROVIDER_SITE_OTHER): Payer: Medicare HMO | Admitting: Internal Medicine

## 2021-10-09 ENCOUNTER — Other Ambulatory Visit: Payer: Self-pay

## 2021-10-09 ENCOUNTER — Encounter: Payer: Self-pay | Admitting: Internal Medicine

## 2021-10-09 VITALS — BP 120/68 | HR 60 | Temp 98.3°F | Ht 71.0 in | Wt 206.0 lb

## 2021-10-09 DIAGNOSIS — E538 Deficiency of other specified B group vitamins: Secondary | ICD-10-CM | POA: Diagnosis not present

## 2021-10-09 DIAGNOSIS — E7800 Pure hypercholesterolemia, unspecified: Secondary | ICD-10-CM

## 2021-10-09 DIAGNOSIS — I10 Essential (primary) hypertension: Secondary | ICD-10-CM

## 2021-10-09 DIAGNOSIS — R42 Dizziness and giddiness: Secondary | ICD-10-CM | POA: Insufficient documentation

## 2021-10-09 DIAGNOSIS — Z471 Aftercare following joint replacement surgery: Secondary | ICD-10-CM | POA: Insufficient documentation

## 2021-10-09 DIAGNOSIS — Z0001 Encounter for general adult medical examination with abnormal findings: Secondary | ICD-10-CM | POA: Diagnosis not present

## 2021-10-09 DIAGNOSIS — Z125 Encounter for screening for malignant neoplasm of prostate: Secondary | ICD-10-CM

## 2021-10-09 DIAGNOSIS — R042 Hemoptysis: Secondary | ICD-10-CM

## 2021-10-09 DIAGNOSIS — L570 Actinic keratosis: Secondary | ICD-10-CM | POA: Insufficient documentation

## 2021-10-09 DIAGNOSIS — Z789 Other specified health status: Secondary | ICD-10-CM | POA: Insufficient documentation

## 2021-10-09 DIAGNOSIS — R739 Hyperglycemia, unspecified: Secondary | ICD-10-CM

## 2021-10-09 DIAGNOSIS — E559 Vitamin D deficiency, unspecified: Secondary | ICD-10-CM | POA: Diagnosis not present

## 2021-10-09 DIAGNOSIS — J449 Chronic obstructive pulmonary disease, unspecified: Secondary | ICD-10-CM | POA: Diagnosis not present

## 2021-10-09 DIAGNOSIS — D126 Benign neoplasm of colon, unspecified: Secondary | ICD-10-CM | POA: Insufficient documentation

## 2021-10-09 DIAGNOSIS — Z23 Encounter for immunization: Secondary | ICD-10-CM

## 2021-10-09 DIAGNOSIS — M5431 Sciatica, right side: Secondary | ICD-10-CM | POA: Insufficient documentation

## 2021-10-09 DIAGNOSIS — R059 Cough, unspecified: Secondary | ICD-10-CM | POA: Diagnosis not present

## 2021-10-09 DIAGNOSIS — K036 Deposits [accretions] on teeth: Secondary | ICD-10-CM | POA: Insufficient documentation

## 2021-10-09 DIAGNOSIS — E039 Hypothyroidism, unspecified: Secondary | ICD-10-CM | POA: Insufficient documentation

## 2021-10-09 DIAGNOSIS — M545 Low back pain, unspecified: Secondary | ICD-10-CM | POA: Insufficient documentation

## 2021-10-09 LAB — URINALYSIS, ROUTINE W REFLEX MICROSCOPIC
Bilirubin Urine: NEGATIVE
Hgb urine dipstick: NEGATIVE
Ketones, ur: NEGATIVE
Leukocytes,Ua: NEGATIVE
Nitrite: NEGATIVE
RBC / HPF: NONE SEEN (ref 0–?)
Specific Gravity, Urine: 1.02 (ref 1.000–1.030)
Total Protein, Urine: NEGATIVE
Urine Glucose: >=1000 — AB
Urobilinogen, UA: 0.2 (ref 0.0–1.0)
pH: 5 (ref 5.0–8.0)

## 2021-10-09 LAB — BASIC METABOLIC PANEL
BUN: 27 mg/dL — ABNORMAL HIGH (ref 6–23)
CO2: 29 mEq/L (ref 19–32)
Calcium: 9.6 mg/dL (ref 8.4–10.5)
Chloride: 99 mEq/L (ref 96–112)
Creatinine, Ser: 1.45 mg/dL (ref 0.40–1.50)
GFR: 46.8 mL/min — ABNORMAL LOW (ref >60.00)
Glucose, Bld: 114 mg/dL — ABNORMAL HIGH (ref 70–99)
Potassium: 4.3 mEq/L (ref 3.5–5.1)
Sodium: 137 mEq/L (ref 135–145)

## 2021-10-09 LAB — CBC WITH DIFFERENTIAL/PLATELET
Basophils Absolute: 0.1 10*3/uL (ref 0.0–0.1)
Basophils Relative: 1 % (ref 0.0–3.0)
Eosinophils Absolute: 0.1 10*3/uL (ref 0.0–0.7)
Eosinophils Relative: 1.4 % (ref 0.0–5.0)
HCT: 43.8 % (ref 39.0–52.0)
Hemoglobin: 14.5 g/dL (ref 13.0–17.0)
Lymphocytes Relative: 21.9 % (ref 12.0–46.0)
Lymphs Abs: 1.8 10*3/uL (ref 0.7–4.0)
MCHC: 33 g/dL (ref 30.0–36.0)
MCV: 96.8 fl (ref 78.0–100.0)
Monocytes Absolute: 0.6 10*3/uL (ref 0.1–1.0)
Monocytes Relative: 7.7 % (ref 3.0–12.0)
Neutro Abs: 5.6 10*3/uL (ref 1.4–7.7)
Neutrophils Relative %: 68 % (ref 43.0–77.0)
Platelets: 174 10*3/uL (ref 150.0–400.0)
RBC: 4.52 Mil/uL (ref 4.22–5.81)
RDW: 14.1 % (ref 11.5–15.5)
WBC: 8.2 10*3/uL (ref 4.0–10.5)

## 2021-10-09 LAB — LIPID PANEL
Cholesterol: 182 mg/dL (ref 0–200)
HDL: 42.2 mg/dL (ref >39.00)
NonHDL: 140.24
Total CHOL/HDL Ratio: 4
Triglycerides: 209 mg/dL — ABNORMAL HIGH (ref 0.0–149.0)
VLDL: 41.8 mg/dL — ABNORMAL HIGH (ref 0.0–40.0)

## 2021-10-09 LAB — HEPATIC FUNCTION PANEL
ALT: 16 U/L (ref 0–53)
AST: 20 U/L (ref 0–37)
Albumin: 4.4 g/dL (ref 3.5–5.2)
Alkaline Phosphatase: 57 U/L (ref 39–117)
Bilirubin, Direct: 0.2 mg/dL (ref 0.0–0.3)
Total Bilirubin: 1 mg/dL (ref 0.2–1.2)
Total Protein: 7.2 g/dL (ref 6.0–8.3)

## 2021-10-09 LAB — LDL CHOLESTEROL, DIRECT: Direct LDL: 102 mg/dL

## 2021-10-09 LAB — VITAMIN D 25 HYDROXY (VIT D DEFICIENCY, FRACTURES): VITD: 51.54 ng/mL (ref 30.00–100.00)

## 2021-10-09 LAB — HEMOGLOBIN A1C: Hgb A1c MFr Bld: 6.1 % (ref 4.6–6.5)

## 2021-10-09 LAB — VITAMIN B12: Vitamin B-12: 636 pg/mL (ref 211–911)

## 2021-10-09 LAB — TSH: TSH: 2.95 u[IU]/mL (ref 0.35–5.50)

## 2021-10-09 LAB — PSA: PSA: 1.23 ng/mL (ref 0.10–4.00)

## 2021-10-09 MED ORDER — LEVOFLOXACIN 500 MG PO TABS: 500.0000 mg | ORAL_TABLET | Freq: Every day | ORAL | 0 refills | Status: AC

## 2021-10-09 MED ORDER — HYDROCODONE BIT-HOMATROP MBR 5-1.5 MG/5ML PO SOLN: 5.0000 mL | Freq: Four times a day (QID) | ORAL | 0 refills | Status: AC | PRN

## 2021-10-09 NOTE — Assessment & Plan Note (Signed)
Last vitamin D ?Lab Results  ?Component Value Date  ? VD25OH 43.60 10/02/2020  ? ?Stable, cont oral replacement ?

## 2021-10-09 NOTE — Patient Instructions (Addendum)
You had the Pneumovax pneumonia shot today ? ?Please take all new medication as prescribed  - the antibiotic, and cough medicine as needed ? ?Please continue all other medications as before, and refills have been done if requested. ? ?Please have the pharmacy call with any other refills you may need. ? ?Please continue your efforts at being more active, low cholesterol diet, and weight control. ? ?You are otherwise up to date with prevention measures today. ? ?Please keep your appointments with your specialists as you may have planned ? ?Please go to the XRAY Department in the first floor for the x-ray testing ? ?Please go to the LAB at the blood drawing area for the tests to be done ? ?You will be contacted by phone if any changes need to be made immediately.  Otherwise, you will receive a letter about your results with an explanation, but please check with MyChart first. ? ?Please remember to sign up for MyChart if you have not done so, as this will be important to you in the future with finding out test results, communicating by private email, and scheduling acute appointments online when needed. ? ?Please make an Appointment to return in 6 months, or sooner if needed ?

## 2021-10-09 NOTE — Progress Notes (Signed)
Patient ID: [name] [name], male   DOB: 1946-05-10, 76 y.o.   MRN: [id number]         Chief Complaint:: wellness exam and cough, low vit d, right sciatica, hld, hyperglycemia, htn       HPI:  [name] [name] is a 76 y.o. male here for wellness exam; for pneumovax today, declines covid booster, o/w up to date                        Also believes 8 close family members died in recent years of pancreatic ca, has seen GI - for colonoscopy and CT soon. 4 brothers died in the past 3 yrs alone.  A son has copd needs double lung transplant.  Incidentally today - Here with acute onset mild to mod 2-3 days ST, HA, general weakness and malaise, with prod cough greenish sputum also with trace blood and mild intermittent dizziness, but Pt denies chest pain, increased sob or doe, wheezing, orthopnea, PND, increased LE swelling, palpitations, or syncope.  Pt continues to have recurring LBP with radiation and paresthesias to RLE including foot that currently is resolved, and no bowel or bladder change, fever, wt loss,  worsening LE pain/weakness, gait change or falls.   Pt denies polydipsia, polyuria, or new focal neuro s/s.   Wt Readings from Last 3 Encounters:  10/09/21 206 lb (93.4 kg)  09/23/21 206 lb (93.4 kg)  05/08/21 221 lb 3.2 oz (100.3 kg)   BP Readings from Last 3 Encounters:  10/09/21 120/68  09/23/21 124/70  05/08/21 120/76   Immunization History  Administered Date(s) Administered   Fluad Quad(high Dose 65+) 05/24/2019, 07/07/2021   Influenza Split 05/10/2012, 05/07/2015   Influenza Whole 05/10/2008   Influenza, High Dose Seasonal PF 06/09/2010, 08/07/2011, 05/27/2016, 06/22/2017, 06/15/2018, 04/12/2020   Influenza,inj,Quad PF,6+ Mos 05/11/2014   Influenza,inj,quad, With Preservative 05/25/2017   Influenza-Unspecified 05/11/2007, 05/24/2008, 05/31/2009, 05/11/2012, 05/18/2013, 05/10/2014, 05/05/2015, 05/10/2017, 07/11/2019, 04/12/2020   Moderna Sars-Covid-2 Vaccination 10/17/2019,  11/14/2019, 08/01/2020   Pneumococcal Conjugate-13 12/07/2014, 05/14/2015   Pneumococcal Polysaccharide-23 10/31/2007, 12/02/2016, 10/09/2021   Pneumococcal-Unspecified 05/10/2006, 06/09/2010   Td 10/29/2008   Tdap 03/22/2019   Zoster Recombinat (Shingrix) 08/10/2017, 10/08/2017, 11/10/2017, 01/11/2018   There are no preventive care reminders to display for this patient.     Past Medical History:  Diagnosis Date   ALLERGIC RHINITIS 10/31/2007   Qualifier: Diagnosis of  By: Jenny Reichmann MD, Hunt Oris    Allergy    CHOLECYSTECTOMY, HX OF 06/04/2007   Qualifier: Diagnosis of  By: Danny Lawless [place], [place]     COLONIC POLYPS, HX OF 10/31/2007   Qualifier: Diagnosis of  By: Jenny Reichmann MD, [place] ARTERY BYPASS GRAFT, HX OF 06/04/2007   Qualifier: Diagnosis of  By: [place], [place]     CORONARY ARTERY DISEASE 06/04/2007   Qualifier: Diagnosis of  By: Danny Lawless CMA, [place]     Diverticulosis    Erectile dysfunction    Eye twitch    right eye since chilhood    HYPERLIPIDEMIA 06/04/2007   Qualifier: Diagnosis of  By: [place], [place]     Hypersomnolence    HYPERTENSION 06/04/2007   Qualifier: Diagnosis of  By: [place], [place]     Hypothyroidism    ISCHEMIC CARDIOMYOPATHY 06/04/2007   Qualifier: Diagnosis of  By: Danny Lawless CMA, [place]     Myocardial infarction ([place])    per patient , his occurred in [place]  PLMD (periodic limb movement disorder)    Sleep apnea    no cpap' per patient , "i was checked for it and they said i didnt have it "    Vertigo    Vitamin D deficiency    Past Surgical History:  Procedure Laterality Date   CHOLECYSTECTOMY     COLONOSCOPY     CORONARY ARTERY BYPASS GRAFT     ICD IMPLANT N/A 01/30/2020   Procedure: ICD IMPLANT;  Surgeon: Thompson Grayer, MD;  Location: [facility];  Service: Cardiovascular;  Laterality: N/A;   KNEE ARTHROPLASTY Left 08/05/2017   Procedure: LEFT TOTAL KNEE ARTHROPLASTY WITH COMPUTER NAVIGATION;  Surgeon:  Rod Can, MD;  Location: [facility];  Service: Orthopedics;  Laterality: Left;  Needs RNFA   PENILE PROSTHESIS IMPLANT     REPLACEMENT TOTAL KNEE  06/12/2013   spinal cyst     THORACOTOMY     left anterior; wound exploration and debridement   TONSILLECTOMY AND ADENOIDECTOMY     age 18    reports that he quit smoking about 40 years ago. His smoking use included cigarettes. He has a 68.00 pack-year smoking history. He has never used smokeless tobacco. He reports current alcohol use. He reports that he does not use drugs. family history includes COPD in his son; Colon cancer in his maternal grandfather; Heart disease in his brother; Lung cancer in his father; Pancreatic cancer in his brother, cousin, and maternal uncle; Stomach cancer in his mother. Allergies  Allergen Reactions   Ace Inhibitors Cough   Rosuvastatin Other (See Comments)    Muscle pains   Statins Other (See Comments)    Muscle pains   Current Outpatient Medications on File Prior to Visit  Medication Sig Dispense Refill   apixaban (ELIQUIS) 2.5 MG TABS tablet Take by mouth.     Ascorbic Acid (VITA-C PO) Take by mouth. 1000mg  daily     b complex vitamins capsule Take 1 capsule by mouth daily.     empagliflozin (JARDIANCE) 10 MG TABS tablet Take 1 tablet (10 mg total) by mouth daily before breakfast. 30 tablet 11   ezetimibe (ZETIA) 10 MG tablet TAKE 1 TABLET EVERY DAY 90 tablet 3   meloxicam (MOBIC) 15 MG tablet Take 15 mg by mouth daily.      metoprolol succinate (TOPROL-XL) 50 MG 24 hr tablet TAKE 1 TABLET DAILY. TAKE WITH OR IMMEDIATELY FOLLOWING A MEAL. 90 tablet 1   Multiple Vitamins-Minerals (MULTIVITAMIN WITH MINERALS) tablet Take 1 tablet by mouth daily. Centrum silver     rosuvastatin (CRESTOR) 5 MG tablet TAKE 1 TABLET EVERY OTHER DAY 45 tablet 10   triamcinolone (NASACORT) 55 MCG/ACT AERO nasal inhaler Place 2 sprays into the nose daily. 3 Inhaler 3   valsartan (DIOVAN) 320 MG tablet TAKE 1 TABLET DAILY. 90  tablet 3   VITAMIN D PO Take by mouth. 125mg  daily     warfarin (COUMADIN) 5 MG tablet TAKE 1 TABLET EVERY DAY AT 4 PM 90 tablet 4   spironolactone (ALDACTONE) 25 MG tablet Take 0.5 tablets (12.5 mg total) by mouth daily. 45 tablet 3   No current facility-administered medications on file prior to visit.        ROS:  All others reviewed and negative.  Objective        PE:  BP 120/68 (BP Location: Left Arm, Patient Position: Sitting, Cuff Size: Large)    Pulse 60    Temp 98.3 F (36.8 C) (Oral)  Ht 5\' 11"  (1.803 m)    Wt 206 lb (93.4 kg)    SpO2 96%    BMI 28.73 kg/m                 Constitutional: Pt appears in NAD, mild ill appaering               HENT: Head: NCAT.                Right Ear: External ear normal.                 Left Ear: External ear normal.  Bilat tm's with mild erythema.  Max sinus areas non tender.  Pharynx with mild erythema, no exudate                Eyes: . Pupils are equal, round, and reactive to light. Conjunctivae and EOM are normal               Nose: without d/c or deformity               Neck: Neck supple. Gross normal ROM               Cardiovascular: Normal rate and regular rhythm.                 Pulmonary/Chest: Effort normal and breath sounds without rales or wheezing.                Abd:  Soft, NT, ND, + BS, no organomegaly               Neurological: Pt is alert. At baseline orientation, motor grossly intact               Skin: Skin is warm. No rashes, no other new lesions, LE edema - none               Psychiatric: Pt behavior is normal without agitation   Micro: none  Cardiac tracings I have personally interpreted today:  none  Pertinent Radiological findings (summarize): none   Lab Results  Component Value Date   WBC 8.2 10/09/2021   HGB 14.5 10/09/2021   HCT 43.8 10/09/2021   PLT 174.0 10/09/2021   GLUCOSE 114 (H) 10/09/2021   CHOL 182 10/09/2021   TRIG 209.0 (H) 10/09/2021   HDL 42.20 10/09/2021   LDLDIRECT 102.0 10/09/2021    LDLCALC 92 05/21/2021   ALT 16 10/09/2021   AST 20 10/09/2021   NA 137 10/09/2021   K 4.3 10/09/2021   CL 99 10/09/2021   CREATININE 1.45 10/09/2021   BUN 27 (H) 10/09/2021   CO2 29 10/09/2021   TSH 2.95 10/09/2021   PSA 1.23 10/09/2021   INR 1.9 (A) 10/14/2021   HGBA1C 6.1 10/09/2021   Assessment/Plan:  [name] [name] is a 76 y.o. White or Caucasian [1] male with  has a past medical history of ALLERGIC RHINITIS (10/31/2007), Allergy, CHOLECYSTECTOMY, HX OF (06/04/2007), COLONIC POLYPS, HX OF (10/31/2007), CORONARY ARTERY BYPASS GRAFT, HX OF (06/04/2007), CORONARY ARTERY DISEASE (06/04/2007), Diverticulosis, Erectile dysfunction, Eye twitch, HYPERLIPIDEMIA (06/04/2007), Hypersomnolence, HYPERTENSION (06/04/2007), Hypothyroidism, ISCHEMIC CARDIOMYOPATHY (06/04/2007), Myocardial infarction ([place]), Osteoarthritis, PLMD (periodic limb movement disorder), Sleep apnea, Vertigo, and Vitamin D deficiency.  Vitamin D deficiency Last vitamin D Lab Results  Component Value Date   VD25OH 43.60 10/02/2020   Stable, cont oral replacement  Encounter for well adult exam with abnormal findings Age and sex appropriate education and counseling updated with  regular exercise and diet Referrals for preventative services - none needed Immunizations addressed - for pneumovax today, declines covid booster Smoking counseling  - none needed Evidence for depression or other mood disorder - none significant Most recent labs reviewed. I have personally reviewed and have noted: 1) the patient's medical and social history 2) The patient's current medications and supplements 3) The patient's height, weight, and BMI have been recorded in the chart   Statin intolerance Pt continues to decline retrial statin  Right sided sciatica Mild intermittent, currently resolved, cont to follow,   Hyperlipidemia Lab Results  Component Value Date   LDLCALC 92 05/21/2021   Mild uncontrlled, pt to continue current  zetia as delcines statin  Hyperglycemia Lab Results  Component Value Date   HGBA1C 6.1 10/09/2021   Stable, pt to continue current medical treatment jardiance   Essential hypertension BP Readings from Last 3 Encounters:  10/09/21 120/68  09/23/21 124/70  05/08/21 120/76   Stable, pt to continue medical treatment toprol, diovan   Dizziness Also encourage to continue hydration  Cough with hemoptysis Likely bronchitis vs pna, for cxr, and empiric antibx, cough med prn  Followup: Return in about 6 months (around 04/11/2022).  Cathlean Cower, MD 10/15/2021 10:12 PM [facility]

## 2021-10-13 ENCOUNTER — Telehealth: Payer: Self-pay | Admitting: Internal Medicine

## 2021-10-13 NOTE — Telephone Encounter (Signed)
Spoke with patient in reagrds to scheduling AWV. ? ?While on the phone patient stated he could not take the Levofloxacin that was prescribed to him for the infection. He stated it made him feel awful- hot flashes and like he was going to pass out. He took the medication as prescribed Friday and Saturday, and discontinued it after Saturdays dose. Patient wanted me to let you know this. ?

## 2021-10-13 NOTE — Telephone Encounter (Signed)
Ok to hold on further tx for now, and pt to call for any worsening fever, cough, sob or other unusual symptoms ?

## 2021-10-14 ENCOUNTER — Ambulatory Visit (INDEPENDENT_AMBULATORY_CARE_PROVIDER_SITE_OTHER): Payer: Non-veteran care

## 2021-10-14 ENCOUNTER — Ambulatory Visit (INDEPENDENT_AMBULATORY_CARE_PROVIDER_SITE_OTHER): Payer: Medicare HMO | Admitting: *Deleted

## 2021-10-14 DIAGNOSIS — I5042 Chronic combined systolic (congestive) and diastolic (congestive) heart failure: Secondary | ICD-10-CM

## 2021-10-14 DIAGNOSIS — Z95810 Presence of automatic (implantable) cardiac defibrillator: Secondary | ICD-10-CM

## 2021-10-14 DIAGNOSIS — Z5181 Encounter for therapeutic drug level monitoring: Secondary | ICD-10-CM

## 2021-10-14 DIAGNOSIS — I240 Acute coronary thrombosis not resulting in myocardial infarction: Secondary | ICD-10-CM

## 2021-10-14 LAB — POCT INR: INR: 1.9 — AB (ref 2.0–3.0)

## 2021-10-14 NOTE — Telephone Encounter (Signed)
Patient notified and verbalizes understanding.

## 2021-10-14 NOTE — Progress Notes (Signed)
EPIC Encounter for ICM Monitoring ? ?Patient Name: [name] [name] is a 76 y.o. male ?Date: 10/14/2021 ?Primary Care Physican: Biagio Borg, MD ?Primary Cardiologist: [name] ?Electrophysiologist: Allred ?07/16/2021 Weight: 220 lbs ?08/22/2021 Weight: 206 lbs ?09/10/2021 Weight:  206 lbs ?10/07/2021 Weight: 206 lbs ?                                                         ?  ?Spoke with patient and heart failure questions reviewed.  He denies any fluid symptoms. He does not typically follow a low salt diet and uses salt shaker at table.   ?   ?Optivol thoracic impedance suggesting fluid levels returned to baseline on 3/1 & 3/6.    Fluid index > normal threshold starting 2/22. ?  ?Prescribed: ?Furosemide 20 mg take 1 tablet daily   ?Jardiance 10 mg take 1 tablet before breakfast ?  ?Labs: ?05/21/2021 Creatinine 1.36, BUN 29, Potassium 4.9, Sodium 136, GFR 56  ?01/24/2021 Creatinine 1.32, BUN 29, Potassium 4.9, Sodium 137, GFR 54  ?A complete set of results can be found in Results Review. ?  ?Recommendations:  Discussed limiting salt and avoid adding salt to foods.  Encouraged to call if any fluid symptoms.   ?  ?Follow-up plan: ICM clinic phone appointment on 11/10/2021.   91 day device clinic remote transmission 10/28/2021.   ?  ?EP/Cardiology Office Visits: 11/06/2021 with Dr [name].  Recall 11/10/2020 with Oda Kilts, PA. ?  ?Copy of ICM check sent to Dr. Rayann Heman.  ? ?3 month ICM trend: 10/14/2021. ? ? ? ?12-14 Month ICM trend:  ? ? ? ?Rosalene Billings, RN ?10/14/2021 ?2:59 PM ? ?

## 2021-10-14 NOTE — Patient Instructions (Signed)
Pending colonoscopy.  Not scheduled yet   ?Take warfarin 1 1/2 tablets today then resume 1 tablet daily except 1/2 tablet on Fridays ?Recheck in 4 wks ?

## 2021-10-15 ENCOUNTER — Encounter: Payer: Self-pay | Admitting: Internal Medicine

## 2021-10-15 NOTE — Assessment & Plan Note (Signed)
Age and sex appropriate education and counseling updated with regular exercise and diet ?Referrals for preventative services - none needed ?Immunizations addressed - for pneumovax today, declines covid booster ?Smoking counseling  - none needed ?Evidence for depression or other mood disorder - none significant ?Most recent labs reviewed. ?I have personally reviewed and have noted: ?1) the patient's medical and social history ?2) The patient's current medications and supplements ?3) The patient's height, weight, and BMI have been recorded in the chart ? ?

## 2021-10-15 NOTE — Assessment & Plan Note (Signed)
Lab Results  ?Component Value Date  ? [place] 92 05/21/2021  ? ?Mild uncontrlled, pt to continue current zetia as delcines statin ?

## 2021-10-15 NOTE — Assessment & Plan Note (Signed)
Mild intermittent, currently resolved, cont to follow,  ?

## 2021-10-15 NOTE — Assessment & Plan Note (Signed)
Likely bronchitis vs pna, for cxr, and empiric antibx, cough med prn ?

## 2021-10-15 NOTE — Assessment & Plan Note (Signed)
BP Readings from Last 3 Encounters:  ?10/09/21 120/68  ?09/23/21 124/70  ?05/08/21 120/76  ? ?Stable, pt to continue medical treatment toprol, diovan ? ?

## 2021-10-15 NOTE — Assessment & Plan Note (Signed)
Lab Results  ?Component Value Date  ? HGBA1C 6.1 10/09/2021  ? ?Stable, pt to continue current medical treatment jardiance ? ?

## 2021-10-15 NOTE — Assessment & Plan Note (Signed)
Also encourage to continue hydration ?

## 2021-10-15 NOTE — Assessment & Plan Note (Signed)
Pt continues to decline retrial statin ?

## 2021-10-22 ENCOUNTER — Ambulatory Visit: Payer: Non-veteran care

## 2021-10-24 ENCOUNTER — Other Ambulatory Visit (HOSPITAL_COMMUNITY)
Admission: RE | Admit: 2021-10-24 | Discharge: 2021-10-24 | Disposition: A | Discharge status: Home / Self Care | Payer: Medicare HMO | Source: Ambulatory Visit | Attending: Gastroenterology | Admitting: Gastroenterology

## 2021-10-24 DIAGNOSIS — R634 Abnormal weight loss: Secondary | ICD-10-CM | POA: Insufficient documentation

## 2021-10-24 LAB — BASIC METABOLIC PANEL
Anion gap: 7 (ref 5–15)
BUN: 30 mg/dL — ABNORMAL HIGH (ref 8–23)
CO2: 28 mmol/L (ref 22–32)
Calcium: 9.1 mg/dL (ref 8.9–10.3)
Chloride: 105 mmol/L (ref 98–111)
Creatinine, Ser: 1.41 mg/dL — ABNORMAL HIGH (ref 0.61–1.24)
GFR, Estimated: 52 mL/min — ABNORMAL LOW (ref >60)
Glucose, Bld: 115 mg/dL — ABNORMAL HIGH (ref 70–99)
Potassium: 4.9 mmol/L (ref 3.5–5.1)
Sodium: 140 mmol/L (ref 135–145)

## 2021-10-24 NOTE — Progress Notes (Signed)
? ? ? ? ?HPI: FU CAD; history dates back to 1998 when he had a myocardial infarction. He subsequently underwent minimally invasive LIMA to his LAD. Cardiac MRI July 2020 showed ejection fraction approximately 30% and apical thrombus noted. Mild aortic and mitral regurgitation. Had ICD placed June 2021.  Most recent echocardiogram May 2022 showed ejection fraction 30 to 35%, mild mitral regurgitation, mild aortic stenosis, mild aortic insufficiency.  Nuclear study June 2022 showed ejection fraction 29% and infarct but no ischemia.  Since I last saw him, patient denies dyspnea, chest pain or palpitations.  He has had some dizziness with standing.  2 months ago he had a syncopal episode after standing suddenly. ? ?Current Outpatient Medications  ?Medication Sig Dispense Refill  ? apixaban (ELIQUIS) 2.5 MG TABS tablet Take by mouth.    ? Ascorbic Acid (VITA-C PO) Take by mouth. '1000mg'$  daily    ? b complex vitamins capsule Take 1 capsule by mouth daily.    ? empagliflozin (JARDIANCE) 10 MG TABS tablet Take 1 tablet (10 mg total) by mouth daily before breakfast. 30 tablet 11  ? ezetimibe (ZETIA) 10 MG tablet TAKE 1 TABLET EVERY DAY 90 tablet 3  ? meloxicam (MOBIC) 15 MG tablet Take 15 mg by mouth daily.     ? metoprolol succinate (TOPROL-XL) 50 MG 24 hr tablet TAKE 1 TABLET DAILY. TAKE WITH OR IMMEDIATELY FOLLOWING A MEAL. 90 tablet 1  ? Multiple Vitamins-Minerals (MULTIVITAMIN WITH MINERALS) tablet Take 1 tablet by mouth daily. Centrum silver    ? rosuvastatin (CRESTOR) 5 MG tablet TAKE 1 TABLET EVERY OTHER DAY 45 tablet 10  ? triamcinolone (NASACORT) 55 MCG/ACT AERO nasal inhaler Place 2 sprays into the nose daily. 3 Inhaler 3  ? valsartan (DIOVAN) 320 MG tablet TAKE 1 TABLET DAILY. 90 tablet 3  ? VITAMIN D PO Take by mouth. '125mg'$  daily    ? warfarin (COUMADIN) 5 MG tablet TAKE 1 TABLET EVERY DAY AT 4 PM 90 tablet 4  ? spironolactone (ALDACTONE) 25 MG tablet Take 0.5 tablets (12.5 mg total) by mouth daily. 45 tablet 3   ? ?No current facility-administered medications for this visit.  ? ? ? ?Past Medical History:  ?Diagnosis Date  ? ALLERGIC RHINITIS 10/31/2007  ? Qualifier: Diagnosis of  By: Jenny Reichmann MD, [place] Oris   ? Allergy   ? CHOLECYSTECTOMY, HX OF 06/04/2007  ? Qualifier: Diagnosis of  By: [place], [place]    ? COLONIC POLYPS, HX OF 10/31/2007  ? Qualifier: Diagnosis of  By: Jenny Reichmann MD, Hunt Oris   ? CORONARY ARTERY BYPASS GRAFT, HX OF 06/04/2007  ? Qualifier: Diagnosis of  By: [place], [place]    ? CORONARY ARTERY DISEASE 06/04/2007  ? Qualifier: Diagnosis of  By: [place], [place]    ? Diverticulosis   ? Erectile dysfunction   ? Eye twitch   ? right eye since chilhood   ? HYPERLIPIDEMIA 06/04/2007  ? Qualifier: Diagnosis of  By: [place], [place]    ? Hypersomnolence   ? HYPERTENSION 06/04/2007  ? Qualifier: Diagnosis of  By: [place], [place]    ? Hypothyroidism   ? ISCHEMIC CARDIOMYOPATHY 06/04/2007  ? Qualifier: Diagnosis of  By: [place], [place]    ? Myocardial infarction [facility])   ? per patient , his occurred in 1998   ? Osteoarthritis   ? PLMD (periodic limb movement disorder)   ? Sleep apnea   ? no cpap' per patient , "i was checked for it  and they said i didnt have it "   ? Vertigo   ? Vitamin D deficiency   ? ? ?Past Surgical History:  ?Procedure Laterality Date  ? CHOLECYSTECTOMY    ? COLONOSCOPY    ? CORONARY ARTERY BYPASS GRAFT    ? ICD IMPLANT N/A 01/30/2020  ? Procedure: ICD IMPLANT;  Surgeon: Thompson Grayer, MD;  Location: [facility];  Service: Cardiovascular;  Laterality: N/A;  ? KNEE ARTHROPLASTY Left 08/05/2017  ? Procedure: LEFT TOTAL KNEE ARTHROPLASTY WITH COMPUTER NAVIGATION;  Surgeon: Rod Can, MD;  Location: [facility];  Service: Orthopedics;  Laterality: Left;  Needs RNFA  ? PENILE PROSTHESIS IMPLANT    ? REPLACEMENT TOTAL KNEE  06/12/2013  ? spinal cyst    ? THORACOTOMY    ? left anterior; wound exploration and debridement  ? TONSILLECTOMY AND ADENOIDECTOMY    ? age 32  ? ? ?Social History   ? ?Socioeconomic History  ? Marital status: Married  ?  Spouse name: [name]  ? Number of children: 3  ? Years of education: Not on file  ? Highest education level: Not on file  ?Occupational History  ? Occupation: truck Geophysicist/field seismologist  ?  Employer: [employer]  ?Tobacco Use  ? Smoking status: Former  ?  Packs/day: 4.00  ?  Years: 17.00  ?  Pack years: 68.00  ?  Types: Cigarettes  ?  Quit date: 05/07/1981  ?  Years since quitting: 40.5  ? Smokeless tobacco: Never  ? Tobacco comments:  ?  Pt states that he would let most of them "burn" pt states that he used anywhere between 4-5PPD  ?Vaping Use  ? Vaping Use: Never used  ?Substance and Sexual Activity  ? Alcohol use: Yes  ?  Alcohol/week: 0.0 standard drinks  ?  Comment: rare  ? Drug use: No  ? Sexual activity: Not on file  ?Other Topics Concern  ? Not on file  ?Social History Narrative  ? Lives in [place] with spouse  ? Retired Administrator  ? ?Social Determinants of Health  ? ?Financial Resource Strain: Low Risk   ? Difficulty of Paying Living Expenses: Not hard at all  ?Food Insecurity: No Food Insecurity  ? Worried About Charity fundraiser in the Last Year: Never true  ? Ran Out of Food in the Last Year: Never true  ?Transportation Needs: No Transportation Needs  ? Lack of Transportation (Medical): No  ? Lack of Transportation (Non-Medical): No  ?Physical Activity: Inactive  ? Days of Exercise per Week: 0 days  ? Minutes of Exercise per Session: 0 min  ?Stress: No Stress Concern Present  ? Feeling of Stress : Not at all  ?Social Connections: Moderately Isolated  ? Frequency of Communication with Friends and Family: More than three times a week  ? Frequency of Social Gatherings with Friends and Family: More than three times a week  ? Attends Religious Services: Never  ? Active Member of Clubs or Organizations: No  ? Attends Archivist Meetings: Never  ? Marital Status: Married  ?Intimate Partner Violence: Not At Risk  ? Fear of Current or Ex-Partner: No   ? Emotionally Abused: No  ? Physically Abused: No  ? Sexually Abused: No  ? ? ?Family History  ?Problem Relation Age of Onset  ? Stomach cancer Mother   ?     smokes  ? Lung cancer Father   ?     chewed tobacco  ?  Heart disease Brother   ?     first MI at 24yo, now 62 for transplant list/ ICM  ? Pancreatic cancer Brother   ? Colon cancer Maternal Grandfather   ? COPD Son   ?     was a smoker  ? Pancreatic cancer Maternal Uncle   ? Pancreatic cancer Cousin   ?     mat side x 4  ? Esophageal cancer Neg Hx   ? Prostate cancer Neg Hx   ? Rectal cancer Neg Hx   ? ? ?ROS: no fevers or chills, productive cough, hemoptysis, dysphasia, odynophagia, melena, hematochezia, dysuria, hematuria, rash, seizure activity, orthopnea, PND, pedal edema, claudication. Remaining systems are negative. ? ?Physical Exam: ?Well-developed well-nourished in no acute distress.  ?Skin is warm and dry.  ?HEENT is normal.  ?Neck is supple.  ?Chest is clear to auscultation with normal expansion.  ?Cardiovascular exam is regular rate and rhythm.  ?Abdominal exam nontender or distended. No masses palpated. ?Extremities show no edema. ?neuro grossly intact ? ?A/P ? ?1 coronary artery disease-patient denies chest pain.  Continue statin.  He is not on aspirin given need for Coumadin. ? ?2 ischemic cardiomyopathy/CHF-continue ARB and beta-blocker.  He previously did not tolerate Entresto.  We will continue Jardiance.  Continue diuretics at present dose. ? ?3 hypertension-blood pressure controlled today.  However he is having orthostatic symptoms.  I will decrease valsartan to 160 mg daily to allow his blood pressure to run higher.  We will follow and make further adjustments if needed.  Check potassium and renal function in 1 week. ? ?4 hyperlipidemia-continue statin.  Note he did not tolerate higher doses in the past. ? ?5 history of apical thrombus-continue Coumadin with goal INR 2-3. ? ?6 ICD-Per EP. ? ?Kirk Ruths, MD ? ? ? ?

## 2021-10-27 ENCOUNTER — Ambulatory Visit (INDEPENDENT_AMBULATORY_CARE_PROVIDER_SITE_OTHER): Payer: No Typology Code available for payment source

## 2021-10-27 ENCOUNTER — Other Ambulatory Visit: Payer: Self-pay

## 2021-10-27 DIAGNOSIS — Z0000 Encounter for general adult medical examination without abnormal findings: Secondary | ICD-10-CM

## 2021-10-27 NOTE — Patient Instructions (Signed)
[name] [name] , ?Thank you for taking time to come for your Medicare Wellness Visit. I appreciate your ongoing commitment to your health goals. Please review the following plan we discussed and let me know if I can assist you in the future.  ? ?Screening recommendations/referrals: ?Colonoscopy: scheduled with [facility] ?Recommended yearly ophthalmology/optometry visit for glaucoma screening and checkup ?Recommended yearly dental visit for hygiene and checkup ? ?Vaccinations: ?Influenza vaccine: 07/07/2021 ?Pneumococcal vaccine: 05/14/2015, 01/11/2018 ?Tdap vaccine: 03/22/2019 ?Shingles vaccine: 01/11/2018, 11/10/2017 ?Covid-19: 10/17/2019, 11/14/2019, 08/01/2020 ? ?Advanced directives: yes but need copy  ? ?Conditions/risks identified: Yes; Client understands the importance of follow-up appointments with providers by attending scheduled visits and discussed goals to eat healthier, increase physical activity 5 times a week for 30 minutes each, exercise the brain by doing stimulating brain exercises (reading, adult coloring, crafting, listening to music, puzzles, etc.), socialize and enjoy life more, get enough sleep at least 8-9 hours average per night and make time for laughter. ? ?Next appointment: please schedule your next Annual Wellness Visit by calling our office at [phone] ? ?Preventive Care 3 Years and Older, Male ?Preventive care refers to lifestyle choices and visits with your health care provider that can promote health and wellness. ?What does preventive care include? ?A yearly physical exam. This is also called an annual well check. ?Dental exams once or twice a year. ?Routine eye exams. Ask your health care provider how often you should have your eyes checked. ?Personal lifestyle choices, including: ?Daily care of your teeth and gums. ?Regular physical activity. ?Eating a healthy diet. ?Avoiding tobacco and drug use. ?Limiting alcohol use. ?Practicing safe sex. ?Taking low doses of aspirin every day. ?Taking  vitamin and mineral supplements as recommended by your health care provider. ?What happens during an annual well check? ?The services and screenings done by your health care provider during your annual well check will depend on your age, overall health, lifestyle risk factors, and family history of disease. ?Counseling  ?Your health care provider may ask you questions about your: ?Alcohol use. ?Tobacco use. ?Drug use. ?Emotional well-being. ?Home and relationship well-being. ?Sexual activity. ?Eating habits. ?History of falls. ?Memory and ability to understand (cognition). ?Work and work Statistician. ?Screening  ?You may have the following tests or measurements: ?Height, weight, and BMI. ?Blood pressure. ?Lipid and cholesterol levels. These may be checked every 5 years, or more frequently if you are over 61 years old. ?Skin check. ?Lung cancer screening. You may have this screening every year starting at age 25 if you have a 30-pack-year history of smoking and currently smoke or have quit within the past 15 years. ?Fecal occult blood test (FOBT) of the stool. You may have this test every year starting at age 51. ?Flexible sigmoidoscopy or colonoscopy. You may have a sigmoidoscopy every 5 years or a colonoscopy every 10 years starting at age 12. ?Prostate cancer screening. Recommendations will vary depending on your family history and other risks. ?Hepatitis C blood test. ?Hepatitis B blood test. ?Sexually transmitted disease (STD) testing. ?Diabetes screening. This is done by checking your blood sugar (glucose) after you have not eaten for a while (fasting). You may have this done every 1-3 years. ?Abdominal aortic aneurysm (AAA) screening. You may need this if you are a current or former smoker. ?Osteoporosis. You may be screened starting at age 70 if you are at high risk. ?Talk with your health care provider about your test results, treatment options, and if necessary, the need for more tests. ?Vaccines  ?  Your  health care provider may recommend certain vaccines, such as: ?Influenza vaccine. This is recommended every year. ?Tetanus, diphtheria, and acellular pertussis (Tdap, Td) vaccine. You may need a Td booster every 10 years. ?Zoster vaccine. You may need this after age 64. ?Pneumococcal 13-valent conjugate (PCV13) vaccine. One dose is recommended after age 63. ?Pneumococcal polysaccharide (PPSV23) vaccine. One dose is recommended after age 56. ?Talk to your health care provider about which screenings and vaccines you need and how often you need them. ?This information is not intended to replace advice given to you by your health care provider. Make sure you discuss any questions you have with your health care provider. ?Document Released: 08/23/2015 Document Revised: 04/15/2016 Document Reviewed: 05/28/2015 ?Elsevier Interactive Patient Education ? 2017 [place]. ? ?Fall Prevention in the Home ?Falls can cause injuries. They can happen to people of all ages. There are many things you can do to make your home safe and to help prevent falls. ?What can I do on the outside of my home? ?Regularly fix the edges of walkways and driveways and fix any cracks. ?Remove anything that might make you trip as you walk through a door, such as a raised step or threshold. ?Trim any bushes or trees on the path to your home. ?Use bright outdoor lighting. ?Clear any walking paths of anything that might make someone trip, such as rocks or tools. ?Regularly check to see if handrails are loose or broken. Make sure that both sides of any steps have handrails. ?Any raised decks and porches should have guardrails on the edges. ?Have any leaves, snow, or ice cleared regularly. ?Use sand or salt on walking paths during winter. ?Clean up any spills in your garage right away. This includes oil or grease spills. ?What can I do in the bathroom? ?Use night lights. ?Install grab bars by the toilet and in the tub and shower. Do not use towel bars as  grab bars. ?Use non-skid mats or decals in the tub or shower. ?If you need to sit down in the shower, use a plastic, non-slip stool. ?Keep the floor dry. Clean up any water that spills on the floor as soon as it happens. ?Remove soap buildup in the tub or shower regularly. ?Attach bath mats securely with double-sided non-slip rug tape. ?Do not have throw rugs and other things on the floor that can make you trip. ?What can I do in the bedroom? ?Use night lights. ?Make sure that you have a light by your bed that is easy to reach. ?Do not use any sheets or blankets that are too big for your bed. They should not hang down onto the floor. ?Have a firm chair that has side arms. You can use this for support while you get dressed. ?Do not have throw rugs and other things on the floor that can make you trip. ?What can I do in the kitchen? ?Clean up any spills right away. ?Avoid walking on wet floors. ?Keep items that you use a lot in easy-to-reach places. ?If you need to reach something above you, use a strong step stool that has a grab bar. ?Keep electrical cords out of the way. ?Do not use floor polish or wax that makes floors slippery. If you must use wax, use non-skid floor wax. ?Do not have throw rugs and other things on the floor that can make you trip. ?What can I do with my stairs? ?Do not leave any items on the stairs. ?Make sure that there are  handrails on both sides of the stairs and use them. Fix handrails that are broken or loose. Make sure that handrails are as long as the stairways. ?Check any carpeting to make sure that it is firmly attached to the stairs. Fix any carpet that is loose or worn. ?Avoid having throw rugs at the top or bottom of the stairs. If you do have throw rugs, attach them to the floor with carpet tape. ?Make sure that you have a light switch at the top of the stairs and the bottom of the stairs. If you do not have them, ask someone to add them for you. ?What else can I do to help prevent  falls? ?Wear shoes that: ?Do not have high heels. ?Have rubber bottoms. ?Are comfortable and fit you well. ?Are closed at the toe. Do not wear sandals. ?If you use a stepladder: ?Make sure that it is ful

## 2021-10-27 NOTE — Progress Notes (Signed)
?I connected with [name] [name] today by telephone and verified that I am speaking with the correct person using two identifiers. ?Location patient: home ?Location provider: work ?Persons participating in the virtual visit: patient, provider. ?  ?I discussed the limitations, risks, security and privacy concerns of performing an evaluation and management service by telephone and the availability of in person appointments. I also discussed with the patient that there may be a patient responsible charge related to this service. The patient expressed understanding and verbally consented to this telephonic visit.  ?  ?Interactive audio and video telecommunications were attempted between this provider and patient, however failed, due to patient having technical difficulties OR patient did not have access to video capability.  We continued and completed visit with audio only. ? ?Some vital signs may be absent or patient reported.  ? ?Time Spent with patient on telephone encounter: 30 minutes ? ?Subjective:  ? [name] [name] is a 76 y.o. male who presents for Medicare Annual/Subsequent preventive examination. ? ?Review of Systems    ? ?Cardiac Risk Factors include: advanced age (>43mn, >>56women);male gender;family history of premature cardiovascular disease;hypertension;dyslipidemia ? ?   ?Objective:  ?  ?There were no vitals filed for this visit. ?There is no height or weight on file to calculate BMI. ? ?Advanced Directives 10/27/2021 01/30/2020 08/05/2017 07/30/2017  ?Does Patient Have a Medical Advance Directive? Yes Yes Yes Yes  ?Type of Advance Directive Living will;Healthcare Power of Attorney - Living will Living will  ?Does patient want to make changes to medical advance directive? No - Patient declined No - Patient declined No - Patient declined No - Patient declined  ?Copy of HClaytonin Chart? No - copy requested - - -  ? ? ?Current Medications (verified) ?Outpatient Encounter Medications  as of 10/27/2021  ?Medication Sig  ? Ascorbic Acid (VITA-C PO) Take by mouth. '1000mg'$  daily  ? b complex vitamins capsule Take 1 capsule by mouth daily.  ? empagliflozin (JARDIANCE) 10 MG TABS tablet Take 1 tablet (10 mg total) by mouth daily before breakfast.  ? ezetimibe (ZETIA) 10 MG tablet TAKE 1 TABLET EVERY DAY  ? meloxicam (MOBIC) 15 MG tablet Take 15 mg by mouth daily.   ? metoprolol succinate (TOPROL-XL) 50 MG 24 hr tablet TAKE 1 TABLET DAILY. TAKE WITH OR IMMEDIATELY FOLLOWING A MEAL.  ? Multiple Vitamins-Minerals (MULTIVITAMIN WITH MINERALS) tablet Take 1 tablet by mouth daily. Centrum silver  ? rosuvastatin (CRESTOR) 5 MG tablet TAKE 1 TABLET EVERY OTHER DAY  ? spironolactone (ALDACTONE) 25 MG tablet Take 0.5 tablets (12.5 mg total) by mouth daily.  ? triamcinolone (NASACORT) 55 MCG/ACT AERO nasal inhaler Place 2 sprays into the nose daily.  ? valsartan (DIOVAN) 320 MG tablet TAKE 1 TABLET DAILY.  ? VITAMIN D PO Take by mouth. '125mg'$  daily  ? warfarin (COUMADIN) 5 MG tablet TAKE 1 TABLET EVERY DAY AT 4 PM  ? apixaban (ELIQUIS) 2.5 MG TABS tablet Take by mouth. (Patient not taking: Reported on 10/27/2021)  ? ?No facility-administered encounter medications on file as of 10/27/2021.  ? ? ?Allergies (verified) ?Ace inhibitors, Rosuvastatin, and Statins  ? ?History: ?Past Medical History:  ?Diagnosis Date  ? ALLERGIC RHINITIS 10/31/2007  ? Qualifier: Diagnosis of  By: JJenny ReichmannMD, JHunt Oris  ? Allergy   ? CHOLECYSTECTOMY, HX OF 06/04/2007  ? Qualifier: Diagnosis of  By: SPreston K[place]und[place]   ? COLONIC POLYPS, HX OF 10/31/2007  ? Qualifier: Diagnosis of  By: Jenny Reichmann MD, Hunt Oris   ? CORONARY ARTERY BYPASS GRAFT, HX OF 06/04/2007  ? Qualifier: Diagnosis of  By: [place], [place]    ? CORONARY ARTERY DISEASE 06/04/2007  ? Qualifier: Diagnosis of  By: [place], [place]    ? Diverticulosis   ? Erectile dysfunction   ? Eye twitch   ? right eye since chilhood   ? HYPERLIPIDEMIA 06/04/2007  ? Qualifier: Diagnosis of  By:  [place], [place]    ? Hypersomnolence   ? HYPERTENSION 06/04/2007  ? Qualifier: Diagnosis of  By: [place], [place]    ? Hypothyroidism   ? ISCHEMIC CARDIOMYOPATHY 06/04/2007  ? Qualifier: Diagnosis of  By: [place], [place]    ? Myocardial infarction [facility])   ? per patient , his occurred in 1998   ? Osteoarthritis   ? PLMD (periodic limb movement disorder)   ? Sleep apnea   ? no cpap' per patient , "i was checked for it and they said i didnt have it "   ? Vertigo   ? Vitamin D deficiency   ? ?Past Surgical History:  ?Procedure Laterality Date  ? CHOLECYSTECTOMY    ? COLONOSCOPY    ? CORONARY ARTERY BYPASS GRAFT    ? ICD IMPLANT N/A 01/30/2020  ? Procedure: ICD IMPLANT;  Surgeon: Thompson Grayer, MD;  Location: [facility];  Service: Cardiovascular;  Laterality: N/A;  ? KNEE ARTHROPLASTY Left 08/05/2017  ? Procedure: LEFT TOTAL KNEE ARTHROPLASTY WITH COMPUTER NAVIGATION;  Surgeon: Rod Can, MD;  Location: [facility];  Service: Orthopedics;  Laterality: Left;  Needs RNFA  ? PENILE PROSTHESIS IMPLANT    ? REPLACEMENT TOTAL KNEE  06/12/2013  ? spinal cyst    ? THORACOTOMY    ? left anterior; wound exploration and debridement  ? TONSILLECTOMY AND ADENOIDECTOMY    ? age 56  ? ?Family History  ?Problem Relation Age of Onset  ? Stomach cancer Mother   ?     smokes  ? Lung cancer Father   ?     chewed tobacco  ? Heart disease Brother   ?     first MI at 65yo, now 42 for transplant list/ ICM  ? Pancreatic cancer Brother   ? Colon cancer Maternal Grandfather   ? COPD Son   ?     was a smoker  ? Pancreatic cancer Maternal Uncle   ? Pancreatic cancer Cousin   ?     mat side x 4  ? Esophageal cancer Neg Hx   ? Prostate cancer Neg Hx   ? Rectal cancer Neg Hx   ? ?Social History  ? ?Socioeconomic History  ? Marital status: Married  ?  Spouse name: [name] [name]  ? Number of children: 3  ? Years of education: Not on file  ? Highest education level: Not on file  ?Occupational History  ? Occupation: truck Geophysicist/field seismologist  ?  Employer:  [employer]  ?Tobacco Use  ? Smoking status: Former  ?  Packs/day: 4.00  ?  Years: 17.00  ?  Pack years: 68.00  ?  Types: Cigarettes  ?  Quit date: 05/07/1981  ?  Years since quitting: 40.5  ? Smokeless tobacco: Never  ? Tobacco comments:  ?  Pt states that he would let most of them "burn" pt states that he used anywhere between 4-5PPD  ?Vaping Use  ? Vaping Use: Never used  ?Substance and Sexual Activity  ? Alcohol use: Yes  ?  Alcohol/week: 0.0 standard drinks  ?  Comment: rare  ? Drug use: No  ? Sexual activity: Not on file  ?Other Topics Concern  ? Not on file  ?Social History Narrative  ? Lives in [place] with spouse  ? Retired Administrator  ? ?Social Determinants of Health  ? ?Financial Resource Strain: Low Risk   ? Difficulty of Paying Living Expenses: Not hard at all  ?Food Insecurity: No Food Insecurity  ? Worried About Charity fundraiser in the Last Year: Never true  ? Ran Out of Food in the Last Year: Never true  ?Transportation Needs: No Transportation Needs  ? Lack of Transportation (Medical): No  ? Lack of Transportation (Non-Medical): No  ?Physical Activity: Inactive  ? Days of Exercise per Week: 0 days  ? Minutes of Exercise per Session: 0 min  ?Stress: No Stress Concern Present  ? Feeling of Stress : Not at all  ?Social Connections: Moderately Isolated  ? Frequency of Communication with Friends and Family: More than three times a week  ? Frequency of Social Gatherings with Friends and Family: More than three times a week  ? Attends Religious Services: Never  ? Active Member of Clubs or Organizations: No  ? Attends Archivist Meetings: Never  ? Marital Status: Married  ? ? ?Tobacco Counseling ?Counseling given: Not Answered ?Tobacco comments: Pt states that he would let most of them "burn" pt states that he used anywhere between 4-5PPD ? ? ?Clinical Intake: ? ?Pre-visit preparation completed: No ? ?Pain : No/denies pain ? ?  ? ?Nutritional Risks: None ?Diabetes: No ? ?How often do  you need to have someone help you when you read instructions, pamphlets, or other written materials from your doctor or pharmacy?: 1 - Never ?What is the last grade level you completed in school?: 11th grade

## 2021-10-28 ENCOUNTER — Ambulatory Visit (INDEPENDENT_AMBULATORY_CARE_PROVIDER_SITE_OTHER): Payer: Medicare HMO

## 2021-10-28 DIAGNOSIS — I255 Ischemic cardiomyopathy: Secondary | ICD-10-CM

## 2021-10-29 LAB — CUP PACEART REMOTE DEVICE CHECK
Battery Remaining Longevity: 107 mo
Battery Voltage: 3.01 V
Brady Statistic AP VP Percent: 0.06 %
Brady Statistic AP VS Percent: 50.48 %
Brady Statistic AS VP Percent: 0.04 %
Brady Statistic AS VS Percent: 49.42 %
Brady Statistic RA Percent Paced: 46.31 %
Brady Statistic RV Percent Paced: 0.09 %
Date Time Interrogation Session: 20230321033424
HighPow Impedance: 67 Ohm
Implantable Lead Implant Date: 20210622
Implantable Lead Implant Date: 20210622
Implantable Lead Location: 753859
Implantable Lead Location: 753860
Implantable Lead Model: 5076
Implantable Pulse Generator Implant Date: 20210622
Lead Channel Impedance Value: 304 Ohm
Lead Channel Impedance Value: 399 Ohm
Lead Channel Impedance Value: 418 Ohm
Lead Channel Pacing Threshold Amplitude: 0.5 V
Lead Channel Pacing Threshold Amplitude: 0.625 V
Lead Channel Pacing Threshold Pulse Width: 0.4 ms
Lead Channel Pacing Threshold Pulse Width: 0.4 ms
Lead Channel Sensing Intrinsic Amplitude: 4.625 mV
Lead Channel Sensing Intrinsic Amplitude: 4.625 mV
Lead Channel Sensing Intrinsic Amplitude: 7.125 mV
Lead Channel Sensing Intrinsic Amplitude: 7.125 mV
Lead Channel Setting Pacing Amplitude: 1.5 V
Lead Channel Setting Pacing Amplitude: 2.5 V
Lead Channel Setting Pacing Pulse Width: 0.4 ms
Lead Channel Setting Sensing Sensitivity: 0.3 mV

## 2021-10-31 ENCOUNTER — Ambulatory Visit (HOSPITAL_COMMUNITY)
Admission: RE | Admit: 2021-10-31 | Discharge: 2021-10-31 | Disposition: A | Discharge status: Home / Self Care | Payer: No Typology Code available for payment source | Source: Ambulatory Visit | Attending: Gastroenterology | Admitting: Gastroenterology

## 2021-10-31 ENCOUNTER — Other Ambulatory Visit: Payer: Self-pay

## 2021-10-31 DIAGNOSIS — Z800 Family history of malignant neoplasm of digestive organs: Secondary | ICD-10-CM | POA: Diagnosis present

## 2021-10-31 DIAGNOSIS — R634 Abnormal weight loss: Secondary | ICD-10-CM

## 2021-10-31 MED ORDER — IOHEXOL 300 MG/ML  SOLN
100.0000 mL | Freq: Once | INTRAMUSCULAR | Status: AC | PRN
  Administered 2021-10-31: 100 mL via INTRAVENOUS

## 2021-11-06 ENCOUNTER — Ambulatory Visit: Payer: Medicare HMO | Admitting: Cardiology

## 2021-11-06 ENCOUNTER — Other Ambulatory Visit: Payer: Self-pay

## 2021-11-06 ENCOUNTER — Encounter: Payer: Self-pay | Admitting: *Deleted

## 2021-11-06 ENCOUNTER — Encounter: Payer: Self-pay | Admitting: Cardiology

## 2021-11-06 VITALS — BP 110/62 | HR 63 | Ht 71.0 in | Wt 208.8 lb

## 2021-11-06 DIAGNOSIS — I2510 Atherosclerotic heart disease of native coronary artery without angina pectoris: Secondary | ICD-10-CM | POA: Diagnosis not present

## 2021-11-06 DIAGNOSIS — I255 Ischemic cardiomyopathy: Secondary | ICD-10-CM

## 2021-11-06 DIAGNOSIS — R933 Abnormal findings on diagnostic imaging of other parts of digestive tract: Secondary | ICD-10-CM

## 2021-11-06 DIAGNOSIS — Z95810 Presence of automatic (implantable) cardiac defibrillator: Secondary | ICD-10-CM | POA: Diagnosis not present

## 2021-11-06 DIAGNOSIS — I10 Essential (primary) hypertension: Secondary | ICD-10-CM | POA: Diagnosis not present

## 2021-11-06 DIAGNOSIS — Z5181 Encounter for therapeutic drug level monitoring: Secondary | ICD-10-CM | POA: Diagnosis not present

## 2021-11-06 DIAGNOSIS — I5042 Chronic combined systolic (congestive) and diastolic (congestive) heart failure: Secondary | ICD-10-CM

## 2021-11-06 MED ORDER — VALSARTAN 160 MG PO TABS: 160.0000 mg | ORAL_TABLET | Freq: Every day | ORAL | 3 refills | Status: DC

## 2021-11-06 NOTE — Progress Notes (Unsigned)
[name], MD  Roosvelt Maser ?He is not on apixban; hold coumadin 4 days prior to procedure and resume day after\  ?Kirk Ruths   ?  ?   ?Previous Messages ?  ?----- Message -----  ?From: Roosvelt Maser  ?Sent: 11/06/2021   3:36 PM EDT  ?To: [name], MD, Loralie Champagne, PA-C  ?Subject: Bloodthinners                                  ? ? ? ?Dr. [name],  ? ?Dr. Myrtice Lauth from [facility] has ordered a Ct Biopsy for this patient and he will need to hold his Coumadin 4 days prior to the biopsy and also hold his Eliquis 48 hours prior to biopsy  ? ?Can he hold both of these bloodthinners?  ? ?Thank you,  ?Vivien Rota  ?

## 2021-11-06 NOTE — Patient Instructions (Signed)
Medication Instructions:  ? ?DECREASE VALSARTAN TO 160 MG ONCE DAILY=1/2 OF THE 320 MG TABLET ONCE DAILY ? ?*If you need a refill on your cardiac medications before your next appointment, please call your pharmacy* ? ? ?Lab Work: ? ?Your physician recommends that you return for lab work in: ONE WEEK-DO NOT NEED TO FAST ? ?If you have labs (blood work) drawn today and your tests are completely normal, you will receive your results only by: ?MyChart Message (if you have MyChart) OR ?A paper copy in the mail ?If you have any lab test that is abnormal or we need to change your treatment, we will call you to review the results. ? ? ?Follow-Up: ?At [facility], you and your health needs are our priority.  As part of our continuing mission to provide you with exceptional heart care, we have created designated Provider Care Teams.  These Care Teams include your primary Cardiologist (physician) and Advanced Practice Providers (APPs -  Physician Assistants and Nurse Practitioners) who all work together to provide you with the care you need, when you need it. ? ?We recommend signing up for the patient portal called "MyChart".  Sign up information is provided on this After Visit Summary.  MyChart is used to connect with patients for Virtual Visits (Telemedicine).  Patients are able to view lab/test results, encounter notes, upcoming appointments, etc.  Non-urgent messages can be sent to your provider as well.   ?To learn more about what you can do with MyChart, go to [email or web address].   ? ?Your next appointment:   ?3 month(s) ? ?The format for your next appointment:   ?In Person ? ?Provider:   ?Kirk Ruths, MD   ? ? ? ?

## 2021-11-06 NOTE — Progress Notes (Unsigned)
Suttle, Rosanne Ashing, MD  Roosvelt Maser ?Approved for CT guided right suprarenal soft tissue mass biopsy.  ? ?Dylan   ?  ?   ?Previous Messages ?  ?----- Message -----  ?From: Roosvelt Maser  ?Sent: 11/06/2021  12:21 PM EDT  ?To: Ir Procedure Requests  ?Subject: CT Biopsy                                      ? ? ? ?Ct Biopsy  ? ?abnormal thickening on the right side of abdomen in some area of muscle. May suggest a malignancy-needs biopsy  ? ?Ct done 11/03/21  ? ?Dr. Myrtice Lauth, Janett Billow  ?[phone]   ?

## 2021-11-07 ENCOUNTER — Telehealth: Payer: Self-pay

## 2021-11-07 ENCOUNTER — Other Ambulatory Visit: Payer: Self-pay

## 2021-11-07 DIAGNOSIS — R933 Abnormal findings on diagnostic imaging of other parts of digestive tract: Secondary | ICD-10-CM

## 2021-11-07 DIAGNOSIS — R634 Abnormal weight loss: Secondary | ICD-10-CM

## 2021-11-07 DIAGNOSIS — Z800 Family history of malignant neoplasm of digestive organs: Secondary | ICD-10-CM

## 2021-11-07 DIAGNOSIS — Z86010 Personal history of colonic polyps: Secondary | ICD-10-CM

## 2021-11-07 NOTE — Telephone Encounter (Signed)
 [facility] Pre-operative Risk Assessment  ?   ?Request for surgical clearance:     Endoscopy Procedure ? ?What type of surgery is being performed?     Endo colon  ? ?When is this surgery scheduled?     11/25/21 ? ?What type of clearance is required ?   Pharmacy ? ?Are there any medications that need to be held prior to surgery and how long? Coumadin ? ?Practice name and name of physician performing surgery?      [facility] ? ?What is your office phone and fax number?      Phone- [phone]  Fax- [phone] ? ?Anesthesia type (None, local, MAC, general) ?       [facility]  ?

## 2021-11-07 NOTE — Telephone Encounter (Signed)
The pt has been advised to stop coumadin 5 days prior to his procedure.  The pt has been advised of the information and verbalized understanding.    ?

## 2021-11-07 NOTE — Telephone Encounter (Signed)
Patient with diagnosis of apical thrombus on warfarin for anticoagulation.   ? ?Procedure: endoscopy/colonoscopy ?Date of procedure: 11/25/21 ? ?Per office protocol, patient can hold warfarin for 5 days prior to procedure.   ? ?Patient previously cleared in Oct 22 by Dr. [name] to hold WITHOUT a bridge. ? ?Therefore patient may hold warfarin for 5 days prior to procedure without a bridge. ? ?

## 2021-11-10 ENCOUNTER — Telehealth: Payer: Self-pay

## 2021-11-10 ENCOUNTER — Ambulatory Visit (INDEPENDENT_AMBULATORY_CARE_PROVIDER_SITE_OTHER): Payer: Medicare HMO

## 2021-11-10 DIAGNOSIS — I5042 Chronic combined systolic (congestive) and diastolic (congestive) heart failure: Secondary | ICD-10-CM

## 2021-11-10 DIAGNOSIS — Z95810 Presence of automatic (implantable) cardiac defibrillator: Secondary | ICD-10-CM

## 2021-11-10 NOTE — Progress Notes (Signed)
EPIC Encounter for ICM Monitoring ? ?Patient Name: [name] [name] is a 76 y.o. male ?Date: 11/10/2021 ?Primary Care Physican: Biagio Borg, MD ?Primary Cardiologist: [name] ?Electrophysiologist: Allred ?09/10/2021 Weight:   206 lbs ?10/07/2021 Weight: 206 lbs (does not weight at home) ?                                                         ?  ?Spoke with patient and heart failure questions reviewed.  Pt asymptomatic for fluid accumulation.  Reports feeling well at this time and voices no complaints.  He occasionally eats restaurant foods and uses salt shaker.  Is not strict with checking food labels with salt content. ?   ?Optivol thoracic impedance suggesting possible fluid accumulation since 3/9.    Fluid index > normal threshold starting 2/22. ?  ?Prescribed: ?Furosemide 20 mg take 1 tablet daily   ?Jardiance 10 mg take 1 tablet before breakfast ?  ?Labs: ?10/24/2021 Creatinine 1.41, BUN 30, Potassium 4.9, Sodium 140, GFR 52 ?10/09/2021 Creatinine 1.45, BUN 27, Potassium 4.3, Sodium 137 ?A complete set of results can be found in Results Review. ?  ?Recommendations:  Recommendation to limit salt intake to 2000 mg daily and fluid intake to 64 oz daily.  Encouraged to call if experiencing any fluid symptoms.    ?  ?Follow-up plan: ICM clinic phone appointment on 11/24/2021 to recheck fluid levels.   91 day device clinic remote transmission 01/27/2022.   ?  ?EP/Cardiology Office Visits: 11/06/2021 with Dr [name] was last OV.  Message sent to EP scheduler to call and make overdue appointment for EP OV.  Recall 11/10/2020 with Oda Kilts, PA. ?  ?Copy of ICM check sent to Dr. Rayann Heman and Dr [name] for review.   ? ?3 month ICM trend: 11/10/2021. ? ? ? ?12-14 Month ICM trend:  ? ? ? ?Rosalene Billings, RN ?11/10/2021 ?11:18 AM ? ?

## 2021-11-10 NOTE — Telephone Encounter (Signed)
The EGD/ Colon was previously scheduled by RN Patty; ?Pt made aware   ?

## 2021-11-10 NOTE — Telephone Encounter (Signed)
-----   Message from Jackquline Denmark, MD sent at 11/07/2021  2:12 PM EDT ----- ?Regarding: EGD/colon ?No problems Jess. ? ?PattySteven ?Pl put him for both EGD/colon with me when I am purple (4/17 or 4/18, even 4/19) ?Can run into late afternoon, if needed, as well ?RG ? ?----- Message ----- ?From: Loralie Champagne, PA-C ?Sent: 11/07/2021   9:55 AM EDT ?To: Gatha Mayer, MD, Timothy Lasso, RN, # ? ?Good morning.  This is a patient of [name].  He had a CT scan for weight loss, etc. He was also asking for colonoscopy due to history of colon polyps.  His CT scan came back showing the following: ? ?1. Abnormal nodular thickening of the right anterior pararenal ?fascia, suspicious for atypical malignancy such as lymphoma, sarcoma ?or metastatic disease. Consider tissue sampling. This should be ?amenable to biopsy under CT guidance. ?2. No evidence of pancreatic malignancy. There is irregular wall ?thickening of the proximal stomach which could reflect infiltrative ?malignancy. This would be best evaluated with endoscopy. ?3. No ascites or peritoneal nodularity. ?4. Distal colonic diverticulosis. ? ?I referred him to IR for biopsy, but I guess he probably needs an endoscopy as well.  Dr. Carlean Purl does not have any availability for a single or double procedure at the hospital until May. I have been told that Dr. Lyndel Safe is the only 1 that has anything sooner.  Dr. Lyndel Safe, are you willing to perform an EGD/colon on this patient?  Or Dr. Carlean Purl do think we should just proceed with the endoscopy alone for now?  If so, Dr. Lyndel Safe would you be willing to do EGD alone? ? ?Thank you both for your help! ? ?Jess ? ? ?

## 2021-11-11 ENCOUNTER — Ambulatory Visit (INDEPENDENT_AMBULATORY_CARE_PROVIDER_SITE_OTHER): Payer: Medicare HMO | Admitting: *Deleted

## 2021-11-11 ENCOUNTER — Telehealth: Payer: Self-pay | Admitting: Gastroenterology

## 2021-11-11 DIAGNOSIS — Z5181 Encounter for therapeutic drug level monitoring: Secondary | ICD-10-CM | POA: Diagnosis not present

## 2021-11-11 DIAGNOSIS — I240 Acute coronary thrombosis not resulting in myocardial infarction: Secondary | ICD-10-CM

## 2021-11-11 LAB — POCT INR: INR: 2.5 (ref 2.0–3.0)

## 2021-11-11 NOTE — Telephone Encounter (Signed)
Audree Camel from the [facility] called regarding patient's upcoming CT and procedure.  Please call her back at [phone], ext [phone]. ? ?Thank you. ?

## 2021-11-11 NOTE — Telephone Encounter (Signed)
Spoke with Judson Roch- she would like copies of office notes and CT scan sent to her attention [phone].  I have sent those to her as requested.  She is also going to fax a form to be completed for [facility] coverage.   ?

## 2021-11-11 NOTE — Patient Instructions (Signed)
Pending colonoscopy on 4/18 and Bx on 4/10 ?Will hold warfarin 5 days before both procedures.  Restart warfarin in between procedures. ?After last procedure resume 1 tablet daily except 1/2 tablet on Fridays ?Recheck on 12/04/21 ?

## 2021-11-12 NOTE — Progress Notes (Signed)
Remote ICD transmission.   

## 2021-11-13 ENCOUNTER — Other Ambulatory Visit: Payer: Self-pay | Admitting: Radiology

## 2021-11-13 ENCOUNTER — Other Ambulatory Visit (HOSPITAL_COMMUNITY)
Admission: RE | Admit: 2021-11-13 | Discharge: 2021-11-13 | Disposition: A | Discharge status: Home / Self Care | Payer: Medicare HMO | Source: Ambulatory Visit | Attending: Cardiology | Admitting: Cardiology

## 2021-11-13 DIAGNOSIS — Z5181 Encounter for therapeutic drug level monitoring: Secondary | ICD-10-CM | POA: Insufficient documentation

## 2021-11-13 LAB — BASIC METABOLIC PANEL
Anion gap: 6 (ref 5–15)
BUN: 33 mg/dL — ABNORMAL HIGH (ref 8–23)
CO2: 24 mmol/L (ref 22–32)
Calcium: 9.1 mg/dL (ref 8.9–10.3)
Chloride: 107 mmol/L (ref 98–111)
Creatinine, Ser: 1.79 mg/dL — ABNORMAL HIGH (ref 0.61–1.24)
GFR, Estimated: 39 mL/min — ABNORMAL LOW (ref >60)
Glucose, Bld: 102 mg/dL — ABNORMAL HIGH (ref 70–99)
Potassium: 4.2 mmol/L (ref 3.5–5.1)
Sodium: 137 mmol/L (ref 135–145)

## 2021-11-17 ENCOUNTER — Encounter (HOSPITAL_COMMUNITY): Payer: Self-pay

## 2021-11-17 ENCOUNTER — Ambulatory Visit (HOSPITAL_COMMUNITY)
Admission: RE | Admit: 2021-11-17 | Discharge: 2021-11-17 | Disposition: A | Discharge status: Home / Self Care | Payer: No Typology Code available for payment source | Source: Ambulatory Visit | Attending: Gastroenterology | Admitting: Gastroenterology

## 2021-11-17 ENCOUNTER — Other Ambulatory Visit: Payer: Self-pay

## 2021-11-17 DIAGNOSIS — Z951 Presence of aortocoronary bypass graft: Secondary | ICD-10-CM | POA: Diagnosis not present

## 2021-11-17 DIAGNOSIS — I2510 Atherosclerotic heart disease of native coronary artery without angina pectoris: Secondary | ICD-10-CM | POA: Insufficient documentation

## 2021-11-17 DIAGNOSIS — R933 Abnormal findings on diagnostic imaging of other parts of digestive tract: Secondary | ICD-10-CM | POA: Insufficient documentation

## 2021-11-17 DIAGNOSIS — I10 Essential (primary) hypertension: Secondary | ICD-10-CM | POA: Diagnosis not present

## 2021-11-17 DIAGNOSIS — R634 Abnormal weight loss: Secondary | ICD-10-CM | POA: Insufficient documentation

## 2021-11-17 DIAGNOSIS — E785 Hyperlipidemia, unspecified: Secondary | ICD-10-CM | POA: Insufficient documentation

## 2021-11-17 DIAGNOSIS — C8519 Unspecified B-cell lymphoma, extranodal and solid organ sites: Secondary | ICD-10-CM | POA: Diagnosis not present

## 2021-11-17 LAB — CBC
HCT: 44.9 % (ref 39.0–52.0)
Hemoglobin: 14.4 g/dL (ref 13.0–17.0)
MCH: 31.9 pg (ref 26.0–34.0)
MCHC: 32.1 g/dL (ref 30.0–36.0)
MCV: 99.6 fL (ref 80.0–100.0)
Platelets: 143 10*3/uL — ABNORMAL LOW (ref 150–400)
RBC: 4.51 MIL/uL (ref 4.22–5.81)
RDW: 12.9 % (ref 11.5–15.5)
WBC: 6.7 10*3/uL (ref 4.0–10.5)
nRBC: 0 % (ref 0.0–0.2)

## 2021-11-17 LAB — PROTIME-INR
INR: 1.3 — ABNORMAL HIGH (ref 0.8–1.2)
Prothrombin Time: 15.8 seconds — ABNORMAL HIGH (ref 11.4–15.2)

## 2021-11-17 MED ORDER — FENTANYL CITRATE (PF) 100 MCG/2ML IJ SOLN
INTRAMUSCULAR | Status: AC | PRN
  Administered 2021-11-17 (×2): 25 ug via INTRAVENOUS

## 2021-11-17 MED ORDER — MIDAZOLAM HCL 2 MG/2ML IJ SOLN
INTRAMUSCULAR | Status: AC
  Filled 2021-11-17: qty 2

## 2021-11-17 MED ORDER — MIDAZOLAM HCL 2 MG/2ML IJ SOLN
INTRAMUSCULAR | Status: AC | PRN
Start: 2021-11-17 — End: 2021-11-17
  Administered 2021-11-17: .5 mg via INTRAVENOUS
  Administered 2021-11-17: 1 mg via INTRAVENOUS

## 2021-11-17 MED ORDER — LIDOCAINE HCL 1 % IJ SOLN
INTRAMUSCULAR | Status: AC
  Filled 2021-11-17: qty 10

## 2021-11-17 MED ORDER — FENTANYL CITRATE (PF) 100 MCG/2ML IJ SOLN
INTRAMUSCULAR | Status: AC
  Filled 2021-11-17: qty 2

## 2021-11-17 MED ORDER — SODIUM CHLORIDE 0.9 % IV SOLN: INTRAVENOUS | Status: DC

## 2021-11-17 NOTE — H&P (Signed)
Chief Complaint: Patient was seen in consultation today for right abdominal mass biopsy at the request of Zehr,Jessica D  Referring Physician(s): Zehr,Jessica D  Supervising Physician: Pernell Dupre  Patient Status: [facility] - Out-pt  History of Present Illness: [name] [name] is a 76 y.o. male w/ PMH of CABG, MI, CAD, HLD and HTN. Pt c/o unintentional weight loss w/ family hx of pancreatic cancer. Pt has CT abd/pelvis 11/03/21 that showed abnormal thickening of right anterior pararenal fascia suspicious for malignancy such as lymphoma/sarcoma. Pt was referred by Doug Sou, PA for biopsy. Procedure was approved by Dr. Elby Showers, IR.   Past Medical History:  Diagnosis Date   ALLERGIC RHINITIS 10/31/2007   Qualifier: Diagnosis of  By: Jonny Ruiz MD, Len Blalock    Allergy    CHOLECYSTECTOMY, HX OF 06/04/2007   Qualifier: Diagnosis of  By: Genelle Gather CMA, [place]     COLONIC POLYPS, HX OF 10/31/2007   Qualifier: Diagnosis of  By: Jonny Ruiz MD, Len Blalock    CORONARY ARTERY BYPASS GRAFT, HX OF 06/04/2007   Qualifier: Diagnosis of  By: Genelle Gather CMA, [place]     CORONARY ARTERY DISEASE 06/04/2007   Qualifier: Diagnosis of  By: Genelle Gather CMA, [place]     Diverticulosis    Erectile dysfunction    Eye twitch    right eye since chilhood    HYPERLIPIDEMIA 06/04/2007   Qualifier: Diagnosis of  By: Genelle Gather CMA, [place]     Hypersomnolence    HYPERTENSION 06/04/2007   Qualifier: Diagnosis of  By: Genelle Gather CMA, [place]     Hypothyroidism    ISCHEMIC CARDIOMYOPATHY 06/04/2007   Qualifier: Diagnosis of  By: Genelle Gather CMA, [place]     Myocardial infarction (HCC)    per patient , his occurred in 1998    Osteoarthritis    PLMD (periodic limb movement disorder)    Sleep apnea    no cpap' per patient , "i was checked for it and they said i didnt have it "    Vertigo    Vitamin D deficiency     Past Surgical History:  Procedure Laterality Date   CHOLECYSTECTOMY     COLONOSCOPY     CORONARY ARTERY BYPASS GRAFT      ICD IMPLANT N/A 01/30/2020   Procedure: ICD IMPLANT;  Surgeon: Hillis Range, MD;  Location: [facility] INV[facility]IVE [facility];  Service: Cardiovascular;  Laterality: N/A;   KNEE ARTHROPLASTY Left 08/05/2017   Procedure: LEFT TOTAL KNEE ARTHROPLASTY WITH COMPUTER NAVIGATION;  Surgeon: Samson Frederic, MD;  Location: [facility] OR[facility];  Service: Orthopedics;  Laterality: Left;  Needs RNFA   PENILE PROSTHESIS IMPLANT     REPLACEMENT TOTAL KNEE  06/12/2013   spinal cyst     THORACOTOMY     left anterior; wound exploration and debridement   TONSILLECTOMY AND ADENOIDECTOMY     age 64    Allergies: Ace inhibitors and Statins  Medications: Prior to Admission medications   Medication Sig Start Date End Date Taking? Authorizing Provider  ARTIFICIAL TEAR SOLUTION OP Place 1 drop into both eyes daily as needed (dry eyes).   Yes [provider]  Ascorbic Acid (VITA-C PO) Take 1 tablet by mouth daily.   Yes [provider]  b complex vitamins capsule Take 1 capsule by mouth daily.   Yes [provider]  empagliflozin (JARDIANCE) 10 MG TABS tablet Take 1 tablet (10 mg total) by mouth daily before breakfast. 05/08/21  Yes Crenshaw, Madolyn Frieze, MD  ezetimibe (ZETIA) 10 MG tablet  TAKE 1 TABLET EVERY DAY 07/21/21  Yes Lewayne Bunting, MD  furosemide (LASIX) 20 MG tablet Take 20 mg by mouth daily.   Yes [provider]  metoprolol succinate (TOPROL-XL) 50 MG 24 hr tablet TAKE 1 TABLET DAILY. TAKE WITH OR IMMEDIATELY FOLLOWING A MEAL. 05/28/21  Yes Lewayne Bunting, MD  Multiple Vitamins-Minerals (MULTIVITAMIN WITH MINERALS) tablet Take 1 tablet by mouth daily. Centrum silver   Yes [provider]  rosuvastatin (CRESTOR) 5 MG tablet TAKE 1 TABLET EVERY OTHER DAY Patient taking differently: Take 5 mg by mouth every other day. 07/09/21  Yes Lewayne Bunting, MD  spironolactone (ALDACTONE) 25 MG tablet Take 0.5 tablets (12.5 mg total) by mouth daily. 01/14/21 11/17/21 Yes Lewayne Bunting, MD   triamcinolone (NASACORT) 55 MCG/ACT AERO nasal inhaler Place 2 sprays into the nose daily. Patient taking differently: Place 2 sprays into the nose daily as needed (allergies). 03/22/19  Yes Corwin Levins, MD  valsartan (DIOVAN) 160 MG tablet Take 1 tablet (160 mg total) by mouth daily. 11/06/21  Yes Lewayne Bunting, MD  VITAMIN D PO Take 1 capsule by mouth daily.   Yes [provider]  warfarin (COUMADIN) 5 MG tablet TAKE 1 TABLET EVERY DAY AT 4 PM 05/29/21   Corwin Levins, MD     Family History  Problem Relation Age of Onset   Stomach cancer Mother        smokes   Lung cancer Father        chewed tobacco   Heart disease Brother        first MI at 39yo, now 67 for transplant list/ ICM   Pancreatic cancer Brother    Colon cancer Maternal Grandfather    COPD Son        was a smoker   Pancreatic cancer Maternal Uncle    Pancreatic cancer Cousin        mat side x 4   Esophageal cancer Neg Hx    Prostate cancer Neg Hx    Rectal cancer Neg Hx     Social History   Socioeconomic History   Marital status: Married    Spouse name: Kathie Rhodes   Number of children: 3   Years of education: Not on file   Highest education level: Not on file  Occupational History   Occupation: truck Air traffic controller: [employer]  Tobacco Use   Smoking status: Former    Packs/day: 4.00    Years: 17.00    Pack years: 68.00    Types: Cigarettes    Quit date: 05/07/1981    Years since quitting: 40.5   Smokeless tobacco: Never   Tobacco comments:    Pt states that he would let most of them "burn" pt states that he used anywhere between 4-5PPD  Vaping Use   Vaping Use: Never used  Substance and Sexual Activity   Alcohol use: Yes    Alcohol/week: 0.0 standard drinks    Comment: rare   Drug use: No   Sexual activity: Not on file  Other Topics Concern   Not on file  Social History Narrative   Lives in [place] with spouse   Retired Naval architect   Social Determinants of Manufacturing engineer Strain: Low Risk    Difficulty of Paying Living Expenses: Not hard at all  Food Insecurity: No Food Insecurity   Worried About Programme researcher, broadcasting/film/video in the Last Year: Never true  Ran Out of Food in the Last Year: Never true  Transportation Needs: No Transportation Needs   Lack of Transportation (Medical): No   Lack of Transportation (Non-Medical): No  Physical Activity: Inactive   Days of Exercise per Week: 0 days   Minutes of Exercise per Session: 0 min  Stress: No Stress Concern Present   Feeling of Stress : Not at all  Social Connections: Moderately Isolated   Frequency of Communication with Friends and Family: More than three times a week   Frequency of Social Gatherings with Friends and Family: More than three times a week   Attends Religious Services: Never   Database administrator or Organizations: No   Attends Banker Meetings: Never   Marital Status: Married    Review of Systems: A 12 point ROS discussed and pertinent positives are indicated in the HPI above.  All other systems are negative.  Review of Systems  Constitutional:  Positive for appetite change and unexpected weight change. Negative for chills and fever.  Respiratory:  Negative for cough and shortness of breath.   Cardiovascular:  Positive for leg swelling. Negative for chest pain.  Gastrointestinal:  Negative for abdominal pain, nausea and vomiting.  Neurological:  Positive for dizziness and syncope.   Vital Signs: BP 108/61   Pulse (!) 54   Resp 17   Ht 5\' 10"  (1.778 m)   Wt 208 lb (94.3 kg)   SpO2 95%   BMI 29.84 kg/m   Physical Exam Constitutional:      Appearance: Normal appearance. He is not ill-appearing.  HENT:     Head: Normocephalic and atraumatic.     Mouth/Throat:     Mouth: Mucous membranes are dry.     Pharynx: Oropharynx is clear.  Eyes:     Extraocular Movements: Extraocular movements intact.     Pupils: Pupils are equal, round, and reactive to  light.  Cardiovascular:     Rate and Rhythm: Normal rate and regular rhythm.     Pulses: Normal pulses.     Heart sounds: Normal heart sounds. No murmur heard. Pulmonary:     Effort: Pulmonary effort is normal. No respiratory distress.     Breath sounds: Wheezing present.     Comments: Expiratory wheeze to LU and LL fields Abdominal:     General: Bowel sounds are normal.     Tenderness: There is no abdominal tenderness. There is no guarding.  Musculoskeletal:     Right lower leg: No edema.     Left lower leg: No edema.  Skin:    General: Skin is warm and dry.  Neurological:     Mental Status: He is alert and oriented to person, place, and time.  Psychiatric:        Mood and Affect: Mood normal.        Behavior: Behavior normal.        Thought Content: Thought content normal.        Judgment: Judgment normal.    Imaging: CT Abdomen Pelvis W Contrast  Result Date: 11/03/2021 CLINICAL DATA:  Unintentional weight loss. Family history of pancreatic cancer. No personal history of malignancy. EXAM: CT ABDOMEN AND PELVIS WITH CONTRAST TECHNIQUE: Multidetector CT imaging of the abdomen and pelvis was performed using the standard protocol following bolus administration of intravenous contrast. RADIATION DOSE REDUCTION: This exam was performed according to the departmental dose-optimization program which includes automated exposure control, adjustment of the mA and/or kV according to patient size and/or  use of iterative reconstruction technique. CONTRAST:  OMNIPAQUE IOHEXOL 300 MG/ML  SOLN COMPARISON:  None. FINDINGS: Lower chest: Mild emphysematous changes and scarring at both lung bases. No significant pleural or pericardial effusion. AICD leads are noted. Hepatobiliary: The liver is normal in density without suspicious focal abnormality. No significant biliary dilatation status post cholecystectomy. Pancreas: Unremarkable. No pancreatic ductal dilatation or surrounding inflammatory  changes. No evidence of pancreatic mass. Spleen: Normal in size without focal abnormality. Adrenals/Urinary Tract: Both adrenal glands appear normal. The kidneys appear normal without evidence of urinary tract calculus, suspicious lesion or hydronephrosis. No bladder abnormalities are seen. Stomach/Bowel: Enteric contrast was administered and has passed into the distal small bowel. There is irregular nodular wall thickening of the gastric cardia extending along the lesser curvature of the stomach. This is more than expected for the degree of gastric distension and could reflect an infiltrating mass. The small bowel, appendix and proximal colon appear normal. There are diverticular changes of the descending and sigmoid colon. No evidence of bowel malignancy. Vascular/Lymphatic: There are no enlarged abdominal or pelvic lymph nodes. Aortic and branch vessel atherosclerosis without aneurysm or acute vascular findings. The portal, superior mesenteric and splenic veins are patent. Reproductive: Minimal heterogeneity of the prostate gland. The seminal vesicles appear normal. Partially imaged penile prosthesis. Suprapubic reservoir appears deflated. Other: There is irregular nodular thickening of the anterior pararenal fascia on the right, measuring up to 6.5 x 1.6 cm transverse on image 52/2. The left perirenal fascia is mildly thickened without nodularity. There is no ascites or peritoneal nodularity. Musculoskeletal: No acute or significant osseous findings. Multilevel lumbar spondylosis. IMPRESSION: 1. Abnormal nodular thickening of the right anterior pararenal fascia, suspicious for atypical malignancy such as lymphoma, sarcoma or metastatic disease. Consider tissue sampling. This should be amenable to biopsy under CT guidance. 2. No evidence of pancreatic malignancy. There is irregular wall thickening of the proximal stomach which could reflect infiltrative malignancy. This would be best evaluated with endoscopy. 3.  No ascites or peritoneal nodularity. 4. Distal colonic diverticulosis. 5.  Aortic Atherosclerosis (ICD10-I70.0). 6. These results will be called to the ordering clinician or representative by the Radiologist Assistant, and communication documented in the PACS or [employer]. Electronically Signed   By: Carey Bullocks M.D.   On: 11/03/2021 16:33   CUP PACEART REMOTE DEVICE CHECK  Result Date: 10/29/2021 Scheduled remote reviewed. Normal device function.  Optivol crossed threshold 2/22 and is ongoing Next remote 91 days. LA   Labs:  CBC: Recent Labs    12/18/20 0901 10/09/21 1107 11/17/21 0830  WBC 6.8 8.2 6.7  HGB 15.0 14.5 14.4  HCT 44.2 43.8 44.9  PLT 122* 174.0 143*    COAGS: Recent Labs    09/16/21 1053 10/14/21 0959 11/11/21 0943 11/17/21 0830  INR 2.4 1.9* 2.5 1.3*    BMP: Recent Labs    01/24/21 0838 05/21/21 0911 10/09/21 1107 10/24/21 0930 11/13/21 1533  NA 137 136 137 140 137  K 4.9 4.9 4.3 4.9 4.2  CL 102 103 99 105 107  CO2 29 29 29 28 24   GLUCOSE 134* 118* 114* 115* 102*  BUN 29* 29* 27* 30* 33*  CALCIUM 9.2 8.8* 9.6 9.1 9.1  CREATININE 1.32* 1.36* 1.45 1.41* 1.79*  GFRNONAA 56* 54*  --  52* 39*    LIVER FUNCTION TESTS: Recent Labs    05/21/21 0911 10/09/21 1107  BILITOT 0.9 1.0  AST 21 20  ALT 21 16  ALKPHOS 49 57  PROT 7.1 7.2  ALBUMIN 4.0 4.4    TUMOR MARKERS: No results for input(s): AFPTM, CEA, CA199, CHROMGRNA in the last 8760 hours.  Assessment and Plan: History of CABG, MI, CAD, HLD and HTN. Pt c/o unintentional weight loss w/ family hx of pancreatic cancer. Pt has CT abd/pelvis 11/03/21 that showed abnormal thickening of right anterior pararenal fascia suspicious for malignancy such as lymphoma/sarcoma. Pt was referred by Doug Sou, PA for biopsy. Procedure was approved by Dr. Elby Showers, IR.   Pt sitting upright on stretcher. Dr. Juliette Alcide at bedside discussing procedure with pt.  Pt is A&O, calm and pleasant. He is in no  distress.  Pt states he is NPO per order.  Pt states he stopped taking warfarin 5 days ago. Pt's INR today 1.3 Other labs WNL.  Risks and benefits of right abdominal mass biopsy was discussed with the patient and/or patient's family including, but not limited to bleeding, infection, damage to adjacent structures or low yield requiring additional tests.  All of the questions were answered and there is agreement to proceed.  Consent signed and in chart.   Thank you for this interesting consult.  I greatly enjoyed meeting [name] [name] and look forward to participating in their care.  A copy of this report was sent to the requesting provider on this date.  Electronically Signed: Shon Hough, NP 11/17/2021, 9:35 AM   I spent a total of 20 minutes in face to face in clinical consultation, greater than 50% of which was counseling/coordinating care for right abdominal mass biopsy with moderate sedation.

## 2021-11-17 NOTE — Progress Notes (Signed)
Client and his wife notified to resume coumadin tonight and stop per Dr Jacalyn Lefevre office for colonoscopy and they both voiced understanding ?

## 2021-11-17 NOTE — Procedures (Signed)
Interventional Radiology Procedure Note ? ?Date of Procedure: 11/17/2021  ?Procedure: CT guided abdominal mass biopsy  ? ?Findings:  ?1. Successful CT guided abdominal mass biopsy, 18ga x5 passes   ? ?Complications: No immediate complications noted.  ? ?Estimated Blood Loss: minimal ? ?Follow-up and Recommendations: ?1. Bedrest 1 hour  ? ? ?Albin Felling, MD  ?Vascular & Interventional Radiology  ?11/17/2021 10:39 AM ? ? ? ?

## 2021-11-17 NOTE — Progress Notes (Signed)
Per Sylvie Farrier client may resume coumadin tonight and follow instructions from Dr Jacalyn Lefevre office for stopping coumadin prior to colonoscopy ?

## 2021-11-18 ENCOUNTER — Telehealth: Payer: Self-pay

## 2021-11-18 ENCOUNTER — Other Ambulatory Visit: Payer: Self-pay | Admitting: *Deleted

## 2021-11-18 ENCOUNTER — Ambulatory Visit (INDEPENDENT_AMBULATORY_CARE_PROVIDER_SITE_OTHER): Payer: Non-veteran care

## 2021-11-18 DIAGNOSIS — Z95810 Presence of automatic (implantable) cardiac defibrillator: Secondary | ICD-10-CM

## 2021-11-18 DIAGNOSIS — I5042 Chronic combined systolic (congestive) and diastolic (congestive) heart failure: Secondary | ICD-10-CM

## 2021-11-18 DIAGNOSIS — N289 Disorder of kidney and ureter, unspecified: Secondary | ICD-10-CM

## 2021-11-18 NOTE — Progress Notes (Signed)
EPIC Encounter for ICM Monitoring ? ?Patient Name: [name] [name] is a 76 y.o. male ?Date: 11/18/2021 ?Primary Care Physican: Biagio Borg, MD ?Primary Cardiologist: [name] ?Electrophysiologist: Allred ?09/10/2021 Weight:   206 lbs ?10/07/2021 Weight: 206 lbs (does not weight at home) ?                                                         ?  ?Attempted call to patient and unable to reach.  Left detailed message per DPR regarding transmission. Transmission reviewed.  ?   ?Optivol thoracic impedance suggesting fluid levels returned to normal after adjusting diet.    Fluid index > normal threshold starting 2/22. ?  ?Prescribed: ?Furosemide 20 mg take 1 tablet daily   ?Jardiance 10 mg take 1 tablet before breakfast ?  ?Labs: ?10/24/2021 Creatinine 1.41, BUN 30, Potassium 4.9, Sodium 140, GFR 52 ?10/09/2021 Creatinine 1.45, BUN 27, Potassium 4.3, Sodium 137 ?A complete set of results can be found in Results Review. ?  ?Recommendations:  Left voice mail with ICM number and encouraged to call if experiencing any fluid symptoms.s.    ?  ?Follow-up plan: ICM clinic phone appointment on 12/15/2021.   91 day device clinic remote transmission 01/27/2022.   ?  ?EP/Cardiology Office Visits: 03/03/2022 with Dr [name].  4/3 Message sent to EP scheduler to call patient to make overdue appt.  Recall 11/10/2020 with Oda Kilts, PA. ?  ?Copy of ICM check sent to Dr. Rayann Heman. ? ?3 month ICM trend: 11/17/2021. ? ? ? ?12-14 Month ICM trend:  ? ? ? ?Rosalene Billings, RN ?11/18/2021 ?1:08 PM ? ?

## 2021-11-18 NOTE — Telephone Encounter (Signed)
Remote ICM transmission received.  Attempted call to patient regarding ICM remote transmission and left detailed message per DPR.  Advised to return call for any fluid symptoms or questions. Next ICM remote transmission scheduled 12/15/2021.   ? ?

## 2021-11-19 ENCOUNTER — Other Ambulatory Visit: Payer: Self-pay

## 2021-11-19 ENCOUNTER — Encounter (HOSPITAL_COMMUNITY): Payer: Self-pay | Admitting: Gastroenterology

## 2021-11-19 ENCOUNTER — Telehealth: Payer: Self-pay | Admitting: Gastroenterology

## 2021-11-19 DIAGNOSIS — C8510 Unspecified B-cell lymphoma, unspecified site: Secondary | ICD-10-CM

## 2021-11-19 LAB — SURGICAL PATHOLOGY

## 2021-11-19 NOTE — Telephone Encounter (Signed)
Dr. Thornell Sartorius, [facility] Pathologist, would like Janett Billow to call him regarding this patient.  Phone Number - [phone] ? ?Thank you. ?

## 2021-11-20 ENCOUNTER — Telehealth: Payer: Self-pay | Admitting: Gastroenterology

## 2021-11-20 NOTE — Telephone Encounter (Signed)
Fenda from [place] called wanting to get patients pre authorization number for patients procedure on 4/18. Please advise.  ? ? ?[phone] ? ?Ext: [phone] ?

## 2021-11-21 NOTE — Telephone Encounter (Signed)
I spoke with Judson Roch at the [facility] and she has received the request form for service and will work on getting the [place] today so that the pt can be scheduled with oncology.  ?

## 2021-11-21 NOTE — Telephone Encounter (Signed)
[facility] referral form completed and faxed to them on 4/12.  Oncology is waiting for the auth to set up the appt.  The pt has been advised that as soon as the [place] responds he will be scheduled.  ?

## 2021-11-21 NOTE — Telephone Encounter (Signed)
[name] [name] returned call and states that they has the authorization for oncology. She has spoken with the oncology at the [facility] location and they will get him set up and call pt on Monday.   ?

## 2021-11-24 NOTE — Telephone Encounter (Signed)
[name] [name] is calling back again wanting patients pre authorization number for patients procedure tomorrow 4/18. Please advise.   ?

## 2021-11-25 ENCOUNTER — Other Ambulatory Visit: Payer: Self-pay

## 2021-11-25 ENCOUNTER — Encounter (HOSPITAL_COMMUNITY): Payer: Self-pay | Admitting: Gastroenterology

## 2021-11-25 ENCOUNTER — Ambulatory Visit (HOSPITAL_BASED_OUTPATIENT_CLINIC_OR_DEPARTMENT_OTHER): Payer: No Typology Code available for payment source | Admitting: Anesthesiology

## 2021-11-25 ENCOUNTER — Encounter (HOSPITAL_COMMUNITY): Payer: Self-pay

## 2021-11-25 ENCOUNTER — Ambulatory Visit (HOSPITAL_COMMUNITY)
Admission: RE | Admit: 2021-11-25 | Discharge: 2021-11-25 | Disposition: A | Discharge status: Home / Self Care | Payer: No Typology Code available for payment source | Attending: Gastroenterology | Admitting: Gastroenterology

## 2021-11-25 ENCOUNTER — Telehealth: Payer: Self-pay

## 2021-11-25 ENCOUNTER — Encounter (HOSPITAL_COMMUNITY)
Admission: RE | Disposition: A | Discharge status: Home / Self Care | Payer: Self-pay | Source: Home / Self Care | Attending: Gastroenterology

## 2021-11-25 ENCOUNTER — Ambulatory Visit (HOSPITAL_COMMUNITY): Payer: No Typology Code available for payment source | Admitting: Anesthesiology

## 2021-11-25 DIAGNOSIS — R6881 Early satiety: Secondary | ICD-10-CM | POA: Insufficient documentation

## 2021-11-25 DIAGNOSIS — R933 Abnormal findings on diagnostic imaging of other parts of digestive tract: Secondary | ICD-10-CM

## 2021-11-25 DIAGNOSIS — R1013 Epigastric pain: Secondary | ICD-10-CM | POA: Diagnosis not present

## 2021-11-25 DIAGNOSIS — R634 Abnormal weight loss: Secondary | ICD-10-CM | POA: Insufficient documentation

## 2021-11-25 DIAGNOSIS — K635 Polyp of colon: Secondary | ICD-10-CM

## 2021-11-25 DIAGNOSIS — C8333 Diffuse large B-cell lymphoma, intra-abdominal lymph nodes: Secondary | ICD-10-CM | POA: Diagnosis not present

## 2021-11-25 DIAGNOSIS — Z86718 Personal history of other venous thrombosis and embolism: Secondary | ICD-10-CM | POA: Insufficient documentation

## 2021-11-25 DIAGNOSIS — Z6829 Body mass index (BMI) 29.0-29.9, adult: Secondary | ICD-10-CM | POA: Diagnosis not present

## 2021-11-25 DIAGNOSIS — Z95810 Presence of automatic (implantable) cardiac defibrillator: Secondary | ICD-10-CM | POA: Diagnosis not present

## 2021-11-25 DIAGNOSIS — Z800 Family history of malignant neoplasm of digestive organs: Secondary | ICD-10-CM | POA: Diagnosis not present

## 2021-11-25 DIAGNOSIS — I252 Old myocardial infarction: Secondary | ICD-10-CM | POA: Diagnosis not present

## 2021-11-25 DIAGNOSIS — C8510 Unspecified B-cell lymphoma, unspecified site: Secondary | ICD-10-CM

## 2021-11-25 DIAGNOSIS — I110 Hypertensive heart disease with heart failure: Secondary | ICD-10-CM | POA: Diagnosis not present

## 2021-11-25 DIAGNOSIS — K5730 Diverticulosis of large intestine without perforation or abscess without bleeding: Secondary | ICD-10-CM | POA: Insufficient documentation

## 2021-11-25 DIAGNOSIS — D120 Benign neoplasm of cecum: Secondary | ICD-10-CM | POA: Diagnosis not present

## 2021-11-25 DIAGNOSIS — Z1211 Encounter for screening for malignant neoplasm of colon: Secondary | ICD-10-CM | POA: Insufficient documentation

## 2021-11-25 DIAGNOSIS — I509 Heart failure, unspecified: Secondary | ICD-10-CM | POA: Insufficient documentation

## 2021-11-25 DIAGNOSIS — M1990 Unspecified osteoarthritis, unspecified site: Secondary | ICD-10-CM | POA: Insufficient documentation

## 2021-11-25 DIAGNOSIS — K3189 Other diseases of stomach and duodenum: Secondary | ICD-10-CM

## 2021-11-25 DIAGNOSIS — Z86010 Personal history of colonic polyps: Secondary | ICD-10-CM

## 2021-11-25 DIAGNOSIS — K640 First degree hemorrhoids: Secondary | ICD-10-CM | POA: Insufficient documentation

## 2021-11-25 DIAGNOSIS — Z951 Presence of aortocoronary bypass graft: Secondary | ICD-10-CM | POA: Insufficient documentation

## 2021-11-25 DIAGNOSIS — C8519 Unspecified B-cell lymphoma, extranodal and solid organ sites: Secondary | ICD-10-CM | POA: Diagnosis not present

## 2021-11-25 HISTORY — PX: BIOPSY: SHX5522

## 2021-11-25 HISTORY — PX: POLYPECTOMY: SHX5525

## 2021-11-25 HISTORY — PX: COLONOSCOPY WITH PROPOFOL: SHX5780

## 2021-11-25 HISTORY — PX: ESOPHAGOGASTRODUODENOSCOPY (EGD) WITH PROPOFOL: SHX5813

## 2021-11-25 LAB — PROTIME-INR
INR: 1.1 (ref 0.8–1.2)
Prothrombin Time: 14.5 seconds (ref 11.4–15.2)

## 2021-11-25 SURGERY — COLONOSCOPY WITH PROPOFOL
Anesthesia: Monitor Anesthesia Care

## 2021-11-25 MED ORDER — SODIUM CHLORIDE 0.9 % IV SOLN: INTRAVENOUS | Status: DC

## 2021-11-25 MED ORDER — PHENYLEPHRINE 80 MCG/ML (10ML) SYRINGE FOR IV PUSH (FOR BLOOD PRESSURE SUPPORT)
PREFILLED_SYRINGE | INTRAVENOUS | Status: DC | PRN
  Administered 2021-11-25: 80 ug via INTRAVENOUS
  Administered 2021-11-25 (×2): 160 ug via INTRAVENOUS
  Administered 2021-11-25: 80 ug via INTRAVENOUS

## 2021-11-25 MED ORDER — PANTOPRAZOLE SODIUM 40 MG PO TBEC: 40.0000 mg | DELAYED_RELEASE_TABLET | Freq: Every day | ORAL | 3 refills | Status: DC

## 2021-11-25 MED ORDER — LACTATED RINGERS IV SOLN: INTRAVENOUS | Status: DC

## 2021-11-25 MED ORDER — PROPOFOL 500 MG/50ML IV EMUL
INTRAVENOUS | Status: DC | PRN
  Administered 2021-11-25: 150 ug/kg/min via INTRAVENOUS

## 2021-11-25 MED ORDER — LIDOCAINE HCL (CARDIAC) PF 100 MG/5ML IV SOSY
PREFILLED_SYRINGE | INTRAVENOUS | Status: DC | PRN
Start: 2021-11-25 — End: 2021-11-25
  Administered 2021-11-25: 60 mg via INTRAVENOUS

## 2021-11-25 MED ORDER — PROPOFOL 10 MG/ML IV BOLUS
INTRAVENOUS | Status: DC | PRN
Start: 2021-11-25 — End: 2021-11-25
  Administered 2021-11-25 (×2): 20 mg via INTRAVENOUS

## 2021-11-25 SURGICAL SUPPLY — 25 items

## 2021-11-25 NOTE — Op Note (Addendum)
[facility] ?Patient Name: [name] [name] ?Procedure Date: 11/25/2021 ?MRN: [id number] ?Attending MD: Jackquline Denmark , MD ?Date of Birth: [birth year] ?CSN: [id number] ?Age: 76 ?Admit Type: Outpatient ?Procedure:                Upper GI endoscopy ?Indications:              Epigastric abdominal pain with wt loss and early  ?                          satiety. Recent bx from intra-abdominal mass with  ?                          high-grade B-cell lymphoma-Burkitt's type. ?Providers:                Jackquline Denmark, MD, Jaci Carrel, RN, Narda Rutherford  ?                          Carin Primrose CRNA ?Referring MD:              ?Medicines:                Monitored Anesthesia Care ?Complications:            No immediate complications. ?Estimated Blood Loss:     Estimated blood loss: none. ?Procedure:                Pre-Anesthesia Assessment: ?                          - Prior to the procedure, a History and Physical  ?                          was performed, and patient medications and  ?                          allergies were reviewed. The patient's tolerance of  ?                          previous anesthesia was also reviewed. The risks  ?                          and benefits of the procedure and the sedation  ?                          options and risks were discussed with the patient.  ?                          All questions were answered, and informed consent  ?                          was obtained. Prior Anticoagulants: Coumadin was  ?                          held 5 days prior. ASA Grade Assessment: III - A  ?  patient with severe systemic disease. After  ?                          reviewing the risks and benefits, the patient was  ?                          deemed in satisfactory condition to undergo the  ?                          procedure. ?                          After obtaining informed consent, the endoscope was  ?                          passed under direct vision.  Throughout the  ?                          procedure, the patient's blood pressure, pulse, and  ?                          oxygen saturations were monitored continuously. The  ?                          [id number]-[id number] ([id number]) Olympus endoscope was introduced  ?                          through the mouth, and advanced to the second part  ?                          of duodenum. The upper GI endoscopy was  ?                          accomplished without difficulty. The patient  ?                          tolerated the procedure well. ?Scope In: ?Scope Out: ?Findings: ?     The examined esophagus was normal with well-defined Z-line at 40 cm. ?     Diffuse nodular polypoid mucosa was found in the gastric fundus and in  ?     the gastric body, most prominently along the lesser curvature. Antrum  ?     was relatively spared. Multiple biopsies were taken with a cold forceps  ?     for histology. No ulcers were noted. The mucosa however, was friable. ?     The examined duodenum was normal. ?Impression:               - Nodular mucosa in the gastric fundus/body.  ?                          Biopsied. (Highly suspicious of gastric involvement  ?                          from lymphoma) ?Moderate Sedation: ?     Not Applicable - Patient had care per Anesthesia. ?  Recommendation:           - Patient has a contact number available for  ?                          emergencies. The signs and symptoms of potential  ?                          delayed complications were discussed with the  ?                          patient. Return to normal activities tomorrow.  ?                          Written discharge instructions were provided to the  ?                          patient. ?                          - Resume previous diet. ?                          - Continue present medications. ?                          - Await pathology results. ?                          - Start Protonix 40 mg p.o. QD ?                          - Resume Coumadin from  tomorrow onwards. ?                          - The findings and recommendations were discussed  ?                          with the patient's family. ?                          - I will get in touch with Alonza Bogus and Dr.  ?                          Carlean Purl ?Procedure Code(s):        --- Professional --- ?                          [phone], Esophagogastroduodenoscopy, flexible,  ?                          transoral; with biopsy, single or multiple ?Diagnosis Code(s):        --- Professional --- ?                          K31.89, Other diseases of stomach and duodenum ?  R10.13, Epigastric pain ?CPT copyright 2019 American Medical Association. All rights reserved. ?The codes documented in this report are preliminary and upon coder review may  ?be revised to meet current compliance requirements. ?Jackquline Denmark, MD ?11/25/2021 11:36:49 AM ?This report has been signed electronically. ?Number of Addenda: 0 ?

## 2021-11-25 NOTE — Anesthesia Procedure Notes (Signed)
Procedure Name: [place] ?Date/Time: 11/25/2021 11:03 AM ?Performed by: Lieutenant Diego, CRNA ?Pre-anesthesia Checklist: Patient identified, Emergency Drugs available, Suction available, Patient being monitored and Timeout performed ?Patient Re-evaluated:Patient Re-evaluated prior to induction ?Oxygen Delivery Method: Simple face mask ?Preoxygenation: Pre-oxygenation with 100% oxygen ?Induction Type: IV induction ? ? ? ? ?

## 2021-11-25 NOTE — Anesthesia Postprocedure Evaluation (Signed)
Anesthesia Post Note ? ?Patient: [name] [name] ? ?Procedure(s) Performed: COLONOSCOPY WITH PROPOFOL ?ESOPHAGOGASTRODUODENOSCOPY (EGD) WITH PROPOFOL ?BIOPSY ?POLYPECTOMY ? ?  ? ?Patient location during evaluation: PACU ?Anesthesia Type: MAC ?Level of consciousness: awake and alert ?Pain management: pain level controlled ?Vital Signs Assessment: post-procedure vital signs reviewed and stable ?Respiratory status: spontaneous breathing, nonlabored ventilation and respiratory function stable ?Cardiovascular status: stable and blood pressure returned to baseline ?Anesthetic complications: no ? ? ?No notable events documented. ? ?Last Vitals:  ?Vitals:  ? 11/25/21 1140 11/25/21 1150  ?BP: 104/66 123/76  ?Pulse: 72 69  ?Resp: 19 16  ?Temp:    ?SpO2: 100% 99%  ?  ?Last Pain:  ?Vitals:  ? 11/25/21 1126  ?TempSrc: Temporal  ?PainSc:   ? ? ?  ?  ?  ?  ?  ?  ? ?Audry Pili ? ? ? ? ?

## 2021-11-25 NOTE — Transfer of Care (Signed)
Immediate Anesthesia Transfer of Care Note ? ?Patient: [name] [name] ? ?Procedure(s) Performed: COLONOSCOPY WITH PROPOFOL ?ESOPHAGOGASTRODUODENOSCOPY (EGD) WITH PROPOFOL ?BIOPSY ?POLYPECTOMY ? ?Patient Location: Endoscopy Unit ? ?Anesthesia Type:General ? ?Level of Consciousness: drowsy and patient cooperative ? ?Airway & Oxygen Therapy: Patient Spontanous Breathing and Patient connected to face mask oxygen ? ?Post-op Assessment: Report given to RN and Post -op Vital signs reviewed and stable ? ?Post vital signs: Reviewed and stable ? ?Last Vitals:  ?Vitals Value Taken Time  ?BP 100/64 11/25/21 1126  ?Temp    ?Pulse 35 11/25/21 1128  ?Resp 19 11/25/21 1128  ?SpO2 100 % 11/25/21 1128  ?Vitals shown include unvalidated device data. ? ?Last Pain:  ?Vitals:  ? 11/25/21 0954  ?TempSrc: Oral  ?PainSc: 0-No pain  ?   ? ?  ? ?Complications: No notable events documented. ?

## 2021-11-25 NOTE — Anesthesia Preprocedure Evaluation (Addendum)
Anesthesia Evaluation  ?Patient identified by MRN, date of birth, ID band ?Patient awake ? ? ? ?Reviewed: ?Allergy & Precautions, NPO status , Patient's Chart, lab work & pertinent test results, reviewed documented beta blocker date and time  ? ?History of Anesthesia Complications ?Negative for: history of anesthetic complications ? ?Airway ?Mallampati: II ? ?TM Distance: >3 FB ?Neck ROM: Full ? ? ? Dental ? ?(+) Dental Advisory Given, Partial Lower ?  ?Pulmonary ?sleep apnea , former smoker,  ?  ?Pulmonary exam normal ? ? ? ? ? ? ? Cardiovascular ?hypertension, Pt. on medications and Pt. on home beta blockers ?+ CAD, + Past MI and + CABG  ?Normal cardiovascular exam+ Cardiac Defibrillator ? ? ? ?'22 Nuc Stress - Nuclear stress EF: 29%. ?There was no ST segment deviation noted during stress. ?Defect 1: There is a medium defect of severe severity present in the mid anterior, mid anteroseptal, apical anterior, apical septal and apex location. ?Defect 2: There is a medium defect of moderate severity present in the mid inferior, mid inferolateral, apical inferior and apical lateral location. ?Findings consistent with prior myocardial infarction in the LAD and RCA territories. ? ?'22 TTE - Limited visualization of the endocardium. Anteroseptum, inferoseptum, apex are akinetic. EF 30 to 35%. Regional ?wall motion abnormalities (see scoring diagram/findings for description). There is mild left ventricular hypertrophy. Mild mitral valve regurgitation. Aortic valve regurgitation is mild. Mild aortic valve stenosis.  ? ?  ?Neuro/Psych ?negative neurological ROS ? negative psych ROS  ? GI/Hepatic ?negative GI ROS, Neg liver ROS,   ?Endo/Other  ?Hypothyroidism  ? Renal/GU ?Renal InsufficiencyRenal disease  ? ?  ?Musculoskeletal ? ?(+) Arthritis ,  ? Abdominal ?  ?Peds ? Hematology ? ?Plt 143k ?   ?Anesthesia Other Findings ? ? Reproductive/Obstetrics ? ?  ? ? ? ? ? ? ? ? ? ? ? ? ? ?  ?   ? ? ? ? ? ? ? ?Anesthesia Physical ?Anesthesia Plan ? ?ASA: 4 ? ?Anesthesia Plan: MAC  ? ?Post-op Pain Management:   ? ?Induction:  ? ?PONV Risk Score and Plan: 1 and Propofol infusion and Treatment may vary due to age or medical condition ? ?Airway Management Planned: Nasal Cannula and Natural Airway ? ?Additional Equipment: None ? ?Intra-op Plan:  ? ?Post-operative Plan:  ? ?Informed Consent: I have reviewed the patients History and Physical, chart, labs and discussed the procedure including the risks, benefits and alternatives for the proposed anesthesia with the patient or authorized representative who has indicated his/her understanding and acceptance.  ? ? ? ? ? ?Plan Discussed with: CRNA and Anesthesiologist ? ?Anesthesia Plan Comments:   ? ? ? ? ? ?Anesthesia Quick Evaluation ? ?

## 2021-11-25 NOTE — Addendum Note (Signed)
Addended by: Gillermina Hu on: 11/25/2021 03:52 PM ? ? Modules accepted: Orders ? ?

## 2021-11-25 NOTE — Telephone Encounter (Signed)
Referral for oncology Navigator placed in Epic ?

## 2021-11-25 NOTE — Op Note (Addendum)
[facility] ?Patient Name: [name] [name] ?Procedure Date: 11/25/2021 ?MRN: [id number] ?Attending MD: Jackquline Denmark , MD ?Date of Birth: Jul 30, 1946 ?CSN: [id number] ?Age: 76 ?Admit Type: Outpatient ?Procedure:                Colonoscopy ?Indications:              High risk colon cancer surveillance: Personal  ?                          history of colonic polyps ?Providers:                Jackquline Denmark, MD, Jaci Carrel, RN, Narda Rutherford  ?                          [place], Technician, April Carter CRNA ?Referring MD:              ?Medicines:                Monitored Anesthesia Care ?Complications:            No immediate complications. ?Estimated Blood Loss:     Estimated blood loss: none. ?Procedure:                Pre-Anesthesia Assessment: ?                          - Prior to the procedure, a History and Physical  ?                          was performed, and patient medications and  ?                          allergies were reviewed. The patient's tolerance of  ?                          previous anesthesia was also reviewed. The risks  ?                          and benefits of the procedure and the sedation  ?                          options and risks were discussed with the patient.  ?                          All questions were answered, and informed consent  ?                          was obtained. Prior Anticoagulants: Coumadin was  ?                          held 5 days prior. ASA Grade Assessment: III - A  ?                          patient with severe systemic disease. After  ?  reviewing the risks and benefits, the patient was  ?                          deemed in satisfactory condition to undergo the  ?                          procedure. ?                          After obtaining informed consent, the colonoscope  ?                          was passed under direct vision. Throughout the  ?                          procedure, the patient's blood pressure, pulse,  and  ?                          oxygen saturations were monitored continuously. The  ?                          PCF-HQ190L ([id number]) Olympus colonoscope was  ?                          introduced through the anus and advanced to the 2  ?                          cm into the ileum. The colonoscopy was performed  ?                          without difficulty. The patient tolerated the  ?                          procedure well. The quality of the bowel  ?                          preparation was adequate to identify polyps. The  ?                          terminal ileum, ileocecal valve, appendiceal  ?                          orifice, and rectum were photographed. ?Scope In: 11:05:39 AM ?Scope Out: 11:17:20 AM ?Scope Withdrawal Time: 0 hours 9 minutes 1 second  ?Total Procedure Duration: 0 hours 11 minutes 41 seconds  ?Findings: ?     Two sessile polyps were found in the cecum. The polyps were 4 to 6 mm in  ?     size. These polyps were removed with a cold snare. Resection and  ?     retrieval were complete. ?     Multiple small-mouthed diverticula were found in the sigmoid colon and  ?     descending colon. Few rare diverticula in the ascending colon. ?     Non-bleeding internal hemorrhoids were found during retroflexion. The  ?     hemorrhoids were small  and Grade I (internal hemorrhoids that do not  ?     prolapse). ?     The terminal ileum appeared normal. ?     The exam was otherwise without abnormality on direct and retroflexion  ?     views. ?Impression:               - Two 4 to 6 mm polyps in the cecum, removed with a  ?                          cold snare. Resected and retrieved. ?                          - Moderate predominantly left colonic  ?                          diverticulosis. ?                          - Non-bleeding internal hemorrhoids. ?                          - The examined portion of the ileum was normal. ?                          - The examination was otherwise normal on direct  ?                           and retroflexion views. ?Moderate Sedation: ?     Not Applicable - Patient had care per Anesthesia. ?Recommendation:           - Patient has a contact number available for  ?                          emergencies. The signs and symptoms of potential  ?                          delayed complications were discussed with the  ?                          patient. Return to normal activities tomorrow.  ?                          Written discharge instructions were provided to the  ?                          patient. ?                          - Resume previous diet. ?                          - Continue present medications. ?                          - Await pathology results. ?                          -  Repeat colonoscopy is not recommended for  ?                          surveillance. ?Procedure Code(s):        --- Professional --- ?                          [phone], Colonoscopy, flexible; with removal of  ?                          tumor(s), polyp(s), or other lesion(s) by snare  ?                          technique ?Diagnosis Code(s):        --- Professional --- ?                          Z86.010, Personal history of colonic polyps ?                          K63.5, Polyp of colon ?                          K64.0, First degree hemorrhoids ?                          K57.30, Diverticulosis of large intestine without  ?                          perforation or abscess without bleeding ?CPT copyright 2019 [employer] Association. All rights reserved. ?The codes documented in this report are preliminary and upon coder review may  ?be revised to meet current compliance requirements. ?Jackquline Denmark, MD ?11/25/2021 11:25:32 AM ?This report has been signed electronically. ?Number of Addenda: 0 ?

## 2021-11-25 NOTE — Discharge Instructions (Addendum)
YOU HAD AN ENDOSCOPIC PROCEDURE TODAY: Refer to the procedure report and other information in the discharge instructions given to you for any specific questions about what was found during the examination. If this information does not answer your questions, please call [facility] office at [phone] to clarify.  ° °YOU SHOULD EXPECT: Some feelings of bloating in the abdomen. Passage of more gas than usual. Walking can help get rid of the air that was put into your GI tract during the procedure and reduce the bloating. If you had a lower endoscopy (such as a colonoscopy or flexible sigmoidoscopy) you may notice spotting of blood in your stool or on the toilet paper. Some abdominal soreness may be present for a day or two, also. ° °DIET: Your first meal following the procedure should be a light meal and then it is ok to progress to your normal diet. A half-sandwich or bowl of soup is an example of a good first meal. Heavy or fried foods are harder to digest and may make you feel nauseous or bloated. Drink plenty of fluids but you should avoid alcoholic beverages for 24 hours. If you had a esophageal dilation, please see attached instructions for diet.   ° °ACTIVITY: Your care partner should take you home directly after the procedure. You should plan to take it easy, moving slowly for the rest of the day. You can resume normal activity the day after the procedure however YOU SHOULD NOT DRIVE, use power tools, machinery or perform tasks that involve climbing or major physical exertion for 24 hours (because of the sedation medicines used during the test).  ° °SYMPTOMS TO REPORT IMMEDIATELY: °A gastroenterologist can be reached at any hour. Please call [phone]  for any of the following symptoms:  °Following lower endoscopy (colonoscopy, flexible sigmoidoscopy) °Excessive amounts of blood in the stool  °Significant tenderness, worsening of abdominal pains  °Swelling of the abdomen that is new, acute  °Fever of 100° or  higher  °Following upper endoscopy (EGD, EUS, ERCP, esophageal dilation) °Vomiting of blood or coffee ground material  °New, significant abdominal pain  °New, significant chest pain or pain under the shoulder blades  °Painful or persistently difficult swallowing  °New shortness of breath  °Black, tarry-looking or red, bloody stools ° °FOLLOW UP:  °If any biopsies were taken you will be contacted by phone or by letter within the next 1-3 weeks. Call [phone]  if you have not heard about the biopsies in 3 weeks.  °Please also call with any specific questions about appointments or follow up tests. ° °

## 2021-11-25 NOTE — H&P (Signed)
?  ?  ?09/23/2021 ?[name] [name] ?[id number] ?Mar 13, 1946 ?  ?  ?HISTORY OF PRESENT ILLNESS: This is a 76 year old male who is a patient [name].  He is actually here today to discuss colonoscopy.  His last colonoscopy was in July 2015 as follows: ?  ?1. Moderate diverticulosis was noted throughout the entire examined colon ?2. The colon mucosa was otherwise normal - excellent prep - hx adenomas 2005 91) and 2010 (2) all small ?  ?Due to his history of adenomas repeat was recommended at 7 years.  He says that he moves his bowels well without any sign of bleeding rectal bleeding.  He is on Coumadin for history of apical thrombus in the setting of congestive heart failure with most recent EF of 29%.  Follows with Dr. [name].  Had to hold coumadin for 5 days back in November for a back injection--no issues with holding it at that time.  He does admit to some nosebleeds and bringing up some dried blood with phlegm in the mornings for the past couple of months.  No vomiting of blood and no black stools.  He does admit to about a 20 pound weight loss over the past several months.  He says that his appetite has been down.  He is not really sure why.  Is also concerned because he has had what he says is 8 family members who passed away from pancreatic cancer, most recently just within the past couple of weeks his brother passed away from this.  He says that he was told that he should be checked for it. ?  ?    ?Past Medical History:  ?Diagnosis Date  ? ALLERGIC RHINITIS 10/31/2007  ?  Qualifier: Diagnosis of  By: Jenny Reichmann MD, Hunt Oris   ? Allergy    ? CHOLECYSTECTOMY, HX OF 06/04/2007  ?  Qualifier: Diagnosis of  By: [place], [place]    ? COLONIC POLYPS, HX OF 10/31/2007  ?  Qualifier: Diagnosis of  By: Jenny Reichmann MD, Hunt Oris   ? CORONARY ARTERY BYPASS GRAFT, HX OF 06/04/2007  ?  Qualifier: Diagnosis of  By: [place], [place]    ? CORONARY ARTERY DISEASE 06/04/2007  ?  Qualifier: Diagnosis of  By: [place], [place]    ?  Diverticulosis    ? Erectile dysfunction    ? Eye twitch    ?  right eye since chilhood   ? HYPERLIPIDEMIA 06/04/2007  ?  Qualifier: Diagnosis of  By: [place], [place]    ? Hypersomnolence    ? HYPERTENSION 06/04/2007  ?  Qualifier: Diagnosis of  By: [place], [place]    ? Hypothyroidism    ? ISCHEMIC CARDIOMYOPATHY 06/04/2007  ?  Qualifier: Diagnosis of  By: [place], [place]    ? Myocardial infarction [facility])    ?  per patient , his occurred in 1998   ? Osteoarthritis    ? PLMD (periodic limb movement disorder)    ? Sleep apnea    ?  no cpap' per patient , "i was checked for it and they said i didnt have it "   ? Vertigo    ? Vitamin D deficiency    ?  ?     ?Past Surgical History:  ?Procedure Laterality Date  ? CHOLECYSTECTOMY      ? COLONOSCOPY      ? CORONARY ARTERY BYPASS GRAFT      ? ICD IMPLANT N/A 01/30/2020  ?  Procedure: ICD  IMPLANT;  Surgeon: Thompson Grayer, MD;  Location: [facility];  Service: Cardiovascular;  Laterality: N/A;  ? KNEE ARTHROPLASTY Left 08/05/2017  ?  Procedure: LEFT TOTAL KNEE ARTHROPLASTY WITH COMPUTER NAVIGATION;  Surgeon: Rod Can, MD;  Location: [facility];  Service: Orthopedics;  Laterality: Left;  Needs RNFA  ? PENILE PROSTHESIS IMPLANT      ? REPLACEMENT TOTAL KNEE   06/12/2013  ? spinal cyst      ? THORACOTOMY      ?  left anterior; wound exploration and debridement  ? TONSILLECTOMY AND ADENOIDECTOMY      ?  age 57  ?  ? reports that he quit smoking about 40 years ago. His smoking use included cigarettes. He has a 68.00 pack-year smoking history. He has never used smokeless tobacco. He reports current alcohol use. He reports that he does not use drugs. ?family history includes COPD in his son; Colon cancer in his maternal grandfather; Heart disease in his brother; Lung cancer in his father; Pancreatic cancer in his brother, cousin, and maternal uncle; Stomach cancer in his mother. ?     ?Allergies  ?Allergen Reactions  ? Ace Inhibitors Cough  ? Rosuvastatin Other  (See Comments)  ?    Muscle pains  ? Statins Other (See Comments)  ?    Muscle pains  ?  ?  ?  ?    ?Outpatient Encounter Medications as of 09/23/2021  ?Medication Sig  ? Ascorbic Acid (VITA-C PO) Take by mouth. '1000mg'$  daily  ? b complex vitamins capsule Take 1 capsule by mouth daily.  ? empagliflozin (JARDIANCE) 10 MG TABS tablet Take 1 tablet (10 mg total) by mouth daily before breakfast.  ? ezetimibe (ZETIA) 10 MG tablet TAKE 1 TABLET EVERY DAY  ? meloxicam (MOBIC) 15 MG tablet Take 15 mg by mouth daily.   ? metoprolol succinate (TOPROL-XL) 50 MG 24 hr tablet TAKE 1 TABLET DAILY. TAKE WITH OR IMMEDIATELY FOLLOWING A MEAL.  ? Multiple Vitamins-Minerals (MULTIVITAMIN WITH MINERALS) tablet Take 1 tablet by mouth daily. Centrum silver  ? rosuvastatin (CRESTOR) 5 MG tablet TAKE 1 TABLET EVERY OTHER DAY  ? triamcinolone (NASACORT) 55 MCG/ACT AERO nasal inhaler Place 2 sprays into the nose daily.  ? valsartan (DIOVAN) 320 MG tablet TAKE 1 TABLET DAILY.  ? VITAMIN D PO Take by mouth. '125mg'$  daily  ? warfarin (COUMADIN) 5 MG tablet TAKE 1 TABLET EVERY DAY AT 4 PM  ? furosemide (LASIX) 20 MG tablet Take 1 tablet (20 mg total) by mouth daily.  ? spironolactone (ALDACTONE) 25 MG tablet Take 0.5 tablets (12.5 mg total) by mouth daily.  ?  ?No facility-administered encounter medications on file as of 09/23/2021.  ?  ?  ?  ?REVIEW OF SYSTEMS  : All other systems reviewed and negative except where noted in the History of Present Illness. ?  ?  ?PHYSICAL EXAM: ?BP 124/70   Pulse (!) 50   Ht '5\' 11"'$  (1.803 m)   Wt 206 lb (93.4 kg)   BMI 28.73 kg/m?  ?General: Well developed white male in no acute distress ?Head: Normocephalic and atraumatic ?Eyes:  Sclerae anicteric, conjunctiva pink. ?Ears: Normal auditory acuity ?Lungs: Clear throughout to auscultation; no W/R/R. ?Heart: Regular rate and rhythm; no M/R/G. ?Abdomen: Soft, non-distended.  BS present.  Non-tender. ?Rectal:  Will be done at the time of coloscopy. ?Musculoskeletal:  Symmetrical with no gross deformities  ?Skin: No lesions on visible extremities ?Extremities: No edema  ?Neurological: Alert oriented  x 4, grossly non-focal ?Psychological:  Alert and cooperative. Normal mood and affect ?  ?ASSESSMENT AND PLAN: ?*Weight loss: Reports about a 20 pound weight loss over the past several months with associated decrease in appetite. ?*Family history of pancreatic cancer: He reports several family members with pancreatic cancer especially recently and a brother who just recently passed away from it.  He says that he was told that he "needs to be checked for it".  We will plan for CT scan of the abdomen and pelvis with contrast especially in light of his recent weight loss as well.  Will check BMP. ?*Personal history of colon polyps: Last colonoscopy in 2015 was without polyps, but due to history of adenomas in the past repeat was recommended in 7 years.  Could justify not proceeding due to his age, but with recent weight loss we will plan to proceed.  This will need to be done at [facility] due to low ejection fraction of 29%.  Will plan to schedule this when we have availability.  Await results from above CT scan as well. ?*Chronic anticoagulation on Coumadin for history of apical thrombus in the setting of ischemic cardiomyopathy/chronic systolic congestive heart failure and history of coronary artery disease.  Follows with Dr. [name].  Will hold coumadin for 5 days prior to endoscopic procedures - will instruct when and how to resume after procedure. Benefits and risks of procedure explained including risks of bleeding, perforation, infection, missed lesions, reactions to medications and possible need for hospitalization and surgery for complications. Additional rare but real risk of stroke or other vascular clotting events off of coumadin also explained and need to seek urgent help if any signs of these problems occur. Will communicate by phone or EMR with patient's  prescribing provider, Dr. [name], to confirm that holding coumadin is reasonable in this case. ? ? ? ? Attending physician's note  ? ?I have taken history, reviewed the chart and examined the patient. I perform

## 2021-11-26 ENCOUNTER — Encounter (HOSPITAL_COMMUNITY): Payer: Self-pay | Admitting: Gastroenterology

## 2021-11-27 LAB — SURGICAL PATHOLOGY

## 2021-12-02 ENCOUNTER — Other Ambulatory Visit (HOSPITAL_COMMUNITY)
Admission: RE | Admit: 2021-12-02 | Discharge: 2021-12-02 | Disposition: A | Discharge status: Home / Self Care | Payer: Medicare HMO | Source: Ambulatory Visit | Attending: Cardiology | Admitting: Cardiology

## 2021-12-02 DIAGNOSIS — N289 Disorder of kidney and ureter, unspecified: Secondary | ICD-10-CM | POA: Insufficient documentation

## 2021-12-02 LAB — BASIC METABOLIC PANEL
Anion gap: 6 (ref 5–15)
BUN: 26 mg/dL — ABNORMAL HIGH (ref 8–23)
CO2: 27 mmol/L (ref 22–32)
Calcium: 9.2 mg/dL (ref 8.9–10.3)
Chloride: 105 mmol/L (ref 98–111)
Creatinine, Ser: 1.6 mg/dL — ABNORMAL HIGH (ref 0.61–1.24)
GFR, Estimated: 44 mL/min — ABNORMAL LOW (ref >60)
Glucose, Bld: 120 mg/dL — ABNORMAL HIGH (ref 70–99)
Potassium: 4.7 mmol/L (ref 3.5–5.1)
Sodium: 138 mmol/L (ref 135–145)

## 2021-12-03 ENCOUNTER — Other Ambulatory Visit: Payer: Self-pay | Admitting: Student

## 2021-12-03 ENCOUNTER — Inpatient Hospital Stay (HOSPITAL_COMMUNITY): Payer: No Typology Code available for payment source

## 2021-12-03 ENCOUNTER — Inpatient Hospital Stay (HOSPITAL_COMMUNITY): Payer: No Typology Code available for payment source | Attending: Hematology | Admitting: Hematology

## 2021-12-03 VITALS — BP 130/70 | HR 66 | Temp 97.5°F | Resp 18 | Ht 70.0 in | Wt 207.4 lb

## 2021-12-03 DIAGNOSIS — I110 Hypertensive heart disease with heart failure: Secondary | ICD-10-CM | POA: Insufficient documentation

## 2021-12-03 DIAGNOSIS — I10 Essential (primary) hypertension: Secondary | ICD-10-CM

## 2021-12-03 DIAGNOSIS — C801 Malignant (primary) neoplasm, unspecified: Secondary | ICD-10-CM

## 2021-12-03 DIAGNOSIS — I509 Heart failure, unspecified: Secondary | ICD-10-CM | POA: Insufficient documentation

## 2021-12-03 DIAGNOSIS — Z87891 Personal history of nicotine dependence: Secondary | ICD-10-CM | POA: Insufficient documentation

## 2021-12-03 DIAGNOSIS — C8510 Unspecified B-cell lymphoma, unspecified site: Secondary | ICD-10-CM | POA: Insufficient documentation

## 2021-12-03 DIAGNOSIS — Z800 Family history of malignant neoplasm of digestive organs: Secondary | ICD-10-CM | POA: Insufficient documentation

## 2021-12-03 DIAGNOSIS — I255 Ischemic cardiomyopathy: Secondary | ICD-10-CM | POA: Diagnosis not present

## 2021-12-03 DIAGNOSIS — I2510 Atherosclerotic heart disease of native coronary artery without angina pectoris: Secondary | ICD-10-CM

## 2021-12-03 DIAGNOSIS — Z801 Family history of malignant neoplasm of trachea, bronchus and lung: Secondary | ICD-10-CM | POA: Insufficient documentation

## 2021-12-03 HISTORY — DX: Malignant (primary) neoplasm, unspecified: C80.1

## 2021-12-03 LAB — HEPATITIS B SURFACE ANTIBODY,QUALITATIVE: Hep B S Ab: NONREACTIVE

## 2021-12-03 LAB — LACTATE DEHYDROGENASE: LDH: 146 U/L (ref 98–192)

## 2021-12-03 LAB — URIC ACID: Uric Acid, Serum: 7.8 mg/dL (ref 3.7–8.6)

## 2021-12-03 LAB — HEPATITIS B SURFACE ANTIGEN: Hepatitis B Surface Ag: NONREACTIVE

## 2021-12-03 LAB — HEPATITIS C ANTIBODY: HCV Ab: NONREACTIVE

## 2021-12-03 LAB — HEPATITIS B CORE ANTIBODY, TOTAL: Hep B Core Total Ab: NONREACTIVE

## 2021-12-03 MED ORDER — PROCHLORPERAZINE MALEATE 10 MG PO TABS: 10.0000 mg | ORAL_TABLET | Freq: Four times a day (QID) | ORAL | 3 refills | Status: DC | PRN

## 2021-12-03 MED ORDER — PREDNISONE 20 MG PO TABS: ORAL_TABLET | ORAL | 0 refills | Status: DC

## 2021-12-03 NOTE — Patient Instructions (Addendum)
[place] ?Chemotherapy Teaching ? ? 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  You will be treated in the clinic every 3 weeks for 3 days in a row (for 3 cycles) with a combination of chemotherapy drugs, and a monoclonal antibody drug.  The intent of treatment is cure.  You will see the doctor regularly throughout treatment.  We will obtain blood work from you prior to every treatment and monitor your results to make sure it is safe to give your treatment. The doctor monitors your response to treatment by the way you are feeling, your blood work, and by obtaining scans periodically.  There will be wait times while you are here for treatment.  It will take about 30 minutes to 1 hour for your lab work to result.  Then there will be wait times while pharmacy mixes your medications.   ? ?R-CEOP is the acronym for the combination of chemotherapy drugs and other drugs you will receive for your treatment.  The drugs this regimen includes are:  Rituxan (rituximab); Cytoxan (cyclophosphamide); Oncovin (vincristine); etoposide; and prednisone (not a chemo drug). ? ?Medications you will receive in the clinic prior to your chemotherapy medications: ? ?Aloxi:  ALOXI is used in adults to help prevent nausea and vomiting that happens with certain chemotherapy drugs.  Aloxi is a long acting medication, and will remain in your system for about two days.  ? ?Dexamethasone:  This is a steroid given prior to chemotherapy to help prevent allergic reactions; it may also help prevent and control nausea and diarrhea.  ? ?Tylenol:  Given to prevent infusion reactions to rituximab. ? ?Benadryl:  Antihistamine given to prevent allergic/infusion reactions to rituximab.  ? ? ?Vincristine sulfate (Generic Name) ?Other Names: Oncovin?, Vincasar?, VCR ? ?About This Drug ? ?Vincristine is a drug used to treat cancer. It is given in the vein (IV).  This drug will take 10 minutes to infuse. ? ? ?Possible Side  Effects (More Common) ? ? Constipation (not able to move bowels) ? ? Hair loss. You may notice hair getting thin. Some patients lose their hair. Your hair often grows back when treatment is done. Most often hair loss is temporary; your hair should grow back when treatment is done. ? ? Effects on the nerves are called peripheral neuropathy. You may feel numbness, tingling, or pain in your hands and feet. It may be hard for you to button your clothes, open jars, or walk as usual. The effect on the nerves may get worse with more doses of the drug. These effects get better in some people after the drug is stopped but it does not get better in all people. ? ? ?Possible Side Effects (Less Common) ? ? Soreness of the mouth and throat. You may have red areas, white patches, or sores that hurt. ? ? Swelling of your legs, ankles, and/or feet ? ? Skin and tissue irritation may involve redness, pain, warmth, or swelling at the IV site. This happens if the drug leaks out of the vein and into nearby tissue. ? ? Blood pressure changes. Some patients have low blood pressure and some patients have high blood pressure. ? ? Jaw pain ? ? Hoarseness ? ? Blurred or double vision or other changes in eyesight may happen but are a rare side effect. ? ? Feeling dizzy or lightheaded ? ? Drooping eyelids are a rare side effect. ? ? Depression ? ? Rash ? ? Bone marrow depression. This  is a decrease in the number of white blood cells, red blood cells, and platelets. This may raise your risk of infection, make you tired and weak (fatigue), and raise your ?risk of bleeding. ? ? Chest pain or symptoms of a heart attack. Most heart attacks involve pain in the center of the chest that lasts more than a few minutes. The pain may go away and come back or it can be constant. It can feel like pressure, squeezing, fullness, or pain. Sometimes pain is felt in one or both arms, the back, neck, jaw, or stomach. If any of these symptoms last 2 minutes, call  911. ? ? Trouble sleeping ? ? Lack of appetite; weight loss ? ? ?Allergic Reactions ? ?Serious allergic reactions including anaphylaxis are rare. While you are getting this drug in your vein (IV), tell your nurse right away if you have any of these symptoms of an allergic reaction: ? ? Trouble catching your breath ? ? Feeling like your tongue or throat are swelling ? ? Feeling your heart beat quickly or in a not normal way (palpitations) ? ? Feeling dizzy or lightheaded ? ? Flushing, itching, rash, and/or hives ? ? ?Treating Side Effects ? ? While you are getting this drug in your IV, please tell your nurse right away if you have any pain, redness, or swelling at the site of the IV infusion. ? ? Ask your doctor or nurse how to prevent or lessen constipation. Constipation can become a serious side effect. If you are constipated, check with your doctor or nurse before you use enemas, laxatives, or suppositories. ? ? Drink 6-8 cups of fluids each day unless your doctor has told you to limit your fluid intake due to some other health problem. A cup is 8 ounces of fluid. If you throw up or have loose bowel movements, you should drink more fluids so that you do not become dehydrated (lack water in the body from losing too much fluid). ? ? Mouth care is very important. Your mouth care should consist of gently cleaning your teeth or dentures and rinsing your mouth with a mixture of ? teaspoon salt in 8 ounces of water or ? teaspoon sodium bicarbonate (baking soda) in 8 ounces of water. This should be done at least after each meal and at bedtime. ? ? If you have mouth sores, avoid mouthwash that has alcohol. Also avoid alcohol and smoking because they can bother your mouth and throat. ? ? If you get a rash do not put anything on it unless your doctor or nurse says you may. Keep the area around the rash clean and dry. Ask your doctor for medicine if your rash bothers you. ? ? If you have numbness and tingling in your hands  and feet, be careful when cooking, walking, and handling sharp objects and hot liquids. ? ? Talk with your nurse about getting a wig before you lose your hair. Also, call the [facility] at 800-ACS-2345 to find out information about the ?Look Good, Feel Better? program close to where you live. It is a free program where women getting chemotherapy can learn about wigs, turbans and scarves as well as makeup techniques and skin and nail care. ? ? ?Food and Drug Interactions ? ? There are known interactions of Vincristine with grapefruit. Do not eat grapefruit or drink grapefruit juice while taking this drug. ? ? Check with your doctor or pharmacist about all other prescription medicines and dietary supplements you  are taking before starting this medicine as there are a lot of known drug interactions with Vincristine. Also, check with your doctor or pharmacist before starting any new prescription, over-the-counter medicines, or dietary supplement to make sure that there are no interactions. ? ? ?When to Call the Doctor ? ?Call your doctor or nurse right away if you have any of these symptoms: ? ? Redness, pain, warmth, or swelling at the IV site while you are getting this drug ? ? No bowel movement in 3 days or when you feel uncomfortable. ? ? Abdominal pain ? ? Fever of 100.4 F (38 C) or higher ? ? Chills ? ? Feeling short of breath ? ? Easy bleeding or bruising ? ? Jaw pain ? ? Hoarseness ? ? Drooping eyelids ? ? Blurred vision or other changes in eyesight ? ? Feeling confused, restless, or irritable ? ? Seizures (Common symptoms of a seizure can include confusion, blacking out, passing out, loss of hearing or vision, blurred vision, unusual smells or tastes (such as burning rubber), trouble talking, tremors or shaking in parts or all of the body, repeated body movements, tense muscles that do not relax, and loss of control of urine and bowels. There are other less common symptoms of seizures. If you or  your family member suspects you are having a seizure, call 911 right away). ? ? Hallucinations (seeing, hearing, or feeling things that are not really there) ? ? Feeling ? ? Rash ? ? Chest pain or symptoms of

## 2021-12-03 NOTE — Patient Instructions (Addendum)
[place] at [facility] ?Discharge Instructions ? ?You were seen and examined today by Dr. Delton Coombes. Dr. Delton Coombes is a medical oncologist, meaning that he specializes in the treatment of cancer diagnoses. Dr. Delton Coombes discussed your past medical history, family history of cancers, and the events that led to you being here today. ? ?You were referred to Dr. Delton Coombes due to high-grade B cell Lymphoma, this is a fast growing cancer. This will need treatment with a chemotherapy regimen. It is imperative that we start treatment as soon as possible. ? ?Dr. Delton Coombes has recommended a PET scan, an echocardiogram and a Port-A-Cath placement. A PET scan is a specialized CT scan that illuminates where there is cancer present in your body. The echocardiogram is to establish baseline heart function prior to starting chemotherapy. The Port-A-Cath will be the device used to administer chemotherapy. ? ?Chemotherapy will be a combination regimen, known as R-CHOP. This is given once every 21 days here in the clinic. Prior to starting chemotherapy, you will meet with our chemotherapy educator to discuss the medications in detail and what a treatment day will look like for you. ? ?Follow-up as scheduled. ? ? ?Thank you for choosing [place] at [facility] to provide your oncology and hematology care.  To afford each patient quality time with our provider, please arrive at least 15 minutes before your scheduled appointment time.  ? ?If you have a lab appointment with the [facility] please come in thru the Main Entrance and check in at the main information desk. ? ?You need to re-schedule your appointment should you arrive 10 or more minutes late.  We strive to give you quality time with our providers, and arriving late affects you and other patients whose appointments are after yours.  Also, if you no show three or more times for appointments you may be dismissed from the  clinic at the providers discretion.     ?Again, thank you for choosing [facility].  Our hope is that these requests will decrease the amount of time that you wait before being seen by our physicians.       ?_____________________________________________________________ ? ?Should you have questions after your visit to [facility], please contact our office at [phone] and follow the prompts.  Our office hours are 8:00 a.m. and 4:30 p.m. Monday - Friday.  Please note that voicemails left after 4:00 p.m. may not be returned until the following business day.  We are closed weekends and major holidays.  You do have access to a nurse 24-7, just call the main number to the clinic [phone] and do not press any options, hold on the line and a nurse will answer the phone.   ? ?For prescription refill requests, have your pharmacy contact our office and allow 72 hours.   ? ?Due to Covid, you will need to wear a mask upon entering the hospital. If you do not have a mask, a mask will be given to you at the Main Entrance upon arrival. For doctor visits, patients may have 1 support person age 86 or older with them. For treatment visits, patients can not have anyone with them due to social distancing guidelines and our immunocompromised population.  ? ? ? ?

## 2021-12-03 NOTE — Progress Notes (Signed)
? ?[place] ?[address] ?[place], [place] [address] ? ? ?CLINIC:  ?Medical Oncology/Hematology ? ?CONSULT NOTE ? ?Patient Care Team: ?Biagio Borg, MD as PCP - General ?[name] Denice Bors, MD as PCP - Cardiology (Cardiology) ?Derek Jack, MD as Medical Oncologist (Medical Oncology) ?Brien Mates, RN as Oncology Nurse Navigator (Medical Oncology) ? ?CHIEF COMPLAINTS/PURPOSE OF CONSULTATION:  ?Evaluation of high-grade B-cell lymphoma ? ?HISTORY OF PRESENTING ILLNESS:  ?[name] [name] 76 y.o. male is here because of evaluation of high-grade B-cell lymphoma, at the request of the [place]. ? ?Today he reports feeling good. He reports he has lost 20 lbs since September 2022, and he reports poor appetite. He denies evening fevers and night sweats. He reports right sided abdominal pain with movement. He reports numbness in his right foot, and he denies associated burning or pain. He reports early satiety. He denies vomiting and nausea. He reports a MI in 1998 and reports a history of congestive heart failure. He  reports he is able to do his typical daily activities at home. He denies vision changes, headaches, and increased leg weakness.  ? ?He served in [employer], and he denies chemical exposure. He quit smoking 40 years ago. He reports drinking 1 beer a day. His brother passed from pancreatic cancer at 77 years old, and 2 maternal cousins, a maternal uncle, and 2 maternal nieces also had pancreatic cancer.  ? ?MEDICAL HISTORY:  ?Past Medical History:  ?Diagnosis Date  ? ALLERGIC RHINITIS 10/31/2007  ? Qualifier: Diagnosis of  By: Jenny Reichmann MD, Hunt Oris   ? Allergy   ? CHOLECYSTECTOMY, HX OF 06/04/2007  ? Qualifier: Diagnosis of  By: [place], [place]    ? COLONIC POLYPS, HX OF 10/31/2007  ? Qualifier: Diagnosis of  By: Jenny Reichmann MD, Hunt Oris   ? CORONARY ARTERY BYPASS GRAFT, HX OF 06/04/2007  ? Qualifier: Diagnosis of  By: [place], [place]    ? CORONARY ARTERY DISEASE 06/04/2007  ? Qualifier:  Diagnosis of  By: [place], [place]    ? Diverticulosis   ? Erectile dysfunction   ? Eye twitch   ? right eye since chilhood   ? HYPERLIPIDEMIA 06/04/2007  ? Qualifier: Diagnosis of  By: [place], [place]    ? Hypersomnolence   ? HYPERTENSION 06/04/2007  ? Qualifier: Diagnosis of  By: [place], [place]    ? Hypothyroidism   ? ISCHEMIC CARDIOMYOPATHY 06/04/2007  ? Qualifier: Diagnosis of  By: [place], [place]    ? Myocardial infarction [facility])   ? per patient , his occurred in 1998   ? Osteoarthritis   ? PLMD (periodic limb movement disorder)   ? Sleep apnea   ? no cpap' per patient , "i was checked for it and they said i didnt have it "   ? Vertigo   ? Vitamin D deficiency   ? ? ?SURGICAL HISTORY: ?Past Surgical History:  ?Procedure Laterality Date  ? BIOPSY  11/25/2021  ? Procedure: BIOPSY;  Surgeon: Jackquline Denmark, MD;  Location: [facility];  Service: Gastroenterology;;  ? CHOLECYSTECTOMY    ? COLONOSCOPY    ? COLONOSCOPY WITH PROPOFOL N/A 11/25/2021  ? Procedure: COLONOSCOPY WITH PROPOFOL;  Surgeon: Jackquline Denmark, MD;  Location: [facility];  Service: Gastroenterology;  Laterality: N/A;  ? CORONARY ARTERY BYPASS GRAFT    ? ESOPHAGOGASTRODUODENOSCOPY (EGD) WITH PROPOFOL N/A 11/25/2021  ? Procedure: ESOPHAGOGASTRODUODENOSCOPY (EGD) WITH PROPOFOL;  Surgeon: Jackquline Denmark, MD;  Location: [facility];  Service: Gastroenterology;  Laterality: N/A;  ? ICD IMPLANT N/A 01/30/2020  ? Procedure: ICD IMPLANT;  Surgeon: Thompson Grayer, MD;  Location: [facility];  Service: Cardiovascular;  Laterality: N/A;  ? KNEE ARTHROPLASTY Left 08/05/2017  ? Procedure: LEFT TOTAL KNEE ARTHROPLASTY WITH COMPUTER NAVIGATION;  Surgeon: Rod Can, MD;  Location: [facility];  Service: Orthopedics;  Laterality: Left;  Needs RNFA  ? PENILE PROSTHESIS IMPLANT    ? POLYPECTOMY  11/25/2021  ? Procedure: POLYPECTOMY;  Surgeon: Jackquline Denmark, MD;  Location: [facility];  Service: Gastroenterology;;  ? REPLACEMENT TOTAL KNEE  06/12/2013   ? spinal cyst    ? THORACOTOMY    ? left anterior; wound exploration and debridement  ? TONSILLECTOMY AND ADENOIDECTOMY    ? age 83  ? ? ?SOCIAL HISTORY: ?Social History  ? ?Socioeconomic History  ? Marital status: Married  ?  Spouse name: [name]  ? Number of children: 3  ? Years of education: Not on file  ? Highest education level: Not on file  ?Occupational History  ? Occupation: truck Geophysicist/field seismologist  ?  Employer: [employer]  ?Tobacco Use  ? Smoking status: Former  ?  Packs/day: 4.00  ?  Years: 17.00  ?  Pack years: 68.00  ?  Types: Cigarettes  ?  Quit date: 05/07/1981  ?  Years since quitting: 40.6  ? Smokeless tobacco: Never  ? Tobacco comments:  ?  Pt states that he would let most of them "burn" pt states that he used anywhere between 4-5PPD  ?Vaping Use  ? Vaping Use: Never used  ?Substance and Sexual Activity  ? Alcohol use: Yes  ?  Alcohol/week: 0.0 standard drinks  ?  Comment: rare  ? Drug use: No  ? Sexual activity: Not on file  ?Other Topics Concern  ? Not on file  ?Social History Narrative  ? Lives in [place] with spouse  ? Retired Administrator  ? ?Social Determinants of Health  ? ?Financial Resource Strain: Low Risk   ? Difficulty of Paying Living Expenses: Not hard at all  ?Food Insecurity: No Food Insecurity  ? Worried About Charity fundraiser in the Last Year: Never true  ? Ran Out of Food in the Last Year: Never true  ?Transportation Needs: No Transportation Needs  ? Lack of Transportation (Medical): No  ? Lack of Transportation (Non-Medical): No  ?Physical Activity: Inactive  ? Days of Exercise per Week: 0 days  ? Minutes of Exercise per Session: 0 min  ?Stress: No Stress Concern Present  ? Feeling of Stress : Not at all  ?Social Connections: Moderately Isolated  ? Frequency of Communication with Friends and Family: More than three times a week  ? Frequency of Social Gatherings with Friends and Family: More than three times a week  ? Attends Religious Services: Never  ? Active Member of Clubs or  Organizations: No  ? Attends Archivist Meetings: Never  ? Marital Status: Married  ?Intimate Partner Violence: Not At Risk  ? Fear of Current or Ex-Partner: No  ? Emotionally Abused: No  ? Physically Abused: No  ? Sexually Abused: No  ? ? ?FAMILY HISTORY: ?Family History  ?Problem Relation Age of Onset  ? Stomach cancer Mother   ?     smokes  ? Lung cancer Father   ?     chewed tobacco  ? Heart disease Brother   ?     first MI at 14yo, now 79 for  transplant list/ ICM  ? Pancreatic cancer Brother   ? Colon cancer Maternal Grandfather   ? COPD Son   ?     was a smoker  ? Pancreatic cancer Maternal Uncle   ? Pancreatic cancer Cousin   ?     mat side x 4  ? Esophageal cancer Neg Hx   ? Prostate cancer Neg Hx   ? Rectal cancer Neg Hx   ? ? ?ALLERGIES:  ?is allergic to ace inhibitors and statins. ? ?MEDICATIONS:  ?Current Outpatient Medications  ?Medication Sig Dispense Refill  ? ARTIFICIAL TEAR SOLUTION OP Place 1 drop into both eyes daily as needed (dry eyes).    ? Ascorbic Acid (VITA-C PO) Take 1,000 mg by mouth daily.    ? b complex vitamins capsule Take 1 capsule by mouth daily.    ? empagliflozin (JARDIANCE) 10 MG TABS tablet Take 1 tablet (10 mg total) by mouth daily before breakfast. 30 tablet 11  ? ezetimibe (ZETIA) 10 MG tablet TAKE 1 TABLET EVERY DAY (Patient taking differently: Take 10 mg by mouth every other day.) 90 tablet 3  ? furosemide (LASIX) 20 MG tablet Take 20 mg by mouth daily.    ? metoprolol succinate (TOPROL-XL) 50 MG 24 hr tablet TAKE 1 TABLET DAILY. TAKE WITH OR IMMEDIATELY FOLLOWING A MEAL. (Patient taking differently: Take 50 mg by mouth daily.) 90 tablet 1  ? Multiple Vitamins-Minerals (MULTIVITAMIN WITH MINERALS) tablet Take 1 tablet by mouth daily. Centrum silver    ? pantoprazole (PROTONIX) 40 MG tablet Take 1 tablet (40 mg total) by mouth daily. 90 tablet 3  ? rosuvastatin (CRESTOR) 5 MG tablet TAKE 1 TABLET EVERY OTHER DAY (Patient taking differently: Take 5 mg by mouth every  other day.) 45 tablet 10  ? triamcinolone (NASACORT) 55 MCG/ACT AERO nasal inhaler Place 2 sprays into the nose daily. (Patient taking differently: Place 2 sprays into the nose daily as needed (allergie

## 2021-12-04 ENCOUNTER — Ambulatory Visit (HOSPITAL_COMMUNITY)
Admission: RE | Admit: 2021-12-04 | Discharge: 2021-12-04 | Disposition: A | Discharge status: Home / Self Care | Payer: No Typology Code available for payment source | Source: Ambulatory Visit | Attending: Hematology | Admitting: Hematology

## 2021-12-04 ENCOUNTER — Other Ambulatory Visit: Payer: Self-pay

## 2021-12-04 ENCOUNTER — Ambulatory Visit (INDEPENDENT_AMBULATORY_CARE_PROVIDER_SITE_OTHER): Payer: Medicare HMO | Admitting: *Deleted

## 2021-12-04 ENCOUNTER — Encounter (HOSPITAL_COMMUNITY): Payer: Self-pay

## 2021-12-04 DIAGNOSIS — I2510 Atherosclerotic heart disease of native coronary artery without angina pectoris: Secondary | ICD-10-CM | POA: Diagnosis not present

## 2021-12-04 DIAGNOSIS — I252 Old myocardial infarction: Secondary | ICD-10-CM | POA: Insufficient documentation

## 2021-12-04 DIAGNOSIS — Z951 Presence of aortocoronary bypass graft: Secondary | ICD-10-CM | POA: Insufficient documentation

## 2021-12-04 DIAGNOSIS — E039 Hypothyroidism, unspecified: Secondary | ICD-10-CM | POA: Insufficient documentation

## 2021-12-04 DIAGNOSIS — I240 Acute coronary thrombosis not resulting in myocardial infarction: Secondary | ICD-10-CM

## 2021-12-04 DIAGNOSIS — C8510 Unspecified B-cell lymphoma, unspecified site: Secondary | ICD-10-CM

## 2021-12-04 DIAGNOSIS — I110 Hypertensive heart disease with heart failure: Secondary | ICD-10-CM | POA: Diagnosis not present

## 2021-12-04 DIAGNOSIS — I509 Heart failure, unspecified: Secondary | ICD-10-CM | POA: Diagnosis not present

## 2021-12-04 DIAGNOSIS — E559 Vitamin D deficiency, unspecified: Secondary | ICD-10-CM | POA: Diagnosis not present

## 2021-12-04 DIAGNOSIS — I255 Ischemic cardiomyopathy: Secondary | ICD-10-CM | POA: Diagnosis not present

## 2021-12-04 DIAGNOSIS — Z5181 Encounter for therapeutic drug level monitoring: Secondary | ICD-10-CM | POA: Diagnosis not present

## 2021-12-04 DIAGNOSIS — Z7901 Long term (current) use of anticoagulants: Secondary | ICD-10-CM | POA: Insufficient documentation

## 2021-12-04 DIAGNOSIS — Z95810 Presence of automatic (implantable) cardiac defibrillator: Secondary | ICD-10-CM | POA: Insufficient documentation

## 2021-12-04 DIAGNOSIS — E785 Hyperlipidemia, unspecified: Secondary | ICD-10-CM | POA: Diagnosis not present

## 2021-12-04 HISTORY — PX: IR IMAGING GUIDED PORT INSERTION: IMG5740

## 2021-12-04 LAB — POCT INR: INR: 1.6 — AB (ref 2.0–3.0)

## 2021-12-04 MED ORDER — LIDOCAINE-EPINEPHRINE 1 %-1:100000 IJ SOLN: INTRAMUSCULAR | Status: AC | PRN

## 2021-12-04 MED ORDER — LIDOCAINE-EPINEPHRINE 1 %-1:100000 IJ SOLN
INTRAMUSCULAR | Status: AC
  Filled 2021-12-04: qty 1

## 2021-12-04 MED ORDER — SODIUM CHLORIDE 0.9 % IV SOLN: INTRAVENOUS | Status: DC

## 2021-12-04 MED ORDER — MIDAZOLAM HCL 2 MG/2ML IJ SOLN
INTRAMUSCULAR | Status: AC | PRN
  Administered 2021-12-04: 1 mg via INTRAVENOUS

## 2021-12-04 MED ORDER — FENTANYL CITRATE (PF) 100 MCG/2ML IJ SOLN
INTRAMUSCULAR | Status: AC | PRN
  Administered 2021-12-04: 50 ug via INTRAVENOUS

## 2021-12-04 MED ORDER — FENTANYL CITRATE (PF) 100 MCG/2ML IJ SOLN
INTRAMUSCULAR | Status: AC | PRN
  Administered 2021-12-04: 25 ug via INTRAVENOUS

## 2021-12-04 MED ORDER — LIDOCAINE HCL 1 % IJ SOLN
INTRAMUSCULAR | Status: AC
  Administered 2021-12-04: 20 mL
  Filled 2021-12-04: qty 20

## 2021-12-04 MED ORDER — HEPARIN SOD (PORK) LOCK FLUSH 100 UNIT/ML IV SOLN
INTRAVENOUS | Status: AC
  Filled 2021-12-04: qty 5

## 2021-12-04 MED ORDER — HEPARIN SOD (PORK) LOCK FLUSH 100 UNIT/ML IV SOLN
INTRAVENOUS | Status: AC | PRN
  Administered 2021-12-04: 500 [IU] via INTRAVENOUS

## 2021-12-04 MED ORDER — MIDAZOLAM HCL 2 MG/2ML IJ SOLN
INTRAMUSCULAR | Status: AC
  Filled 2021-12-04: qty 2

## 2021-12-04 MED ORDER — FENTANYL CITRATE (PF) 100 MCG/2ML IJ SOLN
INTRAMUSCULAR | Status: AC
  Filled 2021-12-04: qty 2

## 2021-12-04 NOTE — Patient Instructions (Signed)
Dx with stomach Cancer.  Will be starting chemo 12/15/21 ?Was off warfarin 5 days for colonoscopy. ?Take warfarin 1 1/2 tablets tonight, 1 tablet tomorrow then resume 1 tablet daily except 1/2 tablet on Fridays ?Recheck on 12/18/21 ?

## 2021-12-04 NOTE — Procedures (Signed)
Interventional Radiology Procedure Note  Procedure: Placement of a right IJ approach single lumen PowerPort.  Tip is positioned at the superior cavoatrial junction and catheter is ready for immediate use.  Complications: None Recommendations:  - Ok to shower tomorrow - Do not submerge for 7 days - Routine line care   Signed,  Kaylor Simenson S. Garett Tetzloff, DO   

## 2021-12-04 NOTE — Discharge Instructions (Signed)
Please call Interventional Radiology clinic [phone] with any questions or concerns. ?

## 2021-12-04 NOTE — H&P (Signed)
? ? ?Referring Physician(s): ?Katragadda,Sreedhar ? ?Supervising Physician: Rolla Plate ? ?Patient Status:  [facility] ? ?Chief Complaint: ?"I'm getting a port a cath" ? ? ?Subjective: ?Pt known to IR service from right abd ST lesion bx on 11/17/21. He has a hx of recently diagnosed high grade B cell lymphoma and is scheduled today for port a cath placement to assist with treatment. Additional PMH as listed below. He denies fever,HA,CP,dyspnea, cough, abd/back pain,N/V or bleeding.  Past history of dizziness. ? ?Past Medical History:  ?Diagnosis Date  ? ALLERGIC RHINITIS 10/31/2007  ? Qualifier: Diagnosis of  By: Jenny Reichmann MD, Hunt Oris   ? Allergy   ? CHOLECYSTECTOMY, HX OF 06/04/2007  ? Qualifier: Diagnosis of  By: [place], [place]    ? COLONIC POLYPS, HX OF 10/31/2007  ? Qualifier: Diagnosis of  By: Jenny Reichmann MD, Hunt Oris   ? CORONARY ARTERY BYPASS GRAFT, HX OF 06/04/2007  ? Qualifier: Diagnosis of  By: [place], [place]    ? CORONARY ARTERY DISEASE 06/04/2007  ? Qualifier: Diagnosis of  By: [place], [place]    ? Diverticulosis   ? Erectile dysfunction   ? Eye twitch   ? right eye since chilhood   ? HYPERLIPIDEMIA 06/04/2007  ? Qualifier: Diagnosis of  By: [place], [place]    ? Hypersomnolence   ? HYPERTENSION 06/04/2007  ? Qualifier: Diagnosis of  By: [place], [place]    ? Hypothyroidism   ? ISCHEMIC CARDIOMYOPATHY 06/04/2007  ? Qualifier: Diagnosis of  By: [place], [place]    ? Myocardial infarction [facility])   ? per patient , his occurred in 1998   ? Osteoarthritis   ? PLMD (periodic limb movement disorder)   ? Sleep apnea   ? no cpap' per patient , "i was checked for it and they said i didnt have it "   ? Vertigo   ? Vitamin D deficiency   ? ?Past Surgical History:  ?Procedure Laterality Date  ? BIOPSY  11/25/2021  ? Procedure: BIOPSY;  Surgeon: Jackquline Denmark, MD;  Location: [facility];  Service: Gastroenterology;;  ? CHOLECYSTECTOMY    ? COLONOSCOPY    ? COLONOSCOPY WITH PROPOFOL N/A 11/25/2021  ? Procedure:  COLONOSCOPY WITH PROPOFOL;  Surgeon: Jackquline Denmark, MD;  [facility];  Service: Gastroenterology;  Laterality: N/A;  ? CORONARY ARTERY BYPASS GRAFT    ? ESOPHAGOGASTRODUODENOSCOPY (EGD) WITH PROPOFOL N/A 11/25/2021  ? Procedure: ESOPHAGOGASTRODUODENOSCOPY (EGD) WITH PROPOFOL;  Surgeon: Jackquline Denmark, MD;  [facility];  Service: Gastroenterology;  Laterality: N/A;  ? ICD IMPLANT N/A 01/30/2020  ? Procedure: ICD IMPLANT;  Surgeon: Thompson Grayer, MD;  Location: [place] [facility];  Service: Cardiovascular;  Laterality: N/A;  ? KNEE ARTHROPLASTY Left 08/05/2017  ? Procedure: LEFT TOTAL KNEE ARTHROPLASTY WITH COMPUTER NAVIGATION;  Surgeon: Rod Can, MD;  [facility];  Service: Orthopedics;  Laterality: Left;  Needs RNFA  ? PENILE PROSTHESIS IMPLANT    ? POLYPECTOMY  11/25/2021  ? Procedure: POLYPECTOMY;  Surgeon: Jackquline Denmark, MD;  Location: [facility];  Service: Gastroenterology;;  ? REPLACEMENT TOTAL KNEE  06/12/2013  ? spinal cyst    ? THORACOTOMY    ? left anterior; wound exploration and debridement  ? TONSILLECTOMY AND ADENOIDECTOMY    ? age 76  ? ? ? ? ?Allergies: ?Ace inhibitors and Statins ? ?Medications: ?Prior to Admission medications   ?Medication Sig Start Date End Date Taking? Authorizing Provider  ?ARTIFICIAL TEAR SOLUTION OP Place 1 drop into both  eyes daily as needed (dry eyes).   Yes [provider]  ?Ascorbic Acid (VITA-C PO) Take 1,000 mg by mouth daily.   Yes [provider]  ?b complex vitamins capsule Take 1 capsule by mouth daily.   Yes [provider]  ?empagliflozin (JARDIANCE) 10 MG TABS tablet Take 1 tablet (10 mg total) by mouth daily before breakfast. 05/08/21  Yes Crenshaw, Denice Bors, MD  ?ezetimibe (ZETIA) 10 MG tablet TAKE 1 TABLET EVERY DAY ?Patient taking differently: Take by mouth every other day. 07/21/21  Yes [name], MD  ?furosemide (LASIX) 20 MG tablet Take 20 mg by mouth daily.   Yes [provider]   ?metoprolol succinate (TOPROL-XL) 50 MG 24 hr tablet TAKE 1 TABLET DAILY. TAKE WITH OR IMMEDIATELY FOLLOWING A MEAL. ?Patient taking differently: Take 50 mg by mouth daily. 05/28/21  Yes [name], MD  ?Multiple Vitamins-Minerals (MULTIVITAMIN WITH MINERALS) tablet Take 1 tablet by mouth daily. Centrum silver   Yes [provider]  ?pantoprazole (PROTONIX) 40 MG tablet Take 1 tablet (40 mg total) by mouth daily. 11/25/21 11/25/22 Yes Jackquline Denmark, MD  ?rosuvastatin (CRESTOR) 5 MG tablet TAKE 1 TABLET EVERY OTHER DAY ?Patient taking differently: Take 5 mg by mouth every other day. 07/09/21  Yes [name], MD  ?triamcinolone (NASACORT) 55 MCG/ACT AERO nasal inhaler Place 2 sprays into the nose daily. ?Patient taking differently: Place 2 sprays into the nose daily as needed (allergies). 03/22/19  Yes Biagio Borg, MD  ?valsartan (DIOVAN) 160 MG tablet Take 1 tablet (160 mg total) by mouth daily. 11/06/21  Yes [name], MD  ?VITAMIN D PO Take 1,000 Units by mouth daily.   Yes [provider]  ?predniSONE (DELTASONE) 20 MG tablet Take 5 tablets (100 mg) by mouth daily starting on the first day each treatment and the four days following. DO NOT take on weeks you are not receiving treatment 12/03/21   Derek Jack, MD  ?prochlorperazine (COMPAZINE) 10 MG tablet Take 1 tablet (10 mg total) by mouth every 6 (six) hours as needed for nausea or vomiting. 12/03/21   Derek Jack, MD  ?spironolactone (ALDACTONE) 25 MG tablet Take 0.5 tablets (12.5 mg total) by mouth daily. 01/14/21 11/18/21  [name], MD  ?warfarin (COUMADIN) 5 MG tablet TAKE 1 TABLET EVERY DAY AT 4 PM ?Patient taking differently: Take 2.5-5 mg by mouth See admin instructions. Taking 5 mg every day except on Friday taking 1/2 tablet = 2.5 mg 05/29/21   Biagio Borg, MD  ? ? ? ?Vital Signs: ?BP 128/69 (BP Location: Right Arm)   Pulse (!) 58   Temp 97.7 ?F (36.5 ?C) (Oral)   Resp 18   Ht '5\' 10"'$   (1.778 m)   Wt 207 lb 6.4 oz (94.1 kg)   SpO2 98%   BMI 29.76 kg/m?  ? ?Physical Exam: awake/alert; chest- CTA bilat; heart- paced rhythm with occ ectopy; left chest wall AICD; abd- soft,+BS,NT; pretibial edema noted ? ?Imaging: ?No results found. ? ?Labs: ? ?CBC: ?Recent Labs  ?  12/18/20 ?0901 10/09/21 ?1107 11/17/21 ?0830  ?WBC 6.8 8.2 6.7  ?HGB 15.0 14.5 14.4  ?HCT 44.2 43.8 44.9  ?PLT 122* 174.0 143*  ? ? ?COAGS: ?Recent Labs  ?  11/11/21 ?2694 11/17/21 ?0830 11/25/21 ?1016 12/04/21 ?0918  ?INR 2.5 1.3* 1.1 1.6*  ? ? ?BMP: ?Recent Labs  ?  05/21/21 ?0911 10/09/21 ?1107 10/24/21 ?0930 11/13/21 ?1533 12/02/21 ?1201  ?NA  136 137 140 137 138  ?K 4.9 4.3 4.9 4.2 4.7  ?CL 103 99 105 107 105  ?CO2 '29 29 28 24 27  '$ ?GLUCOSE 118* 114* 115* 102* 120*  ?BUN 29* 27* 30* 33* 26*  ?CALCIUM 8.8* 9.6 9.1 9.1 9.2  ?CREATININE 1.36* 1.45 1.41* 1.79* 1.60*  ?GFRNONAA 54*  --  52* 39* 44*  ? ? ?LIVER FUNCTION TESTS: ?Recent Labs  ?  05/21/21 ?0911 10/09/21 ?1107  ?BILITOT 0.9 1.0  ?AST 21 20  ?ALT 21 16  ?ALKPHOS 49 57  ?PROT 7.1 7.2  ?ALBUMIN 4.0 4.4  ? ? ?Assessment and Plan: ?Pt known to IR service from right abd ST lesion bx on 11/17/21. He has a hx of recently diagnosed high grade B cell lymphoma and is scheduled today for port a cath placement to assist with treatment. PMH also sig for coronary artery disease with prior MI/bypass, apical thrombus on Coumadin, hypertension, hyperlipidemia, hypothyroidism, ischemic cardiomyopathy/CHF, osteoarthritis, periodic limb movement disorder, sciatica, vitamin D deficiency, and ICD placement.Risks and benefits of image guided port-a-catheter placement was discussed with the patient including, but not limited to bleeding, infection, pneumothorax, or fibrin sheath development and need for additional procedures. ? ?All of the patient's questions were answered, patient is agreeable to proceed. ?Consent signed and in chart. ? ? ? ?Electronically Signed: ?Autumn Messing, PA-C ?12/04/2021,  12:27 PM ? ? ?I spent a total of 25 minutes at the the patient's bedside AND on the patient's hospital floor or unit, greater than 50% of which was counseling/coordinating care for port a cath placement ? ? ? ? ? ?

## 2021-12-04 NOTE — Progress Notes (Signed)
I met with the patient and his wife during and following initial visit with Dr. Delton Coombes. Explained my role in the patient's care and provided my contact information should the patient need anything moving forward. Encouraged the patient and family to call with questions or concerns. ?

## 2021-12-08 NOTE — Progress Notes (Signed)
? ? ?Electrophysiology Office Note ?Date: 12/16/2021 ? ?ID:  [name] [name], DOB 03/06/1946, MRN [id number] ? ?PCP: Biagio Borg, MD ?Primary Cardiologist: Kirk Ruths, MD ?Electrophysiologist: Thompson Grayer, MD  ? ?CC: Routine ICD follow-up ? ?[name] [name] is a 76 y.o. male seen today for Thompson Grayer, MD for routine electrophysiology followup.  Since last being seen in our clinic the patient reports doing OK. He has been diagnosed with stomach cancer. Chemotherapy is pending.  he denies chest pain, palpitations, dyspnea, PND, orthopnea, nausea, vomiting, dizziness, syncope, weight gain, or early satiety. He has not had ICD shocks.  ? ?Device History: ?Medtronic Dual Chamber ICD implanted 01/2020 for CHF ? ?Past Medical History:  ?Diagnosis Date  ? ALLERGIC RHINITIS 10/31/2007  ? Qualifier: Diagnosis of  By: Jenny Reichmann MD, Hunt Oris   ? Allergy   ? CHOLECYSTECTOMY, HX OF 06/04/2007  ? Qualifier: Diagnosis of  By: [place], [place]    ? COLONIC POLYPS, HX OF 10/31/2007  ? Qualifier: Diagnosis of  By: Jenny Reichmann MD, Hunt Oris   ? CORONARY ARTERY BYPASS GRAFT, HX OF 06/04/2007  ? Qualifier: Diagnosis of  By: [place], [place]    ? CORONARY ARTERY DISEASE 06/04/2007  ? Qualifier: Diagnosis of  By: [place], [place]    ? Diverticulosis   ? Erectile dysfunction   ? Eye twitch   ? right eye since chilhood   ? HYPERLIPIDEMIA 06/04/2007  ? Qualifier: Diagnosis of  By: [place], [place]    ? Hypersomnolence   ? HYPERTENSION 06/04/2007  ? Qualifier: Diagnosis of  By: [place], [place]    ? Hypothyroidism   ? ISCHEMIC CARDIOMYOPATHY 06/04/2007  ? Qualifier: Diagnosis of  By: [place], [place]    ? Myocardial infarction [facility])   ? per patient , his occurred in 1998   ? Osteoarthritis   ? PLMD (periodic limb movement disorder)   ? Sleep apnea   ? no cpap' per patient , "i was checked for it and they said i didnt have it "   ? Vertigo   ? Vitamin D deficiency   ? ?Past Surgical History:  ?Procedure Laterality Date  ? BIOPSY   11/25/2021  ? Procedure: BIOPSY;  Surgeon: Jackquline Denmark, MD;  Location: [facility];  Service: Gastroenterology;;  ? CHOLECYSTECTOMY    ? COLONOSCOPY    ? COLONOSCOPY WITH PROPOFOL N/A 11/25/2021  ? Procedure: COLONOSCOPY WITH PROPOFOL;  Surgeon: Jackquline Denmark, MD;  [facility];  Service: Gastroenterology;  Laterality: N/A;  ? CORONARY ARTERY BYPASS GRAFT    ? ESOPHAGOGASTRODUODENOSCOPY (EGD) WITH PROPOFOL N/A 11/25/2021  ? Procedure: ESOPHAGOGASTRODUODENOSCOPY (EGD) WITH PROPOFOL;  Surgeon: Jackquline Denmark, MD;  [facility];  Service: Gastroenterology;  Laterality: N/A;  ? ICD IMPLANT N/A 01/30/2020  ? Procedure: ICD IMPLANT;  Surgeon: Thompson Grayer, MD;  Location: [place] [facility];  Service: Cardiovascular;  Laterality: N/A;  ? IR IMAGING GUIDED PORT INSERTION  12/04/2021  ? KNEE ARTHROPLASTY Left 08/05/2017  ? Procedure: LEFT TOTAL KNEE ARTHROPLASTY WITH COMPUTER NAVIGATION;  Surgeon: Rod Can, MD;  [facility];  Service: Orthopedics;  Laterality: Left;  Needs RNFA  ? PENILE PROSTHESIS IMPLANT    ? POLYPECTOMY  11/25/2021  ? Procedure: POLYPECTOMY;  Surgeon: Jackquline Denmark, MD;  Location: [facility];  Service: Gastroenterology;;  ? REPLACEMENT TOTAL KNEE  06/12/2013  ? spinal cyst    ? THORACOTOMY    ? left anterior; wound exploration and debridement  ? TONSILLECTOMY AND ADENOIDECTOMY    ?  age 63  ? ? ?Current Outpatient Medications  ?Medication Sig Dispense Refill  ? allopurinol (ZYLOPRIM) 300 MG tablet Take 1 tablet (300 mg total) by mouth daily. 30 tablet 3  ? ARTIFICIAL TEAR SOLUTION OP Place 1 drop into both eyes daily as needed (dry eyes).    ? Ascorbic Acid (VITA-C PO) Take 1,000 mg by mouth daily.    ? b complex vitamins capsule Take 1 capsule by mouth daily.    ? empagliflozin (JARDIANCE) 10 MG TABS tablet Take 1 tablet (10 mg total) by mouth daily before breakfast. 30 tablet 11  ? ezetimibe (ZETIA) 10 MG tablet TAKE 1 TABLET EVERY DAY (Patient taking differently: Take by  mouth every other day.) 90 tablet 3  ? furosemide (LASIX) 20 MG tablet Take 20 mg by mouth daily.    ? lidocaine-prilocaine (EMLA) cream Apply a small amount to port a cath site (do not rub in) and cover with plastic wrap 1 hour prior to infusion appointments 30 g 3  ? metoprolol succinate (TOPROL-XL) 50 MG 24 hr tablet TAKE 1 TABLET DAILY. TAKE WITH OR IMMEDIATELY FOLLOWING A MEAL. 90 tablet 1  ? Multiple Vitamins-Minerals (MULTIVITAMIN WITH MINERALS) tablet Take 1 tablet by mouth daily. Centrum silver    ? pantoprazole (PROTONIX) 40 MG tablet Take 1 tablet (40 mg total) by mouth daily. 90 tablet 3  ? predniSONE (DELTASONE) 20 MG tablet Take 5 tablets (100 mg) by mouth daily starting on the first day each treatment and the four days following. DO NOT take on weeks you are not receiving treatment 150 tablet 0  ? prochlorperazine (COMPAZINE) 10 MG tablet Take 1 tablet (10 mg total) by mouth every 6 (six) hours as needed for nausea or vomiting. 30 tablet 3  ? rosuvastatin (CRESTOR) 5 MG tablet TAKE 1 TABLET EVERY OTHER DAY 45 tablet 10  ? spironolactone (ALDACTONE) 25 MG tablet Take 0.5 tablets (12.5 mg total) by mouth daily. (Patient taking differently: Take 25 mg by mouth daily.) 45 tablet 3  ? triamcinolone (NASACORT) 55 MCG/ACT AERO nasal inhaler Place 2 sprays into the nose daily. (Patient taking differently: Place 2 sprays into the nose daily as needed (allergies).) 3 Inhaler 3  ? valsartan (DIOVAN) 160 MG tablet Take 1 tablet (160 mg total) by mouth daily. 90 tablet 3  ? VITAMIN D PO Take 1,000 Units by mouth daily.    ? warfarin (COUMADIN) 5 MG tablet TAKE 1 TABLET EVERY DAY AT 4 PM (Patient taking differently: Take 2.5-5 mg by mouth See admin instructions. Taking 5 mg every day except on Friday taking 1/2 tablet = 2.5 mg) 90 tablet 4  ? ?No current facility-administered medications for this visit.  ? ? ?Allergies:   Ace inhibitors and Statins  ? ?Social History: ?Social History  ? ?Socioeconomic History  ?  Marital status: Married  ?  Spouse name: [name]  ? Number of children: 3  ? Years of education: Not on file  ? Highest education level: Not on file  ?Occupational History  ? Occupation: truck Geophysicist/field seismologist  ?  Employer: [employer]  ?Tobacco Use  ? Smoking status: Former  ?  Packs/day: 4.00  ?  Years: 17.00  ?  Pack years: 68.00  ?  Types: Cigarettes  ?  Quit date: 05/07/1981  ?  Years since quitting: 40.6  ? Smokeless tobacco: Never  ? Tobacco comments:  ?  Pt states that he would let most of them "burn" pt states that he used anywhere  between 4-5PPD  ?Vaping Use  ? Vaping Use: Never used  ?Substance and Sexual Activity  ? Alcohol use: Yes  ?  Alcohol/week: 0.0 standard drinks  ?  Comment: rare  ? Drug use: No  ? Sexual activity: Not on file  ?Other Topics Concern  ? Not on file  ?Social History Narrative  ? Lives in [place] with spouse  ? Retired Administrator  ? ?Social Determinants of Health  ? ?Financial Resource Strain: Low Risk   ? Difficulty of Paying Living Expenses: Not hard at all  ?Food Insecurity: No Food Insecurity  ? Worried About Charity fundraiser in the Last Year: Never true  ? Ran Out of Food in the Last Year: Never true  ?Transportation Needs: No Transportation Needs  ? Lack of Transportation (Medical): No  ? Lack of Transportation (Non-Medical): No  ?Physical Activity: Inactive  ? Days of Exercise per Week: 0 days  ? Minutes of Exercise per Session: 0 min  ?Stress: No Stress Concern Present  ? Feeling of Stress : Not at all  ?Social Connections: Moderately Isolated  ? Frequency of Communication with Friends and Family: More than three times a week  ? Frequency of Social Gatherings with Friends and Family: More than three times a week  ? Attends Religious Services: Never  ? Active Member of Clubs or Organizations: No  ? Attends Archivist Meetings: Never  ? Marital Status: Married  ?Intimate Partner Violence: Not At Risk  ? Fear of Current or Ex-Partner: No  ? Emotionally Abused: No  ?  Physically Abused: No  ? Sexually Abused: No  ? ? ?Family History: ?Family History  ?Problem Relation Age of Onset  ? Stomach cancer Mother   ?     smokes  ? Lung cancer Father   ?     chewed tobacco  ? [place]

## 2021-12-09 ENCOUNTER — Encounter (HOSPITAL_COMMUNITY): Payer: Self-pay | Admitting: Hematology

## 2021-12-09 ENCOUNTER — Other Ambulatory Visit (HOSPITAL_COMMUNITY): Payer: Self-pay | Admitting: Hematology

## 2021-12-09 ENCOUNTER — Inpatient Hospital Stay (HOSPITAL_COMMUNITY): Payer: No Typology Code available for payment source | Attending: Hematology

## 2021-12-09 DIAGNOSIS — I509 Heart failure, unspecified: Secondary | ICD-10-CM | POA: Insufficient documentation

## 2021-12-09 DIAGNOSIS — I8290 Acute embolism and thrombosis of unspecified vein: Secondary | ICD-10-CM | POA: Insufficient documentation

## 2021-12-09 DIAGNOSIS — C8510 Unspecified B-cell lymphoma, unspecified site: Secondary | ICD-10-CM

## 2021-12-09 DIAGNOSIS — Z5189 Encounter for other specified aftercare: Secondary | ICD-10-CM | POA: Insufficient documentation

## 2021-12-09 DIAGNOSIS — I110 Hypertensive heart disease with heart failure: Secondary | ICD-10-CM | POA: Insufficient documentation

## 2021-12-09 DIAGNOSIS — Z5112 Encounter for antineoplastic immunotherapy: Secondary | ICD-10-CM | POA: Insufficient documentation

## 2021-12-09 DIAGNOSIS — Z87891 Personal history of nicotine dependence: Secondary | ICD-10-CM | POA: Insufficient documentation

## 2021-12-09 DIAGNOSIS — I255 Ischemic cardiomyopathy: Secondary | ICD-10-CM | POA: Insufficient documentation

## 2021-12-09 DIAGNOSIS — C8330 Diffuse large B-cell lymphoma, unspecified site: Secondary | ICD-10-CM | POA: Insufficient documentation

## 2021-12-09 DIAGNOSIS — Z5111 Encounter for antineoplastic chemotherapy: Secondary | ICD-10-CM | POA: Insufficient documentation

## 2021-12-09 MED ORDER — LIDOCAINE-PRILOCAINE 2.5-2.5 % EX CREA: TOPICAL_CREAM | CUTANEOUS | 3 refills | Status: DC

## 2021-12-09 MED ORDER — ALLOPURINOL 300 MG PO TABS: 300.0000 mg | ORAL_TABLET | Freq: Every day | ORAL | 3 refills | Status: DC

## 2021-12-09 NOTE — Progress Notes (Signed)

## 2021-12-09 NOTE — Progress Notes (Signed)
START ON PATHWAY REGIMEN - Lymphoma and CLL ? ? ?  A cycle is every 21 days: ?    Prednisone  ?    Rituximab-xxxx  ?    Cyclophosphamide  ?    Etoposide  ?    Vincristine  ?    Etoposide  ? ?**Always confirm dose/schedule in your pharmacy ordering system** ? ?Patient Characteristics: ?Diffuse Large B-Cell Lymphoma or Follicular Lymphoma, Grade 3B, First Line, Stage III and IV ?Disease Type: Not Applicable ?Disease Type: Diffuse Large B-Cell Lymphoma ?Disease Type: Not Applicable ?Line of therapy: First Line ?Intent of Therapy: ?Curative Intent, Discussed with Patient ?

## 2021-12-10 ENCOUNTER — Encounter: Payer: Self-pay | Admitting: Cardiology

## 2021-12-10 ENCOUNTER — Other Ambulatory Visit (HOSPITAL_BASED_OUTPATIENT_CLINIC_OR_DEPARTMENT_OTHER): Payer: Non-veteran care

## 2021-12-10 ENCOUNTER — Other Ambulatory Visit (HOSPITAL_COMMUNITY): Payer: Self-pay | Admitting: *Deleted

## 2021-12-10 DIAGNOSIS — I5042 Chronic combined systolic (congestive) and diastolic (congestive) heart failure: Secondary | ICD-10-CM

## 2021-12-11 ENCOUNTER — Other Ambulatory Visit (HOSPITAL_COMMUNITY): Payer: Non-veteran care

## 2021-12-11 ENCOUNTER — Ambulatory Visit (HOSPITAL_COMMUNITY)
Admission: RE | Admit: 2021-12-11 | Discharge: 2021-12-11 | Disposition: A | Discharge status: Home / Self Care | Payer: No Typology Code available for payment source | Source: Ambulatory Visit | Attending: Hematology | Admitting: Hematology

## 2021-12-11 ENCOUNTER — Ambulatory Visit (HOSPITAL_COMMUNITY): Payer: Non-veteran care

## 2021-12-11 ENCOUNTER — Encounter: Payer: Self-pay | Admitting: Internal Medicine

## 2021-12-11 ENCOUNTER — Encounter: Payer: Self-pay | Admitting: Cardiology

## 2021-12-11 ENCOUNTER — Ambulatory Visit (HOSPITAL_COMMUNITY): Payer: Non-veteran care | Admitting: Hematology

## 2021-12-11 DIAGNOSIS — I2510 Atherosclerotic heart disease of native coronary artery without angina pectoris: Secondary | ICD-10-CM | POA: Diagnosis not present

## 2021-12-11 DIAGNOSIS — I700 Atherosclerosis of aorta: Secondary | ICD-10-CM | POA: Diagnosis not present

## 2021-12-11 DIAGNOSIS — C8510 Unspecified B-cell lymphoma, unspecified site: Secondary | ICD-10-CM | POA: Insufficient documentation

## 2021-12-11 DIAGNOSIS — J439 Emphysema, unspecified: Secondary | ICD-10-CM | POA: Insufficient documentation

## 2021-12-11 MED ORDER — FLUDEOXYGLUCOSE F - 18 (FDG) INJECTION
10.7600 | Freq: Once | INTRAVENOUS | Status: AC | PRN
  Administered 2021-12-11: 10.76 via INTRAVENOUS

## 2021-12-11 MED ORDER — FUROSEMIDE 20 MG PO TABS: 40.0000 mg | ORAL_TABLET | Freq: Every day | ORAL | Status: DC

## 2021-12-11 NOTE — Progress Notes (Signed)
Pharmacist Chemotherapy Monitoring - Initial Assessment   ? ?Anticipated start date: 12/15/21  ? ?The following has been reviewed per standard work regarding the patient's treatment regimen: ?The patient's diagnosis, treatment plan and drug doses, and organ/hematologic function ?Lab orders and baseline tests specific to treatment regimen  ?The treatment plan start date, drug sequencing, and pre-medications ?Prior authorization status  ?Patient's documented medication list, including drug-drug interaction screen and prescriptions for anti-emetics and supportive care specific to the treatment regimen ?The drug concentrations, fluid compatibility, administration routes, and timing of the medications to be used ?The patient's access for treatment and lifetime cumulative dose history, if applicable  ?The patient's medication allergies and previous infusion related reactions, if applicable  ? ?Changes made to treatment plan:  ?N/A ? ?Follow up needed:  ?N/A ? ? ?Wynona Neat, [facility], ?12/11/2021  3:34 PM ? ?

## 2021-12-11 NOTE — Telephone Encounter (Signed)
Spoke with pt, last time her was here his valsartan was cut in 1/2 due to dizziness. The dizziness has resolved but he now is having weakness and SOB with little exertion. He was in the car when I first called him so I called him 30 min later. He reports his bp when he got home was 126/72/75. He sat and rested and rechecked prior to my call and it was 100/63/73. He does have some swelling in the right foot but the left is normal. He denies orthopnea and his weight is stable. The only new medication change was he was started on jardiance but he has been taking it since December. Aware will forward to dr [name] to review and advise.  ?

## 2021-12-11 NOTE — Telephone Encounter (Signed)
Spoke with [name] [name], per dr [name] the patient is to increase his furosemide to 40 mg once daily with bmp in one week. ?Lab orders mailed to the [name] [name]  ?

## 2021-12-12 ENCOUNTER — Ambulatory Visit (HOSPITAL_COMMUNITY): Payer: Non-veteran care

## 2021-12-12 NOTE — Telephone Encounter (Signed)
Copied from 12/10/21 message: ? ?Cristopher Estimable, RN ?  ?   1:24 PM ?Note ?Spoke with pt, per dr [name] the patient is to increase his furosemide to 40 mg once daily with bmp in one week. ?Lab orders mailed to the pt   ?  ? ?[name], MD ?to Cristopher Estimable, RN   ?  11:28 AM ?Lasix 20 mg daily; bmet one week; APPov  ?[facility]  ?

## 2021-12-15 ENCOUNTER — Ambulatory Visit (INDEPENDENT_AMBULATORY_CARE_PROVIDER_SITE_OTHER): Payer: Medicare HMO

## 2021-12-15 ENCOUNTER — Inpatient Hospital Stay (HOSPITAL_COMMUNITY): Payer: No Typology Code available for payment source

## 2021-12-15 VITALS — BP 123/87 | HR 58 | Temp 97.6°F | Resp 18

## 2021-12-15 DIAGNOSIS — I5042 Chronic combined systolic (congestive) and diastolic (congestive) heart failure: Secondary | ICD-10-CM

## 2021-12-15 DIAGNOSIS — Z5111 Encounter for antineoplastic chemotherapy: Secondary | ICD-10-CM | POA: Diagnosis not present

## 2021-12-15 DIAGNOSIS — Z5112 Encounter for antineoplastic immunotherapy: Secondary | ICD-10-CM | POA: Diagnosis not present

## 2021-12-15 DIAGNOSIS — Z95810 Presence of automatic (implantable) cardiac defibrillator: Secondary | ICD-10-CM | POA: Diagnosis not present

## 2021-12-15 DIAGNOSIS — C8330 Diffuse large B-cell lymphoma, unspecified site: Secondary | ICD-10-CM | POA: Diagnosis not present

## 2021-12-15 DIAGNOSIS — C8510 Unspecified B-cell lymphoma, unspecified site: Secondary | ICD-10-CM

## 2021-12-15 DIAGNOSIS — I110 Hypertensive heart disease with heart failure: Secondary | ICD-10-CM | POA: Diagnosis not present

## 2021-12-15 DIAGNOSIS — I8290 Acute embolism and thrombosis of unspecified vein: Secondary | ICD-10-CM | POA: Diagnosis not present

## 2021-12-15 DIAGNOSIS — I255 Ischemic cardiomyopathy: Secondary | ICD-10-CM | POA: Diagnosis not present

## 2021-12-15 DIAGNOSIS — I509 Heart failure, unspecified: Secondary | ICD-10-CM | POA: Diagnosis not present

## 2021-12-15 DIAGNOSIS — Z87891 Personal history of nicotine dependence: Secondary | ICD-10-CM | POA: Diagnosis not present

## 2021-12-15 DIAGNOSIS — Z5189 Encounter for other specified aftercare: Secondary | ICD-10-CM | POA: Diagnosis not present

## 2021-12-15 LAB — CBC WITH DIFFERENTIAL/PLATELET
Abs Immature Granulocytes: 0.03 10*3/uL (ref 0.00–0.07)
Basophils Absolute: 0 10*3/uL (ref 0.0–0.1)
Basophils Relative: 1 %
Eosinophils Absolute: 0.1 10*3/uL (ref 0.0–0.5)
Eosinophils Relative: 2 %
HCT: 41.6 % (ref 39.0–52.0)
Hemoglobin: 13.4 g/dL (ref 13.0–17.0)
Immature Granulocytes: 0 %
Lymphocytes Relative: 18 %
Lymphs Abs: 1.2 10*3/uL (ref 0.7–4.0)
MCH: 31.2 pg (ref 26.0–34.0)
MCHC: 32.2 g/dL (ref 30.0–36.0)
MCV: 96.7 fL (ref 80.0–100.0)
Monocytes Absolute: 0.4 10*3/uL (ref 0.1–1.0)
Monocytes Relative: 5 %
Neutro Abs: 5.2 10*3/uL (ref 1.7–7.7)
Neutrophils Relative %: 74 %
Platelets: 144 10*3/uL — ABNORMAL LOW (ref 150–400)
RBC: 4.3 MIL/uL (ref 4.22–5.81)
RDW: 12.8 % (ref 11.5–15.5)
WBC: 7 10*3/uL (ref 4.0–10.5)
nRBC: 0 % (ref 0.0–0.2)

## 2021-12-15 LAB — COMPREHENSIVE METABOLIC PANEL
ALT: 17 U/L (ref 0–44)
AST: 25 U/L (ref 15–41)
Albumin: 3.8 g/dL (ref 3.5–5.0)
Alkaline Phosphatase: 51 U/L (ref 38–126)
Anion gap: 7 (ref 5–15)
BUN: 29 mg/dL — ABNORMAL HIGH (ref 8–23)
CO2: 27 mmol/L (ref 22–32)
Calcium: 9.1 mg/dL (ref 8.9–10.3)
Chloride: 104 mmol/L (ref 98–111)
Creatinine, Ser: 1.37 mg/dL — ABNORMAL HIGH (ref 0.61–1.24)
GFR, Estimated: 53 mL/min — ABNORMAL LOW (ref >60)
Glucose, Bld: 121 mg/dL — ABNORMAL HIGH (ref 70–99)
Potassium: 4.5 mmol/L (ref 3.5–5.1)
Sodium: 138 mmol/L (ref 135–145)
Total Bilirubin: 0.9 mg/dL (ref 0.3–1.2)
Total Protein: 7.3 g/dL (ref 6.5–8.1)

## 2021-12-15 LAB — MAGNESIUM: Magnesium: 2.1 mg/dL (ref 1.7–2.4)

## 2021-12-15 MED ORDER — FAMOTIDINE IN NACL 20-0.9 MG/50ML-% IV SOLN
20.0000 mg | Freq: Once | INTRAVENOUS | Status: AC
  Administered 2021-12-15: 20 mg via INTRAVENOUS
  Filled 2021-12-15: qty 50

## 2021-12-15 MED ORDER — SODIUM CHLORIDE 0.9 % IV SOLN: Freq: Once | INTRAVENOUS | Status: AC

## 2021-12-15 MED ORDER — VINCRISTINE SULFATE CHEMO INJECTION 1 MG/ML
2.0000 mg | Freq: Once | INTRAVENOUS | Status: AC
  Administered 2021-12-15: 2 mg via INTRAVENOUS
  Filled 2021-12-15: qty 2

## 2021-12-15 MED ORDER — SODIUM CHLORIDE 0.9 % IV SOLN
375.0000 mg/m2 | Freq: Once | INTRAVENOUS | Status: AC
  Administered 2021-12-15: 800 mg via INTRAVENOUS
  Filled 2021-12-15: qty 50

## 2021-12-15 MED ORDER — PALONOSETRON HCL INJECTION 0.25 MG/5ML
0.2500 mg | Freq: Once | INTRAVENOUS | Status: AC
  Administered 2021-12-15: 0.25 mg via INTRAVENOUS
  Filled 2021-12-15: qty 5

## 2021-12-15 MED ORDER — HEPARIN SOD (PORK) LOCK FLUSH 100 UNIT/ML IV SOLN
500.0000 [IU] | Freq: Once | INTRAVENOUS | Status: AC | PRN
  Administered 2021-12-15: 500 [IU]

## 2021-12-15 MED ORDER — DIPHENHYDRAMINE HCL 25 MG PO CAPS
50.0000 mg | ORAL_CAPSULE | Freq: Once | ORAL | Status: AC
  Administered 2021-12-15: 50 mg via ORAL
  Filled 2021-12-15: qty 2

## 2021-12-15 MED ORDER — SODIUM CHLORIDE 0.9% FLUSH
10.0000 mL | INTRAVENOUS | Status: DC | PRN
  Administered 2021-12-15: 10 mL

## 2021-12-15 MED ORDER — SODIUM CHLORIDE 0.9 % IV SOLN
750.0000 mg/m2 | Freq: Once | INTRAVENOUS | Status: AC
  Administered 2021-12-15: 1620 mg via INTRAVENOUS
  Filled 2021-12-15: qty 81

## 2021-12-15 MED ORDER — SODIUM CHLORIDE 0.9 % IV SOLN
50.0000 mg/m2 | Freq: Once | INTRAVENOUS | Status: AC
  Administered 2021-12-15: 110 mg via INTRAVENOUS
  Filled 2021-12-15: qty 5.5

## 2021-12-15 MED ORDER — ACETAMINOPHEN 325 MG PO TABS
650.0000 mg | ORAL_TABLET | Freq: Once | ORAL | Status: AC
  Administered 2021-12-15: 650 mg via ORAL
  Filled 2021-12-15: qty 2

## 2021-12-15 MED ORDER — SODIUM CHLORIDE 0.9 % IV SOLN
10.0000 mg | Freq: Once | INTRAVENOUS | Status: AC
  Administered 2021-12-15: 10 mg via INTRAVENOUS
  Filled 2021-12-15: qty 10

## 2021-12-15 NOTE — Progress Notes (Signed)
EPIC Encounter for ICM Monitoring ? ?Patient Name: [name] [name] is a 76 y.o. male ?Date: 12/15/2021 ?Primary Care Physican: Biagio Borg, MD ?Primary Cardiologist: [name] ?Electrophysiologist: Allred ?09/10/2021 Weight:   206 lbs ?10/07/2021 Weight: 206 lbs (does not weight at home) ?                                                         ?  ?Spoke with wife due to patient is receiving chemo treatment for lymphoma stage 4 today and will be getting it 3 days a week in a month for total of 9 treatments.  Per Mychart message, pt was SOB and Dr [name] increased Lasix.  Did not speak with patient today so unsure if SOB has improved or resolved since lasix dosage went from qod to daily.   ?   ?Optivol thoracic impedance suggesting possible fluid accumulation 4/5.    Fluid index > normal threshold starting 2/22. ?  ?Prescribed: ?Furosemide 20 mg take 1 tablet(s) by mouth daily (increased 5/4 from every other day to daily)  ?Jardiance 10 mg take 1 tablet before breakfast ?  ?Labs: ?12/15/2021 Creatinine 1.37, BUN 29, Potassium 4.5, Sodium 138, GFR 53 ?12/02/2021 Creatinine 1.60, BUN 26, Potassium 4.7, Sodium 138, GFR 44  ?11/13/2021 Creatinine 1.79, BUN 33, Potassium 4.2, Sodium 137, GFR 39  ?10/24/2021 Creatinine 1.41, BUN 30, Potassium 4.9, Sodium 140, GFR 52 ?10/09/2021 Creatinine 1.45, BUN 27, Potassium 4.3, Sodium 137 ?A complete set of results can be found in Results Review. ?  ?Recommendations:  No changes and encouraged to call if experiencing any fluid symptoms. ?  ?Follow-up plan: ICM clinic phone appointment on 12/29/2021 to recheck fluid levels.   91 day device clinic remote transmission 01/27/2022.   ?  ?EP/Cardiology Office Visits: 03/03/2022 with Dr [name].  12/16/2021 with Oda Kilts, PA. ?  ?Copy of ICM check sent to Dr. Rayann Heman and Dr [name] as Juluis Rainier.   ? ?3 month ICM trend: 12/15/2021. ? ? ? ?12-14 Month ICM trend:  ? ? ? ?Rosalene Billings, RN ?12/15/2021 ?2:10 PM ? ?

## 2021-12-15 NOTE — Progress Notes (Signed)
Pt presents today for C1D1 R-CEOP per provider's order. Vital signs and labs WNL for treatment today. Okay to proceed with treatment. ? ?C1D1 R-CEOP given today per MD orders. Tolerated infusion without adverse affects. Vital signs stable. No complaints at this time. Discharged from clinic ambulatory in stable condition. Alert and oriented x 3. F/U with [facility] as scheduled.   ?

## 2021-12-15 NOTE — Patient Instructions (Signed)
[place]  Discharge Instructions: ?Thank you for choosing [facility] to provide your oncology and hematology care.  ?If you have a lab appointment with the [facility], please come in thru the Main Entrance and check in at the main information desk. ? ?Wear comfortable clothing and clothing appropriate for easy access to any Portacath or PICC line.  ? ?We strive to give you quality time with your provider. You may need to reschedule your appointment if you arrive late (15 or more minutes).  Arriving late affects you and other patients whose appointments are after yours.  Also, if you miss three or more appointments without notifying the office, you may be dismissed from the clinic at the provider?s discretion.    ?  ?For prescription refill requests, have your pharmacy contact our office and allow 72 hours for refills to be completed.   ? ?Today you received the following chemotherapy and/or immunotherapy agents R-CEOP ?  ?To help prevent nausea and vomiting after your treatment, we encourage you to take your nausea medication as directed. ? ?BELOW ARE SYMPTOMS THAT SHOULD BE REPORTED IMMEDIATELY: ?*FEVER GREATER THAN 100.4 F (38 ?C) OR HIGHER ?*CHILLS OR SWEATING ?*NAUSEA AND VOMITING THAT IS NOT CONTROLLED WITH YOUR NAUSEA MEDICATION ?*UNUSUAL SHORTNESS OF BREATH ?*UNUSUAL BRUISING OR BLEEDING ?*URINARY PROBLEMS (pain or burning when urinating, or frequent urination) ?*BOWEL PROBLEMS (unusual diarrhea, constipation, pain near the anus) ?TENDERNESS IN MOUTH AND THROAT WITH OR WITHOUT PRESENCE OF ULCERS (sore throat, sores in mouth, or a toothache) ?UNUSUAL RASH, SWELLING OR PAIN  ?UNUSUAL VAGINAL DISCHARGE OR ITCHING  ? ?Items with * indicate a potential emergency and should be followed up as soon as possible or go to the Emergency Department if any problems should occur. ? ?Please show the CHEMOTHERAPY ALERT CARD or IMMUNOTHERAPY ALERT CARD at check-in to the Emergency Department  and triage nurse. ? ?Should you have questions after your visit or need to cancel or reschedule your appointment, please contact [facility] [phone]  and follow the prompts.  Office hours are 8:00 a.m. to 4:30 p.m. Monday - Friday. Please note that voicemails left after 4:00 p.m. may not be returned until the following business day.  We are closed weekends and major holidays. You have access to a nurse at all times for urgent questions. Please call the main number to the clinic [phone] and follow the prompts. ? ?For any non-urgent questions, you may also contact your provider using MyChart. We now offer e-Visits for anyone 49 and older to request care online for non-urgent symptoms. For details visit [email or web address]. ?  ?Also download the MyChart app! Go to the app store, search "MyChart", open the app, select [facility], and log in with your MyChart username and password. ? ?Due to Covid, a mask is required upon entering the hospital/clinic. If you do not have a mask, one will be given to you upon arrival. For doctor visits, patients may have 1 support person aged 37 or older with them. For treatment visits, patients cannot have anyone with them due to current Covid guidelines and our immunocompromised population.  ?

## 2021-12-16 ENCOUNTER — Emergency Department (HOSPITAL_COMMUNITY)
Admission: EM | Admit: 2021-12-16 | Discharge: 2021-12-16 | Disposition: A | Discharge status: Home / Self Care | Payer: No Typology Code available for payment source | Attending: Emergency Medicine | Admitting: Emergency Medicine

## 2021-12-16 ENCOUNTER — Encounter: Payer: Self-pay | Admitting: Student

## 2021-12-16 ENCOUNTER — Ambulatory Visit: Payer: Medicare HMO | Admitting: Student

## 2021-12-16 ENCOUNTER — Emergency Department (HOSPITAL_COMMUNITY): Payer: No Typology Code available for payment source

## 2021-12-16 ENCOUNTER — Encounter (HOSPITAL_COMMUNITY): Payer: Self-pay | Admitting: Emergency Medicine

## 2021-12-16 ENCOUNTER — Inpatient Hospital Stay (HOSPITAL_COMMUNITY): Payer: No Typology Code available for payment source

## 2021-12-16 ENCOUNTER — Other Ambulatory Visit: Payer: Self-pay

## 2021-12-16 VITALS — BP 102/58 | HR 59 | Ht 71.0 in | Wt 209.0 lb

## 2021-12-16 VITALS — BP 112/67 | HR 68 | Temp 97.5°F | Resp 18 | Ht 69.49 in | Wt 210.8 lb

## 2021-12-16 DIAGNOSIS — W540XXA Bitten by dog, initial encounter: Secondary | ICD-10-CM | POA: Diagnosis not present

## 2021-12-16 DIAGNOSIS — Z7901 Long term (current) use of anticoagulants: Secondary | ICD-10-CM | POA: Insufficient documentation

## 2021-12-16 DIAGNOSIS — S61217A Laceration without foreign body of left little finger without damage to nail, initial encounter: Secondary | ICD-10-CM | POA: Diagnosis not present

## 2021-12-16 DIAGNOSIS — I10 Essential (primary) hypertension: Secondary | ICD-10-CM

## 2021-12-16 DIAGNOSIS — Z5111 Encounter for antineoplastic chemotherapy: Secondary | ICD-10-CM | POA: Diagnosis not present

## 2021-12-16 DIAGNOSIS — S6992XA Unspecified injury of left wrist, hand and finger(s), initial encounter: Secondary | ICD-10-CM | POA: Diagnosis present

## 2021-12-16 DIAGNOSIS — I2510 Atherosclerotic heart disease of native coronary artery without angina pectoris: Secondary | ICD-10-CM | POA: Diagnosis not present

## 2021-12-16 DIAGNOSIS — I5042 Chronic combined systolic (congestive) and diastolic (congestive) heart failure: Secondary | ICD-10-CM | POA: Diagnosis not present

## 2021-12-16 DIAGNOSIS — C8510 Unspecified B-cell lymphoma, unspecified site: Secondary | ICD-10-CM

## 2021-12-16 DIAGNOSIS — S61257A Open bite of left little finger without damage to nail, initial encounter: Secondary | ICD-10-CM | POA: Diagnosis not present

## 2021-12-16 LAB — CUP PACEART INCLINIC DEVICE CHECK
Battery Remaining Longevity: 104 mo
Battery Voltage: 2.98 V
Brady Statistic AP VP Percent: 0.05 %
Brady Statistic AP VS Percent: 52.12 %
Brady Statistic AS VP Percent: 0.03 %
Brady Statistic AS VS Percent: 47.8 %
Brady Statistic RA Percent Paced: 49.87 %
Brady Statistic RV Percent Paced: 0.07 %
Date Time Interrogation Session: 20230509110259
HighPow Impedance: 64 Ohm
Implantable Lead Implant Date: 20210622
Implantable Lead Implant Date: 20210622
Implantable Lead Location: 753859
Implantable Lead Location: 753860
Implantable Lead Model: 5076
Implantable Pulse Generator Implant Date: 20210622
Lead Channel Impedance Value: 304 Ohm
Lead Channel Impedance Value: 361 Ohm
Lead Channel Impedance Value: 399 Ohm
Lead Channel Pacing Threshold Amplitude: 0.5 V
Lead Channel Pacing Threshold Amplitude: 0.5 V
Lead Channel Pacing Threshold Pulse Width: 0.4 ms
Lead Channel Pacing Threshold Pulse Width: 0.4 ms
Lead Channel Sensing Intrinsic Amplitude: 4.375 mV
Lead Channel Sensing Intrinsic Amplitude: 5.375 mV
Lead Channel Sensing Intrinsic Amplitude: 6.625 mV
Lead Channel Sensing Intrinsic Amplitude: 7.25 mV
Lead Channel Setting Pacing Amplitude: 1.5 V
Lead Channel Setting Pacing Amplitude: 2.5 V
Lead Channel Setting Pacing Pulse Width: 0.4 ms
Lead Channel Setting Sensing Sensitivity: 0.3 mV

## 2021-12-16 MED ORDER — LIDOCAINE HCL (PF) 1 % IJ SOLN
5.0000 mL | Freq: Once | INTRAMUSCULAR | Status: AC
Start: 2021-12-16 — End: 2021-12-16
  Administered 2021-12-16: 5 mL
  Filled 2021-12-16: qty 5

## 2021-12-16 MED ORDER — AMOXICILLIN-POT CLAVULANATE 875-125 MG PO TABS
1.0000 | ORAL_TABLET | Freq: Once | ORAL | Status: AC
  Administered 2021-12-16: 1 via ORAL
  Filled 2021-12-16: qty 1

## 2021-12-16 MED ORDER — SODIUM CHLORIDE 0.9% FLUSH
10.0000 mL | INTRAVENOUS | Status: DC | PRN
Start: 2021-12-16 — End: 2021-12-16
  Administered 2021-12-16: 10 mL via INTRAVENOUS

## 2021-12-16 MED ORDER — SODIUM CHLORIDE 0.9 % IV SOLN: Freq: Once | INTRAVENOUS | Status: AC

## 2021-12-16 MED ORDER — SODIUM CHLORIDE 0.9% FLUSH
10.0000 mL | INTRAVENOUS | Status: DC | PRN
  Administered 2021-12-16: 10 mL

## 2021-12-16 MED ORDER — AMOXICILLIN-POT CLAVULANATE 875-125 MG PO TABS: 1.0000 | ORAL_TABLET | Freq: Two times a day (BID) | ORAL | 0 refills | Status: DC

## 2021-12-16 MED ORDER — SODIUM CHLORIDE 0.9 % IV SOLN
50.0000 mg/m2 | Freq: Once | INTRAVENOUS | Status: AC
  Administered 2021-12-16: 110 mg via INTRAVENOUS
  Filled 2021-12-16: qty 5.5

## 2021-12-16 MED ORDER — HEPARIN SOD (PORK) LOCK FLUSH 100 UNIT/ML IV SOLN
500.0000 [IU] | Freq: Once | INTRAVENOUS | Status: AC | PRN
  Administered 2021-12-16: 500 [IU]

## 2021-12-16 MED ORDER — PROCHLORPERAZINE MALEATE 10 MG PO TABS
10.0000 mg | ORAL_TABLET | Freq: Once | ORAL | Status: AC
  Administered 2021-12-16: 10 mg via ORAL
  Filled 2021-12-16: qty 1

## 2021-12-16 NOTE — Discharge Instructions (Addendum)
Take Augmentin twice daily for the next 10 days.  First dose was given here in the ED, he can start taking it again tomorrow morning. ?The sutures will need to be removed in 7 to 10 days, you can do this at your primary care's office.  Please get an appointment for next week for reevaluation to make sure the wound is healed well. ?Keep dry for next 24 hours, after that you can cover with a Band-Aid and apply topical antibiotic ointment such as Neosporin or Polysporin if desired. ?If you develop fevers, pus comes out of the wound, redness spreads around the finger those are signs of infection and he should return back to the ED for evaluation. ?

## 2021-12-16 NOTE — Patient Instructions (Signed)
[facility]  Discharge Instructions: Thank you for choosing [facility] to provide your oncology and hematology care.  If you have a lab appointment with the Cancer Center, please come in thru the Main Entrance and check in at the main information desk.  Wear comfortable clothing and clothing appropriate for easy access to any Portacath or PICC line.   We strive to give you quality time with your provider. You may need to reschedule your appointment if you arrive late (15 or more minutes).  Arriving late affects you and other patients whose appointments are after yours.  Also, if you miss three or more appointments without notifying the office, you may be dismissed from the clinic at the provider's discretion.      For prescription refill requests, have your pharmacy contact our office and allow 72 hours for refills to be completed.    Today you received the following chemotherapy and/or immunotherapy agents Etoposide      To help prevent nausea and vomiting after your treatment, we encourage you to take your nausea medication as directed.  BELOW ARE SYMPTOMS THAT SHOULD BE REPORTED IMMEDIATELY: *FEVER GREATER THAN 100.4 F (38 C) OR HIGHER *CHILLS OR SWEATING *NAUSEA AND VOMITING THAT IS NOT CONTROLLED WITH YOUR NAUSEA MEDICATION *UNUSUAL SHORTNESS OF BREATH *UNUSUAL BRUISING OR BLEEDING *URINARY PROBLEMS (pain or burning when urinating, or frequent urination) *BOWEL PROBLEMS (unusual diarrhea, constipation, pain near the anus) TENDERNESS IN MOUTH AND THROAT WITH OR WITHOUT PRESENCE OF ULCERS (sore throat, sores in mouth, or a toothache) UNUSUAL RASH, SWELLING OR PAIN  UNUSUAL VAGINAL DISCHARGE OR ITCHING   Items with * indicate a potential emergency and should be followed up as soon as possible or go to the Emergency Department if any problems should occur.  Please show the CHEMOTHERAPY ALERT CARD or IMMUNOTHERAPY ALERT CARD at check-in to the Emergency  Department and triage nurse.  Should you have questions after your visit or need to cancel or reschedule your appointment, please contact [facility] [phone]  and follow the prompts.  Office hours are 8:00 a.m. to 4:30 p.m. Monday - Friday. Please note that voicemails left after 4:00 p.m. may not be returned until the following business day.  We are closed weekends and major holidays. You have access to a nurse at all times for urgent questions. Please call the main number to the clinic [phone] and follow the prompts.  For any non-urgent questions, you may also contact your provider using MyChart. We now offer e-Visits for anyone 18 and older to request care online for non-urgent symptoms. For details visit [email or web address].   Also download the MyChart app! Go to the app store, search "MyChart", open the app, select [place], and log in with your MyChart username and password.  Due to Covid, a mask is required upon entering the hospital/clinic. If you do not have a mask, one will be given to you upon arrival. For doctor visits, patients may have 1 support person aged 18 or older with them. For treatment visits, patients cannot have anyone with them due to current Covid guidelines and our immunocompromised population.  

## 2021-12-16 NOTE — ED Notes (Signed)
Dressing applied to left 5th digit , applied xeroform, telfa pad and kling. Patient tolerated procedure. ?

## 2021-12-16 NOTE — ED Triage Notes (Signed)
Pt states he was giving his puppy a piece of food and one of the puppy teeth caught his left pinky finger. Pt does take blood thinner. Bleeding controlled at this time. Pt states puppy is up-to-date on shots.  ?

## 2021-12-16 NOTE — Progress Notes (Signed)
Pt presents today for C1D2 Etopside per provider's order. Vital signs stable and pt voiced no new complaints at this time. ? ?C1D2 Etopside given today per MD orders. Tolerated infusion without adverse affects. Vital signs stable. No complaints at this time. Discharged from clinic ambulatory in stable condition. Alert and oriented x 3. F/U with [facility] as scheduled.   ?

## 2021-12-16 NOTE — Patient Instructions (Addendum)
Medication Instructions:  ?Your physician has recommended you make the following change in your medication:  ? ?INCREASE: Furosemide (lasix) to '40mg'$  daily for 3 days then take '20mg'$  daily ? ?*If you need a refill on your cardiac medications before your next appointment, please call your pharmacy* ? ? ?Lab Work: ?None ?If you have labs (blood work) drawn today and your tests are completely normal, you will receive your results only by: ?MyChart Message (if you have MyChart) OR ?A paper copy in the mail ?If you have any lab test that is abnormal or we need to change your treatment, we will call you to review the results. ? ? ?Testing/Procedures: BOTH TEST AT [facility]** ?Your physician has requested that you have an echocardiogram. Echocardiography is a painless test that uses sound waves to create images of your heart. It provides your doctor with information about the size and shape of your heart and how well your heart?s chambers and valves are working. This procedure takes approximately one hour. There are no restrictions for this procedure. ? ?Your physician has requested that you have an ankle brachial index (ABI). During this test an ultrasound and blood pressure cuff are used to evaluate the arteries that supply the arms and legs with blood. Allow thirty minutes for this exam. There are no restrictions or special instructions. ? ?Follow-Up: ?At [facility], you and your health needs are our priority.  As part of our continuing mission to provide you with exceptional heart care, we have created designated Provider Care Teams.  These Care Teams include your primary Cardiologist (physician) and Advanced Practice Providers (APPs -  Physician Assistants and Nurse Practitioners) who all work together to provide you with the care you need, when you need it. ? ? ?Your next appointment:   ?6 month(s) ? ?The format for your next appointment:   ?In Person ? ?Provider:   ?Legrand Como "Jonni Sanger" [place], PA-C  ?  ?

## 2021-12-16 NOTE — ED Provider Notes (Signed)
?[name] [name] ?Provider Note ? ? ?CSN: [id number] ?Arrival date & time: 12/16/21  2004 ? ?  ? ?History ? ?Chief Complaint  ?Patient presents with  ? Animal Bite  ? ? ?[name] [name] is a 76 y.o. male. ? ? ?Animal Bite ? ?Patient presents due to animal bite to left pinky.  The puppy is his dog, the dog is up-to-date on vaccines.  Patient tetanus is also up-to-date.  He is anticoagulated on Coumadin, bleeding was controlled with direct pressure.  Denies any paresthesias, pain is only when he flexes and extends it. ? ?Home Medications ?Prior to Admission medications   ?Medication Sig Start Date End Date Taking? Authorizing Provider  ?amoxicillin-clavulanate (AUGMENTIN) 875-125 MG tablet Take 1 tablet by mouth every 12 (twelve) hours. 12/16/21  Yes Sherrill Raring, PA-C  ?allopurinol (ZYLOPRIM) 300 MG tablet Take 1 tablet (300 mg total) by mouth daily. 12/09/21   Derek Jack, MD  ?ARTIFICIAL TEAR SOLUTION OP Place 1 drop into both eyes daily as needed (dry eyes).    [provider]  ?Ascorbic Acid (VITA-C PO) Take 1,000 mg by mouth daily.    [provider]  ?b complex vitamins capsule Take 1 capsule by mouth daily.    [provider]  ?empagliflozin (JARDIANCE) 10 MG TABS tablet Take 1 tablet (10 mg total) by mouth daily before breakfast. 05/08/21   [name], MD  ?ezetimibe (ZETIA) 10 MG tablet TAKE 1 TABLET EVERY DAY ?Patient taking differently: Take by mouth every other day. 07/21/21   [name], MD  ?furosemide (LASIX) 20 MG tablet Take 20 mg by mouth daily.    [provider]  ?lidocaine-prilocaine (EMLA) cream Apply a small amount to port a cath site (do not rub in) and cover with plastic wrap 1 hour prior to infusion appointments 12/09/21   Derek Jack, MD  ?metoprolol succinate (TOPROL-XL) 50 MG 24 hr tablet TAKE 1 TABLET DAILY. TAKE WITH OR IMMEDIATELY FOLLOWING A MEAL. 05/28/21   [name], MD  ?Multiple Vitamins-Minerals  (MULTIVITAMIN WITH MINERALS) tablet Take 1 tablet by mouth daily. Centrum silver    [provider]  ?pantoprazole (PROTONIX) 40 MG tablet Take 1 tablet (40 mg total) by mouth daily. 11/25/21 11/25/22  Jackquline Denmark, MD  ?predniSONE (DELTASONE) 20 MG tablet Take 5 tablets (100 mg) by mouth daily starting on the first day each treatment and the four days following. DO NOT take on weeks you are not receiving treatment 12/03/21   Derek Jack, MD  ?prochlorperazine (COMPAZINE) 10 MG tablet Take 1 tablet (10 mg total) by mouth every 6 (six) hours as needed for nausea or vomiting. 12/03/21   Derek Jack, MD  ?rosuvastatin (CRESTOR) 5 MG tablet TAKE 1 TABLET EVERY OTHER DAY 07/09/21   [name], MD  ?spironolactone (ALDACTONE) 25 MG tablet Take 0.5 tablets (12.5 mg total) by mouth daily. ?Patient taking differently: Take 25 mg by mouth daily. 01/14/21 11/18/21  [name], MD  ?triamcinolone (NASACORT) 55 MCG/ACT AERO nasal inhaler Place 2 sprays into the nose daily. ?Patient taking differently: Place 2 sprays into the nose daily as needed (allergies). 03/22/19   Biagio Borg, MD  ?valsartan (DIOVAN) 160 MG tablet Take 1 tablet (160 mg total) by mouth daily. 11/06/21   [name], MD  ?VITAMIN D PO Take 1,000 Units by mouth daily.    [provider]  ?warfarin (COUMADIN) 5 MG tablet TAKE 1 TABLET EVERY DAY AT 4 PM ?  Patient taking differently: Take 2.5-5 mg by mouth See admin instructions. Taking 5 mg every day except on Friday taking 1/2 tablet = 2.5 mg 05/29/21   Biagio Borg, MD  ?   ? ?Allergies    ?Ace inhibitors and Statins   ? ?Review of Systems   ?Review of Systems ? ?Physical Exam ?Updated Vital Signs ?BP 128/66 (BP Location: Right Arm)   Pulse (!) 56   Temp 98 ?F (36.7 ?C) (Oral)   Resp 17   Ht '5\' 9"'$  (1.753 m)   Wt 95.6 kg   SpO2 95%   BMI 31.13 kg/m?  ?Physical Exam ?Vitals and nursing note reviewed. Exam conducted with a chaperone present.   ?Constitutional:   ?   General: He is not in acute distress. ?   Appearance: Normal appearance.  ?HENT:  ?   Head: Normocephalic and atraumatic.  ?Eyes:  ?   General: No scleral icterus. ?   Extraocular Movements: Extraocular movements intact.  ?   Pupils: Pupils are equal, round, and reactive to light.  ?Cardiovascular:  ?   Pulses: Normal pulses.  ?Musculoskeletal:     ?   General: Tenderness present. Normal range of motion.  ?Skin: ?   Coloration: Skin is not jaundiced.  ?   Comments: Laceration as detailed in picture below  ?Neurological:  ?   Mental Status: He is alert. Mental status is at baseline.  ?   Coordination: Coordination normal.  ? ? ? ?ED Results / Procedures / Treatments   ?Labs ?(all labs ordered are listed, but only abnormal results are displayed) ?Labs Reviewed - No data to display ? ?EKG ?None ? ?Radiology ?DG Finger Little Left ? ?Result Date: 12/16/2021 ?CLINICAL DATA:  Dog bite EXAM: LEFT LITTLE FINGER 2+V COMPARISON:  None Available. FINDINGS: No fracture or malalignment. Mild degenerative change at the D IP joint. No radiopaque foreign body IMPRESSION: No acute osseous abnormality Electronically Signed   By: Donavan Foil M.D.   On: 12/16/2021 21:22  ? ?C[facility] [place] ? ?Result Date: 12/16/2021 ?ICD check in clinic. Normal device function. Thresholds and sensing consistent with previous device measurements. Impedance trends stable over time. No mode switches. No ventricular arrhythmias. Histogram distribution appropriate for patient and level of  activity. No changes made this session. Device programmed at appropriate safety margins. Optivol trending up, thoracic impedence down. Estimated longevity 8 yr, 8 mo. Pt enrolled in remote follow-up.  ? ?Procedures ?Marland Kitchen.Laceration Repair ? ?Date/Time: 12/16/2021 10:51 PM ?Performed by: Sherrill Raring, PA-C ?Authorized by: Sherrill Raring, PA-C  ? ?Consent:  ?  Consent obtained:  Verbal ?  Consent given by:  Patient ?  Risks discussed:   Infection, need for additional repair, pain, poor cosmetic result and poor wound healing ?  Alternatives discussed:  No treatment and delayed treatment ?Universal protocol:  ?  Procedure explained and questions answered to patient or proxy's satisfaction: yes   ?  Relevant documents present and verified: yes   ?  Test results available: yes   ?  Imaging studies available: yes   ?  Required blood products, implants, devices, and special equipment available: yes   ?  Site/side marked: yes   ?  Immediately prior to procedure, a time out was called: yes   ?  Patient identity confirmed:  Verbally with patient ?Anesthesia:  ?  Anesthesia method:  Local infiltration ?  Local anesthetic:  Lidocaine 1% w/o epi ?Laceration details:  ?  Location:  Finger ?  Finger location:  R small finger ?  Length (cm):  2 ?  Depth (mm):  2 ?Pre-procedure details:  ?  Preparation:  Patient was prepped and draped in usual sterile fashion and imaging obtained to evaluate for foreign bodies ?Exploration:  ?  Limited defect created (wound extended): no   ?  Hemostasis achieved with:  Direct pressure ?  Contaminated: no   ?Treatment:  ?  Area cleansed with:  Chlorhexidine ?  Amount of cleaning:  Standard ?  Irrigation solution:  Sterile saline ?  Irrigation volume:  2 L ?  Irrigation method:  Pressure wash ?  Debridement:  None ?Skin repair:  ?  Repair method:  Sutures ?  Suture size:  5-0 ?  Suture material:  Nylon ?  Suture technique:  Simple interrupted ?  Number of sutures:  3 ?Approximation:  ?  Approximation:  Loose ?Repair type:  ?  Repair type:  Simple ?Post-procedure details:  ?  Dressing:  Antibiotic ointment and non-adherent dressing ?  Procedure completion:  Tolerated well, no immediate complications  ? ? ?Medications Ordered in ED ?Medications  ?lidocaine (PF) (XYLOCAINE) 1 % injection 5 mL (5 mLs Infiltration Given 12/16/21 2210)  ?amoxicillin-clavulanate (AUGMENTIN) 875-125 MG per tablet 1 tablet (1 tablet Oral Given 12/16/21 2147)   ? ? ?ED Course/ Medical Decision Making/ A&P ?  ?                        ?Medical Decision Making ?Amount and/or Complexity of Data Reviewed ?Radiology: ordered. ? ?Risk ?Prescription drug management. ? ? ?Patient presents due to a

## 2021-12-17 ENCOUNTER — Inpatient Hospital Stay (HOSPITAL_COMMUNITY): Payer: No Typology Code available for payment source

## 2021-12-17 VITALS — BP 129/81 | HR 63 | Temp 97.6°F | Resp 18

## 2021-12-17 DIAGNOSIS — Z5111 Encounter for antineoplastic chemotherapy: Secondary | ICD-10-CM | POA: Diagnosis not present

## 2021-12-17 DIAGNOSIS — C8510 Unspecified B-cell lymphoma, unspecified site: Secondary | ICD-10-CM

## 2021-12-17 MED ORDER — HEPARIN SOD (PORK) LOCK FLUSH 100 UNIT/ML IV SOLN
500.0000 [IU] | Freq: Once | INTRAVENOUS | Status: AC | PRN
  Administered 2021-12-17: 500 [IU]

## 2021-12-17 MED ORDER — SODIUM CHLORIDE 0.9 % IV SOLN: Freq: Once | INTRAVENOUS | Status: AC

## 2021-12-17 MED ORDER — PALONOSETRON HCL INJECTION 0.25 MG/5ML
0.2500 mg | Freq: Once | INTRAVENOUS | Status: AC
  Administered 2021-12-17: 0.25 mg via INTRAVENOUS
  Filled 2021-12-17: qty 5

## 2021-12-17 MED ORDER — SODIUM CHLORIDE 0.9% FLUSH
10.0000 mL | INTRAVENOUS | Status: DC | PRN
  Administered 2021-12-17: 10 mL

## 2021-12-17 MED ORDER — SODIUM CHLORIDE 0.9 % IV SOLN
50.0000 mg/m2 | Freq: Once | INTRAVENOUS | Status: AC
  Administered 2021-12-17: 110 mg via INTRAVENOUS
  Filled 2021-12-17: qty 5.5

## 2021-12-17 NOTE — Patient Instructions (Signed)
[facility]  Discharge Instructions: Thank you for choosing [facility] to provide your oncology and hematology care.  If you have a lab appointment with the Cancer Center, please come in thru the Main Entrance and check in at the main information desk.  Wear comfortable clothing and clothing appropriate for easy access to any Portacath or PICC line.   We strive to give you quality time with your provider. You may need to reschedule your appointment if you arrive late (15 or more minutes).  Arriving late affects you and other patients whose appointments are after yours.  Also, if you miss three or more appointments without notifying the office, you may be dismissed from the clinic at the provider's discretion.      For prescription refill requests, have your pharmacy contact our office and allow 72 hours for refills to be completed.    Today you received the following chemotherapy and/or immunotherapy agents:  etoposide      To help prevent nausea and vomiting after your treatment, we encourage you to take your nausea medication as directed.  BELOW ARE SYMPTOMS THAT SHOULD BE REPORTED IMMEDIATELY: *FEVER GREATER THAN 100.4 F (38 C) OR HIGHER *CHILLS OR SWEATING *NAUSEA AND VOMITING THAT IS NOT CONTROLLED WITH YOUR NAUSEA MEDICATION *UNUSUAL SHORTNESS OF BREATH *UNUSUAL BRUISING OR BLEEDING *URINARY PROBLEMS (pain or burning when urinating, or frequent urination) *BOWEL PROBLEMS (unusual diarrhea, constipation, pain near the anus) TENDERNESS IN MOUTH AND THROAT WITH OR WITHOUT PRESENCE OF ULCERS (sore throat, sores in mouth, or a toothache) UNUSUAL RASH, SWELLING OR PAIN  UNUSUAL VAGINAL DISCHARGE OR ITCHING   Items with * indicate a potential emergency and should be followed up as soon as possible or go to the Emergency Department if any problems should occur.  Please show the CHEMOTHERAPY ALERT CARD or IMMUNOTHERAPY ALERT CARD at check-in to the Emergency  Department and triage nurse.  Should you have questions after your visit or need to cancel or reschedule your appointment, please contact [facility] [phone]  and follow the prompts.  Office hours are 8:00 a.m. to 4:30 p.m. Monday - Friday. Please note that voicemails left after 4:00 p.m. may not be returned until the following business day.  We are closed weekends and major holidays. You have access to a nurse at all times for urgent questions. Please call the main number to the clinic [phone] and follow the prompts.  For any non-urgent questions, you may also contact your provider using MyChart. We now offer e-Visits for anyone 18 and older to request care online for non-urgent symptoms. For details visit [email or web address].   Also download the MyChart app! Go to the app store, search "MyChart", open the app, select [facility], and log in with your MyChart username and password.  Due to Covid, a mask is required upon entering the hospital/clinic. If you do not have a mask, one will be given to you upon arrival. For doctor visits, patients may have 1 support person aged 18 or older with them. For treatment visits, patients cannot have anyone with them due to current Covid guidelines and our immunocompromised population.  

## 2021-12-17 NOTE — Progress Notes (Signed)
Patient here for day 3 of treatment today. Patient had to get stitches on his left pinkie finger, he is on Augmentin . Verified with pharmacist Henreitta Leber ok to give VP16 today. Ok to proceed. ? ?Treatment given per orders. Patient tolerated it well without problems. Vitals stable and discharged home from clinic ambulatory. Follow up as scheduled. ?  ?

## 2021-12-18 ENCOUNTER — Inpatient Hospital Stay (HOSPITAL_COMMUNITY): Payer: No Typology Code available for payment source

## 2021-12-18 ENCOUNTER — Ambulatory Visit (INDEPENDENT_AMBULATORY_CARE_PROVIDER_SITE_OTHER): Payer: Medicare HMO | Admitting: *Deleted

## 2021-12-18 ENCOUNTER — Telehealth (HOSPITAL_COMMUNITY): Payer: Self-pay

## 2021-12-18 DIAGNOSIS — I240 Acute coronary thrombosis not resulting in myocardial infarction: Secondary | ICD-10-CM

## 2021-12-18 DIAGNOSIS — Z5181 Encounter for therapeutic drug level monitoring: Secondary | ICD-10-CM | POA: Diagnosis not present

## 2021-12-18 LAB — POCT INR: INR: 4.7 — AB (ref 2.0–3.0)

## 2021-12-18 NOTE — Patient Instructions (Signed)
Dx with stomach Cancer.  Started chemo 12/15/21 ?Takes prednisone '100mg'$  first 5 days of treatment ?Hold warfarin tonight and tomorrow night then decrease dose to 1 tablet daily except 1/2 tablet on Mondays, Wednesdays and Fridays ?Recheck in 2 wks ?

## 2021-12-18 NOTE — Telephone Encounter (Signed)
Patient called for 24-hour follow-up, no answer from patient. Patient will be in the clinic tomorrow to receive his injection. ?

## 2021-12-19 ENCOUNTER — Inpatient Hospital Stay (HOSPITAL_COMMUNITY): Payer: No Typology Code available for payment source

## 2021-12-19 VITALS — BP 142/74 | HR 62 | Temp 96.3°F | Resp 20

## 2021-12-19 DIAGNOSIS — C8510 Unspecified B-cell lymphoma, unspecified site: Secondary | ICD-10-CM

## 2021-12-19 DIAGNOSIS — Z5111 Encounter for antineoplastic chemotherapy: Secondary | ICD-10-CM | POA: Diagnosis not present

## 2021-12-19 MED ORDER — PEGFILGRASTIM-CBQV 6 MG/0.6ML ~~LOC~~ SOSY
6.0000 mg | PREFILLED_SYRINGE | Freq: Once | SUBCUTANEOUS | Status: AC
  Administered 2021-12-19: 6 mg via SUBCUTANEOUS
  Filled 2021-12-19: qty 0.6

## 2021-12-19 NOTE — Progress Notes (Signed)
Django T Yeomans presents today for injection per the provider's orders.  Udenyca administration without incident; injection site WNL; see MAR for injection details.  Patient tolerated procedure well and without incident.  No questions or complaints noted at this time.   Discharged from clinic ambulatory in stable condition. Alert and oriented x 3. F/U with [facility] as scheduled.   

## 2021-12-19 NOTE — Patient Instructions (Signed)
[name] [name]  Discharge Instructions: ?Thank you for choosing [facility] to provide your oncology and hematology care.  ?If you have a lab appointment with the [facility], please come in thru the Main Entrance and check in at the main information desk. ? ?Wear comfortable clothing and clothing appropriate for easy access to any Portacath or PICC line.  ? ?We strive to give you quality time with your provider. You may need to reschedule your appointment if you arrive late (15 or more minutes).  Arriving late affects you and other patients whose appointments are after yours.  Also, if you miss three or more appointments without notifying the office, you may be dismissed from the clinic at the provider?s discretion.    ?  ?For prescription refill requests, have your pharmacy contact our office and allow 72 hours for refills to be completed.   ? ?Today you received the following chemotherapy and/or immunotherapy agents Udenyca injection. ?  ? ? ?BELOW ARE SYMPTOMS THAT SHOULD BE REPORTED IMMEDIATELY: ?*FEVER GREATER THAN 100.4 F (38 ?C) OR HIGHER ?*CHILLS OR SWEATING ?*NAUSEA AND VOMITING THAT IS NOT CONTROLLED WITH YOUR NAUSEA MEDICATION ?*UNUSUAL SHORTNESS OF BREATH ?*UNUSUAL BRUISING OR BLEEDING ?*URINARY PROBLEMS (pain or burning when urinating, or frequent urination) ?*BOWEL PROBLEMS (unusual diarrhea, constipation, pain near the anus) ?TENDERNESS IN MOUTH AND THROAT WITH OR WITHOUT PRESENCE OF ULCERS (sore throat, sores in mouth, or a toothache) ?UNUSUAL RASH, SWELLING OR PAIN  ?UNUSUAL VAGINAL DISCHARGE OR ITCHING  ? ?Items with * indicate a potential emergency and should be followed up as soon as possible or go to the Emergency Department if any problems should occur. ? ?Please show the CHEMOTHERAPY ALERT CARD or IMMUNOTHERAPY ALERT CARD at check-in to the Emergency Department and triage nurse. ? ?Should you have questions after your visit or need to cancel or reschedule your  appointment, please contact [facility] [phone]  and follow the prompts.  Office hours are 8:00 a.m. to 4:30 p.m. Monday - Friday. Please note that voicemails left after 4:00 p.m. may not be returned until the following business day.  We are closed weekends and major holidays. You have access to a nurse at all times for urgent questions. Please call the main number to the clinic [phone] and follow the prompts. ? ?For any non-urgent questions, you may also contact your provider using MyChart. We now offer e-Visits for anyone 83 and older to request care online for non-urgent symptoms. For details visit [email or web address]. ?  ?Also download the MyChart app! Go to the app store, search "MyChart", open the app, select [facility], and log in with your MyChart username and password. ? ?Due to Covid, a mask is required upon entering the hospital/clinic. If you do not have a mask, one will be given to you upon arrival. For doctor visits, patients may have 1 support person aged 38 or older with them. For treatment visits, patients cannot have anyone with them due to current Covid guidelines and our immunocompromised population.  ?

## 2021-12-21 NOTE — Progress Notes (Signed)
? ?[facility] ?[address] ?[place], [place] [address] ? ? ?CLINIC:  ?Medical Oncology/Hematology ? ?PCP:  ?Biagio Borg, MD ?[address] / [place] [address] ?[phone] ? ? ?REASON FOR VISIT:   New start chemotherapy follow-up visit (high-grade B-cell lymphoma) ? ?CURRENT THERAPY: R-EPOCH every 21 days ?(Prednisone + rituximab + cyclophosphamide + etoposide + vincristine) ? ?LAST TREATMENT DATE: Day 1 cycle 1 12/15/2021 ? ?BRIEF ONCOLOGIC HISTORY:  ?Oncology History  ?High grade B-cell lymphoma ([facility])  ?12/03/2021 Initial Diagnosis  ? High grade B-cell lymphoma ([facility]) ? ?  ?12/15/2021 -  Chemotherapy  ? Patient is on Treatment Plan : NON-HODGKIN'S LYMPHOMA R-CEOP q21d x 3 Cycles  ? ?   ? ? ?CANCER STAGING: ? Cancer Staging  ?High grade B-cell lymphoma ([facility]) ?Staging form: Hodgkin and Non-Hodgkin Lymphoma, AJCC 8th Edition ?- Clinical stage from 12/03/2021: Stage IV (Diffuse large B-cell lymphoma) - Unsigned ? ? ?INTERVAL HISTORY:  ?[name], a 76 y.o. male, is managed by Dr. Delton Coombes for stage IVb high-grade B-cell lymphoma.  He received first cycle of treatment with R-EPOCH on 12/15/2021. He is seen today for toxicity check and follow-up of new start chemo. ? ?He has been feeling remarkably well after receiving treatment.  He does not have any complaints during today's visit, and actually reports that he has been feeling better after starting chemotherapy.  He denies any nausea, vomiting, diarrhea, constipation, mouth sores, or new pain.  He reports that he had a poor appetite before starting chemotherapy, but has had a good appetite ever since he started chemo.  His weight today (205 pounds) is down about 5 pounds from a week ago, but essentially stable compared to weight from 2 weeks ago.  Suspect that he may have lost some fluid weight as he recently had his diuretic dose increased by his primary care doctor.  He has not had any increased peripheral edema or shortness of breath  after starting chemo treatment. ? ? ?REVIEW OF SYSTEMS:  ?Review of Systems  ?Constitutional:  Negative for appetite change, chills, diaphoresis, fatigue, fever and unexpected weight change.  ?HENT:   Negative for lump/mass and nosebleeds.   ?Eyes:  Negative for eye problems.  ?Respiratory:  Negative for cough, hemoptysis and shortness of breath.   ?Cardiovascular:  Negative for chest pain, leg swelling and palpitations.  ?Gastrointestinal:  Negative for abdominal pain, blood in stool, constipation, diarrhea, nausea and vomiting.  ?Genitourinary:  Negative for hematuria.   ?Skin: Negative.   ?Neurological:  Negative for dizziness, headaches and light-headedness.  ?Hematological:  Does not bruise/bleed easily.  ?Psychiatric/Behavioral:  Positive for sleep disturbance (difficulty staying asleep).   ? ?PAST MEDICAL/SURGICAL HISTORY:  ?Past Medical History:  ?Diagnosis Date  ? ALLERGIC RHINITIS 10/31/2007  ? Qualifier: Diagnosis of  By: Jenny Reichmann MD, Hunt Oris   ? Allergy   ? CHOLECYSTECTOMY, HX OF 06/04/2007  ? Qualifier: Diagnosis of  By: [place], [place]    ? COLONIC POLYPS, HX OF 10/31/2007  ? Qualifier: Diagnosis of  By: Jenny Reichmann MD, Hunt Oris   ? CORONARY ARTERY BYPASS GRAFT, HX OF 06/04/2007  ? Qualifier: Diagnosis of  By: [place], [place]    ? CORONARY ARTERY DISEASE 06/04/2007  ? Qualifier: Diagnosis of  By: [place], [place]    ? Diverticulosis   ? Erectile dysfunction   ? Eye twitch   ? right eye since chilhood   ? HYPERLIPIDEMIA 06/04/2007  ? Qualifier: Diagnosis of  By:  [place], [place]    ? Hypersomnolence   ? HYPERTENSION 06/04/2007  ? Qualifier: Diagnosis of  By: [place], [place]    ? Hypothyroidism   ? ISCHEMIC CARDIOMYOPATHY 06/04/2007  ? Qualifier: Diagnosis of  By: [place], [place]    ? Myocardial infarction [facility])   ? per patient , his occurred in 1998   ? Osteoarthritis   ? PLMD (periodic limb movement disorder)   ? Sleep apnea   ? no cpap' per patient , "i was checked for it and they said i didnt  have it "   ? Vertigo   ? Vitamin D deficiency   ? ?Past Surgical History:  ?Procedure Laterality Date  ? BIOPSY  11/25/2021  ? Procedure: BIOPSY;  Surgeon: Jackquline Denmark, MD;  Location: [facility];  Service: Gastroenterology;;  ? CHOLECYSTECTOMY    ? COLONOSCOPY    ? COLONOSCOPY WITH PROPOFOL N/A 11/25/2021  ? Procedure: COLONOSCOPY WITH PROPOFOL;  Surgeon: Jackquline Denmark, MD;  Location: [facility];  Service: Gastroenterology;  Laterality: N/A;  ? CORONARY ARTERY BYPASS GRAFT    ? ESOPHAGOGASTRODUODENOSCOPY (EGD) WITH PROPOFOL N/A 11/25/2021  ? Procedure: ESOPHAGOGASTRODUODENOSCOPY (EGD) WITH PROPOFOL;  Surgeon: Jackquline Denmark, MD;  Location: [facility];  Service: Gastroenterology;  Laterality: N/A;  ? ICD IMPLANT N/A 01/30/2020  ? Procedure: ICD IMPLANT;  Surgeon: Thompson Grayer, MD;  Location: [facility];  Service: Cardiovascular;  Laterality: N/A;  ? IR IMAGING GUIDED PORT INSERTION  12/04/2021  ? KNEE ARTHROPLASTY Left 08/05/2017  ? Procedure: LEFT TOTAL KNEE ARTHROPLASTY WITH COMPUTER NAVIGATION;  Surgeon: Rod Can, MD;  Location: [facility];  Service: Orthopedics;  Laterality: Left;  Needs RNFA  ? PENILE PROSTHESIS IMPLANT    ? POLYPECTOMY  11/25/2021  ? Procedure: POLYPECTOMY;  Surgeon: Jackquline Denmark, MD;  Location: [facility];  Service: Gastroenterology;;  ? REPLACEMENT TOTAL KNEE  06/12/2013  ? spinal cyst    ? THORACOTOMY    ? left anterior; wound exploration and debridement  ? TONSILLECTOMY AND ADENOIDECTOMY    ? age 3  ? ? ?SOCIAL HISTORY:  ?Social History  ? ?Socioeconomic History  ? Marital status: Married  ?  Spouse name: [name]  ? Number of children: 3  ? Years of education: Not on file  ? Highest education level: Not on file  ?Occupational History  ? Occupation: truck Geophysicist/field seismologist  ?  Employer: [employer]  ?Tobacco Use  ? Smoking status: Former  ?  Packs/day: 4.00  ?  Years: 17.00  ?  Pack years: 68.00  ?  Types: Cigarettes  ?  Quit date: 05/07/1981  ?  Years since quitting: 40.6  ?  Smokeless tobacco: Never  ? Tobacco comments:  ?  Pt states that he would let most of them "burn" pt states that he used anywhere between 4-5PPD  ?Vaping Use  ? Vaping Use: Never used  ?Substance and Sexual Activity  ? Alcohol use: Yes  ?  Alcohol/week: 0.0 standard drinks  ?  Comment: rare  ? Drug use: No  ? Sexual activity: Not on file  ?Other Topics Concern  ? Not on file  ?Social History Narrative  ? Lives in [place] with spouse  ? Retired Administrator  ? ?Social Determinants of Health  ? ?Financial Resource Strain: Low Risk   ? Difficulty of Paying Living Expenses: Not hard at all  ?Food Insecurity: No Food Insecurity  ? Worried About Charity fundraiser in the Last Year: Never true  ?  Ran Out of Food in the Last Year: Never true  ?Transportation Needs: No Transportation Needs  ? Lack of Transportation (Medical): No  ? Lack of Transportation (Non-Medical): No  ?Physical Activity: Inactive  ? Days of Exercise per Week: 0 days  ? Minutes of Exercise per Session: 0 min  ?Stress: No Stress Concern Present  ? Feeling of Stress : Not at all  ?Social Connections: Moderately Isolated  ? Frequency of Communication with Friends and Family: More than three times a week  ? Frequency of Social Gatherings with Friends and Family: More than three times a week  ? Attends Religious Services: Never  ? Active Member of Clubs or Organizations: No  ? Attends Archivist Meetings: Never  ? Marital Status: Married  ?Intimate Partner Violence: Not At Risk  ? Fear of Current or Ex-Partner: No  ? Emotionally Abused: No  ? Physically Abused: No  ? Sexually Abused: No  ? ? ?FAMILY HISTORY: Family history has been reviewed by me and is documented elsewhere in the electronic medical record. ? ?CURRENT MEDICATIONS: Current medications have been reviewed by me and are documented elsewhere in electronic medical record. ? ?ALLERGIES: Drug allergies have been reviewed by me and are documented elsewhere in the electronic medical  record. ? ?PHYSICAL EXAM:  ?Performance status (ECOG): 0 - Asymptomatic ?There were no vitals filed for this visit. ?Wt Readings from Last 3 Encounters:  ?12/22/21 205 lb 14.4 oz (93.4 kg)  ?12/16/21 210 lb 12.8

## 2021-12-22 ENCOUNTER — Inpatient Hospital Stay (HOSPITAL_BASED_OUTPATIENT_CLINIC_OR_DEPARTMENT_OTHER): Payer: No Typology Code available for payment source | Admitting: Physician Assistant

## 2021-12-22 ENCOUNTER — Inpatient Hospital Stay (HOSPITAL_COMMUNITY): Payer: No Typology Code available for payment source

## 2021-12-22 VITALS — BP 144/71 | HR 65 | Temp 96.7°F | Resp 20 | Ht 69.0 in | Wt 205.9 lb

## 2021-12-22 DIAGNOSIS — C8510 Unspecified B-cell lymphoma, unspecified site: Secondary | ICD-10-CM

## 2021-12-22 DIAGNOSIS — Z09 Encounter for follow-up examination after completed treatment for conditions other than malignant neoplasm: Secondary | ICD-10-CM

## 2021-12-22 DIAGNOSIS — Z5111 Encounter for antineoplastic chemotherapy: Secondary | ICD-10-CM | POA: Diagnosis not present

## 2021-12-22 LAB — CBC WITH DIFFERENTIAL/PLATELET
Basophils Absolute: 0 10*3/uL (ref 0.0–0.1)
Basophils Relative: 0 %
Eosinophils Absolute: 0.2 10*3/uL (ref 0.0–0.5)
Eosinophils Relative: 3 %
HCT: 40.1 % (ref 39.0–52.0)
Hemoglobin: 13.4 g/dL (ref 13.0–17.0)
Lymphocytes Relative: 5 %
Lymphs Abs: 0.4 10*3/uL — ABNORMAL LOW (ref 0.7–4.0)
MCH: 32.2 pg (ref 26.0–34.0)
MCHC: 33.4 g/dL (ref 30.0–36.0)
MCV: 96.4 fL (ref 80.0–100.0)
Monocytes Absolute: 0 10*3/uL — ABNORMAL LOW (ref 0.1–1.0)
Monocytes Relative: 0 %
Neutro Abs: 6.9 10*3/uL (ref 1.7–7.7)
Neutrophils Relative %: 92 %
Platelets: 82 10*3/uL — ABNORMAL LOW (ref 150–400)
RBC: 4.16 MIL/uL — ABNORMAL LOW (ref 4.22–5.81)
RDW: 12.5 % (ref 11.5–15.5)
Smear Review: DECREASED
WBC: 7.5 10*3/uL (ref 4.0–10.5)
nRBC: 0 % (ref 0.0–0.2)

## 2021-12-22 LAB — COMPREHENSIVE METABOLIC PANEL
ALT: 46 U/L — ABNORMAL HIGH (ref 0–44)
AST: 25 U/L (ref 15–41)
Albumin: 3.6 g/dL (ref 3.5–5.0)
Alkaline Phosphatase: 57 U/L (ref 38–126)
Anion gap: 5 (ref 5–15)
BUN: 38 mg/dL — ABNORMAL HIGH (ref 8–23)
CO2: 28 mmol/L (ref 22–32)
Calcium: 8.7 mg/dL — ABNORMAL LOW (ref 8.9–10.3)
Chloride: 105 mmol/L (ref 98–111)
Creatinine, Ser: 1.13 mg/dL (ref 0.61–1.24)
GFR, Estimated: >60 mL/min (ref >60)
Glucose, Bld: 182 mg/dL — ABNORMAL HIGH (ref 70–99)
Potassium: 4 mmol/L (ref 3.5–5.1)
Sodium: 138 mmol/L (ref 135–145)
Total Bilirubin: 1 mg/dL (ref 0.3–1.2)
Total Protein: 6.3 g/dL — ABNORMAL LOW (ref 6.5–8.1)

## 2021-12-22 LAB — MAGNESIUM: Magnesium: 2.3 mg/dL (ref 1.7–2.4)

## 2021-12-22 MED ORDER — HEPARIN SOD (PORK) LOCK FLUSH 100 UNIT/ML IV SOLN
500.0000 [IU] | Freq: Once | INTRAVENOUS | Status: AC
  Administered 2021-12-22: 500 [IU] via INTRAVENOUS

## 2021-12-22 MED ORDER — SODIUM CHLORIDE 0.9% FLUSH
10.0000 mL | Freq: Once | INTRAVENOUS | Status: AC
  Administered 2021-12-22: 10 mL via INTRAVENOUS

## 2021-12-22 NOTE — Addendum Note (Signed)
Addended by: Charlyne Petrin B on: 12/22/2021 12:02 PM ? ? Modules accepted: Orders ? ?

## 2021-12-22 NOTE — Progress Notes (Signed)
Patients port flushed without difficulty.  Good blood return noted with no bruising or swelling noted at site.  Band aid applied.  VSS with discharge and left in satisfactory condition with no s/s of distress noted.   

## 2021-12-22 NOTE — Patient Instructions (Addendum)
[facility] at [facility] ?Discharge Instructions ? ?You were seen today by Tarri Abernethy PA-C for your chemotherapy follow-up visit.  Overall, you seem to have tolerated your first round of chemotherapy very well!  Continue to eat a healthy diet and drink plenty of fluid at home as you prepare for your next cycle of chemotherapy. ? ?It is important that you take the following medications as prescribed: ?PREDNISONE: Even though this is a pill that you take at home, it is a very important part of your cancer treatment.  Make sure that you are taking it as prescribed - you need to take 5 tablets (100 mg) daily for the first 5 days of each chemotherapy cycle.  (You do not need to be taking your prednisone right now, but you will need to start taking it on 01/06/2022, the same day as your next cycle of chemotherapy) ?ALLOPURINOL: You need to take this medication 300 mg (1 tablet) daily as prescribed.  When chemotherapy kills your cancer cells, it can release certain toxins (uric acid) in your bloodstream.  Your allopurinol medication helps your body to get rid of this extra uric acid. ? ?FOLLOW-UP APPOINTMENT: You are scheduled for follow-up visit, labs, and next cycle of chemotherapy on 01/06/2022 ? ?  - - - - - - - - - - - - - - - ? ?Thank you for choosing [facility] at [facility] to provide your oncology and hematology care.  To afford each patient quality time with our provider, please arrive at least 15 minutes before your scheduled appointment time.  ? ?If you have a lab appointment with the [facility] please come in thru the Main Entrance and check in at the main information desk. ? ?You need to re-schedule your appointment should you arrive 10 or more minutes late.  We strive to give you quality time with our providers, and arriving late affects you and other patients whose appointments are after yours.  Also, if you no show three or more times for  appointments you may be dismissed from the clinic at the providers discretion.     ?Again, thank you for choosing [facility].  Our hope is that these requests will decrease the amount of time that you wait before being seen by our physicians.       ?_____________________________________________________________ ? ?Should you have questions after your visit to [facility], please contact our office at [phone] and follow the prompts.  Our office hours are 8:00 a.m. and 4:30 p.m. Monday - Friday.  Please note that voicemails left after 4:00 p.m. may not be returned until the following business day.  We are closed weekends and major holidays.  You do have access to a nurse 24-7, just call the main number to the clinic [phone] and do not press any options, hold on the line and a nurse will answer the phone.   ? ?For prescription refill requests, have your pharmacy contact our office and allow 72 hours.   ? ?Due to Covid, you will need to wear a mask upon entering the hospital. If you do not have a mask, a mask will be given to you at the Main Entrance upon arrival. For doctor visits, patients may have 1 support person age 96 or older with them. For treatment visits, patients can not have anyone with them due to social distancing guidelines and our immunocompromised population.  ? ? ? ?

## 2021-12-23 ENCOUNTER — Encounter (HOSPITAL_COMMUNITY): Payer: Self-pay

## 2021-12-24 ENCOUNTER — Encounter: Payer: Self-pay | Admitting: Internal Medicine

## 2021-12-24 ENCOUNTER — Ambulatory Visit (INDEPENDENT_AMBULATORY_CARE_PROVIDER_SITE_OTHER): Payer: Medicare HMO | Admitting: Internal Medicine

## 2021-12-24 DIAGNOSIS — E559 Vitamin D deficiency, unspecified: Secondary | ICD-10-CM | POA: Diagnosis not present

## 2021-12-24 DIAGNOSIS — I10 Essential (primary) hypertension: Secondary | ICD-10-CM | POA: Diagnosis not present

## 2021-12-24 DIAGNOSIS — S61217D Laceration without foreign body of left little finger without damage to nail, subsequent encounter: Secondary | ICD-10-CM

## 2021-12-24 DIAGNOSIS — R739 Hyperglycemia, unspecified: Secondary | ICD-10-CM

## 2021-12-24 DIAGNOSIS — S61219A Laceration without foreign body of unspecified finger without damage to nail, initial encounter: Secondary | ICD-10-CM | POA: Insufficient documentation

## 2021-12-24 NOTE — Patient Instructions (Signed)
Your stitches were removed today ? ?Ok to finish the antibiotic ? ?Your Tdap is up to date ? ?Please continue all other medications as before, and refills have been done if requested. ? ?Please have the pharmacy call with any other refills you may need. ? ?Please keep your appointments with your specialists as you may have planned ? ?Good luck with the dog training! ? ? ? ?

## 2021-12-24 NOTE — Assessment & Plan Note (Signed)
Lab Results  ?Component Value Date  ? HGBA1C 6.1 10/09/2021  ? ?Stable, pt to continue current medical treatment  - diet ? ?

## 2021-12-24 NOTE — Assessment & Plan Note (Signed)
Last vitamin D ?Lab Results  ?Component Value Date  ? VD25OH 51.54 10/09/2021  ? ?Stable, cont oral replacement ? ?

## 2021-12-24 NOTE — Progress Notes (Signed)
Patient ID: [name] [name], male   DOB: 03-30-1946, 76 y.o.   MRN: [id number] ? ? ? ?    Chief Complaint: follow up dog bite to distal left 5th finger ? ?     HPI:  [name] [name] is a 76 y.o. male here with c/o accidental puppy bite to distal left 5th finger on may 9, seen at ED, 3 stitches required to close the wound, Tdap already up to date, and given preventive augmentin which he has been compliant and tolerated well.  Wound today appears nearly closed except slight opening at the most proximal aspect, but no s/s infection or bleeding.  Distal finger bruising related to coumadin has mostly resolved.  No new complaints  Pt denies chest pain, increased sob or doe, wheezing, orthopnea, PND, increased LE swelling, palpitations, dizziness or syncope.   Pt denies polydipsia, polyuria, or new focal neuro s/s.  Puppy sent for 3 wk training.  ?      ?Wt Readings from Last 3 Encounters:  ?12/24/21 201 lb (91.2 kg)  ?12/22/21 205 lb 14.4 oz (93.4 kg)  ?12/16/21 210 lb 12.8 oz (95.6 kg)  ? ?BP Readings from Last 3 Encounters:  ?12/24/21 120/68  ?12/22/21 (!) 144/71  ?12/19/21 (!) 142/74  ? ?      ?Past Medical History:  ?Diagnosis Date  ? ALLERGIC RHINITIS 10/31/2007  ? Qualifier: Diagnosis of  By: Jenny Reichmann MD, Hunt Oris   ? Allergy   ? CHOLECYSTECTOMY, HX OF 06/04/2007  ? Qualifier: Diagnosis of  By: [place], [place]    ? COLONIC POLYPS, HX OF 10/31/2007  ? Qualifier: Diagnosis of  By: Jenny Reichmann MD, Hunt Oris   ? CORONARY ARTERY BYPASS GRAFT, HX OF 06/04/2007  ? Qualifier: Diagnosis of  By: [place], [place]    ? CORONARY ARTERY DISEASE 06/04/2007  ? Qualifier: Diagnosis of  By: [place], [place]    ? Diverticulosis   ? Erectile dysfunction   ? Eye twitch   ? right eye since chilhood   ? HYPERLIPIDEMIA 06/04/2007  ? Qualifier: Diagnosis of  By: [place], [place]    ? Hypersomnolence   ? HYPERTENSION 06/04/2007  ? Qualifier: Diagnosis of  By: [place], [place]    ? Hypothyroidism   ? ISCHEMIC CARDIOMYOPATHY 06/04/2007  ?  Qualifier: Diagnosis of  By: [place], [place]    ? Myocardial infarction [facility])   ? per patient , his occurred in 1998   ? Osteoarthritis   ? PLMD (periodic limb movement disorder)   ? Sleep apnea   ? no cpap' per patient , "i was checked for it and they said i didnt have it "   ? Vertigo   ? Vitamin D deficiency   ? ?Past Surgical History:  ?Procedure Laterality Date  ? BIOPSY  11/25/2021  ? Procedure: BIOPSY;  Surgeon: Jackquline Denmark, MD;  Location: [facility];  Service: Gastroenterology;;  ? CHOLECYSTECTOMY    ? COLONOSCOPY    ? COLONOSCOPY WITH PROPOFOL N/A 11/25/2021  ? Procedure: COLONOSCOPY WITH PROPOFOL;  Surgeon: Jackquline Denmark, MD;  Location: [facility];  Service: Gastroenterology;  Laterality: N/A;  ? CORONARY ARTERY BYPASS GRAFT    ? ESOPHAGOGASTRODUODENOSCOPY (EGD) WITH PROPOFOL N/A 11/25/2021  ? Procedure: ESOPHAGOGASTRODUODENOSCOPY (EGD) WITH PROPOFOL;  Surgeon: Jackquline Denmark, MD;  Location: [facility];  Service: Gastroenterology;  Laterality: N/A;  ? ICD IMPLANT N/A 01/30/2020  ? Procedure: ICD IMPLANT;  Surgeon: Thompson Grayer, MD;  Location: [facility];  Service:  Cardiovascular;  Laterality: N/A;  ? IR IMAGING GUIDED PORT INSERTION  12/04/2021  ? KNEE ARTHROPLASTY Left 08/05/2017  ? Procedure: LEFT TOTAL KNEE ARTHROPLASTY WITH COMPUTER NAVIGATION;  Surgeon: Rod Can, MD;  Location: [facility];  Service: Orthopedics;  Laterality: Left;  Needs RNFA  ? PENILE PROSTHESIS IMPLANT    ? POLYPECTOMY  11/25/2021  ? Procedure: POLYPECTOMY;  Surgeon: Jackquline Denmark, MD;  Location: [facility];  Service: Gastroenterology;;  ? REPLACEMENT TOTAL KNEE  06/12/2013  ? spinal cyst    ? THORACOTOMY    ? left anterior; wound exploration and debridement  ? TONSILLECTOMY AND ADENOIDECTOMY    ? age 32  ? ? reports that he quit smoking about 40 years ago. His smoking use included cigarettes. He has a 68.00 pack-year smoking history. He has never used smokeless tobacco. He reports current alcohol use. He reports  that he does not use drugs. ?family history includes COPD in his son; Colon cancer in his maternal grandfather; Heart disease in his brother; Lung cancer in his father; Pancreatic cancer in his brother, cousin, and maternal uncle; Stomach cancer in his mother. ?Allergies  ?Allergen Reactions  ? Ace Inhibitors Cough  ? Statins Other (See Comments)  ?  Muscle pains  ? ?Current Outpatient Medications on File Prior to Visit  ?Medication Sig Dispense Refill  ? allopurinol (ZYLOPRIM) 300 MG tablet Take 1 tablet (300 mg total) by mouth daily. 30 tablet 3  ? amoxicillin-clavulanate (AUGMENTIN) 875-125 MG tablet Take 1 tablet by mouth every 12 (twelve) hours. 14 tablet 0  ? ARTIFICIAL TEAR SOLUTION OP Place 1 drop into both eyes daily as needed (dry eyes).    ? Ascorbic Acid (VITA-C PO) Take 1,000 mg by mouth daily.    ? b complex vitamins capsule Take 1 capsule by mouth daily.    ? empagliflozin (JARDIANCE) 10 MG TABS tablet Take 1 tablet (10 mg total) by mouth daily before breakfast. 30 tablet 11  ? ezetimibe (ZETIA) 10 MG tablet TAKE 1 TABLET EVERY DAY (Patient taking differently: Take by mouth every other day.) 90 tablet 3  ? lidocaine-prilocaine (EMLA) cream Apply a small amount to port a cath site (do not rub in) and cover with plastic wrap 1 hour prior to infusion appointments 30 g 3  ? metoprolol succinate (TOPROL-XL) 50 MG 24 hr tablet TAKE 1 TABLET DAILY. TAKE WITH OR IMMEDIATELY FOLLOWING A MEAL. 90 tablet 1  ? Multiple Vitamins-Minerals (MULTIVITAMIN WITH MINERALS) tablet Take 1 tablet by mouth daily. Centrum silver    ? pantoprazole (PROTONIX) 40 MG tablet Take 1 tablet (40 mg total) by mouth daily. 90 tablet 3  ? predniSONE (DELTASONE) 20 MG tablet Take 5 tablets (100 mg) by mouth daily starting on the first day each treatment and the four days following. DO NOT take on weeks you are not receiving treatment 150 tablet 0  ? prochlorperazine (COMPAZINE) 10 MG tablet Take 1 tablet (10 mg total) by mouth every 6  (six) hours as needed for nausea or vomiting. 30 tablet 3  ? rosuvastatin (CRESTOR) 5 MG tablet TAKE 1 TABLET EVERY OTHER DAY 45 tablet 10  ? triamcinolone (NASACORT) 55 MCG/ACT AERO nasal inhaler Place 2 sprays into the nose daily. (Patient taking differently: Place 2 sprays into the nose daily as needed (allergies).) 3 Inhaler 3  ? valsartan (DIOVAN) 160 MG tablet Take 1 tablet (160 mg total) by mouth daily. 90 tablet 3  ? VITAMIN D PO Take 1,000 Units by mouth daily.    ?  warfarin (COUMADIN) 5 MG tablet TAKE 1 TABLET EVERY DAY AT 4 PM (Patient taking differently: Take 2.5-5 mg by mouth See admin instructions. Taking 5 mg every day except on Friday taking 1/2 tablet = 2.5 mg) 90 tablet 4  ? spironolactone (ALDACTONE) 25 MG tablet Take 0.5 tablets (12.5 mg total) by mouth daily. (Patient taking differently: Take 25 mg by mouth daily.) 45 tablet 3  ? ?No current facility-administered medications on file prior to visit.  ? ?     ROS:  All others reviewed and negative. ? ?Objective  ? ?     PE:  BP 120/68 (BP Location: Left Arm, Patient Position: Sitting, Cuff Size: Large)   Pulse 68   Temp 98.3 ?F (36.8 ?C) (Oral)   Ht '5\' 9"'$  (1.753 m)   Wt 201 lb (91.2 kg)   SpO2 96%   BMI 29.68 kg/m?  ? ?              Constitutional: Pt appears in NAD ?              HENT: Head: NCAT.  ?              Right Ear: External ear normal.   ?              Left Ear: External ear normal.  ?              Eyes: . Pupils are equal, round, and reactive to light. Conjunctivae and EOM are normal ?              Nose: without d/c or deformity ?              Neck: Neck supple. Gross normal ROM ?              Cardiovascular: Normal rate and regular rhythm.   ?              Pulmonary/Chest: Effort normal and breath sounds without rales or wheezing.  ?               ?              Neurological: Pt is alert. At baseline orientation, motor grossly intact ?              Skin:LE edema - nonem left 5th finger with intact wound, no cellultiis, 3 stitches  removed o/w neurovasc intact ?              Psychiatric: Pt behavior is normal without agitation  ? ?Micro: none ? ?Cardiac tracings I have personally interpreted today:  none ? ?Pertinent Radiological findin

## 2021-12-24 NOTE — Assessment & Plan Note (Signed)
BP Readings from Last 3 Encounters:  ?12/24/21 120/68  ?12/22/21 (!) 144/71  ?12/19/21 (!) 142/74  ? ?Stable, pt to continue medical treatment toprol, diovan ? ?

## 2021-12-24 NOTE — Assessment & Plan Note (Signed)
No cellulitis, wound intact, stitches removed, no further eval or tx needed, to finish antibx as rx ?

## 2021-12-26 ENCOUNTER — Other Ambulatory Visit (HOSPITAL_BASED_OUTPATIENT_CLINIC_OR_DEPARTMENT_OTHER): Payer: Self-pay | Admitting: Student

## 2021-12-26 ENCOUNTER — Encounter (HOSPITAL_COMMUNITY): Payer: Self-pay | Admitting: Hematology

## 2021-12-26 ENCOUNTER — Other Ambulatory Visit (HOSPITAL_COMMUNITY): Payer: Self-pay

## 2021-12-26 ENCOUNTER — Inpatient Hospital Stay (HOSPITAL_COMMUNITY): Payer: No Typology Code available for payment source

## 2021-12-26 DIAGNOSIS — C8510 Unspecified B-cell lymphoma, unspecified site: Secondary | ICD-10-CM

## 2021-12-26 DIAGNOSIS — Z5111 Encounter for antineoplastic chemotherapy: Secondary | ICD-10-CM | POA: Diagnosis not present

## 2021-12-26 DIAGNOSIS — I739 Peripheral vascular disease, unspecified: Secondary | ICD-10-CM

## 2021-12-26 DIAGNOSIS — R3989 Other symptoms and signs involving the genitourinary system: Secondary | ICD-10-CM

## 2021-12-26 LAB — URINALYSIS, ROUTINE W REFLEX MICROSCOPIC
Bacteria, UA: NONE SEEN
Bilirubin Urine: NEGATIVE
Glucose, UA: >=500 mg/dL — AB
Hgb urine dipstick: NEGATIVE
Ketones, ur: NEGATIVE mg/dL
Leukocytes,Ua: NEGATIVE
Nitrite: NEGATIVE
Protein, ur: NEGATIVE mg/dL
Specific Gravity, Urine: 1.035 — ABNORMAL HIGH (ref 1.005–1.030)
pH: 5 (ref 5.0–8.0)

## 2021-12-26 MED ORDER — AMOXICILLIN-POT CLAVULANATE 875-125 MG PO TABS: 1.0000 | ORAL_TABLET | Freq: Two times a day (BID) | ORAL | 0 refills | Status: DC

## 2021-12-26 NOTE — Progress Notes (Signed)
Patient called reporting dark urine. No other symptoms noted. Patient scheduled for lab appt to get UA and urine culture per Dr. Delton Coombes

## 2021-12-27 LAB — URINE CULTURE: Culture: NO GROWTH

## 2021-12-29 ENCOUNTER — Telehealth (HOSPITAL_COMMUNITY): Payer: Self-pay | Admitting: *Deleted

## 2021-12-29 ENCOUNTER — Ambulatory Visit (INDEPENDENT_AMBULATORY_CARE_PROVIDER_SITE_OTHER): Payer: No Typology Code available for payment source

## 2021-12-29 ENCOUNTER — Telehealth: Payer: Self-pay

## 2021-12-29 DIAGNOSIS — I5042 Chronic combined systolic (congestive) and diastolic (congestive) heart failure: Secondary | ICD-10-CM

## 2021-12-29 DIAGNOSIS — Z95810 Presence of automatic (implantable) cardiac defibrillator: Secondary | ICD-10-CM

## 2021-12-29 NOTE — Telephone Encounter (Signed)
Patient called stating that he has had difficulty having a bowel movement over the past few days.  States only having small amounts of hard stool at a time.  Advised to use suppository today and pair with Miralax and a lot of water.  Will call in am if no results.

## 2021-12-29 NOTE — Progress Notes (Signed)
EPIC Encounter for ICM Monitoring  Patient Name: [name] [name] is a 76 y.o. male Date: 12/29/2021 Primary Care Physican: Biagio Borg, MD Primary Cardiologist: [name] Electrophysiologist: Allred 09/10/2021 Weight:   206 lbs 10/07/2021 Weight: 206 lbs (does not weight at home) 12/29/2021 Weight: 201 lbs                                                            Spoke with patient and heart failure questions reviewed.  Pt asymptomatic for fluid accumulation.  Pt is taking Chemo for stomach cancer.  He is taking Furosemide as scheduled daily.    Optivol thoracic impedance suggesting possible fluid accumulation 4/5.    Fluid index > normal threshold starting 2/22.   Prescribed: Furosemide 20 mg take 1 tablet(s) by mouth daily   Jardiance 10 mg take 1 tablet before breakfast   Labs: 12/15/2021 Creatinine 1.37, BUN 29, Potassium 4.5, Sodium 138, GFR 53 12/02/2021 Creatinine 1.60, BUN 26, Potassium 4.7, Sodium 138, GFR 44  11/13/2021 Creatinine 1.79, BUN 33, Potassium 4.2, Sodium 137, GFR 39  10/24/2021 Creatinine 1.41, BUN 30, Potassium 4.9, Sodium 140, GFR 52 10/09/2021 Creatinine 1.45, BUN 27, Potassium 4.3, Sodium 137 A complete set of results can be found in Results Review.   Recommendations:  No changes and encouraged to call if experiencing any fluid symptoms.   Follow-up plan: ICM clinic phone appointment on 01/19/2022.   91 day device clinic remote transmission 01/27/2022.     EP/Cardiology Office Visits: 03/03/2022 with Dr [name].  12/16/2021 with Oda Kilts, [place].   Copy of ICM check sent to Dr. Rayann Heman   3 month ICM trend: 12/29/2021.    12-14 Month ICM trend:     Rosalene Billings, RN 12/29/2021 1:10 PM

## 2021-12-29 NOTE — Telephone Encounter (Signed)
Patient is requesting a refill on his Lasix 20 mg tablet once daily to [facility]. I do see in the notes when the patient last seen Barrington Ellison he was placed on lasix but it doesn't look like the medication was sent into his pharmacy because it's not on his med list. Please review this information, thanks!

## 2021-12-30 ENCOUNTER — Other Ambulatory Visit: Payer: Self-pay

## 2021-12-30 MED ORDER — FUROSEMIDE 20 MG PO TABS: 20.0000 mg | ORAL_TABLET | Freq: Every day | ORAL | 3 refills | Status: DC

## 2022-01-01 ENCOUNTER — Ambulatory Visit (INDEPENDENT_AMBULATORY_CARE_PROVIDER_SITE_OTHER): Payer: No Typology Code available for payment source | Admitting: *Deleted

## 2022-01-01 DIAGNOSIS — I240 Acute coronary thrombosis not resulting in myocardial infarction: Secondary | ICD-10-CM | POA: Diagnosis not present

## 2022-01-01 DIAGNOSIS — Z5181 Encounter for therapeutic drug level monitoring: Secondary | ICD-10-CM | POA: Diagnosis not present

## 2022-01-01 LAB — POCT INR: INR: 1.9 — AB (ref 2.0–3.0)

## 2022-01-01 NOTE — Patient Instructions (Signed)
Description   Pt started chemo 12/15/2021 Pt take Prednisone 100 mg on the first 5 days of chemo treatment.  -Take 1.5 tablets of warfarin today -Then continue to take warfarin 1 tablet daily except 1/2 tablet on Mondays, Wednesdays and Fridays Recheck on 6/1.

## 2022-01-02 ENCOUNTER — Encounter (HOSPITAL_COMMUNITY): Payer: Self-pay | Admitting: Hematology

## 2022-01-06 ENCOUNTER — Inpatient Hospital Stay (HOSPITAL_COMMUNITY): Payer: No Typology Code available for payment source

## 2022-01-06 ENCOUNTER — Inpatient Hospital Stay (HOSPITAL_BASED_OUTPATIENT_CLINIC_OR_DEPARTMENT_OTHER): Payer: No Typology Code available for payment source | Admitting: Hematology

## 2022-01-06 VITALS — BP 97/69 | HR 66 | Temp 97.1°F | Resp 16

## 2022-01-06 DIAGNOSIS — C8510 Unspecified B-cell lymphoma, unspecified site: Secondary | ICD-10-CM | POA: Diagnosis not present

## 2022-01-06 DIAGNOSIS — R7989 Other specified abnormal findings of blood chemistry: Secondary | ICD-10-CM

## 2022-01-06 DIAGNOSIS — Z5111 Encounter for antineoplastic chemotherapy: Secondary | ICD-10-CM | POA: Diagnosis not present

## 2022-01-06 LAB — COMPREHENSIVE METABOLIC PANEL
ALT: 19 U/L (ref 0–44)
AST: 23 U/L (ref 15–41)
Albumin: 3.9 g/dL (ref 3.5–5.0)
Alkaline Phosphatase: 78 U/L (ref 38–126)
Anion gap: 8 (ref 5–15)
BUN: 30 mg/dL — ABNORMAL HIGH (ref 8–23)
CO2: 26 mmol/L (ref 22–32)
Calcium: 9.3 mg/dL (ref 8.9–10.3)
Chloride: 103 mmol/L (ref 98–111)
Creatinine, Ser: 1.64 mg/dL — ABNORMAL HIGH (ref 0.61–1.24)
GFR, Estimated: 43 mL/min — ABNORMAL LOW (ref >60)
Glucose, Bld: 145 mg/dL — ABNORMAL HIGH (ref 70–99)
Potassium: 4.4 mmol/L (ref 3.5–5.1)
Sodium: 137 mmol/L (ref 135–145)
Total Bilirubin: 0.9 mg/dL (ref 0.3–1.2)
Total Protein: 7.1 g/dL (ref 6.5–8.1)

## 2022-01-06 LAB — CBC WITH DIFFERENTIAL/PLATELET
Abs Immature Granulocytes: 0.07 10*3/uL (ref 0.00–0.07)
Basophils Absolute: 0.1 10*3/uL (ref 0.0–0.1)
Basophils Relative: 1 %
Eosinophils Absolute: 0.1 10*3/uL (ref 0.0–0.5)
Eosinophils Relative: 1 %
HCT: 41 % (ref 39.0–52.0)
Hemoglobin: 13.6 g/dL (ref 13.0–17.0)
Immature Granulocytes: 1 %
Lymphocytes Relative: 11 %
Lymphs Abs: 1.1 10*3/uL (ref 0.7–4.0)
MCH: 31.7 pg (ref 26.0–34.0)
MCHC: 33.2 g/dL (ref 30.0–36.0)
MCV: 95.6 fL (ref 80.0–100.0)
Monocytes Absolute: 0.3 10*3/uL (ref 0.1–1.0)
Monocytes Relative: 3 %
Neutro Abs: 7.9 10*3/uL — ABNORMAL HIGH (ref 1.7–7.7)
Neutrophils Relative %: 83 %
Platelets: 166 10*3/uL (ref 150–400)
RBC: 4.29 MIL/uL (ref 4.22–5.81)
RDW: 13.6 % (ref 11.5–15.5)
WBC: 9.5 10*3/uL (ref 4.0–10.5)
nRBC: 0 % (ref 0.0–0.2)

## 2022-01-06 LAB — URIC ACID: Uric Acid, Serum: 4.4 mg/dL (ref 3.7–8.6)

## 2022-01-06 LAB — LACTATE DEHYDROGENASE: LDH: 150 U/L (ref 98–192)

## 2022-01-06 LAB — PHOSPHORUS: Phosphorus: 3.4 mg/dL (ref 2.5–4.6)

## 2022-01-06 LAB — MAGNESIUM: Magnesium: 2.2 mg/dL (ref 1.7–2.4)

## 2022-01-06 MED ORDER — SODIUM CHLORIDE 0.9 % IV SOLN: Freq: Once | INTRAVENOUS | Status: AC

## 2022-01-06 MED ORDER — SODIUM CHLORIDE 0.9 % IV SOLN
50.0000 mg/m2 | Freq: Once | INTRAVENOUS | Status: AC
  Administered 2022-01-06: 110 mg via INTRAVENOUS
  Filled 2022-01-06: qty 5.5

## 2022-01-06 MED ORDER — SODIUM CHLORIDE 0.9% FLUSH
10.0000 mL | INTRAVENOUS | Status: DC | PRN
  Administered 2022-01-06: 10 mL

## 2022-01-06 MED ORDER — SODIUM CHLORIDE 0.9 % IV SOLN
375.0000 mg/m2 | Freq: Once | INTRAVENOUS | Status: AC
  Administered 2022-01-06: 800 mg via INTRAVENOUS
  Filled 2022-01-06: qty 50

## 2022-01-06 MED ORDER — FAMOTIDINE IN NACL 20-0.9 MG/50ML-% IV SOLN
20.0000 mg | Freq: Once | INTRAVENOUS | Status: AC
  Administered 2022-01-06: 20 mg via INTRAVENOUS
  Filled 2022-01-06: qty 50

## 2022-01-06 MED ORDER — SODIUM CHLORIDE 0.9 % IV SOLN
750.0000 mg/m2 | Freq: Once | INTRAVENOUS | Status: AC
  Administered 2022-01-06: 1620 mg via INTRAVENOUS
  Filled 2022-01-06: qty 81

## 2022-01-06 MED ORDER — DIPHENHYDRAMINE HCL 25 MG PO CAPS
50.0000 mg | ORAL_CAPSULE | Freq: Once | ORAL | Status: AC
  Administered 2022-01-06: 50 mg via ORAL
  Filled 2022-01-06: qty 2

## 2022-01-06 MED ORDER — HEPARIN SOD (PORK) LOCK FLUSH 100 UNIT/ML IV SOLN
500.0000 [IU] | Freq: Once | INTRAVENOUS | Status: AC | PRN
  Administered 2022-01-06: 500 [IU]

## 2022-01-06 MED ORDER — SODIUM CHLORIDE 0.9 % IV SOLN
10.0000 mg | Freq: Once | INTRAVENOUS | Status: AC
  Administered 2022-01-06: 10 mg via INTRAVENOUS
  Filled 2022-01-06: qty 10

## 2022-01-06 MED ORDER — VINCRISTINE SULFATE CHEMO INJECTION 1 MG/ML
2.0000 mg | Freq: Once | INTRAVENOUS | Status: AC
  Administered 2022-01-06: 2 mg via INTRAVENOUS
  Filled 2022-01-06: qty 2

## 2022-01-06 MED ORDER — PALONOSETRON HCL INJECTION 0.25 MG/5ML
0.2500 mg | Freq: Once | INTRAVENOUS | Status: AC
  Administered 2022-01-06: 0.25 mg via INTRAVENOUS
  Filled 2022-01-06: qty 5

## 2022-01-06 MED ORDER — ACETAMINOPHEN 325 MG PO TABS
650.0000 mg | ORAL_TABLET | Freq: Once | ORAL | Status: AC
  Administered 2022-01-06: 650 mg via ORAL
  Filled 2022-01-06: qty 2

## 2022-01-06 NOTE — Progress Notes (Signed)
Serum creatinine 1.64 today.  Oncologist aware.  See oncology note for orders.   Patient tolerated chemotherapy with no complaints voiced.  Side effects with management reviewed with understanding verbalized.  Port site clean and dry with no bruising or swelling noted at site.  Good blood return noted before and after administration of chemotherapy.  Dressing intact.  Patient left in satisfactory condition with VSS and no s/s of distress noted.

## 2022-01-06 NOTE — Progress Notes (Signed)
Patient has been examined by Dr. Delton Coombes, and vital signs and labs have been reviewed. ANC, Creatinine, LFTs, hemoglobin, and platelets are within treatment parameters per M.D. - pt may proceed with treatment.   We will give additional IVF NS 1 L over 2 hours for creatinine of 1.64 per Dr. Delton Coombes.

## 2022-01-06 NOTE — Patient Instructions (Signed)
[facility]  Discharge Instructions: Thank you for choosing [facility] to provide your oncology and hematology care.  If you have a lab appointment with the [facility], please come in thru the Main Entrance and check in at the main information desk.  Wear comfortable clothing and clothing appropriate for easy access to any Portacath or PICC line.   We strive to give you quality time with your provider. You may need to reschedule your appointment if you arrive late (15 or more minutes).  Arriving late affects you and other patients whose appointments are after yours.  Also, if you miss three or more appointments without notifying the office, you may be dismissed from the clinic at the provider's discretion.      For prescription refill requests, have your pharmacy contact our office and allow 72 hours for refills to be completed.    Today you received the following chemotherapy and/or immunotherapy agents rituxan, cytoxan, etoposide, and vincristine.   Vincristine injection What is this medication? VINCRISTINE (vin KRIS teen) is a chemotherapy drug. It slows the growth of cancer cells. This medicine is used to treat many types of cancer like Hodgkin's disease, leukemia, non-Hodgkin's lymphoma, neuroblastoma (brain cancer), rhabdomyosarcoma, and Wilms' tumor. This medicine may be used for other purposes; ask your health care provider or pharmacist if you have questions. COMMON BRAND NAME(S): Oncovin, Vincasar PFS What should I tell my care team before I take this medication? They need to know if you have any of these conditions: blood disorders gout infection (especially chickenpox, cold sores, or herpes) kidney disease liver disease lung disease nervous system disease like Charcot-Marie-Tooth (CMT) recent or ongoing radiation therapy an unusual or allergic reaction to vincristine, other chemotherapy agents, other medicines, foods, dyes, or  preservatives pregnant or trying to get pregnant breast-feeding How should I use this medication? This drug is given as an infusion into a vein. It is administered in a hospital or clinic by a specially trained health care professional. If you have pain, swelling, burning, or any unusual feeling around the site of your injection, tell your health care professional right away. Talk to your pediatrician regarding the use of this medicine in children. While this drug may be prescribed for selected conditions, precautions do apply. Overdosage: If you think you have taken too much of this medicine contact a poison control center or emergency room at once. NOTE: This medicine is only for you. Do not share this medicine with others. What if I miss a dose? It is important not to miss your dose. Call your doctor or health care professional if you are unable to keep an appointment. What may interact with this medication? certain medicines for fungal infections like itraconazole, ketoconazole, posaconazole, voriconazole certain medicines for seizures like phenytoin This list may not describe all possible interactions. Give your health care provider a list of all the medicines, herbs, non-prescription drugs, or dietary supplements you use. Also tell them if you smoke, drink alcohol, or use illegal drugs. Some items may interact with your medicine. What should I watch for while using this medication? This drug may make you feel generally unwell. This is not uncommon, as chemotherapy can affect healthy cells as well as cancer cells. Report any side effects. Continue your course of treatment even though you feel ill unless your doctor tells you to stop. You may need blood work done while you are taking this medicine. This medicine will cause constipation. Try to have a bowel  movement at least every 2 to 3 days. If you do not have a bowel movement for 3 days, call your doctor or health care professional. In some  cases, you may be given additional medicines to help with side effects. Follow all directions for their use. Do not become pregnant while taking this medicine. Women should inform their doctor if they wish to become pregnant or think they might be pregnant. There is a potential for serious side effects to an unborn child. Talk to your health care professional or pharmacist for more information. Do not breast-feed an infant while taking this medicine. This medicine may make it more difficult to get pregnant or to father a child. Talk to your healthcare professional if you are concerned about your fertility. What side effects may I notice from receiving this medication? Side effects that you should report to your doctor or health care professional as soon as possible: allergic reactions like skin rash, itching or hives, swelling of the face, lips, or tongue breathing problems confusion or changes in emotions or moods constipation cough mouth sores muscle weakness nausea and vomiting pain, swelling, redness or irritation at the injection site pain, tingling, numbness in the hands or feet problems with balance, talking, walking seizures stomach pain trouble passing urine or change in the amount of urine Side effects that usually do not require medical attention (report to your doctor or health care professional if they continue or are bothersome): diarrhea hair loss jaw pain loss of appetite This list may not describe all possible side effects. Call your doctor for medical advice about side effects. You may report side effects to FDA at 1-800-FDA-1088. Where should I keep my medication? This drug is given in a hospital or clinic and will not be stored at home. NOTE: This sheet is a summary. It may not cover all possible information. If you have questions about this medicine, talk to your doctor, pharmacist, or health care provider.  2023 Elsevier/Gold Standard (2021-06-27  00:00:00) Etoposide, VP-16 capsules What is this medication? ETOPOSIDE, VP-16 (e toe POE side) is a chemotherapy drug. It is used to treat small cell lung cancer and other cancers. This medicine may be used for other purposes; ask your health care provider or pharmacist if you have questions. COMMON BRAND NAME(S): VePesid What should I tell my care team before I take this medication? They need to know if you have any of these conditions: infection kidney disease liver disease low blood counts, like low white cell, platelet, or red cell counts an unusual or allergic reaction to etoposide, other medicines, foods, dyes, or preservatives pregnant or trying to get pregnant breast-feeding How should I use this medication? Take this medicine by mouth with a glass of water. Follow the directions on the prescription label. Do not open, crush, or chew the capsules. It is advisable to wear gloves when handling this medicine. Take your medicine at regular intervals. Do not take it more often than directed. Do not stop taking except on your doctor's advice. Talk to your pediatrician regarding the use of this medicine in children. Special care may be needed. Overdosage: If you think you have taken too much of this medicine contact a poison control center or emergency room at once. NOTE: This medicine is only for you. Do not share this medicine with others. What if I miss a dose? If you miss a dose, take it as soon as you can. If it is almost time for your next dose, take only  that dose. Do not take double or extra doses. What may interact with this medication? This medicine may interact with the following medications: cyclosporine warfarin This list may not describe all possible interactions. Give your health care provider a list of all the medicines, herbs, non-prescription drugs, or dietary supplements you use. Also tell them if you smoke, drink alcohol, or use illegal drugs. Some items may interact  with your medicine. What should I watch for while using this medication? Visit your doctor for checks on your progress. This drug may make you feel generally unwell. This is not uncommon, as chemotherapy can affect healthy cells as well as cancer cells. Report any side effects. Continue your course of treatment even though you feel ill unless your doctor tells you to stop. In some cases, you may be given additional medicines to help with side effects. Follow all directions for their use. Call your doctor or health care professional for advice if you get a fever, chills or sore throat, or other symptoms of a cold or flu. Do not treat yourself. This drug decreases your body's ability to fight infections. Try to avoid being around people who are sick. This medicine may increase your risk to bruise or bleed. Call your doctor or health care professional if you notice any unusual bleeding. Talk to your doctor about your risk of cancer. You may be more at risk for certain types of cancers if you take this medicine. Do not become pregnant while taking this medicine or for at least 6 months after stopping it. Women should inform their doctor if they wish to become pregnant or think they might be pregnant. Women of child-bearing potential will need to have a negative pregnancy test before starting this medicine. There is a potential for serious side effects to an unborn child. Talk to your health care professional or pharmacist for more information. Do not breast-feed an infant while taking this medicine. Men must use a latex condom during sexual contact with a woman while taking this medicine and for at least 4 months after stopping it. A latex condom is needed even if you have had a vasectomy. Contact your doctor right away if your partner becomes pregnant. Do not donate sperm while taking this medicine and for 4 months after you stop taking this medicine. Men should inform their doctors if they wish to father a  child. This medicine may lower sperm counts. What side effects may I notice from receiving this medication? Side effects that you should report to your doctor or health care professional as soon as possible: allergic reactions like skin rash, itching or hives, swelling of the face, lips, or tongue low blood counts - this medicine may decrease the number of white blood cells, red blood cells, and platelets. You may be at increased risk for infections and bleeding nausea, vomiting redness, blistering, peeling or loosening of the skin, including inside the mouth signs and symptoms of infection like fever; chills; cough; sore throat; pain or trouble passing urine signs and symptoms of low red blood cells or anemia such as unusually weak or tired; feeling faint or lightheaded; falls; breathing problems unusual bruising or bleeding Side effects that usually do not require medical attention (report to your doctor or health care professional if they continue or are bothersome): changes in taste diarrhea hair loss loss of appetite mouth sores This list may not describe all possible side effects. Call your doctor for medical advice about side effects. You may report side effects  to FDA at 1-800-FDA-1088. Where should I keep my medication? Keep out of the reach of children. Store in a refrigerator between 2 and 8 degrees C (36 and 46 degrees F). Do not freeze. Throw away any unused medicine after the expiration date. NOTE: This sheet is a summary. It may not cover all possible information. If you have questions about this medicine, talk to your doctor, pharmacist, or health care provider.  2023 Elsevier/Gold Standard (2021-06-27 00:00:00) Cyclophosphamide Injection What is this medication? CYCLOPHOSPHAMIDE (sye kloe FOSS fa mide) is a chemotherapy drug. It slows the growth of cancer cells. This medicine is used to treat many types of cancer like lymphoma, myeloma, leukemia, breast cancer, and ovarian  cancer, to name a few. This medicine may be used for other purposes; ask your health care provider or pharmacist if you have questions. COMMON BRAND NAME(S): Cyclophosphamide, Cytoxan, Neosar What should I tell my care team before I take this medication? They need to know if you have any of these conditions: heart disease history of irregular heartbeat infection kidney disease liver disease low blood counts, like white cells, platelets, or red blood cells on hemodialysis recent or ongoing radiation therapy scarring or thickening of the lungs trouble passing urine an unusual or allergic reaction to cyclophosphamide, other medicines, foods, dyes, or preservatives pregnant or trying to get pregnant breast-feeding How should I use this medication? This drug is usually given as an injection into a vein or muscle or by infusion into a vein. It is administered in a hospital or clinic by a specially trained health care professional. Talk to your pediatrician regarding the use of this medicine in children. Special care may be needed. Overdosage: If you think you have taken too much of this medicine contact a poison control center or emergency room at once. NOTE: This medicine is only for you. Do not share this medicine with others. What if I miss a dose? It is important not to miss your dose. Call your doctor or health care professional if you are unable to keep an appointment. What may interact with this medication? amphotericin B azathioprine certain antivirals for HIV or hepatitis certain medicines for blood pressure, heart disease, irregular heart beat certain medicines that treat or prevent blood clots like warfarin certain other medicines for cancer cyclosporine etanercept indomethacin medicines that relax muscles for surgery medicines to increase blood counts metronidazole This list may not describe all possible interactions. Give your health care provider a list of all the  medicines, herbs, non-prescription drugs, or dietary supplements you use. Also tell them if you smoke, drink alcohol, or use illegal drugs. Some items may interact with your medicine. What should I watch for while using this medication? Your condition will be monitored carefully while you are receiving this medicine. You may need blood work done while you are taking this medicine. Drink water or other fluids as directed. Urinate often, even at night. Some products may contain alcohol. Ask your health care professional if this medicine contains alcohol. Be sure to tell all health care professionals you are taking this medicine. Certain medicines, like metronidazole and disulfiram, can cause an unpleasant reaction when taken with alcohol. The reaction includes flushing, headache, nausea, vomiting, sweating, and increased thirst. The reaction can last from 30 minutes to several hours. Do not become pregnant while taking this medicine or for 1 year after stopping it. Women should inform their health care professional if they wish to become pregnant or think they might be pregnant. Men  should not father a child while taking this medicine and for 4 months after stopping it. There is potential for serious side effects to an unborn child. Talk to your health care professional for more information. Do not breast-feed an infant while taking this medicine or for 1 week after stopping it. This medicine has caused ovarian failure in some women. This medicine may make it more difficult to get pregnant. Talk to your health care professional if you are concerned about your fertility. This medicine has caused decreased sperm counts in some men. This may make it more difficult to father a child. Talk to your health care professional if you are concerned about your fertility. Call your health care professional for advice if you get a fever, chills, or sore throat, or other symptoms of a cold or flu. Do not treat yourself.  This medicine decreases your body's ability to fight infections. Try to avoid being around people who are sick. Avoid taking medicines that contain aspirin, acetaminophen, ibuprofen, naproxen, or ketoprofen unless instructed by your health care professional. These medicines may hide a fever. Talk to your health care professional about your risk of cancer. You may be more at risk for certain types of cancer if you take this medicine. If you are going to need surgery or other procedure, tell your health care professional that you are using this medicine. Be careful brushing or flossing your teeth or using a toothpick because you may get an infection or bleed more easily. If you have any dental work done, tell your dentist you are receiving this medicine. What side effects may I notice from receiving this medication? Side effects that you should report to your doctor or health care professional as soon as possible: allergic reactions like skin rash, itching or hives, swelling of the face, lips, or tongue breathing problems nausea, vomiting signs and symptoms of bleeding such as bloody or black, tarry stools; red or dark brown urine; spitting up blood or brown material that looks like coffee grounds; red spots on the skin; unusual bruising or bleeding from the eyes, gums, or nose signs and symptoms of heart failure like fast, irregular heartbeat, sudden weight gain; swelling of the ankles, feet, hands signs and symptoms of infection like fever; chills; cough; sore throat; pain or trouble passing urine signs and symptoms of kidney injury like trouble passing urine or change in the amount of urine signs and symptoms of liver injury like dark yellow or brown urine; general ill feeling or flu-like symptoms; light-colored stools; loss of appetite; nausea; right upper belly pain; unusually weak or tired; yellowing of the eyes or skin Side effects that usually do not require medical attention (report to your  doctor or health care professional if they continue or are bothersome): confusion decreased hearing diarrhea facial flushing hair loss headache loss of appetite missed menstrual periods signs and symptoms of low red blood cells or anemia such as unusually weak or tired; feeling faint or lightheaded; falls skin discoloration This list may not describe all possible side effects. Call your doctor for medical advice about side effects. You may report side effects to FDA at 1-800-FDA-1088. Where should I keep my medication? This drug is given in a hospital or clinic and will not be stored at home. NOTE: This sheet is a summary. It may not cover all possible information. If you have questions about this medicine, talk to your doctor, pharmacist, or health care provider.  2023 Elsevier/Gold Standard (2021-06-27 00:00:00) Rituximab Injection What  is this medication? RITUXIMAB (ri TUX i mab) is a monoclonal antibody. It is used to treat certain types of cancer like non-Hodgkin lymphoma and chronic lymphocytic leukemia. It is also used to treat rheumatoid arthritis, granulomatosis with polyangiitis, microscopic polyangiitis, and pemphigus vulgaris. This medicine may be used for other purposes; ask your health care provider or pharmacist if you have questions. COMMON BRAND NAME(S): RIABNI, Rituxan, RUXIENCE, truxima What should I tell my care team before I take this medication? They need to know if you have any of these conditions: chest pain heart disease infection especially a viral infection such as chickenpox, cold sores, hepatitis B, or herpes immune system problems irregular heartbeat or rhythm kidney disease low blood counts (white cells, platelets, or red cells) lung disease recent or upcoming vaccine an unusual or allergic reaction to rituximab, other medicines, foods, dyes, or preservatives pregnant or trying to get pregnant breast-feeding How should I use this medication? This  medicine is injected into a vein. It is given by a health care provider in a hospital or clinic setting. A special MedGuide will be given to you before each treatment. Be sure to read this information carefully each time. Talk to your health care provider about the use of this medicine in children. While this drug may be prescribed for children as young as 6 months for selected conditions, precautions do apply. Overdosage: If you think you have taken too much of this medicine contact a poison control center or emergency room at once. NOTE: This medicine is only for you. Do not share this medicine with others. What if I miss a dose? Keep appointments for follow-up doses. It is important not to miss your dose. Call your health care provider if you are unable to keep an appointment. What may interact with this medication? Do not take this medicine with any of the following medicines: live vaccines This medicine may also interact with the following medicines: cisplatin This list may not describe all possible interactions. Give your health care provider a list of all the medicines, herbs, non-prescription drugs, or dietary supplements you use. Also tell them if you smoke, drink alcohol, or use illegal drugs. Some items may interact with your medicine. What should I watch for while using this medication? Your condition will be monitored carefully while you are receiving this medicine. You may need blood work done while you are taking this medicine. This medicine can cause serious infusion reactions. To reduce the risk your health care provider may give you other medicines to take before receiving this one. Be sure to follow the directions from your health care provider. This medicine may increase your risk of getting an infection. Call your health care provider for advice if you get a fever, chills, sore throat, or other symptoms of a cold or flu. Do not treat yourself. Try to avoid being around people who  are sick. Call your health care provider if you are around anyone with measles, chickenpox, or if you develop sores or blisters that do not heal properly. Avoid taking medicines that contain aspirin, acetaminophen, ibuprofen, naproxen, or ketoprofen unless instructed by your health care provider. These medicines may hide a fever. This medicine may cause serious skin reactions. They can happen weeks to months after starting the medicine. Contact your health care provider right away if you notice fevers or flu-like symptoms with a rash. The rash may be red or purple and then turn into blisters or peeling of the skin. Or, you might notice  a red rash with swelling of the face, lips or lymph nodes in your neck or under your arms. In some patients, this medicine may cause a serious brain infection that may cause death. If you have any problems seeing, thinking, speaking, walking, or standing, tell your healthcare professional right away. If you cannot reach your healthcare professional, urgently seek other source of medical care. Do not become pregnant while taking this medicine or for at least 12 months after stopping it. Women should inform their health care provider if they wish to become pregnant or think they might be pregnant. There is potential for serious harm to an unborn child. Talk to your health care provider for more information. Women should use a reliable form of birth control while taking this medicine and for 12 months after stopping it. Do not breast-feed while taking this medicine or for at least 6 months after stopping it. What side effects may I notice from receiving this medication? Side effects that you should report to your health care provider as soon as possible: allergic reactions (skin rash, itching or hives; swelling of the face, lips, or tongue) diarrhea edema (sudden weight gain; swelling of the ankles, feet, hands or other unusual swelling; trouble breathing) fast, irregular  heartbeat heart attack (trouble breathing; pain or tightness in the chest, neck, back or arms; unusually weak or tired) infection (fever, chills, cough, sore throat, pain or trouble passing urine) kidney injury (trouble passing urine or change in the amount of urine) liver injury (dark yellow or brown urine; general ill feeling or flu-like symptoms; loss of appetite, right upper belly pain; unusually weak or tired, yellowing of the eyes or skin) low blood pressure (dizziness; feeling faint or lightheaded, falls; unusually weak or tired) low red blood cell counts (trouble breathing; feeling faint; lightheaded, falls; unusually weak or tired) mouth sores redness, blistering, peeling, or loosening of the skin, including inside the mouth stomach pain unusual bruising or bleeding wheezing (trouble breathing with loud or whistling sounds) vomiting Side effects that usually do not require medical attention (report to your health care provider if they continue or are bothersome): headache joint pain muscle cramps, pain nausea This list may not describe all possible side effects. Call your doctor for medical advice about side effects. You may report side effects to FDA at 1-800-FDA-1088. Where should I keep my medication? This medicine is given in a hospital or clinic. It will not be stored at home. NOTE: This sheet is a summary. It may not cover all possible information. If you have questions about this medicine, talk to your doctor, pharmacist, or health care provider.  2023 Elsevier/Gold Standard (2020-07-29 00:00:00)       To help prevent nausea and vomiting after your treatment, we encourage you to take your nausea medication as directed.  BELOW ARE SYMPTOMS THAT SHOULD BE REPORTED IMMEDIATELY: *FEVER GREATER THAN 100.4 F (38 C) OR HIGHER *CHILLS OR SWEATING *NAUSEA AND VOMITING THAT IS NOT CONTROLLED WITH YOUR NAUSEA MEDICATION *UNUSUAL SHORTNESS OF BREATH *UNUSUAL BRUISING OR  BLEEDING *URINARY PROBLEMS (pain or burning when urinating, or frequent urination) *BOWEL PROBLEMS (unusual diarrhea, constipation, pain near the anus) TENDERNESS IN MOUTH AND THROAT WITH OR WITHOUT PRESENCE OF ULCERS (sore throat, sores in mouth, or a toothache) UNUSUAL RASH, SWELLING OR PAIN  UNUSUAL VAGINAL DISCHARGE OR ITCHING   Items with * indicate a potential emergency and should be followed up as soon as possible or go to the Emergency Department if any problems should occur.  Please show the CHEMOTHERAPY ALERT CARD or IMMUNOTHERAPY ALERT CARD at check-in to the Emergency Department and triage nurse.  Should you have questions after your visit or need to cancel or reschedule your appointment, please contact [facility] [phone]  and follow the prompts.  Office hours are 8:00 a.m. to 4:30 p.m. Monday - Friday. Please note that voicemails left after 4:00 p.m. may not be returned until the following business day.  We are closed weekends and major holidays. You have access to a nurse at all times for urgent questions. Please call the main number to the clinic [phone] and follow the prompts.  For any non-urgent questions, you may also contact your provider using MyChart. We now offer e-Visits for anyone 44 and older to request care online for non-urgent symptoms. For details visit [email or web address].   Also download the MyChart app! Go to the app store, search "MyChart", open the app, select [facility], and log in with your MyChart username and password.  Due to Covid, a mask is required upon entering the hospital/clinic. If you do not have a mask, one will be given to you upon arrival. For doctor visits, patients may have 1 support person aged 38 or older with them. For treatment visits, patients cannot have anyone with them due to current Covid guidelines and our immunocompromised population.

## 2022-01-06 NOTE — Patient Instructions (Signed)
[place] at [facility] Discharge Instructions   You were seen and examined today by Dr. Delton Coombes.  He reviewed your lab results which are normal/stable.  We will proceed with your treatment today.  Try to move around at home to help increase your energy levels.  Return as scheduled.    Thank you for choosing [place] at [facility] to provide your oncology and hematology care.  To afford each patient quality time with our provider, please arrive at least 15 minutes before your scheduled appointment time.   If you have a lab appointment with the [facility] please come in thru the Main Entrance and check in at the main information desk.  You need to re-schedule your appointment should you arrive 10 or more minutes late.  We strive to give you quality time with our providers, and arriving late affects you and other patients whose appointments are after yours.  Also, if you no show three or more times for appointments you may be dismissed from the clinic at the providers discretion.     Again, thank you for choosing [facility].  Our hope is that these requests will decrease the amount of time that you wait before being seen by our physicians.       _____________________________________________________________  Should you have questions after your visit to [facility], please contact our office at [phone] and follow the prompts.  Our office hours are 8:00 a.m. and 4:30 p.m. Monday - Friday.  Please note that voicemails left after 4:00 p.m. may not be returned until the following business day.  We are closed weekends and major holidays.  You do have access to a nurse 24-7, just call the main number to the clinic [phone] and do not press any options, hold on the line and a nurse will answer the phone.    For prescription refill requests, have your pharmacy contact our office and allow 72 hours.    Due  to Covid, you will need to wear a mask upon entering the hospital. If you do not have a mask, a mask will be given to you at the Main Entrance upon arrival. For doctor visits, patients may have 1 support person age 108 or older with them. For treatment visits, patients can not have anyone with them due to social distancing guidelines and our immunocompromised population.

## 2022-01-06 NOTE — Progress Notes (Signed)
Maintain chemo doses of cyclophosphamide at 750 mg/m2 and Etoposide at 50 mg/m2 with elevated creatinine.  Patient to receive fluids today.  T.O. Dr Rhys Martini, PharmD

## 2022-01-06 NOTE — Progress Notes (Signed)
[name] [name], [place] [address]   CLINIC:  Medical Oncology/Hematology  PCP:  Biagio Borg, MD [place] / [place] [address] [phone]   REASON FOR VISIT:  Follow-up for new start chemotherapy follow-up visit (high-grade B-cell lymphoma)  PRIOR THERAPY: None  CURRENT THERAPY: R-CEOP q21d x 3 Cycles  BRIEF ONCOLOGIC HISTORY:  Oncology History  High grade B-cell lymphoma ([facility])  12/03/2021 Initial Diagnosis   High grade B-cell lymphoma ([facility])    12/15/2021 -  Chemotherapy   Patient is on Treatment Plan : NON-HODGKIN'S LYMPHOMA R-CEOP q21d x 3 Cycles        CANCER STAGING:  Cancer Staging  High grade B-cell lymphoma ([facility]) Staging form: Hodgkin and Non-Hodgkin Lymphoma, AJCC 8th Edition - Clinical stage from 12/03/2021: Stage IV (Diffuse large B-cell lymphoma) - Unsigned   INTERVAL HISTORY:  [name] [name], a 76 y.o. male, returns for routine follow-up and consideration for next cycle of chemotherapy. [name] [name] was last seen on 12/22/2021.  Due for cycle #2 of R-CEOP today.   Overall, [name] [name] tells me [name] [name] has been feeling pretty well. [name] [name] denies n/v/d/c. [name] [name] reports his BM are "slow", but [name] [name] has not required stool softener at this time. [name] [name] reports fatigue, but [name] [name] is still able to do his typical daily activities. [name] [name] reports [name] [name] spends most of the day sitting down watching television. [name] [name] denies tingling/numbness. [name] [name] reports his appetite waxes and wanes; [name] [name] is not currently drinking Boot or Ensure, but [name] [name] has purchased some.   Overall, [name] [name] feels ready for next cycle of chemo today.   REVIEW OF SYSTEMS:  Review of Systems  Constitutional:  Positive for appetite change and fatigue.  Respiratory:  Positive for cough.   Cardiovascular:  Positive for chest pain (L breast).  Gastrointestinal:  Negative for constipation, diarrhea, nausea and vomiting.  Neurological:  Negative for numbness.  Psychiatric/Behavioral:  Positive for sleep  disturbance.   All other systems reviewed and are negative.  PAST MEDICAL/SURGICAL HISTORY:  Past Medical History:  Diagnosis Date   ALLERGIC RHINITIS 10/31/2007   Qualifier: Diagnosis of  By: Jenny Reichmann MD, [place]    Allergy    CHOLECYSTECTOMY, HX OF 06/04/2007   Qualifier: Diagnosis of  By: Danny Lawless [place], [place]     COLONIC POLYPS, HX OF 10/31/2007   Qualifier: Diagnosis of  By: Jenny Reichmann MD, [place] GRAFT, HX OF 06/04/2007   Qualifier: Diagnosis of  By: [place], [place]     CORONARY ARTERY DISEASE 06/04/2007   Qualifier: Diagnosis of  By: Danny Lawless CMA, [place]     Diverticulosis    Erectile dysfunction    Eye twitch    right eye since chilhood    HYPERLIPIDEMIA 06/04/2007   Qualifier: Diagnosis of  By: [place], [place]     Hypersomnolence    HYPERTENSION 06/04/2007   Qualifier: Diagnosis of  By: [place], [place]     Hypothyroidism    ISCHEMIC CARDIOMYOPATHY 06/04/2007   Qualifier: Diagnosis of  By: [place], [place]     Myocardial infarction ([facility])    per patient , his occurred in 1998    Osteoarthritis    PLMD (periodic limb movement disorder)    Sleep apnea    no cpap' per patient , "i was checked for it and they said i didnt have it "    Vertigo    Vitamin D deficiency    Past Surgical History:  Procedure Laterality Date   BIOPSY  11/25/2021   Procedure: BIOPSY;  Surgeon: Jackquline Denmark, MD;  Location: [facility];  Service: Gastroenterology;;   CHOLECYSTECTOMY     COLONOSCOPY     COLONOSCOPY WITH PROPOFOL N/A 11/25/2021   Procedure: COLONOSCOPY WITH PROPOFOL;  Surgeon: Jackquline Denmark, MD;  Location: [facility];  Service: Gastroenterology;  Laterality: N/A;   CORONARY ARTERY BYPASS GRAFT     ESOPHAGOGASTRODUODENOSCOPY (EGD) WITH PROPOFOL N/A 11/25/2021   Procedure: ESOPHAGOGASTRODUODENOSCOPY (EGD) WITH PROPOFOL;  Surgeon: Jackquline Denmark, MD;  Location: [facility];  Service: Gastroenterology;  Laterality: N/A;   ICD IMPLANT N/A 01/30/2020    Procedure: ICD IMPLANT;  Surgeon: Thompson Grayer, MD;  Location: [facility];  Service: Cardiovascular;  Laterality: N/A;   IR IMAGING GUIDED PORT INSERTION  12/04/2021   KNEE ARTHROPLASTY Left 08/05/2017   Procedure: LEFT TOTAL KNEE ARTHROPLASTY WITH COMPUTER NAVIGATION;  Surgeon: Rod Can, MD;  Location: [facility];  Service: Orthopedics;  Laterality: Left;  Needs RNFA   PENILE PROSTHESIS IMPLANT     POLYPECTOMY  11/25/2021   Procedure: POLYPECTOMY;  Surgeon: Jackquline Denmark, MD;  Location: [facility];  Service: Gastroenterology;;   REPLACEMENT TOTAL KNEE  06/12/2013   spinal cyst     THORACOTOMY     left anterior; wound exploration and debridement   TONSILLECTOMY AND ADENOIDECTOMY     age 62    SOCIAL HISTORY:  Social History   Socioeconomic History   Marital status: Married    Spouse name: [name]   Number of children: 3   Years of education: Not on file   Highest education level: Not on file  Occupational History   Occupation: truck Education administrator: [name]  Tobacco Use   Smoking status: Former    Packs/day: 4.00    Years: 17.00    Pack years: 68.00    Types: Cigarettes    Quit date: 05/07/1981    Years since quitting: 40.6   Smokeless tobacco: Never   Tobacco comments:    Pt states that [name] [name] would let most of them "burn" pt states that [name] [name] used anywhere between 4-5PPD  Vaping Use   Vaping Use: Never used  Substance and Sexual Activity   Alcohol use: Yes    Alcohol/week: 0.0 standard drinks    Comment: rare   Drug use: No   Sexual activity: Not on file  Other Topics Concern   Not on file  Social History Narrative   Lives in [place] with spouse   Retired Administrator   Social Determinants of Radio broadcast assistant Strain: Low Risk    Difficulty of Paying Living Expenses: Not hard at all  Food Insecurity: No Food Insecurity   Worried About Charity fundraiser in the Last Year: Never true   Arboriculturist in the Last Year: Never true   Transportation Needs: No Transportation Needs   Lack of Transportation (Medical): No   Lack of Transportation (Non-Medical): No  Physical Activity: Inactive   Days of Exercise per Week: 0 days   Minutes of Exercise per Session: 0 min  Stress: No Stress Concern Present   Feeling of Stress : Not at all  Social Connections: Moderately Isolated   Frequency of Communication with Friends and Family: More than three times a week   Frequency of Social Gatherings with Friends and Family: More than three times a week   Attends Religious Services: Never   Active Member of [employer]  or Organizations: No   Attends Archivist Meetings: Never   Marital Status: Married  Human resources officer Violence: Not At Risk   Fear of Current or Ex-Partner: No   Emotionally Abused: No   Physically Abused: No   Sexually Abused: No    FAMILY HISTORY:  Family History  Problem Relation Age of Onset   Stomach cancer Mother        smokes   Lung cancer Father        chewed tobacco   Heart disease Brother        first MI at 63yo, now 34 for transplant list/ ICM   Pancreatic cancer Brother    Colon cancer Maternal Grandfather    COPD Son        was a smoker   Pancreatic cancer Maternal Uncle    Pancreatic cancer Cousin        mat side x 4   Esophageal cancer Neg Hx    Prostate cancer Neg Hx    Rectal cancer Neg Hx     CURRENT MEDICATIONS:  Current Outpatient Medications  Medication Sig Dispense Refill   allopurinol (ZYLOPRIM) 300 MG tablet Take 1 tablet (300 mg total) by mouth daily. 30 tablet 3   ARTIFICIAL TEAR SOLUTION OP Place 1 drop into both eyes daily as needed (dry eyes).     Ascorbic Acid (VITA-C PO) Take 1,000 mg by mouth daily.     b complex vitamins capsule Take 1 capsule by mouth daily.     empagliflozin (JARDIANCE) 10 MG TABS tablet Take 1 tablet (10 mg total) by mouth daily before breakfast. 30 tablet 11   ezetimibe (ZETIA) 10 MG tablet TAKE 1 TABLET EVERY DAY (Patient taking  differently: Take by mouth every other day.) 90 tablet 3   furosemide (LASIX) 20 MG tablet Take 1 tablet (20 mg total) by mouth daily. 90 tablet 3   lidocaine-prilocaine (EMLA) cream Apply a small amount to port a cath site (do not rub in) and cover with plastic wrap 1 hour prior to infusion appointments 30 g 3   metoprolol succinate (TOPROL-XL) 50 MG 24 hr tablet TAKE 1 TABLET DAILY. TAKE WITH OR IMMEDIATELY FOLLOWING A MEAL. 90 tablet 1   Multiple Vitamins-Minerals (MULTIVITAMIN WITH MINERALS) tablet Take 1 tablet by mouth daily. Centrum silver     pantoprazole (PROTONIX) 40 MG tablet Take 1 tablet (40 mg total) by mouth daily. 90 tablet 3   predniSONE (DELTASONE) 20 MG tablet Take 5 tablets (100 mg) by mouth daily starting on the first day each treatment and the four days following. DO NOT take on weeks you are not receiving treatment 150 tablet 0   rosuvastatin (CRESTOR) 5 MG tablet TAKE 1 TABLET EVERY OTHER DAY 45 tablet 10   triamcinolone (NASACORT) 55 MCG/ACT AERO nasal inhaler Place 2 sprays into the nose daily. (Patient taking differently: Place 2 sprays into the nose daily as needed (allergies).) 3 Inhaler 3   valsartan (DIOVAN) 160 MG tablet Take 1 tablet (160 mg total) by mouth daily. 90 tablet 3   VITAMIN D PO Take 1,000 Units by mouth daily.     warfarin (COUMADIN) 5 MG tablet TAKE 1 TABLET EVERY DAY AT 4 PM (Patient taking differently: Take 2.5-5 mg by mouth See admin instructions. Taking 5 mg every day except on Friday taking 1/2 tablet = 2.5 mg) 90 tablet 4   prochlorperazine (COMPAZINE) 10 MG tablet Take 1 tablet (10 mg total) by mouth every 6 (six)  hours as needed for nausea or vomiting. (Patient not taking: Reported on 01/06/2022) 30 tablet 3   spironolactone (ALDACTONE) 25 MG tablet Take 0.5 tablets (12.5 mg total) by mouth daily. (Patient taking differently: Take 25 mg by mouth daily.) 45 tablet 3   No current facility-administered medications for this visit.    ALLERGIES:   Allergies  Allergen Reactions   Ace Inhibitors Cough   Statins Other (See Comments)    Muscle pains    PHYSICAL EXAM:  Performance status (ECOG): 0 - Asymptomatic  There were no vitals filed for this visit. Wt Readings from Last 3 Encounters:  01/06/22 195 lb 3.2 oz (88.5 kg)  12/24/21 201 lb (91.2 kg)  12/22/21 205 lb 14.4 oz (93.4 kg)   Physical Exam Vitals reviewed.  Constitutional:      Appearance: Normal appearance.  Cardiovascular:     Rate and Rhythm: Normal rate and regular rhythm.     Pulses: Normal pulses.     Heart sounds: Normal heart sounds.  Pulmonary:     Effort: Pulmonary effort is normal.     Breath sounds: Normal breath sounds.  Musculoskeletal:     Right lower leg: No edema.     Left lower leg: No edema.  Neurological:     General: No focal deficit present.     Mental Status: [name] [name] is alert and oriented to person, place, and time.  Psychiatric:        Mood and Affect: Mood normal.        Behavior: Behavior normal.    LABORATORY DATA:  I have reviewed the labs as listed.     Latest Ref Rng & Units 01/06/2022    7:59 AM 12/22/2021   10:08 AM 12/15/2021    8:33 AM  CBC  WBC 4.0 - 10.5 K/uL 9.5   7.5   7.0    Hemoglobin 13.0 - 17.0 g/dL 13.6   13.4   13.4    Hematocrit 39.0 - 52.0 % 41.0   40.1   41.6    Platelets 150 - 400 K/uL 166   82   144        Latest Ref Rng & Units 12/22/2021   10:08 AM 12/15/2021    8:33 AM 12/02/2021   12:01 PM  CMP  Glucose 70 - 99 mg/dL 182   121   120    BUN 8 - 23 mg/dL 38   29   26    Creatinine 0.61 - 1.24 mg/dL 1.13   1.37   1.60    Sodium 135 - 145 mmol/L 138   138   138    Potassium 3.5 - 5.1 mmol/L 4.0   4.5   4.7    Chloride 98 - 111 mmol/L 105   104   105    CO2 22 - 32 mmol/L _0 Calcium 8.9 - 10.3 mg/dL 8.7   9.1   9.2    Total Protein 6.5 - 8.1 g/dL 6.3   7.3     Total Bilirubin 0.3 - 1.2 mg/dL 1.0   0.9     Alkaline Phos 38 - 126 U/L 57   51     AST 15 - 41 U/L 25   25     ALT 0 - 44 U/L  46   17       DIAGNOSTIC IMAGING:  I have independently reviewed the scans and discussed with the patient.  NM PET Image Initial (PI) Skull Base To Thigh (F-18 FDG)  Result Date: 12/12/2021 CLINICAL DATA:  Initial treatment strategy for new diagnosis of lymphoma. Status post biopsy of right abdominal soft tissue mass. EXAM: NUCLEAR MEDICINE PET SKULL BASE TO THIGH TECHNIQUE: 10.8 mCi F-18 FDG was injected intravenously. Full-ring PET imaging was performed from the skull base to thigh after the radiotracer. CT data was obtained and used for attenuation correction and anatomic localization. Fasting blood glucose: 110 mg/dl COMPARISON:  10/31/2021 abdominopelvic CT. FINDINGS: Mediastinal blood pool activity: SUV max 2.6 Liver activity: SUV max 3.6 NECK: No areas of abnormal hypermetabolism. Incidental CT findings: No cervical adenopathy. Bilateral carotid atherosclerosis. Right ethmoid air cell mucosal thickening. CHEST: No thoracic nodal hypermetabolism. Extension of hypermetabolism from the gastric cardia into the distal esophagus including at a S.U.V. max of 8.0 on 135/3. Low-level hypermetabolism along a left subclavian venous branch without CT abnormality. Example at a S.U.V. max of 4.2 on 72/3. This just peripheral to the pacer insertion site, with gas along the pacer tract including on 68/3. Hypermetabolism along the anterior right hemidiaphragm may be supradiaphragmatic and measures a S.U.V. max of 4.4 on 138/3. Incidental CT findings: Right Port-A-Cath tip high right atrium. Aortic and coronary artery calcification. Mild centrilobular emphysema. ABDOMEN/PELVIS: Extensive multifocal gastric hypermetabolism. A lesser curvature component is progressive compared to the prior diagnostic CT within the gastric cardia and body. Example 3.7 cm and a S.U.V. max of 14.5 on 144/3. Hypermetabolism and soft tissue density within the pancreatic head measures 2.9 x 1.9 cm and a S.U.V. max of 9.7 on 162/3. Multifocal  small bowel hypermetabolism without well-defined CT correlate. Example within the jejunum at a S.U.V. max of 11.1 on 181/3. Bilateral, but right greater than left hypermetabolic anterior pararenal fascial thickening. Example on the right at 6.5 x 1.7 cm and a S.U.V. max of 16.0 on 204/3. Compare similar in size on the prior exam. Hypermetabolism corresponding to left peritoneal thickening anterior to the spleen measures a S.U.V. max of 7.2 on 154/3. Incidental CT findings: Cholecystectomy. Normal adrenal glands. No bowel obstruction or hydronephrosis. Abdominal aortic atherosclerosis. Scattered colonic diverticula. Penile prosthesis with deflated suprapubic reservoir. Bilateral fat containing inguinal hernias. SKELETON: No abnormal marrow activity. Incidental CT findings: No acute osseous abnormality. IMPRESSION: 1. Widespread, multifocal lymphoma within the abdomen and less so low chest, as detailed above. Some sites are felt to be progressive compared to the CT of 10/31/2021. ([facility]) 5 2. Left subclavian venous hypermetabolism just peripheral to the pacer insertion site. Gas along the tract insertion site. If the pacer has been recently placed or manipulated, this venous hypermetabolism could be physiologic. If not, recommend correlation for possible venous thrombosis and consideration of ultrasound. 3. Incidental findings, including: Coronary artery atherosclerosis. Aortic Atherosclerosis (ICD10-I70.0). Emphysema (ICD10-J43.9). Sinus disease. Electronically Signed   By: Abigail Miyamoto M.D.   On: 12/12/2021 11:16   DG Finger Little Left  Result Date: 12/16/2021 CLINICAL DATA:  Dog bite EXAM: LEFT LITTLE FINGER 2+V COMPARISON:  None Available. FINDINGS: No fracture or malalignment. Mild degenerative change at the D IP joint. No radiopaque foreign body IMPRESSION: No acute osseous abnormality Electronically Signed   By: Donavan Foil M.D.   On: 12/16/2021 21:22   CUP PACEART INCLINIC DEVICE CHECK  Result  Date: 12/16/2021 ICD check in clinic. Normal device function. Thresholds and sensing consistent with previous device measurements. Impedance trends stable over time. No mode switches. No ventricular arrhythmias. Histogram distribution appropriate for patient and level of  activity. No changes made this session. Device programmed at appropriate safety margins. Optivol trending up, thoracic impedence down. Estimated longevity 8 yr, 8 mo. Pt enrolled in remote follow-up.    ASSESSMENT:  Stage IVb high-grade B-cell lymphoma: - [name] [name] had unintentional weight loss of 20 pounds since September 2022.  Weight has been stable for the last 2 months.  [name] [name] had multiple family members with pancreatic cancer. - CT AP with contrast (10/31/2021): Abnormal nodular thickening of the right anterior pararenal fascia, suspicious for atypical malignancy.  No evidence of pancreatic malignancy.  Irregular wall thickening of the proximal stomach.  No ascites or peritoneal nodularity. - Biopsy of nodular thickening (11/17/2021): High-grade B-cell lymphoma with Burkitt-like features.  Ki-67 100%. 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  Neoplastic lymphocytes positive for CD20, CD10, Bcl-2, BCL6 and mum 1.  Ki-67 more than 95%.  H pylori IHC negative. - High-grade lymphoma panel (11/17/2021): Negative for BCL6, MYC, BCL2 rearrangements, negative for MYC amplification, t(8:14) not detected. - High risk features for CNS disease: Age more than 3, stage III/IV, extranodal involvement more than 1 site.  Based on 3 points, intermediate risk.  No clinical signs of CNS involvement. - IPI: High intermediate with 3 points (age more than 72, stage III/IV, extranodal involvement more than 1 site) - R-CEOP cycle 1 started on 12/15/2021    Social/family history: - [name] [name] has AICD placed in June 2021 secondary to CHF. 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  [name] [name] worked at [employer] in [place].  [name] [name] had exposure to some chemicals.  [name] [name] quit smoking more than 40 years ago.  [name] [name] drinks mostly beer and occasionally whiskey at bedtime.   Ischemic cardiomyopathy/CHF/apical thrombus: - [name] [name] is on Coumadin.  Will check PT/INR today. - Last seen by Dr. [name] on 11/06/2021.   -Last echo on 12/27/2020 with EF 30-35%.  Limited visualization of endocardium.  LV has moderately decreased function.   PLAN:  Stage IVb high-grade B-cell lymphoma: - [name] [name] has tolerated first cycle of chemotherapy reasonably well. - [name] [name] had slight constipation and slight fatigue.  Denies any tingling or numbness in extremities. - Reviewed labs today which showed normal CBC and LDH.  Uric acid was 4.4. - Creatinine is elevated at 1.64. - Proceed with cycle 2 without any dose modifications.  RTC 3 weeks for follow-up.  Plan to repeat scans after cycle 3. - [name] [name] was encouraged to be active and mobile for improvement in fatigue.  2.  TLS prophylaxis: - Uric acid is 4.4.  Potassium, magnesium and phosphate were normal. - Continue allopurinol 300 mg daily.  3.  Acute kidney injury on CKD: - His creatinine is elevated at 1.64 today.  We will give him 1 L of fluid over 2 hours.   Orders placed this encounter:  Orders Placed This Encounter  Procedures   Uric acid   Lactate dehydrogenase   Phosphorus     Derek Jack, MD [place] [phone]   I, Thana Ates, am acting as a scribe for Dr. Derek Jack.  I, Derek Jack MD, have reviewed the above documentation for accuracy and completeness, and I agree with the above.

## 2022-01-06 NOTE — Progress Notes (Signed)
Chaplain engaged in an initial visit with Rush Landmark and his wife [name].  Chaplain learned about Bill's healthcare journey, his life with [name], and about his faith.  Chaplain was able to spend a significant amount of time getting to know Rush Landmark and [name] and checking in with him about his treatments.  Rush Landmark and [name] are originally from [place] and met while working together on third shift.  That have been married over 44 years and have a blended family of four children.  Bill voiced that he knew [name] would be his wife.    Throughout their years together they have been able to retire and build a home in [place].  They have been in [place] over 20 years.    Rush Landmark was also able to share the story of finding out he had cancer after his brother passed in January.  It was then that he was able to unearth a long history of stomach or pancreatic cancer in his family.  Through his brother's passing, he was able to get a scan done to see if he had cancer.  His diagnosis has also paved the way for him to let the men in his family know what to look out for and to receive early detection.  Bill and [name] have utilized their faith and experiences to keep going through this time.  Bill noted that he hasn't had any aches or pains.  They shared about the ways they have seen God evident in their lives.   Chaplain offered reflective listening, a compassionate presence, and spiritual care support.     01/06/22 1100  Clinical Encounter Type  Visited With Patient and family together  Visit Type Initial;Spiritual support

## 2022-01-07 ENCOUNTER — Inpatient Hospital Stay (HOSPITAL_COMMUNITY): Payer: No Typology Code available for payment source

## 2022-01-07 ENCOUNTER — Encounter (HOSPITAL_COMMUNITY): Payer: Self-pay | Admitting: Hematology

## 2022-01-07 VITALS — BP 134/75 | HR 72 | Temp 97.1°F | Resp 18 | Wt 204.4 lb

## 2022-01-07 DIAGNOSIS — Z5111 Encounter for antineoplastic chemotherapy: Secondary | ICD-10-CM | POA: Diagnosis not present

## 2022-01-07 DIAGNOSIS — C8510 Unspecified B-cell lymphoma, unspecified site: Secondary | ICD-10-CM

## 2022-01-07 MED ORDER — HEPARIN SOD (PORK) LOCK FLUSH 100 UNIT/ML IV SOLN
500.0000 [IU] | Freq: Once | INTRAVENOUS | Status: AC | PRN
  Administered 2022-01-07: 500 [IU]

## 2022-01-07 MED ORDER — PROCHLORPERAZINE MALEATE 10 MG PO TABS
10.0000 mg | ORAL_TABLET | Freq: Once | ORAL | Status: AC
  Administered 2022-01-07: 10 mg via ORAL
  Filled 2022-01-07: qty 1

## 2022-01-07 MED ORDER — SODIUM CHLORIDE 0.9 % IV SOLN: Freq: Once | INTRAVENOUS | Status: AC

## 2022-01-07 MED ORDER — SODIUM CHLORIDE 0.9% FLUSH
10.0000 mL | INTRAVENOUS | Status: DC | PRN
  Administered 2022-01-07: 10 mL

## 2022-01-07 MED ORDER — SODIUM CHLORIDE 0.9 % IV SOLN
50.0000 mg/m2 | Freq: Once | INTRAVENOUS | Status: AC
  Administered 2022-01-07: 110 mg via INTRAVENOUS
  Filled 2022-01-07: qty 5.5

## 2022-01-07 NOTE — Patient Instructions (Signed)
[facility]  Discharge Instructions: Thank you for choosing [facility] to provide your oncology and hematology care.  If you have a lab appointment with the [facility], please come in thru the Main Entrance and check in at the main information desk.  Wear comfortable clothing and clothing appropriate for easy access to any Portacath or PICC line.   We strive to give you quality time with your provider. You may need to reschedule your appointment if you arrive late (15 or more minutes).  Arriving late affects you and other patients whose appointments are after yours.  Also, if you miss three or more appointments without notifying the office, you may be dismissed from the clinic at the provider's discretion.      For prescription refill requests, have your pharmacy contact our office and allow 72 hours for refills to be completed.    Today you received the following chemotherapy and/or immunotherapy agents D2 Etoposide   To help prevent nausea and vomiting after your treatment, we encourage you to take your nausea medication as directed.  BELOW ARE SYMPTOMS THAT SHOULD BE REPORTED IMMEDIATELY: *FEVER GREATER THAN 100.4 F (38 C) OR HIGHER *CHILLS OR SWEATING *NAUSEA AND VOMITING THAT IS NOT CONTROLLED WITH YOUR NAUSEA MEDICATION *UNUSUAL SHORTNESS OF BREATH *UNUSUAL BRUISING OR BLEEDING *URINARY PROBLEMS (pain or burning when urinating, or frequent urination) *BOWEL PROBLEMS (unusual diarrhea, constipation, pain near the anus) TENDERNESS IN MOUTH AND THROAT WITH OR WITHOUT PRESENCE OF ULCERS (sore throat, sores in mouth, or a toothache) UNUSUAL RASH, SWELLING OR PAIN  UNUSUAL VAGINAL DISCHARGE OR ITCHING   Items with * indicate a potential emergency and should be followed up as soon as possible or go to the Emergency Department if any problems should occur.  Please show the CHEMOTHERAPY ALERT CARD or IMMUNOTHERAPY ALERT CARD at check-in to the Emergency  Department and triage nurse.  Should you have questions after your visit or need to cancel or reschedule your appointment, please contact [facility] [phone]  and follow the prompts.  Office hours are 8:00 a.m. to 4:30 p.m. Monday - Friday. Please note that voicemails left after 4:00 p.m. may not be returned until the following business day.  We are closed weekends and major holidays. You have access to a nurse at all times for urgent questions. Please call the main number to the clinic [phone] and follow the prompts.  For any non-urgent questions, you may also contact your provider using MyChart. We now offer e-Visits for anyone 55 and older to request care online for non-urgent symptoms. For details visit [email or web address].   Also download the MyChart app! Go to the app store, search "MyChart", open the app, select [facility], and log in with your MyChart username and password.  Due to Covid, a mask is required upon entering the hospital/clinic. If you do not have a mask, one will be given to you upon arrival. For doctor visits, patients may have 1 support person aged 65 or older with them. For treatment visits, patients cannot have anyone with them due to current Covid guidelines and our immunocompromised population.

## 2022-01-07 NOTE — Progress Notes (Signed)
Etoposide given today per MD orders. Tolerated infusion without adverse affects. Vital signs stable. No complaints at this time. Discharged from clinic ambulatory in stable condition. Alert and oriented x 3. F/U with [facility] as scheduled.   

## 2022-01-07 NOTE — Progress Notes (Signed)
Patient presents today for chemotherapy infusion.  Patient is in satisfactory condition with no complaints voiced.  Vital signs are stable.  We will proceed with treatment per MD orders.  

## 2022-01-08 ENCOUNTER — Inpatient Hospital Stay (HOSPITAL_COMMUNITY): Payer: No Typology Code available for payment source | Attending: Hematology

## 2022-01-08 ENCOUNTER — Encounter (HOSPITAL_BASED_OUTPATIENT_CLINIC_OR_DEPARTMENT_OTHER): Payer: Self-pay

## 2022-01-08 ENCOUNTER — Inpatient Hospital Stay (HOSPITAL_COMMUNITY): Payer: No Typology Code available for payment source

## 2022-01-08 ENCOUNTER — Ambulatory Visit (INDEPENDENT_AMBULATORY_CARE_PROVIDER_SITE_OTHER): Payer: Medicare HMO | Admitting: *Deleted

## 2022-01-08 VITALS — BP 128/74 | HR 80 | Temp 96.3°F | Resp 18 | Ht 69.0 in | Wt 203.0 lb

## 2022-01-08 DIAGNOSIS — C8513 Unspecified B-cell lymphoma, intra-abdominal lymph nodes: Secondary | ICD-10-CM | POA: Insufficient documentation

## 2022-01-08 DIAGNOSIS — Z5111 Encounter for antineoplastic chemotherapy: Secondary | ICD-10-CM | POA: Insufficient documentation

## 2022-01-08 DIAGNOSIS — Z5181 Encounter for therapeutic drug level monitoring: Secondary | ICD-10-CM

## 2022-01-08 DIAGNOSIS — Z5189 Encounter for other specified aftercare: Secondary | ICD-10-CM | POA: Insufficient documentation

## 2022-01-08 DIAGNOSIS — C8510 Unspecified B-cell lymphoma, unspecified site: Secondary | ICD-10-CM

## 2022-01-08 DIAGNOSIS — Z5112 Encounter for antineoplastic immunotherapy: Secondary | ICD-10-CM | POA: Insufficient documentation

## 2022-01-08 DIAGNOSIS — I240 Acute coronary thrombosis not resulting in myocardial infarction: Secondary | ICD-10-CM | POA: Diagnosis not present

## 2022-01-08 LAB — POCT INR: INR: 3.2 — AB (ref 2.0–3.0)

## 2022-01-08 MED ORDER — SODIUM CHLORIDE 0.9 % IV SOLN: Freq: Once | INTRAVENOUS | Status: AC

## 2022-01-08 MED ORDER — SODIUM CHLORIDE 0.9 % IV SOLN
50.0000 mg/m2 | Freq: Once | INTRAVENOUS | Status: AC
  Administered 2022-01-08: 110 mg via INTRAVENOUS
  Filled 2022-01-08: qty 5.5

## 2022-01-08 MED ORDER — PALONOSETRON HCL INJECTION 0.25 MG/5ML
0.2500 mg | Freq: Once | INTRAVENOUS | Status: AC
  Administered 2022-01-08: 0.25 mg via INTRAVENOUS
  Filled 2022-01-08: qty 5

## 2022-01-08 MED ORDER — HEPARIN SOD (PORK) LOCK FLUSH 100 UNIT/ML IV SOLN
500.0000 [IU] | Freq: Once | INTRAVENOUS | Status: AC | PRN
  Administered 2022-01-08: 500 [IU]

## 2022-01-08 MED ORDER — SODIUM CHLORIDE 0.9% FLUSH
10.0000 mL | INTRAVENOUS | Status: DC | PRN
  Administered 2022-01-08: 10 mL

## 2022-01-08 NOTE — Patient Instructions (Signed)
[facility]  Discharge Instructions: Thank you for choosing [facility] to provide your oncology and hematology care.  If you have a lab appointment with the [facility], please come in thru the Main Entrance and check in at the main information desk.  Wear comfortable clothing and clothing appropriate for easy access to any Portacath or PICC line.   We strive to give you quality time with your provider. You may need to reschedule your appointment if you arrive late (15 or more minutes).  Arriving late affects you and other patients whose appointments are after yours.  Also, if you miss three or more appointments without notifying the office, you may be dismissed from the clinic at the provider's discretion.      For prescription refill requests, have your pharmacy contact our office and allow 72 hours for refills to be completed.    Today you received the following chemotherapy and/or immunotherapy agents D2 Etoposide   To help prevent nausea and vomiting after your treatment, we encourage you to take your nausea medication as directed.  BELOW ARE SYMPTOMS THAT SHOULD BE REPORTED IMMEDIATELY: *FEVER GREATER THAN 100.4 F (38 C) OR HIGHER *CHILLS OR SWEATING *NAUSEA AND VOMITING THAT IS NOT CONTROLLED WITH YOUR NAUSEA MEDICATION *UNUSUAL SHORTNESS OF BREATH *UNUSUAL BRUISING OR BLEEDING *URINARY PROBLEMS (pain or burning when urinating, or frequent urination) *BOWEL PROBLEMS (unusual diarrhea, constipation, pain near the anus) TENDERNESS IN MOUTH AND THROAT WITH OR WITHOUT PRESENCE OF ULCERS (sore throat, sores in mouth, or a toothache) UNUSUAL RASH, SWELLING OR PAIN  UNUSUAL VAGINAL DISCHARGE OR ITCHING   Items with * indicate a potential emergency and should be followed up as soon as possible or go to the Emergency Department if any problems should occur.  Please show the CHEMOTHERAPY ALERT CARD or IMMUNOTHERAPY ALERT CARD at check-in to the Emergency  Department and triage nurse.  Should you have questions after your visit or need to cancel or reschedule your appointment, please contact [facility] [phone]  and follow the prompts.  Office hours are 8:00 a.m. to 4:30 p.m. Monday - Friday. Please note that voicemails left after 4:00 p.m. may not be returned until the following business day.  We are closed weekends and major holidays. You have access to a nurse at all times for urgent questions. Please call the main number to the clinic [phone] and follow the prompts.  For any non-urgent questions, you may also contact your provider using MyChart. We now offer e-Visits for anyone 79 and older to request care online for non-urgent symptoms. For details visit [email or web address].   Also download the MyChart app! Go to the app store, search "MyChart", open the app, select [facility], and log in with your MyChart username and password.  Due to Covid, a mask is required upon entering the hospital/clinic. If you do not have a mask, one will be given to you upon arrival. For doctor visits, patients may have 1 support person aged 89 or older with them. For treatment visits, patients cannot have anyone with them due to current Covid guidelines and our immunocompromised population.

## 2022-01-08 NOTE — Patient Instructions (Signed)
Pt takes Prednisone 100 mg on the first 5 days of chemo treatment.  -Take 1/2 tablet of warfarin today -Then continue to take warfarin 1 tablet daily except 1/2 tablet on Mondays, Wednesdays and Fridays Eat extra greens days you take prednisone.

## 2022-01-08 NOTE — Progress Notes (Signed)
Pt presents today for D3 Etoposide per provider's order. Vital signs stable and pt voiced no new complaints at this time.  D3 Etoposide given today per MD orders. Tolerated infusion without adverse affects. Vital signs stable. No complaints at this time. Discharged from clinic ambulatory in stable condition. Alert and oriented x 3. F/U with [facility] as scheduled.   

## 2022-01-09 ENCOUNTER — Encounter (HOSPITAL_COMMUNITY): Payer: Self-pay

## 2022-01-09 ENCOUNTER — Inpatient Hospital Stay (HOSPITAL_COMMUNITY): Payer: No Typology Code available for payment source

## 2022-01-09 ENCOUNTER — Ambulatory Visit (HOSPITAL_COMMUNITY): Payer: No Typology Code available for payment source

## 2022-01-09 VITALS — BP 127/75 | HR 55 | Temp 98.7°F | Resp 18

## 2022-01-09 DIAGNOSIS — Z5111 Encounter for antineoplastic chemotherapy: Secondary | ICD-10-CM | POA: Diagnosis not present

## 2022-01-09 DIAGNOSIS — C8510 Unspecified B-cell lymphoma, unspecified site: Secondary | ICD-10-CM

## 2022-01-09 MED ORDER — PEGFILGRASTIM-CBQV 6 MG/0.6ML ~~LOC~~ SOSY
6.0000 mg | PREFILLED_SYRINGE | Freq: Once | SUBCUTANEOUS | Status: AC
  Administered 2022-01-09: 6 mg via SUBCUTANEOUS
  Filled 2022-01-09: qty 0.6

## 2022-01-09 NOTE — Patient Instructions (Signed)
[facility]  Discharge Instructions: Thank you for choosing [facility] to provide your oncology and hematology care.  If you have a lab appointment with the Cancer Center, please come in thru the Main Entrance and check in at the main information desk.  Wear comfortable clothing and clothing appropriate for easy access to any Portacath or PICC line.   We strive to give you quality time with your provider. You may need to reschedule your appointment if you arrive late (15 or more minutes).  Arriving late affects you and other patients whose appointments are after yours.  Also, if you miss three or more appointments without notifying the office, you may be dismissed from the clinic at the provider's discretion.      For prescription refill requests, have your pharmacy contact our office and allow 72 hours for refills to be completed.    Today you received the following chemotherapy and/or immunotherapy agents udenyca.       To help prevent nausea and vomiting after your treatment, we encourage you to take your nausea medication as directed.  BELOW ARE SYMPTOMS THAT SHOULD BE REPORTED IMMEDIATELY: *FEVER GREATER THAN 100.4 F (38 C) OR HIGHER *CHILLS OR SWEATING *NAUSEA AND VOMITING THAT IS NOT CONTROLLED WITH YOUR NAUSEA MEDICATION *UNUSUAL SHORTNESS OF BREATH *UNUSUAL BRUISING OR BLEEDING *URINARY PROBLEMS (pain or burning when urinating, or frequent urination) *BOWEL PROBLEMS (unusual diarrhea, constipation, pain near the anus) TENDERNESS IN MOUTH AND THROAT WITH OR WITHOUT PRESENCE OF ULCERS (sore throat, sores in mouth, or a toothache) UNUSUAL RASH, SWELLING OR PAIN  UNUSUAL VAGINAL DISCHARGE OR ITCHING   Items with * indicate a potential emergency and should be followed up as soon as possible or go to the Emergency Department if any problems should occur.  Please show the CHEMOTHERAPY ALERT CARD or IMMUNOTHERAPY ALERT CARD at check-in to the Emergency  Department and triage nurse.  Should you have questions after your visit or need to cancel or reschedule your appointment, please contact [facility] [phone]  and follow the prompts.  Office hours are 8:00 a.m. to 4:30 p.m. Monday - Friday. Please note that voicemails left after 4:00 p.m. may not be returned until the following business day.  We are closed weekends and major holidays. You have access to a nurse at all times for urgent questions. Please call the main number to the clinic [phone] and follow the prompts.  For any non-urgent questions, you may also contact your provider using MyChart. We now offer e-Visits for anyone 18 and older to request care online for non-urgent symptoms. For details visit [email or web address].   Also download the MyChart app! Go to the app store, search "MyChart", open the app, select [facility], and log in with your MyChart username and password.  Due to Covid, a mask is required upon entering the hospital/clinic. If you do not have a mask, one will be given to you upon arrival. For doctor visits, patients may have 1 support person aged 18 or older with them. For treatment visits, patients cannot have anyone with them due to current Covid guidelines and our immunocompromised population.  

## 2022-01-12 ENCOUNTER — Encounter (HOSPITAL_BASED_OUTPATIENT_CLINIC_OR_DEPARTMENT_OTHER): Payer: Self-pay

## 2022-01-12 ENCOUNTER — Ambulatory Visit (INDEPENDENT_AMBULATORY_CARE_PROVIDER_SITE_OTHER): Payer: Medicare HMO

## 2022-01-12 DIAGNOSIS — I5042 Chronic combined systolic (congestive) and diastolic (congestive) heart failure: Secondary | ICD-10-CM

## 2022-01-12 DIAGNOSIS — I10 Essential (primary) hypertension: Secondary | ICD-10-CM

## 2022-01-12 DIAGNOSIS — Z5111 Encounter for antineoplastic chemotherapy: Secondary | ICD-10-CM | POA: Diagnosis not present

## 2022-01-12 DIAGNOSIS — I2510 Atherosclerotic heart disease of native coronary artery without angina pectoris: Secondary | ICD-10-CM

## 2022-01-12 DIAGNOSIS — I739 Peripheral vascular disease, unspecified: Secondary | ICD-10-CM | POA: Diagnosis not present

## 2022-01-12 LAB — ECHOCARDIOGRAM COMPLETE
AR max vel: 1.88 cm2
AV Area VTI: 1.53 cm2
AV Area mean vel: 1.59 cm2
AV Mean grad: 7.5 mmHg
AV Peak grad: 13.8 mmHg
AV Vena cont: 0.29 cm
Ao pk vel: 1.86 m/s
Area-P 1/2: 2.91 cm2
P 1/2 time: 535 msec
S' Lateral: 3.85 cm

## 2022-01-12 MED ORDER — PERFLUTREN LIPID MICROSPHERE
1.0000 mL | INTRAVENOUS | Status: AC | PRN
  Administered 2022-01-12: 2 mL via INTRAVENOUS

## 2022-01-12 NOTE — Progress Notes (Unsigned)
Bilateral leg arterial duplex ultrasound performed and no evidence of stenosis seen.  Incidental finding of large right distal popiteal arterial aneurysm seen with thrombus within.   Report can be found under CV Imaging.  Donalee Citrin through [employer] and Dr. Oval Linsey (DOD) at [facility] was notified of results.    Patient was sent home.  Salvadore Dom, RDCS(AE), RVT(VT), RDMS [facility]

## 2022-01-14 ENCOUNTER — Other Ambulatory Visit: Payer: Self-pay

## 2022-01-14 DIAGNOSIS — I724 Aneurysm of artery of lower extremity: Secondary | ICD-10-CM

## 2022-01-19 ENCOUNTER — Emergency Department (HOSPITAL_COMMUNITY): Payer: No Typology Code available for payment source

## 2022-01-19 ENCOUNTER — Other Ambulatory Visit: Payer: Self-pay

## 2022-01-19 ENCOUNTER — Encounter (HOSPITAL_COMMUNITY): Payer: Self-pay | Admitting: Emergency Medicine

## 2022-01-19 ENCOUNTER — Other Ambulatory Visit: Payer: Self-pay | Admitting: Cardiology

## 2022-01-19 ENCOUNTER — Inpatient Hospital Stay (HOSPITAL_COMMUNITY)
Admission: EM | Admit: 2022-01-19 | Discharge: 2022-01-21 | DRG: 193 | Disposition: A | Discharge status: Home / Self Care | Payer: No Typology Code available for payment source | Attending: Internal Medicine | Admitting: Internal Medicine

## 2022-01-19 ENCOUNTER — Encounter (HOSPITAL_COMMUNITY): Payer: Self-pay | Admitting: Hematology

## 2022-01-19 ENCOUNTER — Other Ambulatory Visit (HOSPITAL_COMMUNITY): Payer: Self-pay | Admitting: *Deleted

## 2022-01-19 ENCOUNTER — Ambulatory Visit (INDEPENDENT_AMBULATORY_CARE_PROVIDER_SITE_OTHER): Payer: No Typology Code available for payment source

## 2022-01-19 ENCOUNTER — Telehealth (HOSPITAL_COMMUNITY): Payer: Self-pay | Admitting: *Deleted

## 2022-01-19 DIAGNOSIS — E785 Hyperlipidemia, unspecified: Secondary | ICD-10-CM | POA: Diagnosis present

## 2022-01-19 DIAGNOSIS — Z951 Presence of aortocoronary bypass graft: Secondary | ICD-10-CM

## 2022-01-19 DIAGNOSIS — I5042 Chronic combined systolic (congestive) and diastolic (congestive) heart failure: Secondary | ICD-10-CM

## 2022-01-19 DIAGNOSIS — I10 Essential (primary) hypertension: Secondary | ICD-10-CM | POA: Diagnosis present

## 2022-01-19 DIAGNOSIS — J189 Pneumonia, unspecified organism: Principal | ICD-10-CM | POA: Diagnosis present

## 2022-01-19 DIAGNOSIS — Z888 Allergy status to other drugs, medicaments and biological substances status: Secondary | ICD-10-CM | POA: Diagnosis not present

## 2022-01-19 DIAGNOSIS — Z7901 Long term (current) use of anticoagulants: Secondary | ICD-10-CM | POA: Diagnosis not present

## 2022-01-19 DIAGNOSIS — I255 Ischemic cardiomyopathy: Secondary | ICD-10-CM | POA: Diagnosis present

## 2022-01-19 DIAGNOSIS — E039 Hypothyroidism, unspecified: Secondary | ICD-10-CM | POA: Diagnosis present

## 2022-01-19 DIAGNOSIS — J9601 Acute respiratory failure with hypoxia: Secondary | ICD-10-CM | POA: Diagnosis not present

## 2022-01-19 DIAGNOSIS — Z95810 Presence of automatic (implantable) cardiac defibrillator: Secondary | ICD-10-CM | POA: Diagnosis not present

## 2022-01-19 DIAGNOSIS — Z800 Family history of malignant neoplasm of digestive organs: Secondary | ICD-10-CM

## 2022-01-19 DIAGNOSIS — C8510 Unspecified B-cell lymphoma, unspecified site: Secondary | ICD-10-CM | POA: Diagnosis not present

## 2022-01-19 DIAGNOSIS — Z801 Family history of malignant neoplasm of trachea, bronchus and lung: Secondary | ICD-10-CM | POA: Diagnosis not present

## 2022-01-19 DIAGNOSIS — I2510 Atherosclerotic heart disease of native coronary artery without angina pectoris: Secondary | ICD-10-CM | POA: Diagnosis present

## 2022-01-19 DIAGNOSIS — Z96652 Presence of left artificial knee joint: Secondary | ICD-10-CM | POA: Diagnosis present

## 2022-01-19 DIAGNOSIS — I513 Intracardiac thrombosis, not elsewhere classified: Secondary | ICD-10-CM | POA: Diagnosis present

## 2022-01-19 DIAGNOSIS — Z8249 Family history of ischemic heart disease and other diseases of the circulatory system: Secondary | ICD-10-CM | POA: Diagnosis not present

## 2022-01-19 DIAGNOSIS — R0602 Shortness of breath: Secondary | ICD-10-CM | POA: Diagnosis present

## 2022-01-19 DIAGNOSIS — Z79899 Other long term (current) drug therapy: Secondary | ICD-10-CM | POA: Diagnosis not present

## 2022-01-19 DIAGNOSIS — Z825 Family history of asthma and other chronic lower respiratory diseases: Secondary | ICD-10-CM

## 2022-01-19 DIAGNOSIS — Z20822 Contact with and (suspected) exposure to covid-19: Secondary | ICD-10-CM | POA: Diagnosis present

## 2022-01-19 DIAGNOSIS — M1712 Unilateral primary osteoarthritis, left knee: Secondary | ICD-10-CM

## 2022-01-19 DIAGNOSIS — Z7951 Long term (current) use of inhaled steroids: Secondary | ICD-10-CM

## 2022-01-19 DIAGNOSIS — Z87891 Personal history of nicotine dependence: Secondary | ICD-10-CM | POA: Diagnosis not present

## 2022-01-19 DIAGNOSIS — J849 Interstitial pulmonary disease, unspecified: Secondary | ICD-10-CM | POA: Diagnosis not present

## 2022-01-19 LAB — COMPREHENSIVE METABOLIC PANEL
ALT: 67 U/L — ABNORMAL HIGH (ref 0–44)
AST: 44 U/L — ABNORMAL HIGH (ref 15–41)
Albumin: 3.1 g/dL — ABNORMAL LOW (ref 3.5–5.0)
Alkaline Phosphatase: 123 U/L (ref 38–126)
Anion gap: 7 (ref 5–15)
BUN: 26 mg/dL — ABNORMAL HIGH (ref 8–23)
CO2: 28 mmol/L (ref 22–32)
Calcium: 8.8 mg/dL — ABNORMAL LOW (ref 8.9–10.3)
Chloride: 101 mmol/L (ref 98–111)
Creatinine, Ser: 1.35 mg/dL — ABNORMAL HIGH (ref 0.61–1.24)
GFR, Estimated: 54 mL/min — ABNORMAL LOW (ref >60)
Glucose, Bld: 150 mg/dL — ABNORMAL HIGH (ref 70–99)
Potassium: 4.8 mmol/L (ref 3.5–5.1)
Sodium: 136 mmol/L (ref 135–145)
Total Bilirubin: 1.1 mg/dL (ref 0.3–1.2)
Total Protein: 6.9 g/dL (ref 6.5–8.1)

## 2022-01-19 LAB — URINALYSIS, ROUTINE W REFLEX MICROSCOPIC
Bacteria, UA: NONE SEEN
Bilirubin Urine: NEGATIVE
Glucose, UA: >=500 mg/dL — AB
Hgb urine dipstick: NEGATIVE
Ketones, ur: 5 mg/dL — AB
Leukocytes,Ua: NEGATIVE
Nitrite: NEGATIVE
Protein, ur: NEGATIVE mg/dL
Specific Gravity, Urine: 1.039 — ABNORMAL HIGH (ref 1.005–1.030)
pH: 6 (ref 5.0–8.0)

## 2022-01-19 LAB — BRAIN NATRIURETIC PEPTIDE: B Natriuretic Peptide: 200 pg/mL — ABNORMAL HIGH (ref 0.0–100.0)

## 2022-01-19 LAB — CBC
HCT: 40.5 % (ref 39.0–52.0)
Hemoglobin: 13.1 g/dL (ref 13.0–17.0)
MCH: 31 pg (ref 26.0–34.0)
MCHC: 32.3 g/dL (ref 30.0–36.0)
MCV: 95.7 fL (ref 80.0–100.0)
Platelets: 155 10*3/uL (ref 150–400)
RBC: 4.23 MIL/uL (ref 4.22–5.81)
RDW: 13.9 % (ref 11.5–15.5)
WBC: 12.3 10*3/uL — ABNORMAL HIGH (ref 4.0–10.5)
nRBC: 0 % (ref 0.0–0.2)

## 2022-01-19 LAB — RESP PANEL BY RT-PCR (FLU A&B, COVID) ARPGX2
Influenza A by PCR: NEGATIVE
Influenza B by PCR: NEGATIVE
SARS Coronavirus 2 by RT PCR: NEGATIVE

## 2022-01-19 LAB — D-DIMER, QUANTITATIVE: D-Dimer, Quant: 1.56 ug/mL-FEU — ABNORMAL HIGH (ref 0.00–0.50)

## 2022-01-19 LAB — MAGNESIUM: Magnesium: 2.3 mg/dL (ref 1.7–2.4)

## 2022-01-19 LAB — PROTIME-INR
INR: 1.6 — ABNORMAL HIGH (ref 0.8–1.2)
Prothrombin Time: 18.7 seconds — ABNORMAL HIGH (ref 11.4–15.2)

## 2022-01-19 MED ORDER — SODIUM CHLORIDE 0.9 % IV SOLN
500.0000 mg | INTRAVENOUS | Status: DC
  Administered 2022-01-20: 500 mg via INTRAVENOUS
  Filled 2022-01-19: qty 5

## 2022-01-19 MED ORDER — WARFARIN - PHARMACIST DOSING INPATIENT: Freq: Every day | Status: DC

## 2022-01-19 MED ORDER — POLYETHYLENE GLYCOL 3350 17 G PO PACK: 17.0000 g | PACK | Freq: Every day | ORAL | Status: DC | PRN

## 2022-01-19 MED ORDER — LIDOCAINE-PRILOCAINE 2.5-2.5 % EX CREA
TOPICAL_CREAM | Freq: Four times a day (QID) | CUTANEOUS | Status: DC | PRN
Start: 2022-01-19 — End: 2022-01-21
  Filled 2022-01-19: qty 5

## 2022-01-19 MED ORDER — ONDANSETRON HCL 4 MG PO TABS: 4.0000 mg | ORAL_TABLET | Freq: Four times a day (QID) | ORAL | Status: DC | PRN

## 2022-01-19 MED ORDER — METOPROLOL SUCCINATE ER 50 MG PO TB24
50.0000 mg | ORAL_TABLET | Freq: Every day | ORAL | Status: DC
  Administered 2022-01-20 – 2022-01-21 (×2): 50 mg via ORAL
  Filled 2022-01-19 (×2): qty 1

## 2022-01-19 MED ORDER — WARFARIN SODIUM 5 MG PO TABS
5.0000 mg | ORAL_TABLET | Freq: Once | ORAL | Status: AC
  Administered 2022-01-19: 5 mg via ORAL
  Filled 2022-01-19: qty 1

## 2022-01-19 MED ORDER — GUAIFENESIN-DM 100-10 MG/5ML PO SYRP
10.0000 mL | ORAL_SOLUTION | Freq: Four times a day (QID) | ORAL | Status: AC
  Administered 2022-01-19 – 2022-01-20 (×5): 10 mL via ORAL
  Filled 2022-01-19 (×4): qty 10

## 2022-01-19 MED ORDER — SODIUM CHLORIDE 0.9 % IV SOLN
500.0000 mg | Freq: Once | INTRAVENOUS | Status: AC
  Administered 2022-01-19: 500 mg via INTRAVENOUS
  Filled 2022-01-19: qty 5

## 2022-01-19 MED ORDER — ONDANSETRON HCL 4 MG/2ML IJ SOLN: 4.0000 mg | Freq: Four times a day (QID) | INTRAMUSCULAR | Status: DC | PRN

## 2022-01-19 MED ORDER — CEFTRIAXONE SODIUM 1 G IJ SOLR
1.0000 g | Freq: Once | INTRAMUSCULAR | Status: AC
  Administered 2022-01-19: 1 g via INTRAVENOUS
  Filled 2022-01-19: qty 10

## 2022-01-19 MED ORDER — ENSURE ENLIVE PO LIQD
237.0000 mL | Freq: Two times a day (BID) | ORAL | Status: DC
  Administered 2022-01-20: 237 mL via ORAL

## 2022-01-19 MED ORDER — ACETAMINOPHEN 650 MG RE SUPP: 650.0000 mg | Freq: Four times a day (QID) | RECTAL | Status: DC | PRN

## 2022-01-19 MED ORDER — ENOXAPARIN SODIUM 40 MG/0.4ML IJ SOSY: 40.0000 mg | PREFILLED_SYRINGE | INTRAMUSCULAR | Status: DC

## 2022-01-19 MED ORDER — ALLOPURINOL 100 MG PO TABS
300.0000 mg | ORAL_TABLET | Freq: Every day | ORAL | Status: DC
  Administered 2022-01-19 – 2022-01-21 (×3): 300 mg via ORAL
  Filled 2022-01-19 (×3): qty 3

## 2022-01-19 MED ORDER — ONDANSETRON HCL 4 MG/2ML IJ SOLN: 4.0000 mg | Freq: Once | INTRAMUSCULAR | Status: DC

## 2022-01-19 MED ORDER — ACETAMINOPHEN 325 MG PO TABS: 650.0000 mg | ORAL_TABLET | Freq: Four times a day (QID) | ORAL | Status: DC | PRN

## 2022-01-19 MED ORDER — IOHEXOL 350 MG/ML SOLN
80.0000 mL | Freq: Once | INTRAVENOUS | Status: AC | PRN
  Administered 2022-01-19: 80 mL via INTRAVENOUS

## 2022-01-19 MED ORDER — LACTATED RINGERS IV BOLUS
1000.0000 mL | Freq: Once | INTRAVENOUS | Status: AC
  Administered 2022-01-19: 1000 mL via INTRAVENOUS

## 2022-01-19 MED ORDER — SODIUM CHLORIDE 0.9 % IV SOLN
2.0000 g | INTRAVENOUS | Status: DC
  Administered 2022-01-20: 2 g via INTRAVENOUS
  Filled 2022-01-19: qty 20

## 2022-01-19 NOTE — Assessment & Plan Note (Addendum)
-  With productive cough and difficulty breathing, hypoxic with exertion at time of admission -CT angiogram demonstrating interstitial left lung pneumonia; negative PE -Continue bronchodilators -Started flutter valve -Continue the use of Pulmicort and supportive care -Continue Rocephin and Zithromax as antibiotic protocol for pneumonia.   -Discharge home with 5 additional days of cefdinir and azithromycin to complete 1 week

## 2022-01-19 NOTE — ED Notes (Signed)
Pt gone to CT 

## 2022-01-19 NOTE — Assessment & Plan Note (Addendum)
-  Stage IVb high-grade B-cell lymphoma, follows with Dr. Delton Coombes, currently on chemotherapy last chemo 01/08/22. -Continue outpatient follow-up with oncology service.

## 2022-01-19 NOTE — H&P (Signed)
History and Physical    RIGLEY NIESS QTM:[id number] DOB: 30-Jun-1946 DOA: 01/19/2022  PCP: Biagio Borg, MD   Patient coming from: Home  I have personally briefly reviewed patient's old medical records in [place]  Chief Complaint: Cough, difficulty breathing  HPI: [name] [name] is a 76 y.o. male with medical history significant for lymphoma on chemotherapy, hypertension, CABG, ischemic cardiomyopathy, OSA. Patient presented to the ED with complaints of difficulty breathing and cough.  Symptoms started a week ago and have gradually worsened.  He reports his cough is now productive of thick black-yellowish mucus.  He denies fever or chills.  1 episode of vomiting, no loose stools.  No abdominal pain.  He reports urinary urgency over the past week, but denies pain with urination.  ED Course: Temperature 98.7.  Heart rate 70s to 80s.  Respiratory rate 17-21.  Blood pressure systolic 625 638.  O2 sats 93 to 97% on room air, with ambulation O2 sats dropped to 83%. WBC 12.3.  D-dimer elevated at 1.56.  CTA chest shows left upper lobe left lower lobe and right lower lobe scarring or interstitial pneumonia. IV ceftriaxone and azithromycin started.  Review of Systems: As per HPI all other systems reviewed and negative.  Past Medical History:  Diagnosis Date   ALLERGIC RHINITIS 10/31/2007   Qualifier: Diagnosis of  By: Jenny Reichmann MD, [place] Oris    Allergy    CHOLECYSTECTOMY, HX OF 06/04/2007   Qualifier: Diagnosis of  By: Danny Lawless CMA, [place]     COLONIC POLYPS, HX OF 10/31/2007   Qualifier: Diagnosis of  By: Jenny Reichmann MD, [place] GRAFT, HX OF 06/04/2007   Qualifier: Diagnosis of  By: [place], [place]     CORONARY ARTERY DISEASE 06/04/2007   Qualifier: Diagnosis of  By: Danny Lawless CMA, [place]     Diverticulosis    Erectile dysfunction    Eye twitch    right eye since chilhood    HYPERLIPIDEMIA 06/04/2007   Qualifier: Diagnosis of  By: [place], [place]      Hypersomnolence    HYPERTENSION 06/04/2007   Qualifier: Diagnosis of  By: [employer], [place]     Hypothyroidism    ISCHEMIC CARDIOMYOPATHY 06/04/2007   Qualifier: Diagnosis of  By: Danny Lawless CMA, [place]     Myocardial infarction ([place])    per patient , his occurred in 1998    Osteoarthritis    PLMD (periodic limb movement disorder)    Sleep apnea    no cpap' per patient , "i was checked for it and they said i didnt have it "    Vertigo    Vitamin D deficiency     Past Surgical History:  Procedure Laterality Date   BIOPSY  11/25/2021   Procedure: BIOPSY;  Surgeon: Jackquline Denmark, MD;  Location: [facility];  Service: Gastroenterology;;   CHOLECYSTECTOMY     COLONOSCOPY     COLONOSCOPY WITH PROPOFOL N/A 11/25/2021   Procedure: COLONOSCOPY WITH PROPOFOL;  Surgeon: Jackquline Denmark, MD;  Location: [facility];  Service: Gastroenterology;  Laterality: N/A;   CORONARY ARTERY BYPASS GRAFT     ESOPHAGOGASTRODUODENOSCOPY (EGD) WITH PROPOFOL N/A 11/25/2021   Procedure: ESOPHAGOGASTRODUODENOSCOPY (EGD) WITH PROPOFOL;  Surgeon: Jackquline Denmark, MD;  Location: [facility];  Service: Gastroenterology;  Laterality: N/A;   ICD IMPLANT N/A 01/30/2020   Procedure: ICD IMPLANT;  Surgeon: Thompson Grayer, MD;  Location: [facility];  Service: Cardiovascular;  Laterality: N/A;   IR  IMAGING GUIDED PORT INSERTION  12/04/2021   KNEE ARTHROPLASTY Left 08/05/2017   Procedure: LEFT TOTAL KNEE ARTHROPLASTY WITH COMPUTER NAVIGATION;  Surgeon: Rod Can, MD;  Location: [facility];  Service: Orthopedics;  Laterality: Left;  Needs RNFA   PENILE PROSTHESIS IMPLANT     POLYPECTOMY  11/25/2021   Procedure: POLYPECTOMY;  Surgeon: Jackquline Denmark, MD;  Location: [facility];  Service: Gastroenterology;;   REPLACEMENT TOTAL KNEE  06/12/2013   spinal cyst     THORACOTOMY     left anterior; wound exploration and debridement   TONSILLECTOMY AND ADENOIDECTOMY     age 26     reports that he quit smoking about 40 years  ago. His smoking use included cigarettes. He has a 68.00 pack-year smoking history. He has never used smokeless tobacco. He reports current alcohol use. He reports that he does not use drugs.  Allergies  Allergen Reactions   Ace Inhibitors Cough   Statins Other (See Comments)    Muscle pains    Family History  Problem Relation Age of Onset   Stomach cancer Mother        smokes   Lung cancer Father        chewed tobacco   Heart disease Brother        first MI at 26yo, now 55 for transplant list/ ICM   Pancreatic cancer Brother    Colon cancer Maternal Grandfather    COPD Son        was a smoker   Pancreatic cancer Maternal Uncle    Pancreatic cancer Cousin        mat side x 4   Esophageal cancer Neg Hx    Prostate cancer Neg Hx    Rectal cancer Neg Hx    Prior to Admission medications   Medication Sig Start Date End Date Taking? Authorizing Provider  allopurinol (ZYLOPRIM) 300 MG tablet Take 1 tablet (300 mg total) by mouth daily. 12/09/21  Yes Derek Jack, MD  ARTIFICIAL TEAR SOLUTION OP Place 1 drop into both eyes daily as needed (dry eyes).   Yes [provider]  Ascorbic Acid (VITA-C PO) Take 1,000 mg by mouth daily.   Yes [provider]  b complex vitamins capsule Take 1 capsule by mouth daily.   Yes [provider]  ezetimibe (ZETIA) 10 MG tablet TAKE 1 TABLET EVERY DAY Patient taking differently: Take by mouth every other day. 07/21/21  Yes [name], MD  furosemide (LASIX) 20 MG tablet Take 1 tablet (20 mg total) by mouth daily. 12/30/21  Yes Shirley Friar, PA-C  lidocaine-prilocaine (EMLA) cream Apply a small amount to port a cath site (do not rub in) and cover with plastic wrap 1 hour prior to infusion appointments 12/09/21  Yes Derek Jack, MD  metoprolol succinate (TOPROL-XL) 50 MG 24 hr tablet TAKE 1 TABLET DAILY. TAKE WITH OR IMMEDIATELY FOLLOWING A MEAL. 01/19/22  Yes [name], MD  Multiple  Vitamins-Minerals (MULTIVITAMIN WITH MINERALS) tablet Take 1 tablet by mouth daily. Centrum silver   Yes [provider]  prochlorperazine (COMPAZINE) 10 MG tablet Take 1 tablet (10 mg total) by mouth every 6 (six) hours as needed for nausea or vomiting. 12/03/21  Yes Derek Jack, MD  rosuvastatin (CRESTOR) 5 MG tablet TAKE 1 TABLET EVERY OTHER DAY 07/09/21  Yes [name], MD  spironolactone (ALDACTONE) 25 MG tablet Take 0.5 tablets (12.5 mg total) by mouth daily. Patient taking differently: Take 25 mg by mouth  daily. 01/14/21 01/19/22 Yes [name], MD  triamcinolone (NASACORT) 55 MCG/ACT AERO nasal inhaler Place 2 sprays into the nose daily. Patient taking differently: Place 2 sprays into the nose daily as needed (allergies). 03/22/19  Yes Biagio Borg, MD  valsartan (DIOVAN) 160 MG tablet Take 1 tablet (160 mg total) by mouth daily. 11/06/21  Yes [name], MD  VITAMIN D PO Take 1,000 Units by mouth daily.   Yes [provider]  warfarin (COUMADIN) 5 MG tablet TAKE 1 TABLET EVERY DAY AT 4 PM Patient taking differently: Take 2.5-5 mg by mouth See admin instructions. Taking 5 mg every day except on Friday taking 1/2 tablet = 2.5 mg 05/29/21  Yes Biagio Borg, MD  empagliflozin (JARDIANCE) 10 MG TABS tablet Take 1 tablet (10 mg total) by mouth daily before breakfast. Patient not taking: Reported on 01/19/2022 05/08/21   [name], MD  pantoprazole (PROTONIX) 40 MG tablet Take 1 tablet (40 mg total) by mouth daily. Patient not taking: Reported on 01/19/2022 11/25/21 11/25/22  Jackquline Denmark, MD  predniSONE (DELTASONE) 20 MG tablet Take 5 tablets (100 mg) by mouth daily starting on the first day each treatment and the four days following. DO NOT take on weeks you are not receiving treatment Patient not taking: Reported on 01/19/2022 12/03/21   Derek Jack, MD    Physical Exam: Vitals:   01/19/22 1230 01/19/22 1404 01/19/22 1430 01/19/22 1535   BP: 117/79 120/80 120/60   Pulse: 77 76 77   Resp:  (!) 21 17   Temp:    98.4 F (36.9 C)  TempSrc:    Oral  SpO2: 93% 96% 96%   Weight:      Height:        Constitutional: NAD, calm, comfortable Vitals:   01/19/22 1230 01/19/22 1404 01/19/22 1430 01/19/22 1535  BP: 117/79 120/80 120/60   Pulse: 77 76 77   Resp:  (!) 21 17   Temp:    98.4 F (36.9 C)  TempSrc:    Oral  SpO2: 93% 96% 96%   Weight:      Height:       Eyes: PERRL, lids and conjunctivae normal ENMT: Mucous membranes are moist.   Neck: normal, supple, no masses, no thyromegaly Respiratory: clear to auscultation bilaterally, no wheezing, no crackles. Normal respiratory effort. No accessory muscle use.  Cardiovascular: Regular rate and rhythm, no murmurs / rubs / gallops. No extremity edema.  Lower extremities war. Port right upper chest, no surrounding erythema or drainage. Abdomen: no tenderness, no masses palpated. No hepatosplenomegaly. Bowel sounds positive.  Musculoskeletal: no clubbing / cyanosis. No joint deformity upper and lower extremities.  Skin: no rashes, lesions, ulcers. No induration Neurologic: No apparent cranial nerve abnormality moving extremities spontaneously.  Psychiatric: Normal judgment and insight. Alert and oriented x 3. Normal mood.   Labs on Admission: I have personally reviewed following labs and imaging studies  CBC: Recent Labs  Lab 01/19/22 1108  WBC 12.3*  HGB 13.1  HCT 40.5  MCV 95.7  PLT 294   Basic Metabolic Panel: Recent Labs  Lab 01/19/22 1108  NA 136  K 4.8  CL 101  CO2 28  GLUCOSE 150*  BUN 26*  CREATININE 1.35*  CALCIUM 8.8*  MG 2.3   GFR: Estimated Creatinine Clearance: 52.2 mL/min (A) (by C-G formula based on SCr of 1.35 mg/dL (H)). Liver Function Tests: Recent Labs  Lab 01/19/22 1108  AST 44*  ALT  67*  ALKPHOS 123  BILITOT 1.1  PROT 6.9  ALBUMIN 3.1*   Coagulation Profile: Recent Labs  Lab 01/19/22 1108  INR 1.6*   Urine  analysis:    Component Value Date/Time   COLORURINE YELLOW 01/19/2022 1109   APPEARANCEUR CLEAR 01/19/2022 1109   LABSPEC 1.039 (H) 01/19/2022 1109   PHURINE 6.0 01/19/2022 1109   GLUCOSEU >=500 (A) 01/19/2022 1109   GLUCOSEU >=1000 (A) 10/09/2021 1107   HGBUR NEGATIVE 01/19/2022 1109   BILIRUBINUR NEGATIVE 01/19/2022 1109   BILIRUBINUR negative 09/29/2016 1413   KETONESUR 5 (A) 01/19/2022 1109   PROTEINUR NEGATIVE 01/19/2022 1109   UROBILINOGEN 0.2 10/09/2021 1107   NITRITE NEGATIVE 01/19/2022 1109   LEUKOCYTESUR NEGATIVE 01/19/2022 1109    Radiological Exams on Admission: CT Angio Chest PE W and/or Wo Contrast  Result Date: 01/19/2022 CLINICAL DATA:  Cough, shortness of breath EXAM: CT ANGIOGRAPHY CHEST WITH CONTRAST TECHNIQUE: Multidetector CT imaging of the chest was performed using the standard protocol during bolus administration of intravenous contrast. Multiplanar CT image reconstructions and MIPs were obtained to evaluate the vascular anatomy. RADIATION DOSE REDUCTION: This exam was performed according to the departmental dose-optimization program which includes automated exposure control, adjustment of the mA and/or kV according to patient size and/or use of iterative reconstruction technique. CONTRAST:  66m OMNIPAQUE IOHEXOL 350 MG/ML SOLN COMPARISON:  Previous studies including chest radiographs done earlier today FINDINGS: Cardiovascular: There is homogeneous enhancement in thoracic aorta. There is ectasia of ascending thoracic aorta measuring 3.8 cm. Coronary artery calcifications are seen. There are no intraluminal filling defects in the pulmonary artery branches. Mediastinum/Nodes: No significant lymphadenopathy seen. Lungs/Pleura: Increased interstitial markings are seen in the left upper lobe with tree-in-bud appearance. Small patchy infiltrate is seen in the posterior right lower lung fields. Small patchy infiltrate is noted in the right posterior costophrenic angle. Small  patchy infiltrates are seen in the left lower lobe. There is no focal pulmonary consolidation. There is no pleural effusion or pneumothorax. Upper Abdomen: Surgical clips are seen in gallbladder fossa. Musculoskeletal: Unremarkable. Review of the MIP images confirms the above findings. IMPRESSION: There is no evidence of pulmonary artery embolism. Coronary artery calcifications are seen. There is ectasia of ascending thoracic aorta measuring 3.8 cm. There is no evidence of thoracic aortic dissection. There is no focal pulmonary consolidation. Small scattered foci of increased interstitial markings are seen in the left upper lobe and both lower lobes. This finding may suggest scarring or interstitial pneumonia. Electronically Signed   By: PElmer PickerM.D.   On: 01/19/2022 13:25   DG Chest Port 1 View  Result Date: 01/19/2022 CLINICAL DATA:  Chest pain and shortness of breath.  Cough. EXAM: PORTABLE CHEST 1 VIEW COMPARISON:  10/09/2021 FINDINGS: The heart size and mediastinal contours are within normal limits. Pacemaker remains in stable position. New right-sided power port is seen with tip overlying the distal SVC. No evidence of pneumothorax. Both lungs are clear. The visualized skeletal structures are unremarkable. IMPRESSION: New right-sided power port in appropriate position. No active cardiopulmonary disease. Electronically Signed   By: JMarlaine HindM.D.   On: 01/19/2022 11:33    EKG: Independently reviewed.  Sinus rhythm rate 80, QTc 476.  PVC and PACs present.  No significant change from prior  Assessment/Plan Principal Problem:   PNA (pneumonia) Active Problems:   Essential hypertension   Ischemic cardiomyopathy   High grade B-cell lymphoma (HCC)   Acute respiratory failure with hypoxia (HCC)    Assessment and  Plan: * PNA (pneumonia) With productive cough and difficulty breathing, hypoxic with exertion.  CTA chest suggest interstitial pneumonia.  Rules out for sepsis.  Afebrile.   WBC 10.3. -IV ceftriaxone and azithromycin -Mucolytic's  Acute respiratory failure with hypoxia (HCC) Likely secondary to pneumonia.  O2 sats 20 to 83% on room air with ambulation. -Supplemental O2  High grade B-cell lymphoma (HCC) Stage IVb high-grade B-cell lymphoma, follows with Dr. Delton Coombes, currently on chemotherapy last chemo 01/08/22.  Chronic anticoagulation For ?? Apical thrombus. Resume warfarin. INR 1.6.   Ischemic cardiomyopathy Stable and compensated.  Home med list has Lasix '20mg'$  and spironolactone.  Last echo 01/2022 EF of 30 to 35%.  Essential hypertension Stable. -Hold valsartan, spironolactone, Lasix 20 mg with contrast exposure - Resume metoprolol    DVT prophylaxis: Lovenox Code Status: Full Family Communication: Spouse at bedside Disposition Plan: ~ 2 days Consults called: None Admission status:  Inpt tele I certify that at the point of admission it is my clinical judgment that the patient will require inpatient hospital care spanning beyond 2 midnights from the point of admission due to high intensity of service, high risk for further deterioration and high frequency of surveillance required. T   Author: Bethena Roys, MD 01/19/2022 8:04 PM  For on call review [email or web address].

## 2022-01-19 NOTE — ED Notes (Signed)
Pt back from CT

## 2022-01-19 NOTE — ED Notes (Signed)
Pt O2 sats 85% with transitioning from supine position to sitting position, pt remained at 88% for about 2-3 mins, pt sats 95% while sitting on the edge of the bed, the pt walked around the nurses station with sats @ 83%, O2 sats returned to 92% after returning to bed, Doren Custard, MD aware

## 2022-01-19 NOTE — ED Notes (Signed)
Pt linens and gown changed after pt missed using urinal

## 2022-01-19 NOTE — Assessment & Plan Note (Addendum)
-  Stable and compensated.   -Continue to be judicious with fluid resuscitation -Restart Lasix after initially holding secondary to contrast exposure -Recent echo in January 12, 2022 demonstrating ejection fraction of 30-35%  -Daily weights, strict I's and O's and continue use of metoprolol will be provided.   -Restart spironolactone and valsartan at lower dose

## 2022-01-19 NOTE — Progress Notes (Addendum)
ANTICOAGULATION CONSULT NOTE - Initial Consult  Pharmacy Consult for warfarin Indication:  hx apical thrombus  Allergies  Allergen Reactions   Ace Inhibitors Cough   Statins Other (See Comments)    Muscle pains    Patient Measurements: Height: '5\' 8"'$  (172.7 cm) Weight: 83.9 kg (184 lb 15.5 oz) IBW/kg (Calculated) : 68.4 Heparin Dosing Weight:   Vital Signs: Temp: 97.9 F (36.6 C) (06/12 1802) Temp Source: Oral (06/12 1802) BP: 113/74 (06/12 1802) Pulse Rate: 98 (06/12 1802)  Labs: Recent Labs    01/19/22 1108  HGB 13.1  HCT 40.5  PLT 155  LABPROT 18.7*  INR 1.6*  CREATININE 1.35*    Estimated Creatinine Clearance: 49.1 mL/min (A) (by C-G formula based on SCr of 1.35 mg/dL (H)).   Medical History: Past Medical History:  Diagnosis Date   ALLERGIC RHINITIS 10/31/2007   Qualifier: Diagnosis of  By: Jenny Reichmann MD, [place]is    Allergy    CHOLECYSTECTOMY, HX OF 06/04/2007   Qualifier: Diagnosis of  By: Danny Lawless CMA, [place]     COLONIC POLYPS, HX OF 10/31/2007   Qualifier: Diagnosis of  By: Jenny Reichmann MD, [place] GRAFT, HX OF 06/04/2007   Qualifier: Diagnosis of  By: [place], [place]     CORONARY ARTERY DISEASE 06/04/2007   Qualifier: Diagnosis of  By: Danny Lawless CMA, [place]     Diverticulosis    Erectile dysfunction    Eye twitch    right eye since chilhood    HYPERLIPIDEMIA 06/04/2007   Qualifier: Diagnosis of  By: [place], [place]     Hypersomnolence    HYPERTENSION 06/04/2007   Qualifier: Diagnosis of  By: [place], [place]     Hypothyroidism    ISCHEMIC CARDIOMYOPATHY 06/04/2007   Qualifier: Diagnosis of  By: Danny Lawless CMA, [place]     Myocardial infarction ([place])    per patient , his occurred in 1998    Osteoarthritis    PLMD (periodic limb movement disorder)    Sleep apnea    no cpap' per patient , "i was checked for it and they said i didnt have it "    Vertigo    Vitamin D deficiency     Medications:  Medications Prior to  Admission  Medication Sig Dispense Refill Last Dose   allopurinol (ZYLOPRIM) 300 MG tablet Take 1 tablet (300 mg total) by mouth daily. 30 tablet 3 Past Week   ARTIFICIAL TEAR SOLUTION OP Place 1 drop into both eyes daily as needed (dry eyes).   unk   Ascorbic Acid (VITA-C PO) Take 1,000 mg by mouth daily.   01/18/2022   b complex vitamins capsule Take 1 capsule by mouth daily.   01/18/2022   ezetimibe (ZETIA) 10 MG tablet TAKE 1 TABLET EVERY DAY (Patient taking differently: Take by mouth every other day.) 90 tablet 3 Past Week   furosemide (LASIX) 20 MG tablet Take 1 tablet (20 mg total) by mouth daily. 90 tablet 3 01/18/2022   lidocaine-prilocaine (EMLA) cream Apply a small amount to port a cath site (do not rub in) and cover with plastic wrap 1 hour prior to infusion appointments 30 g 3 Past Week   metoprolol succinate (TOPROL-XL) 50 MG 24 hr tablet TAKE 1 TABLET DAILY. TAKE WITH OR IMMEDIATELY FOLLOWING A MEAL. 90 tablet 1 01/19/2022 at 0800   Multiple Vitamins-Minerals (MULTIVITAMIN WITH MINERALS) tablet Take 1 tablet by mouth daily. Centrum silver   01/18/2022   prochlorperazine (COMPAZINE) 10  MG tablet Take 1 tablet (10 mg total) by mouth every 6 (six) hours as needed for nausea or vomiting. 30 tablet 3 unk   rosuvastatin (CRESTOR) 5 MG tablet TAKE 1 TABLET EVERY OTHER DAY 45 tablet 10 01/18/2022   spironolactone (ALDACTONE) 25 MG tablet Take 0.5 tablets (12.5 mg total) by mouth daily. (Patient taking differently: Take 25 mg by mouth daily.) 45 tablet 3 01/19/2022   triamcinolone (NASACORT) 55 MCG/ACT AERO nasal inhaler Place 2 sprays into the nose daily. (Patient taking differently: Place 2 sprays into the nose daily as needed (allergies).) 3 Inhaler 3 unk   valsartan (DIOVAN) 160 MG tablet Take 1 tablet (160 mg total) by mouth daily. 90 tablet 3 01/19/2022   VITAMIN D PO Take 1,000 Units by mouth daily.   01/18/2022   warfarin (COUMADIN) 5 MG tablet TAKE 1 TABLET EVERY DAY AT 4 PM (Patient taking  differently: Take 2.5-5 mg by mouth See admin instructions. Taking 5 mg every day except on Friday taking 1/2 tablet = 2.5 mg) 90 tablet 4 01/18/2022 at 0400   empagliflozin (JARDIANCE) 10 MG TABS tablet Take 1 tablet (10 mg total) by mouth daily before breakfast. (Patient not taking: Reported on 01/19/2022) 30 tablet 11 Not Taking   pantoprazole (PROTONIX) 40 MG tablet Take 1 tablet (40 mg total) by mouth daily. (Patient not taking: Reported on 01/19/2022) 90 tablet 3 Not Taking   predniSONE (DELTASONE) 20 MG tablet Take 5 tablets (100 mg) by mouth daily starting on the first day each treatment and the four days following. DO NOT take on weeks you are not receiving treatment (Patient not taking: Reported on 01/19/2022) 150 tablet 0 Not Taking    Assessment: Pharmacy consulted to dose warfarin in patient with history of apical thrombus.  Home dose listed as 2.5 mg on MWF and 5 mg ROW per anti-coag clinic note on 6/1. Admission INR 1.6 with last dose 6/11.  Potential drug-drug interaction with azithromycin- will monitor closely.  Goal of Therapy:  INR 2-3 Monitor platelets by anticoagulation protocol: Yes   Plan:  Warfarin 5 mg x 1 dose Monitor daily INR and s/s of bleeding.  Margot Ables, PharmD Clinical Pharmacist 01/19/2022 8:12 PM

## 2022-01-19 NOTE — Telephone Encounter (Signed)
Patient called stating that he has had cough and SOB since last Tuesday that has progressed to include; worsening SOB, coughing up thick black and yellow mucus.  Has not taken temperature, however has chills and hot spells.  Advised to go to the ER to be evaluated since symptoms are getting worse and have not been addressed.  Patient currently on treatment, with last infusion on 01/08/22.  Verbalized understanding.

## 2022-01-19 NOTE — Assessment & Plan Note (Addendum)
-  Likely secondary to pneumonia.   -Good saturation appreciated at rest currently -Will reassess desaturation screening -Continue treatment for pneumonia as mentioned in principal problem.

## 2022-01-19 NOTE — Assessment & Plan Note (Addendum)
-  Chronically on warfarin for Apical thrombus. -Pharmacy adjusting warfarin dose -Continue to follow prothrombin time and INR. -INR goal 2-3.

## 2022-01-19 NOTE — Assessment & Plan Note (Addendum)
-  Soft, but stable overall. -Continue to hold hold valsartan -Planning to resume the use of Lasix and spironolactone in the next 24 hours (as long as his vital signs will be able to tolerate it). -Continue metoprolol.

## 2022-01-19 NOTE — ED Triage Notes (Signed)
Pt to the ED with complaints of shortness of breath, productive cough, chills, and weakness since last Tuesday.  Pt was referred to the ED after calling the [facility].

## 2022-01-19 NOTE — ED Provider Notes (Signed)
[facility] EMERGENCY DEPARTMENT Provider Note   CSN: [id number] Arrival date & time: 01/19/22  1044     History  Chief Complaint  Patient presents with   Shortness of Breath    [name] [name] is a 76 y.o. male.   Shortness of Breath Associated symptoms: cough   Patient presents for shortness of breath.  Additional symptoms include cough, chills, and generalized weakness.  Symptoms have been present over the past week.  Medical history includes arthritis, HLD, HTN, CAD s/p CABG, hypothyroidism and high-grade B-cell lymphoma.  Lymphoma was diagnosed this year.  He is followed by Dr. Raliegh Ip for his lymphoma.  He is currently undergoing chemotherapy (R-CEOP).  Chemotherapy is done every 3 weeks.  His last session was 2 weeks ago.  Patient has no known chronic lung disease.  He describes the sputum as dark and sometimes black in color.  He has had very poor p.o. intake secondary to complete loss of appetite.  He currently denies any nausea or abdominal discomfort.  He has not had any chest pain.     Home Medications Prior to Admission medications   Medication Sig Start Date End Date Taking? Authorizing Provider  allopurinol (ZYLOPRIM) 300 MG tablet Take 1 tablet (300 mg total) by mouth daily. 12/09/21  Yes Derek Jack, MD  ARTIFICIAL TEAR SOLUTION OP Place 1 drop into both eyes daily as needed (dry eyes).   Yes [provider]  Ascorbic Acid (VITA-C PO) Take 1,000 mg by mouth daily.   Yes [provider]  b complex vitamins capsule Take 1 capsule by mouth daily.   Yes [provider]  ezetimibe (ZETIA) 10 MG tablet TAKE 1 TABLET EVERY DAY Patient taking differently: Take by mouth every other day. 07/21/21  Yes [name], MD  furosemide (LASIX) 20 MG tablet Take 1 tablet (20 mg total) by mouth daily. 12/30/21  Yes Shirley Friar, PA-C  lidocaine-prilocaine (EMLA) cream Apply a small amount to port a cath site (do not rub in) and cover with  plastic wrap 1 hour prior to infusion appointments 12/09/21  Yes Derek Jack, MD  metoprolol succinate (TOPROL-XL) 50 MG 24 hr tablet TAKE 1 TABLET DAILY. TAKE WITH OR IMMEDIATELY FOLLOWING A MEAL. 01/19/22  Yes [name], MD  Multiple Vitamins-Minerals (MULTIVITAMIN WITH MINERALS) tablet Take 1 tablet by mouth daily. Centrum silver   Yes [provider]  prochlorperazine (COMPAZINE) 10 MG tablet Take 1 tablet (10 mg total) by mouth every 6 (six) hours as needed for nausea or vomiting. 12/03/21  Yes Derek Jack, MD  rosuvastatin (CRESTOR) 5 MG tablet TAKE 1 TABLET EVERY OTHER DAY 07/09/21  Yes [name], MD  spironolactone (ALDACTONE) 25 MG tablet Take 0.5 tablets (12.5 mg total) by mouth daily. Patient taking differently: Take 25 mg by mouth daily. 01/14/21 01/19/22 Yes [name], MD  triamcinolone (NASACORT) 55 MCG/ACT AERO nasal inhaler Place 2 sprays into the nose daily. Patient taking differently: Place 2 sprays into the nose daily as needed (allergies). 03/22/19  Yes Biagio Borg, MD  valsartan (DIOVAN) 160 MG tablet Take 1 tablet (160 mg total) by mouth daily. 11/06/21  Yes [name], MD  VITAMIN D PO Take 1,000 Units by mouth daily.   Yes [provider]  warfarin (COUMADIN) 5 MG tablet TAKE 1 TABLET EVERY DAY AT 4 PM Patient taking differently: Take 2.5-5 mg by mouth See admin instructions. Taking 5 mg every day except on Friday taking  1/2 tablet = 2.5 mg 05/29/21  Yes Biagio Borg, MD  empagliflozin (JARDIANCE) 10 MG TABS tablet Take 1 tablet (10 mg total) by mouth daily before breakfast. Patient not taking: Reported on 01/19/2022 05/08/21   [name], MD  pantoprazole (PROTONIX) 40 MG tablet Take 1 tablet (40 mg total) by mouth daily. Patient not taking: Reported on 01/19/2022 11/25/21 11/25/22  Jackquline Denmark, MD  predniSONE (DELTASONE) 20 MG tablet Take 5 tablets (100 mg) by mouth daily starting on the first day each  treatment and the four days following. DO NOT take on weeks you are not receiving treatment Patient not taking: Reported on 01/19/2022 12/03/21   Derek Jack, MD      Allergies    Ace inhibitors and Statins    Review of Systems   Review of Systems  Constitutional:  Positive for activity change, appetite change, chills and fatigue.  Respiratory:  Positive for cough and shortness of breath.   Neurological:  Positive for weakness (Generalized).  All other systems reviewed and are negative.   Physical Exam Updated Vital Signs BP 112/74   Pulse 74   Temp 98.4 F (36.9 C) (Oral)   Resp (!) 25   Ht '5\' 9"'$  (1.753 m)   Wt 92.1 kg   SpO2 94%   BMI 29.98 kg/m  Physical Exam Vitals and nursing note reviewed.  Constitutional:      General: He is not in acute distress.    Appearance: He is well-developed and normal weight. He is not ill-appearing, toxic-appearing or diaphoretic.  HENT:     Head: Normocephalic and atraumatic.     Mouth/Throat:     Mouth: Mucous membranes are moist.     Pharynx: Oropharynx is clear.  Eyes:     Conjunctiva/sclera: Conjunctivae normal.  Cardiovascular:     Rate and Rhythm: Normal rate and regular rhythm.     Heart sounds: No murmur heard. Pulmonary:     Effort: Pulmonary effort is normal. No tachypnea, accessory muscle usage or respiratory distress.     Breath sounds: Normal breath sounds. No decreased breath sounds, wheezing, rhonchi or rales.  Abdominal:     Palpations: Abdomen is soft.     Tenderness: There is no abdominal tenderness.  Musculoskeletal:        General: No swelling. Normal range of motion.     Cervical back: Normal range of motion and neck supple.     Right lower leg: No edema.     Left lower leg: No edema.  Skin:    General: Skin is warm and dry.     Capillary Refill: Capillary refill takes less than 2 seconds.     Coloration: Skin is not cyanotic or pale.  Neurological:     General: No focal deficit present.      Mental Status: He is alert and oriented to person, place, and time.     Cranial Nerves: No cranial nerve deficit.     Motor: No weakness.  Psychiatric:        Mood and Affect: Mood normal.        Behavior: Behavior normal.     ED Results / Procedures / Treatments   Labs (all labs ordered are listed, but only abnormal results are displayed) Labs Reviewed  COMPREHENSIVE METABOLIC PANEL - Abnormal; Notable for the following components:      Result Value   Glucose, Bld 150 (*)    BUN 26 (*)    Creatinine, Ser 1.35 (*)  Calcium 8.8 (*)    Albumin 3.1 (*)    AST 44 (*)    ALT 67 (*)    GFR, Estimated 54 (*)    All other components within normal limits  CBC - Abnormal; Notable for the following components:   WBC 12.3 (*)    All other components within normal limits  BRAIN NATRIURETIC PEPTIDE - Abnormal; Notable for the following components:   B Natriuretic Peptide 200.0 (*)    All other components within normal limits  PROTIME-INR - Abnormal; Notable for the following components:   Prothrombin Time 18.7 (*)    INR 1.6 (*)    All other components within normal limits  D-DIMER, QUANTITATIVE - Abnormal; Notable for the following components:   D-Dimer, Quant 1.56 (*)    All other components within normal limits  URINALYSIS, ROUTINE W REFLEX MICROSCOPIC - Abnormal; Notable for the following components:   Specific Gravity, Urine 1.039 (*)    Glucose, UA >=500 (*)    Ketones, ur 5 (*)    All other components within normal limits  RESP PANEL BY RT-PCR (FLU A&B, COVID) ARPGX2  MAGNESIUM    EKG None  Radiology CT Angio Chest PE W and/or Wo Contrast  Result Date: 01/19/2022 CLINICAL DATA:  Cough, shortness of breath EXAM: CT ANGIOGRAPHY CHEST WITH CONTRAST TECHNIQUE: Multidetector CT imaging of the chest was performed using the standard protocol during bolus administration of intravenous contrast. Multiplanar CT image reconstructions and MIPs were obtained to evaluate the vascular  anatomy. RADIATION DOSE REDUCTION: This exam was performed according to the departmental dose-optimization program which includes automated exposure control, adjustment of the mA and/or kV according to patient size and/or use of iterative reconstruction technique. CONTRAST:  68m OMNIPAQUE IOHEXOL 350 MG/ML SOLN COMPARISON:  Previous studies including chest radiographs done earlier today FINDINGS: Cardiovascular: There is homogeneous enhancement in thoracic aorta. There is ectasia of ascending thoracic aorta measuring 3.8 cm. Coronary artery calcifications are seen. There are no intraluminal filling defects in the pulmonary artery branches. Mediastinum/Nodes: No significant lymphadenopathy seen. Lungs/Pleura: Increased interstitial markings are seen in the left upper lobe with tree-in-bud appearance. Small patchy infiltrate is seen in the posterior right lower lung fields. Small patchy infiltrate is noted in the right posterior costophrenic angle. Small patchy infiltrates are seen in the left lower lobe. There is no focal pulmonary consolidation. There is no pleural effusion or pneumothorax. Upper Abdomen: Surgical clips are seen in gallbladder fossa. Musculoskeletal: Unremarkable. Review of the MIP images confirms the above findings. IMPRESSION: There is no evidence of pulmonary artery embolism. Coronary artery calcifications are seen. There is ectasia of ascending thoracic aorta measuring 3.8 cm. There is no evidence of thoracic aortic dissection. There is no focal pulmonary consolidation. Small scattered foci of increased interstitial markings are seen in the left upper lobe and both lower lobes. This finding may suggest scarring or interstitial pneumonia. Electronically Signed   By: PElmer PickerM.D.   On: 01/19/2022 13:25   DG Chest Port 1 View  Result Date: 01/19/2022 CLINICAL DATA:  Chest pain and shortness of breath.  Cough. EXAM: PORTABLE CHEST 1 VIEW COMPARISON:  10/09/2021 FINDINGS: The heart  size and mediastinal contours are within normal limits. Pacemaker remains in stable position. New right-sided power port is seen with tip overlying the distal SVC. No evidence of pneumothorax. Both lungs are clear. The visualized skeletal structures are unremarkable. IMPRESSION: New right-sided power port in appropriate position. No active cardiopulmonary disease. Electronically Signed  By: Marlaine Hind M.D.   On: 01/19/2022 11:33    Procedures Procedures    Medications Ordered in ED Medications  ondansetron (ZOFRAN) injection 4 mg (4 mg Intravenous Not Given 01/19/22 1631)  lactated ringers bolus 1,000 mL (0 mLs Intravenous Stopped 01/19/22 1652)  iohexol (OMNIPAQUE) 350 MG/ML injection 80 mL (80 mLs Intravenous Contrast Given 01/19/22 1258)  cefTRIAXone (ROCEPHIN) 1 g in sodium chloride 0.9 % 100 mL IVPB (0 g Intravenous Stopped 01/19/22 1455)  azithromycin (ZITHROMAX) 500 mg in sodium chloride 0.9 % 250 mL IVPB (0 mg Intravenous Stopped 01/19/22 1652)    ED Course/ Medical Decision Making/ A&P                           Medical Decision Making Amount and/or Complexity of Data Reviewed Labs: ordered. Radiology: ordered.  Risk Prescription drug management. Decision regarding hospitalization.   This patient presents to the ED for concern of productive cough, shortness of breath, fatigue, and generalized weakness, this involves an extensive number of treatment options, and is a complaint that carries with it a high risk of complications and morbidity.  The differential diagnosis includes pneumonia, CHF, undiagnosed reactive airway disease   Co morbidities that complicate the patient evaluation  arthritis, HLD, HTN, CAD s/p CABG, hypothyroidism and high-grade B-cell lymphoma   Additional history obtained:  Additional history obtained from patient's wife External records from outside source obtained and reviewed including EMR   Lab Tests:  I Ordered, and personally interpreted  labs.  The pertinent results include: Ptosis is present, patient's creatinine is improved from baseline, electrolytes are normal, D-dimer is mildly elevated, COVID and flu were negative, BNP is mildly elevated   Imaging Studies ordered:  I ordered imaging studies including chest x-ray, CTA chest I independently visualized and interpreted imaging which showed evidence of multifocal interstitial pneumonia I agree with the radiologist interpretation   Cardiac Monitoring: / EKG:  The patient was maintained on a cardiac monitor.  I personally viewed and interpreted the cardiac monitored which showed an underlying rhythm of: Sinus rhythm  Problem List / ED Course / Critical interventions / Medication management  Patient is a 76 year old male presenting for several days of cough productive of dark sputum, fatigue, shortness of breath, and exercise intolerance.  In addition to these recent symptoms, patient has had very poor p.o. intake.  He describes this is due to complete loss of appetite.  He has not had any areas of pain.  On arrival in the ED, patient is ill-appearing.  His vital signs are normal and his SPO2 is normal on room air.  He does not have evidence of wheeze on lung auscultation.  Diagnostic work-up was initiated.  Patient's lab work is notable for a leukocytosis and elevated D-dimer.  This prompted CTA of chest.  On CTA chest, there is evidence of multifocal interstitial pneumonia.  This is consistent with the patient's presentation.  Antibiotics were initiated.  Given his poor p.o. intake, patient was given IV fluids.  I had a shared decision-making discussion with the patient.  Given his normal vital signs, patient is potentially a candidate for outpatient therapy.  Zofran was ordered to help with p.o. intake.  During trial of ambulation, patient had severe fatigue, severe exertional dyspnea, as well as SPO2 drop into the low 80s.  Patient was admitted to hospitalist for further  management. I ordered medication including IV fluids for poor p.o. intake; ceftriaxone and azithromycin for  treatment of pneumonia Reevaluation of the patient after these medicines showed that the patient improved I have reviewed the patients home medicines and have made adjustments as needed   Social Determinants of Health:  Lives at home with wife, has access to outpatient care  CRITICAL CARE Performed by: Godfrey Pick   Total critical care time: 35 minutes  Critical care time was exclusive of separately billable procedures and treating other patients.  Critical care was necessary to treat or prevent imminent or life-threatening deterioration.  Critical care was time spent personally by me on the following activities: development of treatment plan with patient and/or surrogate as well as nursing, discussions with consultants, evaluation of patient's response to treatment, examination of patient, obtaining history from patient or surrogate, ordering and performing treatments and interventions, ordering and review of laboratory studies, ordering and review of radiographic studies, pulse oximetry and re-evaluation of patient's condition.         Final Clinical Impression(s) / ED Diagnoses Final diagnoses:  Multifocal pneumonia  Acute respiratory failure with hypoxia [facility])    Rx / DC Orders ED Discharge Orders     None         Godfrey Pick, MD 01/19/22 1755

## 2022-01-19 NOTE — Plan of Care (Signed)

## 2022-01-20 ENCOUNTER — Encounter (HOSPITAL_COMMUNITY): Payer: Self-pay | Admitting: Hematology

## 2022-01-20 DIAGNOSIS — I255 Ischemic cardiomyopathy: Secondary | ICD-10-CM

## 2022-01-20 DIAGNOSIS — J189 Pneumonia, unspecified organism: Secondary | ICD-10-CM | POA: Diagnosis not present

## 2022-01-20 DIAGNOSIS — J849 Interstitial pulmonary disease, unspecified: Secondary | ICD-10-CM

## 2022-01-20 DIAGNOSIS — I10 Essential (primary) hypertension: Secondary | ICD-10-CM | POA: Diagnosis not present

## 2022-01-20 DIAGNOSIS — Z7901 Long term (current) use of anticoagulants: Secondary | ICD-10-CM | POA: Diagnosis not present

## 2022-01-20 DIAGNOSIS — J9601 Acute respiratory failure with hypoxia: Secondary | ICD-10-CM | POA: Diagnosis not present

## 2022-01-20 LAB — CBC
HCT: 35.9 % — ABNORMAL LOW (ref 39.0–52.0)
Hemoglobin: 11.7 g/dL — ABNORMAL LOW (ref 13.0–17.0)
MCH: 31.1 pg (ref 26.0–34.0)
MCHC: 32.6 g/dL (ref 30.0–36.0)
MCV: 95.5 fL (ref 80.0–100.0)
Platelets: 149 10*3/uL — ABNORMAL LOW (ref 150–400)
RBC: 3.76 MIL/uL — ABNORMAL LOW (ref 4.22–5.81)
RDW: 13.9 % (ref 11.5–15.5)
WBC: 12.6 10*3/uL — ABNORMAL HIGH (ref 4.0–10.5)
nRBC: 0 % (ref 0.0–0.2)

## 2022-01-20 LAB — BASIC METABOLIC PANEL
Anion gap: 6 (ref 5–15)
BUN: 22 mg/dL (ref 8–23)
CO2: 28 mmol/L (ref 22–32)
Calcium: 8.6 mg/dL — ABNORMAL LOW (ref 8.9–10.3)
Chloride: 103 mmol/L (ref 98–111)
Creatinine, Ser: 1.08 mg/dL (ref 0.61–1.24)
GFR, Estimated: >60 mL/min (ref >60)
Glucose, Bld: 110 mg/dL — ABNORMAL HIGH (ref 70–99)
Potassium: 4.6 mmol/L (ref 3.5–5.1)
Sodium: 137 mmol/L (ref 135–145)

## 2022-01-20 LAB — PROTIME-INR
INR: 1.5 — ABNORMAL HIGH (ref 0.8–1.2)
Prothrombin Time: 18.4 seconds — ABNORMAL HIGH (ref 11.4–15.2)

## 2022-01-20 MED ORDER — BUDESONIDE 0.25 MG/2ML IN SUSP
0.2500 mg | Freq: Two times a day (BID) | RESPIRATORY_TRACT | Status: DC
  Administered 2022-01-20 – 2022-01-21 (×2): 0.25 mg via RESPIRATORY_TRACT
  Filled 2022-01-20 (×2): qty 2

## 2022-01-20 MED ORDER — CHLORHEXIDINE GLUCONATE CLOTH 2 % EX PADS
6.0000 | MEDICATED_PAD | Freq: Every day | CUTANEOUS | Status: DC
  Administered 2022-01-20 – 2022-01-21 (×2): 6 via TOPICAL

## 2022-01-20 MED ORDER — SPIRONOLACTONE 12.5 MG HALF TABLET
12.5000 mg | ORAL_TABLET | Freq: Every day | ORAL | Status: DC
  Administered 2022-01-21: 12.5 mg via ORAL
  Filled 2022-01-20: qty 1

## 2022-01-20 MED ORDER — SODIUM CHLORIDE 0.9 % IV SOLN: INTRAVENOUS | Status: DC

## 2022-01-20 MED ORDER — ADULT MULTIVITAMIN W/MINERALS CH
1.0000 | ORAL_TABLET | Freq: Every day | ORAL | Status: DC
  Administered 2022-01-20 – 2022-01-21 (×2): 1 via ORAL
  Filled 2022-01-20 (×2): qty 1

## 2022-01-20 MED ORDER — ENSURE ENLIVE PO LIQD
237.0000 mL | Freq: Three times a day (TID) | ORAL | Status: DC
  Administered 2022-01-20 – 2022-01-21 (×2): 237 mL via ORAL

## 2022-01-20 MED ORDER — WARFARIN SODIUM 5 MG PO TABS
5.0000 mg | ORAL_TABLET | Freq: Once | ORAL | Status: AC
  Administered 2022-01-20: 5 mg via ORAL
  Filled 2022-01-20: qty 1

## 2022-01-20 MED ORDER — SODIUM CHLORIDE 0.9 % IV SOLN: INTRAVENOUS | Status: AC

## 2022-01-20 MED ORDER — FUROSEMIDE 20 MG PO TABS
20.0000 mg | ORAL_TABLET | Freq: Every day | ORAL | Status: DC
  Administered 2022-01-21: 20 mg via ORAL
  Filled 2022-01-20: qty 1

## 2022-01-20 NOTE — Plan of Care (Signed)
  Problem: Acute Rehab PT Goals(only PT should resolve) Goal: Pt Will Go Supine/Side To Sit Outcome: Progressing Flowsheets (Taken 01/20/2022 1417) Pt will go Supine/Side to Sit:  with modified independence  Independently Goal: Patient Will Transfer Sit To/From Stand Outcome: Progressing Flowsheets (Taken 01/20/2022 1417) Patient will transfer sit to/from stand:  with modified independence  Independently Goal: Pt Will Transfer Bed To Chair/Chair To Bed Outcome: Progressing Flowsheets (Taken 01/20/2022 1417) Pt will Transfer Bed to Chair/Chair to Bed:  with modified independence  Independently Goal: Pt Will Perform Standing Balance Or Pre-Gait Outcome: Progressing Flowsheets (Taken 01/20/2022 1417) Pt will perform standing balance or pre-gait:  3- 5 min  with Supervision  with min guard assist Goal: Pt Will Ambulate Outcome: Progressing Flowsheets (Taken 01/20/2022 1417) Pt will Ambulate:  > 125 feet  with modified independence  with supervision Goal: Pt Will Go Up/Down Stairs Outcome: Progressing Flowsheets (Taken 01/20/2022 1417) Pt will Go Up / Down Stairs:  3-5 stairs  with modified independence  with supervision   2:18 PM, 01/20/22 Lestine Box, S/PT

## 2022-01-20 NOTE — Progress Notes (Signed)
Progress Note   Patient: [name] [name] EXN:[id number] DOB: May 08, 1946 DOA: 01/19/2022     1 DOS: the patient was seen and examined on 01/20/2022   Brief hospital course: As per H&P written by Dr. Denton Brick on 01/19/2022 [name] [name] is a 76 y.o. male with medical history significant for lymphoma on chemotherapy, hypertension, CABG, ischemic cardiomyopathy, OSA. Patient presented to the ED with complaints of difficulty breathing and cough.  Symptoms started a week ago and have gradually worsened.  He reports his cough is now productive of thick black-yellowish mucus.  He denies fever or chills.  1 episode of vomiting, no loose stools.  No abdominal pain.  He reports urinary urgency over the past week, but denies pain with urination.   ED Course: Temperature 98.7.  Heart rate 70s to 80s.  Respiratory rate 17-21.  Blood pressure systolic 496 759.  O2 sats 93 to 97% on room air, with ambulation O2 sats dropped to 83%. WBC 12.3.  D-dimer elevated at 1.56.  CTA chest shows left upper lobe left lower lobe and right lower lobe scarring or interstitial pneumonia. IV ceftriaxone and azithromycin started.  Assessment and Plan: * PNA (pneumonia) -With productive cough and difficulty breathing, hypoxic with exertion at time of admission -CT angiogram demonstrating interstitial left lung pneumonia.  -Continue as needed mucolytic's -Continue bronchodilators -Start flutter valve -Continue the use of Pulmicort and supportive care -Continue Rocephin and Zithromax as antibiotic protocol for pneumonia.     Acute respiratory failure with hypoxia (HCC) -Likely secondary to pneumonia.   -Good saturation appreciated at rest currently -Will reassess desaturation screening -Continue treatment for pneumonia as mentioned in principal problem.    High grade B-cell lymphoma (HCC) -Stage IVb high-grade B-cell lymphoma, follows with Dr. Delton Coombes, currently on chemotherapy last chemo 01/08/22. -Continue  outpatient follow-up with oncology service.  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 -Pharmacy adjusting warfarin dose -Continue to follow prothrombin time and INR. -INR goal 2-3.  Ischemic cardiomyopathy -Stable and compensated.   -Continue to be judicious with fluid resuscitation -Continue holding diuretic therapy at this point -Recent echo in June 2023 demonstrating ejection fraction of 30-35%  -Daily weights, strict I's and O's and continue use of metoprolol will be provided.   -Spironolactone and Diovan on hold after being expose to contrast.  Essential hypertension -Soft, but stable overall. -Continue to hold hold valsartan -Planning to resume the use of Lasix and spironolactone in the next 24 hours (as long as his vital signs will be able to tolerate it). -Continue metoprolol.    Subjective:  Afebrile, no chest pain, no nausea or vomiting currently.  Reports short winded sensation with activity, increased fatigability, weakness and deconditioning.  Physical Exam: Vitals:   01/19/22 1802 01/19/22 2024 01/20/22 0002 01/20/22 0413  BP: 113/74 100/66 105/75 113/80  Pulse: 98 (!) 51 95 91  Resp: '20 16 18 19  '$ Temp: 97.9 F (36.6 C) 98 F (36.7 C) 98.4 F (36.9 C) 98 F (36.7 C)  TempSrc: Oral Oral Oral Oral  SpO2: 97% 92% 92% 93%  Weight: 83.9 kg     Height: '5\' 8"'$  (1.727 m)      General exam: Alert, awake, oriented x 3; chronically ill in appearance, complaining of general malaise, weakness and decreased appetite.  Patient expressed her winded sensation with activity. Respiratory system: Positive rhonchi bilaterally (left more than right); no using accessory muscle.  At rest good saturation on room air appreciated. Cardiovascular system:RRR. No rubs, no gallops, no  JVD. Gastrointestinal system: Abdomen is nondistended, soft and nontender. No organomegaly or masses felt. Normal bowel sounds heard. Central nervous system: Alert and oriented.  No focal neurological deficits. Extremities: No Cyanosis, no clubbing. Skin: No petechiae; port-A cath in place. Psychiatry: Judgement and insight appear normal. Mood & affect appropriate.   Data Reviewed: Basic metabolic panel: Demonstrating sodium 137, potassium 4.6, BUN 22, creatinine 1.08 GFR more than 60 with an anion gap of 6. CBC: WBCs 2.6, hemoglobin 11.7 platelet count 149 K Prothrombin time 18.4 and INR 1.5  Family Communication: No family at bedside.  Disposition: Status is: Inpatient Remains inpatient appropriate because: Continue treatment with IV antibiotics, IV fluids and reassess oxygen desaturation screening.   Planned Discharge Destination: Home    Author: Barton Dubois, MD 01/20/2022 8:40 AM  For on call review [email or web address].

## 2022-01-20 NOTE — Plan of Care (Signed)

## 2022-01-20 NOTE — Progress Notes (Signed)
  Transition of Care [facility]) Screening Note   Patient Details  Name: DERICK SEMINARA Date of Birth: January 18, 1946   Transition of Care [facility]) CM/SW Contact:    Ihor Gully, LCSW Phone Number: 01/20/2022, 12:41 PM    Transition of Care Department [facility]) has reviewed patient and no TOC needs have been identified at this time. We will continue to monitor patient advancement through interdisciplinary progression rounds. If new patient transition needs arise, please place a TOC consult.

## 2022-01-20 NOTE — Progress Notes (Signed)
ANTICOAGULATION CONSULT NOTE - Initial Consult  Pharmacy Consult for warfarin Indication:  hx left ventricular apical thrombus  Allergies  Allergen Reactions   Ace Inhibitors Cough   Statins Other (See Comments)    Muscle pains    Patient Measurements: Height: '5\' 8"'$  (172.7 cm) Weight: 83.9 kg (184 lb 15.5 oz) IBW/kg (Calculated) : 68.4   Labs: Recent Labs    01/19/22 1108 01/20/22 0518  HGB 13.1 11.7*  HCT 40.5 35.9*  PLT 155 149*  LABPROT 18.7* 18.4*  INR 1.6* 1.5*  CREATININE 1.35* 1.08   Estimated Creatinine Clearance: 61.4 mL/min (by C-G formula based on SCr of 1.08 mg/dL).  Assessment: Pharmacy consulted to dose warfarin in 76 yo male with history of left ventricular apical thrombus.  [place] clinic note from 6/1 listed home dose as  2.5 mg on MWF and 5 mg ROW  although per med history report, takes '5mg'$  daily x friday. Admission INR 1.6 with last dose 6/11.  Potential drug-drug interaction with azithromycin and drug-disease interaction given CAP - will monitor closely  Goal of Therapy:  INR 2-3 Monitor platelets by anticoagulation protocol: Yes   Plan:  Warfarin 5 mg x 1 dose Monitor daily INR and s/s of bleeding.  Lorenso Courier, PharmD Clinical Pharmacist 01/20/2022 8:29 AM

## 2022-01-20 NOTE — Progress Notes (Addendum)
Initial Nutrition Assessment  DOCUMENTATION CODES:      INTERVENTION:  Ensure Enlive po BID   Regular diet   Daily weights  NUTRITION DIAGNOSIS:   Increased nutrient needs related to cancer and cancer related treatments as evidenced by estimated needs.   GOAL:  Patient will meet greater than or equal to 90% of their needs  MONITOR:  PO intake, Supplement acceptance, Labs, Weight trends  REASON FOR ASSESSMENT:   Malnutrition Screening Tool    ASSESSMENT: Patient is an overweight 76 yo male who presents from home with coughing and shortness of breath- pneumonia per chest x-ray.   PMH: B-cell lymphoma stage IV receiving chemotherapy, ischemic cardiomyopathy, HTN.   Patient is receiving a regular diet- and appetite comes and goes. Patient says some days he is able to eat well and others it's difficult to eat anything. He uses nutrition supplements to support intake on those days. He lives with wife who cooks for them and they also eat out. Complains taste changes even the steak he loves doesn't taste right.  Weights suggest severe loss of 8.2 kg x 11 days and 10 kg the past 6 weeks. Obtain daily weights. Patient is on diuretic therapy. Patient reports weight loss of ~40 lb since September 2022. Expect there are multiple factors involved in weight change.  Medications: lasix, antibiotics.  Labs:    Latest Ref Rng & Units 01/20/2022    5:18 AM 01/19/2022   11:08 AM 01/06/2022    7:59 AM  BMP  Glucose 70 - 99 mg/dL 110  150  145   BUN 8 - 23 mg/dL '22  26  30   '$ Creatinine 0.61 - 1.24 mg/dL 1.08  1.35  1.64   Sodium 135 - 145 mmol/L 137  136  137   Potassium 3.5 - 5.1 mmol/L 4.6  4.8  4.4   Chloride 98 - 111 mmol/L 103  101  103   CO2 22 - 32 mmol/L '28  28  26   '$ Calcium 8.9 - 10.3 mg/dL 8.6  8.8  9.3       NUTRITION - FOCUSED PHYSICAL EXAM: Nutrition-Focused physical exam completed. Findings are mild orbital, moderate tricep fat depletion, moderate deltoid muscle  depletion, and mild BLE edema.     Diet Order:   Diet Order             Diet regular Room service appropriate? Yes; Fluid consistency: Thin  Diet effective now                   EDUCATION NEEDS:  Education needs have been addressed  Skin:  Skin Assessment: Reviewed RN Assessment  Last BM:  6/12 type 6  Height:   Ht Readings from Last 1 Encounters:  01/19/22 '5\' 8"'$  (1.727 m)    Weight:   Wt Readings from Last 1 Encounters:  01/19/22 83.9 kg    Ideal Body Weight:   64 kg   BMI:  Body mass index is 28.12 kg/m.  Estimated Nutritional Needs:   Kcal:  2000-2200  Protein:  95-104 gr  Fluid:  2 liters daily  Colman Cater MS,RD,CSG,LDN Contact: Shea Evans

## 2022-01-20 NOTE — Evaluation (Signed)
Physical Therapy Evaluation Patient Details Name: [name] [name] MRN: [id number] DOB: 19-May-1946 Today's Date: 01/20/2022  History of Present Illness  [name] [name] is a 76 y.o. male with medical history significant for lymphoma on chemotherapy, hypertension, CABG, ischemic cardiomyopathy, OSA.  Patient presented to the ED with complaints of difficulty breathing and cough.  Symptoms started a week ago and have gradually worsened.  He reports his cough is now productive of thick black-yellowish mucus.  He denies fever or chills.  1 episode of vomiting, no loose stools.  No abdominal pain.  He reports urinary urgency over the past week, but denies pain with urination.   Clinical Impression  Patient presents supine bed and consents to PT evaluation. Patient is modified independent with bed mobility demonstrating slightly labored movement and extended time required. Patient is independent with transfers with appropriate strength and coordination. Vitals assessed due to subjective reporting of dizziness after transfers in which patient BP was 111/63 and was within therapeutic range. Patient is able to ambulate 80 feet in hallway with min guard assist and use of gait belt. Patient was able to ambulate with supervision originally but began feeling some dizziness during gait assessment in which min guard was needed. Moderate gait deviations observed. Minor balance deficits secondary to some dizziness but was primarily limited by fatigue and SOB. Vitals assessed after gait assessment with BP reading 107/60 and SpO2 averaging 98%. Patient tolerated sitting up in chair after therapy with nursing staff present. Patient will benefit from continued skilled physical therapy in hospital and recommended venue below to increase strength, balance, endurance for safe ADLs and gait.      Recommendations for follow up therapy are one component of a multi-disciplinary discharge planning process, led by the attending  physician.  Recommendations may be updated based on patient status, additional functional criteria and insurance authorization.  Follow Up Recommendations Home health PT    Assistance Recommended at Discharge PRN  Patient can return home with the following  A little help with walking and/or transfers;Help with stairs or ramp for entrance    Equipment Recommendations None recommended by PT  Recommendations for Other Services       Functional Status Assessment Patient has had a recent decline in their functional status and demonstrates the ability to make significant improvements in function in a reasonable and predictable amount of time.     Precautions / Restrictions Precautions Precautions: None Restrictions Weight Bearing Restrictions: No      Mobility  Bed Mobility Overal bed mobility: Modified Independent             General bed mobility comments: Modified independent with bed mobility with slightly labored movement and extended time required    Transfers Overall transfer level: Independent Equipment used: None               General transfer comment: Patient is independent with STS and bed to chair transfers with appropriate strength and coordination.    Ambulation/Gait Ambulation/Gait assistance: Min guard Gait Distance (Feet): 80 Feet Assistive device: None Gait Pattern/deviations: Step-to pattern, Decreased step length - left, Decreased step length - right, Decreased stride length, Decreased stance time - left, Decreased dorsiflexion - right, Decreased dorsiflexion - left Gait velocity: decreased     General Gait Details: Able to ambulate 80 feet in hallway with min guard assist and use of gait belt. Moderate gait deviations observed with decreased B ankle DF and B ER of LE's. Minor balance deficits secondary to some dizziness  but was primarily limited by fatigue and SOB. Vitals assessed  Stairs            Wheelchair Mobility    Modified  Rankin (Stroke Patients Only)       Balance Overall balance assessment: Mild deficits observed, not formally tested                                           Pertinent Vitals/Pain Pain Assessment Pain Assessment: No/denies pain    Home Living Family/patient expects to be discharged to:: Private residence Living Arrangements: Spouse/significant other Available Help at Discharge: Available 24 hours/day;Family Type of Home: Mobile home Home Access: Stairs to enter Entrance Stairs-Rails: Can reach both Entrance Stairs-Number of Steps: 3   Home Layout: One level Home Equipment: Conservation officer, nature (2 wheels);Cane - single point      Prior Function Prior Level of Function : Independent/Modified Independent             Mobility Comments: Patient reports being able to walk at home and in the community independently with no AD. Patient is currently driving. ADLs Comments: Patient reports being independent with ADL's and functional tasks.     Hand Dominance        Extremity/Trunk Assessment   Upper Extremity Assessment Upper Extremity Assessment: Overall WFL for tasks assessed    Lower Extremity Assessment Lower Extremity Assessment: Overall WFL for tasks assessed    Cervical / Trunk Assessment Cervical / Trunk Assessment: Normal  Communication   Communication: No difficulties  Cognition Arousal/Alertness: Awake/alert Behavior During Therapy: WFL for tasks assessed/performed Overall Cognitive Status: Within Functional Limits for tasks assessed                                          General Comments      Exercises     Assessment/Plan    PT Assessment Patient needs continued PT services;All further PT needs can be met in the next venue of care  PT Problem List Decreased strength;Decreased activity tolerance;Decreased balance;Decreased mobility;Decreased coordination;Decreased range of motion       PT Treatment Interventions  DME instruction;Gait training;Functional mobility training;Therapeutic activities;Therapeutic exercise;Balance training    PT Goals (Current goals can be found in the Care Plan section)  Acute Rehab PT Goals Patient Stated Goal: return home PT Goal Formulation: With patient Time For Goal Achievement: 01/27/22 Potential to Achieve Goals: Good    Frequency Min 2X/week     Co-evaluation               AM-PAC PT "6 Clicks" Mobility  Outcome Measure Help needed turning from your back to your side while in a flat bed without using bedrails?: None Help needed moving from lying on your back to sitting on the side of a flat bed without using bedrails?: None Help needed moving to and from a bed to a chair (including a wheelchair)?: None Help needed standing up from a chair using your arms (e.g., wheelchair or bedside chair)?: None Help needed to walk in hospital room?: A Little Help needed climbing 3-5 steps with a railing? : A Little 6 Click Score: 22    End of Session Equipment Utilized During Treatment: Gait belt Activity Tolerance: Patient tolerated treatment well;No increased pain;Patient limited by fatigue Patient left: in  chair;with call bell/phone within reach;with nursing/sitter in room Nurse Communication: Mobility status PT Visit Diagnosis: Unsteadiness on feet (R26.81);Other abnormalities of gait and mobility (R26.89);Muscle weakness (generalized) (M62.81)    Time: 2548-3234 PT Time Calculation (min) (ACUTE ONLY): 28 min   Charges:   PT Evaluation $PT Eval Moderate Complexity: 1 Mod PT Treatments $Therapeutic Activity: 23-37 mins        2:16 PM, 01/20/22 Lestine Box, S/PT

## 2022-01-20 NOTE — Progress Notes (Signed)
EPIC Encounter for ICM Monitoring  Patient Name: [name] [name] is a 76 y.o. male Date: 01/20/2022 Primary Care Physican: Biagio Borg, MD Primary Cardiologist: [name] Electrophysiologist: Allred 09/10/2021 Weight:   206 lbs 10/07/2021 Weight: 206 lbs (does not weight at home) 12/29/2021 Weight: 201 lbs                                                            Currently hospitalized with dx of possible pneumonia.      Optivol thoracic impedance normal but was suggesting possible fluid accumulation 5/29-6/5.    Fluid index > returned to normal 5/20.   Prescribed: Furosemide 20 mg take 1 tablet(s) by mouth daily   Jardiance 10 mg take 1 tablet before breakfast   Labs: 01/20/2022 Creatinine 1.08, BUN 22, Potassium 4.6, Sodium 137, GFR >60 01/19/2022 Creatinine 1.35, BUN 26, Potassium 4.8, Sodium 136, GFR 54  01/06/2022 Creatinine 1.64, BUN 30, Potassium 4.4, Sodium 137, GFR 43  12/22/2021 Creatinine 1.13, BUN 38, Potassium 4.0, Sodium 138, GFR >60  12/15/2021 Creatinine 1.37, BUN 29, Potassium 4.5, Sodium 138, GFR 53 12/02/2021 Creatinine 1.60, BUN 26, Potassium 4.7, Sodium 138, GFR 44  11/13/2021 Creatinine 1.79, BUN 33, Potassium 4.2, Sodium 137, GFR 39  10/24/2021 Creatinine 1.41, BUN 30, Potassium 4.9, Sodium 140, GFR 52 10/09/2021 Creatinine 1.45, BUN 27, Potassium 4.3, Sodium 137 A complete set of results can be found in Results Review.   Recommendations:  Hospitalized.   Follow-up plan: ICM clinic phone appointment on 01/27/2022 to recheck fluid levels post hospitalization.   91 day device clinic remote transmission 01/27/2022.     EP/Cardiology Office Visits: 03/03/2022 with Dr [name].  Recall 06/14/2022 with Oda Kilts, PA.   Copy of ICM check sent to Dr. Rayann Heman.  3 month ICM trend: 01/19/2022.    12-14 Month ICM trend:     Rosalene Billings, RN 01/20/2022 8:16 AM

## 2022-01-20 NOTE — Progress Notes (Signed)
PT recommends HHPT. Discussed with Mrs. Alroy Dust. She prefers OPPT. Order for OPPT placed.    Paraskevi Funez, Clydene Pugh, LCSW

## 2022-01-20 NOTE — Progress Notes (Signed)
[facility] notification completed: Your notification ID is: [id number]   Ihor Gully, LCSW

## 2022-01-21 DIAGNOSIS — J9601 Acute respiratory failure with hypoxia: Secondary | ICD-10-CM | POA: Diagnosis not present

## 2022-01-21 DIAGNOSIS — J189 Pneumonia, unspecified organism: Secondary | ICD-10-CM | POA: Diagnosis not present

## 2022-01-21 DIAGNOSIS — C8510 Unspecified B-cell lymphoma, unspecified site: Secondary | ICD-10-CM | POA: Diagnosis not present

## 2022-01-21 DIAGNOSIS — I10 Essential (primary) hypertension: Secondary | ICD-10-CM | POA: Diagnosis not present

## 2022-01-21 LAB — CBC
HCT: 36.4 % — ABNORMAL LOW (ref 39.0–52.0)
Hemoglobin: 12 g/dL — ABNORMAL LOW (ref 13.0–17.0)
MCH: 31.7 pg (ref 26.0–34.0)
MCHC: 33 g/dL (ref 30.0–36.0)
MCV: 96.3 fL (ref 80.0–100.0)
Platelets: 151 10*3/uL (ref 150–400)
RBC: 3.78 MIL/uL — ABNORMAL LOW (ref 4.22–5.81)
RDW: 13.8 % (ref 11.5–15.5)
WBC: 11.6 10*3/uL — ABNORMAL HIGH (ref 4.0–10.5)
nRBC: 0 % (ref 0.0–0.2)

## 2022-01-21 LAB — BASIC METABOLIC PANEL
Anion gap: 6 (ref 5–15)
BUN: 20 mg/dL (ref 8–23)
CO2: 27 mmol/L (ref 22–32)
Calcium: 8.6 mg/dL — ABNORMAL LOW (ref 8.9–10.3)
Chloride: 104 mmol/L (ref 98–111)
Creatinine, Ser: 1.11 mg/dL (ref 0.61–1.24)
GFR, Estimated: >60 mL/min (ref >60)
Glucose, Bld: 134 mg/dL — ABNORMAL HIGH (ref 70–99)
Potassium: 4.4 mmol/L (ref 3.5–5.1)
Sodium: 137 mmol/L (ref 135–145)

## 2022-01-21 LAB — PROTIME-INR
INR: 1.7 — ABNORMAL HIGH (ref 0.8–1.2)
Prothrombin Time: 20 seconds — ABNORMAL HIGH (ref 11.4–15.2)

## 2022-01-21 MED ORDER — ENSURE ENLIVE PO LIQD: 237.0000 mL | Freq: Three times a day (TID) | ORAL | 12 refills | Status: DC

## 2022-01-21 MED ORDER — CEFDINIR 300 MG PO CAPS: 300.0000 mg | ORAL_CAPSULE | Freq: Two times a day (BID) | ORAL | 0 refills | Status: DC

## 2022-01-21 MED ORDER — WARFARIN SODIUM 5 MG PO TABS: 5.0000 mg | ORAL_TABLET | Freq: Once | ORAL | Status: DC

## 2022-01-21 MED ORDER — CEFDINIR 300 MG PO CAPS
300.0000 mg | ORAL_CAPSULE | Freq: Two times a day (BID) | ORAL | Status: DC
  Administered 2022-01-21: 300 mg via ORAL
  Filled 2022-01-21: qty 1

## 2022-01-21 MED ORDER — HEPARIN SOD (PORK) LOCK FLUSH 100 UNIT/ML IV SOLN
500.0000 [IU] | Freq: Once | INTRAVENOUS | Status: AC
Start: 2022-01-21 — End: 2022-01-21
  Administered 2022-01-21: 500 [IU] via INTRAVENOUS
  Filled 2022-01-21: qty 5

## 2022-01-21 MED ORDER — VALSARTAN 40 MG PO TABS: 40.0000 mg | ORAL_TABLET | Freq: Every day | ORAL | 1 refills | Status: DC

## 2022-01-21 MED ORDER — AZITHROMYCIN 500 MG PO TABS: 500.0000 mg | ORAL_TABLET | Freq: Every day | ORAL | 0 refills | Status: DC

## 2022-01-21 MED ORDER — AZITHROMYCIN 250 MG PO TABS
500.0000 mg | ORAL_TABLET | Freq: Every day | ORAL | Status: DC
  Administered 2022-01-21: 500 mg via ORAL
  Filled 2022-01-21: qty 2

## 2022-01-21 NOTE — Progress Notes (Signed)
ANTICOAGULATION CONSULT NOTE -  Pharmacy Consult for warfarin Indication:  hx left ventricular apical thrombus  Allergies  Allergen Reactions   Ace Inhibitors Cough   Statins Other (See Comments)    Muscle pains    Patient Measurements: Height: '5\' 8"'$  (172.7 cm) Weight: 84.3 kg (185 lb 13.6 oz) IBW/kg (Calculated) : 68.4   Labs: Recent Labs    01/19/22 1108 01/20/22 0518 01/21/22 0505  HGB 13.1 11.7* 12.0*  HCT 40.5 35.9* 36.4*  PLT 155 149* 151  LABPROT 18.7* 18.4* 20.0*  INR 1.6* 1.5* 1.7*  CREATININE 1.35* 1.08 1.11   Estimated Creatinine Clearance: 59.9 mL/min (by C-G formula based on SCr of 1.11 mg/dL).  Assessment: Pharmacy consulted to dose warfarin in 76 yo male with history of left ventricular apical thrombus.  [place] clinic note from 6/1 listed home dose as  2.5 mg on MWF and 5 mg ROW  although per med history report, takes '5mg'$  daily x friday.   INR 1.5 > 1.7  Potential drug-drug interaction with azithromycin- monitor   Goal of Therapy:  INR 2-3 Monitor platelets by anticoagulation protocol: Yes   Plan:  Warfarin 5 mg x 1 dose Monitor daily INR and s/s of bleeding.  Margot Ables, PharmD Clinical Pharmacist 01/21/2022 8:17 AM

## 2022-01-21 NOTE — Hospital Course (Addendum)
As per H&P written by Dr. Denton Brick on 01/19/2022 [name] [name] is a 76 y.o. male with medical history significant for lymphoma on chemotherapy, hypertension, CABG, ischemic cardiomyopathy, OSA. Patient presented to the ED with complaints of difficulty breathing and cough.  Symptoms started a week ago and have gradually worsened.  He reports his cough is now productive of thick black-yellowish mucus.  He denies fever or chills.  1 episode of vomiting, no loose stools.  No abdominal pain.  He reports urinary urgency over the past week, but denies pain with urination.   ED Course: Temperature 98.7.  Heart rate 70s to 80s.  Respiratory rate 17-21.  Blood pressure systolic [phone].  O2 sats 93 to 97% on room air, with ambulation O2 sats dropped to 83%. WBC 12.3.  D-dimer elevated at 1.56.  CTA chest shows left upper lobe left lower lobe and right lower lobe scarring or interstitial pneumonia. IV ceftriaxone and azithromycin started. The patient improved clinically.  The patient improved promptly.  He was weaned off of oxygen.  His shortness of breath improved.  WBC improved.  He remained afebrile hemodynamically stable.  He was discharged home with 5 additional days of cefdinir and azithromycin.  -ambulatory pulse ox on day of d/c did not show any oxygen desaturation <90%

## 2022-01-21 NOTE — Progress Notes (Signed)
Ng Discharge Note  Admit Date:  01/19/2022 Discharge date: 01/21/2022   [name] to be D/C'd Home per MD order.  AVS completed. Patient/caregiver able to verbalize understanding.  Discharge Medication: Allergies as of 01/21/2022       Reactions   Ace Inhibitors Cough   Statins Other (See Comments)   Muscle pains        Medication List     STOP taking these medications    empagliflozin 10 MG Tabs tablet Commonly known as: Jardiance   pantoprazole 40 MG tablet Commonly known as: Protonix   predniSONE 20 MG tablet Commonly known as: DELTASONE       TAKE these medications    allopurinol 300 MG tablet Commonly known as: ZYLOPRIM Take 1 tablet (300 mg total) by mouth daily.   ARTIFICIAL TEAR SOLUTION OP Place 1 drop into both eyes daily as needed (dry eyes).   azithromycin 500 MG tablet Commonly known as: ZITHROMAX Take 1 tablet (500 mg total) by mouth daily.   b complex vitamins capsule Take 1 capsule by mouth daily.   cefdinir 300 MG capsule Commonly known as: OMNICEF Take 1 capsule (300 mg total) by mouth every 12 (twelve) hours.   ezetimibe 10 MG tablet Commonly known as: ZETIA TAKE 1 TABLET EVERY DAY What changed:  how much to take when to take this   feeding supplement Liqd Take 237 mLs by mouth 3 (three) times daily between meals.   furosemide 20 MG tablet Commonly known as: LASIX Take 1 tablet (20 mg total) by mouth daily.   lidocaine-prilocaine cream Commonly known as: EMLA Apply a small amount to port a cath site (do not rub in) and cover with plastic wrap 1 hour prior to infusion appointments   metoprolol succinate 50 MG 24 hr tablet Commonly known as: TOPROL-XL TAKE 1 TABLET DAILY. TAKE WITH OR IMMEDIATELY FOLLOWING A MEAL.   multivitamin with minerals tablet Take 1 tablet by mouth daily. Centrum silver   prochlorperazine 10 MG tablet Commonly known as: COMPAZINE Take 1 tablet (10 mg total) by mouth every 6 (six) hours as  needed for nausea or vomiting.   rosuvastatin 5 MG tablet Commonly known as: CRESTOR TAKE 1 TABLET EVERY OTHER DAY   spironolactone 25 MG tablet Commonly known as: ALDACTONE Take 0.5 tablets (12.5 mg total) by mouth daily. What changed: how much to take   triamcinolone 55 MCG/ACT Aero nasal inhaler Commonly known as: NASACORT Place 2 sprays into the nose daily. What changed:  when to take this reasons to take this   valsartan 40 MG tablet Commonly known as: DIOVAN Take 1 tablet (40 mg total) by mouth daily. What changed:  medication strength how much to take   VITA-C PO Take 1,000 mg by mouth daily.   VITAMIN D PO Take 1,000 Units by mouth daily.   warfarin 5 MG tablet Commonly known as: COUMADIN Take as directed. If you are unsure how to take this medication, talk to your nurse or doctor. Original instructions: TAKE 1 TABLET EVERY DAY AT 4 PM What changed: See the new instructions.        Discharge Assessment: Vitals:   01/21/22 0811 01/21/22 1224  BP:  111/78  Pulse:  73  Resp:  20  Temp:    SpO2: 93% 97%   Skin clean, dry and intact without evidence of skin break down, no evidence of skin tears noted. IV catheter discontinued intact. Site without signs and symptoms of complications - no  redness or edema noted at insertion site, patient denies c/o pain - only slight tenderness at site.  Dressing with slight pressure applied.  D/c Instructions-Education: Discharge instructions given to patient/family with verbalized understanding. D/c education completed with patient/family including follow up instructions, medication list, d/c activities limitations if indicated, with other d/c instructions as indicated by MD - patient able to verbalize understanding, all questions fully answered. Patient instructed to return to ED, call 911, or call MD for any changes in condition.  Patient escorted via [place], and D/C home via private auto.  Tsosie Billing,  LPN 09/23/863 7:84 PM

## 2022-01-21 NOTE — Discharge Summary (Addendum)
Physician Discharge Summary   Patient: [name] [name] MRN: [id number] DOB: 03-20-1946  Admit date:     01/19/2022  Discharge date: 01/21/22  Discharge Physician: Shanon Brow Derec Mozingo   PCP: Biagio Borg, MD   Recommendations at discharge:   Please follow up with primary care provider within 1-2 weeks  Please repeat BMP and CBC in one week   Hospital Course: As per H&P written by Dr. Denton Brick on 01/19/2022 [name] [name] is a 76 y.o. male with medical history significant for lymphoma on chemotherapy, hypertension, CABG, ischemic cardiomyopathy, OSA. Patient presented to the ED with complaints of difficulty breathing and cough.  Symptoms started a week ago and have gradually worsened.  He reports his cough is now productive of thick black-yellowish mucus.  He denies fever or chills.  1 episode of vomiting, no loose stools.  No abdominal pain.  He reports urinary urgency over the past week, but denies pain with urination.   ED Course: Temperature 98.7.  Heart rate 70s to 80s.  Respiratory rate 17-21.  Blood pressure systolic 671 245.  O2 sats 93 to 97% on room air, with ambulation O2 sats dropped to 83%. WBC 12.3.  D-dimer elevated at 1.56.  CTA chest shows left upper lobe left lower lobe and right lower lobe scarring or interstitial pneumonia. IV ceftriaxone and azithromycin started. The patient improved clinically.  The patient improved promptly.  He was weaned off of oxygen.  His shortness of breath improved.  WBC improved.  He remained afebrile hemodynamically stable.  He was discharged home with 5 additional days of cefdinir and azithromycin.  -ambulatory pulse ox on day of d/c did not show any oxygen desaturation <90%  Assessment and Plan: * PNA (pneumonia) -With productive cough and difficulty breathing, hypoxic with exertion at time of admission -CT angiogram demonstrating interstitial left lung pneumonia; negative PE -Continue bronchodilators -Started flutter valve -Continue the use of  Pulmicort and supportive care -Continue Rocephin and Zithromax as antibiotic protocol for pneumonia.   -Discharge home with 5 additional days of cefdinir and azithromycin to complete 1 week  Acute respiratory failure with hypoxia ([place]) -Likely secondary to pneumonia.   -Continue treatment for pneumonia -ambulatory pulse ox on day of d/c did not show any oxygen desaturation <90%  High grade B-cell lymphoma (HCC) -Stage IVb high-grade B-cell lymphoma, follows with Dr. Delton Coombes, currently on chemotherapy last chemo 01/06/22. -Continue outpatient follow-up with oncology service.  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 -Pharmacy adjusting warfarin dose -Continue to follow prothrombin time and INR. -INR goal 2-3.  Ischemic cardiomyopathy -Stable and compensated.   -Continue to be judicious with fluid resuscitation -Restart Lasix after initially holding secondary to contrast exposure -Recent echo in January 12, 2022 demonstrating ejection fraction of 30-35%  -Daily weights, strict I's and O's and continue use of metoprolol will be provided.   -Restart spironolactone and valsartan at lower dose  Essential hypertension -Soft initiall -initially held valsartan>> restart at lower dose, 40 mg daily at the time of discharge -resume the use of Lasix and spironolactone in the next 24 hours (as long as his vital signs will be able to tolerate it). -Continue metoprolol.          Consultants: none Procedures performed: none  Disposition: Home Diet recommendation:  Cardiac diet DISCHARGE MEDICATION: Allergies as of 01/21/2022       Reactions   Ace Inhibitors Cough   Statins Other (See Comments)   Muscle pains  Medication List     STOP taking these medications    empagliflozin 10 MG Tabs tablet Commonly known as: Jardiance   pantoprazole 40 MG tablet Commonly known as: Protonix   predniSONE 20 MG tablet Commonly known as: DELTASONE        TAKE these medications    allopurinol 300 MG tablet Commonly known as: ZYLOPRIM Take 1 tablet (300 mg total) by mouth daily.   ARTIFICIAL TEAR SOLUTION OP Place 1 drop into both eyes daily as needed (dry eyes).   azithromycin 500 MG tablet Commonly known as: ZITHROMAX Take 1 tablet (500 mg total) by mouth daily.   b complex vitamins capsule Take 1 capsule by mouth daily.   cefdinir 300 MG capsule Commonly known as: OMNICEF Take 1 capsule (300 mg total) by mouth every 12 (twelve) hours.   ezetimibe 10 MG tablet Commonly known as: ZETIA TAKE 1 TABLET EVERY DAY What changed:  how much to take when to take this   feeding supplement Liqd Take 237 mLs by mouth 3 (three) times daily between meals.   furosemide 20 MG tablet Commonly known as: LASIX Take 1 tablet (20 mg total) by mouth daily.   lidocaine-prilocaine cream Commonly known as: EMLA Apply a small amount to port a cath site (do not rub in) and cover with plastic wrap 1 hour prior to infusion appointments   metoprolol succinate 50 MG 24 hr tablet Commonly known as: TOPROL-XL TAKE 1 TABLET DAILY. TAKE WITH OR IMMEDIATELY FOLLOWING A MEAL.   multivitamin with minerals tablet Take 1 tablet by mouth daily. Centrum silver   prochlorperazine 10 MG tablet Commonly known as: COMPAZINE Take 1 tablet (10 mg total) by mouth every 6 (six) hours as needed for nausea or vomiting.   rosuvastatin 5 MG tablet Commonly known as: CRESTOR TAKE 1 TABLET EVERY OTHER DAY   spironolactone 25 MG tablet Commonly known as: ALDACTONE Take 0.5 tablets (12.5 mg total) by mouth daily. What changed: how much to take   triamcinolone 55 MCG/ACT Aero nasal inhaler Commonly known as: NASACORT Place 2 sprays into the nose daily. What changed:  when to take this reasons to take this   valsartan 40 MG tablet Commonly known as: DIOVAN Take 1 tablet (40 mg total) by mouth daily. What changed:  medication strength how much to  take   VITA-C PO Take 1,000 mg by mouth daily.   VITAMIN D PO Take 1,000 Units by mouth daily.   warfarin 5 MG tablet Commonly known as: COUMADIN Take as directed. If you are unsure how to take this medication, talk to your nurse or doctor. Original instructions: TAKE 1 TABLET EVERY DAY AT 4 PM What changed: See the new instructions.        Discharge Exam: Filed Weights   01/19/22 1059 01/19/22 1802 01/21/22 0500  Weight: 92.1 kg 83.9 kg 84.3 kg   HEENT:  /AT, No thrush, no icterus CV:  RRR, no rub, no S3, no S4 Lung: Bibasilar rales, L>R Abd:  soft/+BS, NT Ext: Trace LE edema, no lymphangitis, no synovitis, no rash   Condition at discharge: stable  The results of significant diagnostics from this hospitalization (including imaging, microbiology, ancillary and laboratory) are listed below for reference.   Imaging Studies: CT Angio Chest PE W and/or Wo Contrast  Result Date: 01/19/2022 CLINICAL DATA:  Cough, shortness of breath EXAM: CT ANGIOGRAPHY CHEST WITH CONTRAST TECHNIQUE: Multidetector CT imaging of the chest was performed using the standard protocol during bolus administration  of intravenous contrast. Multiplanar CT image reconstructions and MIPs were obtained to evaluate the vascular anatomy. RADIATION DOSE REDUCTION: This exam was performed according to the departmental dose-optimization program which includes automated exposure control, adjustment of the mA and/or kV according to patient size and/or use of iterative reconstruction technique. CONTRAST:  17m OMNIPAQUE IOHEXOL 350 MG/ML SOLN COMPARISON:  Previous studies including chest radiographs done earlier today FINDINGS: Cardiovascular: There is homogeneous enhancement in thoracic aorta. There is ectasia of ascending thoracic aorta measuring 3.8 cm. Coronary artery calcifications are seen. There are no intraluminal filling defects in the pulmonary artery branches. Mediastinum/Nodes: No significant lymphadenopathy  seen. Lungs/Pleura: Increased interstitial markings are seen in the left upper lobe with tree-in-bud appearance. Small patchy infiltrate is seen in the posterior right lower lung fields. Small patchy infiltrate is noted in the right posterior costophrenic angle. Small patchy infiltrates are seen in the left lower lobe. There is no focal pulmonary consolidation. There is no pleural effusion or pneumothorax. Upper Abdomen: Surgical clips are seen in gallbladder fossa. Musculoskeletal: Unremarkable. Review of the MIP images confirms the above findings. IMPRESSION: There is no evidence of pulmonary artery embolism. Coronary artery calcifications are seen. There is ectasia of ascending thoracic aorta measuring 3.8 cm. There is no evidence of thoracic aortic dissection. There is no focal pulmonary consolidation. Small scattered foci of increased interstitial markings are seen in the left upper lobe and both lower lobes. This finding may suggest scarring or interstitial pneumonia. Electronically Signed   By: PElmer PickerM.D.   On: 01/19/2022 13:25   DG Chest Port 1 View  Result Date: 01/19/2022 CLINICAL DATA:  Chest pain and shortness of breath.  Cough. EXAM: PORTABLE CHEST 1 VIEW COMPARISON:  10/09/2021 FINDINGS: The heart size and mediastinal contours are within normal limits. Pacemaker remains in stable position. New right-sided power port is seen with tip overlying the distal SVC. No evidence of pneumothorax. Both lungs are clear. The visualized skeletal structures are unremarkable. IMPRESSION: New right-sided power port in appropriate position. No active cardiopulmonary disease. Electronically Signed   By: JMarlaine HindM.D.   On: 01/19/2022 11:33   VAS [place]OWER EXTREMITY ARTERIAL DUPLEX  Result Date: 01/12/2022 LOWER EXTREMITY ARTERIAL DUPLEX STUDY Patient Name:  WIRIE FIORELLO Date of Exam:   01/12/2022 Medical Rec #: [id number]          Accession #:    [id number] of Birth: [birth year]           Patient Gender: M Patient Age:   89years Exam Location:  [facility] Procedure:      VAS [place] EXTREMITY ARTERIAL DUPLEX Referring Phys: MBarrington Ellison--------------------------------------------------------------------------------  Indications: Patient has had right lateral calf pain since 06/2021. He also has              noticed while in a seated position there is pain behind the right              knee. He denies claudication and rest pain symptoms but has leg              weakness and low stamina. Patient being treated for stomach              cancer/lymphoma with chemotherapy. High Risk Factors: Hypertension, hyperlipidemia, past history of smoking,                    coronary artery disease. Other Factors: SEE ABI REPORT  History of CABG and defibrillator.  Current ABI: Right 1.3 Left 1.3 Comparison Study: None Performing Technologist: Alecia Mackin RVT, RDCS (AE), RDMS  Examination Guidelines: A complete evaluation includes B-mode imaging, spectral Doppler, color Doppler, and power Doppler as needed of all accessible portions of each vessel. Bilateral testing is considered an integral part of a complete examination. Limited examinations for reoccurring indications may be performed as noted.  +-----------+--------+-----+--------+-----------+------------------------------+ RIGHT      PSV cm/sRatioStenosisWaveform   Comments                       +-----------+--------+-----+--------+-----------+------------------------------+ CFA Prox   97                   triphasic                                 +-----------+--------+-----+--------+-----------+------------------------------+ DFA        82                   triphasic                                 +-----------+--------+-----+--------+-----------+------------------------------+ SFA Prox   107                  triphasic                                  +-----------+--------+-----+--------+-----------+------------------------------+ SFA Mid    136                  triphasic                                 +-----------+--------+-----+--------+-----------+------------------------------+ SFA Distal 108                  triphasic  flow is disturbed              +-----------+--------+-----+--------+-----------+------------------------------+ POP Mid                                    aneurysm 2.3 x 2.7 x 3.1 cm    +-----------+--------+-----+--------+-----------+------------------------------+ POP Distal 39                   triphasic  aneurysm 3.7 x 3.9 x 4.4 cm,                                              thrombus within. Length of                                                aneurysm is approxiamtely 9.3                                             cm.                            +-----------+--------+-----+--------+-----------+------------------------------+  TP Trunk   77                                                             +-----------+--------+-----+--------+-----------+------------------------------+ ATA Prox   64                   triphasic  flow is disturbed              +-----------+--------+-----+--------+-----------+------------------------------+ ATA Distal 79                   multiphasic                               +-----------+--------+-----+--------+-----------+------------------------------+ PTA Prox   36                   triphasic  flow is disturbed              +-----------+--------+-----+--------+-----------+------------------------------+ PTA Distal 56                   multiphasic                               +-----------+--------+-----+--------+-----------+------------------------------+ PERO Prox  95                   triphasic  flow is disturbed              +-----------+--------+-----+--------+-----------+------------------------------+ PERO  Distal102                  triphasic                                 +-----------+--------+-----+--------+-----------+------------------------------+  +-----------+--------+-----+--------+---------+--------+ LEFT       PSV cm/sRatioStenosisWaveform Comments +-----------+--------+-----+--------+---------+--------+ CFA Prox   81                   triphasic         +-----------+--------+-----+--------+---------+--------+ DFA        54                   triphasic         +-----------+--------+-----+--------+---------+--------+ SFA Prox   65                   triphasic         +-----------+--------+-----+--------+---------+--------+ SFA Mid    80                   triphasic         +-----------+--------+-----+--------+---------+--------+ SFA Distal 76                   triphasic         +-----------+--------+-----+--------+---------+--------+ POP Prox   78                   triphasic         +-----------+--------+-----+--------+---------+--------+ POP Distal 54                   triphasic         +-----------+--------+-----+--------+---------+--------+ TP Trunk  37                   triphasic         +-----------+--------+-----+--------+---------+--------+ ATA Prox   53                   biphasic          +-----------+--------+-----+--------+---------+--------+ ATA Distal 116                  triphasic         +-----------+--------+-----+--------+---------+--------+ PTA Prox   71                   triphasic         +-----------+--------+-----+--------+---------+--------+ PTA Distal 102                  triphasic         +-----------+--------+-----+--------+---------+--------+ PERO Prox  65                   triphasic         +-----------+--------+-----+--------+---------+--------+ PERO Distal59                   triphasic         +-----------+--------+-----+--------+---------+--------+  Findings reported to  Barrington Ellison, PA-C through [facility] email and Dr. Oval Linsey (DOD) at 3:15 pm .  Summary: Right: No evidence of stenosis, athereosclerosis noted throughout. Large distal popiteal arterial aneurysm. See above. Left: No evidence of stenosis seen, atherolsclerosis noted throughout.  See table(s) above for measurements and observations. Vascular consult recommended. Electronically signed by Jenkins Rouge MD on 01/12/2022 at 8:37:45 PM.    Final    VAS [place] LOWER EXT ART SEG MULTI (SEGMENTALS & LE RAYNAUDS)  Result Date: 01/12/2022  LOWER EXTREMITY DOPPLER STUDY Patient Name:  GIBRIL MASTRO  Date of Exam:   01/12/2022 Medical Rec #: [id number]           Accession #:    [id number] Date of Birth: [birth year]           Patient Gender: M Patient Age:   63 years Exam Location:  [facility] Procedure:      VAS [place] LOWER EXT ART SEG MULTI ([place]) Referring Phys: Shirley Friar --------------------------------------------------------------------------------  Indications: Patient has had right lateral calf pain since 06/2021. He also has              noticed while in a seated position there is pain behind the right              knee. He denies claudication and rest pain symptoms but has leg              weakness and low stamina. Patient being treated for stomach              cancer/lymphoma with chemotherapy. High Risk Factors: Hypertension, hyperlipidemia, past history of smoking,                    coronary artery disease. Other Factors: History of CABG and defibrillator.                 SEE BILATERAL LEG ARTERIAL DUPLEX REPORT.  Comparison Study: None Performing Technologist: Salvadore Dom RVT  Examination Guidelines: A complete evaluation includes at minimum, Doppler waveform signals and systolic blood pressure reading at the level of bilateral brachial, anterior tibial, and posterior tibial arteries, when vessel segments are accessible. Bilateral  testing is considered an integral part of a complete  examination. Photoelectric Plethysmograph (PPG) waveforms and toe systolic pressure readings are included as required and additional duplex testing as needed. Limited examinations for reoccurring indications may be performed as noted.  ABI Findings: +---------+------------------+-----+---------+---------------------------------+ Right    Rt Pressure (mmHg)IndexWaveform Comment                           +---------+------------------+-----+---------+---------------------------------+ Brachial 105                                                               +---------+------------------+-----+---------+---------------------------------+ CFA                             triphasic                                  +---------+------------------+-----+---------+---------------------------------+ Popliteal                       triphasicdisturbed flow with                                                        bruit/pulsatile mass felt         +---------+------------------+-----+---------+---------------------------------+ PTA      144               1.30 triphasic                                  +---------+------------------+-----+---------+---------------------------------+ PERO     142               1.28 triphasic                                  +---------+------------------+-----+---------+---------------------------------+ DP       133               1.20 triphasic                                  +---------+------------------+-----+---------+---------------------------------+ Great Toe59                0.53 Abnormal                                   +---------+------------------+-----+---------+---------------------------------+ +---------+------------------+-----+---------+-------+ Left     Lt Pressure (mmHg)IndexWaveform Comment +---------+------------------+-----+---------+-------+ Brachial 111                    triphasic         +---------+------------------+-----+---------+-------+ CFA                             triphasic        +---------+------------------+-----+---------+-------+  Popliteal                       triphasic        +---------+------------------+-----+---------+-------+ PTA      144               1.30 triphasic        +---------+------------------+-----+---------+-------+ PERO     129               1.23 triphasic        +---------+------------------+-----+---------+-------+ DP       120               1.08 triphasic        +---------+------------------+-----+---------+-------+ Gardiner Rhyme               1.06 Normal           +---------+------------------+-----+---------+-------+ +-------+-----------+-----------+------------+------------+ ABI/TBIToday's ABIToday's TBIPrevious ABIPrevious TBI +-------+-----------+-----------+------------+------------+ Right  1.3        .53                                 +-------+-----------+-----------+------------+------------+ Left   1.3        1.06                                +-------+-----------+-----------+------------+------------+   Summary: Right: Resting right ankle-brachial index is within normal range. No evidence of significant right lower extremity arterial disease. The right toe-brachial index is abnormal. Left: Resting left ankle-brachial index is within normal range. No evidence of significant left lower extremity arterial disease. The left toe-brachial index is normal. *See table(s) above for measurements and observations.  Electronically signed by Jenkins Rouge MD on 01/12/2022 at 6:38:18 PM.    Final    ECHOCARDIOGRAM COMPLETE  Result Date: 01/12/2022    ECHOCARDIOGRAM REPORT   Patient Name:   ZAKRY CASO Date of Exam: 01/12/2022 Medical Rec #:  [id number]          Height:       69.0 in Accession #:    [id number]         Weight:       203.0 lb Date of Birth:  [birth year]          BSA:          2.079 m Patient Age:     7 years           BP:           105/70 mmHg Patient Gender: M                  HR:           60 bpm. Exam Location:  Outpatient Procedure: 2D Echo, Cardiac Doppler, Color Doppler and Intracardiac            Opacification Agent Indications:    Z51.11 Encounter for antineoplastic chemotheraphy; R06.9 DOE;                 R60.0 Lower extremity edema  History:        Patient has prior history of Echocardiogram examinations, most                 recent 12/27/2020. Cardiomyopathy, CAD, Prior CABG and                 Defibrillator,  Signs/Symptoms:Dyspnea and Edema; Risk                 Factors:Hypertension, Dyslipidemia and Former Smoker. Patient                 denies chest pain. He does have DOE and bilateral leg edema.                 Patient undergoing chemotherapy for lymphoma/stomach cancer.  Sonographer:    Salvadore Dom RVT, RDCS (AE), RDMS Referring Phys: [id number] [facility]  Sonographer Comments: Suboptimal apical window, suboptimal parasternal window and patient is morbidly obese. Image acquisition challenging due to patient body habitus. IMPRESSIONS  1. Left ventricular ejection fraction, by estimation, is 30 to 35%. The left ventricle has moderately decreased function. The left ventricle demonstrates regional wall motion abnormalities (see scoring diagram/findings for description). Left ventricular  diastolic parameters are indeterminate. There is akinesis of the left ventricular, mid-apical anteroseptal wall, inferoseptal wall and apical segment.  2. Right ventricular systolic function is normal. The right ventricular size is normal.  3. Left atrial size was moderately dilated.  4. The mitral valve is grossly normal. Trivial mitral valve regurgitation. No evidence of mitral stenosis.  5. The aortic valve is tricuspid. There is moderate calcification of the aortic valve. Aortic valve regurgitation is mild. Mild aortic valve stenosis.  6. The inferior vena cava is normal in size with <50%  respiratory variability, suggesting right atrial pressure of 8 mmHg. Comparison(s): No significant change from prior study. Conclusion(s)/Recommendation(s): No left ventricular mural or apical thrombus/thrombi. Cardiomyopathy unchanged from prior, with wall motion abnormalities as noted. FINDINGS  Left Ventricle: Left ventricular ejection fraction, by estimation, is 30 to 35%. The left ventricle has moderately decreased function. The left ventricle demonstrates regional wall motion abnormalities. Definity contrast agent was given IV to delineate the left ventricular endocardial borders. The left ventricular internal cavity size was normal in size. There is borderline left ventricular hypertrophy. Left ventricular diastolic parameters are indeterminate. Right Ventricle: The right ventricular size is normal. Right vetricular wall thickness was not well visualized. Right ventricular systolic function is normal. Left Atrium: Left atrial size was moderately dilated. Right Atrium: Right atrial size was normal in size. Pericardium: There is no evidence of pericardial effusion. Mitral Valve: The mitral valve is grossly normal. Trivial mitral valve regurgitation. No evidence of mitral valve stenosis. Tricuspid Valve: The tricuspid valve is not well visualized. Tricuspid valve regurgitation is trivial. No evidence of tricuspid stenosis. Aortic Valve: The aortic valve is tricuspid. There is moderate calcification of the aortic valve. Aortic valve regurgitation is mild. Aortic regurgitation PHT measures 535 msec. Mild aortic stenosis is present. Aortic valve mean gradient measures 7.5 mmHg. Aortic valve peak gradient measures 13.8 mmHg. Aortic valve area, by VTI measures 1.53 cm. Pulmonic Valve: The pulmonic valve was not well visualized. Pulmonic valve regurgitation is trivial. Aorta: The aortic root, ascending aorta, aortic arch and descending aorta are all structurally normal, with no evidence of dilitation or obstruction.  Venous: The inferior vena cava is normal in size with less than 50% respiratory variability, suggesting right atrial pressure of 8 mmHg. IAS/Shunts: The interatrial septum was not well visualized.  LEFT VENTRICLE PLAX 2D LVIDd:         5.25 cm   Diastology LVIDs:         3.85 cm   LV e' medial:    5.77 cm/s LV PW:         1.14 cm  LV E/e' medial:  10.3 LV IVS:        0.96 cm   LV e' lateral:   7.83 cm/s LVOT diam:     2.10 cm   LV E/e' lateral: 7.6 LV SV:         61 LV SV Index:   29 LVOT Area:     3.46 cm  RIGHT VENTRICLE RV S prime:     12.30 cm/s TAPSE (M-mode): 1.5 cm LEFT ATRIUM              Index        RIGHT ATRIUM           Index LA diam:        5.00 cm  2.40 cm/m   RA Area:     18.50 cm LA Vol (A2C):   159.0 ml 76.47 ml/m  RA Volume:   55.70 ml  26.79 ml/m LA Vol (A4C):   72.8 ml  35.01 ml/m LA Biplane Vol: 110.0 ml 52.91 ml/m  AORTIC VALVE                     PULMONIC VALVE AV Area (Vmax):    1.88 cm      PV Vmax:          1.26 m/s AV Area (Vmean):   1.59 cm      PV Peak grad:     6.4 mmHg AV Area (VTI):     1.53 cm      PR End Diast Vel: 2.56 msec AV Vmax:           186.00 cm/s AV Vmean:          129.500 cm/s AV VTI:            0.402 m AV Peak Grad:      13.8 mmHg AV Mean Grad:      7.5 mmHg LVOT Vmax:         101.00 cm/s LVOT Vmean:        59.600 cm/s LVOT VTI:          0.177 m LVOT/AV VTI ratio: 0.44 AI PHT:            535 msec AR Vena Contracta: 0.29 cm  AORTA Ao Root diam: 3.20 cm Ao Asc diam:  3.60 cm Ao Arch diam: 3.2 cm MITRAL VALVE MV Area (PHT): 2.91 cm    SHUNTS MV Decel Time: 261 msec    Systemic VTI:  0.18 m MV E velocity: 59.50 cm/s  Systemic Diam: 2.10 cm MV A velocity: 79.50 cm/s MV E/A ratio:  0.75 Buford Dresser MD Electronically signed by Buford Dresser MD Signature Date/Time: 01/12/2022/4:37:03 PM    Final     Microbiology: Results for orders placed or performed during the hospital encounter of 01/19/22  Resp Panel by RT-PCR (Flu A&B, Covid) Anterior Nasal  Swab     Status: None   Collection Time: 01/19/22 11:08 AM   Specimen: Anterior Nasal Swab  Result Value Ref Range Status   SARS Coronavirus 2 by RT PCR NEGATIVE NEGATIVE Final    Comment: (NOTE) SARS-CoV-2 target nucleic acids are NOT DETECTED.  The SARS-CoV-2 RNA is generally detectable in upper respiratory specimens during the acute phase of infection. The lowest concentration of SARS-CoV-2 viral copies this assay can detect is 138 copies/mL. A negative result does not preclude SARS-Cov-2 infection and should not be used as the sole basis for treatment or other patient management  decisions. A negative result may occur with  improper specimen collection/handling, submission of specimen other than nasopharyngeal swab, presence of viral mutation(s) within the areas targeted by this assay, and inadequate number of viral copies(<138 copies/mL). A negative result must be combined with clinical observations, patient history, and epidemiological information. The expected result is Negative.  Fact Sheet for Patients:  EntrepreneurPulse.com.au  Fact Sheet for Healthcare Providers:  IncredibleEmployment.be  This test is no t yet approved or cleared by the [place] FDA and  has been authorized for detection and/or diagnosis of SARS-CoV-2 by FDA under an Emergency Use Authorization (EUA). This EUA will remain  in effect (meaning this test can be used) for the duration of the COVID-19 declaration under Section 564(b)(1) of the Act, 21 U.S.C.section 360bbb-3(b)(1), unless the authorization is terminated  or revoked sooner.       Influenza A by PCR NEGATIVE NEGATIVE Final   Influenza B by PCR NEGATIVE NEGATIVE Final    Comment: (NOTE) The Xpert Xpress SARS-CoV-2/FLU/RSV plus assay is intended as an aid in the diagnosis of influenza from Nasopharyngeal swab specimens and should not be used as a sole basis for treatment. Nasal washings and aspirates  are unacceptable for Xpert Xpress SARS-CoV-2/FLU/RSV testing.  Fact Sheet for Patients: EntrepreneurPulse.com.au  Fact Sheet for Healthcare Providers: IncredibleEmployment.be  This test is not yet approved or cleared by the [place] FDA and has been authorized for detection and/or diagnosis of SARS-CoV-2 by FDA under an Emergency Use Authorization (EUA). This EUA will remain in effect (meaning this test can be used) for the duration of the COVID-19 declaration under Section 564(b)(1) of the Act, 21 U.S.C. section 360bbb-3(b)(1), unless the authorization is terminated or revoked.  Performed at [facility], [address], [place], [place] [address]     Labs: CBC: Recent Labs  Lab 01/19/22 1108 01/20/22 0518 01/21/22 0505  WBC 12.3* 12.6* 11.6*  HGB 13.1 11.7* 12.0*  HCT 40.5 35.9* 36.4*  MCV 95.7 95.5 96.3  PLT 155 149* 962   Basic Metabolic Panel: Recent Labs  Lab 01/19/22 1108 01/20/22 0518 01/21/22 0505  NA 136 137 137  K 4.8 4.6 4.4  CL 101 103 104  CO2 '28 28 27  '$ GLUCOSE 150* 110* 134*  BUN 26* 22 20  CREATININE 1.35* 1.08 1.11  CALCIUM 8.8* 8.6* 8.6*  MG 2.3  --   --    Liver Function Tests: Recent Labs  Lab 01/19/22 1108  AST 44*  ALT 67*  ALKPHOS 123  BILITOT 1.1  PROT 6.9  ALBUMIN 3.1*   CBG: No results for input(s): "GLUCAP" in the last 168 hours.  Discharge time spent: greater than 30 minutes.  Signed: Orson Eva, MD [facility] 01/21/2022

## 2022-01-26 ENCOUNTER — Telehealth (HOSPITAL_COMMUNITY): Payer: Self-pay | Admitting: Dietician

## 2022-01-26 NOTE — Telephone Encounter (Signed)
Nutrition Assessment   Reason for Assessment: MST    ASSESSMENT: 76 year old male with high-grade B-cell lymphoma. He is currently receiving R-CEOP q21d (started 5/8). Patient is under the care of Dr. Delton Coombes.   Past medical history includes HTN, CABG, ischemic cardiomyopathy, OSA, hypothyroidism, vit D deficiency -[facility] admit 6/12-6/14 with pneumonia   Spoke with patient via telephone. Introduced self and services available with [facility]. Patient appreciative of call and agreeable to telephone visit. Patient reports doing "fair" since hospital discharge. His appetite is poor, eating about half of what he normally would. Some foods don't "taste right." Patient ate bowl of oatmeal for breakfast, had steak for dinner. This tasted good to him. Patient reports drinking one Ensure Plus. Patient denies diarrhea, constipation, vomiting, nausea.    Nutrition Focused Physical Exam: unable to complete at this time.    Medications: zyloprim, vit C, B-complex, zetia, lasix, toprol, MVI, compazine, crestor, diovan, vit D, warfarin   Labs: 6/14 - glucose 134   Anthropometrics: Weights have decreased 25 lbs (13.5%) in 5 weeks; significant   Height: 5'8" Weight: 185 lb 13.6 oz (6/14) UBW: 228 lb (per pt Sept. 2022) per chart 221 lb 3.2 oz on 05/08/21 BMI: 28.26  5/9 - 210 lb 12.8 oz  5/30 - 195 lb 3.2 oz   NUTRITION DIAGNOSIS: Unintentional weight loss related to cancer and associated treatments as evidenced by reported decreased appetite, altered taste, 13% weight loss in 5 weeks which is significant for time frame. Highly suspect degree of malnutrition, however unable to identify at this time without completing nutrition focused physical exam.     INTERVENTION:  Educated on importance of adequate calorie and protein energy intake to maintain weights/strength Encouraged small frequent meals and snacks vs 3 larger meals  Discussed ways to add calories and protein to foods (using whole milk in  oatmeal, creamy based soups, adding cheese, cooking with butter, adding gravy/extra sauce) Suggested soft moist high protein foods for ease of intake Discussed strategies for altered taste, suggested pt try baking soda salt water rinses several times daily and before meals Continue drinking Ensure Plus/equivalent, recommend increasing to 2 daily Handouts, samples, coupons, contact information left at registration desk - pt will pick this up 6/20    MONITORING, EVALUATION, GOAL: Patient will tolerate increased calories and protein to minimize further weight loss    Next Visit: via telephone ~4 weeks

## 2022-01-27 ENCOUNTER — Inpatient Hospital Stay (HOSPITAL_COMMUNITY): Payer: No Typology Code available for payment source

## 2022-01-27 ENCOUNTER — Inpatient Hospital Stay (HOSPITAL_BASED_OUTPATIENT_CLINIC_OR_DEPARTMENT_OTHER): Payer: No Typology Code available for payment source | Admitting: Hematology

## 2022-01-27 ENCOUNTER — Ambulatory Visit (INDEPENDENT_AMBULATORY_CARE_PROVIDER_SITE_OTHER): Payer: Medicare HMO

## 2022-01-27 ENCOUNTER — Encounter (HOSPITAL_COMMUNITY): Payer: Self-pay | Admitting: Hematology

## 2022-01-27 ENCOUNTER — Ambulatory Visit (INDEPENDENT_AMBULATORY_CARE_PROVIDER_SITE_OTHER): Payer: No Typology Code available for payment source

## 2022-01-27 VITALS — BP 108/88 | HR 67 | Temp 97.7°F | Resp 18

## 2022-01-27 DIAGNOSIS — I255 Ischemic cardiomyopathy: Secondary | ICD-10-CM

## 2022-01-27 DIAGNOSIS — Z5112 Encounter for antineoplastic immunotherapy: Secondary | ICD-10-CM | POA: Diagnosis not present

## 2022-01-27 DIAGNOSIS — Z5189 Encounter for other specified aftercare: Secondary | ICD-10-CM | POA: Diagnosis not present

## 2022-01-27 DIAGNOSIS — C8513 Unspecified B-cell lymphoma, intra-abdominal lymph nodes: Secondary | ICD-10-CM | POA: Diagnosis not present

## 2022-01-27 DIAGNOSIS — Z5111 Encounter for antineoplastic chemotherapy: Secondary | ICD-10-CM | POA: Diagnosis present

## 2022-01-27 DIAGNOSIS — C8510 Unspecified B-cell lymphoma, unspecified site: Secondary | ICD-10-CM

## 2022-01-27 DIAGNOSIS — Z95810 Presence of automatic (implantable) cardiac defibrillator: Secondary | ICD-10-CM

## 2022-01-27 DIAGNOSIS — I5042 Chronic combined systolic (congestive) and diastolic (congestive) heart failure: Secondary | ICD-10-CM

## 2022-01-27 LAB — CBC WITH DIFFERENTIAL/PLATELET
Abs Immature Granulocytes: 0.39 10*3/uL — ABNORMAL HIGH (ref 0.00–0.07)
Basophils Absolute: 0.1 10*3/uL (ref 0.0–0.1)
Basophils Relative: 1 %
Eosinophils Absolute: 0.1 10*3/uL (ref 0.0–0.5)
Eosinophils Relative: 1 %
HCT: 36.4 % — ABNORMAL LOW (ref 39.0–52.0)
Hemoglobin: 11.9 g/dL — ABNORMAL LOW (ref 13.0–17.0)
Immature Granulocytes: 5 %
Lymphocytes Relative: 18 %
Lymphs Abs: 1.5 10*3/uL (ref 0.7–4.0)
MCH: 31.2 pg (ref 26.0–34.0)
MCHC: 32.7 g/dL (ref 30.0–36.0)
MCV: 95.5 fL (ref 80.0–100.0)
Monocytes Absolute: 0.7 10*3/uL (ref 0.1–1.0)
Monocytes Relative: 8 %
Neutro Abs: 5.8 10*3/uL (ref 1.7–7.7)
Neutrophils Relative %: 67 %
Platelets: 200 10*3/uL (ref 150–400)
RBC: 3.81 MIL/uL — ABNORMAL LOW (ref 4.22–5.81)
RDW: 14.5 % (ref 11.5–15.5)
WBC: 8.6 10*3/uL (ref 4.0–10.5)
nRBC: 0 % (ref 0.0–0.2)

## 2022-01-27 LAB — CUP PACEART REMOTE DEVICE CHECK
Battery Remaining Longevity: 104 mo
Battery Voltage: 3.01 V
Brady Statistic AP VP Percent: 0.01 %
Brady Statistic AP VS Percent: 5.2 %
Brady Statistic AS VP Percent: 0.07 %
Brady Statistic AS VS Percent: 94.72 %
Brady Statistic RA Percent Paced: 5.09 %
Brady Statistic RV Percent Paced: 0.08 %
Date Time Interrogation Session: 20230620033424
HighPow Impedance: 83 Ohm
Implantable Lead Implant Date: 20210622
Implantable Lead Implant Date: 20210622
Implantable Lead Location: 753859
Implantable Lead Location: 753860
Implantable Lead Model: 5076
Implantable Pulse Generator Implant Date: 20210622
Lead Channel Impedance Value: 304 Ohm
Lead Channel Impedance Value: 361 Ohm
Lead Channel Impedance Value: 361 Ohm
Lead Channel Pacing Threshold Amplitude: 0.5 V
Lead Channel Pacing Threshold Amplitude: 0.5 V
Lead Channel Pacing Threshold Pulse Width: 0.4 ms
Lead Channel Pacing Threshold Pulse Width: 0.4 ms
Lead Channel Sensing Intrinsic Amplitude: 4.125 mV
Lead Channel Sensing Intrinsic Amplitude: 4.125 mV
Lead Channel Sensing Intrinsic Amplitude: 7.875 mV
Lead Channel Sensing Intrinsic Amplitude: 7.875 mV
Lead Channel Setting Pacing Amplitude: 1.5 V
Lead Channel Setting Pacing Amplitude: 2.5 V
Lead Channel Setting Pacing Pulse Width: 0.4 ms
Lead Channel Setting Sensing Sensitivity: 0.3 mV

## 2022-01-27 LAB — COMPREHENSIVE METABOLIC PANEL
ALT: 33 U/L (ref 0–44)
AST: 28 U/L (ref 15–41)
Albumin: 3.2 g/dL — ABNORMAL LOW (ref 3.5–5.0)
Alkaline Phosphatase: 77 U/L (ref 38–126)
Anion gap: 8 (ref 5–15)
BUN: 24 mg/dL — ABNORMAL HIGH (ref 8–23)
CO2: 26 mmol/L (ref 22–32)
Calcium: 8.9 mg/dL (ref 8.9–10.3)
Chloride: 100 mmol/L (ref 98–111)
Creatinine, Ser: 1.33 mg/dL — ABNORMAL HIGH (ref 0.61–1.24)
GFR, Estimated: 55 mL/min — ABNORMAL LOW (ref >60)
Glucose, Bld: 181 mg/dL — ABNORMAL HIGH (ref 70–99)
Potassium: 4.2 mmol/L (ref 3.5–5.1)
Sodium: 134 mmol/L — ABNORMAL LOW (ref 135–145)
Total Bilirubin: 0.7 mg/dL (ref 0.3–1.2)
Total Protein: 6.3 g/dL — ABNORMAL LOW (ref 6.5–8.1)

## 2022-01-27 LAB — PHOSPHORUS: Phosphorus: 3.4 mg/dL (ref 2.5–4.6)

## 2022-01-27 LAB — URIC ACID: Uric Acid, Serum: 3.5 mg/dL — ABNORMAL LOW (ref 3.7–8.6)

## 2022-01-27 LAB — LACTATE DEHYDROGENASE: LDH: 143 U/L (ref 98–192)

## 2022-01-27 LAB — MAGNESIUM: Magnesium: 2 mg/dL (ref 1.7–2.4)

## 2022-01-27 MED ORDER — SODIUM CHLORIDE 0.9% FLUSH
10.0000 mL | INTRAVENOUS | Status: DC | PRN
  Administered 2022-01-27: 10 mL

## 2022-01-27 MED ORDER — ACETAMINOPHEN 325 MG PO TABS
650.0000 mg | ORAL_TABLET | Freq: Once | ORAL | Status: AC
  Administered 2022-01-27: 650 mg via ORAL
  Filled 2022-01-27: qty 2

## 2022-01-27 MED ORDER — FAMOTIDINE IN NACL 20-0.9 MG/50ML-% IV SOLN
20.0000 mg | Freq: Once | INTRAVENOUS | Status: AC
  Administered 2022-01-27: 20 mg via INTRAVENOUS
  Filled 2022-01-27: qty 50

## 2022-01-27 MED ORDER — SODIUM CHLORIDE 0.9 % IV SOLN: Freq: Once | INTRAVENOUS | Status: AC

## 2022-01-27 MED ORDER — VINCRISTINE SULFATE CHEMO INJECTION 1 MG/ML
2.0000 mg | Freq: Once | INTRAVENOUS | Status: AC
  Administered 2022-01-27: 2 mg via INTRAVENOUS
  Filled 2022-01-27: qty 2

## 2022-01-27 MED ORDER — PALONOSETRON HCL INJECTION 0.25 MG/5ML
0.2500 mg | Freq: Once | INTRAVENOUS | Status: DC
  Filled 2022-01-27: qty 5

## 2022-01-27 MED ORDER — SODIUM CHLORIDE 0.9 % IV SOLN
50.0000 mg/m2 | Freq: Once | INTRAVENOUS | Status: AC
  Administered 2022-01-27: 110 mg via INTRAVENOUS
  Filled 2022-01-27: qty 5.5

## 2022-01-27 MED ORDER — SODIUM CHLORIDE 0.9 % IV SOLN
375.0000 mg/m2 | Freq: Once | INTRAVENOUS | Status: AC
  Administered 2022-01-27: 800 mg via INTRAVENOUS
  Filled 2022-01-27: qty 30

## 2022-01-27 MED ORDER — SODIUM CHLORIDE 0.9 % IV SOLN
750.0000 mg/m2 | Freq: Once | INTRAVENOUS | Status: AC
  Administered 2022-01-27: 1620 mg via INTRAVENOUS
  Filled 2022-01-27: qty 81

## 2022-01-27 MED ORDER — HEPARIN SOD (PORK) LOCK FLUSH 100 UNIT/ML IV SOLN
500.0000 [IU] | Freq: Once | INTRAVENOUS | Status: AC | PRN
  Administered 2022-01-27: 500 [IU]

## 2022-01-27 MED ORDER — PALONOSETRON HCL INJECTION 0.25 MG/5ML
0.2500 mg | Freq: Once | INTRAVENOUS | Status: AC
  Administered 2022-01-27: 0.25 mg via INTRAVENOUS

## 2022-01-27 MED ORDER — SODIUM CHLORIDE 0.9 % IV SOLN
10.0000 mg | Freq: Once | INTRAVENOUS | Status: AC
  Administered 2022-01-27: 10 mg via INTRAVENOUS
  Filled 2022-01-27: qty 10

## 2022-01-27 MED ORDER — DIPHENHYDRAMINE HCL 25 MG PO CAPS
50.0000 mg | ORAL_CAPSULE | Freq: Once | ORAL | Status: AC
  Administered 2022-01-27: 50 mg via ORAL
  Filled 2022-01-27: qty 2

## 2022-01-27 NOTE — Progress Notes (Signed)
[place], [place] [address]   CLINIC:  Medical Oncology/Hematology  PCP:  Biagio Borg, MD [place] / [place] [id number] [phone]   REASON FOR VISIT:  Follow-up for new start chemotherapy follow-up visit (high-grade B-cell lymphoma)  PRIOR THERAPY: none  CURRENT THERAPY:  R-CEOP q21d x 3 Cycles  BRIEF ONCOLOGIC HISTORY:  Oncology History  High grade B-cell lymphoma ([place])  12/03/2021 Initial Diagnosis   High grade B-cell lymphoma ([place])   12/15/2021 -  Chemotherapy   Patient is on Treatment Plan : NON-HODGKIN'S LYMPHOMA R-CEOP q21d x 3 Cycles       CANCER STAGING:  Cancer Staging  High grade B-cell lymphoma ([place]) Staging form: Hodgkin and Non-Hodgkin Lymphoma, AJCC 8th Edition - Clinical stage from 12/03/2021: Stage IV (Diffuse large B-cell lymphoma) - Unsigned   INTERVAL HISTORY:  [name], a 76 y.o. male, returns for routine follow-up and consideration for next cycle of chemotherapy. Damarie was last seen on 01/06/2022.  Due for cycle #3 of R-CEOP today.   Overall, he tells me he has been feeling pretty well. He is drinking 16-20 ounces of Gatorade daily. He denies tingling/numbness. He is drinking 1 Boost daily. He has lost 5 lbs since his last visit.   Overall, he feels ready for next cycle of chemo today.    REVIEW OF SYSTEMS:  Review of Systems  Constitutional:  Positive for fatigue and unexpected weight change (-5 lbs). Negative for appetite change.  Respiratory:  Positive for cough.   Cardiovascular:  Positive for chest pain (L breast).  Gastrointestinal:  Positive for nausea.  Neurological:  Positive for dizziness and numbness (R foot).  Psychiatric/Behavioral:  Positive for sleep disturbance.   All other systems reviewed and are negative.   PAST MEDICAL/SURGICAL HISTORY:  Past Medical History:  Diagnosis Date   ALLERGIC RHINITIS 10/31/2007   Qualifier: Diagnosis of  By: Jenny Reichmann MD,  [place] Oris    Allergy    CHOLECYSTECTOMY, HX OF 06/04/2007   Qualifier: Diagnosis of  By: Danny Lawless [place], [place]     COLONIC POLYPS, HX OF 10/31/2007   Qualifier: Diagnosis of  By: Jenny Reichmann MD, [place] GRAFT, HX OF 06/04/2007   Qualifier: Diagnosis of  By: [place], [place]     CORONARY ARTERY DISEASE 06/04/2007   Qualifier: Diagnosis of  By: Danny Lawless CMA, [place]     Diverticulosis    Erectile dysfunction    Eye twitch    right eye since chilhood    HYPERLIPIDEMIA 06/04/2007   Qualifier: Diagnosis of  By: [place], [place]     Hypersomnolence    HYPERTENSION 06/04/2007   Qualifier: Diagnosis of  By: [place], [place]     Hypothyroidism    ISCHEMIC CARDIOMYOPATHY 06/04/2007   Qualifier: Diagnosis of  By: [place], [place]     Myocardial infarction ([place])    per patient , his occurred in 1998    Osteoarthritis    PLMD (periodic limb movement disorder)    Sleep apnea    no cpap' per patient , "i was checked for it and they said i didnt have it "    Vertigo    Vitamin D deficiency    Past Surgical History:  Procedure Laterality Date   BIOPSY  11/25/2021   Procedure: BIOPSY;  Surgeon: Jackquline Denmark, MD;  Location: [facility];  Service: Gastroenterology;;   CHOLECYSTECTOMY     COLONOSCOPY  COLONOSCOPY WITH PROPOFOL N/A 11/25/2021   Procedure: COLONOSCOPY WITH PROPOFOL;  Surgeon: Jackquline Denmark, MD;  Location: [facility];  Service: Gastroenterology;  Laterality: N/A;   CORONARY ARTERY BYPASS GRAFT     ESOPHAGOGASTRODUODENOSCOPY (EGD) WITH PROPOFOL N/A 11/25/2021   Procedure: ESOPHAGOGASTRODUODENOSCOPY (EGD) WITH PROPOFOL;  Surgeon: Jackquline Denmark, MD;  Location: [facility];  Service: Gastroenterology;  Laterality: N/A;   ICD IMPLANT N/A 01/30/2020   Procedure: ICD IMPLANT;  Surgeon: Thompson Grayer, MD;  Location: [facility];  Service: Cardiovascular;  Laterality: N/A;   IR IMAGING GUIDED PORT INSERTION  12/04/2021   KNEE ARTHROPLASTY Left 08/05/2017    Procedure: LEFT TOTAL KNEE ARTHROPLASTY WITH COMPUTER NAVIGATION;  Surgeon: Rod Can, MD;  Location: [facility];  Service: Orthopedics;  Laterality: Left;  Needs RNFA   PENILE PROSTHESIS IMPLANT     POLYPECTOMY  11/25/2021   Procedure: POLYPECTOMY;  Surgeon: Jackquline Denmark, MD;  Location: [facility];  Service: Gastroenterology;;   REPLACEMENT TOTAL KNEE  06/12/2013   spinal cyst     THORACOTOMY     left anterior; wound exploration and debridement   TONSILLECTOMY AND ADENOIDECTOMY     age 65    SOCIAL HISTORY:  Social History   Socioeconomic History   Marital status: Married    Spouse name: [name] [name]   Number of children: 3   Years of education: Not on file   Highest education level: Not on file  Occupational History   Occupation: truck Education administrator: [name]  Tobacco Use   Smoking status: Former    Packs/day: 4.00    Years: 17.00    Total pack years: 68.00    Types: Cigarettes    Quit date: 05/07/1981    Years since quitting: 40.7   Smokeless tobacco: Never   Tobacco comments:    Pt states that he would let most of them "burn" pt states that he used anywhere between 4-5PPD  Vaping Use   Vaping Use: Never used  Substance and Sexual Activity   Alcohol use: Yes    Alcohol/week: 0.0 standard drinks of alcohol    Comment: rare   Drug use: No   Sexual activity: Not on file  Other Topics Concern   Not on file  Social History Narrative   Lives in [place] with spouse   Retired Administrator   Social Determinants of Health   Financial Resource Strain: [place]  (10/27/2021)   Overall Financial Resource Strain (CARDIA)    Difficulty of Paying Living Expenses: Not hard at all  Food Insecurity: No Food Insecurity (10/27/2021)   Hunger Vital Sign    Worried About Running Out of Food in the Last Year: Never true    Ran Out of Food in the Last Year: Never true  Transportation Needs: No Transportation Needs (10/27/2021)   PRAPARE - Radiographer, therapeutic (Medical): No    Lack of Transportation (Non-Medical): No  Physical Activity: Inactive (10/27/2021)   Exercise Vital Sign    Days of Exercise per Week: 0 days    Minutes of Exercise per Session: 0 min  Stress: No Stress Concern Present (10/27/2021)   [place]    Feeling of Stress : Not at all  Social Connections: Moderately Isolated (10/27/2021)   Social Connection and Isolation Panel [NHANES]    Frequency of Communication with Friends and Family: More than three times a week    Frequency of  Social Gatherings with Friends and Family: More than three times a week    Attends Religious Services: Never    Marine scientist or Organizations: No    Attends Archivist Meetings: Never    Marital Status: Married  Human resources officer Violence: Not At Risk (10/27/2021)   Humiliation, Afraid, Rape, and Kick questionnaire    Fear of Current or Ex-Partner: No    Emotionally Abused: No    Physically Abused: No    Sexually Abused: No    FAMILY HISTORY:  Family History  Problem Relation Age of Onset   Stomach cancer Mother        smokes   Lung cancer Father        chewed tobacco   Heart disease Brother        first MI at 77yo, now 53 for transplant list/ ICM   Pancreatic cancer Brother    Colon cancer Maternal Grandfather    COPD Son        was a smoker   Pancreatic cancer Maternal Uncle    Pancreatic cancer Cousin        mat side x 4   Esophageal cancer Neg Hx    Prostate cancer Neg Hx    Rectal cancer Neg Hx     CURRENT MEDICATIONS:  Current Outpatient Medications  Medication Sig Dispense Refill   allopurinol (ZYLOPRIM) 300 MG tablet Take 1 tablet (300 mg total) by mouth daily. 30 tablet 3   ARTIFICIAL TEAR SOLUTION OP Place 1 drop into both eyes daily as needed (dry eyes).     Ascorbic Acid (VITA-C PO) Take 1,000 mg by mouth daily.     azithromycin (ZITHROMAX) 500 MG tablet Take 1 tablet  (500 mg total) by mouth daily. 5 tablet 0   b complex vitamins capsule Take 1 capsule by mouth daily.     cefdinir (OMNICEF) 300 MG capsule Take 1 capsule (300 mg total) by mouth every 12 (twelve) hours. 10 capsule 0   ezetimibe (ZETIA) 10 MG tablet TAKE 1 TABLET EVERY DAY (Patient taking differently: Take by mouth every other day.) 90 tablet 3   feeding supplement (ENSURE ENLIVE / ENSURE PLUS) LIQD Take 237 mLs by mouth 3 (three) times daily between meals. 237 mL 12   furosemide (LASIX) 20 MG tablet Take 1 tablet (20 mg total) by mouth daily. 90 tablet 3   lidocaine-prilocaine (EMLA) cream Apply a small amount to port a cath site (do not rub in) and cover with plastic wrap 1 hour prior to infusion appointments 30 g 3   metoprolol succinate (TOPROL-XL) 50 MG 24 hr tablet TAKE 1 TABLET DAILY. TAKE WITH OR IMMEDIATELY FOLLOWING A MEAL. 90 tablet 1   Multiple Vitamins-Minerals (MULTIVITAMIN WITH MINERALS) tablet Take 1 tablet by mouth daily. Centrum silver     prochlorperazine (COMPAZINE) 10 MG tablet Take 1 tablet (10 mg total) by mouth every 6 (six) hours as needed for nausea or vomiting. 30 tablet 3   rosuvastatin (CRESTOR) 5 MG tablet TAKE 1 TABLET EVERY OTHER DAY 45 tablet 10   spironolactone (ALDACTONE) 25 MG tablet Take 0.5 tablets (12.5 mg total) by mouth daily. (Patient taking differently: Take 25 mg by mouth daily.) 45 tablet 3   triamcinolone (NASACORT) 55 MCG/ACT AERO nasal inhaler Place 2 sprays into the nose daily. (Patient taking differently: Place 2 sprays into the nose daily as needed (allergies).) 3 Inhaler 3   valsartan (DIOVAN) 40 MG tablet Take 1 tablet (40 mg  total) by mouth daily. 30 tablet 1   VITAMIN D PO Take 1,000 Units by mouth daily.     warfarin (COUMADIN) 5 MG tablet TAKE 1 TABLET EVERY DAY AT 4 PM (Patient taking differently: Take 2.5-5 mg by mouth See admin instructions. Taking 5 mg every day except on Friday taking 1/2 tablet = 2.5 mg) 90 tablet 4   No current  facility-administered medications for this visit.    ALLERGIES:  Allergies  Allergen Reactions   Ace Inhibitors Cough   Statins Other (See Comments)    Muscle pains    PHYSICAL EXAM:  Performance status (ECOG): 0 - Asymptomatic  There were no vitals filed for this visit. Wt Readings from Last 3 Encounters:  01/21/22 185 lb 13.6 oz (84.3 kg)  01/08/22 203 lb (92.1 kg)  01/07/22 204 lb 6.4 oz (92.7 kg)   Physical Exam Vitals reviewed.  Constitutional:      Appearance: Normal appearance.  Cardiovascular:     Rate and Rhythm: Normal rate and regular rhythm.     Pulses: Normal pulses.     Heart sounds: Normal heart sounds.  Pulmonary:     Effort: Pulmonary effort is normal.     Breath sounds: Normal breath sounds.  Musculoskeletal:     Right lower leg: No edema.     Left lower leg: No edema.  Neurological:     General: No focal deficit present.     Mental Status: He is alert and oriented to person, place, and time.  Psychiatric:        Mood and Affect: Mood normal.        Behavior: Behavior normal.     LABORATORY DATA:  I have reviewed the labs as listed.     Latest Ref Rng & Units 01/21/2022    5:05 AM 01/20/2022    5:18 AM 01/19/2022   11:08 AM  CBC  WBC 4.0 - 10.5 K/uL 11.6  12.6  12.3   Hemoglobin 13.0 - 17.0 g/dL 12.0  11.7  13.1   Hematocrit 39.0 - 52.0 % 36.4  35.9  40.5   Platelets 150 - 400 K/uL 151  149  155       Latest Ref Rng & Units 01/21/2022    5:05 AM 01/20/2022    5:18 AM 01/19/2022   11:08 AM  CMP  Glucose 70 - 99 mg/dL 134  110  150   BUN 8 - 23 mg/dL _0 Creatinine 0.61 - 1.24 mg/dL 1.11  1.08  1.35   Sodium 135 - 145 mmol/L 137  137  136   Potassium 3.5 - 5.1 mmol/L 4.4  4.6  4.8   Chloride 98 - 111 mmol/L 104  103  101   CO2 22 - 32 mmol/L _1 Calcium 8.9 - 10.3 mg/dL 8.6  8.6  8.8   Total Protein 6.5 - 8.1 g/dL   6.9   Total Bilirubin 0.3 - 1.2 mg/dL   1.1   Alkaline Phos 38 - 126 U/L   123   AST 15 - 41 U/L   44    ALT 0 - 44 U/L   67     DIAGNOSTIC IMAGING:  I have independently reviewed the scans and discussed with the patient. CT Angio Chest PE W and/or Wo Contrast  Result Date: 01/19/2022 CLINICAL DATA:  Cough, shortness of breath EXAM: CT ANGIOGRAPHY CHEST WITH CONTRAST TECHNIQUE: Multidetector CT imaging of  the chest was performed using the standard protocol during bolus administration of intravenous contrast. Multiplanar CT image reconstructions and MIPs were obtained to evaluate the vascular anatomy. RADIATION DOSE REDUCTION: This exam was performed according to the departmental dose-optimization program which includes automated exposure control, adjustment of the mA and/or kV according to patient size and/or use of iterative reconstruction technique. CONTRAST:  48mL OMNIPAQUE IOHEXOL 350 MG/ML SOLN COMPARISON:  Previous studies including chest radiographs done earlier today FINDINGS: Cardiovascular: There is homogeneous enhancement in thoracic aorta. There is ectasia of ascending thoracic aorta measuring 3.8 cm. Coronary artery calcifications are seen. There are no intraluminal filling defects in the pulmonary artery branches. Mediastinum/Nodes: No significant lymphadenopathy seen. Lungs/Pleura: Increased interstitial markings are seen in the left upper lobe with tree-in-bud appearance. Small patchy infiltrate is seen in the posterior right lower lung fields. Small patchy infiltrate is noted in the right posterior costophrenic angle. Small patchy infiltrates are seen in the left lower lobe. There is no focal pulmonary consolidation. There is no pleural effusion or pneumothorax. Upper Abdomen: Surgical clips are seen in gallbladder fossa. Musculoskeletal: Unremarkable. Review of the MIP images confirms the above findings. IMPRESSION: There is no evidence of pulmonary artery embolism. Coronary artery calcifications are seen. There is ectasia of ascending thoracic aorta measuring 3.8 cm. There is no evidence of  thoracic aortic dissection. There is no focal pulmonary consolidation. Small scattered foci of increased interstitial markings are seen in the left upper lobe and both lower lobes. This finding may suggest scarring or interstitial pneumonia. Electronically Signed   By: [name] [name] M.D.   On: 01/19/2022 13:25   DG Chest Port 1 View  Result Date: 01/19/2022 CLINICAL DATA:  Chest pain and shortness of breath.  Cough. EXAM: PORTABLE CHEST 1 VIEW COMPARISON:  10/09/2021 FINDINGS: The heart size and mediastinal contours are within normal limits. Pacemaker remains in stable position. New right-sided power port is seen with tip overlying the distal SVC. No evidence of pneumothorax. Both lungs are clear. The visualized skeletal structures are unremarkable. IMPRESSION: New right-sided power port in appropriate position. No active cardiopulmonary disease. Electronically Signed   By: [name] [name] M.D.   On: 01/19/2022 11:33   VAS [place] LOWER EXTREMITY ARTERIAL DUPLEX  Result Date: 01/12/2022 LOWER EXTREMITY ARTERIAL DUPLEX STUDY Patient Name:  [name] [name]  Date of Exam:   01/12/2022 Medical Rec #: [id number]           Accession #:    [id number] Date of Birth: Oct 17, 1945           Patient Gender: M Patient Age:   36 years Exam Location:  [facility] Procedure:      VAS [place] LOWER EXTREMITY ARTERIAL DUPLEX Referring Phys: Barrington Ellison --------------------------------------------------------------------------------  Indications: Patient has had right lateral calf pain since 06/2021. He also has              noticed while in a seated position there is pain behind the right              knee. He denies claudication and rest pain symptoms but has leg              weakness and low stamina. Patient being treated for stomach              cancer/lymphoma with chemotherapy. High Risk Factors: Hypertension, hyperlipidemia, past history of smoking,                    coronary  artery disease. Other Factors: SEE ABI REPORT                  History of CABG and defibrillator.  Current ABI: Right 1.3 Left 1.3 Comparison Study: None Performing Technologist: Alecia Mackin RVT, RDCS (AE), RDMS  Examination Guidelines: A complete evaluation includes B-mode imaging, spectral Doppler, color Doppler, and power Doppler as needed of all accessible portions of each vessel. Bilateral testing is considered an integral part of a complete examination. Limited examinations for reoccurring indications may be performed as noted.  +-----------+--------+-----+--------+-----------+------------------------------+ RIGHT      PSV cm/sRatioStenosisWaveform   Comments                       +-----------+--------+-----+--------+-----------+------------------------------+ CFA Prox   97                   triphasic                                 +-----------+--------+-----+--------+-----------+------------------------------+ DFA        82                   triphasic                                 +-----------+--------+-----+--------+-----------+------------------------------+ SFA Prox   107                  triphasic                                 +-----------+--------+-----+--------+-----------+------------------------------+ SFA Mid    136                  triphasic                                 +-----------+--------+-----+--------+-----------+------------------------------+ SFA Distal 108                  triphasic  flow is disturbed              +-----------+--------+-----+--------+-----------+------------------------------+ POP Mid                                    aneurysm 2.3 x 2.7 x 3.1 cm    +-----------+--------+-----+--------+-----------+------------------------------+ POP Distal 39                   triphasic  aneurysm 3.7 x 3.9 x 4.4 cm,                                              thrombus within. Length of                                                aneurysm is approxiamtely 9.3  cm.                            +-----------+--------+-----+--------+-----------+------------------------------+ TP Trunk   77                                                             +-----------+--------+-----+--------+-----------+------------------------------+ ATA Prox   64                   triphasic  flow is disturbed              +-----------+--------+-----+--------+-----------+------------------------------+ ATA Distal 79                   multiphasic                               +-----------+--------+-----+--------+-----------+------------------------------+ PTA Prox   36                   triphasic  flow is disturbed              +-----------+--------+-----+--------+-----------+------------------------------+ PTA Distal 56                   multiphasic                               +-----------+--------+-----+--------+-----------+------------------------------+ PERO Prox  95                   triphasic  flow is disturbed              +-----------+--------+-----+--------+-----------+------------------------------+ PERO Distal102                  triphasic                                 +-----------+--------+-----+--------+-----------+------------------------------+  +-----------+--------+-----+--------+---------+--------+ LEFT       PSV cm/sRatioStenosisWaveform Comments +-----------+--------+-----+--------+---------+--------+ CFA Prox   81                   triphasic         +-----------+--------+-----+--------+---------+--------+ DFA        54                   triphasic         +-----------+--------+-----+--------+---------+--------+ SFA Prox   65                   triphasic         +-----------+--------+-----+--------+---------+--------+ SFA Mid    80                   triphasic         +-----------+--------+-----+--------+---------+--------+ SFA Distal 76                    triphasic         +-----------+--------+-----+--------+---------+--------+ POP Prox   78                   triphasic         +-----------+--------+-----+--------+---------+--------+ POP Distal 54  triphasic         +-----------+--------+-----+--------+---------+--------+ TP Trunk   37                   triphasic         +-----------+--------+-----+--------+---------+--------+ ATA Prox   53                   biphasic          +-----------+--------+-----+--------+---------+--------+ ATA Distal 116                  triphasic         +-----------+--------+-----+--------+---------+--------+ PTA Prox   71                   triphasic         +-----------+--------+-----+--------+---------+--------+ PTA Distal 102                  triphasic         +-----------+--------+-----+--------+---------+--------+ PERO Prox  65                   triphasic         +-----------+--------+-----+--------+---------+--------+ PERO Distal59                   triphasic         +-----------+--------+-----+--------+---------+--------+  Findings reported to Barrington Ellison, PA-C through [facility] email and Dr. Oval Linsey (DOD) at 3:15 pm .  Summary: Right: No evidence of stenosis, athereosclerosis noted throughout. Large distal popiteal arterial aneurysm. See above. Left: No evidence of stenosis seen, atherolsclerosis noted throughout.  See table(s) above for measurements and observations. Vascular consult recommended. Electronically signed by [name] Rouge MD on 01/12/2022 at 8:37:45 PM.    Final    VAS [place] LOWER EXT ART SEG MULTI (SEGMENTALS & LE RAYNAUDS)  Result Date: 01/12/2022  LOWER EXTREMITY DOPPLER STUDY Patient Name:  [name] [name]  Date of Exam:   01/12/2022 Medical Rec #: [id number]           Accession #:    [id number] Date of Birth: 1946/01/12           Patient Gender: M Patient Age:   49 years Exam Location:  [facility] Procedure:      VAS [place] LOWER  EXT ART SEG MULTI ([place]) Referring Phys: Shirley Friar --------------------------------------------------------------------------------  Indications: Patient has had right lateral calf pain since 06/2021. He also has              noticed while in a seated position there is pain behind the right              knee. He denies claudication and rest pain symptoms but has leg              weakness and low stamina. Patient being treated for stomach              cancer/lymphoma with chemotherapy. High Risk Factors: Hypertension, hyperlipidemia, past history of smoking,                    coronary artery disease. Other Factors: History of CABG and defibrillator.                 SEE BILATERAL LEG ARTERIAL DUPLEX REPORT.  Comparison Study: None Performing Technologist: Salvadore Dom RVT  Examination Guidelines: A complete evaluation includes at minimum, Doppler waveform signals and systolic blood pressure reading at the level of  bilateral brachial, anterior tibial, and posterior tibial arteries, when vessel segments are accessible. Bilateral testing is considered an integral part of a complete examination. Photoelectric Plethysmograph (PPG) waveforms and toe systolic pressure readings are included as required and additional duplex testing as needed. Limited examinations for reoccurring indications may be performed as noted.  ABI Findings: +---------+------------------+-----+---------+---------------------------------+ Right    Rt Pressure (mmHg)IndexWaveform Comment                           +---------+------------------+-----+---------+---------------------------------+ Brachial 105                                                               +---------+------------------+-----+---------+---------------------------------+ CFA                             triphasic                                  +---------+------------------+-----+---------+---------------------------------+  Popliteal                       triphasicdisturbed flow with                                                        bruit/pulsatile mass felt         +---------+------------------+-----+---------+---------------------------------+ PTA      144               1.30 triphasic                                  +---------+------------------+-----+---------+---------------------------------+ PERO     142               1.28 triphasic                                  +---------+------------------+-----+---------+---------------------------------+ DP       133               1.20 triphasic                                  +---------+------------------+-----+---------+---------------------------------+ Great Toe59                0.53 Abnormal                                   +---------+------------------+-----+---------+---------------------------------+ +---------+------------------+-----+---------+-------+ Left     Lt Pressure (mmHg)IndexWaveform Comment +---------+------------------+-----+---------+-------+ Brachial 111                    triphasic        +---------+------------------+-----+---------+-------+ CFA  triphasic        +---------+------------------+-----+---------+-------+ Popliteal                       triphasic        +---------+------------------+-----+---------+-------+ PTA      144               1.30 triphasic        +---------+------------------+-----+---------+-------+ PERO     129               1.23 triphasic        +---------+------------------+-----+---------+-------+ DP       120               1.08 triphasic        +---------+------------------+-----+---------+-------+ Gardiner Rhyme               1.06 Normal           +---------+------------------+-----+---------+-------+ +-------+-----------+-----------+------------+------------+ ABI/TBIToday's ABIToday's TBIPrevious ABIPrevious TBI  +-------+-----------+-----------+------------+------------+ Right  1.3        .53                                 +-------+-----------+-----------+------------+------------+ Left   1.3        1.06                                +-------+-----------+-----------+------------+------------+   Summary: Right: Resting right ankle-brachial index is within normal range. No evidence of significant right lower extremity arterial disease. The right toe-brachial index is abnormal. Left: Resting left ankle-brachial index is within normal range. No evidence of significant left lower extremity arterial disease. The left toe-brachial index is normal. *See table(s) above for measurements and observations.  Electronically signed by [name] Rouge MD on 01/12/2022 at 6:38:18 PM.    Final    ECHOCARDIOGRAM COMPLETE  Result Date: 01/12/2022    ECHOCARDIOGRAM REPORT   Patient Name:   [name] [name] Date of Exam: 01/12/2022 Medical Rec #:  [id number]          Height:       69.0 in Accession #:    [id number]         Weight:       203.0 lb Date of Birth:  17-Dec-1945          BSA:          2.079 m Patient Age:    66 years           BP:           105/70 mmHg Patient Gender: M                  HR:           60 bpm. Exam Location:  Outpatient Procedure: 2D Echo, Cardiac Doppler, Color Doppler and Intracardiac            Opacification Agent Indications:    Z51.11 Encounter for antineoplastic chemotheraphy; R06.9 DOE;                 R60.0 Lower extremity edema  History:        Patient has prior history of Echocardiogram examinations, most                 recent 12/27/2020. Cardiomyopathy, CAD, Prior CABG and  Defibrillator, Signs/Symptoms:Dyspnea and Edema; Risk                 Factors:Hypertension, Dyslipidemia and Former Smoker. Patient                 denies chest pain. He does have DOE and bilateral leg edema.                 Patient undergoing chemotherapy for lymphoma/stomach cancer.  Sonographer:    Salvadore Dom  RVT, RDCS (AE), RDMS Referring Phys: [id number] [facility]  Sonographer Comments: Suboptimal apical window, suboptimal parasternal window and patient is morbidly obese. Image acquisition challenging due to patient body habitus. IMPRESSIONS  1. Left ventricular ejection fraction, by estimation, is 30 to 35%. The left ventricle has moderately decreased function. The left ventricle demonstrates regional wall motion abnormalities (see scoring diagram/findings for description). Left ventricular  diastolic parameters are indeterminate. There is akinesis of the left ventricular, mid-apical anteroseptal wall, inferoseptal wall and apical segment.  2. Right ventricular systolic function is normal. The right ventricular size is normal.  3. Left atrial size was moderately dilated.  4. The mitral valve is grossly normal. Trivial mitral valve regurgitation. No evidence of mitral stenosis.  5. The aortic valve is tricuspid. There is moderate calcification of the aortic valve. Aortic valve regurgitation is mild. Mild aortic valve stenosis.  6. The inferior vena cava is normal in size with <50% respiratory variability, suggesting right atrial pressure of 8 mmHg. Comparison(s): No significant change from prior study. Conclusion(s)/Recommendation(s): No left ventricular mural or apical thrombus/thrombi. Cardiomyopathy unchanged from prior, with wall motion abnormalities as noted. FINDINGS  Left Ventricle: Left ventricular ejection fraction, by estimation, is 30 to 35%. The left ventricle has moderately decreased function. The left ventricle demonstrates regional wall motion abnormalities. Definity contrast agent was given IV to delineate the left ventricular endocardial borders. The left ventricular internal cavity size was normal in size. There is borderline left ventricular hypertrophy. Left ventricular diastolic parameters are indeterminate. Right Ventricle: The right ventricular size is normal. Right vetricular wall  thickness was not well visualized. Right ventricular systolic function is normal. Left Atrium: Left atrial size was moderately dilated. Right Atrium: Right atrial size was normal in size. Pericardium: There is no evidence of pericardial effusion. Mitral Valve: The mitral valve is grossly normal. Trivial mitral valve regurgitation. No evidence of mitral valve stenosis. Tricuspid Valve: The tricuspid valve is not well visualized. Tricuspid valve regurgitation is trivial. No evidence of tricuspid stenosis. Aortic Valve: The aortic valve is tricuspid. There is moderate calcification of the aortic valve. Aortic valve regurgitation is mild. Aortic regurgitation PHT measures 535 msec. Mild aortic stenosis is present. Aortic valve mean gradient measures 7.5 mmHg. Aortic valve peak gradient measures 13.8 mmHg. Aortic valve area, by VTI measures 1.53 cm. Pulmonic Valve: The pulmonic valve was not well visualized. Pulmonic valve regurgitation is trivial. Aorta: The aortic root, ascending aorta, aortic arch and descending aorta are all structurally normal, with no evidence of dilitation or obstruction. Venous: The inferior vena cava is normal in size with less than 50% respiratory variability, suggesting right atrial pressure of 8 mmHg. IAS/Shunts: The interatrial septum was not well visualized.  LEFT VENTRICLE PLAX 2D LVIDd:         5.25 cm   Diastology LVIDs:         3.85 cm   LV e' medial:    5.77 cm/s LV PW:         1.14 cm  LV E/e' medial:  10.3 LV IVS:        0.96 cm   LV e' lateral:   7.83 cm/s LVOT diam:     2.10 cm   LV E/e' lateral: 7.6 LV SV:         61 LV SV Index:   29 LVOT Area:     3.46 cm  RIGHT VENTRICLE RV S prime:     12.30 cm/s TAPSE (M-mode): 1.5 cm LEFT ATRIUM              Index        RIGHT ATRIUM           Index LA diam:        5.00 cm  2.40 cm/m   RA Area:     18.50 cm LA Vol (A2C):   159.0 ml 76.47 ml/m  RA Volume:   55.70 ml  26.79 ml/m LA Vol (A4C):   72.8 ml  35.01 ml/m LA Biplane Vol: 110.0  ml 52.91 ml/m  AORTIC VALVE                     PULMONIC VALVE AV Area (Vmax):    1.88 cm      PV Vmax:          1.26 m/s AV Area (Vmean):   1.59 cm      PV Peak grad:     6.4 mmHg AV Area (VTI):     1.53 cm      PR End Diast Vel: 2.56 msec AV Vmax:           186.00 cm/s AV Vmean:          129.500 cm/s AV VTI:            0.402 m AV Peak Grad:      13.8 mmHg AV Mean Grad:      7.5 mmHg LVOT Vmax:         101.00 cm/s LVOT Vmean:        59.600 cm/s LVOT VTI:          0.177 m LVOT/AV VTI ratio: 0.44 AI PHT:            535 msec AR Vena Contracta: 0.29 cm  AORTA Ao Root diam: 3.20 cm Ao Asc diam:  3.60 cm Ao Arch diam: 3.2 cm MITRAL VALVE MV Area (PHT): 2.91 cm    SHUNTS MV Decel Time: 261 msec    Systemic VTI:  0.18 m MV E velocity: 59.50 cm/s  Systemic Diam: 2.10 cm MV A velocity: 79.50 cm/s MV E/A ratio:  0.75 [name] Dresser MD Electronically signed by [name] Dresser MD Signature Date/Time: 01/12/2022/4:37:03 PM    Final      ASSESSMENT:  Stage IVb high-grade B-cell lymphoma: - He had unintentional weight loss of 20 pounds since September 2022.  Weight has been stable for the last 2 months.  He had multiple family members with pancreatic cancer. - CT AP with contrast (10/31/2021): Abnormal nodular thickening of the right anterior pararenal fascia, suspicious for atypical malignancy.  No evidence of pancreatic malignancy.  Irregular wall thickening of the proximal stomach.  No ascites or peritoneal nodularity. - Biopsy of nodular thickening (11/17/2021): High-grade B-cell lymphoma with Burkitt-like features.  Ki-67 100%. 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  Neoplastic lymphocytes positive for CD20, CD10, Bcl-2, BCL6 and mum 1.  Ki-67 more than  95%.  H pylori IHC negative. - High-grade lymphoma panel (11/17/2021): Negative for BCL6, MYC, BCL2 rearrangements, negative for MYC amplification, t(8:14) not  detected. - High risk features for CNS disease: Age more than 56, stage III/IV, extranodal involvement more than 1 site.  Based on 3 points, intermediate risk.  No clinical signs of CNS involvement. - IPI: High intermediate with 3 points (age more than 31, stage III/IV, extranodal involvement more than 1 site) - R-CEOP cycle 1 started on 12/15/2021    Social/family history: - He has AICD placed in June 2021 secondary to CHF. - He is a retired English as a second language teacher.  He worked at [employer] in [place].  He had exposure to some chemicals.  He quit smoking more than 40 years ago.  He drinks mostly beer and occasionally whiskey at bedtime.    Ischemic cardiomyopathy/CHF/apical thrombus: - He is on Coumadin.  Will check PT/INR today. - Last seen by Dr. [name] on 11/06/2021.   -Last echo on 12/27/2020 with EF 30-35%.  Limited visualization of endocardium.  LV has moderately decreased function.   PLAN:  Stage IVb high-grade B-cell lymphoma: - He was hospitalized for couple of days after cycle 2 from 01/19/2022 through 01/21/2022 with pneumonia. - He finished antibiotics yesterday.  He is feeling better.  Mild weakness present. - He lost 5 pounds in the last 3 weeks due to pneumonia admission.  He is drinking 1 can of boost per day. - Reviewed labs today which showed normal LFTs with low albumin.  CBC was grossly normal.  LDH was normal. - Recommend proceeding with cycle 3 today.  RTC 3 weeks for follow-up with repeat PET CT scan prior to next visit along with labs.  2.  TLS prophylaxis: - Uric acid is normal today.  Allopurinol is causing him nausea and vomiting. - I have told him to discontinue allopurinol.  3.  Acute kidney injury on CKD: - Creatinine today is 1.33.  He will receive 100 mL of normal saline today.   Orders placed this encounter:  No orders of the defined types were placed in this encounter.    [name] Jack, MD [place] [phone]   I, Thana Ates, am acting  as a scribe for Dr. Derek [name].  I, [name] Jack MD, have reviewed the above documentation for accuracy and completeness, and I agree with the above.

## 2022-01-27 NOTE — Patient Instructions (Signed)
[facility]  Discharge Instructions: Thank you for choosing [facility] to provide your oncology and hematology care.  If you have a lab appointment with the [facility], please come in thru the Main Entrance and check in at the main information desk.  Wear comfortable clothing and clothing appropriate for easy access to any Portacath or PICC line.   We strive to give you quality time with your provider. You may need to reschedule your appointment if you arrive late (15 or more minutes).  Arriving late affects you and other patients whose appointments are after yours.  Also, if you miss three or more appointments without notifying the office, you may be dismissed from the clinic at the provider's discretion.      For prescription refill requests, have your pharmacy contact our office and allow 72 hours for refills to be completed.    Today you received the following chemotherapy and/or immunotherapy agents RCHOP   To help prevent nausea and vomiting after your treatment, we encourage you to take your nausea medication as directed.  BELOW ARE SYMPTOMS THAT SHOULD BE REPORTED IMMEDIATELY: *FEVER GREATER THAN 100.4 F (38 C) OR HIGHER *CHILLS OR SWEATING *NAUSEA AND VOMITING THAT IS NOT CONTROLLED WITH YOUR NAUSEA MEDICATION *UNUSUAL SHORTNESS OF BREATH *UNUSUAL BRUISING OR BLEEDING *URINARY PROBLEMS (pain or burning when urinating, or frequent urination) *BOWEL PROBLEMS (unusual diarrhea, constipation, pain near the anus) TENDERNESS IN MOUTH AND THROAT WITH OR WITHOUT PRESENCE OF ULCERS (sore throat, sores in mouth, or a toothache) UNUSUAL RASH, SWELLING OR PAIN  UNUSUAL VAGINAL DISCHARGE OR ITCHING   Items with * indicate a potential emergency and should be followed up as soon as possible or go to the Emergency Department if any problems should occur.  Please show the CHEMOTHERAPY ALERT CARD or IMMUNOTHERAPY ALERT CARD at check-in to the Emergency Department  and triage nurse.  Should you have questions after your visit or need to cancel or reschedule your appointment, please contact [facility] [phone]  and follow the prompts.  Office hours are 8:00 a.m. to 4:30 p.m. Monday - Friday. Please note that voicemails left after 4:00 p.m. may not be returned until the following business day.  We are closed weekends and major holidays. You have access to a nurse at all times for urgent questions. Please call the main number to the clinic [phone] and follow the prompts.  For any non-urgent questions, you may also contact your provider using MyChart. We now offer e-Visits for anyone 5 and older to request care online for non-urgent symptoms. For details visit [email or web address].   Also download the MyChart app! Go to the app store, search "MyChart", open the app, select [facility], and log in with your MyChart username and password.  Masks are optional in the cancer centers. If you would like for your care team to wear a mask while they are taking care of you, please let them know. For doctor visits, patients may have with them one support person who is at least 77 years old. At this time, visitors are not allowed in the infusion area.

## 2022-01-27 NOTE — Patient Instructions (Signed)
[facility] at [facility] Discharge Instructions   You were seen and examined today by Dr. Delton Coombes.  He reviewed your lab work which is normal/stable. Your kidney function is slightly elevated. You should increase your fluid intake to 100 ounces per day to help get this back to normal.   We will proceed with your treatment today.   We will repeat a PET scan prior to your next visit.   Return as scheduled in 3 weeks.    Thank you for choosing [facility] at [facility] to provide your oncology and hematology care.  To afford each patient quality time with our provider, please arrive at least 15 minutes before your scheduled appointment time.   If you have a lab appointment with the [facility] please come in thru the Main Entrance and check in at the main information desk.  You need to re-schedule your appointment should you arrive 10 or more minutes late.  We strive to give you quality time with our providers, and arriving late affects you and other patients whose appointments are after yours.  Also, if you no show three or more times for appointments you may be dismissed from the clinic at the providers discretion.     Again, thank you for choosing [facility].  Our hope is that these requests will decrease the amount of time that you wait before being seen by our physicians.       _____________________________________________________________  Should you have questions after your visit to [facility], please contact our office at [phone] and follow the prompts.  Our office hours are 8:00 a.m. and 4:30 p.m. Monday - Friday.  Please note that voicemails left after 4:00 p.m. may not be returned until the following business day.  We are closed weekends and major holidays.  You do have access to a nurse 24-7, just call the main number to the clinic [phone] and do not press any options, hold on the line and a  nurse will answer the phone.    For prescription refill requests, have your pharmacy contact our office and allow 72 hours.    Due to Covid, you will need to wear a mask upon entering the hospital. If you do not have a mask, a mask will be given to you at the Main Entrance upon arrival. For doctor visits, patients may have 1 support person age 75 or older with them. For treatment visits, patients can not have anyone with them due to social distancing guidelines and our immunocompromised population.

## 2022-01-27 NOTE — Progress Notes (Addendum)
Pt presents today for R-CEOP per provider's order. Vitals signs and labs WNL for treatment. Message received from A.Anderson,RN to give 500 mL of normal saline over 1 hour due to elevated creatinine. Okay to proceed with treatment today per Dr.K.  R-CEOP and 500 mL of normal saline over 1 hour given today per MD orders. Tolerated infusion without adverse affects. Vital signs stable. No complaints at this time. Discharged from clinic ambulatory in stable condition. Alert and oriented x 3. F/U with [facility] as scheduled.

## 2022-01-27 NOTE — Progress Notes (Signed)
EPIC Encounter for ICM Monitoring  Patient Name: [name] [name] is a 76 y.o. male Date: 01/27/2022 Primary Care Physican: Biagio Borg, MD Primary Cardiologist: [name] Electrophysiologist: Allred 09/10/2021 Weight:   206 lbs 10/07/2021 Weight: 206 lbs (does not weight at home) 12/29/2021 Weight: 201 lbs                                                            Spoke with patient and heart failure questions reviewed.  Pt asymptomatic for fluid accumulation.  He was receiving chemo during our conversation.  He was hospitalized 6/12-6/14 for double pneumonia and feeling fine with exception of a little weakness since discharge.    Optivol thoracic impedance suggesting normal fluid levels since hospital discharge.   Prescribed: Furosemide 20 mg take 1 tablet(s) by mouth daily   Jardiance 10 mg take 1 tablet before breakfast   Labs: 01/20/2022 Creatinine 1.08, BUN 22, Potassium 4.6, Sodium 137, GFR >60 01/19/2022 Creatinine 1.35, BUN 26, Potassium 4.8, Sodium 136, GFR 54  01/06/2022 Creatinine 1.64, BUN 30, Potassium 4.4, Sodium 137, GFR 43  12/22/2021 Creatinine 1.13, BUN 38, Potassium 4.0, Sodium 138, GFR >60  12/15/2021 Creatinine 1.37, BUN 29, Potassium 4.5, Sodium 138, GFR 53 12/02/2021 Creatinine 1.60, BUN 26, Potassium 4.7, Sodium 138, GFR 44  11/13/2021 Creatinine 1.79, BUN 33, Potassium 4.2, Sodium 137, GFR 39  10/24/2021 Creatinine 1.41, BUN 30, Potassium 4.9, Sodium 140, GFR 52 10/09/2021 Creatinine 1.45, BUN 27, Potassium 4.3, Sodium 137 A complete set of results can be found in Results Review.   Recommendations:  Hospitalized.   Follow-up plan: ICM clinic phone appointment on 02/23/2022.   91 day device clinic remote transmission 04/28/2022.     EP/Cardiology Office Visits: 03/03/2022 with Dr [name].  Recall 06/14/2022 with Oda Kilts, PA.   Copy of ICM check sent to Dr. Rayann Heman.  3 month ICM trend: 01/27/2022.    12-14 Month ICM trend:     Rosalene Billings,  RN 01/27/2022 2:53 PM

## 2022-01-27 NOTE — Progress Notes (Signed)
Patients port flushed without difficulty.  Good blood return noted with no bruising or swelling noted at site.  Patient remains accessed for chemotherapy treatment.  

## 2022-01-28 ENCOUNTER — Encounter (HOSPITAL_COMMUNITY): Payer: Self-pay | Admitting: Hematology

## 2022-01-28 ENCOUNTER — Inpatient Hospital Stay (HOSPITAL_COMMUNITY): Payer: No Typology Code available for payment source

## 2022-01-28 VITALS — BP 114/66 | HR 67 | Temp 97.6°F | Resp 18 | Wt 197.2 lb

## 2022-01-28 DIAGNOSIS — C8510 Unspecified B-cell lymphoma, unspecified site: Secondary | ICD-10-CM

## 2022-01-28 DIAGNOSIS — Z5111 Encounter for antineoplastic chemotherapy: Secondary | ICD-10-CM | POA: Diagnosis not present

## 2022-01-28 MED ORDER — HEPARIN SOD (PORK) LOCK FLUSH 100 UNIT/ML IV SOLN: 500.0000 [IU] | Freq: Once | INTRAVENOUS | Status: DC | PRN

## 2022-01-28 MED ORDER — PROCHLORPERAZINE MALEATE 10 MG PO TABS
10.0000 mg | ORAL_TABLET | Freq: Once | ORAL | Status: AC
  Administered 2022-01-28: 10 mg via ORAL
  Filled 2022-01-28: qty 1

## 2022-01-28 MED ORDER — SODIUM CHLORIDE 0.9 % IV SOLN: Freq: Once | INTRAVENOUS | Status: AC

## 2022-01-28 MED ORDER — SODIUM CHLORIDE 0.9 % IV SOLN
50.0000 mg/m2 | Freq: Once | INTRAVENOUS | Status: AC
  Administered 2022-01-28: 110 mg via INTRAVENOUS
  Filled 2022-01-28: qty 5.5

## 2022-01-28 MED ORDER — SODIUM CHLORIDE 0.9% FLUSH: 10.0000 mL | INTRAVENOUS | Status: DC | PRN

## 2022-01-28 NOTE — Progress Notes (Signed)
Patient presents today for chemotherapy infusion.  Patient is in satisfactory condition with no complaints voiced.  Vital signs are stable.  Labs from 01/27/2022 reviewed and all labs are within treatment parameters.  We will proceed with treatment per MD orders.   Patient tolerated treatment well with no complaints voiced.  Patient left ambulatory in stable condition.  Vital signs stable at discharge.  Follow up as scheduled.

## 2022-01-28 NOTE — Patient Instructions (Signed)
[facility]  Discharge Instructions: Thank you for choosing [facility] to provide your oncology and hematology care.  If you have a lab appointment with the Cancer Center, please come in thru the Main Entrance and check in at the main information desk.  Wear comfortable clothing and clothing appropriate for easy access to any Portacath or PICC line.   We strive to give you quality time with your provider. You may need to reschedule your appointment if you arrive late (15 or more minutes).  Arriving late affects you and other patients whose appointments are after yours.  Also, if you miss three or more appointments without notifying the office, you may be dismissed from the clinic at the provider's discretion.      For prescription refill requests, have your pharmacy contact our office and allow 72 hours for refills to be completed.    Today you received the following chemotherapy and/or immunotherapy agents Etoposide.  Etoposide, VP-16 injection What is this medication? ETOPOSIDE, VP-16 (e toe POE side) is a chemotherapy drug. It is used to treat testicular cancer, lung cancer, and other cancers. This medicine may be used for other purposes; ask your health care provider or pharmacist if you have questions. COMMON BRAND NAME(S): Etopophos, Toposar, VePesid What should I tell my care team before I take this medication? They need to know if you have any of these conditions: infection kidney disease liver disease low blood counts, like low white cell, platelet, or red cell counts an unusual or allergic reaction to etoposide, other medicines, foods, dyes, or preservatives pregnant or trying to get pregnant breast-feeding How should I use this medication? This medicine is for infusion into a vein. It is administered in a hospital or clinic by a specially trained health care professional. Talk to your pediatrician regarding the use of this medicine in children.  Special care may be needed. Overdosage: If you think you have taken too much of this medicine contact a poison control center or emergency room at once. NOTE: This medicine is only for you. Do not share this medicine with others. What if I miss a dose? It is important not to miss your dose. Call your doctor or health care professional if you are unable to keep an appointment. What may interact with this medication? This medicine may interact with the following medications: warfarin This list may not describe all possible interactions. Give your health care provider a list of all the medicines, herbs, non-prescription drugs, or dietary supplements you use. Also tell them if you smoke, drink alcohol, or use illegal drugs. Some items may interact with your medicine. What should I watch for while using this medication? Visit your doctor for checks on your progress. This drug may make you feel generally unwell. This is not uncommon, as chemotherapy can affect healthy cells as well as cancer cells. Report any side effects. Continue your course of treatment even though you feel ill unless your doctor tells you to stop. In some cases, you may be given additional medicines to help with side effects. Follow all directions for their use. Call your doctor or health care professional for advice if you get a fever, chills or sore throat, or other symptoms of a cold or flu. Do not treat yourself. This drug decreases your body's ability to fight infections. Try to avoid being around people who are sick. This medicine may increase your risk to bruise or bleed. Call your doctor or health care professional if   you notice any unusual bleeding. Talk to your doctor about your risk of cancer. You may be more at risk for certain types of cancers if you take this medicine. Do not become pregnant while taking this medicine or for at least 6 months after stopping it. Women should inform their doctor if they wish to become  pregnant or think they might be pregnant. Women of child-bearing potential will need to have a negative pregnancy test before starting this medicine. There is a potential for serious side effects to an unborn child. Talk to your health care professional or pharmacist for more information. Do not breast-feed an infant while taking this medicine. Men must use a latex condom during sexual contact with a woman while taking this medicine and for at least 4 months after stopping it. A latex condom is needed even if you have had a vasectomy. Contact your doctor right away if your partner becomes pregnant. Do not donate sperm while taking this medicine and for at least 4 months after you stop taking this medicine. Men should inform their doctors if they wish to father a child. This medicine may lower sperm counts. What side effects may I notice from receiving this medication? Side effects that you should report to your doctor or health care professional as soon as possible: allergic reactions like skin rash, itching or hives, swelling of the face, lips, or tongue low blood counts - this medicine may decrease the number of white blood cells, red blood cells, and platelets. You may be at increased risk for infections and bleeding nausea, vomiting redness, blistering, peeling or loosening of the skin, including inside the mouth signs and symptoms of infection like fever; chills; cough; sore throat; pain or trouble passing urine signs and symptoms of low red blood cells or anemia such as unusually weak or tired; feeling faint or lightheaded; falls; breathing problems unusual bruising or bleeding Side effects that usually do not require medical attention (report to your doctor or health care professional if they continue or are bothersome): changes in taste diarrhea hair loss loss of appetite mouth sores This list may not describe all possible side effects. Call your doctor for medical advice about side effects.  You may report side effects to FDA at 1-800-FDA-1088. Where should I keep my medication? This drug is given in a hospital or clinic and will not be stored at home. NOTE: This sheet is a summary. It may not cover all possible information. If you have questions about this medicine, talk to your doctor, pharmacist, or health care provider.  2023 Elsevier/Gold Standard (2021-06-27 00:00:00)       To help prevent nausea and vomiting after your treatment, we encourage you to take your nausea medication as directed.  BELOW ARE SYMPTOMS THAT SHOULD BE REPORTED IMMEDIATELY: *FEVER GREATER THAN 100.4 F (38 C) OR HIGHER *CHILLS OR SWEATING *NAUSEA AND VOMITING THAT IS NOT CONTROLLED WITH YOUR NAUSEA MEDICATION *UNUSUAL SHORTNESS OF BREATH *UNUSUAL BRUISING OR BLEEDING *URINARY PROBLEMS (pain or burning when urinating, or frequent urination) *BOWEL PROBLEMS (unusual diarrhea, constipation, pain near the anus) TENDERNESS IN MOUTH AND THROAT WITH OR WITHOUT PRESENCE OF ULCERS (sore throat, sores in mouth, or a toothache) UNUSUAL RASH, SWELLING OR PAIN  UNUSUAL VAGINAL DISCHARGE OR ITCHING   Items with * indicate a potential emergency and should be followed up as soon as possible or go to the Emergency Department if any problems should occur.  Please show the CHEMOTHERAPY ALERT CARD or IMMUNOTHERAPY ALERT CARD at   check-in to the Emergency Department and triage nurse.  Should you have questions after your visit or need to cancel or reschedule your appointment, please contact [facility] [phone]  and follow the prompts.  Office hours are 8:00 a.m. to 4:30 p.m. Monday - Friday. Please note that voicemails left after 4:00 p.m. may not be returned until the following business day.  We are closed weekends and major holidays. You have access to a nurse at all times for urgent questions. Please call the main number to the clinic [phone] and follow the prompts.  For any non-urgent  questions, you may also contact your provider using MyChart. We now offer e-Visits for anyone 18 and older to request care online for non-urgent symptoms. For details visit [email or web address].   Also download the MyChart app! Go to the app store, search "MyChart", open the app, select [facility], and log in with your MyChart username and password.  Masks are optional in the cancer centers. If you would like for your care team to wear a mask while they are taking care of you, please let them know. For doctor visits, patients may have with them one support person who is at least 77 years old. At this time, visitors are not allowed in the infusion area.  

## 2022-01-29 ENCOUNTER — Inpatient Hospital Stay (HOSPITAL_COMMUNITY): Payer: No Typology Code available for payment source

## 2022-01-29 VITALS — BP 119/81 | HR 64 | Temp 97.8°F | Resp 18

## 2022-01-29 DIAGNOSIS — Z5111 Encounter for antineoplastic chemotherapy: Secondary | ICD-10-CM | POA: Diagnosis not present

## 2022-01-29 DIAGNOSIS — C8510 Unspecified B-cell lymphoma, unspecified site: Secondary | ICD-10-CM

## 2022-01-29 MED ORDER — SODIUM CHLORIDE 0.9 % IV SOLN: Freq: Once | INTRAVENOUS | Status: AC

## 2022-01-29 MED ORDER — PALONOSETRON HCL INJECTION 0.25 MG/5ML
0.2500 mg | Freq: Once | INTRAVENOUS | Status: AC
  Administered 2022-01-29: 0.25 mg via INTRAVENOUS
  Filled 2022-01-29: qty 5

## 2022-01-29 MED ORDER — SODIUM CHLORIDE 0.9% FLUSH
10.0000 mL | INTRAVENOUS | Status: DC | PRN
  Administered 2022-01-29 (×2): 10 mL

## 2022-01-29 MED ORDER — HEPARIN SOD (PORK) LOCK FLUSH 100 UNIT/ML IV SOLN
500.0000 [IU] | Freq: Once | INTRAVENOUS | Status: AC | PRN
  Administered 2022-01-29: 500 [IU]

## 2022-01-29 MED ORDER — SODIUM CHLORIDE 0.9 % IV SOLN
50.0000 mg/m2 | Freq: Once | INTRAVENOUS | Status: AC
  Administered 2022-01-29: 110 mg via INTRAVENOUS
  Filled 2022-01-29: qty 5.5

## 2022-01-29 NOTE — Patient Instructions (Signed)
[name] [name]  Discharge Instructions: Thank you for choosing [facility] to provide your oncology and hematology care.  If you have a lab appointment with the [facility], please come in thru the Main Entrance and check in at the main information desk.  Wear comfortable clothing and clothing appropriate for easy access to any Portacath or PICC line.   We strive to give you quality time with your provider. You may need to reschedule your appointment if you arrive late (15 or more minutes).  Arriving late affects you and other patients whose appointments are after yours.  Also, if you miss three or more appointments without notifying the office, you may be dismissed from the clinic at the provider's discretion.      For prescription refill requests, have your pharmacy contact our office and allow 72 hours for refills to be completed.    Today you received the following chemotherapy and/or immunotherapy agents D3 Etoposide   To help prevent nausea and vomiting after your treatment, we encourage you to take your nausea medication as directed.  BELOW ARE SYMPTOMS THAT SHOULD BE REPORTED IMMEDIATELY: *FEVER GREATER THAN 100.4 F (38 C) OR HIGHER *CHILLS OR SWEATING *NAUSEA AND VOMITING THAT IS NOT CONTROLLED WITH YOUR NAUSEA MEDICATION *UNUSUAL SHORTNESS OF BREATH *UNUSUAL BRUISING OR BLEEDING *URINARY PROBLEMS (pain or burning when urinating, or frequent urination) *BOWEL PROBLEMS (unusual diarrhea, constipation, pain near the anus) TENDERNESS IN MOUTH AND THROAT WITH OR WITHOUT PRESENCE OF ULCERS (sore throat, sores in mouth, or a toothache) UNUSUAL RASH, SWELLING OR PAIN  UNUSUAL VAGINAL DISCHARGE OR ITCHING   Items with * indicate a potential emergency and should be followed up as soon as possible or go to the Emergency Department if any problems should occur.  Please show the CHEMOTHERAPY ALERT CARD or IMMUNOTHERAPY ALERT CARD at check-in to the Emergency  Department and triage nurse.  Should you have questions after your visit or need to cancel or reschedule your appointment, please contact [facility] [phone]  and follow the prompts.  Office hours are 8:00 a.m. to 4:30 p.m. Monday - Friday. Please note that voicemails left after 4:00 p.m. may not be returned until the following business day.  We are closed weekends and major holidays. You have access to a nurse at all times for urgent questions. Please call the main number to the clinic [phone] and follow the prompts.  For any non-urgent questions, you may also contact your provider using MyChart. We now offer e-Visits for anyone 77 and older to request care online for non-urgent symptoms. For details visit [email or web address].   Also download the MyChart app! Go to the app store, search "MyChart", open the app, select [facility], and log in with your MyChart username and password.  Masks are optional in the cancer centers. If you would like for your care team to wear a mask while they are taking care of you, please let them know. For doctor visits, patients may have with them one support person who is at least 77 years old. At this time, visitors are not allowed in the infusion area.

## 2022-01-29 NOTE — Progress Notes (Signed)
Pt presents today for D3 Etoposide per provider's order. Vital signs stable and pot voiced no new complaints at this time.  D3 Etoposide given today per MD orders. Tolerated infusion without adverse affects. Vital signs stable. No complaints at this time. Discharged from clinic ambulatory in stable condition. Alert and oriented x 3. F/U with [facility] as scheduled.

## 2022-01-30 ENCOUNTER — Inpatient Hospital Stay (HOSPITAL_COMMUNITY): Payer: No Typology Code available for payment source

## 2022-01-30 VITALS — BP 115/66 | HR 61 | Temp 97.4°F | Resp 18

## 2022-01-30 DIAGNOSIS — Z5111 Encounter for antineoplastic chemotherapy: Secondary | ICD-10-CM | POA: Diagnosis not present

## 2022-01-30 DIAGNOSIS — C8510 Unspecified B-cell lymphoma, unspecified site: Secondary | ICD-10-CM

## 2022-01-30 MED ORDER — PEGFILGRASTIM-CBQV 6 MG/0.6ML ~~LOC~~ SOSY
6.0000 mg | PREFILLED_SYRINGE | Freq: Once | SUBCUTANEOUS | Status: AC
  Administered 2022-01-30: 6 mg via SUBCUTANEOUS
  Filled 2022-01-30: qty 0.6

## 2022-02-02 ENCOUNTER — Ambulatory Visit (INDEPENDENT_AMBULATORY_CARE_PROVIDER_SITE_OTHER): Payer: Medicare HMO | Admitting: *Deleted

## 2022-02-02 DIAGNOSIS — Z5181 Encounter for therapeutic drug level monitoring: Secondary | ICD-10-CM

## 2022-02-02 DIAGNOSIS — I240 Acute coronary thrombosis not resulting in myocardial infarction: Secondary | ICD-10-CM | POA: Diagnosis not present

## 2022-02-02 LAB — POCT INR: INR: 1.6 — AB (ref 2.0–3.0)

## 2022-02-03 ENCOUNTER — Encounter (HOSPITAL_COMMUNITY): Payer: Self-pay | Admitting: Hematology

## 2022-02-04 ENCOUNTER — Encounter: Payer: Self-pay | Admitting: Vascular Surgery

## 2022-02-04 ENCOUNTER — Ambulatory Visit: Payer: No Typology Code available for payment source | Admitting: Vascular Surgery

## 2022-02-04 VITALS — BP 99/62 | HR 60 | Temp 98.1°F | Resp 14 | Ht 67.0 in | Wt 187.6 lb

## 2022-02-04 DIAGNOSIS — I724 Aneurysm of artery of lower extremity: Secondary | ICD-10-CM

## 2022-02-04 NOTE — Progress Notes (Signed)
[facility]  Patient name: [name] [name] MRN: [id number] DOB: Oct 30, 1945 Sex: male  REASON FOR CONSULT: Evaluation popliteal artery aneurysm  HPI: [name] [name] is a 76 y.o. male, who is here today with his wife for evaluation of popliteal artery aneurysm.  He was being evaluated for discomfort and numbness in his right lateral calf and foot.  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  He recently was diagnosed with lymphoma during a work-up for weight loss.  This was abdominal mass and he is started his treatment with chemotherapy.  He reports generalized weakness.  His right leg symptoms appear to be more neurogenic.  He does not have any claudication or rest pain type symptoms.  He has no history of lower extremity tissue loss.  Past Medical History:  Diagnosis Date   ALLERGIC RHINITIS 10/31/2007   Qualifier: Diagnosis of  By: Jenny Reichmann MD, Hunt Oris    Allergy    CHOLECYSTECTOMY, HX OF 06/04/2007   Qualifier: Diagnosis of  By: Danny Lawless CMA, [place]     COLONIC POLYPS, HX OF 10/31/2007   Qualifier: Diagnosis of  By: Jenny Reichmann MD, Hunt Oris    CORONARY ARTERY BYPASS GRAFT, HX OF 06/04/2007   Qualifier: Diagnosis of  By: [place], [place]     CORONARY ARTERY DISEASE 06/04/2007   Qualifier: Diagnosis of  By: Danny Lawless CMA, [place]     Diverticulosis    Erectile dysfunction    Eye twitch    right eye since chilhood    HYPERLIPIDEMIA 06/04/2007   Qualifier: Diagnosis of  By: [place], [place]     Hypersomnolence    HYPERTENSION 06/04/2007   Qualifier: Diagnosis of  By: [place], [place]     Hypothyroidism    ISCHEMIC CARDIOMYOPATHY 06/04/2007   Qualifier: Diagnosis of  By: Danny Lawless CMA, [place]     Myocardial infarction ([place])    per patient , his occurred in 1998    Osteoarthritis    PLMD (periodic limb  movement disorder)    Sleep apnea    no cpap' per patient , "i was checked for it and they said i didnt have it "    Vertigo    Vitamin D deficiency     Family History  Problem Relation Age of Onset   Stomach cancer Mother        smokes   Lung cancer Father        chewed tobacco   Heart disease Brother        first MI at 24yo, now 48 for transplant list/ ICM   Pancreatic cancer Brother    Colon cancer Maternal Grandfather    COPD Son        was a smoker   Pancreatic cancer Maternal Uncle    Pancreatic cancer Cousin        mat side x 4   Esophageal cancer Neg Hx    Prostate cancer Neg Hx    Rectal cancer Neg Hx     SOCIAL HISTORY: Social History   Socioeconomic History   Marital status: Married    Spouse name: [name]   Number of children: 3   Years of education: Not on file   Highest education level: Not on file  Occupational History   Occupation: truck Education administrator: [employer]  Tobacco Use   Smoking status:  Former    Packs/day: 4.00    Years: 17.00    Total pack years: 68.00    Types: Cigarettes    Quit date: 05/07/1981    Years since quitting: 40.7   Smokeless tobacco: Never   Tobacco comments:    Pt states that he would let most of them "burn" pt states that he used anywhere between 4-5PPD  Vaping Use   Vaping Use: Never used  Substance and Sexual Activity   Alcohol use: Yes    Alcohol/week: 0.0 standard drinks of alcohol    Comment: rare   Drug use: No   Sexual activity: Not on file  Other Topics Concern   Not on file  Social History Narrative   Lives in [place] with spouse   Retired Administrator   Social Determinants of Health   Financial Resource Strain: [place]  (10/27/2021)   Overall Financial Resource Strain (CARDIA)    Difficulty of Paying Living Expenses: Not hard at all  Food Insecurity: No Food Insecurity (10/27/2021)   Hunger Vital Sign    Worried About Running Out of Food in the Last Year: Never true    Ran Out of Food  in the Last Year: Never true  Transportation Needs: No Transportation Needs (10/27/2021)   PRAPARE - Hydrologist (Medical): No    Lack of Transportation (Non-Medical): No  Physical Activity: Inactive (10/27/2021)   Exercise Vital Sign    Days of Exercise per Week: 0 days    Minutes of Exercise per Session: 0 min  Stress: No Stress Concern Present (10/27/2021)   [place]    Feeling of Stress : Not at all  Social Connections: Moderately Isolated (10/27/2021)   Social Connection and Isolation Panel [NHANES]    Frequency of Communication with Friends and Family: More than three times a week    Frequency of Social Gatherings with Friends and Family: More than three times a week    Attends Religious Services: Never    Marine scientist or Organizations: No    Attends Archivist Meetings: Never    Marital Status: Married  Human resources officer Violence: Not At Risk (10/27/2021)   Humiliation, Afraid, Rape, and Kick questionnaire    Fear of Current or Ex-Partner: No    Emotionally Abused: No    Physically Abused: No    Sexually Abused: No    Allergies  Allergen Reactions   Ace Inhibitors Cough   Statins Other (See Comments)    Muscle pains    Current Outpatient Medications  Medication Sig Dispense Refill   ARTIFICIAL TEAR SOLUTION OP Place 1 drop into both eyes daily as needed (dry eyes).     Ascorbic Acid (VITA-C PO) Take 1,000 mg by mouth daily.     b complex vitamins capsule Take 1 capsule by mouth daily.     empagliflozin (JARDIANCE) 10 MG TABS tablet Take 10 mg by mouth daily.     ezetimibe (ZETIA) 10 MG tablet TAKE 1 TABLET EVERY DAY (Patient taking differently: Take by mouth every other day.) 90 tablet 3   feeding supplement (ENSURE ENLIVE / ENSURE PLUS) LIQD Take 237 mLs by mouth 3 (three) times daily between meals. 237 mL 12   furosemide (LASIX) 20 MG tablet Take 1 tablet  (20 mg total) by mouth daily. 90 tablet 3   lidocaine-prilocaine (EMLA) cream Apply a small amount to port a cath site (do  not rub in) and cover with plastic wrap 1 hour prior to infusion appointments 30 g 3   metoprolol succinate (TOPROL-XL) 50 MG 24 hr tablet TAKE 1 TABLET DAILY. TAKE WITH OR IMMEDIATELY FOLLOWING A MEAL. 90 tablet 1   Multiple Vitamins-Minerals (MULTIVITAMIN WITH MINERALS) tablet Take 1 tablet by mouth daily. Centrum silver     rosuvastatin (CRESTOR) 5 MG tablet TAKE 1 TABLET EVERY OTHER DAY 45 tablet 10   triamcinolone (NASACORT) 55 MCG/ACT AERO nasal inhaler Place 2 sprays into the nose daily. (Patient taking differently: Place 2 sprays into the nose daily as needed (allergies).) 3 Inhaler 3   valsartan (DIOVAN) 40 MG tablet Take 1 tablet (40 mg total) by mouth daily. 30 tablet 1   VITAMIN D PO Take 1,000 Units by mouth daily.     warfarin (COUMADIN) 5 MG tablet TAKE 1 TABLET EVERY DAY AT 4 PM (Patient taking differently: Take 2.5-5 mg by mouth See admin instructions. Taking 5 mg every day except on Friday taking 1/2 tablet = 2.5 mg) 90 tablet 4   allopurinol (ZYLOPRIM) 300 MG tablet Take 1 tablet (300 mg total) by mouth daily. (Patient not taking: Reported on 02/04/2022) 30 tablet 3   cefdinir (OMNICEF) 300 MG capsule Take 1 capsule (300 mg total) by mouth every 12 (twelve) hours. (Patient not taking: Reported on 02/04/2022) 10 capsule 0   prochlorperazine (COMPAZINE) 10 MG tablet Take 1 tablet (10 mg total) by mouth every 6 (six) hours as needed for nausea or vomiting. (Patient not taking: Reported on 02/04/2022) 30 tablet 3   spironolactone (ALDACTONE) 25 MG tablet Take 0.5 tablets (12.5 mg total) by mouth daily. (Patient taking differently: Take 25 mg by mouth daily.) 45 tablet 3   No current facility-administered medications for this visit.    REVIEW OF SYSTEMS:  '[X]'$  denotes positive finding, '[ ]'$  denotes negative finding Cardiac  Comments:  Chest pain or chest pressure:     Shortness of breath upon exertion:    Short of breath when lying flat:    Irregular heart rhythm:        Vascular    Pain in calf, thigh, or hip brought on by ambulation: x   Pain in feet at night that wakes you up from your sleep:  x   Blood clot in your veins: x   Leg swelling:         Pulmonary    Oxygen at home:    Productive cough:     Wheezing:         Neurologic    Sudden weakness in arms or legs:     Sudden numbness in arms or legs:     Sudden onset of difficulty speaking or slurred speech:    Temporary loss of vision in one eye:     Problems with dizziness:         Gastrointestinal    Blood in stool:     Vomited blood:         Genitourinary    Burning when urinating:     Blood in urine:        Psychiatric    Major depression:         Hematologic    Bleeding problems:    Problems with blood clotting too easily:        Skin    Rashes or ulcers:        Constitutional    Fever or chills:      PHYSICAL  EXAM: Vitals:   02/04/22 0825  BP: 99/62  Pulse: 60  Resp: 14  Temp: 98.1 F (36.7 C)  TempSrc: Temporal  SpO2: 92%  Weight: 187 lb 9.6 oz (85.1 kg)  Height: '5\' 7"'$  (1.702 m)    GENERAL: The patient is a well-nourished male, in no acute distress. The vital signs are documented above. CARDIOVASCULAR: 2+ radial 2+ femoral pulses.  Easily palpable right popliteal aneurysm.  His aneurysm is nontender normal left popliteal pulse.  I do not palpate pedal pulses PULMONARY: There is good air exchange  MUSCULOSKELETAL: There are no major deformities or cyanosis. NEUROLOGIC: No focal weakness or paresthesias are detected. SKIN: There are no ulcers or rashes noted. PSYCHIATRIC: The patient has a normal affect.  DATA:  Duplex reveals 4.4 cm right popliteal artery aneurysm.  CT scan from March for evaluation of weight loss revealed no evidence of occlusive disease or aneurysmal disease and is aortic, iliac or femoral arteries.  MEDICAL ISSUES: 4.4 cm  popliteal artery aneurysm.  I explained that to be quite uncommon for his discomfort to be related to his popliteal artery.  This appears to be more neurogenic.  I did explain the potential risk of popliteal artery aneurysm with the main risk of thrombosis and limb loss.  I did explain options for treatment to include surgical exclusion of the popliteal artery aneurysm and above-knee to below-knee bypass.  Also explained the possible treatment with stent graft.  I have recommended arteriography for further evaluation and treatment planning.  Also discussed we would need to coordinate this around his lymphoma treatment.  Explained that this is not urgent.  We will obtain CT scan and then discuss this with him further.   Rosetta Posner, MD FACS [facility] [phone] Pager [phone]  Note: Portions of this report may have been transcribed using voice recognition software.  Every effort has been made to ensure accuracy; however, inadvertent computerized transcription errors may still be present.

## 2022-02-05 ENCOUNTER — Encounter (HOSPITAL_COMMUNITY)
Admission: RE | Admit: 2022-02-05 | Discharge: 2022-02-05 | Disposition: A | Discharge status: Home / Self Care | Payer: No Typology Code available for payment source | Source: Ambulatory Visit | Attending: Hematology | Admitting: Hematology

## 2022-02-05 ENCOUNTER — Telehealth (HOSPITAL_COMMUNITY): Payer: Self-pay

## 2022-02-05 DIAGNOSIS — I700 Atherosclerosis of aorta: Secondary | ICD-10-CM | POA: Diagnosis not present

## 2022-02-05 DIAGNOSIS — I6523 Occlusion and stenosis of bilateral carotid arteries: Secondary | ICD-10-CM | POA: Diagnosis not present

## 2022-02-05 DIAGNOSIS — C8510 Unspecified B-cell lymphoma, unspecified site: Secondary | ICD-10-CM | POA: Diagnosis present

## 2022-02-05 DIAGNOSIS — C8330 Diffuse large B-cell lymphoma, unspecified site: Secondary | ICD-10-CM | POA: Diagnosis not present

## 2022-02-05 DIAGNOSIS — I2510 Atherosclerotic heart disease of native coronary artery without angina pectoris: Secondary | ICD-10-CM | POA: Diagnosis not present

## 2022-02-05 MED ORDER — FLUDEOXYGLUCOSE F - 18 (FDG) INJECTION
9.7400 | Freq: Once | INTRAVENOUS | Status: AC
  Administered 2022-02-05: 9.74 via INTRAVENOUS

## 2022-02-05 NOTE — Telephone Encounter (Signed)
error 

## 2022-02-05 NOTE — Telephone Encounter (Signed)
Patient called after hours triage line. Returned call to patient today. Patient has symptoms of fatigue only. Patient denies diarrhea, shortness of breath, nausea and decreased appetite. Patient teaching performed pertaining to fatigue being a normal side effect after treatment. Signs and symptoms discussed with patient pertaining to normal side effects of treatment. Understanding verbalized. Patient states he is eating and drinking and taking ENSURE in with meals. Patient encouraged to call clinic with any additional side effects or concerns.

## 2022-02-06 ENCOUNTER — Other Ambulatory Visit: Payer: Self-pay

## 2022-02-06 DIAGNOSIS — I724 Aneurysm of artery of lower extremity: Secondary | ICD-10-CM

## 2022-02-11 NOTE — Progress Notes (Signed)
Remote ICD transmission.   

## 2022-02-16 ENCOUNTER — Ambulatory Visit (INDEPENDENT_AMBULATORY_CARE_PROVIDER_SITE_OTHER): Payer: Medicare HMO | Admitting: *Deleted

## 2022-02-16 DIAGNOSIS — Z5181 Encounter for therapeutic drug level monitoring: Secondary | ICD-10-CM | POA: Diagnosis not present

## 2022-02-16 DIAGNOSIS — I240 Acute coronary thrombosis not resulting in myocardial infarction: Secondary | ICD-10-CM

## 2022-02-16 LAB — POCT INR: INR: 1.6 — AB (ref 2.0–3.0)

## 2022-02-16 NOTE — Patient Instructions (Signed)
Pt started chemo 12/15/2021 Pt takes Prednisone 100 mg on the first 5 days of chemo treatment.  Increase warfarin to 1 tablet daily except 1/2 tablet on Fridays Recheck in 2 wks.

## 2022-02-17 ENCOUNTER — Inpatient Hospital Stay (HOSPITAL_COMMUNITY): Payer: No Typology Code available for payment source

## 2022-02-17 ENCOUNTER — Inpatient Hospital Stay (HOSPITAL_BASED_OUTPATIENT_CLINIC_OR_DEPARTMENT_OTHER): Payer: No Typology Code available for payment source | Admitting: Hematology

## 2022-02-17 ENCOUNTER — Encounter: Payer: Self-pay | Admitting: Cardiology

## 2022-02-17 ENCOUNTER — Inpatient Hospital Stay (HOSPITAL_COMMUNITY): Payer: No Typology Code available for payment source | Attending: Hematology

## 2022-02-17 VITALS — BP 102/62 | HR 75 | Temp 97.5°F | Resp 18

## 2022-02-17 DIAGNOSIS — I509 Heart failure, unspecified: Secondary | ICD-10-CM | POA: Diagnosis not present

## 2022-02-17 DIAGNOSIS — C8510 Unspecified B-cell lymphoma, unspecified site: Secondary | ICD-10-CM | POA: Diagnosis not present

## 2022-02-17 DIAGNOSIS — Z5189 Encounter for other specified aftercare: Secondary | ICD-10-CM | POA: Diagnosis not present

## 2022-02-17 DIAGNOSIS — I110 Hypertensive heart disease with heart failure: Secondary | ICD-10-CM | POA: Diagnosis not present

## 2022-02-17 DIAGNOSIS — I255 Ischemic cardiomyopathy: Secondary | ICD-10-CM | POA: Diagnosis not present

## 2022-02-17 DIAGNOSIS — Z5111 Encounter for antineoplastic chemotherapy: Secondary | ICD-10-CM | POA: Diagnosis present

## 2022-02-17 DIAGNOSIS — C8339 Diffuse large B-cell lymphoma, extranodal and solid organ sites: Secondary | ICD-10-CM | POA: Insufficient documentation

## 2022-02-17 DIAGNOSIS — Z5112 Encounter for antineoplastic immunotherapy: Secondary | ICD-10-CM | POA: Insufficient documentation

## 2022-02-17 DIAGNOSIS — Z801 Family history of malignant neoplasm of trachea, bronchus and lung: Secondary | ICD-10-CM | POA: Insufficient documentation

## 2022-02-17 DIAGNOSIS — Z800 Family history of malignant neoplasm of digestive organs: Secondary | ICD-10-CM | POA: Insufficient documentation

## 2022-02-17 DIAGNOSIS — Z7901 Long term (current) use of anticoagulants: Secondary | ICD-10-CM | POA: Insufficient documentation

## 2022-02-17 DIAGNOSIS — Z87891 Personal history of nicotine dependence: Secondary | ICD-10-CM | POA: Diagnosis not present

## 2022-02-17 DIAGNOSIS — R7989 Other specified abnormal findings of blood chemistry: Secondary | ICD-10-CM

## 2022-02-17 LAB — COMPREHENSIVE METABOLIC PANEL
ALT: 22 U/L (ref 0–44)
AST: 25 U/L (ref 15–41)
Albumin: 3.6 g/dL (ref 3.5–5.0)
Alkaline Phosphatase: 67 U/L (ref 38–126)
Anion gap: 7 (ref 5–15)
BUN: 38 mg/dL — ABNORMAL HIGH (ref 8–23)
CO2: 25 mmol/L (ref 22–32)
Calcium: 8.8 mg/dL — ABNORMAL LOW (ref 8.9–10.3)
Chloride: 103 mmol/L (ref 98–111)
Creatinine, Ser: 1.61 mg/dL — ABNORMAL HIGH (ref 0.61–1.24)
GFR, Estimated: 44 mL/min — ABNORMAL LOW (ref >60)
Glucose, Bld: 135 mg/dL — ABNORMAL HIGH (ref 70–99)
Potassium: 4.5 mmol/L (ref 3.5–5.1)
Sodium: 135 mmol/L (ref 135–145)
Total Bilirubin: 1 mg/dL (ref 0.3–1.2)
Total Protein: 6.4 g/dL — ABNORMAL LOW (ref 6.5–8.1)

## 2022-02-17 LAB — CBC WITH DIFFERENTIAL/PLATELET
Abs Immature Granulocytes: 0.15 10*3/uL — ABNORMAL HIGH (ref 0.00–0.07)
Basophils Absolute: 0.1 10*3/uL (ref 0.0–0.1)
Basophils Relative: 1 %
Eosinophils Absolute: 0.1 10*3/uL (ref 0.0–0.5)
Eosinophils Relative: 1 %
HCT: 35.5 % — ABNORMAL LOW (ref 39.0–52.0)
Hemoglobin: 11.4 g/dL — ABNORMAL LOW (ref 13.0–17.0)
Immature Granulocytes: 2 %
Lymphocytes Relative: 24 %
Lymphs Abs: 2.4 10*3/uL (ref 0.7–4.0)
MCH: 31.8 pg (ref 26.0–34.0)
MCHC: 32.1 g/dL (ref 30.0–36.0)
MCV: 98.9 fL (ref 80.0–100.0)
Monocytes Absolute: 1.1 10*3/uL — ABNORMAL HIGH (ref 0.1–1.0)
Monocytes Relative: 11 %
Neutro Abs: 6 10*3/uL (ref 1.7–7.7)
Neutrophils Relative %: 61 %
Platelets: 162 10*3/uL (ref 150–400)
RBC: 3.59 MIL/uL — ABNORMAL LOW (ref 4.22–5.81)
RDW: 17.7 % — ABNORMAL HIGH (ref 11.5–15.5)
WBC: 9.9 10*3/uL (ref 4.0–10.5)
nRBC: 0 % (ref 0.0–0.2)

## 2022-02-17 LAB — LACTATE DEHYDROGENASE: LDH: 147 U/L (ref 98–192)

## 2022-02-17 LAB — MAGNESIUM: Magnesium: 2.3 mg/dL (ref 1.7–2.4)

## 2022-02-17 LAB — URIC ACID: Uric Acid, Serum: 7.6 mg/dL (ref 3.7–8.6)

## 2022-02-17 MED ORDER — SODIUM CHLORIDE 0.9 % IV SOLN: Freq: Once | INTRAVENOUS | Status: AC

## 2022-02-17 MED ORDER — FAMOTIDINE IN NACL 20-0.9 MG/50ML-% IV SOLN
20.0000 mg | Freq: Once | INTRAVENOUS | Status: AC
  Administered 2022-02-17: 20 mg via INTRAVENOUS
  Filled 2022-02-17: qty 50

## 2022-02-17 MED ORDER — SODIUM CHLORIDE 0.9% FLUSH
10.0000 mL | INTRAVENOUS | Status: DC | PRN
  Administered 2022-02-17: 10 mL

## 2022-02-17 MED ORDER — SODIUM CHLORIDE 0.9 % IV SOLN: INTRAVENOUS | Status: DC

## 2022-02-17 MED ORDER — ACETAMINOPHEN 325 MG PO TABS
650.0000 mg | ORAL_TABLET | Freq: Once | ORAL | Status: AC
  Administered 2022-02-17: 650 mg via ORAL
  Filled 2022-02-17: qty 2

## 2022-02-17 MED ORDER — HEPARIN SOD (PORK) LOCK FLUSH 100 UNIT/ML IV SOLN
500.0000 [IU] | Freq: Once | INTRAVENOUS | Status: AC | PRN
  Administered 2022-02-17: 500 [IU]

## 2022-02-17 MED ORDER — SODIUM CHLORIDE 0.9 % IV SOLN
10.0000 mg | Freq: Once | INTRAVENOUS | Status: AC
  Administered 2022-02-17: 10 mg via INTRAVENOUS
  Filled 2022-02-17: qty 10

## 2022-02-17 MED ORDER — SODIUM CHLORIDE 0.9 % IV SOLN
600.0000 mg/m2 | Freq: Once | INTRAVENOUS | Status: AC
  Administered 2022-02-17: 1300 mg via INTRAVENOUS
  Filled 2022-02-17: qty 65

## 2022-02-17 MED ORDER — DIPHENHYDRAMINE HCL 25 MG PO CAPS
50.0000 mg | ORAL_CAPSULE | Freq: Once | ORAL | Status: AC
  Administered 2022-02-17: 50 mg via ORAL
  Filled 2022-02-17: qty 2

## 2022-02-17 MED ORDER — SODIUM CHLORIDE 0.9 % IV SOLN
40.0000 mg/m2 | Freq: Once | INTRAVENOUS | Status: AC
  Administered 2022-02-17: 90 mg via INTRAVENOUS
  Filled 2022-02-17: qty 4.5

## 2022-02-17 MED ORDER — PALONOSETRON HCL INJECTION 0.25 MG/5ML
0.2500 mg | Freq: Once | INTRAVENOUS | Status: AC
  Administered 2022-02-17: 0.25 mg via INTRAVENOUS
  Filled 2022-02-17: qty 5

## 2022-02-17 MED ORDER — SODIUM CHLORIDE 0.9 % IV SOLN
375.0000 mg/m2 | Freq: Once | INTRAVENOUS | Status: AC
  Administered 2022-02-17: 800 mg via INTRAVENOUS
  Filled 2022-02-17: qty 50

## 2022-02-17 MED ORDER — VINCRISTINE SULFATE CHEMO INJECTION 1 MG/ML
1.0000 mg | Freq: Once | INTRAVENOUS | Status: AC
  Administered 2022-02-17: 1 mg via INTRAVENOUS
  Filled 2022-02-17: qty 1

## 2022-02-17 NOTE — Telephone Encounter (Signed)
Patient stated that when in the hospital with [facility] in June, his valsartan was reduced to '40mg'$ . For the past 3 weeks, he has had leg weakness and fell. He has bruising on his rt inner leg below the knee. He did not hit his head. Recommended for patient to use his cane when ambulating. He also stated that he has exertional sob. Today, Dr. Delton Coombes discontinued valsartan. Appointment made with Thomasene Mohair for 7/14.

## 2022-02-17 NOTE — Progress Notes (Signed)
Cardiology Clinic Note   Patient Name: [name] [name] Date of Encounter: 02/20/2022  Primary Care Provider:  Biagio Borg, MD Primary Cardiologist:  Kirk Ruths, MD  Patient Profile    [name] [name] 76 year old male presents the clinic today for evaluation of his essential hypertension.  Past Medical History    Past Medical History:  Diagnosis Date   ALLERGIC RHINITIS 10/31/2007   Qualifier: Diagnosis of  By: Jenny Reichmann MD, Hunt Oris    Allergy    CHOLECYSTECTOMY, HX OF 06/04/2007   Qualifier: Diagnosis of  By: Danny Lawless CMA, [place]     COLONIC POLYPS, HX OF 10/31/2007   Qualifier: Diagnosis of  By: Jenny Reichmann MD, Hayden GRAFT, HX OF 06/04/2007   Qualifier: Diagnosis of  By: [place], [place]     CORONARY ARTERY DISEASE 06/04/2007   Qualifier: Diagnosis of  By: Danny Lawless CMA, [place]     Diverticulosis    Erectile dysfunction    Eye twitch    right eye since chilhood    HYPERLIPIDEMIA 06/04/2007   Qualifier: Diagnosis of  By: [place], [place]     Hypersomnolence    HYPERTENSION 06/04/2007   Qualifier: Diagnosis of  By: [place], [place]     Hypothyroidism    ISCHEMIC CARDIOMYOPATHY 06/04/2007   Qualifier: Diagnosis of  By: Danny Lawless CMA, [place]     Myocardial infarction ([place])    per patient , his occurred in 1998    Osteoarthritis    PLMD (periodic limb movement disorder)    Sleep apnea    no cpap' per patient , "i was checked for it and they said i didnt have it "    Vertigo    Vitamin D deficiency    Past Surgical History:  Procedure Laterality Date   BIOPSY  11/25/2021   Procedure: BIOPSY;  Surgeon: Jackquline Denmark, MD;  Location: [facility];  Service: Gastroenterology;;   CHOLECYSTECTOMY     COLONOSCOPY     COLONOSCOPY WITH PROPOFOL N/A 11/25/2021   Procedure: COLONOSCOPY WITH PROPOFOL;  Surgeon: Jackquline Denmark, MD;  Location: [facility];  Service: Gastroenterology;  Laterality: N/A;   CORONARY ARTERY BYPASS GRAFT      ESOPHAGOGASTRODUODENOSCOPY (EGD) WITH PROPOFOL N/A 11/25/2021   Procedure: ESOPHAGOGASTRODUODENOSCOPY (EGD) WITH PROPOFOL;  Surgeon: Jackquline Denmark, MD;  Location: [facility];  Service: Gastroenterology;  Laterality: N/A;   ICD IMPLANT N/A 01/30/2020   Procedure: ICD IMPLANT;  Surgeon: Thompson Grayer, MD;  Location: [facility];  Service: Cardiovascular;  Laterality: N/A;   IR IMAGING GUIDED PORT INSERTION  12/04/2021   KNEE ARTHROPLASTY Left 08/05/2017   Procedure: LEFT TOTAL KNEE ARTHROPLASTY WITH COMPUTER NAVIGATION;  Surgeon: Rod Can, MD;  Location: [facility];  Service: Orthopedics;  Laterality: Left;  Needs RNFA   PENILE PROSTHESIS IMPLANT     POLYPECTOMY  11/25/2021   Procedure: POLYPECTOMY;  Surgeon: Jackquline Denmark, MD;  Location: [facility];  Service: Gastroenterology;;   REPLACEMENT TOTAL KNEE  06/12/2013   spinal cyst     THORACOTOMY     left anterior; wound exploration and debridement   TONSILLECTOMY AND ADENOIDECTOMY     age 51    Allergies  Allergies  Allergen Reactions   Ace Inhibitors Cough   Statins Other (See Comments)    Muscle pains    History of Present Illness    [name] [name] has a PMH of essential hypertension, coronary atherosclerosis, ischemic cardiomyopathy, hyperlipidemia, cholecystectomy, CABG, vitamin D  deficiency, and right leg paresthesia.  Nuclear stress test 6/22 showed ejection fraction 29% and no evidence of infarct but no ischemia.   He had his first myocardial infarction in 1998.  He underwent cardiac catheterization and received nonrelated basically not to his LAD.  His nuclear stress test 5/16 showed an EF of 40%.  He was noted to have prior anterior apical infarct but no ischemia.  His echocardiogram 6/20 showed an EF of 30-35% with possible LV apical thrombus.  He underwent cardiac MRI which showed mild left atrial enlargement, mild mitral regurgitation, trace aortic insufficiency.  His cardiac MRI 7/20 showed an EF of 30% and  apical thrombus.  Mild aortic and mitral regurgitation.  A follow-up echocardiogram on medical therapy 11/20 showed an EF of 30-35%, moderate left ventricular enlargement, biatrial enlargement, mild mitral tricuspid and aortic insufficiency with mild aortic stenosis.  A nuclear stress test 1/21 showed an EF of 32% and prior infarct but no ischemia.  He had a ICD placed 6/21.   He was last seen by Dr. [name] on 06/17/2020.  During that time he noted some shortness of breath with more vigorous activities but not with routine activities.  He denied orthopnea and PND.  He had occasional mild edema in his feet.  He denied chest pain.   He contacted nurse triage line on 12/17/2020 and reported that he was having pressure in his chest while walking with his son.  He noted significantly reduced endurance.  He felt weak in his legs and noted increased shortness of breath.  His blood pressure was 147/84 with a heart rate of 61.  He denied lower extremity swelling but did note his pants were fitting tighter.  At the time of the call he was not feeling any chest pressure.   He presented to the clinic 12/18/20 for follow-up evaluation and stated Sunday after dinner he was walking to his son's truck and noted increased shortness of breath.  His symptoms continued on Sunday with shortness of breath and lower extremity weakness.  He reported that 5/11 his symptoms were somewhat improved.  He denied sick contacts and recent illness.  He did report that he  had some mild headaches for the last 2 months that were felt to be related to allergies.  We reviewed his previous echocardiogram.  I will ordered a CBC, BMP, echocardiogram, and have him follow-up in 1 month.  His echocardiogram showed an LVEF of 30-35%, elevated left atrial pressure, and mild mitral valve regurgitation as well as mild aortic valve regurgitation.  Follow-up with Dr. [name] on 11/06/2021.  During that time denied dyspnea, chest pain palpitations.  He did  note some dizziness with standing.  He reported a syncopal episode after standing suddenly.  His valsartan was decreased to 160 mg daily to allow for increased blood pressure.  Contacted the nurse triage line on 02/17/2022.  He reported that he is having increased weakness in his legs and had fallen.  He reported that he had been hospitalized for pneumonia in June.  At that time his valsartan was reduced to 40 mg daily.  His valsartan was discontinued by Dr.Katragadda.  He presents to the clinic today for follow-up evaluation states he has noticed increased weakness.  He denies dizziness.  He noticed some shortness of breath last night which has stopped.  He reports he is doing ongoing chemotherapy.  He has 3 more treatments around 1 August, will have 3 weeks off, and has 3 more treatments in October.  We reviewed his most recent lab work.  And his recent echocardiogram.  He and his wife expressed understanding.  He reports that he has been increasing his p.o. fluids and has been drinking sports drinks.  He appears to be euvolemic today.  We reviewed the importance of sodium restriction.  His blood pressure today is 116/60.  I will reduce his metoprolol to 25 mg daily.  We will continue fluid restriction, daily weights, give the salty 6 diet sheet and plan follow-up in 3 to 4 months.   Today he denies chest pain, increased shortness of breath, lower extremity edema,palpitations, melena, hematuria, hemoptysis, diaphoresis, presyncope, syncope, orthopnea, and PND.  Home Medications    Prior to Admission medications   Medication Sig Start Date End Date Taking? Authorizing Provider  allopurinol (ZYLOPRIM) 300 MG tablet Take 1 tablet (300 mg total) by mouth daily. 12/09/21   Derek Jack, MD  ARTIFICIAL TEAR SOLUTION OP Place 1 drop into both eyes daily as needed (dry eyes).    [provider]  Ascorbic Acid (VITA-C PO) Take 1,000 mg by mouth daily.    [provider]  b complex  vitamins capsule Take 1 capsule by mouth daily.    [provider]  cefdinir (OMNICEF) 300 MG capsule Take 1 capsule (300 mg total) by mouth every 12 (twelve) hours. 01/21/22   Orson Eva, MD  empagliflozin (JARDIANCE) 10 MG TABS tablet Take 10 mg by mouth daily.    [provider]  ezetimibe (ZETIA) 10 MG tablet TAKE 1 TABLET EVERY DAY Patient taking differently: Take by mouth every other day. 07/21/21   [name], MD  feeding supplement (ENSURE ENLIVE / ENSURE PLUS) LIQD Take 237 mLs by mouth 3 (three) times daily between meals. 01/21/22   Orson Eva, MD  furosemide (LASIX) 20 MG tablet Take 1 tablet (20 mg total) by mouth daily. 12/30/21   Shirley Friar, PA-C  lidocaine-prilocaine (EMLA) cream Apply a small amount to port a cath site (do not rub in) and cover with plastic wrap 1 hour prior to infusion appointments 12/09/21   Derek Jack, MD  metoprolol succinate (TOPROL-XL) 50 MG 24 hr tablet TAKE 1 TABLET DAILY. TAKE WITH OR IMMEDIATELY FOLLOWING A MEAL. 01/19/22   [name], MD  Multiple Vitamins-Minerals (MULTIVITAMIN WITH MINERALS) tablet Take 1 tablet by mouth daily. Centrum silver    [provider]  prochlorperazine (COMPAZINE) 10 MG tablet Take 1 tablet (10 mg total) by mouth every 6 (six) hours as needed for nausea or vomiting. 12/03/21   Derek Jack, MD  rosuvastatin (CRESTOR) 5 MG tablet TAKE 1 TABLET EVERY OTHER DAY 07/09/21   [name], MD  spironolactone (ALDACTONE) 25 MG tablet Take 0.5 tablets (12.5 mg total) by mouth daily. Patient taking differently: Take 25 mg by mouth daily. 01/14/21 01/19/22  [name], MD  triamcinolone (NASACORT) 55 MCG/ACT AERO nasal inhaler Place 2 sprays into the nose daily. Patient taking differently: Place 2 sprays into the nose daily as needed (allergies). 03/22/19   Biagio Borg, MD  valsartan (DIOVAN) 40 MG tablet Take 1 tablet (40 mg total) by mouth daily. 01/21/22   Orson Eva, MD  VITAMIN D PO Take 1,000 Units by mouth daily.    [provider]  warfarin (COUMADIN) 5 MG tablet TAKE 1 TABLET EVERY DAY AT 4 PM Patient taking differently: Take 2.5-5 mg by mouth See admin instructions. Taking 5 mg every day except on Friday taking 1/2  tablet = 2.5 mg 05/29/21   Biagio Borg, MD    Family History    Family History  Problem Relation Age of Onset   Stomach cancer Mother        smokes   Lung cancer Father        chewed tobacco   Heart disease Brother        first MI at 38yo, now 19 for transplant list/ ICM   Pancreatic cancer Brother    Colon cancer Maternal Grandfather    COPD Son        was a smoker   Pancreatic cancer Maternal Uncle    Pancreatic cancer Cousin        mat side x 4   Esophageal cancer Neg Hx    Prostate cancer Neg Hx    Rectal cancer Neg Hx    He indicated that his mother is deceased. He indicated that his father is deceased. He indicated that his brother is deceased. He indicated that the status of his maternal grandfather is unknown. He indicated that his son is alive. He indicated that his maternal uncle is deceased. He indicated that his cousin is deceased. He indicated that the status of his neg hx is unknown.  Social History    Social History   Socioeconomic History   Marital status: Married    Spouse name: [name]   Number of children: 3   Years of education: Not on file   Highest education level: Not on file  Occupational History   Occupation: truck Education administrator: [name]  Tobacco Use   Smoking status: Former    Packs/day: 4.00    Years: 17.00    Total pack years: 68.00    Types: Cigarettes    Quit date: 05/07/1981    Years since quitting: 40.8   Smokeless tobacco: Never   Tobacco comments:    Pt states that he would let most of them "burn" pt states that he used anywhere between 4-5PPD  Vaping Use   Vaping Use: Never used  Substance and Sexual Activity   Alcohol use: Yes    Alcohol/week:  0.0 standard drinks of alcohol    Comment: rare   Drug use: No   Sexual activity: Not on file  Other Topics Concern   Not on file  Social History Narrative   Lives in [place] with spouse   Retired Administrator   Social Determinants of Health   Financial Resource Strain: [place]  (10/27/2021)   Overall Financial Resource Strain (CARDIA)    Difficulty of Paying Living Expenses: Not hard at all  Food Insecurity: No Food Insecurity (10/27/2021)   Hunger Vital Sign    Worried About Running Out of Food in the Last Year: Never true    Ran Out of Food in the Last Year: Never true  Transportation Needs: No Transportation Needs (10/27/2021)   PRAPARE - Hydrologist (Medical): No    Lack of Transportation (Non-Medical): No  Physical Activity: Inactive (10/27/2021)   Exercise Vital Sign    Days of Exercise per Week: 0 days    Minutes of Exercise per Session: 0 min  Stress: No Stress Concern Present (10/27/2021)   [place]    Feeling of Stress : Not at all  Social Connections: Moderately Isolated (10/27/2021)   Social Connection and Isolation Panel [NHANES]    Frequency of Communication with Friends  and Family: More than three times a week    Frequency of Social Gatherings with Friends and Family: More than three times a week    Attends Religious Services: Never    Marine scientist or Organizations: No    Attends Archivist Meetings: Never    Marital Status: Married  Human resources officer Violence: Not At Risk (10/27/2021)   Humiliation, Afraid, Rape, and Kick questionnaire    Fear of Current or Ex-Partner: No    Emotionally Abused: No    Physically Abused: No    Sexually Abused: No     Review of Systems    General:  No chills, fever, night sweats or weight changes.  Cardiovascular:  No chest pain, dyspnea on exertion, edema, orthopnea, palpitations, paroxysmal nocturnal  dyspnea. Dermatological: No rash, lesions/masses Respiratory: No cough, dyspnea Urologic: No hematuria, dysuria Abdominal:   No nausea, vomiting, diarrhea, bright red blood per rectum, melena, or hematemesis Neurologic:  No visual changes, wkns, changes in mental status. All other systems reviewed and are otherwise negative except as noted above.  Physical Exam    VS:  BP 116/60 (BP Location: Left Arm, Patient Position: Sitting, Cuff Size: Normal)   Pulse 60   Ht '5\' 8"'$  (1.727 m)   Wt 198 lb 6.4 oz (90 kg)   SpO2 95%   BMI 30.17 kg/m  , BMI Body mass index is 30.17 kg/m. GEN: Well nourished, well developed, in no acute distress. HEENT: normal. Neck: Supple, no JVD, carotid bruits, or masses. Cardiac: RRR, no murmurs, rubs, or gallops. No clubbing, cyanosis, edema.  Radials/DP/PT 2+ and equal bilaterally.  Respiratory:  Respirations regular and unlabored, clear to auscultation bilaterally. GI: Soft, nontender, nondistended, BS + x 4. MS: no deformity or atrophy. Skin: warm and dry, no rash. Neuro:  Strength and sensation are intact. Psych: Normal affect.  Accessory Clinical Findings    Recent Labs: 10/09/2021: TSH 2.95 01/19/2022: B Natriuretic Peptide 200.0 02/17/2022: ALT 22; BUN 38; Creatinine, Ser 1.61; Hemoglobin 11.4; Magnesium 2.3; Platelets 162; Potassium 4.5; Sodium 135   Recent Lipid Panel    Component Value Date/Time   CHOL 182 10/09/2021 1107   CHOL 165 12/13/2019 1010   TRIG 209.0 (H) 10/09/2021 1107   TRIG 85 06/21/2006 1415   HDL 42.20 10/09/2021 1107   HDL 38 (L) 12/13/2019 1010   CHOLHDL 4 10/09/2021 1107   VLDL 41.8 (H) 10/09/2021 1107   LDLCALC 92 05/21/2021 0911   LDLCALC 101 (H) 12/13/2019 1010   LDLDIRECT 102.0 10/09/2021 1107    ECG personally reviewed by me today-none today.  Echocardiogram 01/12/2022 IMPRESSIONS     1. Left ventricular ejection fraction, by estimation, is 30 to 35%. The  left ventricle has moderately decreased function. The  left ventricle  demonstrates regional wall motion abnormalities (see scoring  diagram/findings for description). Left ventricular   diastolic parameters are indeterminate. There is akinesis of the left  ventricular, mid-apical anteroseptal wall, inferoseptal wall and apical  segment.   2. Right ventricular systolic function is normal. The right ventricular  size is normal.   3. Left atrial size was moderately dilated.   4. The mitral valve is grossly normal. Trivial mitral valve  regurgitation. No evidence of mitral stenosis.   5. The aortic valve is tricuspid. There is moderate calcification of the  aortic valve. Aortic valve regurgitation is mild. Mild aortic valve  stenosis.   6. The inferior vena cava is normal in size with <50% respiratory  variability,  suggesting right atrial pressure of 8 mmHg.   Comparison(s): No significant change from prior study.   Nuclear stress test 01/31/2021  Nuclear stress EF: 29%. There was no ST segment deviation noted during stress. Defect 1: There is a medium defect of severe severity present in the mid anterior, mid anteroseptal, apical anterior, apical septal and apex location. Defect 2: There is a medium defect of moderate severity present in the mid inferior, mid inferolateral, apical inferior and apical lateral location. Findings consistent with prior myocardial infarction in the LAD and RCA territories. The left ventricular ejection fraction is severely decreased (<30%).  Assessment & Plan   1.  Essential hypertension-BP today 116/60.  Well-controlled at home. Reduce metoprolol 25 mg daily Heart healthy low-sodium diet-salty 6 given Increase physical activity as tolerated   Chest pain/shortness of breath-stable/chronic.  Echocardiogram stable.   Diagnosed with pneumonia 6/23.  Echocardiogram 01/12/2022 showed stable LVEF of 30-35%. Reduce metoprolol 25 mg daily Heart healthy low-sodium diet-salty 6 given Increase physical activity as  tolerated  Coronary artery disease-no chest pain today.  Stress testing 6/22 showed EF of 29% with prior infarct but no ischemia.   Continue ezetimibe,  rosuvastatin Reduce metoprolol 25 mg daily Heart healthy low-sodium diet-salty 6 given Increase physical activity as tolerated   Ischemic cardiomyopathy/ICD-ICD placed 01/30/2020 Continue spironolactone,  Reduce metoprolol 25 mg daily Follows with EP  Hyperlipidemia-10/02/2020: Cholesterol 149; HDL 40.50; LDL Cholesterol 77; Triglycerides 158.0; VLDL 31.6 Continue ezetimibe, rosuvastatin Heart healthy low-sodium high-fiber diet increase physical activity as tolerated   History of LV apical thrombus- reports compliance with Coumadin.  Denies bleeding issues. Continue warfarin  Lymphoma-continues cancer treatment.  It appears his fatigue and shortness of breath is related to his cancer treatment. Increase physical activity as tolerated  Disposition: Follow-up with Dr. [name] in 3-4 months.   Jossie Ng. Jerimiah Wolman NP-C     02/20/2022, [facility] [address] Office [phone] Fax [phone]  Notice: This dictation was prepared with Dragon dictation along with smaller phrase technology. Any transcriptional errors that result from this process are unintentional and may not be corrected upon review.  I spent 14 minutes examining this patient, reviewing medications, and using patient centered shared decision making involving her cardiac care.  Prior to her visit I spent greater than 20 minutes reviewing her past medical history,  medications, and prior cardiac tests.

## 2022-02-17 NOTE — Progress Notes (Signed)
[name] [name], [name] [name]   CLINIC:  Medical Oncology/Hematology  PCP:  Biagio Borg, MD [place] / [place] [address] [phone]   REASON FOR VISIT:  Follow-up for stage IVb high-grade B-cell lymphoma  PRIOR THERAPY: none  NGS Results: not done  CURRENT THERAPY: R-CEOP q21d x 3 Cycles  BRIEF ONCOLOGIC HISTORY:  Oncology History  High grade B-cell lymphoma ([place])  12/03/2021 Initial Diagnosis   High grade B-cell lymphoma ([place])   12/15/2021 -  Chemotherapy   Patient is on Treatment Plan : NON-HODGKIN'S LYMPHOMA R-CEOP q21d x 3 Cycles       CANCER STAGING:  Cancer Staging  High grade B-cell lymphoma ([place]) Staging form: Hodgkin and Non-Hodgkin Lymphoma, AJCC 8th Edition - Clinical stage from 12/03/2021: Stage IV (Diffuse large B-cell lymphoma) - Unsigned   INTERVAL HISTORY:  [name], a 76 y.o. male, returns for routine follow-up and consideration for next cycle of chemotherapy. [name] [name] was last seen on 01/27/2022.  Due for cycle #4 of R-CEOP today.   Overall, [name] [name] tells me [name] [name] has been feeling pretty well. [name] [name] reports weakness in his legs following his last treatment which has improved but is still present. [name] [name] reports 1 fall since his last visit due to this weakness. [name] [name] reports fatigue for the past 3 weeks. [name] [name] denies cough. [name] [name] reports poor appetite. [name] [name] denies nausea and vomiting. [name] [name] reports constant numbness in his fingertips; [name] [name] denies associated numbness or changes in his fine motor skills. [name] [name] reports soreness in his left breast which waxes and wanes and started after last treatment. [name] [name] reports SOB with exertion. [name] [name] reports his portions when eating have reduced.   Overall, [name] [name] feels ready for next cycle of chemo today.    REVIEW OF SYSTEMS:  Review of Systems  Constitutional:  Positive for appetite change and fatigue.  Respiratory:  Positive for shortness of breath. Negative for cough.   Cardiovascular:   Positive for chest pain (3/10 L breast).  Gastrointestinal:  Negative for nausea and vomiting.  Neurological:  Positive for extremity weakness (legs) and numbness (fingertips).  Psychiatric/Behavioral:  Positive for sleep disturbance.   All other systems reviewed and are negative.   PAST MEDICAL/SURGICAL HISTORY:  Past Medical History:  Diagnosis Date   ALLERGIC RHINITIS 10/31/2007   Qualifier: Diagnosis of  By: Jenny Reichmann MD, [place]    Allergy    CHOLECYSTECTOMY, HX OF 06/04/2007   Qualifier: Diagnosis of  By: Danny Lawless [place], [place]     COLONIC POLYPS, HX OF 10/31/2007   Qualifier: Diagnosis of  By: Jenny Reichmann MD, [place] ARTERY BYPASS GRAFT, HX OF 06/04/2007   Qualifier: Diagnosis of  By: [place], [place]     CORONARY ARTERY DISEASE 06/04/2007   Qualifier: Diagnosis of  By: Danny Lawless CMA, [place]     Diverticulosis    Erectile dysfunction    Eye twitch    right eye since chilhood    HYPERLIPIDEMIA 06/04/2007   Qualifier: Diagnosis of  By: [place], [place]     Hypersomnolence    HYPERTENSION 06/04/2007   Qualifier: Diagnosis of  By: [place], [place]     Hypothyroidism    ISCHEMIC CARDIOMYOPATHY 06/04/2007   Qualifier: Diagnosis of  By: Danny Lawless CMA, [place]     Myocardial infarction ([place])    per patient , his occurred in 1998    Osteoarthritis    PLMD (periodic limb movement disorder)  Sleep apnea    no cpap' per patient , "i was checked for it and they said i didnt have it "    Vertigo    Vitamin D deficiency    Past Surgical History:  Procedure Laterality Date   BIOPSY  11/25/2021   Procedure: BIOPSY;  Surgeon: Jackquline Denmark, MD;  Location: [facility];  Service: Gastroenterology;;   CHOLECYSTECTOMY     COLONOSCOPY     COLONOSCOPY WITH PROPOFOL N/A 11/25/2021   Procedure: COLONOSCOPY WITH PROPOFOL;  Surgeon: Jackquline Denmark, MD;  Location: [facility];  Service: Gastroenterology;  Laterality: N/A;   CORONARY ARTERY BYPASS GRAFT     ESOPHAGOGASTRODUODENOSCOPY  (EGD) WITH PROPOFOL N/A 11/25/2021   Procedure: ESOPHAGOGASTRODUODENOSCOPY (EGD) WITH PROPOFOL;  Surgeon: Jackquline Denmark, MD;  Location: [facility];  Service: Gastroenterology;  Laterality: N/A;   ICD IMPLANT N/A 01/30/2020   Procedure: ICD IMPLANT;  Surgeon: Thompson Grayer, MD;  Location: [facility];  Service: Cardiovascular;  Laterality: N/A;   IR IMAGING GUIDED PORT INSERTION  12/04/2021   KNEE ARTHROPLASTY Left 08/05/2017   Procedure: LEFT TOTAL KNEE ARTHROPLASTY WITH COMPUTER NAVIGATION;  Surgeon: Rod Can, MD;  Location: [facility];  Service: Orthopedics;  Laterality: Left;  Needs RNFA   PENILE PROSTHESIS IMPLANT     POLYPECTOMY  11/25/2021   Procedure: POLYPECTOMY;  Surgeon: Jackquline Denmark, MD;  Location: [facility];  Service: Gastroenterology;;   REPLACEMENT TOTAL KNEE  06/12/2013   spinal cyst     THORACOTOMY     left anterior; wound exploration and debridement   TONSILLECTOMY AND ADENOIDECTOMY     age 61    SOCIAL HISTORY:  Social History   Socioeconomic History   Marital status: Married    Spouse name: [name]   Number of children: 3   Years of education: Not on file   Highest education level: Not on file  Occupational History   Occupation: truck Education administrator: [name]  Tobacco Use   Smoking status: Former    Packs/day: 4.00    Years: 17.00    Total pack years: 68.00    Types: Cigarettes    Quit date: 05/07/1981    Years since quitting: 40.8   Smokeless tobacco: Never   Tobacco comments:    Pt states that [name] [name] would let most of them "burn" pt states that [name] [name] used anywhere between 4-5PPD  Vaping Use   Vaping Use: Never used  Substance and Sexual Activity   Alcohol use: Yes    Alcohol/week: 0.0 standard drinks of alcohol    Comment: rare   Drug use: No   Sexual activity: Not on file  Other Topics Concern   Not on file  Social History Narrative   Lives in [place] with spouse   Retired Administrator   Social Determinants of Health    Financial Resource Strain: [place]  (10/27/2021)   Overall Financial Resource Strain (CARDIA)    Difficulty of Paying Living Expenses: Not hard at all  Food Insecurity: No Food Insecurity (10/27/2021)   Hunger Vital Sign    Worried About Running Out of Food in the Last Year: Never true    Ran Out of Food in the Last Year: Never true  Transportation Needs: No Transportation Needs (10/27/2021)   PRAPARE - Hydrologist (Medical): No    Lack of Transportation (Non-Medical): No  Physical Activity: Inactive (10/27/2021)   Exercise Vital Sign    Days of Exercise per  Week: 0 days    Minutes of Exercise per Session: 0 min  Stress: No Stress Concern Present (10/27/2021)   [place]    Feeling of Stress : Not at all  Social Connections: Moderately Isolated (10/27/2021)   Social Connection and Isolation Panel [NHANES]    Frequency of Communication with Friends and Family: More than three times a week    Frequency of Social Gatherings with Friends and Family: More than three times a week    Attends Religious Services: Never    Marine scientist or Organizations: No    Attends Archivist Meetings: Never    Marital Status: Married  Human resources officer Violence: Not At Risk (10/27/2021)   Humiliation, Afraid, Rape, and Kick questionnaire    Fear of Current or Ex-Partner: No    Emotionally Abused: No    Physically Abused: No    Sexually Abused: No    FAMILY HISTORY:  Family History  Problem Relation Age of Onset   Stomach cancer Mother        smokes   Lung cancer Father        chewed tobacco   Heart disease Brother        first MI at 3yo, now 50 for transplant list/ ICM   Pancreatic cancer Brother    Colon cancer Maternal Grandfather    COPD Son        was a smoker   Pancreatic cancer Maternal Uncle    Pancreatic cancer Cousin        mat side x 4   Esophageal cancer Neg Hx     Prostate cancer Neg Hx    Rectal cancer Neg Hx     CURRENT MEDICATIONS:  Current Outpatient Medications  Medication Sig Dispense Refill   allopurinol (ZYLOPRIM) 300 MG tablet Take 1 tablet (300 mg total) by mouth daily. 30 tablet 3   ARTIFICIAL TEAR SOLUTION OP Place 1 drop into both eyes daily as needed (dry eyes).     Ascorbic Acid (VITA-C PO) Take 1,000 mg by mouth daily.     b complex vitamins capsule Take 1 capsule by mouth daily.     cefdinir (OMNICEF) 300 MG capsule Take 1 capsule (300 mg total) by mouth every 12 (twelve) hours. 10 capsule 0   empagliflozin (JARDIANCE) 10 MG TABS tablet Take 10 mg by mouth daily.     ezetimibe (ZETIA) 10 MG tablet TAKE 1 TABLET EVERY DAY (Patient taking differently: Take by mouth every other day.) 90 tablet 3   feeding supplement (ENSURE ENLIVE / ENSURE PLUS) LIQD Take 237 mLs by mouth 3 (three) times daily between meals. 237 mL 12   furosemide (LASIX) 20 MG tablet Take 1 tablet (20 mg total) by mouth daily. 90 tablet 3   lidocaine-prilocaine (EMLA) cream Apply a small amount to port a cath site (do not rub in) and cover with plastic wrap 1 hour prior to infusion appointments 30 g 3   metoprolol succinate (TOPROL-XL) 50 MG 24 hr tablet TAKE 1 TABLET DAILY. TAKE WITH OR IMMEDIATELY FOLLOWING A MEAL. 90 tablet 1   Multiple Vitamins-Minerals (MULTIVITAMIN WITH MINERALS) tablet Take 1 tablet by mouth daily. Centrum silver     prochlorperazine (COMPAZINE) 10 MG tablet Take 1 tablet (10 mg total) by mouth every 6 (six) hours as needed for nausea or vomiting. 30 tablet 3   rosuvastatin (CRESTOR) 5 MG tablet TAKE 1 TABLET EVERY OTHER DAY 45 tablet  10   triamcinolone (NASACORT) 55 MCG/ACT AERO nasal inhaler Place 2 sprays into the nose daily. (Patient taking differently: Place 2 sprays into the nose daily as needed (allergies).) 3 Inhaler 3   valsartan (DIOVAN) 40 MG tablet Take 1 tablet (40 mg total) by mouth daily. 30 tablet 1   VITAMIN D PO Take 1,000 Units  by mouth daily.     warfarin (COUMADIN) 5 MG tablet TAKE 1 TABLET EVERY DAY AT 4 PM (Patient taking differently: Take 2.5-5 mg by mouth See admin instructions. Taking 5 mg every day except on Friday taking 1/2 tablet = 2.5 mg) 90 tablet 4   spironolactone (ALDACTONE) 25 MG tablet Take 0.5 tablets (12.5 mg total) by mouth daily. (Patient taking differently: Take 25 mg by mouth daily.) 45 tablet 3   No current facility-administered medications for this visit.    ALLERGIES:  Allergies  Allergen Reactions   Ace Inhibitors Cough   Statins Other (See Comments)    Muscle pains    PHYSICAL EXAM:  Performance status (ECOG): 0 - Asymptomatic  There were no vitals filed for this visit. Wt Readings from Last 3 Encounters:  02/17/22 188 lb 11.2 oz (85.6 kg)  02/04/22 187 lb 9.6 oz (85.1 kg)  01/28/22 197 lb 3.2 oz (89.4 kg)   Physical Exam Vitals reviewed.  Constitutional:      Appearance: Normal appearance.  Cardiovascular:     Rate and Rhythm: Normal rate and regular rhythm.     Pulses: Normal pulses.     Heart sounds: Normal heart sounds.  Pulmonary:     Effort: Pulmonary effort is normal.     Breath sounds: Normal breath sounds.  Chest:  Breasts:    Left: No mass.  Musculoskeletal:     Right lower leg: No edema.     Left lower leg: No edema.  Neurological:     General: No focal deficit present.     Mental Status: [name] [name] is alert and oriented to person, place, and time.  Psychiatric:        Mood and Affect: Mood normal.        Behavior: Behavior normal.     LABORATORY DATA:  I have reviewed the labs as listed.     Latest Ref Rng & Units 02/17/2022    8:00 AM 01/27/2022    8:29 AM 01/21/2022    5:05 AM  CBC  WBC 4.0 - 10.5 K/uL 9.9  8.6  11.6   Hemoglobin 13.0 - 17.0 g/dL 11.4  11.9  12.0   Hematocrit 39.0 - 52.0 % 35.5  36.4  36.4   Platelets 150 - 400 K/uL 162  200  151       Latest Ref Rng & Units 02/17/2022    8:00 AM 01/27/2022    8:29 AM 01/21/2022    5:05 AM  CMP   Glucose 70 - 99 mg/dL 135  181  134   BUN 8 - 23 mg/dL 38  24  20   Creatinine 0.61 - 1.24 mg/dL 1.61  1.33  1.11   Sodium 135 - 145 mmol/L 135  134  137   Potassium 3.5 - 5.1 mmol/L 4.5  4.2  4.4   Chloride 98 - 111 mmol/L 103  100  104   CO2 22 - 32 mmol/L '25  26  27   ' Calcium 8.9 - 10.3 mg/dL 8.8  8.9  8.6   Total Protein 6.5 - 8.1 g/dL 6.4  6.3  Total Bilirubin 0.3 - 1.2 mg/dL 1.0  0.7    Alkaline Phos 38 - 126 U/L 67  77    AST 15 - 41 U/L 25  28    ALT 0 - 44 U/L 22  33      DIAGNOSTIC IMAGING:  I have independently reviewed the scans and discussed with the patient. NM PET Image Restag (PS) Skull Base To Thigh  Result Date: 02/06/2022 CLINICAL DATA:  Subsequent treatment strategy for large B-cell lymphoma. EXAM: NUCLEAR MEDICINE PET SKULL BASE TO THIGH TECHNIQUE: 9.74 mCi F-18 FDG was injected intravenously. Full-ring PET imaging was performed from the skull base to thigh after the radiotracer. CT data was obtained and used for attenuation correction and anatomic localization. Fasting blood glucose: 173 mg/dl COMPARISON:  PET-CT 12/11/2021 FINDINGS: Mediastinal blood pool activity: SUV max 2.64 Liver activity: SUV max 4.04 NECK: No hypermetabolic lymph nodes in the neck. Incidental CT findings: Stable bilateral carotid artery calcifications. CHEST: No hypermetabolic mediastinal or hilar nodes. No suspicious pulmonary nodules on the CT scan. No chest wall lesions, supraclavicular or axillary adenopathy. FDG uptake is noted in the left subclavian vein and could be due to slow flow or thrombosis. Incidental CT findings: No acute pulmonary process. Stable aortic and coronary artery calcifications. ABDOMEN/PELVIS: No abnormal hypermetabolic activity within the liver, pancreas, adrenal glands, or spleen. No hypermetabolic lymph nodes in the abdomen or pelvis. Findings suggest a complete metabolic response to treatment. I do not see any residual measurable lymphadenopathy or residual  hypermetabolism in the stomach or previously enlarged lymph nodes. Incidental CT findings: Stable vascular calcifications. SKELETON: Diffuse marrow hypermetabolism consistent with post treatment rebound or marrow stimulating drugs. Incidental CT findings: none IMPRESSION: 1. PET-CT findings consistent with a complete metabolic response to treatment. Deauville 1. 2. FDG uptake is noted in the left subclavian vein proximal to the entry of the pacer wires. This could represent slow flow or thrombosis. This is similar to prior study. 3. Stable vascular calcifications. Electronically Signed   By: Marijo Sanes M.D.   On: 02/06/2022 10:17   CUP PACEART REMOTE DEVICE CHECK  Result Date: 01/27/2022 Scheduled remote reviewed. Normal device function.  Next remote 91 days- JJB  CT Angio Chest PE W and/or Wo Contrast  Result Date: 01/19/2022 CLINICAL DATA:  Cough, shortness of breath EXAM: CT ANGIOGRAPHY CHEST WITH CONTRAST TECHNIQUE: Multidetector CT imaging of the chest was performed using the standard protocol during bolus administration of intravenous contrast. Multiplanar CT image reconstructions and MIPs were obtained to evaluate the vascular anatomy. RADIATION DOSE REDUCTION: This exam was performed according to the departmental dose-optimization program which includes automated exposure control, adjustment of the mA and/or kV according to patient size and/or use of iterative reconstruction technique. CONTRAST:  28m OMNIPAQUE IOHEXOL 350 MG/ML SOLN COMPARISON:  Previous studies including chest radiographs done earlier today FINDINGS: Cardiovascular: There is homogeneous enhancement in thoracic aorta. There is ectasia of ascending thoracic aorta measuring 3.8 cm. Coronary artery calcifications are seen. There are no intraluminal filling defects in the pulmonary artery branches. Mediastinum/Nodes: No significant lymphadenopathy seen. Lungs/Pleura: Increased interstitial markings are seen in the left upper lobe  with tree-in-bud appearance. Small patchy infiltrate is seen in the posterior right lower lung fields. Small patchy infiltrate is noted in the right posterior costophrenic angle. Small patchy infiltrates are seen in the left lower lobe. There is no focal pulmonary consolidation. There is no pleural effusion or pneumothorax. Upper Abdomen: Surgical clips are seen in gallbladder fossa. Musculoskeletal: Unremarkable.  Review of the MIP images confirms the above findings. IMPRESSION: There is no evidence of pulmonary artery embolism. Coronary artery calcifications are seen. There is ectasia of ascending thoracic aorta measuring 3.8 cm. There is no evidence of thoracic aortic dissection. There is no focal pulmonary consolidation. Small scattered foci of increased interstitial markings are seen in the left upper lobe and both lower lobes. This finding may suggest scarring or interstitial pneumonia. Electronically Signed   By: Elmer Picker M.D.   On: 01/19/2022 13:25   DG Chest Port 1 View  Result Date: 01/19/2022 CLINICAL DATA:  Chest pain and shortness of breath.  Cough. EXAM: PORTABLE CHEST 1 VIEW COMPARISON:  10/09/2021 FINDINGS: The heart size and mediastinal contours are within normal limits. Pacemaker remains in stable position. New right-sided power port is seen with tip overlying the distal SVC. No evidence of pneumothorax. Both lungs are clear. The visualized skeletal structures are unremarkable. IMPRESSION: New right-sided power port in appropriate position. No active cardiopulmonary disease. Electronically Signed   By: Marlaine Hind M.D.   On: 01/19/2022 11:33     ASSESSMENT:  Stage IVb high-grade B-cell lymphoma: - [name] [name] had unintentional weight loss of 20 pounds since September 2022.  Weight has been stable for the last 2 months.  [name] [name] had multiple family members with pancreatic cancer. - CT AP with contrast (10/31/2021): Abnormal nodular thickening of the right anterior pararenal fascia, suspicious  for atypical malignancy.  No evidence of pancreatic malignancy.  Irregular wall thickening of the proximal stomach.  No ascites or peritoneal nodularity. - Biopsy of nodular thickening (11/17/2021): High-grade B-cell lymphoma with Burkitt-like features.  Ki-67 100%. 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  Neoplastic lymphocytes positive for CD20, CD10, Bcl-2, BCL6 and mum 1.  Ki-67 more than 95%.  H pylori IHC negative. - High-grade lymphoma panel (11/17/2021): Negative for BCL6, MYC, BCL2 rearrangements, negative for MYC amplification, t(8:14) not detected. - High risk features for CNS disease: Age more than 25, stage III/IV, extranodal involvement more than 1 site.  Based on 3 points, intermediate risk.  No clinical signs of CNS involvement. - IPI: High intermediate with 3 points (age more than 60, stage III/IV, extranodal involvement more than 1 site) - R-CEOP cycle 1 started on 12/15/2021 - Hospitalized for 2 days after cycle 2 from 01/19/2022 through 01/21/2022 with pneumonia.    Social/family history: - [name] [name] has AICD placed in June 2021 secondary to CHF. 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  [name] [name] worked at [employer] in [place].  [name] [name] had exposure to some chemicals.  [name] [name] quit smoking more than 40 years ago.  [name] [name] drinks mostly beer and occasionally whiskey at bedtime.    Ischemic cardiomyopathy/CHF/apical thrombus: - [name] [name] is on Coumadin.  Will check PT/INR today. - Last seen by Dr. [name] on 11/06/2021.   -Last echo on 12/27/2020 with EF 30-35%.  Limited visualization of endocardium.  LV has moderately decreased function.   PLAN:  Stage IVb high-grade B-cell lymphoma: - PET scan (02/05/2022): Deauville 1 response. - After last cycle [name] [name] has developed generalized weakness, mostly in the legs.  [name] [name] lost about 1.3 pounds.  [name] [name] reportedly fell 1 time.  [name] [name] reportedly lost consciousness.  I have recommended cardiology follow-up as [name] [name]  has pacemaker AICD. - [name] [name] developed numbness in the tips of fingers which is constant. - [name] [name] also reported left breast soreness occasionally.  [name] [name] is on spironolactone. - Blood pressure today is  96/54.  I have recommended holding valsartan. - Reviewed labs today which showed normal LFTs.  CBC was grossly normal. - We will dose reduce VP-16 40 mg/m2, Cytoxan to 600 mg/2, vincristine to 1 mg flat dose. - [name] [name] will come back in 3 weeks for follow-up.  2.  TLS prophylaxis: - Uric acid is 7.6.  [name] [name] is off of allopurinol.  We will continue to monitor.  3.  Acute kidney injury on CKD: - Creatinine is 1.61.  [name] [name] will receive 500 mL normal saline today.   Orders placed this encounter:  No orders of the defined types were placed in this encounter.    Derek Jack, MD [place] [phone]   I, Thana Ates, am acting as a scribe for Dr. Derek Jack.  I, Derek Jack MD, have reviewed the above documentation for accuracy and completeness, and I agree with the above.

## 2022-02-17 NOTE — Patient Instructions (Signed)
[place] at [facility] Discharge Instructions   You were seen and examined today by Dr. Delton Coombes.  He reviewed the results of your lab work which are normal/stable.   He reviewed the results of your PET scan. It shows that treatment has resolved the lymphoma. We will proceed with 3 more cycles of treatment and then repeat a PET scan.   We will proceed with your fourth treatment today.  Return as scheduled in 3 weeks.    Thank you for choosing [place] at [facility] to provide your oncology and hematology care.  To afford each patient quality time with our provider, please arrive at least 15 minutes before your scheduled appointment time.   If you have a lab appointment with the [facility] please come in thru the Main Entrance and check in at the main information desk.  You need to re-schedule your appointment should you arrive 10 or more minutes late.  We strive to give you quality time with our providers, and arriving late affects you and other patients whose appointments are after yours.  Also, if you no show three or more times for appointments you may be dismissed from the clinic at the providers discretion.     Again, thank you for choosing [facility].  Our hope is that these requests will decrease the amount of time that you wait before being seen by our physicians.       _____________________________________________________________  Should you have questions after your visit to [facility] please contact our office at [phone] and follow the prompts.  Our office hours are 8:00 a.m. and 4:30 p.m. Monday - Friday.  Please note that voicemails left after 4:00 p.m. may not be returned until the following business day.  We are closed weekends and major holidays.  You do have access to a nurse 24-7, just call the main number to the clinic [phone] and do not press any options, hold on the line and a  nurse will answer the phone.    For prescription refill requests, have your pharmacy contact our office and allow 72 hours.    Due to Covid, you will need to wear a mask upon entering the hospital. If you do not have a mask, a mask will be given to you at the Main Entrance upon arrival. For doctor visits, patients may have 1 support person age 77 or older with them. For treatment visits, patients can not have anyone with them due to social distancing guidelines and our immunocompromised population.

## 2022-02-17 NOTE — Progress Notes (Signed)
Patient presents today for treatment, Patient okay for treatment today with an additional order of 55m of normal saline.  Patient tolerated chemotherapy with no complaints voiced. Side effects with management reviewed understanding verbalized. Port site clean and dry with no bruising or swelling noted at site. Good blood return noted before and after administration of chemotherapy. Band aid applied. Patient left in satisfactory condition with VSS and no s/s of distress noted.

## 2022-02-17 NOTE — Patient Instructions (Signed)
[facility]  Discharge Instructions: Thank you for choosing [facility] to provide your oncology and hematology care.  If you have a lab appointment with the [facility], please come in thru the Main Entrance and check in at the main information desk.  Wear comfortable clothing and clothing appropriate for easy access to any Portacath or PICC line.   We strive to give you quality time with your provider. You may need to reschedule your appointment if you arrive late (15 or more minutes).  Arriving late affects you and other patients whose appointments are after yours.  Also, if you miss three or more appointments without notifying the office, you may be dismissed from the clinic at the provider's discretion.      For prescription refill requests, have your pharmacy contact our office and allow 72 hours for refills to be completed.    Today you received the following chemotherapy and/or immunotherapy agents R-CEOP, return as scheduled.  To help prevent nausea and vomiting after your treatment, we encourage you to take your nausea medication as directed.  BELOW ARE SYMPTOMS THAT SHOULD BE REPORTED IMMEDIATELY: *FEVER GREATER THAN 100.4 F (38 C) OR HIGHER *CHILLS OR SWEATING *NAUSEA AND VOMITING THAT IS NOT CONTROLLED WITH YOUR NAUSEA MEDICATION *UNUSUAL SHORTNESS OF BREATH *UNUSUAL BRUISING OR BLEEDING *URINARY PROBLEMS (pain or burning when urinating, or frequent urination) *BOWEL PROBLEMS (unusual diarrhea, constipation, pain near the anus) TENDERNESS IN MOUTH AND THROAT WITH OR WITHOUT PRESENCE OF ULCERS (sore throat, sores in mouth, or a toothache) UNUSUAL RASH, SWELLING OR PAIN  UNUSUAL VAGINAL DISCHARGE OR ITCHING   Items with * indicate a potential emergency and should be followed up as soon as possible or go to the Emergency Department if any problems should occur.  Please show the CHEMOTHERAPY ALERT CARD or IMMUNOTHERAPY ALERT CARD at check-in to the  Emergency Department and triage nurse.  Should you have questions after your visit or need to cancel or reschedule your appointment, please contact [facility] [phone]  and follow the prompts.  Office hours are 8:00 a.m. to 4:30 p.m. Monday - Friday. Please note that voicemails left after 4:00 p.m. may not be returned until the following business day.  We are closed weekends and major holidays. You have access to a nurse at all times for urgent questions. Please call the main number to the clinic [phone] and follow the prompts.  For any non-urgent questions, you may also contact your provider using MyChart. We now offer e-Visits for anyone 75 and older to request care online for non-urgent symptoms. For details visit [email or web address].   Also download the MyChart app! Go to the app store, search "MyChart", open the app, select , and log in with your MyChart username and password.  Masks are optional in the cancer centers. If you would like for your care team to wear a mask while they are taking care of you, please let them know. For doctor visits, patients may have with them one support person who is at least 77 years old. At this time, visitors are not allowed in the infusion area.

## 2022-02-18 ENCOUNTER — Inpatient Hospital Stay (HOSPITAL_COMMUNITY): Payer: No Typology Code available for payment source

## 2022-02-18 VITALS — BP 118/72 | HR 64 | Temp 97.0°F | Resp 18

## 2022-02-18 DIAGNOSIS — C8510 Unspecified B-cell lymphoma, unspecified site: Secondary | ICD-10-CM

## 2022-02-18 DIAGNOSIS — Z5111 Encounter for antineoplastic chemotherapy: Secondary | ICD-10-CM | POA: Diagnosis not present

## 2022-02-18 MED ORDER — SODIUM CHLORIDE 0.9% FLUSH
10.0000 mL | INTRAVENOUS | Status: DC | PRN
  Administered 2022-02-18: 10 mL

## 2022-02-18 MED ORDER — HEPARIN SOD (PORK) LOCK FLUSH 100 UNIT/ML IV SOLN
500.0000 [IU] | Freq: Once | INTRAVENOUS | Status: AC | PRN
  Administered 2022-02-18: 500 [IU]

## 2022-02-18 MED ORDER — SODIUM CHLORIDE 0.9 % IV SOLN: Freq: Once | INTRAVENOUS | Status: AC

## 2022-02-18 MED ORDER — PROCHLORPERAZINE MALEATE 10 MG PO TABS
10.0000 mg | ORAL_TABLET | Freq: Once | ORAL | Status: AC
  Administered 2022-02-18: 10 mg via ORAL
  Filled 2022-02-18: qty 1

## 2022-02-18 MED ORDER — SODIUM CHLORIDE 0.9 % IV SOLN
40.0000 mg/m2 | Freq: Once | INTRAVENOUS | Status: AC
  Administered 2022-02-18: 90 mg via INTRAVENOUS
  Filled 2022-02-18: qty 4.5

## 2022-02-18 NOTE — Patient Instructions (Signed)
[facility]  Discharge Instructions: Thank you for choosing  [facility] to provide your oncology and hematology care.  If you have a lab appointment with the Cancer Center, please come in thru the Main Entrance and check in at the main information desk.  Wear comfortable clothing and clothing appropriate for easy access to any Portacath or PICC line.   We strive to give you quality time with your provider. You may need to reschedule your appointment if you arrive late (15 or more minutes).  Arriving late affects you and other patients whose appointments are after yours.  Also, if you miss three or more appointments without notifying the office, you may be dismissed from the clinic at the provider's discretion.      For prescription refill requests, have your pharmacy contact our office and allow 72 hours for refills to be completed.    Today you received the following chemotherapy and/or immunotherapy agents Etoposide.  Etoposide, VP-16 injection What is this medication? ETOPOSIDE, VP-16 (e toe POE side) is a chemotherapy drug. It is used to treat testicular cancer, lung cancer, and other cancers. This medicine may be used for other purposes; ask your health care provider or pharmacist if you have questions. COMMON BRAND NAME(S): Etopophos, Toposar, VePesid What should I tell my care team before I take this medication? They need to know if you have any of these conditions: infection kidney disease liver disease low blood counts, like low white cell, platelet, or red cell counts an unusual or allergic reaction to etoposide, other medicines, foods, dyes, or preservatives pregnant or trying to get pregnant breast-feeding How should I use this medication? This medicine is for infusion into a vein. It is administered in a hospital or clinic by a specially trained health care professional. Talk to your pediatrician regarding the use of this medicine in children.  Special care may be needed. Overdosage: If you think you have taken too much of this medicine contact a poison control center or emergency room at once. NOTE: This medicine is only for you. Do not share this medicine with others. What if I miss a dose? It is important not to miss your dose. Call your doctor or health care professional if you are unable to keep an appointment. What may interact with this medication? This medicine may interact with the following medications: warfarin This list may not describe all possible interactions. Give your health care provider a list of all the medicines, herbs, non-prescription drugs, or dietary supplements you use. Also tell them if you smoke, drink alcohol, or use illegal drugs. Some items may interact with your medicine. What should I watch for while using this medication? Visit your doctor for checks on your progress. This drug may make you feel generally unwell. This is not uncommon, as chemotherapy can affect healthy cells as well as cancer cells. Report any side effects. Continue your course of treatment even though you feel ill unless your doctor tells you to stop. In some cases, you may be given additional medicines to help with side effects. Follow all directions for their use. Call your doctor or health care professional for advice if you get a fever, chills or sore throat, or other symptoms of a cold or flu. Do not treat yourself. This drug decreases your body's ability to fight infections. Try to avoid being around people who are sick. This medicine may increase your risk to bruise or bleed. Call your doctor or health care professional if   you notice any unusual bleeding. Talk to your doctor about your risk of cancer. You may be more at risk for certain types of cancers if you take this medicine. Do not become pregnant while taking this medicine or for at least 6 months after stopping it. Women should inform their doctor if they wish to become  pregnant or think they might be pregnant. Women of child-bearing potential will need to have a negative pregnancy test before starting this medicine. There is a potential for serious side effects to an unborn child. Talk to your health care professional or pharmacist for more information. Do not breast-feed an infant while taking this medicine. Men must use a latex condom during sexual contact with a woman while taking this medicine and for at least 4 months after stopping it. A latex condom is needed even if you have had a vasectomy. Contact your doctor right away if your partner becomes pregnant. Do not donate sperm while taking this medicine and for at least 4 months after you stop taking this medicine. Men should inform their doctors if they wish to father a child. This medicine may lower sperm counts. What side effects may I notice from receiving this medication? Side effects that you should report to your doctor or health care professional as soon as possible: allergic reactions like skin rash, itching or hives, swelling of the face, lips, or tongue low blood counts - this medicine may decrease the number of white blood cells, red blood cells, and platelets. You may be at increased risk for infections and bleeding nausea, vomiting redness, blistering, peeling or loosening of the skin, including inside the mouth signs and symptoms of infection like fever; chills; cough; sore throat; pain or trouble passing urine signs and symptoms of low red blood cells or anemia such as unusually weak or tired; feeling faint or lightheaded; falls; breathing problems unusual bruising or bleeding Side effects that usually do not require medical attention (report to your doctor or health care professional if they continue or are bothersome): changes in taste diarrhea hair loss loss of appetite mouth sores This list may not describe all possible side effects. Call your doctor for medical advice about side effects.  You may report side effects to FDA at 1-800-FDA-1088. Where should I keep my medication? This drug is given in a hospital or clinic and will not be stored at home. NOTE: This sheet is a summary. It may not cover all possible information. If you have questions about this medicine, talk to your doctor, pharmacist, or health care provider.  2023 Elsevier/Gold Standard (2021-06-27 00:00:00)       To help prevent nausea and vomiting after your treatment, we encourage you to take your nausea medication as directed.  BELOW ARE SYMPTOMS THAT SHOULD BE REPORTED IMMEDIATELY: *FEVER GREATER THAN 100.4 F (38 C) OR HIGHER *CHILLS OR SWEATING *NAUSEA AND VOMITING THAT IS NOT CONTROLLED WITH YOUR NAUSEA MEDICATION *UNUSUAL SHORTNESS OF BREATH *UNUSUAL BRUISING OR BLEEDING *URINARY PROBLEMS (pain or burning when urinating, or frequent urination) *BOWEL PROBLEMS (unusual diarrhea, constipation, pain near the anus) TENDERNESS IN MOUTH AND THROAT WITH OR WITHOUT PRESENCE OF ULCERS (sore throat, sores in mouth, or a toothache) UNUSUAL RASH, SWELLING OR PAIN  UNUSUAL VAGINAL DISCHARGE OR ITCHING   Items with * indicate a potential emergency and should be followed up as soon as possible or go to the Emergency Department if any problems should occur.  Please show the CHEMOTHERAPY ALERT CARD or IMMUNOTHERAPY ALERT CARD at   check-in to the Emergency Department and triage nurse.  Should you have questions after your visit or need to cancel or reschedule your appointment, please contact [facility] [phone]  and follow the prompts.  Office hours are 8:00 a.m. to 4:30 p.m. Monday - Friday. Please note that voicemails left after 4:00 p.m. may not be returned until the following business day.  We are closed weekends and major holidays. You have access to a nurse at all times for urgent questions. Please call the main number to the clinic [phone] and follow the prompts.  For any non-urgent  questions, you may also contact your provider using MyChart. We now offer e-Visits for anyone 18 and older to request care online for non-urgent symptoms. For details visit [email or web address].   Also download the MyChart app! Go to the app store, search "MyChart", open the app, select [facility], and log in with your MyChart username and password.  Masks are optional in the cancer centers. If you would like for your care team to wear a mask while they are taking care of you, please let them know. For doctor visits, patients may have with them one support person who is at least 77 years old. At this time, visitors are not allowed in the infusion area.  

## 2022-02-18 NOTE — Progress Notes (Signed)
Patient presents today for D3 of Etoposide infusion.  Patient is in satisfactory condition with no complaints voiced.  Vital signs are stable.  We will proceed with treatment per MD orders.  Patient tolerated treatment well with no complaints voiced.  Patient left ambulatory in stable condition.  Vital signs stable at discharge.  Follow up as scheduled.

## 2022-02-19 ENCOUNTER — Inpatient Hospital Stay (HOSPITAL_COMMUNITY): Payer: No Typology Code available for payment source

## 2022-02-19 VITALS — BP 125/78 | HR 70 | Temp 97.7°F | Resp 18

## 2022-02-19 DIAGNOSIS — Z5111 Encounter for antineoplastic chemotherapy: Secondary | ICD-10-CM | POA: Diagnosis not present

## 2022-02-19 DIAGNOSIS — C8510 Unspecified B-cell lymphoma, unspecified site: Secondary | ICD-10-CM

## 2022-02-19 MED ORDER — SODIUM CHLORIDE 0.9% FLUSH
10.0000 mL | INTRAVENOUS | Status: DC | PRN
  Administered 2022-02-19: 10 mL

## 2022-02-19 MED ORDER — SODIUM CHLORIDE 0.9 % IV SOLN
40.0000 mg/m2 | Freq: Once | INTRAVENOUS | Status: AC
  Administered 2022-02-19: 90 mg via INTRAVENOUS
  Filled 2022-02-19: qty 4.5

## 2022-02-19 MED ORDER — SODIUM CHLORIDE 0.9 % IV SOLN: Freq: Once | INTRAVENOUS | Status: AC

## 2022-02-19 MED ORDER — PALONOSETRON HCL INJECTION 0.25 MG/5ML
0.2500 mg | Freq: Once | INTRAVENOUS | Status: AC
  Administered 2022-02-19: 0.25 mg via INTRAVENOUS
  Filled 2022-02-19: qty 5

## 2022-02-19 MED ORDER — HEPARIN SOD (PORK) LOCK FLUSH 100 UNIT/ML IV SOLN
500.0000 [IU] | Freq: Once | INTRAVENOUS | Status: AC | PRN
  Administered 2022-02-19: 500 [IU]

## 2022-02-19 NOTE — Patient Instructions (Signed)
[facility]  Discharge Instructions: Thank you for choosing [facility] to provide your oncology and hematology care.  If you have a lab appointment with the [facility], please come in thru the Main Entrance and check in at the main information desk.  Wear comfortable clothing and clothing appropriate for easy access to any Portacath or PICC line.   We strive to give you quality time with your provider. You may need to reschedule your appointment if you arrive late (15 or more minutes).  Arriving late affects you and other patients whose appointments are after yours.  Also, if you miss three or more appointments without notifying the office, you may be dismissed from the clinic at the provider's discretion.      For prescription refill requests, have your pharmacy contact our office and allow 72 hours for refills to be completed.    Today you received the following chemotherapy and/or immunotherapy agents VP-16.  Etoposide, VP-16 injection What is this medication? ETOPOSIDE, VP-16 (e toe POE side) is a chemotherapy drug. It is used to treat testicular cancer, lung cancer, and other cancers. This medicine may be used for other purposes; ask your health care provider or pharmacist if you have questions. COMMON BRAND NAME(S): Etopophos, Toposar, VePesid What should I tell my care team before I take this medication? They need to know if you have any of these conditions: infection kidney disease liver disease low blood counts, like low white cell, platelet, or red cell counts an unusual or allergic reaction to etoposide, other medicines, foods, dyes, or preservatives pregnant or trying to get pregnant breast-feeding How should I use this medication? This medicine is for infusion into a vein. It is administered in a hospital or clinic by a specially trained health care professional. Talk to your pediatrician regarding the use of this medicine in children. Special  care may be needed. Overdosage: If you think you have taken too much of this medicine contact a poison control center or emergency room at once. NOTE: This medicine is only for you. Do not share this medicine with others. What if I miss a dose? It is important not to miss your dose. Call your doctor or health care professional if you are unable to keep an appointment. What may interact with this medication? This medicine may interact with the following medications: warfarin This list may not describe all possible interactions. Give your health care provider a list of all the medicines, herbs, non-prescription drugs, or dietary supplements you use. Also tell them if you smoke, drink alcohol, or use illegal drugs. Some items may interact with your medicine. What should I watch for while using this medication? Visit your doctor for checks on your progress. This drug may make you feel generally unwell. This is not uncommon, as chemotherapy can affect healthy cells as well as cancer cells. Report any side effects. Continue your course of treatment even though you feel ill unless your doctor tells you to stop. In some cases, you may be given additional medicines to help with side effects. Follow all directions for their use. Call your doctor or health care professional for advice if you get a fever, chills or sore throat, or other symptoms of a cold or flu. Do not treat yourself. This drug decreases your body's ability to fight infections. Try to avoid being around people who are sick. This medicine may increase your risk to bruise or bleed. Call your doctor or health care professional if  you notice any unusual bleeding. Talk to your doctor about your risk of cancer. You may be more at risk for certain types of cancers if you take this medicine. Do not become pregnant while taking this medicine or for at least 6 months after stopping it. Women should inform their doctor if they wish to become pregnant or  think they might be pregnant. Women of child-bearing potential will need to have a negative pregnancy test before starting this medicine. There is a potential for serious side effects to an unborn child. Talk to your health care professional or pharmacist for more information. Do not breast-feed an infant while taking this medicine. Men must use a latex condom during sexual contact with a woman while taking this medicine and for at least 4 months after stopping it. A latex condom is needed even if you have had a vasectomy. Contact your doctor right away if your partner becomes pregnant. Do not donate sperm while taking this medicine and for at least 4 months after you stop taking this medicine. Men should inform their doctors if they wish to father a child. This medicine may lower sperm counts. What side effects may I notice from receiving this medication? Side effects that you should report to your doctor or health care professional as soon as possible: allergic reactions like skin rash, itching or hives, swelling of the face, lips, or tongue low blood counts - this medicine may decrease the number of white blood cells, red blood cells, and platelets. You may be at increased risk for infections and bleeding nausea, vomiting redness, blistering, peeling or loosening of the skin, including inside the mouth signs and symptoms of infection like fever; chills; cough; sore throat; pain or trouble passing urine signs and symptoms of low red blood cells or anemia such as unusually weak or tired; feeling faint or lightheaded; falls; breathing problems unusual bruising or bleeding Side effects that usually do not require medical attention (report to your doctor or health care professional if they continue or are bothersome): changes in taste diarrhea hair loss loss of appetite mouth sores This list may not describe all possible side effects. Call your doctor for medical advice about side effects. You may  report side effects to FDA at 1-800-FDA-1088. Where should I keep my medication? This drug is given in a hospital or clinic and will not be stored at home. NOTE: This sheet is a summary. It may not cover all possible information. If you have questions about this medicine, talk to your doctor, pharmacist, or health care provider.  2023 Elsevier/Gold Standard (2021-06-27 00:00:00)        To help prevent nausea and vomiting after your treatment, we encourage you to take your nausea medication as directed.  BELOW ARE SYMPTOMS THAT SHOULD BE REPORTED IMMEDIATELY: *FEVER GREATER THAN 100.4 F (38 C) OR HIGHER *CHILLS OR SWEATING *NAUSEA AND VOMITING THAT IS NOT CONTROLLED WITH YOUR NAUSEA MEDICATION *UNUSUAL SHORTNESS OF BREATH *UNUSUAL BRUISING OR BLEEDING *URINARY PROBLEMS (pain or burning when urinating, or frequent urination) *BOWEL PROBLEMS (unusual diarrhea, constipation, pain near the anus) TENDERNESS IN MOUTH AND THROAT WITH OR WITHOUT PRESENCE OF ULCERS (sore throat, sores in mouth, or a toothache) UNUSUAL RASH, SWELLING OR PAIN  UNUSUAL VAGINAL DISCHARGE OR ITCHING   Items with * indicate a potential emergency and should be followed up as soon as possible or go to the Emergency Department if any problems should occur.  Please show the CHEMOTHERAPY ALERT CARD or IMMUNOTHERAPY ALERT CARD  at check-in to the Emergency Department and triage nurse.  Should you have questions after your visit or need to cancel or reschedule your appointment, please contact [facility] [phone]  and follow the prompts.  Office hours are 8:00 a.m. to 4:30 p.m. Monday - Friday. Please note that voicemails left after 4:00 p.m. may not be returned until the following business day.  We are closed weekends and major holidays. You have access to a nurse at all times for urgent questions. Please call the main number to the clinic [phone] and follow the prompts.  For any non-urgent questions,  you may also contact your provider using MyChart. We now offer e-Visits for anyone 92 and older to request care online for non-urgent symptoms. For details visit [email or web address].   Also download the MyChart app! Go to the app store, search "MyChart", open the app, select [facility], and log in with your MyChart username and password.  Masks are optional in the cancer centers. If you would like for your care team to wear a mask while they are taking care of you, please let them know. For doctor visits, patients may have with them one support person who is at least 77 years old. At this time, visitors are not allowed in the infusion area.

## 2022-02-19 NOTE — Progress Notes (Signed)
Patient presents today for D3 of VP-16.  Patient is in satisfactory condition with no complaints voiced.  Vital signs are stable.  No changes since yesterday.  We will proceed with treatment per MD orders.   Patient tolerated treatment well with no complaints voiced.  Patient left ambulatory in stable condition.  Vital signs stable at discharge.  Follow up as scheduled.

## 2022-02-20 ENCOUNTER — Encounter: Payer: Self-pay | Admitting: General Practice

## 2022-02-20 ENCOUNTER — Inpatient Hospital Stay (HOSPITAL_COMMUNITY): Payer: No Typology Code available for payment source

## 2022-02-20 ENCOUNTER — Ambulatory Visit (INDEPENDENT_AMBULATORY_CARE_PROVIDER_SITE_OTHER): Payer: No Typology Code available for payment source | Admitting: General Practice

## 2022-02-20 VITALS — BP 109/59 | HR 67 | Temp 97.0°F | Resp 18

## 2022-02-20 VITALS — BP 116/60 | HR 60 | Ht 68.0 in | Wt 198.4 lb

## 2022-02-20 DIAGNOSIS — R0602 Shortness of breath: Secondary | ICD-10-CM

## 2022-02-20 DIAGNOSIS — E7800 Pure hypercholesterolemia, unspecified: Secondary | ICD-10-CM | POA: Diagnosis not present

## 2022-02-20 DIAGNOSIS — I2510 Atherosclerotic heart disease of native coronary artery without angina pectoris: Secondary | ICD-10-CM | POA: Diagnosis not present

## 2022-02-20 DIAGNOSIS — R079 Chest pain, unspecified: Secondary | ICD-10-CM

## 2022-02-20 DIAGNOSIS — I255 Ischemic cardiomyopathy: Secondary | ICD-10-CM | POA: Diagnosis not present

## 2022-02-20 DIAGNOSIS — I10 Essential (primary) hypertension: Secondary | ICD-10-CM | POA: Diagnosis not present

## 2022-02-20 DIAGNOSIS — Z5111 Encounter for antineoplastic chemotherapy: Secondary | ICD-10-CM | POA: Diagnosis not present

## 2022-02-20 DIAGNOSIS — I240 Acute coronary thrombosis not resulting in myocardial infarction: Secondary | ICD-10-CM

## 2022-02-20 DIAGNOSIS — C8510 Unspecified B-cell lymphoma, unspecified site: Secondary | ICD-10-CM

## 2022-02-20 MED ORDER — PEGFILGRASTIM-CBQV 6 MG/0.6ML ~~LOC~~ SOSY
6.0000 mg | PREFILLED_SYRINGE | Freq: Once | SUBCUTANEOUS | Status: AC
  Administered 2022-02-20: 6 mg via SUBCUTANEOUS
  Filled 2022-02-20: qty 0.6

## 2022-02-20 MED ORDER — METOPROLOL SUCCINATE ER 25 MG PO TB24: 25.0000 mg | ORAL_TABLET | Freq: Every day | ORAL | 6 refills | Status: DC

## 2022-02-20 NOTE — Progress Notes (Signed)
Patient presents today for Udenyca injection.  Patient is in satisfactory condition with no complaints voiced.  Vital signs are stable.  We will proceed with injection per MD orders.

## 2022-02-20 NOTE — Progress Notes (Deleted)
HPI:FU CAD; history dates back to 1998 when he had a myocardial infarction. He subsequently underwent minimally invasive LIMA to his LAD. Cardiac MRI July 2020 showed ejection fraction approximately 30% and apical thrombus noted. Mild aortic and mitral regurgitation. Had ICD placed June 2021. Nuclear study June 2022 showed ejection fraction 29% and infarct but no ischemia.  ABIs June 2023 normal.  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  Referred to vascular surgery.  Echocardiogram June 2023 showed ejection fraction 30 to 35%, moderate left atrial enlargement, mild aortic insufficiency.  Recently diagnosed with high-grade B-cell lymphoma.  Since I last saw him,   Current Outpatient Medications  Medication Sig Dispense Refill   allopurinol (ZYLOPRIM) 300 MG tablet Take 1 tablet (300 mg total) by mouth daily. 30 tablet 3   ARTIFICIAL TEAR SOLUTION OP Place 1 drop into both eyes daily as needed (dry eyes).     Ascorbic Acid (VITA-C PO) Take 1,000 mg by mouth daily.     b complex vitamins capsule Take 1 capsule by mouth daily.     cefdinir (OMNICEF) 300 MG capsule Take 1 capsule (300 mg total) by mouth every 12 (twelve) hours. 10 capsule 0   empagliflozin (JARDIANCE) 10 MG TABS tablet Take 10 mg by mouth daily.     ezetimibe (ZETIA) 10 MG tablet TAKE 1 TABLET EVERY DAY (Patient taking differently: Take by mouth every other day.) 90 tablet 3   feeding supplement (ENSURE ENLIVE / ENSURE PLUS) LIQD Take 237 mLs by mouth 3 (three) times daily between meals. 237 mL 12   furosemide (LASIX) 20 MG tablet Take 1 tablet (20 mg total) by mouth daily. 90 tablet 3   lidocaine-prilocaine (EMLA) cream Apply a small amount to port a cath site (do not rub in) and cover with plastic wrap 1 hour prior to infusion appointments 30 g 3   metoprolol succinate (TOPROL-XL) 50 MG 24 hr tablet TAKE 1 TABLET DAILY. TAKE WITH OR IMMEDIATELY FOLLOWING A MEAL. 90 tablet 1   Multiple Vitamins-Minerals  (MULTIVITAMIN WITH MINERALS) tablet Take 1 tablet by mouth daily. Centrum silver     prochlorperazine (COMPAZINE) 10 MG tablet Take 1 tablet (10 mg total) by mouth every 6 (six) hours as needed for nausea or vomiting. 30 tablet 3   rosuvastatin (CRESTOR) 5 MG tablet TAKE 1 TABLET EVERY OTHER DAY 45 tablet 10   spironolactone (ALDACTONE) 25 MG tablet Take 0.5 tablets (12.5 mg total) by mouth daily. (Patient taking differently: Take 25 mg by mouth daily.) 45 tablet 3   triamcinolone (NASACORT) 55 MCG/ACT AERO nasal inhaler Place 2 sprays into the nose daily. (Patient taking differently: Place 2 sprays into the nose daily as needed (allergies).) 3 Inhaler 3   valsartan (DIOVAN) 40 MG tablet Take 1 tablet (40 mg total) by mouth daily. 30 tablet 1   VITAMIN D PO Take 1,000 Units by mouth daily.     warfarin (COUMADIN) 5 MG tablet TAKE 1 TABLET EVERY DAY AT 4 PM (Patient taking differently: Take 2.5-5 mg by mouth See admin instructions. Taking 5 mg every day except on Friday taking 1/2 tablet = 2.5 mg) 90 tablet 4   No current facility-administered medications for this visit.     Past Medical History:  Diagnosis Date   ALLERGIC RHINITIS 10/31/2007   Qualifier: Diagnosis of  By: Jenny Reichmann MD, [place], HX OF 06/04/2007   Qualifier: Diagnosis of  By: Danny Lawless CMA,  [place]     COLONIC POLYPS, HX OF 10/31/2007   Qualifier: Diagnosis of  By: Jenny Reichmann MD, [place] GRAFT, HX OF 06/04/2007   Qualifier: Diagnosis of  By: [place], [place]     CORONARY ARTERY DISEASE 06/04/2007   Qualifier: Diagnosis of  By: Danny Lawless CMA, [place]     Diverticulosis    Erectile dysfunction    Eye twitch    right eye since chilhood    HYPERLIPIDEMIA 06/04/2007   Qualifier: Diagnosis of  By: [place], [place]     Hypersomnolence    HYPERTENSION 06/04/2007   Qualifier: Diagnosis of  By: [place], [place]     Hypothyroidism    ISCHEMIC CARDIOMYOPATHY 06/04/2007    Qualifier: Diagnosis of  By: Danny Lawless CMA, [place]     Myocardial infarction ([place])    per patient , his occurred in 1998    Osteoarthritis    PLMD (periodic limb movement disorder)    Sleep apnea    no cpap' per patient , "i was checked for it and they said i didnt have it "    Vertigo    Vitamin D deficiency     Past Surgical History:  Procedure Laterality Date   BIOPSY  11/25/2021   Procedure: BIOPSY;  Surgeon: Jackquline Denmark, MD;  Location: [facility];  Service: Gastroenterology;;   CHOLECYSTECTOMY     COLONOSCOPY     COLONOSCOPY WITH PROPOFOL N/A 11/25/2021   Procedure: COLONOSCOPY WITH PROPOFOL;  Surgeon: Jackquline Denmark, MD;  Location: [facility];  Service: Gastroenterology;  Laterality: N/A;   CORONARY ARTERY BYPASS GRAFT     ESOPHAGOGASTRODUODENOSCOPY (EGD) WITH PROPOFOL N/A 11/25/2021   Procedure: ESOPHAGOGASTRODUODENOSCOPY (EGD) WITH PROPOFOL;  Surgeon: Jackquline Denmark, MD;  Location: [facility];  Service: Gastroenterology;  Laterality: N/A;   ICD IMPLANT N/A 01/30/2020   Procedure: ICD IMPLANT;  Surgeon: Thompson Grayer, MD;  Location: [facility];  Service: Cardiovascular;  Laterality: N/A;   IR IMAGING GUIDED PORT INSERTION  12/04/2021   KNEE ARTHROPLASTY Left 08/05/2017   Procedure: LEFT TOTAL KNEE ARTHROPLASTY WITH COMPUTER NAVIGATION;  Surgeon: Rod Can, MD;  Location: [facility];  Service: Orthopedics;  Laterality: Left;  Needs RNFA   PENILE PROSTHESIS IMPLANT     POLYPECTOMY  11/25/2021   Procedure: POLYPECTOMY;  Surgeon: Jackquline Denmark, MD;  Location: [facility];  Service: Gastroenterology;;   REPLACEMENT TOTAL KNEE  06/12/2013   spinal cyst     THORACOTOMY     left anterior; wound exploration and debridement   TONSILLECTOMY AND ADENOIDECTOMY     age 76    Social History   Socioeconomic History   Marital status: Married    Spouse name: [name]   Number of children: 3   Years of education: Not on file   Highest education level: Not on file  Occupational  History   Occupation: truck Education administrator: [name]  Tobacco Use   Smoking status: Former    Packs/day: 4.00    Years: 17.00    Total pack years: 68.00    Types: Cigarettes    Quit date: 05/07/1981    Years since quitting: 40.8   Smokeless tobacco: Never   Tobacco comments:    Pt states that he would let most of them "burn" pt states that he used anywhere between 4-5PPD  Vaping Use   Vaping Use: Never used  Substance and Sexual Activity   Alcohol use: Yes    Alcohol/week:  0.0 standard drinks of alcohol    Comment: rare   Drug use: No   Sexual activity: Not on file  Other Topics Concern   Not on file  Social History Narrative   Lives in [place] with spouse   Retired Administrator   Social Determinants of Health   Financial Resource Strain: [place]  (10/27/2021)   Overall Financial Resource Strain (CARDIA)    Difficulty of Paying Living Expenses: Not hard at all  Food Insecurity: No Food Insecurity (10/27/2021)   Hunger Vital Sign    Worried About Running Out of Food in the Last Year: Never true    Ran Out of Food in the Last Year: Never true  Transportation Needs: No Transportation Needs (10/27/2021)   PRAPARE - Hydrologist (Medical): No    Lack of Transportation (Non-Medical): No  Physical Activity: Inactive (10/27/2021)   Exercise Vital Sign    Days of Exercise per Week: 0 days    Minutes of Exercise per Session: 0 min  Stress: No Stress Concern Present (10/27/2021)   [place]    Feeling of Stress : Not at all  Social Connections: Moderately Isolated (10/27/2021)   Social Connection and Isolation Panel [NHANES]    Frequency of Communication with Friends and Family: More than three times a week    Frequency of Social Gatherings with Friends and Family: More than three times a week    Attends Religious Services: Never    Marine scientist or  Organizations: No    Attends Archivist Meetings: Never    Marital Status: Married  Human resources officer Violence: Not At Risk (10/27/2021)   Humiliation, Afraid, Rape, and Kick questionnaire    Fear of Current or Ex-Partner: No    Emotionally Abused: No    Physically Abused: No    Sexually Abused: No    Family History  Problem Relation Age of Onset   Stomach cancer Mother        smokes   Lung cancer Father        chewed tobacco   Heart disease Brother        first MI at 30yo, now 6 for transplant list/ ICM   Pancreatic cancer Brother    Colon cancer Maternal Grandfather    COPD Son        was a smoker   Pancreatic cancer Maternal Uncle    Pancreatic cancer Cousin        mat side x 4   Esophageal cancer Neg Hx    Prostate cancer Neg Hx    Rectal cancer Neg Hx     ROS: no fevers or chills, productive cough, hemoptysis, dysphasia, odynophagia, melena, hematochezia, dysuria, hematuria, rash, seizure activity, orthopnea, PND, pedal edema, claudication. Remaining systems are negative.  Physical Exam: Well-developed well-nourished in no acute distress.  Skin is warm and dry.  HEENT is normal.  Neck is supple.  Chest is clear to auscultation with normal expansion.  Cardiovascular exam is regular rate and rhythm.  Abdominal exam nontender or distended. No masses palpated. Extremities show no edema. neuro grossly intact  ECG- personally reviewed  A/P  1 coronary artery disease-patient denies chest pain.  Continue statin.  No aspirin given need for anticoagulation.  2 popliteal aneurysm-now followed by vascular surgery.  3 ischemic cardiomyopathy/CHF-continue ARB and beta-blocker.  He previously did not tolerate Entresto.  Continue Jardiance and diuretic at present  dose.  4 hypertension-patient's blood pressure is controlled.  Continue present medical regimen.  Note he has had some difficulties with orthostasis in the past and we are allowing his blood pressure to  run higher.  5 hyperlipidemia-continue statin.  6 apical thrombus-continue Coumadin.  7 ICD-followed by electrophysiology.  8 lymphoma-Per oncology.  Kirk Ruths, MD

## 2022-02-20 NOTE — Patient Instructions (Signed)
[facility]  Discharge Instructions: Thank you for choosing  Cancer Center to provide your oncology and hematology care.  If you have a lab appointment with the Cancer Center, please come in thru the Main Entrance and check in at the main information desk.  Wear comfortable clothing and clothing appropriate for easy access to any Portacath or PICC line.   We strive to give you quality time with your provider. You may need to reschedule your appointment if you arrive late (15 or more minutes).  Arriving late affects you and other patients whose appointments are after yours.  Also, if you miss three or more appointments without notifying the office, you may be dismissed from the clinic at the provider's discretion.      For prescription refill requests, have your pharmacy contact our office and allow 72 hours for refills to be completed.    Today you received the following chemotherapy and/or immunotherapy agents Udenyca.  Pegfilgrastim Injection What is this medication? PEGFILGRASTIM (PEG fil gra stim) lowers the risk of infection in people who are receiving chemotherapy. It works by helping your body make more white blood cells, which protects your body from infection. It may also be used to help people who have been exposed to high doses of radiation. This medicine may be used for other purposes; ask your health care provider or pharmacist if you have questions. COMMON BRAND NAME(S): Fulphila, Fylnetra, Neulasta, Nyvepria, Stimufend, UDENYCA, Ziextenzo What should I tell my care team before I take this medication? They need to know if you have any of these conditions: Kidney disease Latex allergy Ongoing radiation therapy Sickle cell disease Skin reactions to acrylic adhesives (On-Body Injector only) An unusual or allergic reaction to pegfilgrastim, filgrastim, other medications, foods, dyes, or preservatives Pregnant or trying to get pregnant Breast-feeding How  should I use this medication? This medication is for injection under the skin. If you get this medication at home, you will be taught how to prepare and give the pre-filled syringe or how to use the On-body Injector. Refer to the patient Instructions for Use for detailed instructions. Use exactly as directed. Tell your care team immediately if you suspect that the On-body Injector may not have performed as intended or if you suspect the use of the On-body Injector resulted in a missed or partial dose. It is important that you put your used needles and syringes in a special sharps container. Do not put them in a trash can. If you do not have a sharps container, call your pharmacist or care team to get one. Talk to your care team about the use of this medication in children. While this medication may be prescribed for selected conditions, precautions do apply. Overdosage: If you think you have taken too much of this medicine contact a poison control center or emergency room at once. NOTE: This medicine is only for you. Do not share this medicine with others. What if I miss a dose? It is important not to miss your dose. Call your care team if you miss your dose. If you miss a dose due to an On-body Injector failure or leakage, a new dose should be administered as soon as possible using a single prefilled syringe for manual use. What may interact with this medication? Interactions have not been studied. This list may not describe all possible interactions. Give your health care provider a list of all the medicines, herbs, non-prescription drugs, or dietary supplements you use. Also tell them   if you smoke, drink alcohol, or use illegal drugs. Some items may interact with your medicine. What should I watch for while using this medication? Your condition will be monitored carefully while you are receiving this medication. You may need blood work done while you are taking this medication. Talk to your care  team about your risk of cancer. You may be more at risk for certain types of cancer if you take this medication. If you are going to need a MRI, CT scan, or other procedure, tell your care team that you are using this medication (On-Body Injector only). What side effects may I notice from receiving this medication? Side effects that you should report to your care team as soon as possible: Allergic reactions--skin rash, itching, hives, swelling of the face, lips, tongue, or throat Capillary leak syndrome--stomach or muscle pain, unusual weakness or fatigue, feeling faint or lightheaded, decrease in the amount of urine, swelling of the ankles, hands, or feet, trouble breathing High white blood cell level--fever, fatigue, trouble breathing, night sweats, change in vision, weight loss Inflammation of the aorta--fever, fatigue, back, chest, or stomach pain, severe headache Kidney injury (glomerulonephritis)--decrease in the amount of urine, red or dark [place] Gravely urine, foamy or bubbly urine, swelling of the ankles, hands, or feet Shortness of breath or trouble breathing Spleen injury--pain in upper left stomach or shoulder Unusual bruising or bleeding Side effects that usually do not require medical attention (report to your care team if they continue or are bothersome): Bone pain Pain in the hands or feet This list may not describe all possible side effects. Call your doctor for medical advice about side effects. You may report side effects to FDA at 1-800-FDA-1088. Where should I keep my medication? Keep out of the reach of children. If you are using this medication at home, you will be instructed on how to store it. Throw away any unused medication after the expiration date on the label. NOTE: This sheet is a summary. It may not cover all possible information. If you have questions about this medicine, talk to your doctor, pharmacist, or health care provider.  2023 Elsevier/Gold Standard (2021-06-27  00:00:00)        To help prevent nausea and vomiting after your treatment, we encourage you to take your nausea medication as directed.  BELOW ARE SYMPTOMS THAT SHOULD BE REPORTED IMMEDIATELY: *FEVER GREATER THAN 100.4 F (38 C) OR HIGHER *CHILLS OR SWEATING *NAUSEA AND VOMITING THAT IS NOT CONTROLLED WITH YOUR NAUSEA MEDICATION *UNUSUAL SHORTNESS OF BREATH *UNUSUAL BRUISING OR BLEEDING *URINARY PROBLEMS (pain or burning when urinating, or frequent urination) *BOWEL PROBLEMS (unusual diarrhea, constipation, pain near the anus) TENDERNESS IN MOUTH AND THROAT WITH OR WITHOUT PRESENCE OF ULCERS (sore throat, sores in mouth, or a toothache) UNUSUAL RASH, SWELLING OR PAIN  UNUSUAL VAGINAL DISCHARGE OR ITCHING   Items with * indicate a potential emergency and should be followed up as soon as possible or go to the Emergency Department if any problems should occur.  Please show the CHEMOTHERAPY ALERT CARD or IMMUNOTHERAPY ALERT CARD at check-in to the Emergency Department and triage nurse.  Should you have questions after your visit or need to cancel or reschedule your appointment, please contact [facility] [phone]  and follow the prompts.  Office hours are 8:00 a.m. to 4:30 p.m. Monday - Friday. Please note that voicemails left after 4:00 p.m. may not be returned until the following business day.  We are closed weekends and major holidays. You   have access to a nurse at all times for urgent questions. Please call the main number to the clinic [phone] and follow the prompts.  For any non-urgent questions, you may also contact your provider using MyChart. We now offer e-Visits for anyone 18 and older to request care online for non-urgent symptoms. For details visit [email or web address].   Also download the MyChart app! Go to the app store, search "MyChart", open the app, select [facility], and log in with your MyChart username and password.  Masks are optional in the  cancer centers. If you would like for your care team to wear a mask while they are taking care of you, please let them know. For doctor visits, patients may have with them one support person who is at least 77 years old. At this time, visitors are not allowed in the infusion area.  

## 2022-02-20 NOTE — Patient Instructions (Signed)
Medication Instructions:  DECREASE METOPROLOL '25MG'$  DAILY   *If you need a refill on your cardiac medications before your next appointment, please call your pharmacy*  Lab Work:   Testing/Procedures:  NONE    NONE  If you have labs (blood work) drawn today and your tests are completely normal, you will receive your results only by: [place] (if you have MyChart) OR  A paper copy in the mail If you have any lab test that is abnormal or we need to change your treatment, we will call you to review the results.  Special Instructions PLEASE READ AND FOLLOW SALTY 6-ATTACHED-1,'800mg'$  daily  FLUID RESTRICTION 40 - 50 ONLY [place] DAILY  STOP SPORTS DRINKS  TAKE AND LOG YOUR WEIGHT DAILY  Follow-Up: Your next appointment:  3-4 month(s) In Person with Kirk Ruths, MD    At [facility], you and your health needs are our priority.  As part of our continuing mission to provide you with exceptional heart care, we have created designated Provider Care Teams.  These Care Teams include your primary Cardiologist (physician) and Advanced Practice Providers (APPs -  Physician Assistants and Nurse Practitioners) who all work together to provide you with the care you need, when you need it.  Important Information About Sugar             6 SALTY THINGS TO AVOID     1,'800MG'$  DAILY

## 2022-02-23 ENCOUNTER — Ambulatory Visit (INDEPENDENT_AMBULATORY_CARE_PROVIDER_SITE_OTHER): Payer: No Typology Code available for payment source

## 2022-02-23 DIAGNOSIS — Z95810 Presence of automatic (implantable) cardiac defibrillator: Secondary | ICD-10-CM | POA: Diagnosis not present

## 2022-02-23 DIAGNOSIS — I5042 Chronic combined systolic (congestive) and diastolic (congestive) heart failure: Secondary | ICD-10-CM | POA: Diagnosis not present

## 2022-02-24 ENCOUNTER — Telehealth: Payer: Self-pay

## 2022-02-24 NOTE — Progress Notes (Signed)
EPIC Encounter for ICM Monitoring  Patient Name: [name] [name] is a 76 y.o. male Date: 02/24/2022 Primary Care Physican: Biagio Borg, MD Primary Cardiologist: [name] Electrophysiologist: [place] 09/10/2021 Weight:   206 lbs 10/07/2021 Weight: 206 lbs (does not weight at home) 12/29/2021 Weight: 201 lbs 02/20/2022 Office Weight: 198 lbs                                                            Attempted call to patient and unable to reach.  Left detailed message per DPR regarding transmission. Transmission reviewed.     Optivol thoracic impedance suggesting possible fluid accumulation starting 7/10 but trending back toward baseline.  Per 7/14 OV note, patient has been drinking a lot of fluids including sports drinks.     Prescribed: Furosemide 20 mg take 1 tablet(s) by mouth daily   Spironolactone 25 mg take 1 tablet by mouth daily Jardiance 10 mg take 1 tablet by mouth daily   Labs: 02/17/2022 Creatinine 1.61, BUN 38, Potassium 4.5, Sodium 135, GFR 44 01/27/2022 Creatinine 1.33, BUN 24, Potassium 4.2, Sodium 134, GFR 55  01/21/2022 Creatinine 1.11, BUN 20, Potassium 4.4, Sodium 137, GFR >60  01/20/2022 Creatinine 1.08, BUN 22, Potassium 4.6, Sodium 137, GFR >60 01/19/2022 Creatinine 1.35, BUN 26, Potassium 4.8, Sodium 136, GFR 54  A complete set of results can be found in Results Review.   Recommendations:  Left voice mail with ICM number and encouraged to call if experiencing any fluid symptoms.   Follow-up plan: ICM clinic phone appointment on 03/09/2022 to recheck fluid levels.   91 day device clinic remote transmission 04/28/2022.     EP/Cardiology Office Visits: 05/20/2022 with Coletta Memos, NP.  Recall 06/14/2022 with Oda Kilts, PA.   Copy of ICM check sent to Dr. Rayann Heman.  Will send copy to Dr [name] and Annamaria Boots for review if patent is reached.    3 month ICM trend: 02/23/2022.    12-14 Month ICM trend:     Rosalene Billings, RN 02/24/2022 9:49 AM

## 2022-02-24 NOTE — Progress Notes (Signed)
Spoke with patient and heart failure questions reviewed.  He reports having to sit up last night to breath and SOB has continued today.  He is drinking < 64 oz fluid daily.  He is not weighing at home daily but last weight was 184 lbs. He is compliant with taking Lasix 20 mg daily.  Copy sent to Dr [name] and Coletta Memos, NP (since he saw patient in the office on 7/14) for review and recommendations if needed.

## 2022-02-24 NOTE — Telephone Encounter (Signed)
Remote ICM transmission received.  Attempted call to patient regarding ICM remote transmission and left detailed message per DPR.  Advised to return call for any fluid symptoms or questions. Next ICM remote transmission scheduled 03/09/2022.

## 2022-02-25 ENCOUNTER — Other Ambulatory Visit (HOSPITAL_COMMUNITY): Payer: Self-pay | Admitting: Physician Assistant

## 2022-02-25 ENCOUNTER — Telehealth (HOSPITAL_COMMUNITY): Payer: Self-pay | Admitting: *Deleted

## 2022-02-25 DIAGNOSIS — C8510 Unspecified B-cell lymphoma, unspecified site: Secondary | ICD-10-CM

## 2022-02-25 NOTE — Progress Notes (Signed)
[facility] [address] [place] [address] Phone: [phone] Fax: [facility] PROGRESS NOTE   [name] [name] [id number] Aug 22, 1945 76 y.o.  [name] [name] is managed by Dr. Delton Coombes for stage IVb high-grade B-cell lymphoma  Actively treated with chemotherapy/immunotherapy/hormonal therapy: YES  Current therapy: R-CEOP q21 days  Last treated: 02/17/2022  Next scheduled appointment with provider: 03/10/2022  Subjective:  Chief Complaint: Generalized weakness  [name] [name] (76 year old male) is managed by Dr. Delton Coombes for his stage IVb high-grade B-cell lymphoma.  He is receiving R-CEOP every 21 days, with most recent D1/C4 on 02/17/2022.  He received etoposide from 02/17/2022 through 02/19/2022.  He received Udenyca on 02/20/2022.  After his third cycle of chemotherapy, he had generalized weakness, mostly in his legs, reported single fall at home as well as 1 episode of losing consciousness.  He was recommended to follow-up with cardiology as he has pacemaker AICD.  Patient called nurse line yesterday (02/25/2022) report progressive weakness after his Udenyca injection on Friday (02/20/2022).  Patient describes generalized weakness, particularly in his legs.  He reports that he feels "tired and achy all over."  He reports that he has felt like this with previous chemotherapy cycles, but that his symptoms are lasting longer this time.  He has not had any falls, lightheadedness, or syncopal episodes after this cycle of chemotherapy.  He reports that he is drinking about 16 ounces of water daily, as well as about 16 ounces of milk or orange juice.  He reports that he is eating about 50% of his previous food intake.  He tries to drink 1 Boost or Ensure daily.  He denies any fever, chills, night sweats, chest pain, dyspnea on exertion, cough, nausea, vomiting, diarrhea, or abdominal pain.  Following his last cycle  of chemotherapy, he did have evaluation of pacemaker AICD via his cardiologist, and was told that everything looked good.  He has been taking his prednisone for the first 5 days of his chemotherapy cycle, as prescribed.   Review of Systems:  Review of Systems  Constitutional:  Positive for activity change, appetite change and fatigue. Negative for chills, diaphoresis, fever and unexpected weight change.  HENT:  Negative for mouth sores, nosebleeds, sore throat and trouble swallowing.   Respiratory:  Negative for cough and shortness of breath.   Cardiovascular:  Negative for chest pain, palpitations and leg swelling.  Gastrointestinal:  Positive for nausea. Negative for abdominal pain, blood in stool, constipation, diarrhea and vomiting.  Genitourinary:  Negative for dysuria and hematuria.  Neurological:  Positive for weakness. Negative for dizziness, light-headedness, numbness and headaches.  Psychiatric/Behavioral:  Negative for dysphoric mood and sleep disturbance. The patient is not nervous/anxious.      Past Medical History, Surgical history, Social history, and Family history were reviewed as documented elsewhere in chart, and were updated as appropriate.   Assessment & Plan:    1.  Stage IVb high-grade B-cell lymphoma - Primary oncologist is Dr. Delton Coombes - Receiving R-CEOP every 21 days, with most recent D1/C4 on 02/17/2022.  He received etoposide from 02/17/2022 through 02/19/2022.  He received Udenyca on 02/20/2022. - PLAN: Next visit with Dr. Delton Coombes on 03/10/2022.  2.  Generalized weakness and fatigue - Progressive weakness after his Udenyca injection on Friday (02/20/2022). - Patient describes generalized weakness, particularly in his legs, also reports generalized body aches. - Felt similarly previous chemotherapy cycles, but that his symptoms are lasting longer this time. - No  falls, lightheadedness, or syncopal episodes after this cycle of chemotherapy. - No infectious or  cardiac symptoms as noted in HPI. - He reports that he is drinking about 16 ounces of water daily, as well as about 16 ounces of milk or orange juice. - He reports that he is eating about 50% of his previous food intake.  He tries to drink 1 Boost or Ensure daily. - Labs today (02/26/2022) did not show any major abnormalities.  CBC at baseline with Hgb 11.0.  CMP showed baseline kidney function, no major electrolyte disturbances.  TLS labs were negative (normal potassium, LDH, magnesium, uric acid, phosphorus). - PLAN: Suspect that his fatigue is secondary to dehydration on top of postchemotherapy fatigue. - Patient given 1 L IV fluids in clinic today. - Instructed to call clinic if he has any new or worsening symptoms before his next appointment.  3.  Ischemic cardiomyopathy/CHF/apical thrombus: - He is on Coumadin.   - He follows with Coumadin clinic in [place] seen by Dr. [name] on 11/06/2021.   - Last echo on 12/27/2020 with EF 30-35%.  Limited visualization of endocardium.  LV has moderately decreased function. - INR today was 1.3 (subtherapeutic) - Our office contacted Coumadin clinic, who advised patient to take 1-1/2 tablets (7.5 mg) tonight, and then to resume his usual dosing. - PLAN: Patient instructed to take 1-1/2 tablets of Coumadin (7.5 mg) tonight and tomorrow - Otherwise, he is instructed to resume taking 1 tablet (5 mg) warfarin daily EXCEPT on Fridays, when he is to take half tablet (2.5 mg) - Next INR check is scheduled for 03/02/2022 at Coumadin Clinic in [place]   Objective:   Physical Exam:  There were no vitals taken for this visit. ECOG: 2  Physical Exam Vitals reviewed.  Constitutional:      Appearance: Normal appearance.  Cardiovascular:     Rate and Rhythm: Normal rate and regular rhythm.     Pulses: Normal pulses.     Heart sounds: Normal heart sounds.  Pulmonary:     Effort: Pulmonary effort is normal.     Breath sounds: Normal breath sounds.  Chest:   Breasts:    Left: No mass.  Musculoskeletal:     Right lower leg: No edema.     Left lower leg: No edema.  Neurological:     General: No focal deficit present.     Mental Status: He is alert and oriented to person, place, and time.  Psychiatric:        Mood and Affect: Mood normal.        Behavior: Behavior normal.     Lab Review:     Component Value Date/Time   NA 135 02/17/2022 0800   NA 139 12/18/2020 0901   K 4.5 02/17/2022 0800   CL 103 02/17/2022 0800   CO2 25 02/17/2022 0800   GLUCOSE 135 (H) 02/17/2022 0800   BUN 38 (H) 02/17/2022 0800   BUN 17 12/18/2020 0901   CREATININE 1.61 (H) 02/17/2022 0800   CREATININE 1.02 11/06/2016 1255   CALCIUM 8.8 (L) 02/17/2022 0800   PROT 6.4 (L) 02/17/2022 0800   PROT 6.7 12/13/2019 1010   ALBUMIN 3.6 02/17/2022 0800   ALBUMIN 4.2 12/13/2019 1010   AST 25 02/17/2022 0800   ALT 22 02/17/2022 0800   ALKPHOS 67 02/17/2022 0800   BILITOT 1.0 02/17/2022 0800   BILITOT 0.8 12/13/2019 1010   GFRNONAA 44 (L) 02/17/2022 0800   GFRAA 73 01/26/2020 7622  Component Value Date/Time   WBC 9.9 02/17/2022 0800   RBC 3.59 (L) 02/17/2022 0800   HGB 11.4 (L) 02/17/2022 0800   HGB 15.0 12/18/2020 0901   HCT 35.5 (L) 02/17/2022 0800   HCT 44.2 12/18/2020 0901   PLT 162 02/17/2022 0800   PLT 122 (L) 12/18/2020 0901   MCV 98.9 02/17/2022 0800   MCV 93 12/18/2020 0901   MCH 31.8 02/17/2022 0800   MCHC 32.1 02/17/2022 0800   RDW 17.7 (H) 02/17/2022 0800   RDW 12.8 12/18/2020 0901   LYMPHSABS 2.4 02/17/2022 0800   LYMPHSABS 1.4 01/26/2020 0937   MONOABS 1.1 (H) 02/17/2022 0800   EOSABS 0.1 02/17/2022 0800   EOSABS 0.2 01/26/2020 0937   BASOSABS 0.1 02/17/2022 0800   BASOSABS 0.0 01/26/2020 9622   -------------------------------  Imaging from last 24 hours (if applicable):  Radiology interpretation: NM PET Image Restag (PS) Skull Base To Thigh  Result Date: 02/06/2022 CLINICAL DATA:  Subsequent treatment strategy for large  B-cell lymphoma. EXAM: NUCLEAR MEDICINE PET SKULL BASE TO THIGH TECHNIQUE: 9.74 mCi F-18 FDG was injected intravenously. Full-ring PET imaging was performed from the skull base to thigh after the radiotracer. CT data was obtained and used for attenuation correction and anatomic localization. Fasting blood glucose: 173 mg/dl COMPARISON:  PET-CT 12/11/2021 FINDINGS: Mediastinal blood pool activity: SUV max 2.64 Liver activity: SUV max 4.04 NECK: No hypermetabolic lymph nodes in the neck. Incidental CT findings: Stable bilateral carotid artery calcifications. CHEST: No hypermetabolic mediastinal or hilar nodes. No suspicious pulmonary nodules on the CT scan. No chest wall lesions, supraclavicular or axillary adenopathy. FDG uptake is noted in the left subclavian vein and could be due to slow flow or thrombosis. Incidental CT findings: No acute pulmonary process. Stable aortic and coronary artery calcifications. ABDOMEN/PELVIS: No abnormal hypermetabolic activity within the liver, pancreas, adrenal glands, or spleen. No hypermetabolic lymph nodes in the abdomen or pelvis. Findings suggest a complete metabolic response to treatment. I do not see any residual measurable lymphadenopathy or residual hypermetabolism in the stomach or previously enlarged lymph nodes. Incidental CT findings: Stable vascular calcifications. SKELETON: Diffuse marrow hypermetabolism consistent with post treatment rebound or marrow stimulating drugs. Incidental CT findings: none IMPRESSION: 1. PET-CT findings consistent with a complete metabolic response to treatment. [place] 1. 2. FDG uptake is noted in the left subclavian vein proximal to the entry of the pacer wires. This could represent slow flow or thrombosis. This is similar to prior study. 3. Stable vascular calcifications. Electronically Signed   By: Marijo Sanes M.D.   On: 02/06/2022 10:17   CUP PACEART REMOTE DEVICE CHECK  Result Date: 01/27/2022 Scheduled remote reviewed. Normal  device function.  Next remote 91 days- JJB     Wrap-Up:     Medical decision making: Moderate  Time spent on visit: I spent 20 minutes counseling the patient face to face. The total time spent in the appointment was 30 minutes and more than 50% was on counseling.   Harriett Rush, PA-C  02/26/22 3:40 PM

## 2022-02-25 NOTE — Telephone Encounter (Signed)
Patient called stating that he has become progressively weaker since receiving injection on Friday, to the point that it is profound.  Admits to decreased appetite and fluid intake.  Discussed with Tarri Abernethy - PAC and Dr. Delton Coombes and he will be added to schedule tomorrow with labs, fluids and appointment with Rebekah.  Patient aware.

## 2022-02-26 ENCOUNTER — Inpatient Hospital Stay (HOSPITAL_COMMUNITY): Payer: No Typology Code available for payment source

## 2022-02-26 ENCOUNTER — Encounter (HOSPITAL_COMMUNITY): Payer: Self-pay | Admitting: Physician Assistant

## 2022-02-26 ENCOUNTER — Inpatient Hospital Stay (HOSPITAL_BASED_OUTPATIENT_CLINIC_OR_DEPARTMENT_OTHER): Payer: No Typology Code available for payment source | Admitting: Physician Assistant

## 2022-02-26 ENCOUNTER — Ambulatory Visit (INDEPENDENT_AMBULATORY_CARE_PROVIDER_SITE_OTHER): Payer: Medicare HMO | Admitting: *Deleted

## 2022-02-26 VITALS — BP 129/76 | HR 74 | Temp 97.7°F | Resp 18 | Wt 182.2 lb

## 2022-02-26 VITALS — BP 122/82 | HR 91 | Temp 97.9°F | Resp 20 | Wt 182.2 lb

## 2022-02-26 DIAGNOSIS — Z5181 Encounter for therapeutic drug level monitoring: Secondary | ICD-10-CM | POA: Diagnosis not present

## 2022-02-26 DIAGNOSIS — C8300 Small cell B-cell lymphoma, unspecified site: Secondary | ICD-10-CM

## 2022-02-26 DIAGNOSIS — I236 Thrombosis of atrium, auricular appendage, and ventricle as current complications following acute myocardial infarction: Secondary | ICD-10-CM

## 2022-02-26 DIAGNOSIS — Z5111 Encounter for antineoplastic chemotherapy: Secondary | ICD-10-CM | POA: Diagnosis not present

## 2022-02-26 DIAGNOSIS — T451X5A Adverse effect of antineoplastic and immunosuppressive drugs, initial encounter: Secondary | ICD-10-CM

## 2022-02-26 DIAGNOSIS — R5383 Other fatigue: Secondary | ICD-10-CM

## 2022-02-26 DIAGNOSIS — I509 Heart failure, unspecified: Secondary | ICD-10-CM

## 2022-02-26 DIAGNOSIS — I428 Other cardiomyopathies: Secondary | ICD-10-CM | POA: Diagnosis not present

## 2022-02-26 DIAGNOSIS — E860 Dehydration: Secondary | ICD-10-CM | POA: Diagnosis not present

## 2022-02-26 DIAGNOSIS — Z95828 Presence of other vascular implants and grafts: Secondary | ICD-10-CM

## 2022-02-26 DIAGNOSIS — R531 Weakness: Secondary | ICD-10-CM

## 2022-02-26 DIAGNOSIS — C8510 Unspecified B-cell lymphoma, unspecified site: Secondary | ICD-10-CM

## 2022-02-26 DIAGNOSIS — Z5189 Encounter for other specified aftercare: Secondary | ICD-10-CM

## 2022-02-26 DIAGNOSIS — I513 Intracardiac thrombosis, not elsewhere classified: Secondary | ICD-10-CM | POA: Diagnosis not present

## 2022-02-26 LAB — CBC WITH DIFFERENTIAL/PLATELET
Abs Immature Granulocytes: 0.37 10*3/uL — ABNORMAL HIGH (ref 0.00–0.07)
Basophils Absolute: 0.1 10*3/uL (ref 0.0–0.1)
Basophils Relative: 1 %
Eosinophils Absolute: 0.2 10*3/uL (ref 0.0–0.5)
Eosinophils Relative: 3 %
HCT: 34.3 % — ABNORMAL LOW (ref 39.0–52.0)
Hemoglobin: 11 g/dL — ABNORMAL LOW (ref 13.0–17.0)
Immature Granulocytes: 6 %
Lymphocytes Relative: 14 %
Lymphs Abs: 0.8 10*3/uL (ref 0.7–4.0)
MCH: 31.4 pg (ref 26.0–34.0)
MCHC: 32.1 g/dL (ref 30.0–36.0)
MCV: 98 fL (ref 80.0–100.0)
Monocytes Absolute: 0.7 10*3/uL (ref 0.1–1.0)
Monocytes Relative: 11 %
Neutro Abs: 3.7 10*3/uL (ref 1.7–7.7)
Neutrophils Relative %: 65 %
Platelets: 150 10*3/uL (ref 150–400)
RBC: 3.5 MIL/uL — ABNORMAL LOW (ref 4.22–5.81)
RDW: 16.7 % — ABNORMAL HIGH (ref 11.5–15.5)
WBC: 5.8 10*3/uL (ref 4.0–10.5)
nRBC: 1 % — ABNORMAL HIGH (ref 0.0–0.2)

## 2022-02-26 LAB — URIC ACID: Uric Acid, Serum: 7.5 mg/dL (ref 3.7–8.6)

## 2022-02-26 LAB — PHOSPHORUS: Phosphorus: 3.7 mg/dL (ref 2.5–4.6)

## 2022-02-26 LAB — COMPREHENSIVE METABOLIC PANEL
ALT: 21 U/L (ref 0–44)
AST: 24 U/L (ref 15–41)
Albumin: 3.8 g/dL (ref 3.5–5.0)
Alkaline Phosphatase: 97 U/L (ref 38–126)
Anion gap: 9 (ref 5–15)
BUN: 24 mg/dL — ABNORMAL HIGH (ref 8–23)
CO2: 27 mmol/L (ref 22–32)
Calcium: 8.9 mg/dL (ref 8.9–10.3)
Chloride: 99 mmol/L (ref 98–111)
Creatinine, Ser: 1.16 mg/dL (ref 0.61–1.24)
GFR, Estimated: >60 mL/min (ref >60)
Glucose, Bld: 125 mg/dL — ABNORMAL HIGH (ref 70–99)
Potassium: 4.2 mmol/L (ref 3.5–5.1)
Sodium: 135 mmol/L (ref 135–145)
Total Bilirubin: 1.4 mg/dL — ABNORMAL HIGH (ref 0.3–1.2)
Total Protein: 6.9 g/dL (ref 6.5–8.1)

## 2022-02-26 LAB — PROTIME-INR
INR: 1.3 — ABNORMAL HIGH (ref 0.8–1.2)
Prothrombin Time: 15.6 seconds — ABNORMAL HIGH (ref 11.4–15.2)

## 2022-02-26 LAB — MAGNESIUM: Magnesium: 2.1 mg/dL (ref 1.7–2.4)

## 2022-02-26 LAB — LACTATE DEHYDROGENASE: LDH: 144 U/L (ref 98–192)

## 2022-02-26 MED ORDER — SODIUM CHLORIDE 0.9% FLUSH
10.0000 mL | Freq: Once | INTRAVENOUS | Status: AC
  Administered 2022-02-26: 10 mL via INTRAVENOUS

## 2022-02-26 MED ORDER — SODIUM CHLORIDE 0.9 % IV SOLN: INTRAVENOUS | Status: DC

## 2022-02-26 MED ORDER — HEPARIN SOD (PORK) LOCK FLUSH 100 UNIT/ML IV SOLN
500.0000 [IU] | Freq: Once | INTRAVENOUS | Status: AC
  Administered 2022-02-26: 500 [IU] via INTRAVENOUS

## 2022-02-26 NOTE — Patient Instructions (Addendum)
[facility] Discharge Instructions  You were seen today by Tarri Abernethy PA-C for your symptom management visit.  Your generalized weakness and fatigue is likely prolonged effects of chemotherapy which is worsened by decreased appetite and dehydration.  With time, you should notice your energy starting to get better.  It is also important that you drink plenty of water at home (at least 64 ounces daily).  Be as active as you are able to, but allow yourself to get plenty of rest!  It is okay if you are not able to do everything that you used to be able to.  Your physical exam and labs today did not show any other cause of your generalized weakness.  However, if you develop any other symptoms or feel worse, please call the [facility] for further evaluation!  We also checked your INR today to make sure that your warfarin dose is appropriate.  Your INR was low, which means that your blood is too thick.  Make sure that you are continuing to take your warfarin as directed by the Coumadin Clinic. - You should take 1-and-a-half warfarin tablets TONIGHT (7.5 mg total) and TOMORROW. - Starting on Saturday you should resume your previous dose and take 1 tablet (5 mg) every day EXCEPT for Friday - on Fridays you should be taking half tablet (2.5 mg).  FOLLOW-UP APPOINTMENT: Your next scheduled appointment is with Dr. Delton Coombes on 03/10/2022.   - - - - - - - - - - - - - - - - - -    Thank you for choosing [facility] to provide your oncology and hematology care.  To afford each patient quality time with our provider, please arrive at least 15 minutes before your scheduled appointment time.   If you have a lab appointment with the [facility] please come in thru the Main Entrance and check in at the main information desk.  You need to re-schedule your appointment should you arrive 10 or more minutes late.  We strive to give  you quality time with our providers, and arriving late affects you and other patients whose appointments are after yours.  Also, if you no show three or more times for appointments you may be dismissed from the clinic at the providers discretion.     Again, thank you for choosing [facility].  Our hope is that these requests will decrease the amount of time that you wait before being seen by our physicians.       _____________________________________________________________  Should you have questions after your visit to [facility], please contact our office at [phone] and follow the prompts.  Our office hours are 8:00 a.m. and 4:30 p.m. Monday - Friday.  Please note that voicemails left after 4:00 p.m. may not be returned until the following business day.  We are closed weekends and major holidays.  You do have access to a nurse 24-7, just call the main number to the clinic [phone] and do not press any options, hold on the line and a nurse will answer the phone.    For prescription refill requests, have your pharmacy contact our office and allow 72 hours.    Due to Covid, you will need to wear a mask upon entering the hospital. If you do not have a mask, a mask will be given to you at the Main Entrance upon arrival. For doctor visits,  patients may have 1 support person age 30 or older with them. For treatment visits, patients can not have anyone with them due to social distancing guidelines and our immunocompromised population.

## 2022-02-26 NOTE — Patient Instructions (Signed)
Pt started chemo 12/15/2021 Pt takes Prednisone 100 mg on the first 5 days of chemo treatment.  Take warfarin 1 1/2 tablets tonight and tomorrow night then resume 1 tablet daily except 1/2 tablet on Fridays Recheck on Monday.

## 2022-02-27 ENCOUNTER — Encounter (HOSPITAL_COMMUNITY): Payer: Self-pay | Admitting: Hematology

## 2022-03-01 ENCOUNTER — Other Ambulatory Visit: Payer: Self-pay | Admitting: Cardiology

## 2022-03-02 ENCOUNTER — Other Ambulatory Visit: Payer: Self-pay

## 2022-03-02 ENCOUNTER — Ambulatory Visit (INDEPENDENT_AMBULATORY_CARE_PROVIDER_SITE_OTHER): Payer: Medicare HMO | Admitting: *Deleted

## 2022-03-02 DIAGNOSIS — Z5181 Encounter for therapeutic drug level monitoring: Secondary | ICD-10-CM

## 2022-03-02 DIAGNOSIS — I240 Acute coronary thrombosis not resulting in myocardial infarction: Secondary | ICD-10-CM | POA: Diagnosis not present

## 2022-03-02 LAB — POCT INR: INR: 1.4 — AB (ref 2.0–3.0)

## 2022-03-02 NOTE — Patient Instructions (Signed)
Pt started chemo 12/15/2021 Pt takes Prednisone 100 mg on the first 5 days of chemo treatment.  Increase warfarin to 1 tablet daily except 1 1/2 tablets on Mondays Recheck in 1 wk

## 2022-03-03 ENCOUNTER — Ambulatory Visit: Payer: Non-veteran care | Admitting: Cardiology

## 2022-03-03 ENCOUNTER — Ambulatory Visit (HOSPITAL_COMMUNITY)
Admission: RE | Admit: 2022-03-03 | Discharge: 2022-03-03 | Disposition: A | Discharge status: Home / Self Care | Payer: Medicare HMO | Source: Ambulatory Visit | Attending: Vascular Surgery | Admitting: Vascular Surgery

## 2022-03-03 DIAGNOSIS — M4856XA Collapsed vertebra, not elsewhere classified, lumbar region, initial encounter for fracture: Secondary | ICD-10-CM | POA: Diagnosis not present

## 2022-03-03 DIAGNOSIS — R200 Anesthesia of skin: Secondary | ICD-10-CM | POA: Diagnosis not present

## 2022-03-03 DIAGNOSIS — I724 Aneurysm of artery of lower extremity: Secondary | ICD-10-CM | POA: Insufficient documentation

## 2022-03-03 DIAGNOSIS — K5730 Diverticulosis of large intestine without perforation or abscess without bleeding: Secondary | ICD-10-CM | POA: Diagnosis not present

## 2022-03-03 MED ORDER — HEPARIN SOD (PORK) LOCK FLUSH 100 UNIT/ML IV SOLN: 500.0000 [IU] | Freq: Once | INTRAVENOUS | Status: AC

## 2022-03-03 MED ORDER — HEPARIN SOD (PORK) LOCK FLUSH 100 UNIT/ML IV SOLN
INTRAVENOUS | Status: AC
  Administered 2022-03-03: 500 [IU] via INTRAVENOUS
  Filled 2022-03-03: qty 5

## 2022-03-03 MED ORDER — IOHEXOL 350 MG/ML SOLN
100.0000 mL | Freq: Once | INTRAVENOUS | Status: AC | PRN
  Administered 2022-03-03: 100 mL via INTRAVENOUS

## 2022-03-04 ENCOUNTER — Ambulatory Visit (INDEPENDENT_AMBULATORY_CARE_PROVIDER_SITE_OTHER): Payer: Medicare HMO | Admitting: Vascular Surgery

## 2022-03-04 ENCOUNTER — Encounter: Payer: Self-pay | Admitting: Vascular Surgery

## 2022-03-04 VITALS — BP 123/81 | HR 77 | Temp 98.8°F | Resp 14 | Ht 68.0 in | Wt 185.8 lb

## 2022-03-04 DIAGNOSIS — I724 Aneurysm of artery of lower extremity: Secondary | ICD-10-CM

## 2022-03-04 NOTE — Progress Notes (Signed)
[facility]  Patient name: [name] [name] MRN: [id number] DOB: [birth year] Sex: male  REASON FOR VISIT: Follow-up right popliteal artery aneurysm  HPI: [name] [name] is a 76 y.o. male here today in follow-up from my initial visit on 02/04/2022.  He is here today with his wife.  He recently had a CT scan for further evaluation and is here to discuss this.  He continues chemotherapy for his new diagnosis of lymphoma.  He is tolerating this fairly well.  He reports that he remains quite tired with the treatment.  He has several more treatment cycles and then repeat PET scan planned.  Past Medical History:  Diagnosis Date   ALLERGIC RHINITIS 10/31/2007   Qualifier: Diagnosis of  By: Jenny Reichmann MD, [place] Oris    Allergy    CHOLECYSTECTOMY, HX OF 06/04/2007   Qualifier: Diagnosis of  By: Danny Lawless CMA, [place]     COLONIC POLYPS, HX OF 10/31/2007   Qualifier: Diagnosis of  By: Jenny Reichmann MD, Hunt Oris    CORONARY ARTERY BYPASS GRAFT, HX OF 06/04/2007   Qualifier: Diagnosis of  By: [place], [place]     CORONARY ARTERY DISEASE 06/04/2007   Qualifier: Diagnosis of  By: Danny Lawless CMA, [place]     Diverticulosis    Erectile dysfunction    Eye twitch    right eye since chilhood    HYPERLIPIDEMIA 06/04/2007   Qualifier: Diagnosis of  By: [place], [place]     Hypersomnolence    HYPERTENSION 06/04/2007   Qualifier: Diagnosis of  By: [place], [place]     Hypothyroidism    ISCHEMIC CARDIOMYOPATHY 06/04/2007   Qualifier: Diagnosis of  By: Danny Lawless CMA, [place]     Myocardial infarction ([place])    per patient , his occurred in 1998    Osteoarthritis    PLMD (periodic limb movement disorder)    Sleep apnea    no cpap' per patient , "i was checked for it and they said i didnt have it "    Vertigo    Vitamin D deficiency     Family History  Problem Relation Age of Onset   Stomach cancer Mother        smokes   Lung cancer Father         chewed tobacco   Heart disease Brother        first MI at 53yo, now 107 for transplant list/ ICM   Pancreatic cancer Brother    Colon cancer Maternal Grandfather    COPD Son        was a smoker   Pancreatic cancer Maternal Uncle    Pancreatic cancer Cousin        mat side x 4   Esophageal cancer Neg Hx    Prostate cancer Neg Hx    Rectal cancer Neg Hx     SOCIAL HISTORY: Social History   Tobacco Use   Smoking status: Former    Packs/day: 4.00    Years: 17.00    Total pack years: 68.00    Types: Cigarettes    Quit date: 05/07/1981    Years since quitting: 40.8    Passive exposure: Never   Smokeless tobacco: Never   Tobacco comments:    Pt states that he would let most of them "burn" pt states that he used anywhere between 4-5PPD  Substance Use Topics   Alcohol use: Yes    Alcohol/week: 0.0 standard drinks of alcohol  Comment: rare    Allergies  Allergen Reactions   Ace Inhibitors Cough   Statins Other (See Comments)    Muscle pains    Current Outpatient Medications  Medication Sig Dispense Refill   allopurinol (ZYLOPRIM) 300 MG tablet Take 1 tablet (300 mg total) by mouth daily. 30 tablet 3   ARTIFICIAL TEAR SOLUTION OP Place 1 drop into both eyes daily as needed (dry eyes).     Ascorbic Acid (VITA-C PO) Take 1,000 mg by mouth daily.     b complex vitamins capsule Take 1 capsule by mouth daily.     empagliflozin (JARDIANCE) 10 MG TABS tablet Take 10 mg by mouth daily.     ezetimibe (ZETIA) 10 MG tablet TAKE 1 TABLET EVERY DAY (Patient taking differently: Take by mouth every other day.) 90 tablet 3   feeding supplement (ENSURE ENLIVE / ENSURE PLUS) LIQD Take 237 mLs by mouth 3 (three) times daily between meals. 237 mL 12   furosemide (LASIX) 20 MG tablet Take 1 tablet (20 mg total) by mouth daily. 90 tablet 3   lidocaine-prilocaine (EMLA) cream Apply a small amount to port a cath site (do not rub in) and cover with plastic wrap 1 hour prior to infusion appointments  30 g 3   metoprolol succinate (TOPROL-XL) 25 MG 24 hr tablet Take 1 tablet (25 mg total) by mouth daily. Take with or immediately following a meal. 30 tablet 6   Multiple Vitamins-Minerals (MULTIVITAMIN WITH MINERALS) tablet Take 1 tablet by mouth daily. Centrum silver     prochlorperazine (COMPAZINE) 10 MG tablet Take 1 tablet (10 mg total) by mouth every 6 (six) hours as needed for nausea or vomiting. 30 tablet 3   rosuvastatin (CRESTOR) 5 MG tablet TAKE 1 TABLET EVERY OTHER DAY 45 tablet 10   spironolactone (ALDACTONE) 25 MG tablet TAKE 1/2 TABLET EVERY DAY 45 tablet 3   triamcinolone (NASACORT) 55 MCG/ACT AERO nasal inhaler Place 2 sprays into the nose daily. (Patient taking differently: Place 2 sprays into the nose daily as needed (allergies).) 3 Inhaler 3   VITAMIN D PO Take 1,000 Units by mouth daily.     warfarin (COUMADIN) 5 MG tablet TAKE 1 TABLET EVERY DAY AT 4 PM (Patient taking differently: Take 2.5-5 mg by mouth See admin instructions. Taking 5 mg every day except on Friday taking 1/2 tablet = 2.5 mg) 90 tablet 4   No current facility-administered medications for this visit.    REVIEW OF SYSTEMS:  '[X]'$  denotes positive finding, '[ ]'$  denotes negative finding Cardiac  Comments:  Chest pain or chest pressure:    Shortness of breath upon exertion:    Short of breath when lying flat:    Irregular heart rhythm:        Vascular    Pain in calf, thigh, or hip brought on by ambulation:    Pain in feet at night that wakes you up from your sleep:     Blood clot in your veins:    Leg swelling:           PHYSICAL EXAM: Vitals:   03/04/22 1112  BP: 123/81  Pulse: 77  Resp: 14  Temp: 98.8 F (37.1 C)  TempSrc: Temporal  SpO2: 94%  Weight: 185 lb 12.8 oz (84.3 kg)  Height: '5\' 8"'$  (1.727 m)    GENERAL: The patient is a well-nourished male, in no acute distress. The vital signs are documented above. CARDIOVASCULAR: Easily palpable nontender right popliteal aneurysm.  2+  dorsalis  pedis and posterior tibial pulse on the right. PULMONARY: There is good air exchange  MUSCULOSKELETAL: There are no major deformities or cyanosis. NEUROLOGIC: No focal weakness or paresthesias are detected. SKIN: There are no ulcers or rashes noted. PSYCHIATRIC: The patient has a normal affect.  DATA:  CT scan was reviewed with images being reviewed with the patient and his wife.  There is some limitation due to bilateral total knee replacement.  He does have a 4.7 cm popliteal artery aneurysm.  This begins above the knee with a large caliber native distal SFA.  The aneurysm appears to and with involvement of the origin of the anterior tibial artery.  The tibioperoneal trunk and posterior tibials appear to be normal and patent to the foot  MEDICAL ISSUES: I again discussed indication for treatment of his large popliteal artery aneurysm.  Certainly would recommend delaying this until he has completed his chemotherapy and is on his way to recovery from this.  I did discuss open repair and also potential for stent graft repair.  I did explain in all likelihood would have to sacrifice the anterior tibial artery for stent graft repair.  I have recommended that we see him again in 2 months following his completion of lymphoma treatment.  We will schedule an appointment for him to see Dr. Carlis Abbott in our [facility] office for discussion of open versus stent graft repair.    Rosetta Posner, MD FACS [facility] [phone]  Note: Portions of this report may have been transcribed using voice recognition software.  Every effort has been made to ensure accuracy; however, inadvertent computerized transcription errors may still be present.

## 2022-03-09 ENCOUNTER — Ambulatory Visit (INDEPENDENT_AMBULATORY_CARE_PROVIDER_SITE_OTHER): Payer: Medicare HMO

## 2022-03-09 ENCOUNTER — Ambulatory Visit (INDEPENDENT_AMBULATORY_CARE_PROVIDER_SITE_OTHER): Payer: Medicare HMO | Admitting: *Deleted

## 2022-03-09 DIAGNOSIS — I5042 Chronic combined systolic (congestive) and diastolic (congestive) heart failure: Secondary | ICD-10-CM

## 2022-03-09 DIAGNOSIS — I240 Acute coronary thrombosis not resulting in myocardial infarction: Secondary | ICD-10-CM | POA: Diagnosis not present

## 2022-03-09 DIAGNOSIS — Z5181 Encounter for therapeutic drug level monitoring: Secondary | ICD-10-CM

## 2022-03-09 DIAGNOSIS — Z95810 Presence of automatic (implantable) cardiac defibrillator: Secondary | ICD-10-CM

## 2022-03-09 LAB — POCT INR: INR: 1.8 — AB (ref 2.0–3.0)

## 2022-03-09 NOTE — Patient Instructions (Signed)
Pt started chemo 12/15/2021 Pt takes Prednisone 100 mg on the first 5 days of chemo treatment.  Continue warfarin 1 tablet daily except 1 1/2 tablets on Mondays Starting 4th round of chemo tomorrow  Recheck in 1 wk

## 2022-03-10 ENCOUNTER — Telehealth: Payer: Self-pay

## 2022-03-10 ENCOUNTER — Inpatient Hospital Stay: Payer: No Typology Code available for payment source | Attending: Hematology

## 2022-03-10 ENCOUNTER — Inpatient Hospital Stay: Payer: No Typology Code available for payment source

## 2022-03-10 ENCOUNTER — Inpatient Hospital Stay (HOSPITAL_BASED_OUTPATIENT_CLINIC_OR_DEPARTMENT_OTHER): Payer: No Typology Code available for payment source | Admitting: Hematology

## 2022-03-10 VITALS — BP 133/64 | HR 79 | Temp 97.8°F | Resp 16

## 2022-03-10 DIAGNOSIS — N189 Chronic kidney disease, unspecified: Secondary | ICD-10-CM

## 2022-03-10 DIAGNOSIS — C8333 Diffuse large B-cell lymphoma, intra-abdominal lymph nodes: Secondary | ICD-10-CM | POA: Insufficient documentation

## 2022-03-10 DIAGNOSIS — Z5189 Encounter for other specified aftercare: Secondary | ICD-10-CM | POA: Insufficient documentation

## 2022-03-10 DIAGNOSIS — C8339 Diffuse large B-cell lymphoma, extranodal and solid organ sites: Secondary | ICD-10-CM | POA: Diagnosis not present

## 2022-03-10 DIAGNOSIS — N179 Acute kidney failure, unspecified: Secondary | ICD-10-CM | POA: Insufficient documentation

## 2022-03-10 DIAGNOSIS — Z5111 Encounter for antineoplastic chemotherapy: Secondary | ICD-10-CM | POA: Insufficient documentation

## 2022-03-10 DIAGNOSIS — I255 Ischemic cardiomyopathy: Secondary | ICD-10-CM | POA: Diagnosis not present

## 2022-03-10 DIAGNOSIS — C8510 Unspecified B-cell lymphoma, unspecified site: Secondary | ICD-10-CM

## 2022-03-10 DIAGNOSIS — Z800 Family history of malignant neoplasm of digestive organs: Secondary | ICD-10-CM

## 2022-03-10 DIAGNOSIS — Z87891 Personal history of nicotine dependence: Secondary | ICD-10-CM | POA: Diagnosis not present

## 2022-03-10 DIAGNOSIS — Z801 Family history of malignant neoplasm of trachea, bronchus and lung: Secondary | ICD-10-CM

## 2022-03-10 DIAGNOSIS — I509 Heart failure, unspecified: Secondary | ICD-10-CM | POA: Diagnosis not present

## 2022-03-10 DIAGNOSIS — Z5112 Encounter for antineoplastic immunotherapy: Secondary | ICD-10-CM | POA: Diagnosis not present

## 2022-03-10 LAB — LACTATE DEHYDROGENASE: LDH: 140 U/L (ref 98–192)

## 2022-03-10 LAB — CBC WITH DIFFERENTIAL/PLATELET
Abs Immature Granulocytes: 0.12 10*3/uL — ABNORMAL HIGH (ref 0.00–0.07)
Basophils Absolute: 0.1 10*3/uL (ref 0.0–0.1)
Basophils Relative: 1 %
Eosinophils Absolute: 0.2 10*3/uL (ref 0.0–0.5)
Eosinophils Relative: 3 %
HCT: 36.6 % — ABNORMAL LOW (ref 39.0–52.0)
Hemoglobin: 11.6 g/dL — ABNORMAL LOW (ref 13.0–17.0)
Immature Granulocytes: 1 %
Lymphocytes Relative: 24 %
Lymphs Abs: 2 10*3/uL (ref 0.7–4.0)
MCH: 31.9 pg (ref 26.0–34.0)
MCHC: 31.7 g/dL (ref 30.0–36.0)
MCV: 100.5 fL — ABNORMAL HIGH (ref 80.0–100.0)
Monocytes Absolute: 0.8 10*3/uL (ref 0.1–1.0)
Monocytes Relative: 9 %
Neutro Abs: 5.2 10*3/uL (ref 1.7–7.7)
Neutrophils Relative %: 62 %
Platelets: 176 10*3/uL (ref 150–400)
RBC: 3.64 MIL/uL — ABNORMAL LOW (ref 4.22–5.81)
RDW: 17.5 % — ABNORMAL HIGH (ref 11.5–15.5)
WBC: 8.4 10*3/uL (ref 4.0–10.5)
nRBC: 0 % (ref 0.0–0.2)

## 2022-03-10 LAB — COMPREHENSIVE METABOLIC PANEL
ALT: 18 U/L (ref 0–44)
AST: 24 U/L (ref 15–41)
Albumin: 3.6 g/dL (ref 3.5–5.0)
Alkaline Phosphatase: 68 U/L (ref 38–126)
Anion gap: 8 (ref 5–15)
BUN: 26 mg/dL — ABNORMAL HIGH (ref 8–23)
CO2: 26 mmol/L (ref 22–32)
Calcium: 9.1 mg/dL (ref 8.9–10.3)
Chloride: 101 mmol/L (ref 98–111)
Creatinine, Ser: 1.16 mg/dL (ref 0.61–1.24)
GFR, Estimated: >60 mL/min (ref >60)
Glucose, Bld: 181 mg/dL — ABNORMAL HIGH (ref 70–99)
Potassium: 3.9 mmol/L (ref 3.5–5.1)
Sodium: 135 mmol/L (ref 135–145)
Total Bilirubin: 0.9 mg/dL (ref 0.3–1.2)
Total Protein: 6.6 g/dL (ref 6.5–8.1)

## 2022-03-10 LAB — URIC ACID: Uric Acid, Serum: 7.1 mg/dL (ref 3.7–8.6)

## 2022-03-10 LAB — MAGNESIUM: Magnesium: 2.1 mg/dL (ref 1.7–2.4)

## 2022-03-10 MED ORDER — FAMOTIDINE IN NACL 20-0.9 MG/50ML-% IV SOLN
20.0000 mg | Freq: Once | INTRAVENOUS | Status: AC
  Administered 2022-03-10: 20 mg via INTRAVENOUS
  Filled 2022-03-10: qty 50

## 2022-03-10 MED ORDER — SODIUM CHLORIDE 0.9 % IV SOLN
375.0000 mg/m2 | Freq: Once | INTRAVENOUS | Status: AC
  Administered 2022-03-10: 800 mg via INTRAVENOUS
  Filled 2022-03-10: qty 50

## 2022-03-10 MED ORDER — SODIUM CHLORIDE 0.9 % IV SOLN
562.5000 mg/m2 | Freq: Once | INTRAVENOUS | Status: AC
  Administered 2022-03-10: 1220 mg via INTRAVENOUS
  Filled 2022-03-10: qty 61

## 2022-03-10 MED ORDER — SODIUM CHLORIDE 0.9 % IV SOLN
10.0000 mg | Freq: Once | INTRAVENOUS | Status: AC
  Administered 2022-03-10: 10 mg via INTRAVENOUS
  Filled 2022-03-10: qty 10

## 2022-03-10 MED ORDER — ACETAMINOPHEN 325 MG PO TABS
650.0000 mg | ORAL_TABLET | Freq: Once | ORAL | Status: AC
  Administered 2022-03-10: 650 mg via ORAL
  Filled 2022-03-10: qty 2

## 2022-03-10 MED ORDER — SODIUM CHLORIDE 0.9% FLUSH
10.0000 mL | INTRAVENOUS | Status: DC | PRN
  Administered 2022-03-10: 10 mL

## 2022-03-10 MED ORDER — PALONOSETRON HCL INJECTION 0.25 MG/5ML
0.2500 mg | Freq: Once | INTRAVENOUS | Status: DC
  Filled 2022-03-10: qty 5

## 2022-03-10 MED ORDER — DIPHENHYDRAMINE HCL 25 MG PO CAPS
50.0000 mg | ORAL_CAPSULE | Freq: Once | ORAL | Status: AC
  Administered 2022-03-10: 50 mg via ORAL
  Filled 2022-03-10: qty 2

## 2022-03-10 MED ORDER — HEPARIN SOD (PORK) LOCK FLUSH 100 UNIT/ML IV SOLN
500.0000 [IU] | Freq: Once | INTRAVENOUS | Status: AC | PRN
  Administered 2022-03-10: 500 [IU]

## 2022-03-10 MED ORDER — SODIUM CHLORIDE 0.9 % IV SOLN: Freq: Once | INTRAVENOUS | Status: AC

## 2022-03-10 MED ORDER — SODIUM CHLORIDE 0.9 % IV SOLN
80.0000 mg | Freq: Once | INTRAVENOUS | Status: AC
  Administered 2022-03-10: 80 mg via INTRAVENOUS
  Filled 2022-03-10: qty 4

## 2022-03-10 MED ORDER — PALONOSETRON HCL INJECTION 0.25 MG/5ML
0.2500 mg | Freq: Once | INTRAVENOUS | Status: AC
  Administered 2022-03-10: 0.25 mg via INTRAVENOUS

## 2022-03-10 MED ORDER — VINCRISTINE SULFATE CHEMO INJECTION 1 MG/ML
1.0000 mg | Freq: Once | INTRAVENOUS | Status: AC
  Administered 2022-03-10: 1 mg via INTRAVENOUS
  Filled 2022-03-10: qty 1

## 2022-03-10 NOTE — Telephone Encounter (Signed)
Remote ICM transmission received.  Attempted call to patient regarding ICM remote transmission and left detailed message per DPR.  Advised to return call for any fluid symptoms or questions. Next ICM remote transmission scheduled 03/16/2022.

## 2022-03-10 NOTE — Progress Notes (Signed)
EPIC Encounter for ICM Monitoring  Patient Name: [name] [name] is a 76 y.o. male Date: 03/10/2022 Primary Care Physican: Biagio Borg, MD Primary Cardiologist: [name] Electrophysiologist: Allred 09/10/2021 Weight:   206 lbs 10/07/2021 Weight: 206 lbs (does not weight at home) 12/29/2021 Weight: 201 lbs 02/20/2022 Office Weight: 198 lbs                                                            Attempted call to patient and unable to reach.  Left detailed message per DPR regarding transmission. Transmission reviewed.     Optivol thoracic impedance suggesting possible fluid accumulation starting 7/24.  Decreased impedance appears to correlate with chemo infusions.   Prescribed: Furosemide 20 mg take 1 tablet(s) by mouth daily   Spironolactone 25 mg take 1 tablet by mouth daily Jardiance 10 mg take 1 tablet by mouth daily   Labs: 03/10/2022 Creatinine 1.16, BUN 26, Potassium 3.9, Sodium 135, GFR >60 02/26/2022 Creatinine 1.16, BUN 24, Potassium 4.2, Sodium 135, GFR >60  02/17/2022 Creatinine 1.61, BUN 38, Potassium 4.5, Sodium 135, GFR 44 01/27/2022 Creatinine 1.33, BUN 24, Potassium 4.2, Sodium 134, GFR 55  01/21/2022 Creatinine 1.11, BUN 20, Potassium 4.4, Sodium 137, GFR >60  01/20/2022 Creatinine 1.08, BUN 22, Potassium 4.6, Sodium 137, GFR >60 01/19/2022 Creatinine 1.35, BUN 26, Potassium 4.8, Sodium 136, GFR 54  A complete set of results can be found in Results Review.   Recommendations:  Left voice mail with ICM number and encouraged to call if experiencing any fluid symptoms.   Follow-up plan: ICM clinic phone appointment on 03/16/2022 to recheck fluid levels.   91 day device clinic remote transmission 04/28/2022.     EP/Cardiology Office Visits: 05/20/2022 with Coletta Memos, NP.  Recall 06/14/2022 with Oda Kilts, PA.   Copy of ICM check sent to Dr. Rayann Heman.  Will send to Dr Domenic Polite for review if patient is reached.   3 month ICM trend: 03/09/2022.    12-14 Month ICM  trend:     Rosalene Billings, RN 03/10/2022 2:38 PM

## 2022-03-10 NOTE — Progress Notes (Signed)
Pt presents today for R-CEOP per provider's order. Vital signs and labs WNL for treatment. Okay to proceed with treatment today per Dr.K.  R-CEOP given today per MD orders. Tolerated infusion without adverse affects. Vital signs stable. No complaints at this time. Discharged from clinic ambulatory in stable condition. Alert and oriented x 3. F/U with [facility] as scheduled.

## 2022-03-10 NOTE — Progress Notes (Signed)
[name] [name], [place] [address]   CLINIC:  Medical Oncology/Hematology  PCP:  Biagio Borg, MD [place] / [place] [address] [phone]   REASON FOR VISIT:  Follow-up for stage IVb high-grade B-cell lymphoma  PRIOR THERAPY: none  NGS Results: not done  CURRENT THERAPY: R-CEOP q21d x 3 Cycles  BRIEF ONCOLOGIC HISTORY:  Oncology History  High grade B-cell lymphoma ([place])  12/03/2021 Initial Diagnosis   High grade B-cell lymphoma ([place])   12/15/2021 -  Chemotherapy   Patient is on Treatment Plan : NON-HODGKIN'S LYMPHOMA R-CEOP q21d x 3 Cycles       CANCER STAGING:  Cancer Staging  High grade B-cell lymphoma ([place]) Staging form: Hodgkin and Non-Hodgkin Lymphoma, AJCC 8th Edition - Clinical stage from 12/03/2021: Stage IV (Diffuse large B-cell lymphoma) - Unsigned   INTERVAL HISTORY:  [name] [name], a 76 y.o. male, returns for routine follow-up and consideration for next cycle of chemotherapy. Khyree was last seen on 02/17/2022.  Due for cycle #5 of R-CEOP today.   Overall, he tells me he has been feeling pretty well. He reports fatigue and weakness in his legs up to his knees following his last treatment. He denies pain in his legs or falls. He denies diarrhea.   Overall, he feels ready for next cycle of chemo today.   REVIEW OF SYSTEMS:  Review of Systems  Constitutional:  Positive for fatigue. Negative for appetite change.  Respiratory:  Positive for shortness of breath.   Cardiovascular:  Positive for chest pain (8/10 L breast).  Gastrointestinal:  Negative for diarrhea.  Neurological:  Positive for extremity weakness (legs) and numbness (fingers).  Psychiatric/Behavioral:  Positive for sleep disturbance.   All other systems reviewed and are negative.   PAST MEDICAL/SURGICAL HISTORY:  Past Medical History:  Diagnosis Date   ALLERGIC RHINITIS 10/31/2007   Qualifier: Diagnosis of  By: Jenny Reichmann MD, [place] Oris     Allergy    CHOLECYSTECTOMY, HX OF 06/04/2007   Qualifier: Diagnosis of  By: Danny Lawless Vaughn, [place]     COLONIC POLYPS, HX OF 10/31/2007   Qualifier: Diagnosis of  By: Jenny Reichmann MD, [place] GRAFT, HX OF 06/04/2007   Qualifier: Diagnosis of  By: [place], [place]     CORONARY ARTERY DISEASE 06/04/2007   Qualifier: Diagnosis of  By: Danny Lawless CMA, [place]     Diverticulosis    Erectile dysfunction    Eye twitch    right eye since chilhood    HYPERLIPIDEMIA 06/04/2007   Qualifier: Diagnosis of  By: [place], [place]     Hypersomnolence    HYPERTENSION 06/04/2007   Qualifier: Diagnosis of  By: [place], [place]     Hypothyroidism    ISCHEMIC CARDIOMYOPATHY 06/04/2007   Qualifier: Diagnosis of  By: [place], [place]     Myocardial infarction ([place])    per patient , his occurred in 1998    Osteoarthritis    PLMD (periodic limb movement disorder)    Sleep apnea    no cpap' per patient , "i was checked for it and they said i didnt have it "    Vertigo    Vitamin D deficiency    Past Surgical History:  Procedure Laterality Date   BIOPSY  11/25/2021   Procedure: BIOPSY;  Surgeon: Jackquline Denmark, MD;  Location: [facility];  Service: Gastroenterology;;   CHOLECYSTECTOMY     COLONOSCOPY  COLONOSCOPY WITH PROPOFOL N/A 11/25/2021   Procedure: COLONOSCOPY WITH PROPOFOL;  Surgeon: Jackquline Denmark, MD;  Location: [facility];  Service: Gastroenterology;  Laterality: N/A;   CORONARY ARTERY BYPASS GRAFT     ESOPHAGOGASTRODUODENOSCOPY (EGD) WITH PROPOFOL N/A 11/25/2021   Procedure: ESOPHAGOGASTRODUODENOSCOPY (EGD) WITH PROPOFOL;  Surgeon: Jackquline Denmark, MD;  Location: [facility];  Service: Gastroenterology;  Laterality: N/A;   ICD IMPLANT N/A 01/30/2020   Procedure: ICD IMPLANT;  Surgeon: Thompson Grayer, MD;  Location: [facility];  Service: Cardiovascular;  Laterality: N/A;   IR IMAGING GUIDED PORT INSERTION  12/04/2021   KNEE ARTHROPLASTY Left 08/05/2017    Procedure: LEFT TOTAL KNEE ARTHROPLASTY WITH COMPUTER NAVIGATION;  Surgeon: Rod Can, MD;  Location: [facility];  Service: Orthopedics;  Laterality: Left;  Needs RNFA   PENILE PROSTHESIS IMPLANT     POLYPECTOMY  11/25/2021   Procedure: POLYPECTOMY;  Surgeon: Jackquline Denmark, MD;  Location: [facility];  Service: Gastroenterology;;   REPLACEMENT TOTAL KNEE  06/12/2013   spinal cyst     THORACOTOMY     left anterior; wound exploration and debridement   TONSILLECTOMY AND ADENOIDECTOMY     age 84    SOCIAL HISTORY:  Social History   Socioeconomic History   Marital status: Married    Spouse name: [name]   Number of children: 3   Years of education: Not on file   Highest education level: Not on file  Occupational History   Occupation: truck Education administrator: [name]  Tobacco Use   Smoking status: Former    Packs/day: 4.00    Years: 17.00    Total pack years: 68.00    Types: Cigarettes    Quit date: 05/07/1981    Years since quitting: 40.8    Passive exposure: Never   Smokeless tobacco: Never   Tobacco comments:    Pt states that he would let most of them "burn" pt states that he used anywhere between 4-5PPD  Vaping Use   Vaping Use: Never used  Substance and Sexual Activity   Alcohol use: Yes    Alcohol/week: 0.0 standard drinks of alcohol    Comment: rare   Drug use: No   Sexual activity: Not on file  Other Topics Concern   Not on file  Social History Narrative   Lives in [place] with spouse   Retired Administrator   Social Determinants of Health   Financial Resource Strain: [place]  (10/27/2021)   Overall Financial Resource Strain (CARDIA)    Difficulty of Paying Living Expenses: Not hard at all  Food Insecurity: No Food Insecurity (10/27/2021)   Hunger Vital Sign    Worried About Running Out of Food in the Last Year: Never true    Ran Out of Food in the Last Year: Never true  Transportation Needs: No Transportation Needs (10/27/2021)   PRAPARE -  Hydrologist (Medical): No    Lack of Transportation (Non-Medical): No  Physical Activity: Inactive (10/27/2021)   Exercise Vital Sign    Days of Exercise per Week: 0 days    Minutes of Exercise per Session: 0 min  Stress: No Stress Concern Present (10/27/2021)   [place]    Feeling of Stress : Not at all  Social Connections: Moderately Isolated (10/27/2021)   Social Connection and Isolation Panel [NHANES]    Frequency of Communication with Friends and Family: More than three times a  week    Frequency of Social Gatherings with Friends and Family: More than three times a week    Attends Religious Services: Never    Marine scientist or Organizations: No    Attends Archivist Meetings: Never    Marital Status: Married  Human resources officer Violence: Not At Risk (10/27/2021)   Humiliation, Afraid, Rape, and Kick questionnaire    Fear of Current or Ex-Partner: No    Emotionally Abused: No    Physically Abused: No    Sexually Abused: No    FAMILY HISTORY:  Family History  Problem Relation Age of Onset   Stomach cancer Mother        smokes   Lung cancer Father        chewed tobacco   Heart disease Brother        first MI at 66yo, now 42 for transplant list/ ICM   Pancreatic cancer Brother    Colon cancer Maternal Grandfather    COPD Son        was a smoker   Pancreatic cancer Maternal Uncle    Pancreatic cancer Cousin        mat side x 4   Esophageal cancer Neg Hx    Prostate cancer Neg Hx    Rectal cancer Neg Hx     CURRENT MEDICATIONS:  Current Outpatient Medications  Medication Sig Dispense Refill   allopurinol (ZYLOPRIM) 300 MG tablet Take 1 tablet (300 mg total) by mouth daily. 30 tablet 3   ARTIFICIAL TEAR SOLUTION OP Place 1 drop into both eyes daily as needed (dry eyes).     Ascorbic Acid (VITA-C PO) Take 1,000 mg by mouth daily.     b complex vitamins capsule  Take 1 capsule by mouth daily.     empagliflozin (JARDIANCE) 10 MG TABS tablet Take 10 mg by mouth daily.     ezetimibe (ZETIA) 10 MG tablet TAKE 1 TABLET EVERY DAY (Patient taking differently: Take by mouth every other day.) 90 tablet 3   feeding supplement (ENSURE ENLIVE / ENSURE PLUS) LIQD Take 237 mLs by mouth 3 (three) times daily between meals. 237 mL 12   furosemide (LASIX) 20 MG tablet Take 1 tablet (20 mg total) by mouth daily. 90 tablet 3   lidocaine-prilocaine (EMLA) cream Apply a small amount to port a cath site (do not rub in) and cover with plastic wrap 1 hour prior to infusion appointments 30 g 3   metoprolol succinate (TOPROL-XL) 25 MG 24 hr tablet Take 1 tablet (25 mg total) by mouth daily. Take with or immediately following a meal. 30 tablet 6   Multiple Vitamins-Minerals (MULTIVITAMIN WITH MINERALS) tablet Take 1 tablet by mouth daily. Centrum silver     prochlorperazine (COMPAZINE) 10 MG tablet Take 1 tablet (10 mg total) by mouth every 6 (six) hours as needed for nausea or vomiting. 30 tablet 3   rosuvastatin (CRESTOR) 5 MG tablet TAKE 1 TABLET EVERY OTHER DAY 45 tablet 10   spironolactone (ALDACTONE) 25 MG tablet TAKE 1/2 TABLET EVERY DAY 45 tablet 3   triamcinolone (NASACORT) 55 MCG/ACT AERO nasal inhaler Place 2 sprays into the nose daily. (Patient taking differently: Place 2 sprays into the nose daily as needed (allergies).) 3 Inhaler 3   VITAMIN D PO Take 1,000 Units by mouth daily.     warfarin (COUMADIN) 5 MG tablet TAKE 1 TABLET EVERY DAY AT 4 PM (Patient taking differently: Take 2.5-5 mg by mouth See admin instructions.  Taking 5 mg every day except on Friday taking 1/2 tablet = 2.5 mg) 90 tablet 4   No current facility-administered medications for this visit.    ALLERGIES:  Allergies  Allergen Reactions   Ace Inhibitors Cough   Statins Other (See Comments)    Muscle pains    PHYSICAL EXAM:  Performance status (ECOG): 0 - Asymptomatic  There were no vitals  filed for this visit. Wt Readings from Last 3 Encounters:  03/10/22 184 lb 8 oz (83.7 kg)  03/04/22 185 lb 12.8 oz (84.3 kg)  02/26/22 182 lb 3.2 oz (82.6 kg)   Physical Exam Vitals reviewed.  Constitutional:      Appearance: Normal appearance.  Cardiovascular:     Rate and Rhythm: Normal rate and regular rhythm.     Pulses: Normal pulses.     Heart sounds: Normal heart sounds.  Pulmonary:     Effort: Pulmonary effort is normal.     Breath sounds: Normal breath sounds.  Neurological:     General: No focal deficit present.     Mental Status: He is alert and oriented to person, place, and time.  Psychiatric:        Mood and Affect: Mood normal.        Behavior: Behavior normal.     LABORATORY DATA:  I have reviewed the labs as listed.     Latest Ref Rng & Units 02/26/2022    1:23 PM 02/17/2022    8:00 AM 01/27/2022    8:29 AM  CBC  WBC 4.0 - 10.5 K/uL 5.8  9.9  8.6   Hemoglobin 13.0 - 17.0 g/dL 11.0  11.4  11.9   Hematocrit 39.0 - 52.0 % 34.3  35.5  36.4   Platelets 150 - 400 K/uL 150  162  200       Latest Ref Rng & Units 02/26/2022    1:23 PM 02/17/2022    8:00 AM 01/27/2022    8:29 AM  CMP  Glucose 70 - 99 mg/dL 125  135  181   BUN 8 - 23 mg/dL 24  38  24   Creatinine 0.61 - 1.24 mg/dL 1.16  1.61  1.33   Sodium 135 - 145 mmol/L 135  135  134   Potassium 3.5 - 5.1 mmol/L 4.2  4.5  4.2   Chloride 98 - 111 mmol/L 99  103  100   CO2 22 - 32 mmol/L '27  25  26   ' Calcium 8.9 - 10.3 mg/dL 8.9  8.8  8.9   Total Protein 6.5 - 8.1 g/dL 6.9  6.4  6.3   Total Bilirubin 0.3 - 1.2 mg/dL 1.4  1.0  0.7   Alkaline Phos 38 - 126 U/L 97  67  77   AST 15 - 41 U/L '24  25  28   ' ALT 0 - 44 U/L 21  22  33     DIAGNOSTIC IMAGING:  I have independently reviewed the scans and discussed with the patient. CT ANGIO AO+BIFEM W & OR WO CONTRAST  Result Date: 03/03/2022 CLINICAL DATA:  Popliteal artery aneurysm, right calf numbness, recent diagnosis lymphoma EXAM: CT ANGIOGRAPHY OF ABDOMINAL  AORTA WITH ILIOFEMORAL RUNOFF TECHNIQUE: Multidetector CT imaging of the abdomen, pelvis and lower extremities was performed using the standard protocol during bolus administration of intravenous contrast. Multiplanar CT image reconstructions and MIPs were obtained to evaluate the vascular anatomy. RADIATION DOSE REDUCTION: This exam was performed according to the departmental dose-optimization program  which includes automated exposure control, adjustment of the mA and/or kV according to patient size and/or use of iterative reconstruction technique. CONTRAST:  120m OMNIPAQUE IOHEXOL 350 MG/ML SOLN COMPARISON:  PET-CT 02/05/2022 and previous FINDINGS: VASCULAR Aorta: Mild scattered calcified atheromatous plaque. No aneurysm, dissection, or stenosis. Celiac: Patent without evidence of aneurysm, dissection, vasculitis or significant stenosis. SMA: Patent without evidence of aneurysm, dissection, vasculitis or significant stenosis. Renals: 3 left renal arteries, inferior dominant, wall widely patent. Single right, with calcified ostial plaque resulting in short segment stenosis of at least mild severity, patent distally. IMA: Patent without evidence of aneurysm, dissection, vasculitis or significant stenosis. RIGHT Lower Extremity Inflow: Common iliac mildly ectatic up to 1.5 cm diameter, scattered nonocclusive calcified plaque Internal iliac widely patent External iliac mildly tortuous, patent Outflow: Common femoral scattered calcified plaque without stenosis Deep femoral branches patent SFA scattered calcified plaque without stenosis. Popliteal artery visualization partially degraded by streak artifact from knee arthroplasty hardware. There is a fusiform aneurysm measuring at least 4.7 cm diameter involving the popliteal artery below the knee, containing a moderate amount of nonocclusive mural thrombus. Runoff: Anterior tibial artery is atheromatous, diminutive but patent across the ankle as dorsalis pedis Peroneal  artery patent distally Posterior tibial artery mildly atheromatous but patent, diminutive below the ankle LEFT Lower Extremity Inflow: Common iliac minimally atheromatous, patent Internal iliac minimally atheromatous, patent External iliac mildly tortuous, patent Outflow: Common femoral mildly atheromatous, patent Deep femoral branches patent SFA mildly atheromatous, widely patent Popliteal artery imaging partially degraded by streak artifact from knee arthroplasty hardware. No definite stenosis or aneurysm Runoff: Posterior tibial and peroneal artery are mildly disease, patent Anterior tibial is very diminutive at the level of the distal calf with non opacification of the dorsalis pedis Veins: No obvious venous abnormality within the limitations of this arterial phase study. Review of the MIP images confirms the above findings. NON-VASCULAR Lower chest: Transvenous pacing leads partially visualized. Heart size normal. Coronary calcifications. No pleural or pericardial effusion. Hepatobiliary: No focal liver abnormality is seen. Status post cholecystectomy. No biliary dilatation. Pancreas: Unremarkable Spleen: Normal size, no focal lesion Adrenals/Urinary Tract: Adrenal glands are unremarkable. Kidneys are normal, without renal calculi, focal lesion, or hydronephrosis. Bladder is unremarkable. Stomach/Bowel: Stomach is incompletely distended. Small bowel decompressed. Normal appendix. The colon is incompletely distended. Scattered distal descending and sigmoid diverticula without significant adjacent inflammatory change. Lymphatic: No abdominal or pelvic adenopathy. Reproductive: Penile prosthesis components.  Prostate unremarkable. Other: Bilateral pelvic phleboliths.  No ascites.  No free air. Musculoskeletal: Subacute L3 superior endplate compression fracture deformity with less than 20% loss of height anteriorly, probably present on the prior study of 02/05/2022 but new since 10/31/2021. spondylitic changes in  the lower lumbar spine. Bilateral knee arthroplasty hardware projects in expected location. IMPRESSION: 1. 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 Three-vessel right tibial runoff, peroneal and posterior tibial runoff on the left. 3. Aortoiliac atherosclerosis (ICD10-170.0) without significant occlusive inflow disease 4. Subacute L3 compression fracture deformity. 5. Sigmoid diverticulosis Electronically Signed   By: DLucrezia EuropeM.D.   On: 03/03/2022 14:34     ASSESSMENT:  Stage IVb high-grade B-cell lymphoma: - He had unintentional weight loss of 20 pounds since September 2022.  Weight has been stable for the last 2 months.  He had multiple family members with pancreatic cancer. - CT AP with contrast (10/31/2021): Abnormal nodular thickening of the right anterior pararenal fascia, suspicious for atypical malignancy.  No evidence of pancreatic malignancy.  Irregular wall  thickening of the proximal stomach.  No ascites or peritoneal nodularity. - Biopsy of nodular thickening (11/17/2021): High-grade B-cell lymphoma with Burkitt-like features.  Ki-67 100%. 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  Neoplastic lymphocytes positive for CD20, CD10, Bcl-2, BCL6 and mum 1.  Ki-67 more than 95%.  H pylori IHC negative. - High-grade lymphoma panel (11/17/2021): Negative for BCL6, MYC, BCL2 rearrangements, negative for MYC amplification, t(8:14) not detected. - High risk features for CNS disease: Age more than 42, stage III/IV, extranodal involvement more than 1 site.  Based on 3 points, intermediate risk.  No clinical signs of CNS involvement. - IPI: High intermediate with 3 points (age more than 60, stage III/IV, extranodal involvement more than 1 site) - R-CEOP cycle 1 started on 12/15/2021 - Hospitalized for 2 days after cycle 2 from 01/19/2022 through 01/21/2022 with pneumonia.    Social/family history: - He has AICD  placed in June 2021 secondary to CHF. - He is a retired English as a second language teacher.  He worked at [employer] in [place].  He had exposure to some chemicals.  He quit smoking more than 40 years ago.  He drinks mostly beer and occasionally whiskey at bedtime.    Ischemic cardiomyopathy/CHF/apical thrombus: - He is on Coumadin.  Will check PT/INR today. - Last seen by Dr. [name] on 11/06/2021.   -Last echo on 12/27/2020 with EF 30-35%.  Limited visualization of endocardium.  LV has moderately decreased function.   PLAN:  Stage IVb high-grade B-cell lymphoma: - PET scan on 01/16/2022, after 3 cycles showed Deauville 1 response. - I have dose reduced cycle 4. - He lost about 3.5 pounds.  Overall he tolerated it better than the prior cycle.  Right foot numbness is stable.  No major GI symptoms.  No falls or infections. - Blood pressure is better today.  We will continue to hold valsartan. - We will proceed with cycle 5 today with dose reductions of cyclophosphamide, vincristine and etoposide. - We will schedule him for symptom management clinic in 1 week with fluids.  RTC 3 weeks for follow-up for final cycle.  2.  TLS prophylaxis: - Uric acid is 7.1.  We have discontinued allopurinol.  3.  Acute kidney injury on CKD: - Creatinine is 1.16 and better today.   Orders placed this encounter:  No orders of the defined types were placed in this encounter.    Derek Jack, MD [place] [phone]   I, Thana Ates, am acting as a scribe for Dr. Derek Jack.  I, Derek Jack MD, have reviewed the above documentation for accuracy and completeness, and I agree with the above.

## 2022-03-10 NOTE — Patient Instructions (Signed)
[facility] Discharge Instructions   You were seen and examined today by Dr. Delton Coombes.  He reviewed the results of your lab work which are normal/stable.   We will proceed with your treatment today.   We will bring you back next week for lab recheck and IV fluids.   Return as scheduled.    Thank you for choosing [place] at [facility] to provide your oncology and hematology care.  To afford each patient quality time with our provider, please arrive at least 15 minutes before your scheduled appointment time.   If you have a lab appointment with the [facility] please come in thru the Main Entrance and check in at the main information desk.  You need to re-schedule your appointment should you arrive 10 or more minutes late.  We strive to give you quality time with our providers, and arriving late affects you and other patients whose appointments are after yours.  Also, if you no show three or more times for appointments you may be dismissed from the clinic at the providers discretion.     Again, thank you for choosing [facility].  Our hope is that these requests will decrease the amount of time that you wait before being seen by our physicians.       _____________________________________________________________  Should you have questions after your visit to [facility], please contact our office at [phone] and follow the prompts.  Our office hours are 8:00 a.m. and 4:30 p.m. Monday - Friday.  Please note that voicemails left after 4:00 p.m. may not be returned until the following business day.  We are closed weekends and major holidays.  You do have access to a nurse 24-7, just call the main number to the clinic [phone] and do not press any options, hold on the line and a nurse will answer the phone.    For prescription refill requests, have your pharmacy contact our office and allow 72  hours.    Due to Covid, you will need to wear a mask upon entering the hospital. If you do not have a mask, a mask will be given to you at the Main Entrance upon arrival. For doctor visits, patients may have 1 support person age 3 or older with them. For treatment visits, patients can not have anyone with them due to social distancing guidelines and our immunocompromised population.

## 2022-03-10 NOTE — Patient Instructions (Addendum)
[place] [place]  Discharge Instructions: Thank you for choosing [facility] to provide your oncology and hematology care.  If you have a lab appointment with the [facility], please come in thru the Main Entrance and check in at the main information desk.  Wear comfortable clothing and clothing appropriate for easy access to any Portacath or PICC line.   We strive to give you quality time with your provider. You may need to reschedule your appointment if you arrive late (15 or more minutes).  Arriving late affects you and other patients whose appointments are after yours.  Also, if you miss three or more appointments without notifying the office, you may be dismissed from the clinic at the provider's discretion.      For prescription refill requests, have your pharmacy contact our office and allow 72 hours for refills to be completed.    Today you received the following chemotherapy and/or immunotherapy agents : Rituximab, Oncovin, Cytoxan, Etoposide      To help prevent nausea and vomiting after your treatment, we encourage you to take your nausea medication as directed.  BELOW ARE SYMPTOMS THAT SHOULD BE REPORTED IMMEDIATELY: *FEVER GREATER THAN 100.4 F (38 C) OR HIGHER *CHILLS OR SWEATING *NAUSEA AND VOMITING THAT IS NOT CONTROLLED WITH YOUR NAUSEA MEDICATION *UNUSUAL SHORTNESS OF BREATH *UNUSUAL BRUISING OR BLEEDING *URINARY PROBLEMS (pain or burning when urinating, or frequent urination) *BOWEL PROBLEMS (unusual diarrhea, constipation, pain near the anus) TENDERNESS IN MOUTH AND THROAT WITH OR WITHOUT PRESENCE OF ULCERS (sore throat, sores in mouth, or a toothache) UNUSUAL RASH, SWELLING OR PAIN  UNUSUAL VAGINAL DISCHARGE OR ITCHING   Items with * indicate a potential emergency and should be followed up as soon as possible or go to the Emergency Department if any problems should occur.  Please show the CHEMOTHERAPY ALERT CARD or IMMUNOTHERAPY ALERT  CARD at check-in to the Emergency Department and triage nurse.  Should you have questions after your visit or need to cancel or reschedule your appointment, please contact [facility] [phone]  and follow the prompts.  Office hours are 8:00 a.m. to 4:30 p.m. Monday - Friday. Please note that voicemails left after 4:00 p.m. may not be returned until the following business day.  We are closed weekends and major holidays. You have access to a nurse at all times for urgent questions. Please call the main number to the clinic [phone] and follow the prompts.  For any non-urgent questions, you may also contact your provider using MyChart. We now offer e-Visits for anyone 77 and older to request care online for non-urgent symptoms. For details visit [email or web address].   Also download the MyChart app! Go to the app store, search "MyChart", open the app, select [facility], and log in with your MyChart username and password.  Masks are optional in the cancer centers. If you would like for your care team to wear a mask while they are taking care of you, please let them know. For doctor visits, patients may have with them one support person who is at least 77 years old. At this time, visitors are not allowed in the infusion area.  Rituximab; Hyaluronidase injection What is this medication? RITUXIMAB; HYALURONIDASE (ri TUX i mab / hye al ur ON i dase) is used to treat non-Hodgkin lymphoma and chronic lymphocytic leukemia. Rituximab is a monoclonal antibody. Hyaluronidase is used to improve the effects of rituximab. This medicine may be used for other purposes; ask  your health care provider or pharmacist if you have questions. COMMON BRAND NAME(S): Rituxan Hycela What should I tell my care team before I take this medication? They need to know if you have any of these conditions: heart disease infection (especially a virus infection such as hepatitis B, chickenpox, cold sores,  or herpes) immune system problems irregular heartbeat kidney disease lung or breathing disease, like asthma recently received or scheduled to receive a vaccine an unusual or allergic reaction to rituximab, rituximab/hyaluronidase, mouse proteins, other medicines, foods, dyes, or preservatives pregnant or trying to get pregnant breast-feeding How should I use this medication? This medicine is for injection under the skin. It is given by a health care professional in a hospital or clinic setting. A special MedGuide will be given to you before each treatment. Be sure to read this information carefully each time. Talk to your pediatrician regarding the use of this medicine in children. Special care may be needed. Overdosage: If you think you have taken too much of this medicine contact a poison control center or emergency room at once. NOTE: This medicine is only for you. Do not share this medicine with others. What if I miss a dose? It is important not to miss your dose. Call your doctor or health care professional if you are unable to keep an appointment. What may interact with this medication? This medicine may interact with the following medications: cisplatin live virus vaccines This list may not describe all possible interactions. Give your health care provider a list of all the medicines, herbs, non-prescription drugs, or dietary supplements you use. Also tell them if you smoke, drink alcohol, or use illegal drugs. Some items may interact with your medicine. What should I watch for while using this medication? Your condition will be monitored carefully while you are receiving this medicine. You may need blood work done while you are taking this medicine. This medicine can cause serious allergic reactions. To reduce your risk you may need to take medicine before treatment with this medicine. Take your medicine as directed. In some patients, this medicine may cause a serious brain infection  that may cause death. If you have any problems seeing, thinking, speaking, walking, or standing, tell your doctor right away. If you cannot reach your doctor, urgently seek other source of medical care. Call your doctor or health care professional for advice if you get a fever, chills or sore throat, or other symptoms of a cold or flu. Do not treat yourself. This drug decreases your body's ability to fight infections. Try to avoid being around people who are sick. Do not become pregnant while taking this medicine or for 12 months after stopping it. Women should inform their doctor if they wish to become pregnant or think they might be pregnant. There is a potential for serious side effects to an unborn child. Talk to your health care professional or pharmacist for more information. Do not breast-feed an infant while taking this medicine or for at least 6 months after stopping it. What side effects may I notice from receiving this medication? Side effects that you should report to your doctor or health care professional as soon as possible: allergic reactions like skin rash, itching or hives; swelling of the face, lips, or tongue breathing problems chest pain changes in vision diarrhea dizziness headache with fever, neck stiffness, sensitivity to light, nausea, or confusion fast, irregular heartbeat loss of memory low blood counts - this medicine may decrease the number of white blood  cells, red blood cells and platelets. You may be at increased risk for infections and bleeding. mouth sores problems with balance, talking, or walking redness, blistering, peeling or loosening of the skin, including inside the mouth signs of infection - fever or chills, cough, sore throat, pain or difficulty passing urine signs and symptoms of kidney injury like trouble passing urine or change in the amount of urine signs and symptoms of liver injury like dark yellow or brown urine; general ill feeling or flu-like  symptoms; light-colored stools; loss of appetite; nausea; right upper belly pain; unusually weak or tired; yellowing of the eyes or skin stomach pain swelling of the ankles, feet, hands unusual bleeding or bruising vomiting Side effects that usually do not require medical attention (report these to your doctor or health care professional if they continue or are bothersome): headache joint pain muscle cramps or muscle pain nausea pain, redness, or irritation at site where injected tiredness This list may not describe all possible side effects. Call your doctor for medical advice about side effects. You may report side effects to FDA at 1-800-FDA-1088. Where should I keep my medication? This drug is given in a hospital or clinic and will not be stored at home. NOTE: This sheet is a summary. It may not cover all possible information. If you have questions about this medicine, talk to your doctor, pharmacist, or health care provider.  2023 Elsevier/Gold Standard (2017-07-09 00:00:00)  Cyclophosphamide Injection What is this medication? CYCLOPHOSPHAMIDE (sye kloe FOSS fa mide) is a chemotherapy drug. It slows the growth of cancer cells. This medicine is used to treat many types of cancer like lymphoma, myeloma, leukemia, breast cancer, and ovarian cancer, to name a few. This medicine may be used for other purposes; ask your health care provider or pharmacist if you have questions. COMMON BRAND NAME(S): Cyclophosphamide, Cytoxan, Neosar What should I tell my care team before I take this medication? They need to know if you have any of these conditions: heart disease history of irregular heartbeat infection kidney disease liver disease low blood counts, like white cells, platelets, or red blood cells on hemodialysis recent or ongoing radiation therapy scarring or thickening of the lungs trouble passing urine an unusual or allergic reaction to cyclophosphamide, other medicines, foods,  dyes, or preservatives pregnant or trying to get pregnant breast-feeding How should I use this medication? This drug is usually given as an injection into a vein or muscle or by infusion into a vein. It is administered in a hospital or clinic by a specially trained health care professional. Talk to your pediatrician regarding the use of this medicine in children. Special care may be needed. Overdosage: If you think you have taken too much of this medicine contact a poison control center or emergency room at once. NOTE: This medicine is only for you. Do not share this medicine with others. What if I miss a dose? It is important not to miss your dose. Call your doctor or health care professional if you are unable to keep an appointment. What may interact with this medication? amphotericin B azathioprine certain antivirals for HIV or hepatitis certain medicines for blood pressure, heart disease, irregular heart beat certain medicines that treat or prevent blood clots like warfarin certain other medicines for cancer cyclosporine etanercept indomethacin medicines that relax muscles for surgery medicines to increase blood counts metronidazole This list may not describe all possible interactions. Give your health care provider a list of all the medicines, herbs, non-prescription drugs, or  dietary supplements you use. Also tell them if you smoke, drink alcohol, or use illegal drugs. Some items may interact with your medicine. What should I watch for while using this medication? Your condition will be monitored carefully while you are receiving this medicine. You may need blood work done while you are taking this medicine. Drink water or other fluids as directed. Urinate often, even at night. Some products may contain alcohol. Ask your health care professional if this medicine contains alcohol. Be sure to tell all health care professionals you are taking this medicine. Certain medicines, like  metronidazole and disulfiram, can cause an unpleasant reaction when taken with alcohol. The reaction includes flushing, headache, nausea, vomiting, sweating, and increased thirst. The reaction can last from 30 minutes to several hours. Do not become pregnant while taking this medicine or for 1 year after stopping it. Women should inform their health care professional if they wish to become pregnant or think they might be pregnant. Men should not father a child while taking this medicine and for 4 months after stopping it. There is potential for serious side effects to an unborn child. Talk to your health care professional for more information. Do not breast-feed an infant while taking this medicine or for 1 week after stopping it. This medicine has caused ovarian failure in some women. This medicine may make it more difficult to get pregnant. Talk to your health care professional if you are concerned about your fertility. This medicine has caused decreased sperm counts in some men. This may make it more difficult to father a child. Talk to your health care professional if you are concerned about your fertility. Call your health care professional for advice if you get a fever, chills, or sore throat, or other symptoms of a cold or flu. Do not treat yourself. This medicine decreases your body's ability to fight infections. Try to avoid being around people who are sick. Avoid taking medicines that contain aspirin, acetaminophen, ibuprofen, naproxen, or ketoprofen unless instructed by your health care professional. These medicines may hide a fever. Talk to your health care professional about your risk of cancer. You may be more at risk for certain types of cancer if you take this medicine. If you are going to need surgery or other procedure, tell your health care professional that you are using this medicine. Be careful brushing or flossing your teeth or using a toothpick because you may get an infection or  bleed more easily. If you have any dental work done, tell your dentist you are receiving this medicine. What side effects may I notice from receiving this medication? Side effects that you should report to your doctor or health care professional as soon as possible: allergic reactions like skin rash, itching or hives, swelling of the face, lips, or tongue breathing problems nausea, vomiting signs and symptoms of bleeding such as bloody or black, tarry stools; red or dark brown urine; spitting up blood or brown material that looks like coffee grounds; red spots on the skin; unusual bruising or bleeding from the eyes, gums, or nose signs and symptoms of heart failure like fast, irregular heartbeat, sudden weight gain; swelling of the ankles, feet, hands signs and symptoms of infection like fever; chills; cough; sore throat; pain or trouble passing urine signs and symptoms of kidney injury like trouble passing urine or change in the amount of urine signs and symptoms of liver injury like dark yellow or brown urine; general ill feeling or flu-like symptoms; light-colored  stools; loss of appetite; nausea; right upper belly pain; unusually weak or tired; yellowing of the eyes or skin Side effects that usually do not require medical attention (report to your doctor or health care professional if they continue or are bothersome): confusion decreased hearing diarrhea facial flushing hair loss headache loss of appetite missed menstrual periods signs and symptoms of low red blood cells or anemia such as unusually weak or tired; feeling faint or lightheaded; falls skin discoloration This list may not describe all possible side effects. Call your doctor for medical advice about side effects. You may report side effects to FDA at 1-800-FDA-1088. Where should I keep my medication? This drug is given in a hospital or clinic and will not be stored at home. NOTE: This sheet is a summary. It may not cover all  possible information. If you have questions about this medicine, talk to your doctor, pharmacist, or health care provider.  2023 Elsevier/Gold Standard (2021-06-27 00:00:00)  Vincristine injection What is this medication? VINCRISTINE (vin KRIS teen) is a chemotherapy drug. It slows the growth of cancer cells. This medicine is used to treat many types of cancer like Hodgkin's disease, leukemia, non-Hodgkin's lymphoma, neuroblastoma (brain cancer), rhabdomyosarcoma, and Wilms' tumor. This medicine may be used for other purposes; ask your health care provider or pharmacist if you have questions. COMMON BRAND NAME(S): Oncovin, Vincasar PFS What should I tell my care team before I take this medication? They need to know if you have any of these conditions: blood disorders gout infection (especially chickenpox, cold sores, or herpes) kidney disease liver disease lung disease nervous system disease like Charcot-Marie-Tooth (CMT) recent or ongoing radiation therapy an unusual or allergic reaction to vincristine, other chemotherapy agents, other medicines, foods, dyes, or preservatives pregnant or trying to get pregnant breast-feeding How should I use this medication? This drug is given as an infusion into a vein. It is administered in a hospital or clinic by a specially trained health care professional. If you have pain, swelling, burning, or any unusual feeling around the site of your injection, tell your health care professional right away. Talk to your pediatrician regarding the use of this medicine in children. While this drug may be prescribed for selected conditions, precautions do apply. Overdosage: If you think you have taken too much of this medicine contact a poison control center or emergency room at once. NOTE: This medicine is only for you. Do not share this medicine with others. What if I miss a dose? It is important not to miss your dose. Call your doctor or health care  professional if you are unable to keep an appointment. What may interact with this medication? certain medicines for fungal infections like itraconazole, ketoconazole, posaconazole, voriconazole certain medicines for seizures like phenytoin This list may not describe all possible interactions. Give your health care provider a list of all the medicines, herbs, non-prescription drugs, or dietary supplements you use. Also tell them if you smoke, drink alcohol, or use illegal drugs. Some items may interact with your medicine. What should I watch for while using this medication? This drug may make you feel generally unwell. This is not uncommon, as chemotherapy can affect healthy cells as well as cancer cells. Report any side effects. Continue your course of treatment even though you feel ill unless your doctor tells you to stop. You may need blood work done while you are taking this medicine. This medicine will cause constipation. Try to have a bowel movement at least every 2  to 3 days. If you do not have a bowel movement for 3 days, call your doctor or health care professional. In some cases, you may be given additional medicines to help with side effects. Follow all directions for their use. Do not become pregnant while taking this medicine. Women should inform their doctor if they wish to become pregnant or think they might be pregnant. There is a potential for serious side effects to an unborn child. Talk to your health care professional or pharmacist for more information. Do not breast-feed an infant while taking this medicine. This medicine may make it more difficult to get pregnant or to father a child. Talk to your healthcare professional if you are concerned about your fertility. What side effects may I notice from receiving this medication? Side effects that you should report to your doctor or health care professional as soon as possible: allergic reactions like skin rash, itching or hives,  swelling of the face, lips, or tongue breathing problems confusion or changes in emotions or moods constipation cough mouth sores muscle weakness nausea and vomiting pain, swelling, redness or irritation at the injection site pain, tingling, numbness in the hands or feet problems with balance, talking, walking seizures stomach pain trouble passing urine or change in the amount of urine Side effects that usually do not require medical attention (report to your doctor or health care professional if they continue or are bothersome): diarrhea hair loss jaw pain loss of appetite This list may not describe all possible side effects. Call your doctor for medical advice about side effects. You may report side effects to FDA at 1-800-FDA-1088. Where should I keep my medication? This drug is given in a hospital or clinic and will not be stored at home. NOTE: This sheet is a summary. It may not cover all possible information. If you have questions about this medicine, talk to your doctor, pharmacist, or health care provider.  2023 Elsevier/Gold Standard (2021-06-27 00:00:00)  Etoposide, VP-16 injection What is this medication? ETOPOSIDE, VP-16 (e toe POE side) is a chemotherapy drug. It is used to treat testicular cancer, lung cancer, and other cancers. This medicine may be used for other purposes; ask your health care provider or pharmacist if you have questions. COMMON BRAND NAME(S): Etopophos, Toposar, VePesid What should I tell my care team before I take this medication? They need to know if you have any of these conditions: infection kidney disease liver disease low blood counts, like low white cell, platelet, or red cell counts an unusual or allergic reaction to etoposide, other medicines, foods, dyes, or preservatives pregnant or trying to get pregnant breast-feeding How should I use this medication? This medicine is for infusion into a vein. It is administered in a hospital or  clinic by a specially trained health care professional. Talk to your pediatrician regarding the use of this medicine in children. Special care may be needed. Overdosage: If you think you have taken too much of this medicine contact a poison control center or emergency room at once. NOTE: This medicine is only for you. Do not share this medicine with others. What if I miss a dose? It is important not to miss your dose. Call your doctor or health care professional if you are unable to keep an appointment. What may interact with this medication? This medicine may interact with the following medications: warfarin This list may not describe all possible interactions. Give your health care provider a list of all the medicines, herbs, non-prescription drugs,  or dietary supplements you use. Also tell them if you smoke, drink alcohol, or use illegal drugs. Some items may interact with your medicine. What should I watch for while using this medication? Visit your doctor for checks on your progress. This drug may make you feel generally unwell. This is not uncommon, as chemotherapy can affect healthy cells as well as cancer cells. Report any side effects. Continue your course of treatment even though you feel ill unless your doctor tells you to stop. In some cases, you may be given additional medicines to help with side effects. Follow all directions for their use. Call your doctor or health care professional for advice if you get a fever, chills or sore throat, or other symptoms of a cold or flu. Do not treat yourself. This drug decreases your body's ability to fight infections. Try to avoid being around people who are sick. This medicine may increase your risk to bruise or bleed. Call your doctor or health care professional if you notice any unusual bleeding. Talk to your doctor about your risk of cancer. You may be more at risk for certain types of cancers if you take this medicine. Do not become pregnant  while taking this medicine or for at least 6 months after stopping it. Women should inform their doctor if they wish to become pregnant or think they might be pregnant. Women of child-bearing potential will need to have a negative pregnancy test before starting this medicine. There is a potential for serious side effects to an unborn child. Talk to your health care professional or pharmacist for more information. Do not breast-feed an infant while taking this medicine. Men must use a latex condom during sexual contact with a woman while taking this medicine and for at least 4 months after stopping it. A latex condom is needed even if you have had a vasectomy. Contact your doctor right away if your partner becomes pregnant. Do not donate sperm while taking this medicine and for at least 4 months after you stop taking this medicine. Men should inform their doctors if they wish to father a child. This medicine may lower sperm counts. What side effects may I notice from receiving this medication? Side effects that you should report to your doctor or health care professional as soon as possible: allergic reactions like skin rash, itching or hives, swelling of the face, lips, or tongue low blood counts - this medicine may decrease the number of white blood cells, red blood cells, and platelets. You may be at increased risk for infections and bleeding nausea, vomiting redness, blistering, peeling or loosening of the skin, including inside the mouth signs and symptoms of infection like fever; chills; cough; sore throat; pain or trouble passing urine signs and symptoms of low red blood cells or anemia such as unusually weak or tired; feeling faint or lightheaded; falls; breathing problems unusual bruising or bleeding Side effects that usually do not require medical attention (report to your doctor or health care professional if they continue or are bothersome): changes in taste diarrhea hair loss loss of  appetite mouth sores This list may not describe all possible side effects. Call your doctor for medical advice about side effects. You may report side effects to FDA at 1-800-FDA-1088. Where should I keep my medication? This drug is given in a hospital or clinic and will not be stored at home. NOTE: This sheet is a summary. It may not cover all possible information. If you have questions about this  medicine, talk to your doctor, pharmacist, or health care provider.  2023 Elsevier/Gold Standard (2021-06-27 00:00:00)

## 2022-03-11 ENCOUNTER — Inpatient Hospital Stay: Payer: No Typology Code available for payment source

## 2022-03-11 VITALS — BP 124/71 | HR 61 | Temp 96.6°F | Resp 18

## 2022-03-11 DIAGNOSIS — Z5111 Encounter for antineoplastic chemotherapy: Secondary | ICD-10-CM | POA: Diagnosis not present

## 2022-03-11 DIAGNOSIS — C8510 Unspecified B-cell lymphoma, unspecified site: Secondary | ICD-10-CM

## 2022-03-11 MED ORDER — SODIUM CHLORIDE 0.9 % IV SOLN
80.0000 mg | Freq: Once | INTRAVENOUS | Status: AC
  Administered 2022-03-11: 80 mg via INTRAVENOUS
  Filled 2022-03-11: qty 4

## 2022-03-11 MED ORDER — HEPARIN SOD (PORK) LOCK FLUSH 100 UNIT/ML IV SOLN: 500.0000 [IU] | Freq: Once | INTRAVENOUS | Status: DC | PRN

## 2022-03-11 MED ORDER — SODIUM CHLORIDE 0.9% FLUSH: 10.0000 mL | INTRAVENOUS | Status: DC | PRN

## 2022-03-11 MED ORDER — SODIUM CHLORIDE 0.9 % IV SOLN: Freq: Once | INTRAVENOUS | Status: AC

## 2022-03-11 MED ORDER — PROCHLORPERAZINE MALEATE 10 MG PO TABS
10.0000 mg | ORAL_TABLET | Freq: Once | ORAL | Status: AC
  Administered 2022-03-11: 10 mg via ORAL
  Filled 2022-03-11: qty 1

## 2022-03-11 NOTE — Progress Notes (Signed)
Patient tolerated chemotherapy with no complaints voiced.  Side effects with management reviewed with understanding verbalized.  Port site clean and dry with no bruising or swelling noted at site.  Good blood return noted before and after administration of chemotherapy.  Dressing intact.   Patient left in satisfactory condition with VSS and no s/s of distress noted.  

## 2022-03-11 NOTE — Patient Instructions (Signed)
[name] [name]  Discharge Instructions: Thank you for choosing [facility] to provide your oncology and hematology care.  If you have a lab appointment with the [facility], please come in thru the Main Entrance and check in at the main information desk.  Wear comfortable clothing and clothing appropriate for easy access to any Portacath or PICC line.   We strive to give you quality time with your provider. You may need to reschedule your appointment if you arrive late (15 or more minutes).  Arriving late affects you and other patients whose appointments are after yours.  Also, if you miss three or more appointments without notifying the office, you may be dismissed from the clinic at the provider's discretion.      For prescription refill requests, have your pharmacy contact our office and allow 72 hours for refills to be completed.    Today you received the following chemotherapy and/or immunotherapy agents etoposide.  Etoposide, VP-16 injection What is this medication? ETOPOSIDE, VP-16 (e toe POE side) is a chemotherapy drug. It is used to treat testicular cancer, lung cancer, and other cancers. This medicine may be used for other purposes; ask your health care provider or pharmacist if you have questions. COMMON BRAND NAME(S): Etopophos, Toposar, VePesid What should I tell my care team before I take this medication? They need to know if you have any of these conditions: infection kidney disease liver disease low blood counts, like low white cell, platelet, or red cell counts an unusual or allergic reaction to etoposide, other medicines, foods, dyes, or preservatives pregnant or trying to get pregnant breast-feeding How should I use this medication? This medicine is for infusion into a vein. It is administered in a hospital or clinic by a specially trained health care professional. Talk to your pediatrician regarding the use of this medicine in  children. Special care may be needed. Overdosage: If you think you have taken too much of this medicine contact a poison control center or emergency room at once. NOTE: This medicine is only for you. Do not share this medicine with others. What if I miss a dose? It is important not to miss your dose. Call your doctor or health care professional if you are unable to keep an appointment. What may interact with this medication? This medicine may interact with the following medications: warfarin This list may not describe all possible interactions. Give your health care provider a list of all the medicines, herbs, non-prescription drugs, or dietary supplements you use. Also tell them if you smoke, drink alcohol, or use illegal drugs. Some items may interact with your medicine. What should I watch for while using this medication? Visit your doctor for checks on your progress. This drug may make you feel generally unwell. This is not uncommon, as chemotherapy can affect healthy cells as well as cancer cells. Report any side effects. Continue your course of treatment even though you feel ill unless your doctor tells you to stop. In some cases, you may be given additional medicines to help with side effects. Follow all directions for their use. Call your doctor or health care professional for advice if you get a fever, chills or sore throat, or other symptoms of a cold or flu. Do not treat yourself. This drug decreases your body's ability to fight infections. Try to avoid being around people who are sick. This medicine may increase your risk to bruise or bleed. Call your doctor or health care professional  if you notice any unusual bleeding. Talk to your doctor about your risk of cancer. You may be more at risk for certain types of cancers if you take this medicine. Do not become pregnant while taking this medicine or for at least 6 months after stopping it. Women should inform their doctor if they wish to  become pregnant or think they might be pregnant. Women of child-bearing potential will need to have a negative pregnancy test before starting this medicine. There is a potential for serious side effects to an unborn child. Talk to your health care professional or pharmacist for more information. Do not breast-feed an infant while taking this medicine. Men must use a latex condom during sexual contact with a woman while taking this medicine and for at least 4 months after stopping it. A latex condom is needed even if you have had a vasectomy. Contact your doctor right away if your partner becomes pregnant. Do not donate sperm while taking this medicine and for at least 4 months after you stop taking this medicine. Men should inform their doctors if they wish to father a child. This medicine may lower sperm counts. What side effects may I notice from receiving this medication? Side effects that you should report to your doctor or health care professional as soon as possible: allergic reactions like skin rash, itching or hives, swelling of the face, lips, or tongue low blood counts - this medicine may decrease the number of white blood cells, red blood cells, and platelets. You may be at increased risk for infections and bleeding nausea, vomiting redness, blistering, peeling or loosening of the skin, including inside the mouth signs and symptoms of infection like fever; chills; cough; sore throat; pain or trouble passing urine signs and symptoms of low red blood cells or anemia such as unusually weak or tired; feeling faint or lightheaded; falls; breathing problems unusual bruising or bleeding Side effects that usually do not require medical attention (report to your doctor or health care professional if they continue or are bothersome): changes in taste diarrhea hair loss loss of appetite mouth sores This list may not describe all possible side effects. Call your doctor for medical advice about side  effects. You may report side effects to FDA at 1-800-FDA-1088. Where should I keep my medication? This drug is given in a hospital or clinic and will not be stored at home. NOTE: This sheet is a summary. It may not cover all possible information. If you have questions about this medicine, talk to your doctor, pharmacist, or health care provider.  2023 Elsevier/Gold Standard (2021-06-27 00:00:00)       To help prevent nausea and vomiting after your treatment, we encourage you to take your nausea medication as directed.  BELOW ARE SYMPTOMS THAT SHOULD BE REPORTED IMMEDIATELY: *FEVER GREATER THAN 100.4 F (38 C) OR HIGHER *CHILLS OR SWEATING *NAUSEA AND VOMITING THAT IS NOT CONTROLLED WITH YOUR NAUSEA MEDICATION *UNUSUAL SHORTNESS OF BREATH *UNUSUAL BRUISING OR BLEEDING *URINARY PROBLEMS (pain or burning when urinating, or frequent urination) *BOWEL PROBLEMS (unusual diarrhea, constipation, pain near the anus) TENDERNESS IN MOUTH AND THROAT WITH OR WITHOUT PRESENCE OF ULCERS (sore throat, sores in mouth, or a toothache) UNUSUAL RASH, SWELLING OR PAIN  UNUSUAL VAGINAL DISCHARGE OR ITCHING   Items with * indicate a potential emergency and should be followed up as soon as possible or go to the Emergency Department if any problems should occur.  Please show the CHEMOTHERAPY ALERT CARD or IMMUNOTHERAPY ALERT CARD  at check-in to the Emergency Department and triage nurse.  Should you have questions after your visit or need to cancel or reschedule your appointment, please contact [facility] [phone]  and follow the prompts.  Office hours are 8:00 a.m. to 4:30 p.m. Monday - Friday. Please note that voicemails left after 4:00 p.m. may not be returned until the following business day.  We are closed weekends and major holidays. You have access to a nurse at all times for urgent questions. Please call the main number to the clinic [phone] and follow the prompts.  For any  non-urgent questions, you may also contact your provider using MyChart. We now offer e-Visits for anyone 22 and older to request care online for non-urgent symptoms. For details visit [email or web address].   Also download the MyChart app! Go to the app store, search "MyChart", open the app, select [facility], and log in with your MyChart username and password.  Masks are optional in the cancer centers. If you would like for your care team to wear a mask while they are taking care of you, please let them know. For doctor visits, patients may have with them one support person who is at least 77 years old. At this time, visitors are not allowed in the infusion area.

## 2022-03-12 ENCOUNTER — Inpatient Hospital Stay: Payer: No Typology Code available for payment source

## 2022-03-12 VITALS — BP 130/66 | HR 60 | Temp 96.4°F | Resp 18

## 2022-03-12 DIAGNOSIS — C8510 Unspecified B-cell lymphoma, unspecified site: Secondary | ICD-10-CM

## 2022-03-12 DIAGNOSIS — Z5111 Encounter for antineoplastic chemotherapy: Secondary | ICD-10-CM | POA: Diagnosis not present

## 2022-03-12 MED ORDER — PALONOSETRON HCL INJECTION 0.25 MG/5ML
0.2500 mg | Freq: Once | INTRAVENOUS | Status: AC
  Administered 2022-03-12: 0.25 mg via INTRAVENOUS
  Filled 2022-03-12: qty 5

## 2022-03-12 MED ORDER — SODIUM CHLORIDE 0.9 % IV SOLN
40.0000 mg/m2 | Freq: Once | INTRAVENOUS | Status: AC
  Administered 2022-03-12: 80 mg via INTRAVENOUS
  Filled 2022-03-12: qty 4

## 2022-03-12 MED ORDER — HEPARIN SOD (PORK) LOCK FLUSH 100 UNIT/ML IV SOLN
500.0000 [IU] | Freq: Once | INTRAVENOUS | Status: AC | PRN
  Administered 2022-03-12: 500 [IU]

## 2022-03-12 MED ORDER — SODIUM CHLORIDE 0.9 % IV SOLN: Freq: Once | INTRAVENOUS | Status: AC

## 2022-03-12 MED ORDER — SODIUM CHLORIDE 0.9% FLUSH
10.0000 mL | INTRAVENOUS | Status: DC | PRN
  Administered 2022-03-12: 10 mL

## 2022-03-12 NOTE — Patient Instructions (Signed)
[facility]  Discharge Instructions: Thank you for choosing [facility] to provide your oncology and hematology care.  If you have a lab appointment with the Cancer Center, please come in thru the Main Entrance and check in at the main information desk.  Wear comfortable clothing and clothing appropriate for easy access to any Portacath or PICC line.   We strive to give you quality time with your provider. You may need to reschedule your appointment if you arrive late (15 or more minutes).  Arriving late affects you and other patients whose appointments are after yours.  Also, if you miss three or more appointments without notifying the office, you may be dismissed from the clinic at the provider's discretion.      For prescription refill requests, have your pharmacy contact our office and allow 72 hours for refills to be completed.    Today you received the following chemotherapy and/or immunotherapy agents Etoposide   To help prevent nausea and vomiting after your treatment, we encourage you to take your nausea medication as directed.  BELOW ARE SYMPTOMS THAT SHOULD BE REPORTED IMMEDIATELY: *FEVER GREATER THAN 100.4 F (38 C) OR HIGHER *CHILLS OR SWEATING *NAUSEA AND VOMITING THAT IS NOT CONTROLLED WITH YOUR NAUSEA MEDICATION *UNUSUAL SHORTNESS OF BREATH *UNUSUAL BRUISING OR BLEEDING *URINARY PROBLEMS (pain or burning when urinating, or frequent urination) *BOWEL PROBLEMS (unusual diarrhea, constipation, pain near the anus) TENDERNESS IN MOUTH AND THROAT WITH OR WITHOUT PRESENCE OF ULCERS (sore throat, sores in mouth, or a toothache) UNUSUAL RASH, SWELLING OR PAIN  UNUSUAL VAGINAL DISCHARGE OR ITCHING   Items with * indicate a potential emergency and should be followed up as soon as possible or go to the Emergency Department if any problems should occur.  Please show the CHEMOTHERAPY ALERT CARD or IMMUNOTHERAPY ALERT CARD at check-in to the Emergency  Department and triage nurse.  Should you have questions after your visit or need to cancel or reschedule your appointment, please contact [facility] [phone]  and follow the prompts.  Office hours are 8:00 a.m. to 4:30 p.m. Monday - Friday. Please note that voicemails left after 4:00 p.m. may not be returned until the following business day.  We are closed weekends and major holidays. You have access to a nurse at all times for urgent questions. Please call the main number to the clinic [phone] and follow the prompts.  For any non-urgent questions, you may also contact your provider using MyChart. We now offer e-Visits for anyone 18 and older to request care online for non-urgent symptoms. For details visit [email or web address].   Also download the MyChart app! Go to the app store, search "MyChart", open the app, select [facility], and log in with your MyChart username and password.  Masks are optional in the cancer centers. If you would like for your care team to wear a mask while they are taking care of you, please let them know. For doctor visits, patients may have with them one support person who is at least 77 years old. At this time, visitors are not allowed in the infusion area.  Etoposide Injection What is this medication? ETOPOSIDE (e toe POE side) treats some types of cancer. It works by slowing down the growth of cancer cells. This medicine may be used for other purposes; ask your health care provider or pharmacist if you have questions. COMMON BRAND NAME(S): Etopophos, Toposar, VePesid What should I tell my care team before I   take this medication? They need to know if you have any of these conditions: Infection Kidney disease Liver disease Low blood counts, such as low white cell, platelet, red cell counts An unusual or allergic reaction to etoposide, other medications, foods, dyes, or preservatives If you or your partner are pregnant or trying to  get pregnant Breastfeeding How should I use this medication? This medication is injected into a vein. It is given by your care team in a hospital or clinic setting. Talk to your care team about the use of this medication in children. Special care may be needed. Overdosage: If you think you have taken too much of this medicine contact a poison control center or emergency room at once. NOTE: This medicine is only for you. Do not share this medicine with others. What if I miss a dose? Keep appointments for follow-up doses. It is important not to miss your dose. Call your care team if you are unable to keep an appointment. What may interact with this medication? Warfarin This list may not describe all possible interactions. Give your health care provider a list of all the medicines, herbs, non-prescription drugs, or dietary supplements you use. Also tell them if you smoke, drink alcohol, or use illegal drugs. Some items may interact with your medicine. What should I watch for while using this medication? Your condition will be monitored carefully while you are receiving this medication. This medication may make you feel generally unwell. This is not uncommon as chemotherapy can affect healthy cells as well as cancer cells. Report any side effects. Continue your course of treatment even though you feel ill unless your care team tells you to stop. This medication can cause serious side effects. To reduce the risk, your care team may give you other medications to take before receiving this one. Be sure to follow the directions from your care team. This medication may increase your risk of getting an infection. Call your care team for advice if you get a fever, chills, sore throat, or other symptoms of a cold or flu. Do not treat yourself. Try to avoid being around people who are sick. This medication may increase your risk to bruise or bleed. Call your care team if you notice any unusual bleeding. Talk to  your care team about your risk of cancer. You may be more at risk for certain types of cancers if you take this medication. Talk to your care team if you may be pregnant. Serious birth defects can occur if you take this medication during pregnancy and for 6 months after the last dose. You will need a negative pregnancy test before starting this medication. Contraception is recommended while taking this medication and for 6 months after the last dose. Your care team can help you find the option that works for you. If your partner can get pregnant, use a condom during sex while taking this medication and for 4 months after the last dose. Do not breastfeed while taking this medication. This medication may cause infertility. Talk to your care team if you are concerned about your fertility. What side effects may I notice from receiving this medication? Side effects that you should report to your care team as soon as possible: Allergic reactions--skin rash, itching, hives, swelling of the face, lips, tongue, or throat Infection--fever, chills, cough, sore throat, wounds that don't heal, pain or trouble when passing urine, general feeling of discomfort or being unwell Low red blood cell level--unusual weakness or fatigue, dizziness, headache,   trouble breathing Unusual bruising or bleeding Side effects that usually do not require medical attention (report to your care team if they continue or are bothersome): Diarrhea Fatigue Hair loss Loss of appetite Nausea Vomiting This list may not describe all possible side effects. Call your doctor for medical advice about side effects. You may report side effects to FDA at 1-800-FDA-1088. Where should I keep my medication? This medication is given in a hospital or clinic. It will not be stored at home. NOTE: This sheet is a summary. It may not cover all possible information. If you have questions about this medicine, talk to your doctor, pharmacist, or health  care provider.  2023 Elsevier/Gold Standard (2021-12-18 00:00:00)  

## 2022-03-12 NOTE — Progress Notes (Signed)
Pt presents today for VP-16 per provider's order. Vital signs stable and pt voiced no new complaints at this time.  VP-16 given today per MD orders. Tolerated infusion without adverse affects. Vital signs stable. No complaints at this time. Discharged from clinic ambulatory in stable condition. Alert and oriented x 3. F/U with [facility] as scheduled.

## 2022-03-13 ENCOUNTER — Inpatient Hospital Stay: Payer: No Typology Code available for payment source

## 2022-03-13 VITALS — BP 142/72 | HR 59 | Temp 97.7°F | Resp 18

## 2022-03-13 DIAGNOSIS — C8510 Unspecified B-cell lymphoma, unspecified site: Secondary | ICD-10-CM

## 2022-03-13 DIAGNOSIS — Z5111 Encounter for antineoplastic chemotherapy: Secondary | ICD-10-CM | POA: Diagnosis not present

## 2022-03-13 MED ORDER — PEGFILGRASTIM-CBQV 6 MG/0.6ML ~~LOC~~ SOSY
6.0000 mg | PREFILLED_SYRINGE | Freq: Once | SUBCUTANEOUS | Status: AC
  Administered 2022-03-13: 6 mg via SUBCUTANEOUS
  Filled 2022-03-13: qty 0.6

## 2022-03-13 NOTE — Patient Instructions (Signed)
[facility]  Discharge Instructions: Thank you for choosing [facility] to provide your oncology and hematology care.  If you have a lab appointment with the [facility], please come in thru the Main Entrance and check in at the main information desk.  Wear comfortable clothing and clothing appropriate for easy access to any Portacath or PICC line.   We strive to give you quality time with your provider. You may need to reschedule your appointment if you arrive late (15 or more minutes).  Arriving late affects you and other patients whose appointments are after yours.  Also, if you miss three or more appointments without notifying the office, you may be dismissed from the clinic at the provider's discretion.      For prescription refill requests, have your pharmacy contact our office and allow 72 hours for refills to be completed.    Today you received Udenyca injection.    BELOW ARE SYMPTOMS THAT SHOULD BE REPORTED IMMEDIATELY: *FEVER GREATER THAN 100.4 F (38 C) OR HIGHER *CHILLS OR SWEATING *NAUSEA AND VOMITING THAT IS NOT CONTROLLED WITH YOUR NAUSEA MEDICATION *UNUSUAL SHORTNESS OF BREATH *UNUSUAL BRUISING OR BLEEDING *URINARY PROBLEMS (pain or burning when urinating, or frequent urination) *BOWEL PROBLEMS (unusual diarrhea, constipation, pain near the anus) TENDERNESS IN MOUTH AND THROAT WITH OR WITHOUT PRESENCE OF ULCERS (sore throat, sores in mouth, or a toothache) UNUSUAL RASH, SWELLING OR PAIN  UNUSUAL VAGINAL DISCHARGE OR ITCHING   Items with * indicate a potential emergency and should be followed up as soon as possible or go to the Emergency Department if any problems should occur.  Please show the CHEMOTHERAPY ALERT CARD or IMMUNOTHERAPY ALERT CARD at check-in to the Emergency Department and triage nurse.  Should you have questions after your visit or need to cancel or reschedule your appointment, please contact [facility] [phone]  and follow the prompts.  Office hours are 8:00 a.m. to 4:30 p.m. Monday - Friday. Please note that voicemails left after 4:00 p.m. may not be returned until the following business day.  We are closed weekends and major holidays. You have access to a nurse at all times for urgent questions. Please call the main number to the clinic [phone] and follow the prompts.  For any non-urgent questions, you may also contact your provider using MyChart. We now offer e-Visits for anyone 82 and older to request care online for non-urgent symptoms. For details visit [email or web address].   Also download the MyChart app! Go to the app store, search "MyChart", open the app, select [facility], and log in with your MyChart username and password.  Masks are optional in the cancer centers. If you would like for your care team to wear a mask while they are taking care of you, please let them know. For doctor visits, patients may have with them one support person who is at least 77 years old. At this time, visitors are not allowed in the infusion area.

## 2022-03-13 NOTE — Progress Notes (Signed)
[name] [name] presents today for injection per the provider's orders.  Udenyca administration without incident; injection site WNL; see MAR for injection details.  Patient tolerated procedure well and without incident.  No questions or complaints noted at this time.   Discharged from clinic ambulatory in stable condition. Alert and oriented x 3. F/U with [facility] as scheduled.

## 2022-03-16 ENCOUNTER — Ambulatory Visit (INDEPENDENT_AMBULATORY_CARE_PROVIDER_SITE_OTHER): Payer: Medicare HMO

## 2022-03-16 ENCOUNTER — Ambulatory Visit (INDEPENDENT_AMBULATORY_CARE_PROVIDER_SITE_OTHER): Payer: Medicare HMO | Admitting: *Deleted

## 2022-03-16 DIAGNOSIS — I240 Acute coronary thrombosis not resulting in myocardial infarction: Secondary | ICD-10-CM | POA: Diagnosis not present

## 2022-03-16 DIAGNOSIS — Z5181 Encounter for therapeutic drug level monitoring: Secondary | ICD-10-CM

## 2022-03-16 DIAGNOSIS — I5042 Chronic combined systolic (congestive) and diastolic (congestive) heart failure: Secondary | ICD-10-CM

## 2022-03-16 DIAGNOSIS — Z95810 Presence of automatic (implantable) cardiac defibrillator: Secondary | ICD-10-CM

## 2022-03-16 LAB — POCT INR: INR: 1.9 — AB (ref 2.0–3.0)

## 2022-03-16 NOTE — Patient Instructions (Signed)
Pt started chemo 12/15/2021 Pt takes Prednisone 100 mg on the first 5 days of chemo treatment.  Increase warfarin 1 tablet daily except 1 1/2 tablets on Mondays and Thursdays Starting 5th round of chemo in 3 wks Recheck in 2 wk

## 2022-03-17 ENCOUNTER — Telehealth: Payer: Self-pay

## 2022-03-17 NOTE — Telephone Encounter (Signed)
Remote ICM transmission received.  Attempted call to patient regarding ICM remote transmission and left detailed message per DPR.  Advised to return call for any fluid symptoms or questions. Next ICM remote transmission scheduled 03/23/2022.    

## 2022-03-17 NOTE — Progress Notes (Signed)
EPIC Encounter for ICM Monitoring  Patient Name: [name] [name] is a 76 y.o. male Date: 03/17/2022 Primary Care Physican: Biagio Borg, MD Primary Cardiologist: [name] Electrophysiologist: Allred 09/10/2021 Weight:   206 lbs 10/07/2021 Weight: 206 lbs (does not weight at home) 12/29/2021 Weight: 201 lbs 02/20/2022 Office Weight: 198 lbs                                                           Spoke with patient and heart failure questions reviewed.  Pt reports SOB and extreme weakness from chemo infusions but not sure if the SOB is related to chemo or fluid accumulation.   He has one more chemo infusion tomorrow.  He is losing weight but unable to weigh due to weakness.     Optivol thoracic impedance suggesting possible fluid accumulation starting 7/24.  Decreased impedance appears to correlate with chemo infusions.   Prescribed: Furosemide 20 mg take 1 tablet(s) by mouth daily   Spironolactone 25 mg take 1 tablet by mouth daily Jardiance 10 mg take 1 tablet by mouth daily   Labs: 03/10/2022 Creatinine 1.16, BUN 26, Potassium 3.9, Sodium 135, GFR >60 02/26/2022 Creatinine 1.16, BUN 24, Potassium 4.2, Sodium 135, GFR >60  02/17/2022 Creatinine 1.61, BUN 38, Potassium 4.5, Sodium 135, GFR 44 01/27/2022 Creatinine 1.33, BUN 24, Potassium 4.2, Sodium 134, GFR 55  01/21/2022 Creatinine 1.11, BUN 20, Potassium 4.4, Sodium 137, GFR >60  01/20/2022 Creatinine 1.08, BUN 22, Potassium 4.6, Sodium 137, GFR >60 01/19/2022 Creatinine 1.35, BUN 26, Potassium 4.8, Sodium 136, GFR 54  A complete set of results can be found in Results Review.   Recommendations:  Copy sent to Dr [name] for review and recommendations if needed.     Follow-up plan: ICM clinic phone appointment on 03/23/2022 to recheck fluid levels.   91 day device clinic remote transmission 04/28/2022.     EP/Cardiology Office Visits: 05/20/2022 with Coletta Memos, NP.  Recall 06/14/2022 with Oda Kilts, PA.   Copy of ICM check  sent to Dr. Rayann Heman.   3 month ICM trend: 03/16/2022.    12-14 Month ICM trend:     Rosalene Billings, RN 03/17/2022 8:26 AM

## 2022-03-18 ENCOUNTER — Inpatient Hospital Stay: Payer: No Typology Code available for payment source

## 2022-03-18 VITALS — BP 121/57 | HR 45 | Temp 98.7°F | Resp 18

## 2022-03-18 DIAGNOSIS — C8510 Unspecified B-cell lymphoma, unspecified site: Secondary | ICD-10-CM

## 2022-03-18 DIAGNOSIS — Z5111 Encounter for antineoplastic chemotherapy: Secondary | ICD-10-CM | POA: Diagnosis not present

## 2022-03-18 LAB — COMPREHENSIVE METABOLIC PANEL
ALT: 26 U/L (ref 0–44)
AST: 22 U/L (ref 15–41)
Albumin: 3.8 g/dL (ref 3.5–5.0)
Alkaline Phosphatase: 122 U/L (ref 38–126)
Anion gap: 7 (ref 5–15)
BUN: 26 mg/dL — ABNORMAL HIGH (ref 8–23)
CO2: 29 mmol/L (ref 22–32)
Calcium: 9.4 mg/dL (ref 8.9–10.3)
Chloride: 100 mmol/L (ref 98–111)
Creatinine, Ser: 1.04 mg/dL (ref 0.61–1.24)
GFR, Estimated: >60 mL/min (ref >60)
Glucose, Bld: 157 mg/dL — ABNORMAL HIGH (ref 70–99)
Potassium: 4.7 mmol/L (ref 3.5–5.1)
Sodium: 136 mmol/L (ref 135–145)
Total Bilirubin: 0.9 mg/dL (ref 0.3–1.2)
Total Protein: 6.9 g/dL (ref 6.5–8.1)

## 2022-03-18 LAB — MAGNESIUM: Magnesium: 2.2 mg/dL (ref 1.7–2.4)

## 2022-03-18 LAB — CBC WITH DIFFERENTIAL/PLATELET
Abs Immature Granulocytes: 0 10*3/uL (ref 0.00–0.07)
Band Neutrophils: 10 %
Basophils Absolute: 0.2 10*3/uL — ABNORMAL HIGH (ref 0.0–0.1)
Basophils Relative: 2 %
Eosinophils Absolute: 0.6 10*3/uL — ABNORMAL HIGH (ref 0.0–0.5)
Eosinophils Relative: 5 %
HCT: 37.8 % — ABNORMAL LOW (ref 39.0–52.0)
Hemoglobin: 12.2 g/dL — ABNORMAL LOW (ref 13.0–17.0)
Lymphocytes Relative: 16 %
Lymphs Abs: 1.8 10*3/uL (ref 0.7–4.0)
MCH: 32.2 pg (ref 26.0–34.0)
MCHC: 32.3 g/dL (ref 30.0–36.0)
MCV: 99.7 fL (ref 80.0–100.0)
Monocytes Absolute: 0.7 10*3/uL (ref 0.1–1.0)
Monocytes Relative: 6 %
Neutro Abs: 8.1 10*3/uL — ABNORMAL HIGH (ref 1.7–7.7)
Neutrophils Relative %: 61 %
Platelets: 152 10*3/uL (ref 150–400)
RBC: 3.79 MIL/uL — ABNORMAL LOW (ref 4.22–5.81)
RDW: 16.2 % — ABNORMAL HIGH (ref 11.5–15.5)
WBC: 11.4 10*3/uL — ABNORMAL HIGH (ref 4.0–10.5)
nRBC: 0 % (ref 0.0–0.2)

## 2022-03-18 MED ORDER — SODIUM CHLORIDE 0.9% FLUSH
10.0000 mL | Freq: Once | INTRAVENOUS | Status: AC | PRN
  Administered 2022-03-18: 10 mL

## 2022-03-18 MED ORDER — MAGNESIUM SULFATE 2 GM/50ML IV SOLN
2.0000 g | Freq: Once | INTRAVENOUS | Status: AC
  Administered 2022-03-18: 2 g via INTRAVENOUS

## 2022-03-18 MED ORDER — MAGNESIUM SULFATE 2 GM/50ML IV SOLN
INTRAVENOUS | Status: AC
  Administered 2022-03-18: 2 g
  Filled 2022-03-18: qty 50

## 2022-03-18 MED ORDER — POTASSIUM CHLORIDE IN NACL 20-0.9 MEQ/L-% IV SOLN
Freq: Once | INTRAVENOUS | Status: AC
  Filled 2022-03-18: qty 1000

## 2022-03-18 MED ORDER — HEPARIN SOD (PORK) LOCK FLUSH 100 UNIT/ML IV SOLN
500.0000 [IU] | Freq: Once | INTRAVENOUS | Status: AC | PRN
  Administered 2022-03-18: 500 [IU]

## 2022-03-19 ENCOUNTER — Encounter: Payer: Self-pay | Admitting: Hematology

## 2022-03-19 NOTE — Patient Instructions (Signed)
  Discharge Instructions: Thank you for choosing [facility] to provide your oncology and hematology care.  If you have a lab appointment with the [facility], please come in thru the Main Entrance and check in at the main information desk.  Wear comfortable clothing and clothing appropriate for easy access to any Portacath or PICC line.   We strive to give you quality time with your provider. You may need to reschedule your appointment if you arrive late (15 or more minutes).  Arriving late affects you and other patients whose appointments are after yours.  Also, if you miss three or more appointments without notifying the office, you may be dismissed from the clinic at the provider's discretion.      For prescription refill requests, have your pharmacy contact our office and allow 72 hours for refills to be completed.    Today you received fluids       To help prevent nausea and vomiting after your treatment, we encourage you to take your nausea medication as directed.  BELOW ARE SYMPTOMS THAT SHOULD BE REPORTED IMMEDIATELY: *FEVER GREATER THAN 100.4 F (38 C) OR HIGHER *CHILLS OR SWEATING *NAUSEA AND VOMITING THAT IS NOT CONTROLLED WITH YOUR NAUSEA MEDICATION *UNUSUAL SHORTNESS OF BREATH *UNUSUAL BRUISING OR BLEEDING *URINARY PROBLEMS (pain or burning when urinating, or frequent urination) *BOWEL PROBLEMS (unusual diarrhea, constipation, pain near the anus) TENDERNESS IN MOUTH AND THROAT WITH OR WITHOUT PRESENCE OF ULCERS (sore throat, sores in mouth, or a toothache) UNUSUAL RASH, SWELLING OR PAIN  UNUSUAL VAGINAL DISCHARGE OR ITCHING   Items with * indicate a potential emergency and should be followed up as soon as possible or go to the Emergency Department if any problems should occur.  Please show the CHEMOTHERAPY ALERT CARD or IMMUNOTHERAPY ALERT CARD at check-in to the Emergency Department and triage nurse.  Should you have  questions after your visit or need to cancel or reschedule your appointment, please contact [facility] [phone]  and follow the prompts.  Office hours are 8:00 a.m. to 4:30 p.m. Monday - Friday. Please note that voicemails left after 4:00 p.m. may not be returned until the following business day.  We are closed weekends and major holidays. You have access to a nurse at all times for urgent questions. Please call the main number to the clinic [phone] and follow the prompts.  For any non-urgent questions, you may also contact your provider using MyChart. We now offer e-Visits for anyone 30 and older to request care online for non-urgent symptoms. For details visit [email or web address].   Also download the MyChart app! Go to the app store, search "MyChart", open the app, select [facility], and log in with your MyChart username and password.  Masks are optional in the cancer centers. If you would like for your care team to wear a mask while they are taking care of you, please let them know. For doctor visits, patients may have with them one support person who is at least 77 years old. At this time, visitors are not allowed in the infusion area.

## 2022-03-23 ENCOUNTER — Ambulatory Visit (INDEPENDENT_AMBULATORY_CARE_PROVIDER_SITE_OTHER): Payer: Medicare HMO

## 2022-03-23 DIAGNOSIS — I5042 Chronic combined systolic (congestive) and diastolic (congestive) heart failure: Secondary | ICD-10-CM

## 2022-03-23 DIAGNOSIS — Z95810 Presence of automatic (implantable) cardiac defibrillator: Secondary | ICD-10-CM

## 2022-03-24 NOTE — Progress Notes (Signed)
EPIC Encounter for ICM Monitoring  Patient Name: [name] [name] is a 76 y.o. male Date: 03/24/2022 Primary Care Physican: Biagio Borg, MD Primary Cardiologist: [name] Electrophysiologist: Allred 09/10/2021 Weight:   206 lbs 10/07/2021 Weight: 206 lbs (does not weight at home) 12/29/2021 Weight: 201 lbs 02/20/2022 Office Weight: 198 lbs                                                           Spoke with patient and heart failure questions reviewed.  Pt continues to report SOB and extreme weakness and assumes it is related to chemo.  He has one more chemo infusion to take.    Optivol thoracic impedance suggesting fluid levels returned to normal.  Decreased impedance appears to correlate with chemo infusions.   Prescribed: Furosemide 20 mg take 1 tablet(s) by mouth daily   Spironolactone 25 mg take 1 tablet by mouth daily Jardiance 10 mg take 1 tablet by mouth daily   Labs: 03/10/2022 Creatinine 1.16, BUN 26, Potassium 3.9, Sodium 135, GFR >60 02/26/2022 Creatinine 1.16, BUN 24, Potassium 4.2, Sodium 135, GFR >60  02/17/2022 Creatinine 1.61, BUN 38, Potassium 4.5, Sodium 135, GFR 44 01/27/2022 Creatinine 1.33, BUN 24, Potassium 4.2, Sodium 134, GFR 55  01/21/2022 Creatinine 1.11, BUN 20, Potassium 4.4, Sodium 137, GFR >60  01/20/2022 Creatinine 1.08, BUN 22, Potassium 4.6, Sodium 137, GFR >60 01/19/2022 Creatinine 1.35, BUN 26, Potassium 4.8, Sodium 136, GFR 54  A complete set of results can be found in Results Review.   Recommendations:  No changes and encouraged to call if experiencing any fluid symptoms.     Follow-up plan: ICM clinic phone appointment on 04/20/2022   91 day device clinic remote transmission 04/28/2022.     EP/Cardiology Office Visits: 05/20/2022 with Coletta Memos, NP.  Recall 06/14/2022 with Oda Kilts, PA.   Copy of ICM check sent to Dr. Rayann Heman.   3 month ICM trend: 03/23/2022.    12-14 Month ICM trend:     Rosalene Billings, RN 03/24/2022 2:07  PM

## 2022-03-30 ENCOUNTER — Telehealth: Payer: Self-pay | Admitting: Cardiology

## 2022-03-30 ENCOUNTER — Ambulatory Visit (INDEPENDENT_AMBULATORY_CARE_PROVIDER_SITE_OTHER): Payer: Medicare HMO | Admitting: *Deleted

## 2022-03-30 DIAGNOSIS — I240 Acute coronary thrombosis not resulting in myocardial infarction: Secondary | ICD-10-CM | POA: Diagnosis not present

## 2022-03-30 DIAGNOSIS — Z5181 Encounter for therapeutic drug level monitoring: Secondary | ICD-10-CM

## 2022-03-30 LAB — POCT INR: INR: 5.2 — AB (ref 2.0–3.0)

## 2022-03-30 NOTE — Patient Instructions (Signed)
Pt started chemo 12/15/2021 Pt takes Prednisone 100 mg on the first 5 days of chemo treatment.  Hold warfarin tonight and tomorrow night then decrease dose to 1 tablet daily except 1 1/2 tablets on Mondays  Recheck in 1 wk

## 2022-03-30 NOTE — Telephone Encounter (Signed)
Gregary Signs from the [facility] in [place] called stating that the pt was referred by our office to [facility] and wants the [facility] to cover it but they need the records and the reason why he was referred. She states she will fax over a request form and provided her fax number Fax [phone]

## 2022-03-31 ENCOUNTER — Inpatient Hospital Stay (HOSPITAL_BASED_OUTPATIENT_CLINIC_OR_DEPARTMENT_OTHER): Payer: No Typology Code available for payment source | Admitting: Hematology

## 2022-03-31 ENCOUNTER — Inpatient Hospital Stay: Payer: No Typology Code available for payment source

## 2022-03-31 VITALS — BP 109/65 | HR 60 | Temp 96.6°F | Resp 18

## 2022-03-31 DIAGNOSIS — Z5111 Encounter for antineoplastic chemotherapy: Secondary | ICD-10-CM | POA: Diagnosis not present

## 2022-03-31 DIAGNOSIS — C8510 Unspecified B-cell lymphoma, unspecified site: Secondary | ICD-10-CM

## 2022-03-31 LAB — COMPREHENSIVE METABOLIC PANEL
ALT: 27 U/L (ref 0–44)
AST: 25 U/L (ref 15–41)
Albumin: 3.1 g/dL — ABNORMAL LOW (ref 3.5–5.0)
Alkaline Phosphatase: 67 U/L (ref 38–126)
Anion gap: 9 (ref 5–15)
BUN: 18 mg/dL (ref 8–23)
CO2: 27 mmol/L (ref 22–32)
Calcium: 8.7 mg/dL — ABNORMAL LOW (ref 8.9–10.3)
Chloride: 101 mmol/L (ref 98–111)
Creatinine, Ser: 1.08 mg/dL (ref 0.61–1.24)
GFR, Estimated: >60 mL/min (ref >60)
Glucose, Bld: 188 mg/dL — ABNORMAL HIGH (ref 70–99)
Potassium: 3.5 mmol/L (ref 3.5–5.1)
Sodium: 137 mmol/L (ref 135–145)
Total Bilirubin: 1 mg/dL (ref 0.3–1.2)
Total Protein: 6.2 g/dL — ABNORMAL LOW (ref 6.5–8.1)

## 2022-03-31 LAB — CBC WITH DIFFERENTIAL/PLATELET
Abs Immature Granulocytes: 0.07 10*3/uL (ref 0.00–0.07)
Basophils Absolute: 0.1 10*3/uL (ref 0.0–0.1)
Basophils Relative: 1 %
Eosinophils Absolute: 0.2 10*3/uL (ref 0.0–0.5)
Eosinophils Relative: 3 %
HCT: 35.4 % — ABNORMAL LOW (ref 39.0–52.0)
Hemoglobin: 11.1 g/dL — ABNORMAL LOW (ref 13.0–17.0)
Immature Granulocytes: 1 %
Lymphocytes Relative: 12 %
Lymphs Abs: 1 10*3/uL (ref 0.7–4.0)
MCH: 30.7 pg (ref 26.0–34.0)
MCHC: 31.4 g/dL (ref 30.0–36.0)
MCV: 97.8 fL (ref 80.0–100.0)
Monocytes Absolute: 0.7 10*3/uL (ref 0.1–1.0)
Monocytes Relative: 8 %
Neutro Abs: 6.2 10*3/uL (ref 1.7–7.7)
Neutrophils Relative %: 75 %
Platelets: 196 10*3/uL (ref 150–400)
RBC: 3.62 MIL/uL — ABNORMAL LOW (ref 4.22–5.81)
RDW: 15.5 % (ref 11.5–15.5)
WBC: 8.3 10*3/uL (ref 4.0–10.5)
nRBC: 0 % (ref 0.0–0.2)

## 2022-03-31 LAB — MAGNESIUM: Magnesium: 2 mg/dL (ref 1.7–2.4)

## 2022-03-31 MED ORDER — SODIUM CHLORIDE 0.9 % IV SOLN
562.5000 mg/m2 | Freq: Once | INTRAVENOUS | Status: AC
  Administered 2022-03-31: 1220 mg via INTRAVENOUS
  Filled 2022-03-31: qty 61

## 2022-03-31 MED ORDER — PALONOSETRON HCL INJECTION 0.25 MG/5ML: 0.2500 mg | Freq: Once | INTRAVENOUS | Status: DC

## 2022-03-31 MED ORDER — HEPARIN SOD (PORK) LOCK FLUSH 100 UNIT/ML IV SOLN
500.0000 [IU] | Freq: Once | INTRAVENOUS | Status: AC | PRN
  Administered 2022-03-31: 500 [IU]

## 2022-03-31 MED ORDER — SODIUM CHLORIDE 0.9 % IV SOLN: Freq: Once | INTRAVENOUS | Status: AC

## 2022-03-31 MED ORDER — SODIUM CHLORIDE 0.9 % IV SOLN
10.0000 mg | Freq: Once | INTRAVENOUS | Status: AC
  Administered 2022-03-31: 10 mg via INTRAVENOUS
  Filled 2022-03-31: qty 10

## 2022-03-31 MED ORDER — DIPHENHYDRAMINE HCL 25 MG PO CAPS
50.0000 mg | ORAL_CAPSULE | Freq: Once | ORAL | Status: AC
  Administered 2022-03-31: 50 mg via ORAL
  Filled 2022-03-31: qty 2

## 2022-03-31 MED ORDER — PALONOSETRON HCL INJECTION 0.25 MG/5ML
0.2500 mg | Freq: Once | INTRAVENOUS | Status: AC
  Administered 2022-03-31: 0.25 mg via INTRAVENOUS
  Filled 2022-03-31: qty 5

## 2022-03-31 MED ORDER — FAMOTIDINE IN NACL 20-0.9 MG/50ML-% IV SOLN
20.0000 mg | Freq: Once | INTRAVENOUS | Status: AC
  Administered 2022-03-31: 20 mg via INTRAVENOUS
  Filled 2022-03-31: qty 50

## 2022-03-31 MED ORDER — ACETAMINOPHEN 325 MG PO TABS
650.0000 mg | ORAL_TABLET | Freq: Once | ORAL | Status: AC
  Administered 2022-03-31: 650 mg via ORAL
  Filled 2022-03-31: qty 2

## 2022-03-31 MED ORDER — PREDNISONE 20 MG PO TABS
100.0000 mg | ORAL_TABLET | Freq: Every day | ORAL | 0 refills | Status: DC
Start: 2022-03-31 — End: 2022-07-15

## 2022-03-31 MED ORDER — SODIUM CHLORIDE 0.9 % IV SOLN
375.0000 mg/m2 | Freq: Once | INTRAVENOUS | Status: AC
  Administered 2022-03-31: 800 mg via INTRAVENOUS
  Filled 2022-03-31: qty 30

## 2022-03-31 MED ORDER — SODIUM CHLORIDE 0.9% FLUSH
10.0000 mL | INTRAVENOUS | Status: DC | PRN
  Administered 2022-03-31: 10 mL

## 2022-03-31 MED ORDER — VINCRISTINE SULFATE CHEMO INJECTION 1 MG/ML
1.0000 mg | Freq: Once | INTRAVENOUS | Status: AC
  Administered 2022-03-31: 1 mg via INTRAVENOUS
  Filled 2022-03-31: qty 1

## 2022-03-31 MED ORDER — SODIUM CHLORIDE 0.9 % IV SOLN
38.0000 mg/m2 | Freq: Once | INTRAVENOUS | Status: AC
  Administered 2022-03-31: 80 mg via INTRAVENOUS
  Filled 2022-03-31: qty 4

## 2022-03-31 NOTE — Patient Instructions (Addendum)
[facility] Discharge Instructions   You were seen and examined today by Dr. Katragadda.  He reviewed the results of your lab work which are normal/stable.   We will proceed with your final treatment today.   We will repeat a PET scan prior to your next visit.   Return as scheduled.    Thank you for choosing [facility] at [facility] to provide your oncology and hematology care.  To afford each patient quality time with our provider, please arrive at least 15 minutes before your scheduled appointment time.   If you have a lab appointment with the Cancer Center please come in thru the Main Entrance and check in at the main information desk.  You need to re-schedule your appointment should you arrive 10 or more minutes late.  We strive to give you quality time with our providers, and arriving late affects you and other patients whose appointments are after yours.  Also, if you no show three or more times for appointments you may be dismissed from the clinic at the providers discretion.     Again, thank you for choosing [facility].  Our hope is that these requests will decrease the amount of time that you wait before being seen by our physicians.       _____________________________________________________________  Should you have questions after your visit to [facility], please contact our office at [phone] and follow the prompts.  Our office hours are 8:00 a.m. and 4:30 p.m. Monday - Friday.  Please note that voicemails left after 4:00 p.m. may not be returned until the following business day.  We are closed weekends and major holidays.  You do have access to a nurse 24-7, just call the main number to the clinic [phone] and do not press any options, hold on the line and a nurse will answer the phone.    For prescription refill requests, have your pharmacy contact our office and allow 72 hours.     Due to Covid, you will need to wear a mask upon entering the hospital. If you do not have a mask, a mask will be given to you at the Main Entrance upon arrival. For doctor visits, patients may have 1 support person age 18 or older with them. For treatment visits, patients can not have anyone with them due to social distancing guidelines and our immunocompromised population.      

## 2022-03-31 NOTE — Progress Notes (Signed)
Patient has been examined by Dr. Katragadda, and vital signs and labs have been reviewed. ANC, Creatinine, LFTs, hemoglobin, and platelets are within treatment parameters per M.D. - pt may proceed with treatment.  Primary RN and pharmacy notified.  

## 2022-03-31 NOTE — Patient Instructions (Signed)
[facility]  Discharge Instructions: Thank you for choosing [facility] to provide your oncology and hematology care.  If you have a lab appointment with the [facility], please come in thru the Main Entrance and check in at the main information desk.  Wear comfortable clothing and clothing appropriate for easy access to any Portacath or PICC line.   We strive to give you quality time with your provider. You may need to reschedule your appointment if you arrive late (15 or more minutes).  Arriving late affects you and other patients whose appointments are after yours.  Also, if you miss three or more appointments without notifying the office, you may be dismissed from the clinic at the provider's discretion.      For prescription refill requests, have your pharmacy contact our office and allow 72 hours for refills to be completed.    Today you received the following chemotherapy and/or immunotherapy agents rituxan, vincristine, cytoxan, etoposide      To help prevent nausea and vomiting after your treatment, we encourage you to take your nausea medication as directed.  BELOW ARE SYMPTOMS THAT SHOULD BE REPORTED IMMEDIATELY: *FEVER GREATER THAN 100.4 F (38 C) OR HIGHER *CHILLS OR SWEATING *NAUSEA AND VOMITING THAT IS NOT CONTROLLED WITH YOUR NAUSEA MEDICATION *UNUSUAL SHORTNESS OF BREATH *UNUSUAL BRUISING OR BLEEDING *URINARY PROBLEMS (pain or burning when urinating, or frequent urination) *BOWEL PROBLEMS (unusual diarrhea, constipation, pain near the anus) TENDERNESS IN MOUTH AND THROAT WITH OR WITHOUT PRESENCE OF ULCERS (sore throat, sores in mouth, or a toothache) UNUSUAL RASH, SWELLING OR PAIN  UNUSUAL VAGINAL DISCHARGE OR ITCHING   Items with * indicate a potential emergency and should be followed up as soon as possible or go to the Emergency Department if any problems should occur.  Please show the CHEMOTHERAPY ALERT CARD or IMMUNOTHERAPY ALERT  CARD at check-in to the Emergency Department and triage nurse.  Should you have questions after your visit or need to cancel or reschedule your appointment, please contact [facility] [phone]  and follow the prompts.  Office hours are 8:00 a.m. to 4:30 p.m. Monday - Friday. Please note that voicemails left after 4:00 p.m. may not be returned until the following business day.  We are closed weekends and major holidays. You have access to a nurse at all times for urgent questions. Please call the main number to the clinic [phone] and follow the prompts.  For any non-urgent questions, you may also contact your provider using MyChart. We now offer e-Visits for anyone 28 and older to request care online for non-urgent symptoms. For details visit [email or web address].   Also download the MyChart app! Go to the app store, search "MyChart", open the app, select [facility], and log in with your MyChart username and password.  Masks are optional in the cancer centers. If you would like for your care team to wear a mask while they are taking care of you, please let them know. You may have one support person who is at least 77 years old accompany you for your appointments.

## 2022-03-31 NOTE — Progress Notes (Signed)
Labs reviewed with MD today at office visit. Ok to treat per MD.   Treatment given per orders. Patient tolerated it well without problems. Vitals stable and discharged home from clinic ambulatory. Follow up as scheduled.

## 2022-03-31 NOTE — Progress Notes (Signed)
[name] [name], [place] [address]   CLINIC:  Medical Oncology/Hematology  PCP:  Biagio Borg, MD [place] / [place] [address] [phone]   REASON FOR VISIT:  Follow-up for stage IVb high-grade B-cell lymphoma  PRIOR THERAPY: none  NGS Results: not done  CURRENT THERAPY: R-CEOP q21d x 3 Cycles  BRIEF ONCOLOGIC HISTORY:  Oncology History  High grade B-cell lymphoma ([place])  12/03/2021 Initial Diagnosis   High grade B-cell lymphoma ([place])   12/15/2021 -  Chemotherapy   Patient is on Treatment Plan : NON-HODGKIN'S LYMPHOMA R-CEOP q21d x 3 Cycles       CANCER STAGING:  Cancer Staging  High grade B-cell lymphoma ([place]) Staging form: Hodgkin and Non-Hodgkin Lymphoma, AJCC 8th Edition - Clinical stage from 12/03/2021: Stage IV (Diffuse large B-cell lymphoma) - Unsigned   INTERVAL HISTORY:  [name], a 76 y.o. male, here for follow-up and toxicity assessment prior to cycle 6 of R-CEOP.  Cycle 5 was on 03/10/2022.  He reported weakness and decreased eating after last cycle.  He is drinking boost +1 to 2 cans/day.  Weight has been stable.  Denies any falls.  Denies any nausea vomiting or diarrhea.  He had constipation.  REVIEW OF SYSTEMS:  Review of Systems  Constitutional:  Positive for fatigue. Negative for appetite change.  Respiratory:  Positive for shortness of breath.   Cardiovascular:  Negative for chest pain.  Gastrointestinal:  Negative for diarrhea.  Neurological:  Negative for extremity weakness and numbness.  Psychiatric/Behavioral:  Negative for sleep disturbance.   All other systems reviewed and are negative.   PAST MEDICAL/SURGICAL HISTORY:  Past Medical History:  Diagnosis Date   ALLERGIC RHINITIS 10/31/2007   Qualifier: Diagnosis of  By: Jenny Reichmann MD, [place]    Allergy    CHOLECYSTECTOMY, HX OF 06/04/2007   Qualifier: Diagnosis of  By: Danny Lawless CMA, [place]     COLONIC POLYPS, HX OF 10/31/2007    Qualifier: Diagnosis of  By: Jenny Reichmann MD, [place] GRAFT, HX OF 06/04/2007   Qualifier: Diagnosis of  By: [place], [place]     CORONARY ARTERY DISEASE 06/04/2007   Qualifier: Diagnosis of  By: Danny Lawless CMA, [place]     Diverticulosis    Erectile dysfunction    Eye twitch    right eye since chilhood    HYPERLIPIDEMIA 06/04/2007   Qualifier: Diagnosis of  By: [place], [place]     Hypersomnolence    HYPERTENSION 06/04/2007   Qualifier: Diagnosis of  By: [place], [place]     Hypothyroidism    ISCHEMIC CARDIOMYOPATHY 06/04/2007   Qualifier: Diagnosis of  By: [place], [place]     Myocardial infarction ([place])    per patient , his occurred in 1998    Osteoarthritis    PLMD (periodic limb movement disorder)    Sleep apnea    no cpap' per patient , "i was checked for it and they said i didnt have it "    Vertigo    Vitamin D deficiency    Past Surgical History:  Procedure Laterality Date   BIOPSY  11/25/2021   Procedure: BIOPSY;  Surgeon: Jackquline Denmark, MD;  Location: [facility];  Service: Gastroenterology;;   CHOLECYSTECTOMY     COLONOSCOPY     COLONOSCOPY WITH PROPOFOL N/A 11/25/2021   Procedure: COLONOSCOPY WITH PROPOFOL;  Surgeon: Jackquline Denmark, MD;  Location: [facility];  Service: Gastroenterology;  Laterality: N/A;   CORONARY ARTERY BYPASS GRAFT     ESOPHAGOGASTRODUODENOSCOPY (EGD) WITH PROPOFOL N/A 11/25/2021   Procedure: ESOPHAGOGASTRODUODENOSCOPY (EGD) WITH PROPOFOL;  Surgeon: Jackquline Denmark, MD;  Location: [facility];  Service: Gastroenterology;  Laterality: N/A;   ICD IMPLANT N/A 01/30/2020   Procedure: ICD IMPLANT;  Surgeon: Thompson Grayer, MD;  Location: [facility];  Service: Cardiovascular;  Laterality: N/A;   IR IMAGING GUIDED PORT INSERTION  12/04/2021   KNEE ARTHROPLASTY Left 08/05/2017   Procedure: LEFT TOTAL KNEE ARTHROPLASTY WITH COMPUTER NAVIGATION;  Surgeon: Rod Can, MD;  Location: [facility];  Service: Orthopedics;   Laterality: Left;  Needs RNFA   PENILE PROSTHESIS IMPLANT     POLYPECTOMY  11/25/2021   Procedure: POLYPECTOMY;  Surgeon: Jackquline Denmark, MD;  Location: [facility];  Service: Gastroenterology;;   REPLACEMENT TOTAL KNEE  06/12/2013   spinal cyst     THORACOTOMY     left anterior; wound exploration and debridement   TONSILLECTOMY AND ADENOIDECTOMY     age 32    SOCIAL HISTORY:  Social History   Socioeconomic History   Marital status: Married    Spouse name: [name] [name]   Number of children: 3   Years of education: Not on file   Highest education level: Not on file  Occupational History   Occupation: truck Education administrator: [name]  Tobacco Use   Smoking status: Former    Packs/day: 4.00    Years: 17.00    Total pack years: 68.00    Types: Cigarettes    Quit date: 05/07/1981    Years since quitting: 40.9    Passive exposure: Never   Smokeless tobacco: Never   Tobacco comments:    Pt states that he would let most of them "burn" pt states that he used anywhere between 4-5PPD  Vaping Use   Vaping Use: Never used  Substance and Sexual Activity   Alcohol use: Yes    Alcohol/week: 0.0 standard drinks of alcohol    Comment: rare   Drug use: No   Sexual activity: Not on file  Other Topics Concern   Not on file  Social History Narrative   Lives in [place] with spouse   Retired Administrator   Social Determinants of Health   Financial Resource Strain: [place]  (10/27/2021)   Overall Financial Resource Strain (CARDIA)    Difficulty of Paying Living Expenses: Not hard at all  Food Insecurity: No Food Insecurity (10/27/2021)   Hunger Vital Sign    Worried About Running Out of Food in the Last Year: Never true    Ran Out of Food in the Last Year: Never true  Transportation Needs: No Transportation Needs (10/27/2021)   PRAPARE - Hydrologist (Medical): No    Lack of Transportation (Non-Medical): No  Physical Activity: Inactive (10/27/2021)    Exercise Vital Sign    Days of Exercise per Week: 0 days    Minutes of Exercise per Session: 0 min  Stress: No Stress Concern Present (10/27/2021)   [place]    Feeling of Stress : Not at all  Social Connections: Moderately Isolated (10/27/2021)   Social Connection and Isolation Panel [NHANES]    Frequency of Communication with Friends and Family: More than three times a week    Frequency of Social Gatherings with Friends and Family: More than three times a week    Attends Religious Services:  Never    Active Member of Clubs or Organizations: No    Attends Archivist Meetings: Never    Marital Status: Married  Human resources officer Violence: Not At Risk (10/27/2021)   Humiliation, Afraid, Rape, and Kick questionnaire    Fear of Current or Ex-Partner: No    Emotionally Abused: No    Physically Abused: No    Sexually Abused: No    FAMILY HISTORY:  Family History  Problem Relation Age of Onset   Stomach cancer Mother        smokes   Lung cancer Father        chewed tobacco   Heart disease Brother        first MI at 2yo, now 69 for transplant list/ ICM   Pancreatic cancer Brother    Colon cancer Maternal Grandfather    COPD Son        was a smoker   Pancreatic cancer Maternal Uncle    Pancreatic cancer Cousin        mat side x 4   Esophageal cancer Neg Hx    Prostate cancer Neg Hx    Rectal cancer Neg Hx     CURRENT MEDICATIONS:  Current Outpatient Medications  Medication Sig Dispense Refill   allopurinol (ZYLOPRIM) 300 MG tablet Take 1 tablet (300 mg total) by mouth daily. 30 tablet 3   ARTIFICIAL TEAR SOLUTION OP Place 1 drop into both eyes daily as needed (dry eyes).     Ascorbic Acid (VITA-C PO) Take 1,000 mg by mouth daily.     b complex vitamins capsule Take 1 capsule by mouth daily.     empagliflozin (JARDIANCE) 10 MG TABS tablet Take 10 mg by mouth daily.     ezetimibe (ZETIA) 10 MG tablet  TAKE 1 TABLET EVERY DAY (Patient taking differently: Take by mouth every other day.) 90 tablet 3   feeding supplement (ENSURE ENLIVE / ENSURE PLUS) LIQD Take 237 mLs by mouth 3 (three) times daily between meals. 237 mL 12   furosemide (LASIX) 20 MG tablet Take 1 tablet (20 mg total) by mouth daily. 90 tablet 3   lidocaine-prilocaine (EMLA) cream Apply a small amount to port a cath site (do not rub in) and cover with plastic wrap 1 hour prior to infusion appointments 30 g 3   metoprolol succinate (TOPROL-XL) 25 MG 24 hr tablet Take 1 tablet (25 mg total) by mouth daily. Take with or immediately following a meal. 30 tablet 6   Multiple Vitamins-Minerals (MULTIVITAMIN WITH MINERALS) tablet Take 1 tablet by mouth daily. Centrum silver     prochlorperazine (COMPAZINE) 10 MG tablet Take 1 tablet (10 mg total) by mouth every 6 (six) hours as needed for nausea or vomiting. 30 tablet 3   rosuvastatin (CRESTOR) 5 MG tablet TAKE 1 TABLET EVERY OTHER DAY 45 tablet 10   spironolactone (ALDACTONE) 25 MG tablet TAKE 1/2 TABLET EVERY DAY 45 tablet 3   triamcinolone (NASACORT) 55 MCG/ACT AERO nasal inhaler Place 2 sprays into the nose daily. (Patient taking differently: Place 2 sprays into the nose daily as needed (allergies).) 3 Inhaler 3   VITAMIN D PO Take 1,000 Units by mouth daily.     warfarin (COUMADIN) 5 MG tablet TAKE 1 TABLET EVERY DAY AT 4 PM (Patient taking differently: Take 2.5-5 mg by mouth See admin instructions. Taking 5 mg every day except on Friday taking 1/2 tablet = 2.5 mg) 90 tablet 4   No current facility-administered medications for  this visit.    ALLERGIES:  Allergies  Allergen Reactions   Ace Inhibitors Cough   Statins Other (See Comments)    Muscle pains    PHYSICAL EXAM:  Performance status (ECOG): 0 - Asymptomatic  There were no vitals filed for this visit. Wt Readings from Last 3 Encounters:  03/31/22 182 lb 8 oz (82.8 kg)  03/10/22 184 lb 8 oz (83.7 kg)  03/04/22 185 lb  12.8 oz (84.3 kg)   Physical Exam Vitals reviewed.  Constitutional:      Appearance: Normal appearance.  Cardiovascular:     Rate and Rhythm: Normal rate and regular rhythm.     Pulses: Normal pulses.     Heart sounds: Normal heart sounds.  Pulmonary:     Effort: Pulmonary effort is normal.     Breath sounds: Normal breath sounds.  Neurological:     General: No focal deficit present.     Mental Status: He is alert and oriented to person, place, and time.  Psychiatric:        Mood and Affect: Mood normal.        Behavior: Behavior normal.     LABORATORY DATA:  I have reviewed the labs as listed.     Latest Ref Rng & Units 03/18/2022    1:18 PM 03/10/2022    8:15 AM 02/26/2022    1:23 PM  CBC  WBC 4.0 - 10.5 K/uL 11.4  8.4  5.8   Hemoglobin 13.0 - 17.0 g/dL 12.2  11.6  11.0   Hematocrit 39.0 - 52.0 % 37.8  36.6  34.3   Platelets 150 - 400 K/uL 152  176  150       Latest Ref Rng & Units 03/18/2022    1:18 PM 03/10/2022    8:15 AM 02/26/2022    1:23 PM  CMP  Glucose 70 - 99 mg/dL 157  181  125   BUN 8 - 23 mg/dL _0 Creatinine 0.61 - 1.24 mg/dL 1.04  1.16  1.16   Sodium 135 - 145 mmol/L 136  135  135   Potassium 3.5 - 5.1 mmol/L 4.7  3.9  4.2   Chloride 98 - 111 mmol/L 100  101  99   CO2 22 - 32 mmol/L _1 Calcium 8.9 - 10.3 mg/dL 9.4  9.1  8.9   Total Protein 6.5 - 8.1 g/dL 6.9  6.6  6.9   Total Bilirubin 0.3 - 1.2 mg/dL 0.9  0.9  1.4   Alkaline Phos 38 - 126 U/L 122  68  97   AST 15 - 41 U/L _2 ALT 0 - 44 U/L _3 DIAGNOSTIC IMAGING:  I have independently reviewed the scans and discussed with the patient. CT ANGIO AO+BIFEM W & OR WO CONTRAST  Result Date: 03/03/2022 CLINICAL DATA:  Popliteal artery aneurysm, right calf numbness, recent diagnosis lymphoma EXAM: CT ANGIOGRAPHY OF ABDOMINAL AORTA WITH ILIOFEMORAL RUNOFF TECHNIQUE: Multidetector CT imaging of the abdomen, pelvis and lower extremities was performed using the standard  protocol during bolus administration of intravenous contrast. Multiplanar CT image reconstructions and MIPs were obtained to evaluate the vascular anatomy. RADIATION DOSE REDUCTION: This exam was performed according to the departmental dose-optimization program which includes automated exposure control, adjustment of the mA and/or kV according to patient size and/or use of iterative reconstruction technique. CONTRAST:  17m  OMNIPAQUE IOHEXOL 350 MG/ML SOLN COMPARISON:  PET-CT 02/05/2022 and previous FINDINGS: VASCULAR Aorta: Mild scattered calcified atheromatous plaque. No aneurysm, dissection, or stenosis. Celiac: Patent without evidence of aneurysm, dissection, vasculitis or significant stenosis. SMA: Patent without evidence of aneurysm, dissection, vasculitis or significant stenosis. Renals: 3 left renal arteries, inferior dominant, wall widely patent. Single right, with calcified ostial plaque resulting in short segment stenosis of at least mild severity, patent distally. IMA: Patent without evidence of aneurysm, dissection, vasculitis or significant stenosis. RIGHT Lower Extremity Inflow: Common iliac mildly ectatic up to 1.5 cm diameter, scattered nonocclusive calcified plaque Internal iliac widely patent External iliac mildly tortuous, patent Outflow: Common femoral scattered calcified plaque without stenosis Deep femoral branches patent SFA scattered calcified plaque without stenosis. Popliteal artery visualization partially degraded by streak artifact from knee arthroplasty hardware. There is a fusiform aneurysm measuring at least 4.7 cm diameter involving the popliteal artery below the knee, containing a moderate amount of nonocclusive mural thrombus. Runoff: Anterior tibial artery is atheromatous, diminutive but patent across the ankle as dorsalis pedis Peroneal artery patent distally Posterior tibial artery mildly atheromatous but patent, diminutive below the ankle LEFT Lower Extremity Inflow: Common  iliac minimally atheromatous, patent Internal iliac minimally atheromatous, patent External iliac mildly tortuous, patent Outflow: Common femoral mildly atheromatous, patent Deep femoral branches patent SFA mildly atheromatous, widely patent Popliteal artery imaging partially degraded by streak artifact from knee arthroplasty hardware. No definite stenosis or aneurysm Runoff: Posterior tibial and peroneal artery are mildly disease, patent Anterior tibial is very diminutive at the level of the distal calf with non opacification of the dorsalis pedis Veins: No obvious venous abnormality within the limitations of this arterial phase study. Review of the MIP images confirms the above findings. NON-VASCULAR Lower chest: Transvenous pacing leads partially visualized. Heart size normal. Coronary calcifications. No pleural or pericardial effusion. Hepatobiliary: No focal liver abnormality is seen. Status post cholecystectomy. No biliary dilatation. Pancreas: Unremarkable Spleen: Normal size, no focal lesion Adrenals/Urinary Tract: Adrenal glands are unremarkable. Kidneys are normal, without renal calculi, focal lesion, or hydronephrosis. Bladder is unremarkable. Stomach/Bowel: Stomach is incompletely distended. Small bowel decompressed. Normal appendix. The colon is incompletely distended. Scattered distal descending and sigmoid diverticula without significant adjacent inflammatory change. Lymphatic: No abdominal or pelvic adenopathy. Reproductive: Penile prosthesis components.  Prostate unremarkable. Other: Bilateral pelvic phleboliths.  No ascites.  No free air. Musculoskeletal: Subacute L3 superior endplate compression fracture deformity with less than 20% loss of height anteriorly, probably present on the prior study of 02/05/2022 but new since 10/31/2021. spondylitic changes in the lower lumbar spine. Bilateral knee arthroplasty hardware projects in expected location. IMPRESSION: 1. 4.7 cm right distal popliteal artery  aneurysm 2. Three-vessel right tibial runoff, peroneal and posterior tibial runoff on the left. 3. Aortoiliac atherosclerosis (ICD10-170.0) without significant occlusive inflow disease 4. Subacute L3 compression fracture deformity. 5. Sigmoid diverticulosis Electronically Signed   By: Lucrezia Europe M.D.   On: 03/03/2022 14:34     ASSESSMENT:  Stage IVb high-grade B-cell lymphoma: - He had unintentional weight loss of 20 pounds since September 2022.  Weight has been stable for the last 2 months.  He had multiple family members with pancreatic cancer. - CT AP with contrast (10/31/2021): Abnormal nodular thickening of the right anterior pararenal fascia, suspicious for atypical malignancy.  No evidence of pancreatic malignancy.  Irregular wall thickening of the proximal stomach.  No ascites or peritoneal nodularity. - Biopsy of nodular thickening (11/17/2021): High-grade B-cell lymphoma with Burkitt-like features.  Ki-67 100%. 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  Neoplastic lymphocytes positive for CD20, CD10, Bcl-2, BCL6 and mum 1.  Ki-67 more than 95%.  H pylori IHC negative. - High-grade lymphoma panel (11/17/2021): Negative for BCL6, MYC, BCL2 rearrangements, negative for MYC amplification, t(8:14) not detected. - High risk features for CNS disease: Age more than 20, stage III/IV, extranodal involvement more than 1 site.  Based on 3 points, intermediate risk.  No clinical signs of CNS involvement. - IPI: High intermediate with 3 points (age more than 60, stage III/IV, extranodal involvement more than 1 site) - R-CEOP cycle 1 started on 12/15/2021 - Hospitalized for 2 days after cycle 2 from 01/19/2022 through 01/21/2022 with pneumonia.    Social/family history: - He has AICD placed in June 2021 secondary to CHF. - He is a retired English as a second language teacher.  He worked at [employer] in [place].  He had exposure to some chemicals.  He  quit smoking more than 40 years ago.  He drinks mostly beer and occasionally whiskey at bedtime.    Ischemic cardiomyopathy/CHF/apical thrombus: - He is on Coumadin.  Will check PT/INR today. - Last seen by Dr. [name] on 11/06/2021.   -Last echo on 12/27/2020 with EF 30-35%.  Limited visualization of endocardium.  LV has moderately decreased function.   PLAN:  Stage IVb high-grade B-cell lymphoma: - PET scan after 3 cycles on 01/16/2022 showed [place] 1 response.       -Reviewed labs today which shows normal LFTs and CBC.  He will proceed with cycle 6 today.  He will take prednisone for the next 4 days.  RTC 6 weeks with repeat PET scan and labs.  2.  TLS prophylaxis: - Uric acid is normal at 7.1.  We have discontinued allopurinol.  3.  Acute kidney injury on CKD: - Creatinine is 1.08 and better today.   Orders placed this encounter:  No orders of the defined types were placed in this encounter.    Derek Jack, MD [place] [phone]

## 2022-04-01 ENCOUNTER — Inpatient Hospital Stay: Payer: No Typology Code available for payment source

## 2022-04-01 VITALS — BP 120/64 | HR 86 | Temp 96.7°F | Resp 18

## 2022-04-01 DIAGNOSIS — C8510 Unspecified B-cell lymphoma, unspecified site: Secondary | ICD-10-CM

## 2022-04-01 DIAGNOSIS — Z5111 Encounter for antineoplastic chemotherapy: Secondary | ICD-10-CM | POA: Diagnosis not present

## 2022-04-01 MED ORDER — SODIUM CHLORIDE 0.9 % IV SOLN
38.0000 mg/m2 | Freq: Once | INTRAVENOUS | Status: AC
  Administered 2022-04-01: 80 mg via INTRAVENOUS
  Filled 2022-04-01: qty 4

## 2022-04-01 MED ORDER — SODIUM CHLORIDE 0.9 % IV SOLN: Freq: Once | INTRAVENOUS | Status: AC

## 2022-04-01 MED ORDER — SODIUM CHLORIDE 0.9% FLUSH
10.0000 mL | INTRAVENOUS | Status: DC | PRN
  Administered 2022-04-01: 10 mL

## 2022-04-01 MED ORDER — HEPARIN SOD (PORK) LOCK FLUSH 100 UNIT/ML IV SOLN
500.0000 [IU] | Freq: Once | INTRAVENOUS | Status: AC | PRN
  Administered 2022-04-01: 500 [IU]

## 2022-04-01 MED ORDER — PROCHLORPERAZINE MALEATE 10 MG PO TABS
10.0000 mg | ORAL_TABLET | Freq: Once | ORAL | Status: AC
  Administered 2022-04-01: 10 mg via ORAL
  Filled 2022-04-01: qty 1

## 2022-04-01 NOTE — Patient Instructions (Signed)
[facility]  Discharge Instructions: Thank you for choosing [facility] to provide your oncology and hematology care.  If you have a lab appointment with the Cancer Center, please come in thru the Main Entrance and check in at the main information desk.  Wear comfortable clothing and clothing appropriate for easy access to any Portacath or PICC line.   We strive to give you quality time with your provider. You may need to reschedule your appointment if you arrive late (15 or more minutes).  Arriving late affects you and other patients whose appointments are after yours.  Also, if you miss three or more appointments without notifying the office, you may be dismissed from the clinic at the provider's discretion.      For prescription refill requests, have your pharmacy contact our office and allow 72 hours for refills to be completed.    Today you received the following chemotherapy and/or immunotherapy agents Etoposide.  Etoposide Injection What is this medication? ETOPOSIDE (e toe POE side) treats some types of cancer. It works by slowing down the growth of cancer cells. This medicine may be used for other purposes; ask your health care provider or pharmacist if you have questions. COMMON BRAND NAME(S): Etopophos, Toposar, VePesid What should I tell my care team before I take this medication? They need to know if you have any of these conditions: Infection Kidney disease Liver disease Low blood counts, such as low white cell, platelet, red cell counts An unusual or allergic reaction to etoposide, other medications, foods, dyes, or preservatives If you or your partner are pregnant or trying to get pregnant Breastfeeding How should I use this medication? This medication is injected into a vein. It is given by your care team in a hospital or clinic setting. Talk to your care team about the use of this medication in children. Special care may be  needed. Overdosage: If you think you have taken too much of this medicine contact a poison control center or emergency room at once. NOTE: This medicine is only for you. Do not share this medicine with others. What if I miss a dose? Keep appointments for follow-up doses. It is important not to miss your dose. Call your care team if you are unable to keep an appointment. What may interact with this medication? Warfarin This list may not describe all possible interactions. Give your health care provider a list of all the medicines, herbs, non-prescription drugs, or dietary supplements you use. Also tell them if you smoke, drink alcohol, or use illegal drugs. Some items may interact with your medicine. What should I watch for while using this medication? Your condition will be monitored carefully while you are receiving this medication. This medication may make you feel generally unwell. This is not uncommon as chemotherapy can affect healthy cells as well as cancer cells. Report any side effects. Continue your course of treatment even though you feel ill unless your care team tells you to stop. This medication can cause serious side effects. To reduce the risk, your care team may give you other medications to take before receiving this one. Be sure to follow the directions from your care team. This medication may increase your risk of getting an infection. Call your care team for advice if you get a fever, chills, sore throat, or other symptoms of a cold or flu. Do not treat yourself. Try to avoid being around people who are sick. This medication may increase your   risk to bruise or bleed. Call your care team if you notice any unusual bleeding. Talk to your care team about your risk of cancer. You may be more at risk for certain types of cancers if you take this medication. Talk to your care team if you may be pregnant. Serious birth defects can occur if you take this medication during pregnancy and for  6 months after the last dose. You will need a negative pregnancy test before starting this medication. Contraception is recommended while taking this medication and for 6 months after the last dose. Your care team can help you find the option that works for you. If your partner can get pregnant, use a condom during sex while taking this medication and for 4 months after the last dose. Do not breastfeed while taking this medication. This medication may cause infertility. Talk to your care team if you are concerned about your fertility. What side effects may I notice from receiving this medication? Side effects that you should report to your care team as soon as possible: Allergic reactions--skin rash, itching, hives, swelling of the face, lips, tongue, or throat Infection--fever, chills, cough, sore throat, wounds that don't heal, pain or trouble when passing urine, general feeling of discomfort or being unwell Low red blood cell level--unusual weakness or fatigue, dizziness, headache, trouble breathing Unusual bruising or bleeding Side effects that usually do not require medical attention (report to your care team if they continue or are bothersome): Diarrhea Fatigue Hair loss Loss of appetite Nausea Vomiting This list may not describe all possible side effects. Call your doctor for medical advice about side effects. You may report side effects to FDA at 1-800-FDA-1088. Where should I keep my medication? This medication is given in a hospital or clinic. It will not be stored at home. NOTE: This sheet is a summary. It may not cover all possible information. If you have questions about this medicine, talk to your doctor, pharmacist, or health care provider.  2023 Elsevier/Gold Standard (2021-12-18 00:00:00)        To help prevent nausea and vomiting after your treatment, we encourage you to take your nausea medication as directed.  BELOW ARE SYMPTOMS THAT SHOULD BE REPORTED  IMMEDIATELY: *FEVER GREATER THAN 100.4 F (38 C) OR HIGHER *CHILLS OR SWEATING *NAUSEA AND VOMITING THAT IS NOT CONTROLLED WITH YOUR NAUSEA MEDICATION *UNUSUAL SHORTNESS OF BREATH *UNUSUAL BRUISING OR BLEEDING *URINARY PROBLEMS (pain or burning when urinating, or frequent urination) *BOWEL PROBLEMS (unusual diarrhea, constipation, pain near the anus) TENDERNESS IN MOUTH AND THROAT WITH OR WITHOUT PRESENCE OF ULCERS (sore throat, sores in mouth, or a toothache) UNUSUAL RASH, SWELLING OR PAIN  UNUSUAL VAGINAL DISCHARGE OR ITCHING   Items with * indicate a potential emergency and should be followed up as soon as possible or go to the Emergency Department if any problems should occur.  Please show the CHEMOTHERAPY ALERT CARD or IMMUNOTHERAPY ALERT CARD at check-in to the Emergency Department and triage nurse.  Should you have questions after your visit or need to cancel or reschedule your appointment, please contact [facility] [phone]  and follow the prompts.  Office hours are 8:00 a.m. to 4:30 p.m. Monday - Friday. Please note that voicemails left after 4:00 p.m. may not be returned until the following business day.  We are closed weekends and major holidays. You have access to a nurse at all times for urgent questions. Please call the main number to the clinic [phone] and follow   the prompts.  For any non-urgent questions, you may also contact your provider using MyChart. We now offer e-Visits for anyone 18 and older to request care online for non-urgent symptoms. For details visit [email or web address].   Also download the MyChart app! Go to the app store, search "MyChart", open the app, select [facility], and log in with your MyChart username and password.  Masks are optional in the cancer centers. If you would like for your care team to wear a mask while they are taking care of you, please let them know. You may have one support person who is at least 77 years  old accompany you for your appointments.  

## 2022-04-01 NOTE — Progress Notes (Signed)
Patient presents today for chemotherapy infusion.  Patient is in satisfactory condition with no complaints voiced.  Vital signs are stable.  We will proceed with treatment per MD orders.   Patient tolerated treatment well with no complaints voiced.  Patient left ambulatory in stable condition.  Vital signs stable at discharge.  Follow up as scheduled.    

## 2022-04-02 ENCOUNTER — Inpatient Hospital Stay: Payer: No Typology Code available for payment source

## 2022-04-02 VITALS — BP 120/75 | HR 61 | Temp 97.0°F | Resp 18

## 2022-04-02 DIAGNOSIS — C8510 Unspecified B-cell lymphoma, unspecified site: Secondary | ICD-10-CM

## 2022-04-02 DIAGNOSIS — Z5111 Encounter for antineoplastic chemotherapy: Secondary | ICD-10-CM | POA: Diagnosis not present

## 2022-04-02 MED ORDER — SODIUM CHLORIDE 0.9% FLUSH
10.0000 mL | INTRAVENOUS | Status: DC | PRN
  Administered 2022-04-02: 10 mL

## 2022-04-02 MED ORDER — SODIUM CHLORIDE 0.9 % IV SOLN: Freq: Once | INTRAVENOUS | Status: AC

## 2022-04-02 MED ORDER — SODIUM CHLORIDE 0.9 % IV SOLN
38.0000 mg/m2 | Freq: Once | INTRAVENOUS | Status: AC
  Administered 2022-04-02: 80 mg via INTRAVENOUS
  Filled 2022-04-02: qty 4

## 2022-04-02 MED ORDER — HEPARIN SOD (PORK) LOCK FLUSH 100 UNIT/ML IV SOLN
500.0000 [IU] | Freq: Once | INTRAVENOUS | Status: AC | PRN
  Administered 2022-04-02: 500 [IU]

## 2022-04-02 MED ORDER — PALONOSETRON HCL INJECTION 0.25 MG/5ML
0.2500 mg | Freq: Once | INTRAVENOUS | Status: AC
  Administered 2022-04-02: 0.25 mg via INTRAVENOUS
  Filled 2022-04-02: qty 5

## 2022-04-02 NOTE — Patient Instructions (Signed)
[facility]  Discharge Instructions: Thank you for choosing [facility] to provide your oncology and hematology care.  If you have a lab appointment with the Cancer Center, please come in thru the Main Entrance and check in at the main information desk.  Wear comfortable clothing and clothing appropriate for easy access to any Portacath or PICC line.   We strive to give you quality time with your provider. You may need to reschedule your appointment if you arrive late (15 or more minutes).  Arriving late affects you and other patients whose appointments are after yours.  Also, if you miss three or more appointments without notifying the office, you may be dismissed from the clinic at the provider's discretion.      For prescription refill requests, have your pharmacy contact our office and allow 72 hours for refills to be completed.    Today you received the following chemotherapy and/or immunotherapy agents Etoposide      To help prevent nausea and vomiting after your treatment, we encourage you to take your nausea medication as directed.  BELOW ARE SYMPTOMS THAT SHOULD BE REPORTED IMMEDIATELY: *FEVER GREATER THAN 100.4 F (38 C) OR HIGHER *CHILLS OR SWEATING *NAUSEA AND VOMITING THAT IS NOT CONTROLLED WITH YOUR NAUSEA MEDICATION *UNUSUAL SHORTNESS OF BREATH *UNUSUAL BRUISING OR BLEEDING *URINARY PROBLEMS (pain or burning when urinating, or frequent urination) *BOWEL PROBLEMS (unusual diarrhea, constipation, pain near the anus) TENDERNESS IN MOUTH AND THROAT WITH OR WITHOUT PRESENCE OF ULCERS (sore throat, sores in mouth, or a toothache) UNUSUAL RASH, SWELLING OR PAIN  UNUSUAL VAGINAL DISCHARGE OR ITCHING   Items with * indicate a potential emergency and should be followed up as soon as possible or go to the Emergency Department if any problems should occur.  Please show the CHEMOTHERAPY ALERT CARD or IMMUNOTHERAPY ALERT CARD at check-in to the  Emergency Department and triage nurse.  Should you have questions after your visit or need to cancel or reschedule your appointment, please contact [facility] [phone]  and follow the prompts.  Office hours are 8:00 a.m. to 4:30 p.m. Monday - Friday. Please note that voicemails left after 4:00 p.m. may not be returned until the following business day.  We are closed weekends and major holidays. You have access to a nurse at all times for urgent questions. Please call the main number to the clinic [phone] and follow the prompts.  For any non-urgent questions, you may also contact your provider using MyChart. We now offer e-Visits for anyone 18 and older to request care online for non-urgent symptoms. For details visit [email or web address].   Also download the MyChart app! Go to the app store, search "MyChart", open the app, select [place], and log in with your MyChart username and password.  Masks are optional in the cancer centers. If you would like for your care team to wear a mask while they are taking care of you, please let them know. You may have one support person who is at least 77 years old accompany you for your appointments.  

## 2022-04-02 NOTE — Progress Notes (Signed)
Pt presents for D3 Etoposide per provider's order. Vital signs stable and pt voiced no new complaints at this time.  Etoposide given today per MD orders. Tolerated infusion without adverse affects. Vital signs stable. No complaints at this time. Discharged from clinic ambulatory in stable condition. Alert and oriented x 3. F/U with [facility] as scheduled.

## 2022-04-03 ENCOUNTER — Inpatient Hospital Stay: Payer: No Typology Code available for payment source

## 2022-04-03 VITALS — BP 146/72 | HR 77 | Temp 97.8°F | Resp 18

## 2022-04-03 DIAGNOSIS — C8510 Unspecified B-cell lymphoma, unspecified site: Secondary | ICD-10-CM

## 2022-04-03 DIAGNOSIS — Z5111 Encounter for antineoplastic chemotherapy: Secondary | ICD-10-CM | POA: Diagnosis not present

## 2022-04-03 MED ORDER — PEGFILGRASTIM-CBQV 6 MG/0.6ML ~~LOC~~ SOSY
6.0000 mg | PREFILLED_SYRINGE | Freq: Once | SUBCUTANEOUS | Status: AC
  Administered 2022-04-03: 6 mg via SUBCUTANEOUS

## 2022-04-03 NOTE — Progress Notes (Signed)
Udenyca injection given per orders.Patient tolerated it well without problems. Vitals stable and discharged home from clinic ambulatory. Follow up as scheduled.  

## 2022-04-03 NOTE — Patient Instructions (Signed)
[facility]  Discharge Instructions: Thank you for choosing [facility] to provide your oncology and hematology care.  If you have a lab appointment with the [facility], please come in thru the Main Entrance and check in at the main information desk.  Wear comfortable clothing and clothing appropriate for easy access to any Portacath or PICC line.   We strive to give you quality time with your provider. You may need to reschedule your appointment if you arrive late (15 or more minutes).  Arriving late affects you and other patients whose appointments are after yours.  Also, if you miss three or more appointments without notifying the office, you may be dismissed from the clinic at the provider's discretion.      For prescription refill requests, have your pharmacy contact our office and allow 72 hours for refills to be completed.    Today you received Udenyca injection     To help prevent nausea and vomiting after your treatment, we encourage you to take your nausea medication as directed.  BELOW ARE SYMPTOMS THAT SHOULD BE REPORTED IMMEDIATELY: *FEVER GREATER THAN 100.4 F (38 C) OR HIGHER *CHILLS OR SWEATING *NAUSEA AND VOMITING THAT IS NOT CONTROLLED WITH YOUR NAUSEA MEDICATION *UNUSUAL SHORTNESS OF BREATH *UNUSUAL BRUISING OR BLEEDING *URINARY PROBLEMS (pain or burning when urinating, or frequent urination) *BOWEL PROBLEMS (unusual diarrhea, constipation, pain near the anus) TENDERNESS IN MOUTH AND THROAT WITH OR WITHOUT PRESENCE OF ULCERS (sore throat, sores in mouth, or a toothache) UNUSUAL RASH, SWELLING OR PAIN  UNUSUAL VAGINAL DISCHARGE OR ITCHING   Items with * indicate a potential emergency and should be followed up as soon as possible or go to the Emergency Department if any problems should occur.  Please show the CHEMOTHERAPY ALERT CARD or IMMUNOTHERAPY ALERT CARD at check-in to the Emergency Department and triage nurse.  Should you  have questions after your visit or need to cancel or reschedule your appointment, please contact [facility] [phone]  and follow the prompts.  Office hours are 8:00 a.m. to 4:30 p.m. Monday - Friday. Please note that voicemails left after 4:00 p.m. may not be returned until the following business day.  We are closed weekends and major holidays. You have access to a nurse at all times for urgent questions. Please call the main number to the clinic [phone] and follow the prompts.  For any non-urgent questions, you may also contact your provider using MyChart. We now offer e-Visits for anyone 30 and older to request care online for non-urgent symptoms. For details visit [email or web address].   Also download the MyChart app! Go to the app store, search "MyChart", open the app, select [place], and log in with your MyChart username and password.  Masks are optional in the cancer centers. If you would like for your care team to wear a mask while they are taking care of you, please let them know. You may have one support person who is at least 77 years old accompany you for your appointments.

## 2022-04-09 ENCOUNTER — Ambulatory Visit: Payer: No Typology Code available for payment source | Attending: Cardiology | Admitting: *Deleted

## 2022-04-09 DIAGNOSIS — I240 Acute coronary thrombosis not resulting in myocardial infarction: Secondary | ICD-10-CM

## 2022-04-09 DIAGNOSIS — Z5181 Encounter for therapeutic drug level monitoring: Secondary | ICD-10-CM | POA: Diagnosis not present

## 2022-04-09 LAB — POCT INR: INR: 2.3 (ref 2.0–3.0)

## 2022-04-09 NOTE — Patient Instructions (Signed)
Pt started chemo 12/15/2021 Pt takes Prednisone 100 mg on the first 5 days of chemo treatment.  Continue warfarin 1 tablet daily except 1 1/2 tablets on Mondays  Recheck in 2 wk

## 2022-04-20 ENCOUNTER — Ambulatory Visit (INDEPENDENT_AMBULATORY_CARE_PROVIDER_SITE_OTHER): Payer: Medicare HMO

## 2022-04-20 DIAGNOSIS — Z95810 Presence of automatic (implantable) cardiac defibrillator: Secondary | ICD-10-CM | POA: Diagnosis not present

## 2022-04-20 DIAGNOSIS — I5042 Chronic combined systolic (congestive) and diastolic (congestive) heart failure: Secondary | ICD-10-CM

## 2022-04-20 NOTE — Progress Notes (Signed)
EPIC Encounter for ICM Monitoring  Patient Name: [name] [name] is a 76 y.o. male Date: 04/20/2022 Primary Care Physican: Biagio Borg, MD Primary Cardiologist: [name] Electrophysiologist: Mealor 09/10/2021 Weight:   206 lbs 10/07/2021 Weight: 206 lbs (does not weight at home) 12/29/2021 Weight: 201 lbs 02/20/2022 Office Weight: 198 lbs 04/20/2022 Not weighing at home                                                           Spoke with patient and heart failure questions reviewed.  Pt asymptomatic for fluid accumulation.  He had a meal at all you can eat buffet restaurant over the weekend which may have contributed to fluid accumulation on today's report.    Chemo treatments ended and on 9/21 will have PET scan to check if chemo has been effective.     Optivol thoracic impedance suggesting possible fluid accumulation starting 9/11 and also from 8/14-8/28.  Decreased impedance appears to correlate with chemo infusions.   Prescribed: Furosemide 20 mg take 1 tablet(s) by mouth daily   Spironolactone 25 mg take 1 tablet by mouth daily Jardiance 10 mg take 1 tablet by mouth daily   Labs: 03/10/2022 Creatinine 1.16, BUN 26, Potassium 3.9, Sodium 135, GFR >60 02/26/2022 Creatinine 1.16, BUN 24, Potassium 4.2, Sodium 135, GFR >60  02/17/2022 Creatinine 1.61, BUN 38, Potassium 4.5, Sodium 135, GFR 44 01/27/2022 Creatinine 1.33, BUN 24, Potassium 4.2, Sodium 134, GFR 55  01/21/2022 Creatinine 1.11, BUN 20, Potassium 4.4, Sodium 137, GFR >60  01/20/2022 Creatinine 1.08, BUN 22, Potassium 4.6, Sodium 137, GFR >60 01/19/2022 Creatinine 1.35, BUN 26, Potassium 4.8, Sodium 136, GFR 54  A complete set of results can be found in Results Review.   Recommendations:  Recommendation to limit salt intake to 2000 mg daily.  Encouraged to call if experiencing any fluid symptoms.    Follow-up plan: ICM clinic phone appointment on 04/27/2022 to recheck fluid levels.   91 day device clinic remote transmission  04/28/2022.     EP/Cardiology Office Visits: 05/20/2022 with Coletta Memos, NP.  Recall 06/14/2022 with Oda Kilts, PA.   Copy of ICM check sent to Dr. Myles Gip.   3 month ICM trend: 04/20/2022.    12-14 Month ICM trend:     Rosalene Billings, RN 04/20/2022 10:36 AM

## 2022-04-21 ENCOUNTER — Ambulatory Visit (INDEPENDENT_AMBULATORY_CARE_PROVIDER_SITE_OTHER): Payer: Medicare HMO | Admitting: Vascular Surgery

## 2022-04-21 ENCOUNTER — Encounter: Payer: Self-pay | Admitting: Vascular Surgery

## 2022-04-21 DIAGNOSIS — I724 Aneurysm of artery of lower extremity: Secondary | ICD-10-CM | POA: Insufficient documentation

## 2022-04-21 NOTE — Progress Notes (Signed)
Patient name: [name] [name] MRN: [id number] DOB: Jan 07, 1946 Sex: male  REASON FOR CONSULT: Follow-up to discuss repair of right popliteal artery aneurysm  HPI: [name] [name] is a 76 y.o. male, with history of lymphoma undergoing chemotherapy, hypertension, hyperlipidemia, coronary artery disease status post CABG that presents for follow-up to discuss right popliteal artery aneurysm.  He was initially seen by Dr. Donnetta Hutching in [place].  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  He was undergoing chemotherapy for lymphoma and Dr. Donnetta Hutching recommended he follow-up with me in 1 to 2 months.  Patient states he has now completed his chemotherapy approximately 3 weeks ago.  He is due for a PET scan on 9/21.  Complains of swelling in his right leg.  Past Medical History:  Diagnosis Date   ALLERGIC RHINITIS 10/31/2007   Qualifier: Diagnosis of  By: Jenny Reichmann MD, [place]    Allergy    CHOLECYSTECTOMY, HX OF 06/04/2007   Qualifier: Diagnosis of  By: Danny Lawless CMA, [place]     COLONIC POLYPS, HX OF 10/31/2007   Qualifier: Diagnosis of  By: Jenny Reichmann MD, [place] GRAFT, HX OF 06/04/2007   Qualifier: Diagnosis of  By: [place], [place]     CORONARY ARTERY DISEASE 06/04/2007   Qualifier: Diagnosis of  By: Danny Lawless CMA, [place]     Diverticulosis    Erectile dysfunction    Eye twitch    right eye since chilhood    HYPERLIPIDEMIA 06/04/2007   Qualifier: Diagnosis of  By: [place], [place]     Hypersomnolence    HYPERTENSION 06/04/2007   Qualifier: Diagnosis of  By: [place], [place]     Hypothyroidism    ISCHEMIC CARDIOMYOPATHY 06/04/2007   Qualifier: Diagnosis of  By: Danny Lawless CMA, [place]     Myocardial infarction ([place])    per patient , his occurred in 1998    Osteoarthritis    PLMD (periodic limb movement disorder)    Sleep apnea    no cpap' per patient , "i was checked for it and they said i  didnt have it "    Vertigo    Vitamin D deficiency     Past Surgical History:  Procedure Laterality Date   BIOPSY  11/25/2021   Procedure: BIOPSY;  Surgeon: Jackquline Denmark, MD;  Location: [facility];  Service: Gastroenterology;;   CHOLECYSTECTOMY     COLONOSCOPY     COLONOSCOPY WITH PROPOFOL N/A 11/25/2021   Procedure: COLONOSCOPY WITH PROPOFOL;  Surgeon: Jackquline Denmark, MD;  Location: [facility];  Service: Gastroenterology;  Laterality: N/A;   CORONARY ARTERY BYPASS GRAFT     ESOPHAGOGASTRODUODENOSCOPY (EGD) WITH PROPOFOL N/A 11/25/2021   Procedure: ESOPHAGOGASTRODUODENOSCOPY (EGD) WITH PROPOFOL;  Surgeon: Jackquline Denmark, MD;  Location: [facility];  Service: Gastroenterology;  Laterality: N/A;   ICD IMPLANT N/A 01/30/2020   Procedure: ICD IMPLANT;  Surgeon: Thompson Grayer, MD;  Location: [facility];  Service: Cardiovascular;  Laterality: N/A;   IR IMAGING GUIDED PORT INSERTION  12/04/2021   KNEE ARTHROPLASTY Left 08/05/2017   Procedure: LEFT TOTAL KNEE ARTHROPLASTY WITH COMPUTER NAVIGATION;  Surgeon: Rod Can, MD;  Location: [facility];  Service: Orthopedics;  Laterality: Left;  Needs RNFA   PENILE PROSTHESIS IMPLANT     POLYPECTOMY  11/25/2021   Procedure: POLYPECTOMY;  Surgeon: Jackquline Denmark, MD;  Location: [facility];  Service: Gastroenterology;;   REPLACEMENT TOTAL  KNEE  06/12/2013   spinal cyst     THORACOTOMY     left anterior; wound exploration and debridement   TONSILLECTOMY AND ADENOIDECTOMY     age 60    Family History  Problem Relation Age of Onset   Stomach cancer Mother        smokes   Lung cancer Father        chewed tobacco   Heart disease Brother        first MI at 31yo, now 40 for transplant list/ ICM   Pancreatic cancer Brother    Colon cancer Maternal Grandfather    COPD Son        was a smoker   Pancreatic cancer Maternal Uncle    Pancreatic cancer Cousin        mat side x 4   Esophageal cancer Neg Hx    Prostate cancer Neg Hx    Rectal  cancer Neg Hx     SOCIAL HISTORY: Social History   Socioeconomic History   Marital status: Married    Spouse name: [name]   Number of children: 3   Years of education: Not on file   Highest education level: Not on file  Occupational History   Occupation: truck Education administrator: [name]  Tobacco Use   Smoking status: Former    Packs/day: 4.00    Years: 17.00    Total pack years: 68.00    Types: Cigarettes    Quit date: 05/07/1981    Years since quitting: 40.9    Passive exposure: Never   Smokeless tobacco: Never   Tobacco comments:    Pt states that he would let most of them "burn" pt states that he used anywhere between 4-5PPD  Vaping Use   Vaping Use: Never used  Substance and Sexual Activity   Alcohol use: Yes    Alcohol/week: 0.0 standard drinks of alcohol    Comment: rare   Drug use: No   Sexual activity: Not on file  Other Topics Concern   Not on file  Social History Narrative   Lives in [place] with spouse   Retired Administrator   Social Determinants of Health   Financial Resource Strain: [place]  (10/27/2021)   Overall Financial Resource Strain (CARDIA)    Difficulty of Paying Living Expenses: Not hard at all  Food Insecurity: No Food Insecurity (10/27/2021)   Hunger Vital Sign    Worried About Running Out of Food in the Last Year: Never true    Ran Out of Food in the Last Year: Never true  Transportation Needs: No Transportation Needs (10/27/2021)   PRAPARE - Hydrologist (Medical): No    Lack of Transportation (Non-Medical): No  Physical Activity: Inactive (10/27/2021)   Exercise Vital Sign    Days of Exercise per Week: 0 days    Minutes of Exercise per Session: 0 min  Stress: No Stress Concern Present (10/27/2021)   [place]    Feeling of Stress : Not at all  Social Connections: Moderately Isolated (10/27/2021)   Social Connection and  Isolation Panel [NHANES]    Frequency of Communication with Friends and Family: More than three times a week    Frequency of Social Gatherings with Friends and Family: More than three times a week    Attends Religious Services: Never    Marine scientist or Organizations: No  Attends Archivist Meetings: Never    Marital Status: Married  Human resources officer Violence: Not At Risk (10/27/2021)   Humiliation, Afraid, Rape, and Kick questionnaire    Fear of Current or Ex-Partner: No    Emotionally Abused: No    Physically Abused: No    Sexually Abused: No    Allergies  Allergen Reactions   Ace Inhibitors Cough   Statins Other (See Comments)    Muscle pains    Current Outpatient Medications  Medication Sig Dispense Refill   ARTIFICIAL TEAR SOLUTION OP Place 1 drop into both eyes daily as needed (dry eyes).     Ascorbic Acid (VITA-C PO) Take 1,000 mg by mouth daily.     b complex vitamins capsule Take 1 capsule by mouth daily.     empagliflozin (JARDIANCE) 10 MG TABS tablet Take 10 mg by mouth daily.     ezetimibe (ZETIA) 10 MG tablet TAKE 1 TABLET EVERY DAY (Patient taking differently: Take by mouth every other day.) 90 tablet 3   feeding supplement (ENSURE ENLIVE / ENSURE PLUS) LIQD Take 237 mLs by mouth 3 (three) times daily between meals. 237 mL 12   furosemide (LASIX) 20 MG tablet Take 1 tablet (20 mg total) by mouth daily. 90 tablet 3   lidocaine-prilocaine (EMLA) cream Apply a small amount to port a cath site (do not rub in) and cover with plastic wrap 1 hour prior to infusion appointments 30 g 3   metoprolol succinate (TOPROL-XL) 25 MG 24 hr tablet Take 1 tablet (25 mg total) by mouth daily. Take with or immediately following a meal. 30 tablet 6   Multiple Vitamins-Minerals (MULTIVITAMIN WITH MINERALS) tablet Take 1 tablet by mouth daily. Centrum silver     prochlorperazine (COMPAZINE) 10 MG tablet Take 1 tablet (10 mg total) by mouth every 6 (six) hours as needed for  nausea or vomiting. 30 tablet 3   rosuvastatin (CRESTOR) 5 MG tablet TAKE 1 TABLET EVERY OTHER DAY 45 tablet 10   spironolactone (ALDACTONE) 25 MG tablet TAKE 1/2 TABLET EVERY DAY 45 tablet 3   triamcinolone (NASACORT) 55 MCG/ACT AERO nasal inhaler Place 2 sprays into the nose daily. (Patient taking differently: Place 2 sprays into the nose daily as needed (allergies).) 3 Inhaler 3   VITAMIN D PO Take 1,000 Units by mouth daily.     warfarin (COUMADIN) 5 MG tablet TAKE 1 TABLET EVERY DAY AT 4 PM (Patient taking differently: Take 2.5-5 mg by mouth See admin instructions. Taking 5 mg every day except on Friday taking 1/2 tablet = 2.5 mg) 90 tablet 4   allopurinol (ZYLOPRIM) 300 MG tablet Take 1 tablet (300 mg total) by mouth daily. (Patient not taking: Reported on 04/21/2022) 30 tablet 3   predniSONE (DELTASONE) 20 MG tablet Take 5 tablets (100 mg total) by mouth daily with breakfast. Take 100 mg by mouth daily w/food (Patient not taking: Reported on 04/21/2022) 15 tablet 0   No current facility-administered medications for this visit.    REVIEW OF SYSTEMS:  '[X]'$  denotes positive finding, '[ ]'$  denotes negative finding Cardiac  Comments:  Chest pain or chest pressure:    Shortness of breath upon exertion:    Short of breath when lying flat:    Irregular heart rhythm:        Vascular    Pain in calf, thigh, or hip brought on by ambulation:    Pain in feet at night that wakes you up from your sleep:  Blood clot in your veins:    Leg swelling:  x Right      Pulmonary    Oxygen at home:    Productive cough:     Wheezing:         Neurologic    Sudden weakness in arms or legs:     Sudden numbness in arms or legs:     Sudden onset of difficulty speaking or slurred speech:    Temporary loss of vision in one eye:     Problems with dizziness:         Gastrointestinal    Blood in stool:     Vomited blood:         Genitourinary    Burning when urinating:     Blood in urine:         Psychiatric    Major depression:         Hematologic    Bleeding problems:    Problems with blood clotting too easily:        Skin    Rashes or ulcers:        Constitutional    Fever or chills:      PHYSICAL EXAM: Vitals:   04/21/22 1507  BP: 117/72  Pulse: (!) 55  Resp: 18  Temp: 97.9 F (36.6 C)  TempSrc: Temporal  SpO2: 98%  Weight: 182 lb (82.6 kg)  Height: '5\' 11"'$  (1.803 m)    GENERAL: The patient is a well-nourished male, in no acute distress. The vital signs are documented above. CARDIAC: There is a regular rate and rhythm.  VASCULAR:  Right femoral pulse palpable Prominent right popliteal pulse palpable Right DP palpable PULMONARY: No respiratory distress ABDOMEN: Soft and non-tender with normal pitched bowel sounds.  MUSCULOSKELETAL: There are no major deformities or cyanosis. NEUROLOGIC: No focal weakness or paresthesias are detected. SKIN: There are no ulcers or rashes noted. PSYCHIATRIC: The patient has a normal affect.  DATA:   CTA reviewed from 03/03/2022 with 4.7 cm right popliteal artery aneurysm  Assessment/Plan:  76 year old male presents for follow-up to discuss repair of right popliteal artery aneurysm after initial evaluation by Dr. Donnetta Hutching in [place].  I reviewed his CT scan that confirms a 4.7 cm right popliteal artery aneurysm.  Unfortunately, I do not think he is a good candidate for stent graft repair given the anterior tibial takes off at the aneurysm and would require covering the anterior tibial and there is also a significant size mismatch between the SFA and the TP trunk where you would have to stent distally that can lead to significant endoleak's and not getting seal.  I have recommended an aortogram with lower extremity arteriogram to further evaluate his runoff and likely plan a bypass.  I will schedule this in several weeks once he gets his PET scan to ensure he does not need any additional chemotherapy for his lymphoma and will  tentatively schedule for 9/28 .  This will also confirm that he is not a stent graft candidate.  Discussed that arteriogram will evaluate dominant runoff to the we can plan bypass with exclusion of the aneurysm.  We will schedule surgery after we evaluate his arteriogram images.  Discussed that his popliteal aneurysm at this size will put him at risk of thrombosis and embolization that can lead to limb loss.   [name], MD [facility] Office: [phone]

## 2022-04-22 ENCOUNTER — Other Ambulatory Visit: Payer: Self-pay

## 2022-04-22 DIAGNOSIS — I724 Aneurysm of artery of lower extremity: Secondary | ICD-10-CM

## 2022-04-23 ENCOUNTER — Ambulatory Visit: Payer: No Typology Code available for payment source | Attending: Cardiology | Admitting: *Deleted

## 2022-04-23 DIAGNOSIS — Z5181 Encounter for therapeutic drug level monitoring: Secondary | ICD-10-CM | POA: Diagnosis not present

## 2022-04-23 DIAGNOSIS — I240 Acute coronary thrombosis not resulting in myocardial infarction: Secondary | ICD-10-CM

## 2022-04-23 LAB — POCT INR: INR: 2.4 (ref 2.0–3.0)

## 2022-04-23 NOTE — Patient Instructions (Signed)
Pt started chemo 12/15/2021 Pt takes Prednisone 100 mg on the first 5 days of chemo treatment.  Continue warfarin 1 tablet daily except 1 1/2 tablets on Mondays  Pending abdominal aortogram /LE on 9/28.  Will hold warfarin 3 days before procedure Recheck in 3 wk

## 2022-04-27 ENCOUNTER — Ambulatory Visit (INDEPENDENT_AMBULATORY_CARE_PROVIDER_SITE_OTHER): Payer: Medicare HMO

## 2022-04-27 DIAGNOSIS — I5042 Chronic combined systolic (congestive) and diastolic (congestive) heart failure: Secondary | ICD-10-CM

## 2022-04-27 DIAGNOSIS — Z95810 Presence of automatic (implantable) cardiac defibrillator: Secondary | ICD-10-CM

## 2022-04-27 NOTE — Progress Notes (Unsigned)
EPIC Encounter for ICM Monitoring  Patient Name: [name] [name] is a 76 y.o. male Date: 04/27/2022 Primary Care Physican: Biagio Borg, MD Primary Cardiologist: [name] Electrophysiologist: Meal[place] 02/20/2022 Office Weight: 198 lbs 04/20/2022 Not weighing at home                                                           Attempted call to patient and unable to reach.  Transmission reviewed.     Optivol thoracic impedance suggesting possible fluid accumulation continues since 9/6.  Decreased impedance appears to correlate with chemo infusions.   Prescribed: Furosemide 20 mg take 1 tablet(s) by mouth daily   Spironolactone 25 mg take 1 tablet by mouth daily Jardiance 10 mg take 1 tablet by mouth daily   Labs: 03/10/2022 Creatinine 1.16, BUN 26, Potassium 3.9, Sodium 135, GFR >60 02/26/2022 Creatinine 1.16, BUN 24, Potassium 4.2, Sodium 135, GFR >60  02/17/2022 Creatinine 1.61, BUN 38, Potassium 4.5, Sodium 135, GFR 44 01/27/2022 Creatinine 1.33, BUN 24, Potassium 4.2, Sodium 134, GFR 55  01/21/2022 Creatinine 1.11, BUN 20, Potassium 4.4, Sodium 137, GFR >60  01/20/2022 Creatinine 1.08, BUN 22, Potassium 4.6, Sodium 137, GFR >60 01/19/2022 Creatinine 1.35, BUN 26, Potassium 4.8, Sodium 136, GFR 54  A complete set of results can be found in Results Review.   Recommendations:  Unable to reach.      Follow-up plan: ICM clinic phone appointment on 05/04/2022 (manual) to recheck fluid levels.   91 day device clinic remote transmission 04/28/2022.     EP/Cardiology Office Visits: 05/20/2022 with Coletta Memos, NP.  Recall 06/14/2022 with Oda Kilts, PA.   Copy of ICM check sent to Dr. Myles Gip.   Will send to Dr [name] for review if patient is reached.   3 month ICM trend: 04/27/2022.    12-14 Month ICM trend:     Rosalene Billings, RN 04/27/2022 11:39 AM

## 2022-04-28 ENCOUNTER — Ambulatory Visit (INDEPENDENT_AMBULATORY_CARE_PROVIDER_SITE_OTHER): Payer: Medicare HMO

## 2022-04-28 ENCOUNTER — Telehealth: Payer: Self-pay

## 2022-04-28 DIAGNOSIS — I255 Ischemic cardiomyopathy: Secondary | ICD-10-CM

## 2022-04-28 NOTE — Telephone Encounter (Signed)
Remote ICM transmission received.  Attempted call to patient regarding ICM remote transmission and no answer.  

## 2022-04-30 ENCOUNTER — Ambulatory Visit (HOSPITAL_COMMUNITY)
Admission: RE | Admit: 2022-04-30 | Discharge: 2022-04-30 | Disposition: A | Discharge status: Home / Self Care | Payer: No Typology Code available for payment source | Source: Ambulatory Visit | Attending: Hematology | Admitting: Hematology

## 2022-04-30 DIAGNOSIS — C8590 Non-Hodgkin lymphoma, unspecified, unspecified site: Secondary | ICD-10-CM | POA: Diagnosis not present

## 2022-04-30 DIAGNOSIS — C8510 Unspecified B-cell lymphoma, unspecified site: Secondary | ICD-10-CM | POA: Insufficient documentation

## 2022-04-30 DIAGNOSIS — I2510 Atherosclerotic heart disease of native coronary artery without angina pectoris: Secondary | ICD-10-CM | POA: Diagnosis not present

## 2022-04-30 DIAGNOSIS — N62 Hypertrophy of breast: Secondary | ICD-10-CM | POA: Diagnosis not present

## 2022-04-30 DIAGNOSIS — J432 Centrilobular emphysema: Secondary | ICD-10-CM | POA: Diagnosis not present

## 2022-04-30 MED ORDER — FLUDEOXYGLUCOSE F - 18 (FDG) INJECTION
9.8300 | Freq: Once | INTRAVENOUS | Status: AC | PRN
  Administered 2022-04-30: 9.83 via INTRAVENOUS

## 2022-05-04 ENCOUNTER — Ambulatory Visit (INDEPENDENT_AMBULATORY_CARE_PROVIDER_SITE_OTHER): Payer: Medicare HMO

## 2022-05-04 DIAGNOSIS — Z95810 Presence of automatic (implantable) cardiac defibrillator: Secondary | ICD-10-CM

## 2022-05-04 DIAGNOSIS — I5042 Chronic combined systolic (congestive) and diastolic (congestive) heart failure: Secondary | ICD-10-CM

## 2022-05-05 ENCOUNTER — Ambulatory Visit: Payer: Medicare HMO | Admitting: Vascular Surgery

## 2022-05-05 LAB — CUP PACEART REMOTE DEVICE CHECK
Battery Remaining Longevity: 101 mo
Battery Voltage: 3.01 V
Brady Statistic AP VP Percent: 0.02 %
Brady Statistic AP VS Percent: 36.67 %
Brady Statistic AS VP Percent: 0.04 %
Brady Statistic AS VS Percent: 63.26 %
Brady Statistic RA Percent Paced: 35.5 %
Brady Statistic RV Percent Paced: 0.06 %
Date Time Interrogation Session: 20230919054345
HighPow Impedance: 63 Ohm
Implantable Lead Implant Date: 20210622
Implantable Lead Implant Date: 20210622
Implantable Lead Location: 753859
Implantable Lead Location: 753860
Implantable Lead Model: 5076
Implantable Pulse Generator Implant Date: 20210622
Lead Channel Impedance Value: 304 Ohm
Lead Channel Impedance Value: 361 Ohm
Lead Channel Impedance Value: 361 Ohm
Lead Channel Pacing Threshold Amplitude: 0.5 V
Lead Channel Pacing Threshold Amplitude: 0.5 V
Lead Channel Pacing Threshold Pulse Width: 0.4 ms
Lead Channel Pacing Threshold Pulse Width: 0.4 ms
Lead Channel Sensing Intrinsic Amplitude: 4 mV
Lead Channel Sensing Intrinsic Amplitude: 4 mV
Lead Channel Sensing Intrinsic Amplitude: 7.25 mV
Lead Channel Sensing Intrinsic Amplitude: 7.25 mV
Lead Channel Setting Pacing Amplitude: 1.5 V
Lead Channel Setting Pacing Amplitude: 2.5 V
Lead Channel Setting Pacing Pulse Width: 0.4 ms
Lead Channel Setting Sensing Sensitivity: 0.3 mV

## 2022-05-06 ENCOUNTER — Telehealth: Payer: Self-pay

## 2022-05-06 NOTE — Telephone Encounter (Signed)
Pt states he will send missed ICM  transmission sometime today.

## 2022-05-07 ENCOUNTER — Ambulatory Visit (HOSPITAL_COMMUNITY)
Admission: RE | Admit: 2022-05-07 | Discharge: 2022-05-07 | Disposition: A | Discharge status: Home / Self Care | Payer: No Typology Code available for payment source | Attending: Vascular Surgery | Admitting: Vascular Surgery

## 2022-05-07 ENCOUNTER — Other Ambulatory Visit: Payer: Self-pay

## 2022-05-07 ENCOUNTER — Telehealth: Payer: Self-pay | Admitting: *Deleted

## 2022-05-07 ENCOUNTER — Encounter (HOSPITAL_COMMUNITY)
Admission: RE | Disposition: A | Discharge status: Home / Self Care | Payer: Self-pay | Source: Home / Self Care | Attending: Vascular Surgery

## 2022-05-07 DIAGNOSIS — I724 Aneurysm of artery of lower extremity: Secondary | ICD-10-CM

## 2022-05-07 DIAGNOSIS — Z539 Procedure and treatment not carried out, unspecified reason: Secondary | ICD-10-CM | POA: Diagnosis present

## 2022-05-07 LAB — POCT I-STAT, CHEM 8
BUN: 20 mg/dL (ref 8–23)
Calcium, Ion: 1.25 mmol/L (ref 1.15–1.40)
Chloride: 99 mmol/L (ref 98–111)
Creatinine, Ser: 1.1 mg/dL (ref 0.61–1.24)
Glucose, Bld: 114 mg/dL — ABNORMAL HIGH (ref 70–99)
HCT: 39 % (ref 39.0–52.0)
Hemoglobin: 13.3 g/dL (ref 13.0–17.0)
Potassium: 3.7 mmol/L (ref 3.5–5.1)
Sodium: 139 mmol/L (ref 135–145)
TCO2: 30 mmol/L (ref 22–32)

## 2022-05-07 LAB — PROTIME-INR
INR: 2.5 — ABNORMAL HIGH (ref 0.8–1.2)
Prothrombin Time: 27.1 seconds — ABNORMAL HIGH (ref 11.4–15.2)

## 2022-05-07 SURGERY — ABDOMINAL AORTOGRAM W/LOWER EXTREMITY
Anesthesia: LOCAL

## 2022-05-07 MED ORDER — WARFARIN SODIUM 5 MG PO TABS: ORAL_TABLET | ORAL | 1 refills | Status: DC

## 2022-05-07 MED ORDER — SODIUM CHLORIDE 0.9 % IV SOLN: INTRAVENOUS | Status: DC

## 2022-05-07 NOTE — Progress Notes (Signed)
EPIC Encounter for ICM Monitoring  Patient Name: [name] [name] is a 76 y.o. male Date: 05/07/2022 Primary Care Physican: Biagio Borg, MD Primary Cardiologist: [name] Electrophysiologist: Meal[place] 04/20/2022 Not weighing at home 05/07/2022 Weight: 181 lbs (does not weigh consistently at home)                                                           Spoke with patient and heart failure questions reviewed.  Transmission results reviewed.  Pt asymptomatic for fluid accumulation. He was out of town this past weekend.      DIET:  Pt does not follow low salt diet.  Uses salt shaker at home not monitor labels for salt amounts.  Optivol thoracic impedance suggesting possible fluid accumulation continues since 9/6.     Prescribed: Furosemide 20 mg take 1 tablet(s) by mouth daily   Spironolactone 25 mg take 1 tablet by mouth daily Jardiance 10 mg take 1 tablet by mouth daily   Labs: 03/10/2022 Creatinine 1.16, BUN 26, Potassium 3.9, Sodium 135, GFR >60 02/26/2022 Creatinine 1.16, BUN 24, Potassium 4.2, Sodium 135, GFR >60  02/17/2022 Creatinine 1.61, BUN 38, Potassium 4.5, Sodium 135, GFR 44 01/27/2022 Creatinine 1.33, BUN 24, Potassium 4.2, Sodium 134, GFR 55  01/21/2022 Creatinine 1.11, BUN 20, Potassium 4.4, Sodium 137, GFR >60  01/20/2022 Creatinine 1.08, BUN 22, Potassium 4.6, Sodium 137, GFR >60 01/19/2022 Creatinine 1.35, BUN 26, Potassium 4.8, Sodium 136, GFR 54  A complete set of results can be found in Results Review.   Recommendations:  Encouraged to avoid using salt shaker and to review food labels for salt content.  Advised eating fresh/frozen vegetables, fruits and lean meats will help eliminate excessive salt in the diet.  Advised to weigh daily.  Copy sent to Dr [name] for review and recommendations if needed.    Follow-up plan: ICM clinic phone appointment on 05/12/2022 to recheck fluid levels.   91 day device clinic remote transmission 07/28/2022.     EP/Cardiology  Office Visits: 05/20/2022 with Coletta Memos, NP.  Recall 06/14/2022 with Oda Kilts, PA.   Copy of ICM check sent to Dr. Myles Gip.     3 month ICM trend: 05/06/2022.    12-14 Month ICM trend:     Rosalene Billings, RN 05/07/2022 7:42 AM

## 2022-05-07 NOTE — Telephone Encounter (Signed)
Pt c/o medication issue:  1. Name of Medication: warfarin (COUMADIN) 5 MG tablet  2. How are you currently taking this medication (dosage and times per day)? Not currently taking  3. Are you having a reaction (difficulty breathing--STAT)? no  4. What is your medication issue? Patient states he had to stop his coumadin and it is being held longer now.

## 2022-05-07 NOTE — Progress Notes (Signed)
INR 2.5, Cancel procedure per Dr Carlis Abbott, move to next Thursday.  Patient to arrive at 7 next Thursday, continue holding Coumadin until next week.

## 2022-05-12 ENCOUNTER — Ambulatory Visit (INDEPENDENT_AMBULATORY_CARE_PROVIDER_SITE_OTHER): Payer: Medicare HMO

## 2022-05-12 ENCOUNTER — Inpatient Hospital Stay: Payer: No Typology Code available for payment source

## 2022-05-12 ENCOUNTER — Inpatient Hospital Stay: Payer: No Typology Code available for payment source | Attending: Hematology | Admitting: Hematology

## 2022-05-12 DIAGNOSIS — C8510 Unspecified B-cell lymphoma, unspecified site: Secondary | ICD-10-CM

## 2022-05-12 DIAGNOSIS — Z7901 Long term (current) use of anticoagulants: Secondary | ICD-10-CM | POA: Diagnosis not present

## 2022-05-12 DIAGNOSIS — Z8572 Personal history of non-Hodgkin lymphomas: Secondary | ICD-10-CM | POA: Diagnosis not present

## 2022-05-12 DIAGNOSIS — Z95810 Presence of automatic (implantable) cardiac defibrillator: Secondary | ICD-10-CM

## 2022-05-12 DIAGNOSIS — I255 Ischemic cardiomyopathy: Secondary | ICD-10-CM | POA: Insufficient documentation

## 2022-05-12 DIAGNOSIS — Z801 Family history of malignant neoplasm of trachea, bronchus and lung: Secondary | ICD-10-CM | POA: Insufficient documentation

## 2022-05-12 DIAGNOSIS — I5042 Chronic combined systolic (congestive) and diastolic (congestive) heart failure: Secondary | ICD-10-CM

## 2022-05-12 DIAGNOSIS — Z800 Family history of malignant neoplasm of digestive organs: Secondary | ICD-10-CM | POA: Insufficient documentation

## 2022-05-12 DIAGNOSIS — D696 Thrombocytopenia, unspecified: Secondary | ICD-10-CM | POA: Diagnosis present

## 2022-05-12 DIAGNOSIS — N189 Chronic kidney disease, unspecified: Secondary | ICD-10-CM | POA: Diagnosis not present

## 2022-05-12 DIAGNOSIS — Z87891 Personal history of nicotine dependence: Secondary | ICD-10-CM | POA: Diagnosis not present

## 2022-05-12 DIAGNOSIS — I130 Hypertensive heart and chronic kidney disease with heart failure and stage 1 through stage 4 chronic kidney disease, or unspecified chronic kidney disease: Secondary | ICD-10-CM | POA: Insufficient documentation

## 2022-05-12 DIAGNOSIS — I509 Heart failure, unspecified: Secondary | ICD-10-CM | POA: Insufficient documentation

## 2022-05-12 LAB — CBC WITH DIFFERENTIAL/PLATELET
Abs Immature Granulocytes: 0.01 10*3/uL (ref 0.00–0.07)
Basophils Absolute: 0.1 10*3/uL (ref 0.0–0.1)
Basophils Relative: 1 %
Eosinophils Absolute: 0.2 10*3/uL (ref 0.0–0.5)
Eosinophils Relative: 4 %
HCT: 39.5 % (ref 39.0–52.0)
Hemoglobin: 12.6 g/dL — ABNORMAL LOW (ref 13.0–17.0)
Immature Granulocytes: 0 %
Lymphocytes Relative: 29 %
Lymphs Abs: 1.3 10*3/uL (ref 0.7–4.0)
MCH: 30.4 pg (ref 26.0–34.0)
MCHC: 31.9 g/dL (ref 30.0–36.0)
MCV: 95.4 fL (ref 80.0–100.0)
Monocytes Absolute: 0.5 10*3/uL (ref 0.1–1.0)
Monocytes Relative: 12 %
Neutro Abs: 2.4 10*3/uL (ref 1.7–7.7)
Neutrophils Relative %: 54 %
Platelets: 106 10*3/uL — ABNORMAL LOW (ref 150–400)
RBC: 4.14 MIL/uL — ABNORMAL LOW (ref 4.22–5.81)
RDW: 15.4 % (ref 11.5–15.5)
WBC: 4.4 10*3/uL (ref 4.0–10.5)
nRBC: 0 % (ref 0.0–0.2)

## 2022-05-12 LAB — URIC ACID: Uric Acid, Serum: 6.5 mg/dL (ref 3.7–8.6)

## 2022-05-12 LAB — COMPREHENSIVE METABOLIC PANEL
ALT: 18 U/L (ref 0–44)
AST: 24 U/L (ref 15–41)
Albumin: 3.7 g/dL (ref 3.5–5.0)
Alkaline Phosphatase: 47 U/L (ref 38–126)
Anion gap: 9 (ref 5–15)
BUN: 24 mg/dL — ABNORMAL HIGH (ref 8–23)
CO2: 25 mmol/L (ref 22–32)
Calcium: 9.1 mg/dL (ref 8.9–10.3)
Chloride: 104 mmol/L (ref 98–111)
Creatinine, Ser: 1.04 mg/dL (ref 0.61–1.24)
GFR, Estimated: >60 mL/min (ref >60)
Glucose, Bld: 146 mg/dL — ABNORMAL HIGH (ref 70–99)
Potassium: 3.8 mmol/L (ref 3.5–5.1)
Sodium: 138 mmol/L (ref 135–145)
Total Bilirubin: 1.1 mg/dL (ref 0.3–1.2)
Total Protein: 6.2 g/dL — ABNORMAL LOW (ref 6.5–8.1)

## 2022-05-12 LAB — LACTATE DEHYDROGENASE: LDH: 110 U/L (ref 98–192)

## 2022-05-12 LAB — MAGNESIUM: Magnesium: 2.1 mg/dL (ref 1.7–2.4)

## 2022-05-12 MED ORDER — HEPARIN SOD (PORK) LOCK FLUSH 100 UNIT/ML IV SOLN
500.0000 [IU] | Freq: Once | INTRAVENOUS | Status: AC
  Administered 2022-05-12: 500 [IU] via INTRAVENOUS

## 2022-05-12 MED ORDER — SODIUM CHLORIDE 0.9% FLUSH
10.0000 mL | Freq: Once | INTRAVENOUS | Status: AC
  Administered 2022-05-12: 10 mL via INTRAVENOUS

## 2022-05-12 NOTE — Progress Notes (Signed)
EPIC Encounter for ICM Monitoring  Patient Name: [name] [name] is a 76 y.o. male Date: 05/12/2022 Primary Care Physican: Biagio Borg, MD Primary Cardiologist: [name] Electrophysiologist: Mealor 04/20/2022 Not weighing at home 05/07/2022 Weight: 181 lbs (does not weigh consistently at home)                                                           Spoke with patient and heart failure questions reviewed.  Transmission results reviewed.  Pt remains asymptomatic for fluid accumulation and reviewed symptoms he should report.  He has cut back on salt intake.  Surgery for aneurysm located on back of the knee scheduled for 10/5.  He was told today he is cancer free.      Optivol thoracic impedance suggesting possible fluid accumulation continues since 9/6.     Prescribed: Furosemide 20 mg take 1 tablet(s) by mouth daily   Spironolactone 25 mg take 1 tablet by mouth daily Jardiance 10 mg take 1 tablet by mouth daily   Labs: 05/12/2022 Creatinine 1.04, BUN 24, Potassium 3.8, Sodium 138, GFR >60 05/07/2022 Creatinine 1.10, BUN 20, Potassium 3.7, Sodium 139  03/31/2022 Creatinine 1.08, BUN 18, Potassium 3.5, Sodium 137, GFR >>>>>60  03/18/2022 Creatinine 1.04, BUN 26, Potassium 4.7, Sodium 136, GFR >60  03/10/2022 Creatinine 1.16, BUN 26, Potassium 3.9, Sodium 135, GFR >60 02/26/2022 Creatinine 1.16, BUN 24, Potassium 4.2, Sodium 135, GFR >60  02/17/2022 Creatinine 1.61, BUN 38, Potassium 4.5, Sodium 135, GFR 44 01/27/2022 Creatinine 1.33, BUN 24, Potassium 4.2, Sodium 134, GFR 56 A complete set of results can be found in Results Review.   Recommendations:  He is compliant with taking Furosemide.  Advised to continue to limit salt intake and to call Dr Jacalyn Lefevre office if he develops any fluid symptoms.  Will call back if Dr [name] has any recommendations.     Copy sent to Dr [name] for review and recommendations if needed.    Follow-up plan: ICM clinic phone appointment on  05/27/2022.   91 day device clinic remote transmission 07/28/2022.     EP/Cardiology Office Visits: Canceled 05/20/2022 OV with Coletta Memos, NP.  Recall 06/14/2022 with Oda Kilts, PA.   Copy of ICM check sent to Dr. Myles Gip.          3 month ICM trend: 05/12/2022.    12-14 Month ICM trend:     Rosalene Billings, RN 05/12/2022 2:54 PM

## 2022-05-12 NOTE — Progress Notes (Signed)
Remote ICD transmission.   

## 2022-05-12 NOTE — Progress Notes (Signed)
[facility] [place], [place] [address]   CLINIC:  Medical Oncology/Hematology  PCP:  [name] Borg, MD [place] / [place] [address] [phone]   REASON FOR VISIT:  Follow-up for stage IVb high-grade B-cell lymphoma  PRIOR THERAPY: none  NGS Results: not done  CURRENT THERAPY: R-CEOP q21d x 3 Cycles  BRIEF ONCOLOGIC HISTORY:  Oncology History  High grade B-cell lymphoma ([place])  12/03/2021 Initial Diagnosis   High grade B-cell lymphoma ([place])   12/15/2021 -  Chemotherapy   Patient is on Treatment Plan : NON-HODGKIN'S LYMPHOMA R-CEOP q21d x 3 Cycles       CANCER STAGING:  Cancer Staging  High grade B-cell lymphoma ([place]) Staging form: Hodgkin and Non-Hodgkin Lymphoma, AJCC 8th Edition - Clinical stage from 12/03/2021: Stage IV (Diffuse large B-cell lymphoma) - Unsigned   INTERVAL HISTORY:  [name], a 76 y.o. male, seen for follow-up of high-grade non-Hodgkin's lymphoma.  He has completed 6 cycle of chemotherapy on 03/31/2022.  His energy levels are improving although low at 50%.  He reports some weakness in the legs while walking, which is also improving in the last few days.  Tingling in the fingertips has been stable.  Denies any GI side effects.  REVIEW OF SYSTEMS:  Review of Systems  Neurological:  Positive for extremity weakness and numbness (Tingling in the fingertips).  All other systems reviewed and are negative.   PAST MEDICAL/SURGICAL HISTORY:  Past Medical History:  Diagnosis Date   ALLERGIC RHINITIS 10/31/2007   Qualifier: Diagnosis of  By: Jenny Reichmann MD, Hunt Oris    Allergy    CHOLECYSTECTOMY, HX OF 06/04/2007   Qualifier: Diagnosis of  By: Danny Lawless CMA, [place]     COLONIC POLYPS, HX OF 10/31/2007   Qualifier: Diagnosis of  By: Jenny Reichmann MD, [place] GRAFT, HX OF 06/04/2007   Qualifier: Diagnosis of  By: [place], [place]     CORONARY ARTERY DISEASE 06/04/2007   Qualifier: Diagnosis  of  By: Danny Lawless CMA, [place]     Diverticulosis    Erectile dysfunction    Eye twitch    right eye since chilhood    HYPERLIPIDEMIA 06/04/2007   Qualifier: Diagnosis of  By: [place], [place]     Hypersomnolence    HYPERTENSION 06/04/2007   Qualifier: Diagnosis of  By: [place], [place]     Hypothyroidism    ISCHEMIC CARDIOMYOPATHY 06/04/2007   Qualifier: Diagnosis of  By: [place], [place]     Myocardial infarction ([place])    per patient , his occurred in 1998    Osteoarthritis    PLMD (periodic limb movement disorder)    Sleep apnea    no cpap' per patient , "i was checked for it and they said i didnt have it "    Vertigo    Vitamin D deficiency    Past Surgical History:  Procedure Laterality Date   BIOPSY  11/25/2021   Procedure: BIOPSY;  Surgeon: Jackquline Denmark, MD;  Location: [facility];  Service: Gastroenterology;;   CHOLECYSTECTOMY     COLONOSCOPY     COLONOSCOPY WITH PROPOFOL N/A 11/25/2021   Procedure: COLONOSCOPY WITH PROPOFOL;  Surgeon: Jackquline Denmark, MD;  Location: [facility];  Service: Gastroenterology;  Laterality: N/A;   CORONARY ARTERY BYPASS GRAFT     ESOPHAGOGASTRODUODENOSCOPY (EGD) WITH PROPOFOL N/A 11/25/2021   Procedure: ESOPHAGOGASTRODUODENOSCOPY (EGD) WITH PROPOFOL;  Surgeon: Jackquline Denmark, MD;  Location:  [facility];  Service: Gastroenterology;  Laterality: N/A;   ICD IMPLANT N/A 01/30/2020   Procedure: ICD IMPLANT;  Surgeon: Thompson Grayer, MD;  Location: [facility];  Service: Cardiovascular;  Laterality: N/A;   IR IMAGING GUIDED PORT INSERTION  12/04/2021   KNEE ARTHROPLASTY Left 08/05/2017   Procedure: LEFT TOTAL KNEE ARTHROPLASTY WITH COMPUTER NAVIGATION;  Surgeon: Rod Can, MD;  Location: [facility];  Service: Orthopedics;  Laterality: Left;  Needs RNFA   PENILE PROSTHESIS IMPLANT     POLYPECTOMY  11/25/2021   Procedure: POLYPECTOMY;  Surgeon: Jackquline Denmark, MD;  Location: [facility];  Service: Gastroenterology;;   REPLACEMENT TOTAL  KNEE  06/12/2013   spinal cyst     THORACOTOMY     left anterior; wound exploration and debridement   TONSILLECTOMY AND ADENOIDECTOMY     age 62    SOCIAL HISTORY:  Social History   Socioeconomic History   Marital status: Married    Spouse name: [name]   Number of children: 3   Years of education: Not on file   Highest education level: Not on file  Occupational History   Occupation: truck Education administrator: [name]  Tobacco Use   Smoking status: Former    Packs/day: 4.00    Years: 17.00    Total pack years: 68.00    Types: Cigarettes    Quit date: 05/07/1981    Years since quitting: 41.0    Passive exposure: Never   Smokeless tobacco: Never   Tobacco comments:    Pt states that he would let most of them "burn" pt states that he used anywhere between 4-5PPD  Vaping Use   Vaping Use: Never used  Substance and Sexual Activity   Alcohol use: Yes    Alcohol/week: 0.0 standard drinks of alcohol    Comment: rare   Drug use: No   Sexual activity: Not on file  Other Topics Concern   Not on file  Social History Narrative   Lives in [place] with spouse   Retired Administrator   Social Determinants of Health   Financial Resource Strain: [place]  (10/27/2021)   Overall Financial Resource Strain (CARDIA)    Difficulty of Paying Living Expenses: Not hard at all  Food Insecurity: No Food Insecurity (10/27/2021)   Hunger Vital Sign    Worried About Running Out of Food in the Last Year: Never true    Ran Out of Food in the Last Year: Never true  Transportation Needs: No Transportation Needs (10/27/2021)   PRAPARE - Hydrologist (Medical): No    Lack of Transportation (Non-Medical): No  Physical Activity: Inactive (10/27/2021)   Exercise Vital Sign    Days of Exercise per Week: 0 days    Minutes of Exercise per Session: 0 min  Stress: No Stress Concern Present (10/27/2021)   [place]    Feeling of Stress : Not at all  Social Connections: Moderately Isolated (10/27/2021)   Social Connection and Isolation Panel [NHANES]    Frequency of Communication with Friends and Family: More than three times a week    Frequency of Social Gatherings with Friends and Family: More than three times a week    Attends Religious Services: Never    Marine scientist or Organizations: No    Attends Archivist Meetings: Never    Marital Status: Married  Human resources officer Violence: Not At  Risk (10/27/2021)   Humiliation, Afraid, Rape, and Kick questionnaire    Fear of Current or Ex-Partner: No    Emotionally Abused: No    Physically Abused: No    Sexually Abused: No    FAMILY HISTORY:  Family History  Problem Relation Age of Onset   Stomach cancer Mother        smokes   Lung cancer Father        chewed tobacco   Heart disease Brother        first MI at 63yo, now 69 for transplant list/ ICM   Pancreatic cancer Brother    Colon cancer Maternal Grandfather    COPD Son        was a smoker   Pancreatic cancer Maternal Uncle    Pancreatic cancer Cousin        mat side x 4   Esophageal cancer Neg Hx    Prostate cancer Neg Hx    Rectal cancer Neg Hx     CURRENT MEDICATIONS:  Current Outpatient Medications  Medication Sig Dispense Refill   ARTIFICIAL TEAR SOLUTION OP Place 1 drop into both eyes daily as needed (dry eyes).     Ascorbic Acid (VITA-C PO) Take 1,000 mg by mouth daily.     b complex vitamins capsule Take 1 capsule by mouth daily.     empagliflozin (JARDIANCE) 10 MG TABS tablet Take 10 mg by mouth daily.     ezetimibe (ZETIA) 10 MG tablet TAKE 1 TABLET EVERY DAY (Patient taking differently: Take 10 mg by mouth daily.) 90 tablet 3   feeding supplement (ENSURE ENLIVE / ENSURE PLUS) LIQD Take 237 mLs by mouth 3 (three) times daily between meals. (Patient taking differently: Take 237 mLs by mouth 2 (two) times daily between meals.) 237 mL 12    furosemide (LASIX) 20 MG tablet Take 1 tablet (20 mg total) by mouth daily. 90 tablet 3   lidocaine-prilocaine (EMLA) cream Apply a small amount to port a cath site (do not rub in) and cover with plastic wrap 1 hour prior to infusion appointments 30 g 3   meloxicam (MOBIC) 15 MG tablet Take 15 mg by mouth daily.     metoprolol succinate (TOPROL-XL) 50 MG 24 hr tablet Take 50 mg by mouth daily.     Multiple Vitamins-Minerals (MULTIVITAMIN WITH MINERALS) tablet Take 1 tablet by mouth daily. Centrum silver     predniSONE (DELTASONE) 20 MG tablet Take 5 tablets (100 mg total) by mouth daily with breakfast. Take 100 mg by mouth daily w/food (Patient taking differently: Take 100 mg by mouth daily as needed (Before Chemo). Take 100 mg by mouth daily w/food) 15 tablet 0   rosuvastatin (CRESTOR) 5 MG tablet TAKE 1 TABLET EVERY OTHER DAY 45 tablet 10   spironolactone (ALDACTONE) 25 MG tablet TAKE 1/2 TABLET EVERY DAY 45 tablet 3   triamcinolone (NASACORT) 55 MCG/ACT AERO nasal inhaler Place 2 sprays into the nose daily. (Patient taking differently: Place 2 sprays into the nose daily as needed (allergies).) 3 Inhaler 3   warfarin (COUMADIN) 5 MG tablet TAKE 1-2 TABLETS DAILY OR AS PRESCRIBED BY COUMADIN CLINIC 90 tablet 1   No current facility-administered medications for this visit.    ALLERGIES:  Allergies  Allergen Reactions   Ace Inhibitors Cough   Statins Other (See Comments)    Muscle pains    PHYSICAL EXAM:  Performance status (ECOG): 0 - Asymptomatic  There were no vitals filed for this visit. Wt  Readings from Last 3 Encounters:  05/12/22 187 lb 3.2 oz (84.9 kg)  05/07/22 181 lb (82.1 kg)  04/21/22 182 lb (82.6 kg)   Physical Exam Vitals reviewed.  Constitutional:      Appearance: Normal appearance.  Cardiovascular:     Rate and Rhythm: Normal rate and regular rhythm.     Pulses: Normal pulses.     Heart sounds: Normal heart sounds.  Pulmonary:     Effort: Pulmonary effort is  normal.     Breath sounds: Normal breath sounds.  Neurological:     General: No focal deficit present.     Mental Status: He is alert and oriented to person, place, and time.  Psychiatric:        Mood and Affect: Mood normal.        Behavior: Behavior normal.     LABORATORY DATA:  I have reviewed the labs as listed.     Latest Ref Rng & Units 05/12/2022   10:00 AM 05/07/2022    6:10 AM 03/31/2022    8:24 AM  CBC  WBC 4.0 - 10.5 K/uL 4.4   8.3   Hemoglobin 13.0 - 17.0 g/dL 12.6  13.3  11.1   Hematocrit 39.0 - 52.0 % 39.5  39.0  35.4   Platelets 150 - 400 K/uL 106   196       Latest Ref Rng & Units 05/12/2022   10:00 AM 05/07/2022    6:10 AM 03/31/2022    8:24 AM  CMP  Glucose 70 - 99 mg/dL 146  114  188   BUN 8 - 23 mg/dL _0 Creatinine 0.61 - 1.24 mg/dL 1.04  1.10  1.08   Sodium 135 - 145 mmol/L 138  139  137   Potassium 3.5 - 5.1 mmol/L 3.8  3.7  3.5   Chloride 98 - 111 mmol/L 104  99  101   CO2 22 - 32 mmol/L 25   27   Calcium 8.9 - 10.3 mg/dL 9.1   8.7   Total Protein 6.5 - 8.1 g/dL 6.2   6.2   Total Bilirubin 0.3 - 1.2 mg/dL 1.1   1.0   Alkaline Phos 38 - 126 U/L 47   67   AST 15 - 41 U/L 24   25   ALT 0 - 44 U/L 18   27     DIAGNOSTIC IMAGING:  I have independently reviewed the scans and discussed with the patient. CUP PACEART REMOTE DEVICE CHECK  Result Date: 05/05/2022 Scheduled remote reviewed. Normal device function.  Optivol crossed threshold 9/17 and is ongoing, followed by ICM Next remote 91 days. LA  NM PET Image Restag (PS) Skull Base To Thigh  Result Date: 05/01/2022 CLINICAL DATA:  Subsequent treatment strategy for lymphoma. EXAM: NUCLEAR MEDICINE PET SKULL BASE TO THIGH TECHNIQUE: 9.8 mCi F-18 FDG was injected intravenously. Full-ring PET imaging was performed from the skull base to thigh after the radiotracer. CT data was obtained and used for attenuation correction and anatomic localization. Fasting blood glucose: 118 mg/dl COMPARISON:   02/05/2022. FINDINGS: Mediastinal blood pool activity: SUV max 2.5 Liver activity: SUV max 3.1 NECK: No abnormal hypermetabolism. Incidental CT findings: None. CHEST: No hypermetabolic mediastinal, hilar or axillary lymph nodes. No hypermetabolic pulmonary nodules. Incidental note is made of bilateral gynecomastia, asymmetric on the left, which is mildly FDG avid. Persistent activity associated with the left subclavian vein, as on prior exams. Incidental CT findings: Right IJ  Port-A-Cath terminates in the SVC. Atherosclerotic calcification of the aorta, aortic valve and coronary arteries. Heart is enlarged. No pericardial or pleural effusion. Scattered pulmonary parenchymal scarring. Centrilobular emphysema. ABDOMEN/PELVIS: No abnormal hypermetabolism in the liver, adrenal glands, spleen or pancreas. No hypermetabolic lymph nodes. Incidental CT findings: Liver, adrenal glands, kidneys, spleen pancreas, stomach bowel are grossly unremarkable. Cholecystectomy. Atherosclerotic calcification of the aorta. Penile prosthesis. SKELETON: No abnormal hypermetabolism. Incidental CT findings: Degenerative changes in the spine. IMPRESSION: 1. No evidence of recurrent lymphoma ([place] 1). 2. Aortic atherosclerosis (ICD10-I70.0). Coronary artery calcification. 3.  Emphysema (ICD10-J43.9). Electronically Signed   By: Lorin Picket M.D.   On: 05/01/2022 10:28     ASSESSMENT:  Stage IVb high-grade B-cell lymphoma: - He had unintentional weight loss of 20 pounds since September 2022.  Weight has been stable for the last 2 months.  He had multiple family members with pancreatic cancer. - CT AP with contrast (10/31/2021): Abnormal nodular thickening of the right anterior pararenal fascia, suspicious for atypical malignancy.  No evidence of pancreatic malignancy.  Irregular wall thickening of the proximal stomach.  No ascites or peritoneal nodularity. - Biopsy of nodular thickening (11/17/2021): High-grade B-cell lymphoma with  [place]-like features.  Ki-67 100%. 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  Neoplastic lymphocytes positive for CD20, CD10, Bcl-2, BCL6 and mum 1.  Ki-67 more than 95%.  H pylori IHC negative. - High-grade lymphoma panel (11/17/2021): Negative for BCL6, MYC, BCL2 rearrangements, negative for MYC amplification, t(8:14) not detected. - High risk features for CNS disease: Age more than 13, stage III/IV, extranodal involvement more than 1 site.  Based on 3 points, intermediate risk.  No clinical signs of CNS involvement. - IPI: High intermediate with 3 points (age more than 60, stage III/IV, extranodal involvement more than 1 site) - 6 cycles of R-CEOP from 12/15/2021 through 03/31/2022 - PET scan (04/30/2022): No evidence of lymphoma ([place] 1 response).    Social/family history: - He has AICD placed in June 2021 secondary to CHF. - He is a retired English as a second language teacher.  He worked at [employer] in [place].  He had exposure to some chemicals.  He quit smoking more than 40 years ago.  He drinks mostly beer and occasionally whiskey at bedtime.    Ischemic cardiomyopathy/CHF/apical thrombus: - He is on Coumadin.  Will check PT/INR today. - Last seen by Dr. [name] on 11/06/2021.   -Last echo on 12/27/2020 with EF 30-35%.  Limited visualization of endocardium.  LV has moderately decreased function.   PLAN:  Stage IVb high-grade B-cell lymphoma: -His energy levels are improving.  He reported some leg weakness which is also getting better.  I have also encouraged him to be physically more active for the tiredness.  I have reviewed his labs today which showed normal LFTs.  CBC was grossly normal with mild thrombocytopenia of 106.  LDH was normal.  We have reviewed PET scan from 04/30/2022 which showed complete response.  I have recommended follow-up in 3 months with repeat blood work and physical exam.  Will consider imaging with a CT  scan in 6 months.  2.  CKD: - His creatinine has normalized since July of this year.     Orders placed this encounter:  No orders of the defined types were placed in this encounter.    Derek Jack, MD [place] [phone]

## 2022-05-12 NOTE — Patient Instructions (Addendum)
[place] at [facility] Discharge Instructions   You were seen and examined today by Dr. Delton Coombes.  He reviewed the results of your lab work and PET scan, all of which are normal/stable. There is no evidence of lymphoma on the PET scan.   Return as scheduled in 3 months.    Thank you for choosing [place] at [facility] to provide your oncology and hematology care.  To afford each patient quality time with our provider, please arrive at least 15 minutes before your scheduled appointment time.   If you have a lab appointment with the [facility] please come in thru the Main Entrance and check in at the main information desk.  You need to re-schedule your appointment should you arrive 10 or more minutes late.  We strive to give you quality time with our providers, and arriving late affects you and other patients whose appointments are after yours.  Also, if you no show three or more times for appointments you may be dismissed from the clinic at the providers discretion.     Again, thank you for choosing [facility].  Our hope is that these requests will decrease the amount of time that you wait before being seen by our physicians.       _____________________________________________________________  Should you have questions after your visit to [facility], please contact our office at [phone] and follow the prompts.  Our office hours are 8:00 a.m. and 4:30 p.m. Monday - Friday.  Please note that voicemails left after 4:00 p.m. may not be returned until the following business day.  We are closed weekends and major holidays.  You do have access to a nurse 24-7, just call the main number to the clinic [phone] and do not press any options, hold on the line and a nurse will answer the phone.    For prescription refill requests, have your pharmacy contact our office and allow 72 hours.    Due to Covid, you will  need to wear a mask upon entering the hospital. If you do not have a mask, a mask will be given to you at the Main Entrance upon arrival. For doctor visits, patients may have 1 support person age 1 or older with them. For treatment visits, patients can not have anyone with them due to social distancing guidelines and our immunocompromised population.

## 2022-05-14 ENCOUNTER — Other Ambulatory Visit: Payer: Self-pay

## 2022-05-14 ENCOUNTER — Ambulatory Visit (HOSPITAL_BASED_OUTPATIENT_CLINIC_OR_DEPARTMENT_OTHER): Payer: No Typology Code available for payment source

## 2022-05-14 ENCOUNTER — Encounter (HOSPITAL_COMMUNITY)
Admission: RE | Disposition: A | Discharge status: Home / Self Care | Payer: Self-pay | Source: Home / Self Care | Attending: Vascular Surgery

## 2022-05-14 ENCOUNTER — Ambulatory Visit (HOSPITAL_COMMUNITY)
Admission: RE | Admit: 2022-05-14 | Discharge: 2022-05-14 | Disposition: A | Discharge status: Home / Self Care | Payer: No Typology Code available for payment source | Attending: Vascular Surgery | Admitting: Vascular Surgery

## 2022-05-14 ENCOUNTER — Encounter (HOSPITAL_COMMUNITY): Payer: Self-pay | Admitting: Vascular Surgery

## 2022-05-14 DIAGNOSIS — I2510 Atherosclerotic heart disease of native coronary artery without angina pectoris: Secondary | ICD-10-CM | POA: Insufficient documentation

## 2022-05-14 DIAGNOSIS — Z951 Presence of aortocoronary bypass graft: Secondary | ICD-10-CM | POA: Diagnosis not present

## 2022-05-14 DIAGNOSIS — I724 Aneurysm of artery of lower extremity: Secondary | ICD-10-CM | POA: Diagnosis not present

## 2022-05-14 DIAGNOSIS — Z8572 Personal history of non-Hodgkin lymphomas: Secondary | ICD-10-CM | POA: Diagnosis not present

## 2022-05-14 DIAGNOSIS — I10 Essential (primary) hypertension: Secondary | ICD-10-CM | POA: Diagnosis not present

## 2022-05-14 DIAGNOSIS — Z87891 Personal history of nicotine dependence: Secondary | ICD-10-CM | POA: Diagnosis not present

## 2022-05-14 DIAGNOSIS — Z01810 Encounter for preprocedural cardiovascular examination: Secondary | ICD-10-CM | POA: Diagnosis not present

## 2022-05-14 DIAGNOSIS — E785 Hyperlipidemia, unspecified: Secondary | ICD-10-CM | POA: Diagnosis not present

## 2022-05-14 HISTORY — PX: LOWER EXTREMITY ANGIOGRAPHY: CATH118251

## 2022-05-14 LAB — POCT I-STAT, CHEM 8
BUN: 25 mg/dL — ABNORMAL HIGH (ref 8–23)
Calcium, Ion: 1.26 mmol/L (ref 1.15–1.40)
Chloride: 101 mmol/L (ref 98–111)
Creatinine, Ser: 1.1 mg/dL (ref 0.61–1.24)
Glucose, Bld: 115 mg/dL — ABNORMAL HIGH (ref 70–99)
HCT: 38 % — ABNORMAL LOW (ref 39.0–52.0)
Hemoglobin: 12.9 g/dL — ABNORMAL LOW (ref 13.0–17.0)
Potassium: 3.9 mmol/L (ref 3.5–5.1)
Sodium: 139 mmol/L (ref 135–145)
TCO2: 25 mmol/L (ref 22–32)

## 2022-05-14 LAB — PROTIME-INR
INR: 1.2 (ref 0.8–1.2)
Prothrombin Time: 15.4 seconds — ABNORMAL HIGH (ref 11.4–15.2)

## 2022-05-14 SURGERY — LOWER EXTREMITY ANGIOGRAPHY
Anesthesia: LOCAL | Laterality: Right

## 2022-05-14 MED ORDER — HEPARIN (PORCINE) IN NACL 1000-0.9 UT/500ML-% IV SOLN
INTRAVENOUS | Status: AC
  Filled 2022-05-14: qty 500

## 2022-05-14 MED ORDER — SODIUM CHLORIDE 0.9% FLUSH: 3.0000 mL | INTRAVENOUS | Status: DC | PRN

## 2022-05-14 MED ORDER — SODIUM CHLORIDE 0.9 % IV SOLN: 250.0000 mL | INTRAVENOUS | Status: DC | PRN

## 2022-05-14 MED ORDER — SODIUM CHLORIDE 0.9 % IV SOLN: INTRAVENOUS | Status: DC

## 2022-05-14 MED ORDER — HEPARIN SOD (PORK) LOCK FLUSH 100 UNIT/ML IV SOLN
INTRAVENOUS | Status: AC
  Filled 2022-05-14: qty 5

## 2022-05-14 MED ORDER — ONDANSETRON HCL 4 MG/2ML IJ SOLN: 4.0000 mg | Freq: Four times a day (QID) | INTRAMUSCULAR | Status: DC | PRN

## 2022-05-14 MED ORDER — HEPARIN (PORCINE) IN NACL 1000-0.9 UT/500ML-% IV SOLN
INTRAVENOUS | Status: DC | PRN
  Administered 2022-05-14 (×2): 500 mL

## 2022-05-14 MED ORDER — LIDOCAINE HCL (PF) 1 % IJ SOLN
INTRAMUSCULAR | Status: DC | PRN
  Administered 2022-05-14: 15 mL

## 2022-05-14 MED ORDER — LIDOCAINE HCL (PF) 1 % IJ SOLN
INTRAMUSCULAR | Status: AC
  Filled 2022-05-14: qty 30

## 2022-05-14 MED ORDER — MIDAZOLAM HCL 2 MG/2ML IJ SOLN
INTRAMUSCULAR | Status: AC
  Filled 2022-05-14: qty 2

## 2022-05-14 MED ORDER — IODIXANOL 320 MG/ML IV SOLN
INTRAVENOUS | Status: DC | PRN
  Administered 2022-05-14: 80 mL via INTRA_ARTERIAL

## 2022-05-14 MED ORDER — FENTANYL CITRATE (PF) 100 MCG/2ML IJ SOLN
INTRAMUSCULAR | Status: DC | PRN
  Administered 2022-05-14: 50 ug via INTRAVENOUS

## 2022-05-14 MED ORDER — HEPARIN SOD (PORK) LOCK FLUSH 100 UNIT/ML IV SOLN
500.0000 [IU] | Freq: Once | INTRAVENOUS | Status: AC
  Administered 2022-05-14: 500 [IU] via INTRAVENOUS

## 2022-05-14 MED ORDER — LABETALOL HCL 5 MG/ML IV SOLN: 10.0000 mg | INTRAVENOUS | Status: DC | PRN

## 2022-05-14 MED ORDER — FENTANYL CITRATE (PF) 100 MCG/2ML IJ SOLN
INTRAMUSCULAR | Status: AC
  Filled 2022-05-14: qty 2

## 2022-05-14 MED ORDER — HYDRALAZINE HCL 20 MG/ML IJ SOLN: 5.0000 mg | INTRAMUSCULAR | Status: DC | PRN

## 2022-05-14 MED ORDER — MIDAZOLAM HCL 2 MG/2ML IJ SOLN
INTRAMUSCULAR | Status: DC | PRN
  Administered 2022-05-14: 1 mg via INTRAVENOUS

## 2022-05-14 MED ORDER — ACETAMINOPHEN 325 MG PO TABS: 650.0000 mg | ORAL_TABLET | ORAL | Status: DC | PRN

## 2022-05-14 MED ORDER — SODIUM CHLORIDE 0.9% FLUSH: 3.0000 mL | Freq: Two times a day (BID) | INTRAVENOUS | Status: DC

## 2022-05-14 SURGICAL SUPPLY — 14 items
CATH OMNI FLUSH 5F 65CM (CATHETERS) ×1 IMPLANT
CATH TEMPO AQUA 5F 100CM (CATHETERS) ×1 IMPLANT
CLOSURE MYNX CONTROL 5F (Vascular Products) IMPLANT
DEVICE CLOSURE MYNXGRIP 5F (Vascular Products) ×1 IMPLANT
DEVICE TORQUE .025-.038 (MISCELLANEOUS) ×1 IMPLANT
GUIDEWIRE ANGLED .035X150CM (WIRE) ×1 IMPLANT
KIT MICROPUNCTURE NIT STIFF (SHEATH) ×1 IMPLANT
KIT PV (KITS) ×2 IMPLANT
SHEATH PINNACLE 5F 10CM (SHEATH) ×1 IMPLANT
SHEATH PROBE COVER 6X72 (BAG) ×1 IMPLANT
SYR MEDRAD MARK V 150ML (SYRINGE) ×1 IMPLANT
TRANSDUCER W/STOPCOCK (MISCELLANEOUS) ×2 IMPLANT
TRAY PV CATH (CUSTOM PROCEDURE TRAY) ×2 IMPLANT
WIRE BENTSON .035X145CM (WIRE) ×1 IMPLANT

## 2022-05-14 NOTE — H&P (Signed)
History and Physical Interval Note:  05/14/2022 7:41 AM  [name] [name]  has presented today for surgery, with the diagnosis of right popliteal aneurysm.  The various methods of treatment have been discussed with the patient and family. After consideration of risks, benefits and other options for treatment, the patient has consented to  Procedure(s): ABDOMINAL AORTOGRAM W/LOWER EXTREMITY (N/A) as a surgical intervention.  The patient's history has been reviewed, patient examined, no change in status, stable for surgery.  I have reviewed the patient's chart and labs.  Questions were answered to the patient's satisfaction.     [name] [name]  Patient name: [name] [name]        MRN: [id number]        DOB: Apr 28, 1946          Sex: male   REASON FOR CONSULT: Follow-up to discuss repair of right popliteal artery aneurysm   HPI: [name] is a 76 y.o. male, with history of lymphoma undergoing chemotherapy, hypertension, hyperlipidemia, coronary artery disease status post CABG that presents for follow-up to discuss right popliteal artery aneurysm.  He was initially seen by Dr. Donnetta Hutching in [place].  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  He was undergoing chemotherapy for lymphoma and Dr. Donnetta Hutching recommended he follow-up with me in 1 to 2 months.  Patient states he has now completed his chemotherapy approximately 3 weeks ago.  He is due for a PET scan on 9/21.  Complains of swelling in his right leg.       Past Medical History:  Diagnosis Date   ALLERGIC RHINITIS 10/31/2007    Qualifier: Diagnosis of  By: Jenny Reichmann MD, [place]    Allergy     CHOLECYSTECTOMY, HX OF 06/04/2007    Qualifier: Diagnosis of  By: Danny Lawless CMA, [place]     COLONIC POLYPS, HX OF 10/31/2007    Qualifier: Diagnosis of  By: Jenny Reichmann MD, [place] GRAFT, HX OF 06/04/2007    Qualifier: Diagnosis of  By: [place],  [place]     CORONARY ARTERY DISEASE 06/04/2007    Qualifier: Diagnosis of  By: Danny Lawless CMA, [place]     Diverticulosis     Erectile dysfunction     Eye twitch      right eye since chilhood    HYPERLIPIDEMIA 06/04/2007    Qualifier: Diagnosis of  By: [place], [place]     Hypersomnolence     HYPERTENSION 06/04/2007    Qualifier: Diagnosis of  By: [place], [place]     Hypothyroidism     ISCHEMIC CARDIOMYOPATHY 06/04/2007    Qualifier: Diagnosis of  By: Danny Lawless CMA, [place]     Myocardial infarction ([place])      per patient , his occurred in 1998    Osteoarthritis     PLMD (periodic limb movement disorder)     Sleep apnea      no cpap' per patient , "i was checked for it and they said i didnt have it "    Vertigo     Vitamin D deficiency             Past Surgical History:  Procedure Laterality Date   BIOPSY   11/25/2021    Procedure: BIOPSY;  Surgeon: Jackquline Denmark, MD;  Location: [facility];  Service: Gastroenterology;;   CHOLECYSTECTOMY       COLONOSCOPY  COLONOSCOPY WITH PROPOFOL N/A 11/25/2021    Procedure: COLONOSCOPY WITH PROPOFOL;  Surgeon: Jackquline Denmark, MD;  Location: [facility];  Service: Gastroenterology;  Laterality: N/A;   CORONARY ARTERY BYPASS GRAFT       ESOPHAGOGASTRODUODENOSCOPY (EGD) WITH PROPOFOL N/A 11/25/2021    Procedure: ESOPHAGOGASTRODUODENOSCOPY (EGD) WITH PROPOFOL;  Surgeon: Jackquline Denmark, MD;  Location: [facility];  Service: Gastroenterology;  Laterality: N/A;   ICD IMPLANT N/A 01/30/2020    Procedure: ICD IMPLANT;  Surgeon: Thompson Grayer, MD;  Location: [facility];  Service: Cardiovascular;  Laterality: N/A;   IR IMAGING GUIDED PORT INSERTION   12/04/2021   KNEE ARTHROPLASTY Left 08/05/2017    Procedure: LEFT TOTAL KNEE ARTHROPLASTY WITH COMPUTER NAVIGATION;  Surgeon: Rod Can, MD;  Location: [facility];  Service: Orthopedics;  Laterality: Left;  Needs RNFA   PENILE PROSTHESIS IMPLANT       POLYPECTOMY   11/25/2021    Procedure:  POLYPECTOMY;  Surgeon: Jackquline Denmark, MD;  Location: [facility];  Service: Gastroenterology;;   REPLACEMENT TOTAL KNEE   06/12/2013   spinal cyst       THORACOTOMY        left anterior; wound exploration and debridement   TONSILLECTOMY AND ADENOIDECTOMY        age 48           Family History  Problem Relation Age of Onset   Stomach cancer Mother          smokes   Lung cancer Father          chewed tobacco   Heart disease Brother          first MI at 51yo, now 35 for transplant list/ [facility]   Pancreatic cancer Brother     Colon cancer Maternal Grandfather     COPD Son          was a smoker   Pancreatic cancer Maternal Uncle     Pancreatic cancer Cousin          mat side x 4   Esophageal cancer Neg Hx     Prostate cancer Neg Hx     Rectal cancer Neg Hx        SOCIAL HISTORY: Social History         Socioeconomic History   Marital status: Married      Spouse name: [name]   Number of children: 3   Years of education: Not on file   Highest education level: Not on file  Occupational History   Occupation: truck Archivist: [name]  Tobacco Use   Smoking status: Former      Packs/day: 4.00      Years: 17.00      Total pack years: 68.00      Types: Cigarettes      Quit date: 05/07/1981      Years since quitting: 40.9      Passive exposure: Never   Smokeless tobacco: Never   Tobacco comments:      Pt states that he would let most of them "burn" pt states that he used anywhere between 4-5PPD  Vaping Use   Vaping Use: Never used  Substance and Sexual Activity   Alcohol use: Yes      Alcohol/week: 0.0 standard drinks of alcohol      Comment: rare   Drug use: No   Sexual activity: Not on file  Other Topics Concern   Not on file  Social  History Narrative    Lives in [place] with spouse    Retired Administrator    Social Determinants of Health        Financial Resource Strain: [place]  (10/27/2021)    Overall Financial Resource Strain (CARDIA)      Difficulty of Paying Living Expenses: Not hard at all  Food Insecurity: No Food Insecurity (10/27/2021)    Hunger Vital Sign     Worried About Running Out of Food in the Last Year: Never true     Ran Out of Food in the Last Year: Never true  Transportation Needs: No Transportation Needs (10/27/2021)    PRAPARE - Armed forces logistics/support/administrative officer (Medical): No     Lack of Transportation (Non-Medical): No  Physical Activity: Inactive (10/27/2021)    Exercise Vital Sign     Days of Exercise per Week: 0 days     Minutes of Exercise per Session: 0 min  Stress: No Stress Concern Present (10/27/2021)    [place]     Feeling of Stress : Not at all  Social Connections: Moderately Isolated (10/27/2021)    Social Connection and Isolation Panel [NHANES]     Frequency of Communication with Friends and Family: More than three times a week     Frequency of Social Gatherings with Friends and Family: More than three times a week     Attends Religious Services: Never     Marine scientist or Organizations: No     Attends Archivist Meetings: Never     Marital Status: Married  Human resources officer Violence: Not At Risk (10/27/2021)    Humiliation, Afraid, Rape, and Kick questionnaire     Fear of Current or Ex-Partner: No     Emotionally Abused: No     Physically Abused: No     Sexually Abused: No           Allergies  Allergen Reactions   Ace Inhibitors Cough   Statins Other (See Comments)      Muscle pains            Current Outpatient Medications  Medication Sig Dispense Refill   ARTIFICIAL TEAR SOLUTION OP Place 1 drop into both eyes daily as needed (dry eyes).       Ascorbic Acid (VITA-C PO) Take 1,000 mg by mouth daily.       b complex vitamins capsule Take 1 capsule by mouth daily.       empagliflozin (JARDIANCE) 10 MG TABS tablet Take 10 mg by mouth daily.       ezetimibe (ZETIA) 10 MG tablet TAKE  1 TABLET EVERY DAY (Patient taking differently: Take by mouth every other day.) 90 tablet 3   feeding supplement (ENSURE ENLIVE / ENSURE PLUS) LIQD Take 237 mLs by mouth 3 (three) times daily between meals. 237 mL 12   furosemide (LASIX) 20 MG tablet Take 1 tablet (20 mg total) by mouth daily. 90 tablet 3   lidocaine-prilocaine (EMLA) cream Apply a small amount to port a cath site (do not rub in) and cover with plastic wrap 1 hour prior to infusion appointments 30 g 3   metoprolol succinate (TOPROL-XL) 25 MG 24 hr tablet Take 1 tablet (25 mg total) by mouth daily. Take with or immediately following a meal. 30 tablet 6   Multiple Vitamins-Minerals (MULTIVITAMIN WITH MINERALS) tablet Take 1 tablet by mouth daily. Centrum silver  prochlorperazine (COMPAZINE) 10 MG tablet Take 1 tablet (10 mg total) by mouth every 6 (six) hours as needed for nausea or vomiting. 30 tablet 3   rosuvastatin (CRESTOR) 5 MG tablet TAKE 1 TABLET EVERY OTHER DAY 45 tablet 10   spironolactone (ALDACTONE) 25 MG tablet TAKE 1/2 TABLET EVERY DAY 45 tablet 3   triamcinolone (NASACORT) 55 MCG/ACT AERO nasal inhaler Place 2 sprays into the nose daily. (Patient taking differently: Place 2 sprays into the nose daily as needed (allergies).) 3 Inhaler 3   VITAMIN D PO Take 1,000 Units by mouth daily.       warfarin (COUMADIN) 5 MG tablet TAKE 1 TABLET EVERY DAY AT 4 PM (Patient taking differently: Take 2.5-5 mg by mouth See admin instructions. Taking 5 mg every day except on Friday taking 1/2 tablet = 2.5 mg) 90 tablet 4   allopurinol (ZYLOPRIM) 300 MG tablet Take 1 tablet (300 mg total) by mouth daily. (Patient not taking: Reported on 04/21/2022) 30 tablet 3   predniSONE (DELTASONE) 20 MG tablet Take 5 tablets (100 mg total) by mouth daily with breakfast. Take 100 mg by mouth daily w/food (Patient not taking: Reported on 04/21/2022) 15 tablet 0    No current facility-administered medications for this visit.      REVIEW OF SYSTEMS:   '[X]'$  denotes positive finding, '[ ]'$  denotes negative finding Cardiac   Comments:  Chest pain or chest pressure:      Shortness of breath upon exertion:      Short of breath when lying flat:      Irregular heart rhythm:             Vascular      Pain in calf, thigh, or hip brought on by ambulation:      Pain in feet at night that wakes you up from your sleep:       Blood clot in your veins:      Leg swelling:  x Right         Pulmonary      Oxygen at home:      Productive cough:       Wheezing:              Neurologic      Sudden weakness in arms or legs:       Sudden numbness in arms or legs:       Sudden onset of difficulty speaking or slurred speech:      Temporary loss of vision in one eye:       Problems with dizziness:              Gastrointestinal      Blood in stool:       Vomited blood:              Genitourinary      Burning when urinating:       Blood in urine:             Psychiatric      Major depression:              Hematologic      Bleeding problems:      Problems with blood clotting too easily:             Skin      Rashes or ulcers:             Constitutional      Fever or chills:  PHYSICAL EXAM:    Vitals:    04/21/22 1507  BP: 117/72  Pulse: (!) 55  Resp: 18  Temp: 97.9 F (36.6 C)  TempSrc: Temporal  SpO2: 98%  Weight: 182 lb (82.6 kg)  Height: '5\' 11"'$  (1.803 m)      GENERAL: The patient is a well-nourished male, in no acute distress. The vital signs are documented above. CARDIAC: There is a regular rate and rhythm.  VASCULAR:  Right femoral pulse palpable Prominent right popliteal pulse palpable Right DP palpable PULMONARY: No respiratory distress ABDOMEN: Soft and non-tender with normal pitched bowel sounds.  MUSCULOSKELETAL: There are no major deformities or cyanosis. NEUROLOGIC: No focal weakness or paresthesias are detected. SKIN: There are no ulcers or rashes noted. PSYCHIATRIC: The patient has a normal  affect.   DATA:    CTA reviewed from 03/03/2022 with 4.7 cm right popliteal artery aneurysm   Assessment/Plan:   76 year old male presents for follow-up to discuss repair of right popliteal artery aneurysm after initial evaluation by Dr. Donnetta Hutching in [place].  I reviewed his CT scan that confirms a 4.7 cm right popliteal artery aneurysm.  Unfortunately, I do not think he is a good candidate for stent graft repair given the anterior tibial takes off at the aneurysm and would require covering the anterior tibial and there is also a significant size mismatch between the SFA and the TP trunk where you would have to stent distally that can lead to significant endoleak's and not getting seal.  I have recommended an aortogram with lower extremity arteriogram to further evaluate his runoff and likely plan a bypass.  I will schedule this in several weeks once he gets his PET scan to ensure he does not need any additional chemotherapy for his lymphoma and will tentatively schedule for 9/28 .  This will also confirm that he is not a stent graft candidate.  Discussed that arteriogram will evaluate dominant runoff to the we can plan bypass with exclusion of the aneurysm.  We will schedule surgery after we evaluate his arteriogram images.  Discussed that his popliteal aneurysm at this size will put [name] at risk of thrombosis and embolization that can lead to limb loss.     [name] Heck, MD [facility] Office: [phone]

## 2022-05-14 NOTE — Op Note (Signed)
    Patient name: [name] [name] MRN: [id number] DOB: Apr 12, 1946 Sex: male  05/14/2022 Pre-operative Diagnosis: 4.7 cm right popliteal artery aneurysm Post-operative diagnosis:  Same Surgeon:  [name], MD Procedure Performed: 1.  Ultrasound-guided access left common femoral artery 2.  Right lower extremity arteriogram with selection of third order branches 3.  Mynx closure of the left common femoral artery 4.  37 minutes of monitored moderate conscious sedation time  Indications: Patient is a 76 year old male with a 4.7 cm right popliteal artery aneurysm evaluated by Dr. Donnetta Hutching in [place].  He was sent here for follow-up and presents today for angiogram with a focus on the right leg after risks benefits discussed.  I previously discussed I do not think he is a good candidate for stent graft due to significant size mismatch between the proximal and distal seal zones.  Findings:   Aortogram was deferred given recent CTA.  Right lower extremity arteriogram shows a widely patent common femoral, profunda, and SFA with a large popliteal aneurysm behind the knee.  The aneurysm extends down to the takeoff of the anterior tibial that appears and the anterior tibial has a short segment occlusion in the proximal calf and then reconstitutes and appears diseased.  Dominant runoff is through the TP trunk with the peroneal and posterior tibial artery.  The peroneal does occlude distally above the ankle as does the PT at the ankle with no in-line flow into the foot.   Procedure:  The patient was identified in the holding area and taken to room 8.  The patient was then placed supine on the table and prepped and draped in the usual sterile fashion.  A time out was called.  The patient received Versed and fentanyl for moderate sedation.  Vital signs were monitored including heart rate, respiratory rate, oxygenation and blood pressure.  I was present for all of moderate sedation.  Ultrasound was used  to evaluate the left common femoral artery.  It was patent .  A digital ultrasound image was acquired.  A micropuncture needle was used to access the left common femoral artery under ultrasound guidance.  An 018 wire was advanced without resistance and a micropuncture sheath was placed.  The 018 wire was removed and a benson wire was placed.  The micropuncture sheath was exchanged for a 5 french sheath.  Ultimately the Omni Flush catheter was passed over the aortic bifurcation into the right iliac artery.  Right lower extremity runoff was obtained.  Ultimately we then exchanged for a straight 100 cm flush catheter to get better images of the tibial runoff.  Again I did not feel he was a good candidate for stent graft.  Wires and catheters were removed.  A mynx closure device was deployed in the left common femoral artery.  Plan: Patient be scheduled for right lower extremity bypass with exclusion of popliteal aneurysm next week.  Vein mapping ordered today.  [name], MD [facility] Office: [place]

## 2022-05-14 NOTE — Progress Notes (Signed)
VASCULAR LAB    Vein mapping has been performed.  See CV proc for preliminary results.   Shamara Soza, RVT 05/14/2022, 11:08 AM

## 2022-05-18 ENCOUNTER — Encounter: Payer: Self-pay | Admitting: Cardiovascular Disease

## 2022-05-18 ENCOUNTER — Ambulatory Visit: Payer: Medicare HMO

## 2022-05-18 ENCOUNTER — Encounter (HOSPITAL_COMMUNITY): Payer: Self-pay | Admitting: Vascular Surgery

## 2022-05-18 ENCOUNTER — Other Ambulatory Visit: Payer: Self-pay

## 2022-05-18 NOTE — Progress Notes (Signed)
[facility] DEVICE PROGRAMMING  Patient Information: Name:  [name] [name]  DOB:  06/22/1946  MRN:  [id number]    Planned Procedure: Right  femoral to popliteal  Bypass                                                          Surgeon:  DR Carlis Abbott  Date of Procedure:  05/20/22  Cautery will be used.  Position during surgery:  supine   Please send documentation back to:  [facility] (Fax # [phone])   Device Information:  Clinic EP Physician:  Doralee Albino    Device Type:  Defibrillator Manufacturer and Phone #:  [employer]: [id number] Pacemaker Dependent?:  No. Date of Last Device Check:  05/12/22 Normal Device Function?:  Yes.    Electrophysiologist's Recommendations:  Have magnet available. Provide continuous ECG monitoring when magnet is used or reprogramming is to be performed.  Procedure should not interfere with device function.  No device programming or magnet placement needed.  Per Device Clinic Standing Orders, Simone Curia, RN  2:01 PM 05/18/2022

## 2022-05-18 NOTE — Progress Notes (Addendum)
[name] [name] denies chest pain or shortness of breath.Patient denies having any s/s of Covid in his household, also denies any known exposure to Covid.   [name] [name] takes [name] [name] for CHF, patient has taken it today, I instructed patient to not take it Tuesday or Wednesday. I sent [name] [name] an inbox message informing him that [name] [name] was not instructed to hold Jardiance, so he will not take it 48 hours instead of 72 prior and that the anesthesiologist could possibly cancel surgery.  I informed [name] [name] that patient 's [name] [name] will be held 48 hours instead of 72- no new orders. I instructed patient to not take Mobic or vitamins until he is instructed to.  [name] [name] is on 5 mg of Coumadin daily, Coumadin has been on hold approximately 2 weeks. [name] [name] came to the hospital for an aortogram  INR was too high to do procedure.

## 2022-05-19 ENCOUNTER — Encounter (HOSPITAL_COMMUNITY): Payer: Self-pay | Admitting: Vascular Surgery

## 2022-05-19 ENCOUNTER — Telehealth: Payer: Self-pay

## 2022-05-19 NOTE — Anesthesia Preprocedure Evaluation (Addendum)
Anesthesia Evaluation  Patient identified by MRN, date of birth, ID band Patient awake    Reviewed: Allergy & Precautions, NPO status , Patient's Chart, lab work & pertinent test results  History of Anesthesia Complications Negative for: history of anesthetic complications  Airway Mallampati: II  TM Distance: >3 FB Neck ROM: Full    Dental  (+) Dental Advisory Given, Partial Lower   Pulmonary sleep apnea , former smoker   Pulmonary exam normal        Cardiovascular hypertension, Pt. on medications and Pt. on home beta blockers + CAD, + Past MI, + CABG, + Peripheral Vascular Disease and +CHF  Normal cardiovascular exam+ Cardiac Defibrillator    '23 TTE - EF 30 to 35%. Regional wall motion abnormalities (see scoring diagram/findings for description). There is akinesis of the left ventricular, mid-apical anteroseptal wall, inferoseptal wall and apical  segment. Left atrial size was moderately dilated. Trivial mitral valve regurgitation. Aortic valve regurgitation is mild. Mild aortic valve stenosis.     Neuro/Psych  PLMD Vertigo   Neuromuscular disease  negative psych ROS   GI/Hepatic Neg liver ROS, hiatal hernia,,,  Endo/Other  Hypothyroidism    Renal/GU negative Renal ROS     Musculoskeletal  (+) Arthritis ,    Abdominal   Peds  Hematology  (+) Blood dyscrasia, anemia  On coumadin Non-Hodgkin's    Anesthesia Other Findings   Reproductive/Obstetrics                             Anesthesia Physical Anesthesia Plan  ASA: 4  Anesthesia Plan: General   Post-op Pain Management: Tylenol PO (pre-op)*   Induction: Intravenous  PONV Risk Score and Plan: 2 and Treatment may vary due to age or medical condition, Ondansetron and Propofol infusion  Airway Management Planned: Oral ETT  Additional Equipment: Arterial line  Intra-op Plan:   Post-operative Plan: Extubation in  OR  Informed Consent: I have reviewed the patients History and Physical, chart, labs and discussed the procedure including the risks, benefits and alternatives for the proposed anesthesia with the patient or authorized representative who has indicated his/her understanding and acceptance.     Dental advisory given  Plan Discussed with: CRNA and Anesthesiologist  Anesthesia Plan Comments:         Anesthesia Quick Evaluation

## 2022-05-19 NOTE — Telephone Encounter (Signed)
Patient called saying his surgery should be authorized through the [place], Not [employer].  I called patient back to advise him that we normally authorized All his insurances just in case on does not approve.  No answer.  I left a voicemail on his home number.  Auth for [place] is #[id number] (05/07/22 - 11/03/22).

## 2022-05-19 NOTE — Progress Notes (Signed)
Anesthesia Chart Review: [name] [name]  Case: [id number] Date/Time: 05/20/22 0815   Procedure: RIGHT FEMORAL-POPLITEAL BYPASS WITH EXCLUSION OF POPLITEAL ANEURYSM (Right)   Anesthesia type: General   Pre-op diagnosis: Popliteal artery aneurysm   Location: [facility] OR ROOM 12 / [facility]   Surgeons: [name], MD       DISCUSSION: Patient is a 76 year old male scheduled for the above procedure.  History includes former smoker (quit 05/07/81), CAD (MI 1998, s/p "minimally invasive LIMA to his LAD", ischemic cardiomyopathy (diagnosed > 15 years ago), chronic systolic CHF, LV apical thrombus (02/2019, started on warfarin), ICD (dual chamber Medtronic Evera MRI XT DR [id number] ICD 01/30/20), PVD (4.7 cm right popliteal artery aneurysm), HTN, HLD, non-Hodgkin's B-cell lymphoma (stage IVb diagnosed 12/03/21, s/p chemotherapy, completed 6 cycles 03/31/22), hypothyroidism, OSA, vertigo, hiatal hernia, osteoarthritis (left TKA 08/05/17).   Last cardiology visit was on 02/20/22 with Coletta Memos, NP. 01/12/22 echo showed LVEF 30-35%, LV akinesis in the mid-apical anteroseptal wall, inferoseptal wall and apical segment. Normal RVSF. Trivial MR, mild AR/AS.  Findings felt stable from prior study. Nuclear stress test 01/31/21 was consistent with prior MI in the LAD and RCA territories, no ischemia. Fatigue attributed to on-going chemotherapy. No chest pain. Medical therapy continued for CAD. 3-4 month follow-up planned.   Last EP office visit 12/16/21. EP ICD perioperative recommendations: Device Information: Clinic EP Physician:  Doralee Albino          Device Type:  Defibrillator Manufacturer and Phone #:  Medtronic: [phone] Pacemaker Dependent?:  No. Date of Last Device Check:  05/12/22           Normal Device Function?:  Yes.     Electrophysiologist's Recommendations:  Have magnet available. Provide continuous ECG monitoring when magnet is used or reprogramming is to be performed.  Procedure should  not interfere with device function.  No device programming or magnet placement needed.  Last oncology visit with Dr. Delton Coombes was on 05/12/22.  His energy levels were improving after chemotherapy cycles completed.  PET scan from 04/30/2022 showed complete response.  73-monthfollow-up planned.  He is a same day work-up. Recent cardiology and oncology visits summarized above. He denied chest pain and SOB per PAT RN interview. He reported last Jardiance 05/18/22. Warfarin has been on hold since prior to 05/14/22 arteriogram. Anesthesia team to evaluate on the day of surgery. Labs on arrival as indicated.   VS:  BP Readings from Last 3 Encounters:  05/14/22 106/63  05/12/22 120/77  05/07/22 116/73   Pulse Readings from Last 3 Encounters:  05/14/22 69  05/12/22 63  05/07/22 62     PROVIDERS: JBiagio Borg MD is PCP  CKirk Ruths MD is cardiologist AThompson Grayer MD was EP cardiologist. Last visit 12/16/21 with TBarrington Ellison PA-C.  Normal ICD function.  Appeared somewhat volume overloaded, so Lasix increased x 3 days and counseled on salt and fluid restriction.   KDerek Jack MD is HLeanna Sato MD is vascular surgeon. Last office visit 03/04/22. He had recommended delaying popliteal artery intervention until he had completed chemotherapy.  Vascular surgery follow-up with Dr. CCarlis Abbottto discuss future intervention.    LABS: Most recent lab results include: Lab Results  Component Value Date   WBC 4.4 05/12/2022   HGB 12.9 (L) 05/14/2022   HCT 38.0 (L) 05/14/2022   PLT 106 (L) 05/12/2022   GLUCOSE 115 (H) 05/14/2022   ALT 18 05/12/2022   AST 24 05/12/2022   NA 139 05/14/2022  K 3.9 05/14/2022   CL 101 05/14/2022   CREATININE 1.10 05/14/2022   BUN 25 (H) 05/14/2022   CO2 25 05/12/2022   TSH 2.95 10/09/2021   PSA 1.23 10/09/2021   INR 1.2 05/14/2022   HGBA1C 6.1 10/09/2021    IMAGES: PET Scan 04/30/22: IMPRESSION: 1. No evidence of recurrent lymphoma  ([place] 1). 2. Aortic atherosclerosis (ICD10-I70.0). Coronary artery calcification. 3.  Emphysema (ICD10-J43.9).   CTA Ao + BiFem 03/03/22: IMPRESSION: 1. 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 Three-vessel right tibial runoff, peroneal and posterior tibial runoff on the left. 3. Aortoiliac atherosclerosis (ICD10-170.0) without significant occlusive inflow disease 4. Subacute L3 compression fracture deformity. 5. Sigmoid diverticulosis   CTA Chest 01/19/22: IMPRESSION: - There is no evidence of pulmonary artery embolism. Coronary artery calcifications are seen. There is ectasia of ascending thoracic aorta measuring 3.8 cm. There is no evidence of thoracic aortic dissection. - There is no focal pulmonary consolidation. Small scattered foci of increased interstitial markings are seen in the left upper lobe and both lower lobes. This finding may suggest scarring or interstitial pneumonia.     EKG: 01/20/22: Sinus rhythm Multiple premature complexes, vent & supraven Left axis deviation Anterolateral infarct, age indeterminate Confirmed by Veryl Speak [phone]) on 01/20/2022 11:00:50 PM   CV: Echo 01/12/22: IMPRESSIONS   1. Left ventricular ejection fraction, by estimation, is 30 to 35%. The  left ventricle has moderately decreased function. The left ventricle  demonstrates regional wall motion abnormalities (see scoring  diagram/findings for description). Left ventricular   diastolic parameters are indeterminate. There is akinesis of the left  ventricular, mid-apical anteroseptal wall, inferoseptal wall and apical  segment.   2. Right ventricular systolic function is normal. The right ventricular  size is normal.   3. Left atrial size was moderately dilated.   4. The mitral valve is grossly normal. Trivial mitral valve  regurgitation. No evidence of mitral stenosis.   5. The aortic valve is tricuspid. There is moderate calcification of the  aortic valve. Aortic  valve regurgitation is mild. Mild aortic valve  stenosis.   6. The inferior vena cava is normal in size with <50% respiratory  variability, suggesting right atrial pressure of 8 mmHg.  - Comparison(s): No significant change from prior study.  - Conclusion(s)/Recommendation(s): No left ventricular mural or apical  thrombus/thrombi. Cardiomyopathy unchanged from prior, with wall motion  abnormalities as noted.    Nuclear stress test 01/31/21: Nuclear stress EF: 29%. There was no ST segment deviation noted during stress. Defect 1: There is a medium defect of severe severity present in the mid anterior, mid anteroseptal, apical anterior, apical septal and apex location. Defect 2: There is a medium defect of moderate severity present in the mid inferior, mid inferolateral, apical inferior and apical lateral location. Findings consistent with prior myocardial infarction in the LAD and RCA territories. The left ventricular ejection fraction is severely decreased (<30%). - Reviewed by Dr. [name], "No ischemia; continue medical therapy".   MRI Cardiac 02/15/19: IMPRESSION: 1. Left ventricular apical thrombus present, approximately 7 x 7 mm. 2. Severely reduced left ventricular systolic function, arrhythmia limits calculation of ejection fraction. Visual estimate suggests EF 30%. 3. Global hypokinesis apical and mid-apical anterior and anteroseptal akinesis, scar, thinning and early aneurysm formation. 4. Transmural delayed enhancement in the anterior and anteroseptal walls from mid ventricle to apex, and transmural delayed enhancement in the apex. Transmural delayed enhancement suggests infarction without viability in this area of the myocardium. 5.  Mildly  reduced right ventricular systolic function.    Past Medical History:  Diagnosis Date   AICD (automatic cardioverter/defibrillator) present    ALLERGIC RHINITIS 10/31/2007   Qualifier: Diagnosis of  By: Jenny Reichmann MD, [place]    Allergy     Cancer [facility]) 12/03/2021   non-Hodgkin's B-cell lymphoma   CHF (congestive heart failure) ([place])    CHOLECYSTECTOMY, HX OF 06/04/2007   Qualifier: Diagnosis of  By: [place], [place]     COLONIC POLYPS, HX OF 10/31/2007   Qualifier: Diagnosis of  By: Jenny Reichmann MD, [place], HX OF 06/04/2007   Qualifier: Diagnosis of  By: [place], [place]     CORONARY ARTERY DISEASE 06/04/2007   Qualifier: Diagnosis of  By: Danny Lawless CMA, [place]     Diverticulosis    Erectile dysfunction    Eye twitch    right eye since chilhood    History of hiatal hernia    HYPERLIPIDEMIA 06/04/2007   Qualifier: Diagnosis of  By: [place], [place]     Hypersomnolence    HYPERTENSION 06/04/2007   Qualifier: Diagnosis of  By: [place], [place]     Hypothyroidism    ISCHEMIC CARDIOMYOPATHY 06/04/2007   Qualifier: Diagnosis of  By: Danny Lawless CMA, [place]     LV (left ventricular) mural thrombus 02/15/2019   Myocardial infarction [facility])    per patient , his occurred in 1998    Osteoarthritis    Peripheral vascular disease ([place])    PLMD (periodic limb movement disorder)    Sleep apnea    no cpap' per patient , "i was checked for it and they said i didnt have it "    Vertigo    Vitamin D deficiency     Past Surgical History:  Procedure Laterality Date   BIOPSY  11/25/2021   Procedure: BIOPSY;  Surgeon: Jackquline Denmark, MD;  Location: [facility];  Service: Gastroenterology;;   CABG  1999   was done at the [facility] in [place], [place].  Patient reports that a small vein was taken from under his arm and a small incesion was made.   CHOLECYSTECTOMY     COLONOSCOPY     COLONOSCOPY WITH PROPOFOL N/A 11/25/2021   Procedure: COLONOSCOPY WITH PROPOFOL;  Surgeon: Jackquline Denmark, MD;  Location: [facility];  Service: Gastroenterology;  Laterality: N/A;   ESOPHAGOGASTRODUODENOSCOPY (EGD) WITH PROPOFOL N/A 11/25/2021   Procedure: ESOPHAGOGASTRODUODENOSCOPY (EGD) WITH PROPOFOL;   Surgeon: Jackquline Denmark, MD;  Location: [facility];  Service: Gastroenterology;  Laterality: N/A;   ICD IMPLANT N/A 01/30/2020   Procedure: ICD IMPLANT;  Surgeon: Thompson Grayer, MD;  Location: [place] [facility] LAB;  Service: Cardiovascular;  Laterality: N/A;   IR IMAGING GUIDED PORT INSERTION  12/04/2021   KNEE ARTHROPLASTY Left 08/05/2017   Procedure: LEFT TOTAL KNEE ARTHROPLASTY WITH COMPUTER NAVIGATION;  Surgeon: Rod Can, MD;  Location: [facility];  Service: Orthopedics;  Laterality: Left;  Needs RNFA   LOWER EXTREMITY ANGIOGRAPHY Right 05/14/2022   Procedure: Lower Extremity Angiography;  Surgeon: [name], MD;  Location: [place] CV LAB;  Service: Cardiovascular;  Laterality: Right;   PENILE PROSTHESIS IMPLANT     POLYPECTOMY  11/25/2021   Procedure: POLYPECTOMY;  Surgeon: Jackquline Denmark, MD;  Location: [facility];  Service: Gastroenterology;;   REPLACEMENT TOTAL KNEE  06/12/2013   spinal cyst     THORACOTOMY     left anterior; wound exploration and debridement   TONSILLECTOMY AND ADENOIDECTOMY  age 78    MEDICATIONS: No current facility-administered medications for this encounter.    ARTIFICIAL TEAR SOLUTION OP   Ascorbic Acid (VITA-C PO)   b complex vitamins capsule   empagliflozin (JARDIANCE) 10 MG TABS tablet   ezetimibe (ZETIA) 10 MG tablet   feeding supplement (ENSURE ENLIVE / ENSURE PLUS) LIQD   furosemide (LASIX) 20 MG tablet   lidocaine-prilocaine (EMLA) cream   meloxicam (MOBIC) 15 MG tablet   metoprolol succinate (TOPROL-XL) 50 MG 24 hr tablet   Multiple Vitamins-Minerals (MULTIVITAMIN WITH MINERALS) tablet   predniSONE (DELTASONE) 20 MG tablet   rosuvastatin (CRESTOR) 5 MG tablet   spironolactone (ALDACTONE) 25 MG tablet   triamcinolone (NASACORT) 55 MCG/ACT AERO nasal inhaler   warfarin (COUMADIN) 5 MG tablet   Prednisone was prescribed for when he was on chemotherapy.    Myra Gianotti, PA-C Surgical Short Stay/Anesthesiology [facility]  Phone [phone] [facility] Phone [phone] 05/19/2022 12:10 PM

## 2022-05-20 ENCOUNTER — Encounter (HOSPITAL_COMMUNITY)
Admission: RE | Disposition: A | Discharge status: Home / Self Care | Payer: Self-pay | Source: Home / Self Care | Attending: Vascular Surgery

## 2022-05-20 ENCOUNTER — Ambulatory Visit: Payer: Medicare HMO | Admitting: General Practice

## 2022-05-20 ENCOUNTER — Other Ambulatory Visit: Payer: Self-pay

## 2022-05-20 ENCOUNTER — Inpatient Hospital Stay (HOSPITAL_COMMUNITY)
Admission: RE | Admit: 2022-05-20 | Discharge: 2022-05-22 | DRG: 253 | Disposition: A | Discharge status: Home / Self Care | Payer: No Typology Code available for payment source | Attending: Vascular Surgery | Admitting: Vascular Surgery

## 2022-05-20 ENCOUNTER — Inpatient Hospital Stay (HOSPITAL_COMMUNITY): Payer: No Typology Code available for payment source | Admitting: Vascular Surgery

## 2022-05-20 ENCOUNTER — Encounter (HOSPITAL_COMMUNITY): Payer: Self-pay | Admitting: Vascular Surgery

## 2022-05-20 DIAGNOSIS — Z7952 Long term (current) use of systemic steroids: Secondary | ICD-10-CM

## 2022-05-20 DIAGNOSIS — Z951 Presence of aortocoronary bypass graft: Secondary | ICD-10-CM

## 2022-05-20 DIAGNOSIS — Z95810 Presence of automatic (implantable) cardiac defibrillator: Secondary | ICD-10-CM

## 2022-05-20 DIAGNOSIS — I2510 Atherosclerotic heart disease of native coronary artery without angina pectoris: Secondary | ICD-10-CM | POA: Diagnosis present

## 2022-05-20 DIAGNOSIS — Z96652 Presence of left artificial knee joint: Secondary | ICD-10-CM | POA: Diagnosis present

## 2022-05-20 DIAGNOSIS — I252 Old myocardial infarction: Secondary | ICD-10-CM

## 2022-05-20 DIAGNOSIS — I739 Peripheral vascular disease, unspecified: Secondary | ICD-10-CM | POA: Diagnosis present

## 2022-05-20 DIAGNOSIS — E785 Hyperlipidemia, unspecified: Secondary | ICD-10-CM | POA: Diagnosis not present

## 2022-05-20 DIAGNOSIS — I083 Combined rheumatic disorders of mitral, aortic and tricuspid valves: Secondary | ICD-10-CM | POA: Diagnosis not present

## 2022-05-20 DIAGNOSIS — Z825 Family history of asthma and other chronic lower respiratory diseases: Secondary | ICD-10-CM | POA: Diagnosis not present

## 2022-05-20 DIAGNOSIS — Z87891 Personal history of nicotine dependence: Secondary | ICD-10-CM | POA: Diagnosis not present

## 2022-05-20 DIAGNOSIS — E039 Hypothyroidism, unspecified: Secondary | ICD-10-CM | POA: Diagnosis present

## 2022-05-20 DIAGNOSIS — Z801 Family history of malignant neoplasm of trachea, bronchus and lung: Secondary | ICD-10-CM

## 2022-05-20 DIAGNOSIS — I10 Essential (primary) hypertension: Secondary | ICD-10-CM | POA: Diagnosis present

## 2022-05-20 DIAGNOSIS — G4733 Obstructive sleep apnea (adult) (pediatric): Secondary | ICD-10-CM | POA: Diagnosis present

## 2022-05-20 DIAGNOSIS — Z8249 Family history of ischemic heart disease and other diseases of the circulatory system: Secondary | ICD-10-CM | POA: Diagnosis not present

## 2022-05-20 DIAGNOSIS — Z800 Family history of malignant neoplasm of digestive organs: Secondary | ICD-10-CM

## 2022-05-20 DIAGNOSIS — E559 Vitamin D deficiency, unspecified: Secondary | ICD-10-CM | POA: Diagnosis present

## 2022-05-20 DIAGNOSIS — Z7901 Long term (current) use of anticoagulants: Secondary | ICD-10-CM | POA: Diagnosis not present

## 2022-05-20 DIAGNOSIS — I509 Heart failure, unspecified: Secondary | ICD-10-CM | POA: Diagnosis not present

## 2022-05-20 DIAGNOSIS — I724 Aneurysm of artery of lower extremity: Secondary | ICD-10-CM

## 2022-05-20 DIAGNOSIS — K449 Diaphragmatic hernia without obstruction or gangrene: Secondary | ICD-10-CM | POA: Diagnosis present

## 2022-05-20 DIAGNOSIS — I110 Hypertensive heart disease with heart failure: Secondary | ICD-10-CM | POA: Diagnosis not present

## 2022-05-20 DIAGNOSIS — D62 Acute posthemorrhagic anemia: Secondary | ICD-10-CM | POA: Diagnosis not present

## 2022-05-20 DIAGNOSIS — Z9221 Personal history of antineoplastic chemotherapy: Secondary | ICD-10-CM

## 2022-05-20 DIAGNOSIS — Z888 Allergy status to other drugs, medicaments and biological substances status: Secondary | ICD-10-CM | POA: Diagnosis not present

## 2022-05-20 DIAGNOSIS — D638 Anemia in other chronic diseases classified elsewhere: Secondary | ICD-10-CM | POA: Diagnosis not present

## 2022-05-20 DIAGNOSIS — Z8572 Personal history of non-Hodgkin lymphomas: Secondary | ICD-10-CM | POA: Diagnosis not present

## 2022-05-20 HISTORY — DX: Personal history of other diseases of the digestive system: Z87.19

## 2022-05-20 HISTORY — DX: Heart failure, unspecified: I50.9

## 2022-05-20 HISTORY — PX: FEMORAL-POPLITEAL BYPASS GRAFT: SHX937

## 2022-05-20 HISTORY — DX: Peripheral vascular disease, unspecified: I73.9

## 2022-05-20 HISTORY — PX: LIGATION OF CILIAC ARTERY: SHX6662

## 2022-05-20 HISTORY — DX: Presence of automatic (implantable) cardiac defibrillator: Z95.810

## 2022-05-20 HISTORY — PX: VEIN HARVEST: SHX6363

## 2022-05-20 HISTORY — PX: ENDOVASCULAR REPAIR OF POPLITEAL ARTERY ANEURYSM: SHX5811

## 2022-05-20 HISTORY — DX: Malignant (primary) neoplasm, unspecified: C80.1

## 2022-05-20 LAB — COMPREHENSIVE METABOLIC PANEL
ALT: 17 U/L (ref 0–44)
AST: 21 U/L (ref 15–41)
Albumin: 3.7 g/dL (ref 3.5–5.0)
Alkaline Phosphatase: 46 U/L (ref 38–126)
Anion gap: 9 (ref 5–15)
BUN: 21 mg/dL (ref 8–23)
CO2: 28 mmol/L (ref 22–32)
Calcium: 9.2 mg/dL (ref 8.9–10.3)
Chloride: 102 mmol/L (ref 98–111)
Creatinine, Ser: 1.15 mg/dL (ref 0.61–1.24)
GFR, Estimated: >60 mL/min (ref >60)
Glucose, Bld: 117 mg/dL — ABNORMAL HIGH (ref 70–99)
Potassium: 4 mmol/L (ref 3.5–5.1)
Sodium: 139 mmol/L (ref 135–145)
Total Bilirubin: 0.9 mg/dL (ref 0.3–1.2)
Total Protein: 6.1 g/dL — ABNORMAL LOW (ref 6.5–8.1)

## 2022-05-20 LAB — POCT I-STAT 7, (LYTES, BLD GAS, ICA,H+H)
Acid-base deficit: 2 mmol/L (ref 0.0–2.0)
Bicarbonate: 24.6 mmol/L (ref 20.0–28.0)
Calcium, Ion: 1.22 mmol/L (ref 1.15–1.40)
HCT: 31 % — ABNORMAL LOW (ref 39.0–52.0)
Hemoglobin: 10.5 g/dL — ABNORMAL LOW (ref 13.0–17.0)
O2 Saturation: 98 %
Patient temperature: 35.2
Potassium: 4.2 mmol/L (ref 3.5–5.1)
Sodium: 137 mmol/L (ref 135–145)
TCO2: 26 mmol/L (ref 22–32)
pCO2 arterial: 42.9 mmHg (ref 32–48)
pH, Arterial: 7.358 (ref 7.35–7.45)
pO2, Arterial: 97 mmHg (ref 83–108)

## 2022-05-20 LAB — PROTIME-INR
INR: 1.1 (ref 0.8–1.2)
Prothrombin Time: 14.4 seconds (ref 11.4–15.2)

## 2022-05-20 LAB — CBC
HCT: 40.8 % (ref 39.0–52.0)
Hemoglobin: 13.4 g/dL (ref 13.0–17.0)
MCH: 30.9 pg (ref 26.0–34.0)
MCHC: 32.8 g/dL (ref 30.0–36.0)
MCV: 94.2 fL (ref 80.0–100.0)
Platelets: 109 10*3/uL — ABNORMAL LOW (ref 150–400)
RBC: 4.33 MIL/uL (ref 4.22–5.81)
RDW: 14.5 % (ref 11.5–15.5)
WBC: 6 10*3/uL (ref 4.0–10.5)
nRBC: 0 % (ref 0.0–0.2)

## 2022-05-20 LAB — TYPE AND SCREEN
ABO/RH(D): O POS
Antibody Screen: NEGATIVE

## 2022-05-20 LAB — URINALYSIS, ROUTINE W REFLEX MICROSCOPIC
Bilirubin Urine: NEGATIVE
Glucose, UA: NEGATIVE mg/dL
Hgb urine dipstick: NEGATIVE
Ketones, ur: NEGATIVE mg/dL
Leukocytes,Ua: NEGATIVE
Nitrite: NEGATIVE
Protein, ur: NEGATIVE mg/dL
Specific Gravity, Urine: 1.011 (ref 1.005–1.030)
pH: 5 (ref 5.0–8.0)

## 2022-05-20 LAB — HEMOGLOBIN A1C
Hgb A1c MFr Bld: 5.2 % (ref 4.8–5.6)
Mean Plasma Glucose: 102.54 mg/dL

## 2022-05-20 LAB — SURGICAL PCR SCREEN
MRSA, PCR: NEGATIVE
Staphylococcus aureus: NEGATIVE

## 2022-05-20 LAB — POCT ACTIVATED CLOTTING TIME
Activated Clotting Time: 263 seconds
Activated Clotting Time: 281 seconds
Activated Clotting Time: 149 seconds

## 2022-05-20 LAB — APTT: aPTT: 30 seconds (ref 24–36)

## 2022-05-20 SURGERY — BYPASS GRAFT FEMORAL-POPLITEAL ARTERY
Anesthesia: General | Site: Leg Upper | Laterality: Right

## 2022-05-20 MED ORDER — ACETAMINOPHEN 500 MG PO TABS
1000.0000 mg | ORAL_TABLET | Freq: Once | ORAL | Status: AC
  Administered 2022-05-20: 1000 mg via ORAL
  Filled 2022-05-20: qty 2

## 2022-05-20 MED ORDER — EZETIMIBE 10 MG PO TABS
10.0000 mg | ORAL_TABLET | Freq: Every day | ORAL | Status: DC
  Administered 2022-05-21 – 2022-05-22 (×2): 10 mg via ORAL
  Filled 2022-05-20 (×2): qty 1

## 2022-05-20 MED ORDER — PROTAMINE SULFATE 10 MG/ML IV SOLN
INTRAVENOUS | Status: AC
  Filled 2022-05-20: qty 5

## 2022-05-20 MED ORDER — PHENOL 1.4 % MT LIQD: 1.0000 | OROMUCOSAL | Status: DC | PRN

## 2022-05-20 MED ORDER — PROTAMINE SULFATE 10 MG/ML IV SOLN
INTRAVENOUS | Status: DC | PRN
  Administered 2022-05-20: 50 mg via INTRAVENOUS

## 2022-05-20 MED ORDER — CEFAZOLIN SODIUM-DEXTROSE 2-4 GM/100ML-% IV SOLN
2.0000 g | INTRAVENOUS | Status: AC
  Administered 2022-05-20: 2 g via INTRAVENOUS
  Filled 2022-05-20: qty 100

## 2022-05-20 MED ORDER — LIDOCAINE 2% (20 MG/ML) 5 ML SYRINGE
INTRAMUSCULAR | Status: DC | PRN
  Administered 2022-05-20: 60 mg via INTRAVENOUS

## 2022-05-20 MED ORDER — LABETALOL HCL 5 MG/ML IV SOLN: 10.0000 mg | INTRAVENOUS | Status: DC | PRN

## 2022-05-20 MED ORDER — PROPOFOL 10 MG/ML IV BOLUS
INTRAVENOUS | Status: AC
  Filled 2022-05-20: qty 20

## 2022-05-20 MED ORDER — EMPAGLIFLOZIN 10 MG PO TABS
10.0000 mg | ORAL_TABLET | Freq: Every day | ORAL | Status: DC
  Administered 2022-05-21 – 2022-05-22 (×2): 10 mg via ORAL
  Filled 2022-05-20 (×2): qty 1

## 2022-05-20 MED ORDER — HEPARIN SODIUM (PORCINE) 1000 UNIT/ML IJ SOLN
INTRAMUSCULAR | Status: DC | PRN
  Administered 2022-05-20: 2000 [IU] via INTRAVENOUS
  Administered 2022-05-20: 9000 [IU] via INTRAVENOUS

## 2022-05-20 MED ORDER — ONDANSETRON HCL 4 MG/2ML IJ SOLN: 4.0000 mg | Freq: Four times a day (QID) | INTRAMUSCULAR | Status: DC | PRN

## 2022-05-20 MED ORDER — FENTANYL CITRATE (PF) 250 MCG/5ML IJ SOLN
INTRAMUSCULAR | Status: AC
  Filled 2022-05-20: qty 5

## 2022-05-20 MED ORDER — POTASSIUM CHLORIDE CRYS ER 20 MEQ PO TBCR: 20.0000 meq | EXTENDED_RELEASE_TABLET | Freq: Every day | ORAL | Status: DC | PRN

## 2022-05-20 MED ORDER — CHLORHEXIDINE GLUCONATE CLOTH 2 % EX PADS: 6.0000 | MEDICATED_PAD | Freq: Once | CUTANEOUS | Status: DC

## 2022-05-20 MED ORDER — FUROSEMIDE 20 MG PO TABS
20.0000 mg | ORAL_TABLET | Freq: Every day | ORAL | Status: DC
  Administered 2022-05-21 – 2022-05-22 (×2): 20 mg via ORAL
  Filled 2022-05-20 (×2): qty 1

## 2022-05-20 MED ORDER — ALUM & MAG HYDROXIDE-SIMETH 200-200-20 MG/5ML PO SUSP: 15.0000 mL | ORAL | Status: DC | PRN

## 2022-05-20 MED ORDER — PHENYLEPHRINE HCL-NACL 20-0.9 MG/250ML-% IV SOLN
INTRAVENOUS | Status: DC | PRN
  Administered 2022-05-20: 40 ug/min via INTRAVENOUS

## 2022-05-20 MED ORDER — SODIUM CHLORIDE 0.9 % IV SOLN: 500.0000 mL | Freq: Once | INTRAVENOUS | Status: DC | PRN

## 2022-05-20 MED ORDER — FENTANYL CITRATE (PF) 250 MCG/5ML IJ SOLN
INTRAMUSCULAR | Status: DC | PRN
  Administered 2022-05-20 (×5): 50 ug via INTRAVENOUS

## 2022-05-20 MED ORDER — METOPROLOL TARTRATE 5 MG/5ML IV SOLN: 2.0000 mg | INTRAVENOUS | Status: DC | PRN

## 2022-05-20 MED ORDER — CEFAZOLIN SODIUM-DEXTROSE 2-4 GM/100ML-% IV SOLN
2.0000 g | Freq: Three times a day (TID) | INTRAVENOUS | Status: AC
  Administered 2022-05-20 – 2022-05-21 (×2): 2 g via INTRAVENOUS
  Filled 2022-05-20 (×2): qty 100

## 2022-05-20 MED ORDER — HYDROMORPHONE HCL 1 MG/ML IJ SOLN: 0.5000 mg | INTRAMUSCULAR | Status: DC | PRN

## 2022-05-20 MED ORDER — PROPOFOL 10 MG/ML IV BOLUS
INTRAVENOUS | Status: DC | PRN
  Administered 2022-05-20: 70 mg via INTRAVENOUS

## 2022-05-20 MED ORDER — DOCUSATE SODIUM 100 MG PO CAPS
100.0000 mg | ORAL_CAPSULE | Freq: Every day | ORAL | Status: DC
  Administered 2022-05-21 – 2022-05-22 (×2): 100 mg via ORAL
  Filled 2022-05-20 (×2): qty 1

## 2022-05-20 MED ORDER — GUAIFENESIN-DM 100-10 MG/5ML PO SYRP: 15.0000 mL | ORAL_SOLUTION | ORAL | Status: DC | PRN

## 2022-05-20 MED ORDER — ONDANSETRON HCL 4 MG/2ML IJ SOLN
INTRAMUSCULAR | Status: DC | PRN
  Administered 2022-05-20: 4 mg via INTRAVENOUS

## 2022-05-20 MED ORDER — PHENYLEPHRINE 80 MCG/ML (10ML) SYRINGE FOR IV PUSH (FOR BLOOD PRESSURE SUPPORT)
PREFILLED_SYRINGE | INTRAVENOUS | Status: AC
  Filled 2022-05-20: qty 10

## 2022-05-20 MED ORDER — ORAL CARE MOUTH RINSE: 15.0000 mL | Freq: Once | OROMUCOSAL | Status: AC

## 2022-05-20 MED ORDER — HEPARIN 6000 UNIT IRRIGATION SOLUTION
Status: AC
  Filled 2022-05-20: qty 500

## 2022-05-20 MED ORDER — ACETAMINOPHEN 650 MG RE SUPP: 325.0000 mg | RECTAL | Status: DC | PRN

## 2022-05-20 MED ORDER — PHENYLEPHRINE 80 MCG/ML (10ML) SYRINGE FOR IV PUSH (FOR BLOOD PRESSURE SUPPORT)
PREFILLED_SYRINGE | INTRAVENOUS | Status: DC | PRN
  Administered 2022-05-20 (×2): 160 ug via INTRAVENOUS

## 2022-05-20 MED ORDER — HEPARIN SODIUM (PORCINE) 1000 UNIT/ML IJ SOLN
INTRAMUSCULAR | Status: AC
  Filled 2022-05-20: qty 10

## 2022-05-20 MED ORDER — SENNOSIDES-DOCUSATE SODIUM 8.6-50 MG PO TABS: 1.0000 | ORAL_TABLET | Freq: Every evening | ORAL | Status: DC | PRN

## 2022-05-20 MED ORDER — ROSUVASTATIN CALCIUM 5 MG PO TABS
5.0000 mg | ORAL_TABLET | ORAL | Status: DC
  Administered 2022-05-21: 5 mg via ORAL
  Filled 2022-05-20: qty 1

## 2022-05-20 MED ORDER — OXYCODONE HCL 5 MG PO TABS: 5.0000 mg | ORAL_TABLET | Freq: Once | ORAL | Status: DC | PRN

## 2022-05-20 MED ORDER — DEXAMETHASONE SODIUM PHOSPHATE 10 MG/ML IJ SOLN
INTRAMUSCULAR | Status: DC | PRN
  Administered 2022-05-20: 10 mg via INTRAVENOUS

## 2022-05-20 MED ORDER — FENTANYL CITRATE (PF) 100 MCG/2ML IJ SOLN: 25.0000 ug | INTRAMUSCULAR | Status: DC | PRN

## 2022-05-20 MED ORDER — ASPIRIN 81 MG PO TBEC
81.0000 mg | DELAYED_RELEASE_TABLET | Freq: Every day | ORAL | Status: DC
  Administered 2022-05-21 – 2022-05-22 (×2): 81 mg via ORAL
  Filled 2022-05-20 (×3): qty 1

## 2022-05-20 MED ORDER — ROCURONIUM BROMIDE 10 MG/ML (PF) SYRINGE
PREFILLED_SYRINGE | INTRAVENOUS | Status: AC
  Filled 2022-05-20: qty 10

## 2022-05-20 MED ORDER — HEPARIN SODIUM (PORCINE) 5000 UNIT/ML IJ SOLN
5000.0000 [IU] | Freq: Three times a day (TID) | INTRAMUSCULAR | Status: DC
  Administered 2022-05-21 – 2022-05-22 (×4): 5000 [IU] via SUBCUTANEOUS
  Filled 2022-05-20 (×4): qty 1

## 2022-05-20 MED ORDER — ACETAMINOPHEN 325 MG PO TABS
325.0000 mg | ORAL_TABLET | ORAL | Status: DC | PRN
  Administered 2022-05-20 – 2022-05-21 (×3): 650 mg via ORAL
  Filled 2022-05-20 (×3): qty 2

## 2022-05-20 MED ORDER — HEPARIN 6000 UNIT IRRIGATION SOLUTION
Status: DC | PRN
  Administered 2022-05-20: 1

## 2022-05-20 MED ORDER — ONDANSETRON HCL 4 MG/2ML IJ SOLN
INTRAMUSCULAR | Status: AC
  Filled 2022-05-20: qty 2

## 2022-05-20 MED ORDER — LACTATED RINGERS IV SOLN: INTRAVENOUS | Status: DC | PRN

## 2022-05-20 MED ORDER — 0.9 % SODIUM CHLORIDE (POUR BTL) OPTIME
TOPICAL | Status: DC | PRN
  Administered 2022-05-20: 1000 mL

## 2022-05-20 MED ORDER — LACTATED RINGERS IV SOLN: INTRAVENOUS | Status: DC

## 2022-05-20 MED ORDER — SPIRONOLACTONE 12.5 MG HALF TABLET
12.5000 mg | ORAL_TABLET | Freq: Every day | ORAL | Status: DC
  Administered 2022-05-21 – 2022-05-22 (×2): 12.5 mg via ORAL
  Filled 2022-05-20 (×2): qty 1

## 2022-05-20 MED ORDER — PANTOPRAZOLE SODIUM 40 MG PO TBEC
40.0000 mg | DELAYED_RELEASE_TABLET | Freq: Every day | ORAL | Status: DC
  Administered 2022-05-20 – 2022-05-22 (×3): 40 mg via ORAL
  Filled 2022-05-20 (×3): qty 1

## 2022-05-20 MED ORDER — SODIUM CHLORIDE 0.9 % IV SOLN: INTRAVENOUS | Status: DC

## 2022-05-20 MED ORDER — INSULIN ASPART 100 UNIT/ML IJ SOLN: 0.0000 [IU] | Freq: Three times a day (TID) | INTRAMUSCULAR | Status: DC

## 2022-05-20 MED ORDER — BISACODYL 5 MG PO TBEC: 5.0000 mg | DELAYED_RELEASE_TABLET | Freq: Every day | ORAL | Status: DC | PRN

## 2022-05-20 MED ORDER — DEXAMETHASONE SODIUM PHOSPHATE 10 MG/ML IJ SOLN
INTRAMUSCULAR | Status: AC
  Filled 2022-05-20: qty 1

## 2022-05-20 MED ORDER — MAGNESIUM SULFATE 2 GM/50ML IV SOLN: 2.0000 g | Freq: Every day | INTRAVENOUS | Status: DC | PRN

## 2022-05-20 MED ORDER — SUGAMMADEX SODIUM 200 MG/2ML IV SOLN
INTRAVENOUS | Status: DC | PRN
  Administered 2022-05-20: 200 mg via INTRAVENOUS

## 2022-05-20 MED ORDER — ALBUMIN HUMAN 5 % IV SOLN: INTRAVENOUS | Status: DC | PRN

## 2022-05-20 MED ORDER — HYDRALAZINE HCL 20 MG/ML IJ SOLN: 5.0000 mg | INTRAMUSCULAR | Status: DC | PRN

## 2022-05-20 MED ORDER — HEMOSTATIC AGENTS (NO CHARGE) OPTIME
TOPICAL | Status: DC | PRN
  Administered 2022-05-20 (×2): 1 via TOPICAL

## 2022-05-20 MED ORDER — ONDANSETRON HCL 4 MG/2ML IJ SOLN: 4.0000 mg | Freq: Once | INTRAMUSCULAR | Status: DC | PRN

## 2022-05-20 MED ORDER — ROCURONIUM BROMIDE 10 MG/ML (PF) SYRINGE
PREFILLED_SYRINGE | INTRAVENOUS | Status: DC | PRN
  Administered 2022-05-20: 60 mg via INTRAVENOUS
  Administered 2022-05-20: 40 mg via INTRAVENOUS

## 2022-05-20 MED ORDER — METOPROLOL SUCCINATE ER 50 MG PO TB24
50.0000 mg | ORAL_TABLET | Freq: Every day | ORAL | Status: DC
  Administered 2022-05-21 – 2022-05-22 (×2): 50 mg via ORAL
  Filled 2022-05-20 (×2): qty 1

## 2022-05-20 MED ORDER — OXYCODONE HCL 5 MG/5ML PO SOLN: 5.0000 mg | Freq: Once | ORAL | Status: DC | PRN

## 2022-05-20 MED ORDER — CHLORHEXIDINE GLUCONATE 0.12 % MT SOLN
15.0000 mL | Freq: Once | OROMUCOSAL | Status: AC
  Administered 2022-05-20: 15 mL via OROMUCOSAL
  Filled 2022-05-20: qty 15

## 2022-05-20 MED ORDER — OXYCODONE-ACETAMINOPHEN 5-325 MG PO TABS
1.0000 | ORAL_TABLET | ORAL | Status: DC | PRN
  Administered 2022-05-20 – 2022-05-21 (×3): 2 via ORAL
  Filled 2022-05-20 (×4): qty 2

## 2022-05-20 MED ORDER — LIDOCAINE 2% (20 MG/ML) 5 ML SYRINGE
INTRAMUSCULAR | Status: AC
  Filled 2022-05-20: qty 5

## 2022-05-20 SURGICAL SUPPLY — 50 items
BAG COUNTER SPONGE SURGICOUNT (BAG) ×1 IMPLANT
BANDAGE ESMARK 6X9 LF (GAUZE/BANDAGES/DRESSINGS) IMPLANT
BLADE CLIPPER SURG (BLADE) ×1 IMPLANT
BNDG ESMARK 6X9 LF (GAUZE/BANDAGES/DRESSINGS)
CANISTER SUCT 3000ML PPV (MISCELLANEOUS) ×1 IMPLANT
CANNULA VESSEL 3MM 2 BLNT TIP (CANNULA) IMPLANT
CLIP FOGARTY SPRING 6M (CLIP) ×1 IMPLANT
CLIP TI LARGE 6 (CLIP) IMPLANT
CLIP VESOCCLUDE MED 24/CT (CLIP) ×1 IMPLANT
CLIP VESOCCLUDE SM WIDE 24/CT (CLIP) ×1 IMPLANT
COVER PROBE W GEL 5X96 (DRAPES) ×1 IMPLANT
DERMABOND ADVANCED .7 DNX12 (GAUZE/BANDAGES/DRESSINGS) ×1 IMPLANT
DRAPE HALF SHEET 40X57 (DRAPES) IMPLANT
ELECT REM PT RETURN 9FT ADLT (ELECTROSURGICAL) ×1
ELECTRODE REM PT RTRN 9FT ADLT (ELECTROSURGICAL) ×1 IMPLANT
GAUZE 4X4 16PLY ~~LOC~~+RFID DBL (SPONGE) IMPLANT
GLOVE BIO SURGEON STRL SZ7.5 (GLOVE) ×1 IMPLANT
GLOVE BIOGEL PI IND STRL 8 (GLOVE) ×1 IMPLANT
GOWN STRL REUS W/ TWL LRG LVL3 (GOWN DISPOSABLE) ×2 IMPLANT
GOWN STRL REUS W/ TWL XL LVL3 (GOWN DISPOSABLE) ×2 IMPLANT
GOWN STRL REUS W/TWL LRG LVL3 (GOWN DISPOSABLE) ×2
GOWN STRL REUS W/TWL XL LVL3 (GOWN DISPOSABLE) ×2
HEMOSTAT SNOW SURGICEL 2X4 (HEMOSTASIS) IMPLANT
KIT BASIN OR (CUSTOM PROCEDURE TRAY) ×1 IMPLANT
KIT TURNOVER KIT B (KITS) ×1 IMPLANT
NS IRRIG 1000ML POUR BTL (IV SOLUTION) ×2 IMPLANT
PACK PERIPHERAL VASCULAR (CUSTOM PROCEDURE TRAY) ×1 IMPLANT
PAD ARMBOARD 7.5X6 YLW CONV (MISCELLANEOUS) ×2 IMPLANT
PUNCH AORTIC ROTATE 4.0MM (MISCELLANEOUS) IMPLANT
SUT MNCRL AB 4-0 PS2 18 (SUTURE) ×3 IMPLANT
SUT PROLENE 5 0 C 1 24 (SUTURE) IMPLANT
SUT PROLENE 6 0 BV (SUTURE) ×1 IMPLANT
SUT PROLENE 7 0 BV 1 (SUTURE) IMPLANT
SUT SILK 0 TIES 10X30 (SUTURE) IMPLANT
SUT SILK 2 0 (SUTURE) ×1
SUT SILK 2 0 PERMA HAND 18 BK (SUTURE) IMPLANT
SUT SILK 2-0 18XBRD TIE 12 (SUTURE) IMPLANT
SUT SILK 3 0 (SUTURE) ×1
SUT SILK 3-0 18XBRD TIE 12 (SUTURE) IMPLANT
SUT VIC AB 2-0 CT1 27 (SUTURE) ×4
SUT VIC AB 2-0 CT1 TAPERPNT 27 (SUTURE) ×2 IMPLANT
SUT VIC AB 3-0 SH 27 (SUTURE) ×3
SUT VIC AB 3-0 SH 27X BRD (SUTURE) ×3 IMPLANT
TAPE UMBILICAL 1/8X30 (MISCELLANEOUS) IMPLANT
TAPE UMBILICAL COTTON 1/8X30 (MISCELLANEOUS) IMPLANT
TOWEL GREEN STERILE (TOWEL DISPOSABLE) ×1 IMPLANT
TRAY FOLEY MTR SLVR 16FR STAT (SET/KITS/TRAYS/PACK) ×1 IMPLANT
TUBING EXTENTION W/L.L. (IV SETS) IMPLANT
UNDERPAD 30X36 HEAVY ABSORB (UNDERPADS AND DIAPERS) ×1 IMPLANT
WATER STERILE IRR 1000ML POUR (IV SOLUTION) ×1 IMPLANT

## 2022-05-20 NOTE — Progress Notes (Signed)
Notified Dr. Carlis Abbott that pt took 15 mg Meloxicam this am . No new orders at this time.

## 2022-05-20 NOTE — Anesthesia Postprocedure Evaluation (Signed)
Anesthesia Post Note  Patient: [name] [name]  Procedure(s) Performed: RIGHT ABOVE KNEE POPLITEAL TO TIBIOPERONEAL TRUNK BYPASS (Right) ANTERIOR TIBIAL VEIN LIGATION (Right: Leg Upper) LIGATION OF POPLITEAL ARTERY ANEURYSM (Right: Leg Upper) GREATER SAPHENOUS VEIN HARVEST (Right: Leg Upper)     Patient location during evaluation: PACU Anesthesia Type: General Level of consciousness: awake and alert Pain management: pain level controlled Vital Signs Assessment: post-procedure vital signs reviewed and stable Respiratory status: spontaneous breathing, nonlabored ventilation and respiratory function stable Cardiovascular status: stable and blood pressure returned to baseline Anesthetic complications: no   No notable events documented.  Last Vitals:  Vitals:   05/20/22 1400 05/20/22 1415  BP: 107/62 109/67  Pulse: 65 67  Resp: 18 14  Temp:    SpO2: 96% 95%    Last Pain:  Vitals:   05/20/22 1400  TempSrc:   PainSc: 0-No pain                 Audry Pili

## 2022-05-20 NOTE — H&P (Signed)
History and Physical Interval Note:  05/20/2022 8:09 AM  [name]  has presented today for surgery, with the diagnosis of Popliteal artery aneurysm.  The various methods of treatment have been discussed with the patient and family. After consideration of risks, benefits and other options for treatment, the patient has consented to  Procedure(s): RIGHT FEMORAL-POPLITEAL BYPASS WITH EXCLUSION OF POPLITEAL ANEURYSM (Right) as a surgical intervention.  The patient's history has been reviewed, patient examined, no change in status, stable for surgery.  I have reviewed the patient's chart and labs.  Questions were answered to the patient's satisfaction.    Right lower extremity bypass for exclusion of 4.7 cm popliteal artery aneurysm.  He underwent angiogram last week to evaluate for targets.  Not a stent graft candidate.  He has completed his chemotherapy for lymphoma and PET scan on 05/01/2022 showed no evidence of recurrent lymphoma.  Not on high-dose steroids anymore either.  Risk benefits discussed.  [name]  Patient name: ELSIE SAKUMA        MRN: [id number]        DOB: 1946/03/06          Sex: male   REASON FOR CONSULT: Follow-up to discuss repair of right popliteal artery aneurysm   HPI: JAMEIR AKE is a 76 y.o. male, with history of lymphoma undergoing chemotherapy, hypertension, hyperlipidemia, coronary artery disease status post CABG that presents for follow-up to discuss right popliteal artery aneurysm.  He was initially seen by Dr. Donnetta Hutching in [place].  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  He was undergoing chemotherapy for lymphoma and Dr. Donnetta Hutching recommended he follow-up with me in 1 to 2 months.  Patient states he has now completed his chemotherapy approximately 3 weeks ago.  He is due for a PET scan on 9/21.  Complains of swelling in his right leg.          Past Medical  History:  Diagnosis Date   ALLERGIC RHINITIS 10/31/2007    Qualifier: Diagnosis of  By: Jenny Reichmann MD, [place]    Allergy     CHOLECYSTECTOMY, HX OF 06/04/2007    Qualifier: Diagnosis of  By: Danny Lawless CMA, [place]     COLONIC POLYPS, HX OF 10/31/2007    Qualifier: Diagnosis of  By: Jenny Reichmann MD, [place] ARTERY BYPASS GRAFT, HX OF 06/04/2007    Qualifier: Diagnosis of  By: [place], [place]     CORONARY ARTERY DISEASE 06/04/2007    Qualifier: Diagnosis of  By: Danny Lawless CMA, [place]     Diverticulosis     Erectile dysfunction     Eye twitch      right eye since chilhood    HYPERLIPIDEMIA 06/04/2007    Qualifier: Diagnosis of  By: [place], [place]     Hypersomnolence     HYPERTENSION 06/04/2007    Qualifier: Diagnosis of  By: [place], [place]     Hypothyroidism     ISCHEMIC CARDIOMYOPATHY 06/04/2007    Qualifier: Diagnosis of  By: Danny Lawless CMA, [place]     Myocardial infarction ([place])      per patient , his occurred in 1998    Osteoarthritis     PLMD (periodic limb movement disorder)     Sleep apnea      no cpap' per patient , "i was checked for it and they said i didnt have it "  Vertigo     Vitamin D deficiency                 Past Surgical History:  Procedure Laterality Date   BIOPSY   11/25/2021    Procedure: BIOPSY;  Surgeon: Jackquline Denmark, MD;  Location: [facility];  Service: Gastroenterology;;   CHOLECYSTECTOMY       COLONOSCOPY       COLONOSCOPY WITH PROPOFOL N/A 11/25/2021    Procedure: COLONOSCOPY WITH PROPOFOL;  Surgeon: Jackquline Denmark, MD;  Location: [facility];  Service: Gastroenterology;  Laterality: N/A;   CORONARY ARTERY BYPASS GRAFT       ESOPHAGOGASTRODUODENOSCOPY (EGD) WITH PROPOFOL N/A 11/25/2021    Procedure: ESOPHAGOGASTRODUODENOSCOPY (EGD) WITH PROPOFOL;  Surgeon: Jackquline Denmark, MD;  Location: [facility];  Service: Gastroenterology;  Laterality: N/A;   ICD IMPLANT N/A 01/30/2020    Procedure: ICD IMPLANT;  Surgeon: Thompson Grayer, MD;  Location:  [facility];  Service: Cardiovascular;  Laterality: N/A;   IR IMAGING GUIDED PORT INSERTION   12/04/2021   KNEE ARTHROPLASTY Left 08/05/2017    Procedure: LEFT TOTAL KNEE ARTHROPLASTY WITH COMPUTER NAVIGATION;  Surgeon: Rod Can, MD;  Location: [facility];  Service: Orthopedics;  Laterality: Left;  Needs RNFA   PENILE PROSTHESIS IMPLANT       POLYPECTOMY   11/25/2021    Procedure: POLYPECTOMY;  Surgeon: Jackquline Denmark, MD;  Location: [facility];  Service: Gastroenterology;;   REPLACEMENT TOTAL KNEE   06/12/2013   spinal cyst       THORACOTOMY        left anterior; wound exploration and debridement   TONSILLECTOMY AND ADENOIDECTOMY        age 2               Family History  Problem Relation Age of Onset   Stomach cancer Mother          smokes   Lung cancer Father          chewed tobacco   Heart disease Brother          first MI at 74yo, now 10 for transplant list/ ICM   Pancreatic cancer Brother     Colon cancer Maternal Grandfather     COPD Son          was a smoker   Pancreatic cancer Maternal Uncle     Pancreatic cancer Cousin          mat side x 4   Esophageal cancer Neg Hx     Prostate cancer Neg Hx     Rectal cancer Neg Hx        SOCIAL HISTORY: Social History             Socioeconomic History   Marital status: Married      Spouse name: [name]   Number of children: 3   Years of education: Not on file   Highest education level: Not on file  Occupational History   Occupation: truck Archivist: [name]  Tobacco Use   Smoking status: Former      Packs/day: 4.00      Years: 17.00      Total pack years: 68.00      Types: Cigarettes      Quit date: 05/07/1981      Years since quitting: 40.9      Passive exposure: Never   Smokeless tobacco: Never   Tobacco comments:  Pt states that he would let most of them "burn" pt states that he used anywhere between 4-5PPD  Vaping Use   Vaping Use: Never used  Substance and Sexual Activity    Alcohol use: Yes      Alcohol/week: 0.0 standard drinks of alcohol      Comment: rare   Drug use: No   Sexual activity: Not on file  Other Topics Concern   Not on file  Social History Narrative    Lives in [place] with spouse    Retired Administrator    Social Determinants of Health           Financial Resource Strain: [place]  (10/27/2021)    Overall Financial Resource Strain (CARDIA)     Difficulty of Paying Living Expenses: Not hard at all  Food Insecurity: No Food Insecurity (10/27/2021)    Hunger Vital Sign     Worried About Running Out of Food in the Last Year: Never true     Ran Out of Food in the Last Year: Never true  Transportation Needs: No Transportation Needs (10/27/2021)    PRAPARE - Armed forces logistics/support/administrative officer (Medical): No     Lack of Transportation (Non-Medical): No  Physical Activity: Inactive (10/27/2021)    Exercise Vital Sign     Days of Exercise per Week: 0 days     Minutes of Exercise per Session: 0 min  Stress: No Stress Concern Present (10/27/2021)    [place]     Feeling of Stress : Not at all  Social Connections: Moderately Isolated (10/27/2021)    Social Connection and Isolation Panel [NHANES]     Frequency of Communication with Friends and Family: More than three times a week     Frequency of Social Gatherings with Friends and Family: More than three times a week     Attends Religious Services: Never     Marine scientist or Organizations: No     Attends Archivist Meetings: Never     Marital Status: Married  Human resources officer Violence: Not At Risk (10/27/2021)    Humiliation, Afraid, Rape, and Kick questionnaire     Fear of Current or Ex-Partner: No     Emotionally Abused: No     Physically Abused: No     Sexually Abused: No               Allergies  Allergen Reactions   Ace Inhibitors Cough   Statins Other (See Comments)      Muscle pains                  Current Outpatient Medications  Medication Sig Dispense Refill   ARTIFICIAL TEAR SOLUTION OP Place 1 drop into both eyes daily as needed (dry eyes).       Ascorbic Acid (VITA-C PO) Take 1,000 mg by mouth daily.       b complex vitamins capsule Take 1 capsule by mouth daily.       empagliflozin (JARDIANCE) 10 MG TABS tablet Take 10 mg by mouth daily.       ezetimibe (ZETIA) 10 MG tablet TAKE 1 TABLET EVERY DAY (Patient taking differently: Take by mouth every other day.) 90 tablet 3   feeding supplement (ENSURE ENLIVE / ENSURE PLUS) LIQD Take 237 mLs by mouth 3 (three) times daily between meals. 237 mL 12   furosemide (LASIX) 20 MG tablet Take  1 tablet (20 mg total) by mouth daily. 90 tablet 3   lidocaine-prilocaine (EMLA) cream Apply a small amount to port a cath site (do not rub in) and cover with plastic wrap 1 hour prior to infusion appointments 30 g 3   metoprolol succinate (TOPROL-XL) 25 MG 24 hr tablet Take 1 tablet (25 mg total) by mouth daily. Take with or immediately following a meal. 30 tablet 6   Multiple Vitamins-Minerals (MULTIVITAMIN WITH MINERALS) tablet Take 1 tablet by mouth daily. Centrum silver       prochlorperazine (COMPAZINE) 10 MG tablet Take 1 tablet (10 mg total) by mouth every 6 (six) hours as needed for nausea or vomiting. 30 tablet 3   rosuvastatin (CRESTOR) 5 MG tablet TAKE 1 TABLET EVERY OTHER DAY 45 tablet 10   spironolactone (ALDACTONE) 25 MG tablet TAKE 1/2 TABLET EVERY DAY 45 tablet 3   triamcinolone (NASACORT) 55 MCG/ACT AERO nasal inhaler Place 2 sprays into the nose daily. (Patient taking differently: Place 2 sprays into the nose daily as needed (allergies).) 3 Inhaler 3   VITAMIN D PO Take 1,000 Units by mouth daily.       warfarin (COUMADIN) 5 MG tablet TAKE 1 TABLET EVERY DAY AT 4 PM (Patient taking differently: Take 2.5-5 mg by mouth See admin instructions. Taking 5 mg every day except on Friday taking 1/2 tablet = 2.5 mg) 90 tablet 4    allopurinol (ZYLOPRIM) 300 MG tablet Take 1 tablet (300 mg total) by mouth daily. (Patient not taking: Reported on 04/21/2022) 30 tablet 3   predniSONE (DELTASONE) 20 MG tablet Take 5 tablets (100 mg total) by mouth daily with breakfast. Take 100 mg by mouth daily w/food (Patient not taking: Reported on 04/21/2022) 15 tablet 0    No current facility-administered medications for this visit.      REVIEW OF SYSTEMS:  '[X]'$  denotes positive finding, '[ ]'$  denotes negative finding Cardiac   Comments:  Chest pain or chest pressure:      Shortness of breath upon exertion:      Short of breath when lying flat:      Irregular heart rhythm:             Vascular      Pain in calf, thigh, or hip brought on by ambulation:      Pain in feet at night that wakes you up from your sleep:       Blood clot in your veins:      Leg swelling:  x Right         Pulmonary      Oxygen at home:      Productive cough:       Wheezing:              Neurologic      Sudden weakness in arms or legs:       Sudden numbness in arms or legs:       Sudden onset of difficulty speaking or slurred speech:      Temporary loss of vision in one eye:       Problems with dizziness:              Gastrointestinal      Blood in stool:       Vomited blood:              Genitourinary      Burning when urinating:       Blood in urine:  Psychiatric      Major depression:              Hematologic      Bleeding problems:      Problems with blood clotting too easily:             Skin      Rashes or ulcers:             Constitutional      Fever or chills:          PHYSICAL EXAM:      Vitals:    04/21/22 1507  BP: 117/72  Pulse: (!) 55  Resp: 18  Temp: 97.9 F (36.6 C)  TempSrc: Temporal  SpO2: 98%  Weight: 182 lb (82.6 kg)  Height: '5\' 11"'$  (1.803 m)      GENERAL: The patient is a well-nourished male, in no acute distress. The vital signs are documented above. CARDIAC: There is a regular rate and  rhythm.  VASCULAR:  Right femoral pulse palpable Prominent right popliteal pulse palpable Right DP palpable PULMONARY: No respiratory distress ABDOMEN: Soft and non-tender with normal pitched bowel sounds.  MUSCULOSKELETAL: There are no major deformities or cyanosis. NEUROLOGIC: No focal weakness or paresthesias are detected. SKIN: There are no ulcers or rashes noted. PSYCHIATRIC: The patient has a normal affect.   DATA:    CTA reviewed from 03/03/2022 with 4.7 cm right popliteal artery aneurysm   Assessment/Plan:   76 year old male presents for follow-up to discuss repair of right popliteal artery aneurysm after initial evaluation by Dr. Donnetta Hutching in [place].  I reviewed his CT scan that confirms a 4.7 cm right popliteal artery aneurysm.  Unfortunately, I do not think he is a good candidate for stent graft repair given the anterior tibial takes off at the aneurysm and would require covering the anterior tibial and there is also a significant size mismatch between the SFA and the TP trunk where you would have to stent distally that can lead to significant endoleak's and not getting seal.  I have recommended an aortogram with lower extremity arteriogram to further evaluate his runoff and likely plan a bypass.  I will schedule this in several weeks once he gets his PET scan to ensure he does not need any additional chemotherapy for his lymphoma and will tentatively schedule for 9/28 .  This will also confirm that he is not a stent graft candidate.  Discussed that arteriogram will evaluate dominant runoff to the we can plan bypass with exclusion of the aneurysm.  We will schedule surgery after we evaluate his arteriogram images.  Discussed that his popliteal aneurysm at this size will put him at risk of thrombosis and embolization that can lead to limb loss.     [name], MD [facility] Office: [phone]

## 2022-05-20 NOTE — Progress Notes (Signed)
Removed foley cath per MD order. Pt tolerated well.   Lavenia Atlas, RN

## 2022-05-20 NOTE — Discharge Instructions (Signed)
 [facility]  Discharge instructions  Lower Extremity Bypass Surgery  Please refer to the following instruction for your post-procedure care. Your surgeon or physician assistant will discuss any changes with you.  Activity  You are encouraged to walk as much as you can. You can slowly return to normal activities during the month after your surgery. Avoid strenuous activity and heavy lifting until your doctor tells you it's OK. Avoid activities such as vacuuming or swinging a golf club. Do not drive until your doctor give the OK and you are no longer taking prescription pain medications. It is also normal to have difficulty with sleep habits, eating and bowel movement after surgery. These will go away with time.  Bathing/Showering  Shower daily after you go home. Do not soak in a bathtub, hot tub, or swim until the incision heals completely.  Incision Care  Clean your incision with mild soap and water. Shower every day. Pat the area dry with a clean towel. You do not need a bandage unless otherwise instructed. Do not apply any ointments or creams to your incision. If you have open wounds you will be instructed how to care for them or a visiting nurse may be arranged for you. If you have staples or sutures along your incision they will be removed at your post-op appointment. You may have skin glue on your incision. Do not peel it off. It will come off on its own in about one week.  Wash the groin wound with soap and water daily and pat dry. (No tub bath-only shower)  Then put a dry gauze or washcloth in the groin to keep this area dry to help prevent wound infection.  Do this daily and as needed.  Do not use Vaseline or neosporin on your incisions.  Only use soap and water on your incisions and then protect and keep dry.  Diet  Resume your normal diet. There are no special food restrictions following this procedure. A low fat/ low cholesterol diet is  recommended for all patients with vascular disease. In order to heal from your surgery, it is CRITICAL to get adequate nutrition. Your body requires vitamins, minerals, and protein. Vegetables are the best source of vitamins and minerals. Vegetables also provide the perfect balance of protein. Processed food has little nutritional value, so try to avoid this.  Medications  Resume taking all your medications unless your doctor or physician assistant tells you not to. If your incision is causing pain, you may take over-the-counter pain relievers such as acetaminophen (Tylenol). If you were prescribed a stronger pain medication, please aware these medication can cause nausea and constipation. Prevent nausea by taking the medication with a snack or meal. Avoid constipation by drinking plenty of fluids and eating foods with high amount of fiber, such as fruits, vegetables, and grains. Take Colace 100 mg (an over-the-counter stool softener) twice a day as needed for constipation.  Do not take Tylenol if you are taking prescription pain medications.  Follow Up  Our office will schedule a follow up appointment 2-3 weeks following discharge.  Please call us immediately for any of the following conditions  Severe or worsening pain in your legs or feet while at rest or while walking Increase pain, redness, warmth, or drainage (pus) from your incision site(s) Fever of 101 degree or higher The swelling in your leg with the bypass suddenly worsens and becomes more painful than when you were in the hospital If you have   been instructed to feel your graft pulse then you should do so every day. If you can no longer feel this pulse, call the office immediately. Not all patients are given this instruction.  Leg swelling is common after leg bypass surgery.  The swelling should improve over a few months following surgery. To improve the swelling, you may elevate your legs above the level of your heart while you are  sitting or resting. Your surgeon or physician assistant may ask you to apply an ACE wrap or wear compression (TED) stockings to help to reduce swelling.  Reduce your risk of vascular disease  Stop smoking. If you would like help call [facility] at 1-800-QUIT-NOW (1-800-784-8669) or [place] at [phone].  Manage your cholesterol Maintain a desired weight Control your diabetes weight Control your diabetes Keep your blood pressure down  If you have any questions, please call the office at [phone]  

## 2022-05-20 NOTE — Anesthesia Procedure Notes (Signed)
Arterial Line Insertion Start/End10/06/2022 8:00 AM, 05/20/2022 8:10 AM Performed by: Reece Agar, CRNA  Patient location: Pre-op. Preanesthetic checklist: patient identified, IV checked, site marked, risks and benefits discussed, surgical consent, monitors and equipment checked, pre-op evaluation, timeout performed and anesthesia consent Lidocaine 1% used for infiltration Right, radial was placed Catheter size: 20 G Hand hygiene performed , maximum sterile barriers used  and Seldinger technique used Allen's test indicative of satisfactory collateral circulation Attempts: 1 Procedure performed without using ultrasound guided technique. Following insertion, dressing applied and Biopatch. Post procedure assessment: normal and unchanged  Patient tolerated the procedure well with no immediate complications.

## 2022-05-20 NOTE — Transfer of Care (Signed)
Immediate Anesthesia Transfer of Care Note  Patient: [name] [name]  Procedure(s) Performed: RIGHT ABOVE KNEE POPLITEAL TO TIBIOPERONEAL TRUNK BYPASS (Right) ANTERIOR TIBIAL VEIN LIGATION (Right: Leg Upper) LIGATION OF POPLITEAL ARTERY ANEURYSM (Right: Leg Upper) GREATER SAPHENOUS VEIN HARVEST (Right: Leg Upper)  Patient Location: PACU  Anesthesia Type:General  Level of Consciousness: awake and alert   Airway & Oxygen Therapy: Patient Spontanous Breathing and Patient connected to face mask oxygen  Post-op Assessment: Report given to RN, Post -op Vital signs reviewed and stable, and Patient moving all extremities X 4  Post vital signs: Reviewed and stable  Last Vitals:  Vitals Value Taken Time  BP 121/74 05/20/22 1336  Temp    Pulse 63 05/20/22 1341  Resp 18 05/20/22 1341  SpO2 97 % 05/20/22 1341  Vitals shown include unvalidated device data.  Last Pain:  Vitals:   05/20/22 0712  TempSrc: Oral  PainSc: 0-No pain         Complications: No notable events documented.

## 2022-05-20 NOTE — Progress Notes (Signed)
Pt admitted to rm 8 from PACU. CHG wipe given. Intimated tele. VSS. Oriented pt to the unit. Call bell within reach.  Lavenia Atlas, RN

## 2022-05-20 NOTE — Op Note (Signed)
Date: May 20, 2022  Preoperative diagnosis: 4.7 cm right popliteal artery aneurysm  Postoperative diagnosis: Same  Procedure: 1.  Harvest of right leg great saphenous vein 2.  Right above-knee popliteal artery to tibioperoneal trunk/posterior tibial artery bypass with ipsilateral reversed great saphenous vein 3.  Ligation of right popliteal artery aneurysm 4.  Ligation of right anterior tibial artery  Surgeon: Dr. [name], MD  Assistant: Paulo Fruit, PA  Indications: 76 year old male with a 4.7 cm right popliteal artery aneurysm initially seen by Dr. Donnetta Hutching in [place].  He was sent to me for evaluation of possible stent graft but due to the significant mismatch between the proximal and distal seal zones I did not feel he was a good candidate for stent graft repair of his popliteal aneurysm.  He presents today for right lower extremity bypass and exclusion of popliteal artery aneurysm after risks and benefits discussed.  An assistant was needed for exposure and to expedite the case and for sewing the bypass anastomosis.  Findings: The right great saphenous vein was harvested from the saphenofemoral junction to the distal thigh and was of excellent caliber.  I then got the above-knee popliteal artery out proximal to the aneurysm for the inflow and then distally we elected to go to the tibioperoneal trunk for our target.  The below-knee popliteal exposure was more challenging given there was significant inflammation from the aneurysm and the anterior tibial artery actually came off the aneurysm sac.  Ultimately a reversed bypass was sewn from the above-knee popliteal artery onto the tibioperoneal trunk and proximal posterior tibial artery tunneled anatomic behind the knee.  The above-knee popliteal artery was ligated distal to the proximal bypass and the tibioperoneal trunk was ligated proximal to the distal bypass in order to exclude the popliteal artery aneurysm.  I also  dissected out the anterior tibial artery and ligated this since the artery came off the aneurysm sac and I was worried it could lead to continued flow into the aneurysm sac with ongoing growth. There was a brisk peroneal, anterior tibial, and posterior tibial signal at completion.  Anesthesia: General  Details: The patient was taken to the operating room after informed consent was obtained.  Placed on the operating table in supine position.  General endotracheal anesthesia was induced.  I then used the ultrasound probe and evaluated the great saphenous vein in the right thigh and this was of excellent caliber and was marked from the junction to the distal thigh.  We then made two skip incisions in the right thigh and the great saphenous vein was harvested from the saphenofemoral junction down to the distal thigh and all side branches were ligated between 3-0 silk ties and divided.  Through the more distal incision for vein harvest we then went anterior to the sartorius and entered the popliteal space and the above-knee popliteal artery was dissected out and controlled with Vesseloops proximal to the aneurysm.  I then went below the knee and made a longitudinal incision medial on the proximal calf and I tried to preserve the saphenous vein as well as the nerve here.  We entered the popliteal space and there was a very large pulsatile aneurysm.  This was significantly inflammation and we ultimately had to go distal in order to find a decent plane for dissection.  The tibioperoneal trunk as well as the posterior tibial and peroneal arteries were then dissected out.  The veins were significantly adherent here and we had to make multiple repairs with  5-0 Prolene's.  Ultimately we got out the tibioperoneal trunk as well as the peroneal and posterior tibial artery.  The anterior tibial vein was ligated between 2-0 silk ties and divided with clips as well and ultimately it was apparent that the anterior tibial artery  came off the popliteal artery aneurysm.  I was able to get circumferential control of the anterior tibial artery in order to ligated this and prevent any retrograde flow and continued filling into the aneurysm sac.  The great saphenous vein was then ligated at the saphenofemoral junction all the way down to the above-knee popliteal exposure.  A vessel cannula was placed once it was reversed and this dilated nicely and multiple branches were repaired with 6-0 Prolene's.  I then tunneled this behind the knee after blunt dissection around the aneurysm and we passed this with a with a uterine clamp making sure not to twist it after it was marked for orientation.  This was tunneled anatomic.  Patient was then given 100 units/kg IV heparin.  ACT's were checked to maintain greater than 250.  At this point in time I went ahead and ligated the above-knee popliteal artery distal to our proximal anastomosis.  I also ligated the proximal tibioperoneal trunk to prevent any further embolization from the aneurysm.  Both of the segments were ligated with multiple 0 silk sutures and a large vascular clip.  The above-knee popliteal artery was then controlled with a baby profunda this was opened with 11 blade scalpel and arteriotomy was opened with Potts scissors and a 4 mm punch was used.  I then spatulated the vein in reversed fashion and a end to side anastomosis was sewn to the above-knee popliteal artery with 5-0 Prolene using parachute technique.  Once we came off clamps we had excellent pulsatile flow distally in the bypass.  I then controlled the peroneal and posterior tibial with Vesseloops and again we already ligated the proximal tibioperoneal trunk.  I then opened the tibioperoneal trunk onto the proximal posterior tibial artery and the vein was cut to the appropriate length and spatulated and an end to side anastomosis was sewn with 6-0 Prolene to the TP trunk onto the proximal posterior tibial artery.  This was de-aired  prior to completion.  We had backbleeding from the posterior tibial and peroneal artery.  I did put multiple repair sutures in the distal anastomosis.  Once the distal anastomosis was hemostatic and then tied down the 0 silk suture placed around the anterior tibial to prevent any continued filling of the aneurysm sac from retrograde flow.  We had excellent signals in the foot and the peroneal and anterior tibial.  Protamine was given for reversal.  All of the incisions were irrigated out and then we used Surgicel snow and Surgicel powder for hemostasis.  Incisions were closed in multiple layers of 2-0 Vicryl, 3-0 Vicryl, 4-0 Monocryl and Dermabond.  Taken to recovery in stable condition.  Complication: None  Condition: Stable  [name], MD [facility] Office: [place]

## 2022-05-20 NOTE — Anesthesia Procedure Notes (Signed)
Procedure Name: Intubation Date/Time: 05/20/2022 8:43 AM  Performed by: Reece Agar, CRNAPre-anesthesia Checklist: Patient identified, Emergency Drugs available, Suction available and Patient being monitored Patient Re-evaluated:Patient Re-evaluated prior to induction Oxygen Delivery Method: Circle System Utilized Preoxygenation: Pre-oxygenation with 100% oxygen Induction Type: IV induction Ventilation: Mask ventilation without difficulty Laryngoscope Size: Mac and 4 Grade View: Grade I Tube type: Oral Number of attempts: 1 Airway Equipment and Method: Stylet Placement Confirmation: ETT inserted through vocal cords under direct vision, positive ETCO2 and breath sounds checked- equal and bilateral Secured at: 23 cm Tube secured with: Tape Dental Injury: Teeth and Oropharynx as per pre-operative assessment

## 2022-05-21 ENCOUNTER — Other Ambulatory Visit (HOSPITAL_COMMUNITY): Payer: Medicare HMO

## 2022-05-21 ENCOUNTER — Encounter (HOSPITAL_COMMUNITY): Payer: Self-pay | Admitting: Vascular Surgery

## 2022-05-21 LAB — BASIC METABOLIC PANEL
Anion gap: 9 (ref 5–15)
BUN: 21 mg/dL (ref 8–23)
CO2: 22 mmol/L (ref 22–32)
Calcium: 8.4 mg/dL — ABNORMAL LOW (ref 8.9–10.3)
Chloride: 103 mmol/L (ref 98–111)
Creatinine, Ser: 1.12 mg/dL (ref 0.61–1.24)
GFR, Estimated: >60 mL/min (ref >60)
Glucose, Bld: 163 mg/dL — ABNORMAL HIGH (ref 70–99)
Potassium: 4.1 mmol/L (ref 3.5–5.1)
Sodium: 134 mmol/L — ABNORMAL LOW (ref 135–145)

## 2022-05-21 LAB — CBC
HCT: 26.6 % — ABNORMAL LOW (ref 39.0–52.0)
Hemoglobin: 9 g/dL — ABNORMAL LOW (ref 13.0–17.0)
MCH: 31.6 pg (ref 26.0–34.0)
MCHC: 33.8 g/dL (ref 30.0–36.0)
MCV: 93.3 fL (ref 80.0–100.0)
Platelets: 85 10*3/uL — ABNORMAL LOW (ref 150–400)
RBC: 2.85 MIL/uL — ABNORMAL LOW (ref 4.22–5.81)
RDW: 14.3 % (ref 11.5–15.5)
WBC: 6.6 10*3/uL (ref 4.0–10.5)
nRBC: 0 % (ref 0.0–0.2)

## 2022-05-21 LAB — LIPID PANEL
Cholesterol: 109 mg/dL (ref 0–200)
HDL: 29 mg/dL — ABNORMAL LOW (ref >40)
LDL Cholesterol: 65 mg/dL (ref 0–99)
Total CHOL/HDL Ratio: 3.8 RATIO
Triglycerides: 73 mg/dL (ref <150)
VLDL: 15 mg/dL (ref 0–40)

## 2022-05-21 MED ORDER — ENSURE ENLIVE PO LIQD
237.0000 mL | Freq: Two times a day (BID) | ORAL | Status: DC
  Administered 2022-05-21 (×2): 237 mL via ORAL

## 2022-05-21 NOTE — Progress Notes (Signed)
PHARMACIST LIPID MONITORING   [name] [name] is a 76 y.o. male admitted on 05/20/2022 for lower extremity artery bypass and ligation of right popliteal artery aneurysm and tibial artery.  Pharmacy has been consulted to optimize lipid-lowering therapy with the indication of secondary prevention for clinical ASCVD.  Recent Labs:  Lipid Panel (last 6 months):   Lab Results  Component Value Date   CHOL 109 05/21/2022   TRIG 73 05/21/2022   HDL 29 (L) 05/21/2022   CHOLHDL 3.8 05/21/2022   VLDL 15 05/21/2022   LDLCALC 65 05/21/2022    Hepatic function panel (last 6 months):   Lab Results  Component Value Date   AST 21 05/20/2022   ALT 17 05/20/2022   ALKPHOS 46 05/20/2022   BILITOT 0.9 05/20/2022    SCr (since admission):   Serum creatinine: 1.12 mg/dL 05/21/22 0422 Estimated creatinine clearance: 58.8 mL/min  Current therapy and lipid therapy tolerance Current lipid-lowering therapy: crestor '5mg'$  qod and zetia '10mg'$  Previous lipid-lowering therapies (if applicable): n/a Documented or reported allergies or intolerances to lipid-lowering therapies (if applicable): generic allergy to statins causing muscle pains  Assessment:   Patient prefers no changes in lipid-lowering therapy at this time due to LDL at goal and history of intolerance to statins.   Plan:    1.Statin intensity (high intensity recommended for all patients regardless of the LDL):  Statin intolerance noted. No statin changes due to serious side effects (ex. Myalgias with at least 2 different statins).  2.Add ezetimibe (if any one of the following): on zetia '10mg'$  daily  3.Refer to lipid clinic:   No  4.Follow-up with:  Primary care provider - Biagio Borg, MD  5.Follow-up labs after discharge:  Changes in lipid therapy were made. Check a lipid panel in 8-12 weeks then annually.     Erin Hearing PharmD., BCPS Clinical Pharmacist 05/21/2022 8:16 AM

## 2022-05-21 NOTE — Evaluation (Signed)
Physical Therapy Evaluation Patient Details Name: [name] [name] MRN: [id number] DOB: Feb 01, 1946 Today's Date: 05/21/2022  History of Present Illness  Pt is a 76 y/o M admitted on 05/20/22 for R above-knee popliteal/femoral artery  BPG. PMH of CHF, CAD (s/p CABG), HLD, cardiomyopathy, OA, PVD, vertigo, hypothyroidism. lymphoma   Clinical Impression  Patient evaluated by Physical Therapy with no further acute PT needs identified. All education has been completed and the patient has no further questions. Pt to be deferred to mobility specialist for mobility needs during remainder of hospital stay.    Recommendations for follow up therapy are one component of a multi-disciplinary discharge planning process, led by the attending physician.  Recommendations may be updated based on patient status, additional functional criteria and insurance authorization.  Follow Up Recommendations No PT follow up      Assistance Recommended at Discharge PRN  Patient can return home with the following  Assistance with cooking/housework;Help with stairs or ramp for entrance    Equipment Recommendations    Recommendations for Other Services       Functional Status Assessment Patient has not had a recent decline in their functional status     Precautions / Restrictions Precautions Precautions: Fall Restrictions Weight Bearing Restrictions: No      Mobility  Bed Mobility Overal bed mobility: Needs Assistance Bed Mobility: Supine to Sit, Sit to Supine     Supine to sit: Modified independent (Device/Increase time), HOB elevated Sit to supine: Modified independent (Device/Increase time)   General bed mobility comments: able to scoot and perform bed mobiltiy with single hand rail mod ind for increased time    Transfers Overall transfer level: Needs assistance Equipment used: Rolling walker (2 wheels) Transfers: Sit to/from Stand Sit to Stand: Supervision           General transfer  comment: pt performed two sit to stands with minimal use of UE support from decreased bed height    Ambulation/Gait Ambulation/Gait assistance: Supervision Gait Distance (Feet): 300 Feet Assistive device: None Gait Pattern/deviations: Antalgic, Decreased stride length Gait velocity: decreased     General Gait Details: lateral trunk lurch during stance, ambualted without device but did reach for rails in hallway for steadying  Stairs            Wheelchair Mobility    Modified Rankin (Stroke Patients Only)       Balance Overall balance assessment: Needs assistance Sitting-balance support: No upper extremity supported, Feet supported, Feet unsupported Sitting balance-Leahy Scale: Good Sitting balance - Comments: able to perform dynamic sitting balance while donning socks     Standing balance-Leahy Scale: Good Standing balance comment: dynamic balance would pose mild challenge when reaching out of BOS without assistive device                             Pertinent Vitals/Pain Pain Assessment Pain Assessment: Faces Faces Pain Scale: Hurts a little bit Pain Location: R calf/ankle Pain Descriptors / Indicators: Numbness, Nagging Pain Intervention(s): Monitored during session, Limited activity within patient's tolerance    Home Living Family/patient expects to be discharged to:: Private residence Living Arrangements: Spouse/significant other Available Help at Discharge: Available 24 hours/day Type of Home: Mobile home Home Access: Stairs to enter Entrance Stairs-Rails: None Entrance Stairs-Number of Steps: 3   Home Layout: One level Home Equipment: Cane - single Barista (2 wheels)      Prior Function Prior Level of Function : Independent/Modified Independent;Driving  Mobility Comments: cane on occasion, is improving in strength post termination of chemo and uses as needed       Hand Dominance   Dominant Hand: Right     Extremity/Trunk Assessment   Upper Extremity Assessment Upper Extremity Assessment: Overall WFL for tasks assessed    Lower Extremity Assessment Lower Extremity Assessment: Overall WFL for tasks assessed    Cervical / Trunk Assessment Cervical / Trunk Assessment: Normal  Communication   Communication: No difficulties  Cognition Arousal/Alertness: Awake/alert Behavior During Therapy: WFL for tasks assessed/performed Overall Cognitive Status: Within Functional Limits for tasks assessed                                 General Comments: Pt able to demonstrate divided attention and carry on conversations without reorientation required        General Comments General comments (skin integrity, edema, etc.): VSS during mobility, to d/c with mobility, educated on LE stretching but not formally assessed    Exercises     Assessment/Plan    PT Assessment Patient does not need any further PT services  PT Problem List         PT Treatment Interventions      PT Goals (Current goals can be found in the Care Plan section)  Acute Rehab PT Goals Patient Stated Goal: get back home and working in the garage PT Goal Formulation: With patient Time For Goal Achievement: 06/04/22 Potential to Achieve Goals: Good    Frequency       Co-evaluation               AM-PAC PT "6 Clicks" Mobility  Outcome Measure Help needed turning from your back to your side while in a flat bed without using bedrails?: None Help needed moving from lying on your back to sitting on the side of a flat bed without using bedrails?: None Help needed moving to and from a bed to a chair (including a wheelchair)?: A Little Help needed standing up from a chair using your arms (e.g., wheelchair or bedside chair)?: A Little Help needed to walk in hospital room?: A Little Help needed climbing 3-5 steps with a railing? : A Lot 6 Click Score: 19    End of Session Equipment Utilized During  Treatment: Gait belt Activity Tolerance: Patient tolerated treatment well Patient left: in bed;with call bell/phone within reach Nurse Communication: Mobility status PT Visit Diagnosis: Other abnormalities of gait and mobility (R26.89);Unsteadiness on feet (R26.81)    Time: 2952-8413 PT Time Calculation (min) (ACUTE ONLY): 27 min   Charges:   PT Evaluation $PT Eval Moderate Complexity: 1 Mod PT Treatments $Gait Training: 8-22 mins       Chipper Oman, SPT   [place] Licet Dunphy 05/21/2022, 1:53 PM

## 2022-05-21 NOTE — Progress Notes (Signed)
PT Cancellation Note  Patient Details Name: [name] [name] MRN: [id number] DOB: 1946-04-28   Cancelled Treatment:    Reason Eval/Treat Not Completed: Patient at procedure or test/unavailable (currently with OT, will reattempt)   Claiborne Stroble B Damaso Laday 05/21/2022, 8:48 AM [place] Office: [phone]

## 2022-05-21 NOTE — Evaluation (Signed)
Occupational Therapy Evaluation Patient Details Name: [name] [name] MRN: [id number] DOB: 1946-06-06 Today's Date: 05/21/2022   History of Present Illness Pt is a 76 y/o M admitted on 05/20/22 for R above-knee popliteal/femoral artery  BPG. PMH of CHF, CAD (s/p CABG), HLD, cardiomyopathy, OA, PVD, vertigo, hypothyroidism. lymphoma   Clinical Impression   PTA pt independent with self care and mobility, however he states that since having Chemo he has been very weak and had 1 fall 2 months ago.  Feels his strength has improved significantly since ending chemo @ 7 weeks ago. Mobilizing well with min guard assist at RW level and requires min A for LB ADL due to R thigh pain. Wife will be able to assist at DC. Acute OT will follow to facilitate safe DC home however do not anticipate the need for HHOT. VSS during session. Pt very appreciative.      Recommendations for follow up therapy are one component of a multi-disciplinary discharge planning process, led by the attending physician.  Recommendations may be updated based on patient status, additional functional criteria and insurance authorization.   Follow Up Recommendations  No OT follow up    Assistance Recommended at Discharge Intermittent Supervision/Assistance  Patient can return home with the following A little help with bathing/dressing/bathroom;Assistance with cooking/housework    Functional Status Assessment  Patient has had a recent decline in their functional status and demonstrates the ability to make significant improvements in function in a reasonable and predictable amount of time.  Equipment Recommendations  Tub/shower seat    Recommendations for Other Services PT consult     Precautions / Restrictions Precautions Precautions: Fall      Mobility Bed Mobility Overal bed mobility: Modified Independent                  Transfers Overall transfer level: Needs assistance Equipment used: Rolling walker (2  wheels) Transfers: Sit to/from Stand Sit to Stand: Supervision                  Balance Overall balance assessment: Mild deficits observed, not formally tested, History of Falls (1 fall 2 onths ago off front porch without injury)                                         ADL either performed or assessed with clinical judgement   ADL Overall ADL's : Needs assistance/impaired     Grooming: Set up   Upper Body Bathing: Set up   Lower Body Bathing: Minimal assistance   Upper Body Dressing : Set up   Lower Body Dressing: Minimal assistance   Toilet Transfer: Min guard;Rolling walker (2 wheels);Ambulation   Toileting- Clothing Manipulation and Hygiene: Set up       Functional mobility during ADLs: Min guard;Rolling walker (2 wheels) General ADL Comments: limited     Vision Baseline Vision/History: 1 Wears glasses Vision Assessment?: No apparent visual deficits     Perception     Praxis      Pertinent Vitals/Pain Pain Assessment Pain Assessment: No/denies pain     Hand Dominance Right   Extremity/Trunk Assessment Upper Extremity Assessment Upper Extremity Assessment: Overall WFL for tasks assessed   Lower Extremity Assessment Lower Extremity Assessment: Defer to PT evaluation (c/o numbness R toes/heel)   Cervical / Trunk Assessment Cervical / Trunk Assessment: Normal   Communication Communication Communication: No difficulties  Cognition Arousal/Alertness: Awake/alert Behavior During Therapy: WFL for tasks assessed/performed Overall Cognitive Status: Within Functional Limits for tasks assessed                                       General Comments       Exercises     Shoulder Instructions      Home Living Family/patient expects to be discharged to:: Private residence Living Arrangements: Spouse/significant other Available Help at Discharge: Available 24 hours/day Type of Home: Mobile home Home Access:  Stairs to enter Entrance Stairs-Number of Steps: 3 Entrance Stairs-Rails: None Home Layout: One level     Bathroom Shower/Tub: Occupational psychologist: Handicapped height Bathroom Accessibility: Yes How Accessible: Accessible via walker Home Equipment:  (Doesn't think he has anything)          Prior Functioning/Environment Prior Level of Function : Independent/Modified Independent;Driving               ADLs Comments:  (was "very weak" due to the chemo and hiring out people to [place])        OT Problem List: Decreased range of motion;Impaired balance (sitting and/or standing);Decreased strength;Pain      OT Treatment/Interventions: Self-care/ADL training;DME and/or AE instruction;Therapeutic activities;Patient/family education    OT Goals(Current goals can be found in the care plan section) Acute Rehab OT Goals Patient Stated Goal: to go home to his [facility] OT Goal Formulation: With patient Time For Goal Achievement: 06/04/22 Potential to Achieve Goals: Good  OT Frequency: Min 2X/week    Co-evaluation              AM-PAC OT "6 Clicks" Daily Activity     Outcome Measure Help from another person eating meals?: None Help from another person taking care of personal grooming?: A Little Help from another person toileting, which includes using toliet, bedpan, or urinal?: A Little Help from another person bathing (including washing, rinsing, drying)?: A Little Help from another person to put on and taking off regular upper body clothing?: A Little Help from another person to put on and taking off regular lower body clothing?: A Little 6 Click Score: 19   End of Session Equipment Utilized During Treatment: Gait belt;Rolling walker (2 wheels) Nurse Communication: Mobility status  Activity Tolerance: Patient tolerated treatment well Patient left: in chair;with call bell/phone within reach  OT Visit Diagnosis: Unsteadiness on feet (R26.81);Pain Pain -  Right/Left: Right Pain - part of body:  (thigh)                Time: 3546-5681 OT Time Calculation (min): 36 min Charges:  OT General Charges $OT Visit: 1 Visit OT Evaluation $OT Eval Moderate Complexity: 1 Mod OT Treatments $Self Care/Home Management : 8-22 mins  Maurie Boettcher, OT/L   Acute OT Clinical Specialist [place] Pager [phone] Office [phone]   [facility] 05/21/2022, 9:18 AM

## 2022-05-21 NOTE — Progress Notes (Addendum)
[facility]  Subjective  - Doing well over all   Objective (!) 96/56 61 97.7 F (36.5 C) (Oral) 19 92%  Intake/Output Summary (Last 24 hours) at 05/21/2022 7416 Last data filed at 05/21/2022 0435 Gross per 24 hour  Intake 2950.73 ml  Output 1830 ml  Net 1120.73 ml    Incisions healing well without hematoma Brisk peroneal doppler, intact DP/PT right LE Tolerating PO;s Voiding General no acute distress   Assessment/Planning: POD # 1 Procedure: 1.  Harvest of right leg great saphenous vein 2.  Right above-knee popliteal artery to tibioperoneal trunk/posterior tibial artery bypass with ipsilateral reversed great saphenous vein 3.  Ligation of right popliteal artery aneurysm 4.  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   HGB decrease from 10.5 to 9 post op blood loss anemia  EBL 900 cc intraoperative Pending PT/OT and pain control for discharge planning  Roxy Horseman 05/21/2022 7:02 AM --  Laboratory Lab Results: Recent Labs    05/20/22 0706 05/20/22 1245 05/21/22 0422  WBC 6.0  --  6.6  HGB 13.4 10.5* 9.0*  HCT 40.8 31.0* 26.6*  PLT 109*  --  85*   BMET Recent Labs    05/20/22 0706 05/20/22 1245 05/21/22 0422  NA 139 137 134*  K 4.0 4.2 4.1  CL 102  --  103  CO2 28  --  22  GLUCOSE 117*  --  163*  BUN 21  --  21  CREATININE 1.15  --  1.12  CALCIUM 9.2  --  8.4*    COAG Lab Results  Component Value Date   INR 1.1 05/20/2022   INR 1.2 05/14/2022   INR 2.5 (H) 05/07/2022   No results found for: "PTT"  I have seen and evaluated the patient. I agree with the PA note as documented above.  Postop day 1 status post right above-knee popliteal artery to tibioperoneal trunk bypass with ipsilateral reversed great saphenous vein with ligation of popliteal artery aneurysm that was 4.7 cm.   Brisk Doppler signals in the right foot and this is well-perfused.  Motor and sensory intact.  Incisions look good.  Out of bed and mobilize today.  Foley removed last night.  Aspirin ordered.  [name], MD [facility] Office: [phone]

## 2022-05-22 LAB — CBC
HCT: 27.3 % — ABNORMAL LOW (ref 39.0–52.0)
Hemoglobin: 8.7 g/dL — ABNORMAL LOW (ref 13.0–17.0)
MCH: 30.5 pg (ref 26.0–34.0)
MCHC: 31.9 g/dL (ref 30.0–36.0)
MCV: 95.8 fL (ref 80.0–100.0)
Platelets: 88 10*3/uL — ABNORMAL LOW (ref 150–400)
RBC: 2.85 MIL/uL — ABNORMAL LOW (ref 4.22–5.81)
RDW: 14.7 % (ref 11.5–15.5)
WBC: 7.6 10*3/uL (ref 4.0–10.5)
nRBC: 0 % (ref 0.0–0.2)

## 2022-05-22 MED ORDER — OXYCODONE-ACETAMINOPHEN 5-325 MG PO TABS: 1.0000 | ORAL_TABLET | Freq: Four times a day (QID) | ORAL | 0 refills | Status: DC | PRN

## 2022-05-22 MED ORDER — ASPIRIN 81 MG PO TBEC: 81.0000 mg | DELAYED_RELEASE_TABLET | Freq: Every day | ORAL | 12 refills | Status: DC

## 2022-05-22 NOTE — Progress Notes (Signed)
05/22/2022 10:51 AM Discharge AVS meds taken today and those due this evening reviewed.  Follow-up appointments and when to call md reviewed.  D/C IV and TELE.  Questions and concerns addressed.   D/C home per orders.  Carney Corners

## 2022-05-22 NOTE — Progress Notes (Signed)
Occupational Therapy Treatment Patient Details Name: [name] [name] MRN: [id number] DOB: [birth year] Today's Date: 05/22/2022   History of present illness Pt is a 76 y/o M admitted on 05/20/22 for R above-knee popliteal/femoral artery  BPG. PMH of CHF, CAD (s/p CABG), HLD, cardiomyopathy, OA, PVD, vertigo, hypothyroidism. lymphoma   OT comments  Patient received in supine and had been informed he has orders for discharge. Patient able to get to EOB without assistance and stood at sink for grooming. Patient performed toilet transfer and shower transfer with supervision and education on safety. Patient performed dressing seated and standing from recliner with setup and supervision when standing. Patient provided handout for fall prevention and reviewed with patient. Patient is expected to discharge home today.    Recommendations for follow up therapy are one component of a multi-disciplinary discharge planning process, led by the attending physician.  Recommendations may be updated based on patient status, additional functional criteria and insurance authorization.    Follow Up Recommendations  No OT follow up    Assistance Recommended at Discharge Intermittent Supervision/Assistance  Patient can return home with the following  Assistance with cooking/housework   Equipment Recommendations  Tub/shower seat    Recommendations for Other Services      Precautions / Restrictions Precautions Precautions: Fall Restrictions Weight Bearing Restrictions: No       Mobility Bed Mobility Overal bed mobility: Modified Independent Bed Mobility: Supine to Sit     Supine to sit: Modified independent (Device/Increase time), HOB elevated     General bed mobility comments: no assistance to get to EOB    Transfers Overall transfer level: Needs assistance Equipment used: Rolling walker (2 wheels) Transfers: Sit to/from Stand Sit to Stand: Supervision           General transfer  comment: supervision for safety     Balance Overall balance assessment: Needs assistance Sitting-balance support: No upper extremity supported, Feet supported, Feet unsupported Sitting balance-Leahy Scale: Good     Standing balance support: Bilateral upper extremity supported, No upper extremity supported, During functional activity Standing balance-Leahy Scale: Good Standing balance comment: able to stand at sink without UE support                           ADL either performed or assessed with clinical judgement   ADL Overall ADL's : Needs assistance/impaired     Grooming: Wash/dry hands;Wash/dry face;Oral care;Standing;Modified independent Grooming Details (indicate cue type and reason): stood at sink for grooming without assistance         Upper Body Dressing : Set up Upper Body Dressing Details (indicate cue type and reason): seated in recliner Lower Body Dressing: Supervision/safety;Sitting/lateral leans;Sit to/from stand Lower Body Dressing Details (indicate cue type and reason): donned underwear, pants and socks seated and standing Toilet Transfer: Supervision/safety;Regular Toilet;Rolling walker (2 wheels)       Tub/ Shower Transfer: Supervision/safety;Walk-in Lobbyist Details (indicate cue type and reason): supervision to perform transfer into walk in shower without the use of rails due to no rails in home shower   General ADL Comments: supervision to MI for self care tasks and functional transfers    Extremity/Trunk Assessment              Vision       Perception     Praxis      Cognition Arousal/Alertness: Awake/alert Behavior During Therapy: WFL for tasks assessed/performed Overall Cognitive Status: Within Functional Limits for tasks  assessed                                 General Comments: eager to return home        Exercises      Shoulder Instructions       General Comments Provided  patient with fall prevention handout and reviewed with patient    Pertinent Vitals/ Pain       Pain Assessment Pain Assessment: Faces Faces Pain Scale: Hurts a little bit Pain Location: R calf/ankle Pain Descriptors / Indicators: Grimacing, Guarding Pain Intervention(s): Limited activity within patient's tolerance, Monitored during session, Repositioned  Home Living                                          Prior Functioning/Environment              Frequency  Min 2X/week        Progress Toward Goals  OT Goals(current goals can now be found in the care plan section)  Progress towards OT goals: Progressing toward goals  Acute Rehab OT Goals Patient Stated Goal: go  home OT Goal Formulation: With patient Time For Goal Achievement: 06/04/22 Potential to Achieve Goals: Good ADL Goals Pt Will Perform Lower Body Bathing: with modified independence;sit to/from stand Pt Will Perform Lower Body Dressing: with modified independence;sit to/from stand Pt Will Transfer to Toilet: with modified independence;ambulating Pt Will Perform Tub/Shower Transfer: Shower transfer;with modified independence;rolling walker  Plan Discharge plan remains appropriate    Co-evaluation                 AM-PAC OT "6 Clicks" Daily Activity     Outcome Measure   Help from another person eating meals?: None Help from another person taking care of personal grooming?: A Little Help from another person toileting, which includes using toliet, bedpan, or urinal?: A Little Help from another person bathing (including washing, rinsing, drying)?: A Little Help from another person to put on and taking off regular upper body clothing?: A Little Help from another person to put on and taking off regular lower body clothing?: A Little 6 Click Score: 19    End of Session Equipment Utilized During Treatment: Rolling walker (2 wheels)  OT Visit Diagnosis: Unsteadiness on feet  (R26.81);Pain Pain - Right/Left: Right Pain - part of body: Leg   Activity Tolerance Patient tolerated treatment well   Patient Left in chair;with call bell/phone within reach   Nurse Communication Mobility status        Time: 1610-9604 OT Time Calculation (min): 20 min  Charges: OT General Charges $OT Visit: 1 Visit OT Treatments $Self Care/Home Management : 8-22 mins  Lodema Hong, [place]  Office [place] 05/22/2022, 2:16 PM

## 2022-05-22 NOTE — Progress Notes (Signed)
[facility]  Subjective  -no complaints.  Not having any pain.   Objective 102/67 68 97.9 F (36.6 C) (Oral) 18 100%  Intake/Output Summary (Last 24 hours) at 05/22/2022 0754 Last data filed at 05/22/2022 0344 Gross per 24 hour  Intake 240 ml  Output 640 ml  Net -400 ml    Brisk right PT, peroneal and dorsalis pedis signal Right leg incisions clean dry and intact  Laboratory Lab Results: Recent Labs    05/21/22 0422 05/22/22 0204  WBC 6.6 7.6  HGB 9.0* 8.7*  HCT 26.6* 27.3*  PLT 85* 88*   BMET Recent Labs    05/20/22 0706 05/20/22 1245 05/21/22 0422  NA 139 137 134*  K 4.0 4.2 4.1  CL 102  --  103  CO2 28  --  22  GLUCOSE 117*  --  163*  BUN 21  --  21  CREATININE 1.15  --  1.12  CALCIUM 9.2  --  8.4*    COAG Lab Results  Component Value Date   INR 1.1 05/20/2022   INR 1.2 05/14/2022   INR 2.5 (H) 05/07/2022   No results found for: "PTT"  Assessment/Planning:  76 year old male postop day 2 status post right above-knee popliteal artery to tibioperoneal trunk bypass with ligation of the popliteal artery for a 4.7 cm popliteal artery aneurysm.  Brisk Doppler signals in the foot.  All of incisions look great.  Plan discharge today.  PT has signed off.  Discussed aspirin.  Can resume his Coumadin at home.  We will arrange incision checks in 2 to 3 weeks.  [name] 05/22/2022 7:54 AM --

## 2022-05-25 ENCOUNTER — Telehealth: Payer: Self-pay | Admitting: Cardiology

## 2022-05-25 NOTE — Discharge Summary (Signed)
[facility]ypass Discharge Summary Patient ID: [name] [name] [id number] 76 y.o. 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  He was sent to me for evaluation of possible stent graft but due to the significant mismatch between the proximal and distal seal zones I did not feel he was a good candidate for stent graft repair of his popliteal aneurysm.  He presents today for right lower extremity bypass and exclusion of popliteal artery aneurysm after risks and benefits discussed.  An assistant was needed for exposure and to expedite the case and for sewing the bypass anastomosis.  Hospital Course: [name] [name] was admitted on 05/20/22 and underwent 1. Harvest of right leg great saphenous vein 2. Right above-knee popliteal artery to tibioperoneal trunk/posterior tibial artery bypass with ipsilateral reversed great saphenous vein 3.  Ligation of right popliteal artery aneurysm 4.  Ligation of right anterior tibial artery by [name] [name]. He tolerated the procedure well and was taken to the recovery room in stable condition.   POD #1, he did well overnight. Right lower extremity remained well perfused and warm with brisk doppler signals. Pain well controlled. Post op blood loss anemia but remained asymptomatic. He remained hemodynamically stable. Cleared by PT and OT. Tolerating diet. Ambulating well.   By POD#2, continued to have excellent perfusion of right lower extremity with brisk PT/ Peroneal and DP signals. Incisions well appearing. Pain  well controlled. Remained hemodynamically stable. Remained stable for discharge home. He will continue Aspirin and resume his home coumadin dose. PDMP was reviewed and post operative pain medication was sent to patients pharmacy. He will follow up in 2-3 weeks for incision check.    Consults: None  Treatments: analgesia: Aspirin, Oxycodone-Acetaminophen, therapies: PT and OT, and surgery: 1.  Harvest of right leg great saphenous vein 2.  Right above-knee popliteal artery to tibioperoneal trunk/posterior tibial artery bypass with ipsilateral reversed great saphenous vein 3.  Ligation of right popliteal artery aneurysm 4.  Ligation of right anterior tibial artery   Disposition: Discharge disposition: 01-Home or Self Care       - For [name] [name] LP Registry use ---  Post-op:  Wound infection: No  Graft infection: No  Transfusion: No  If yes, 0 units given New Arrhythmia: No Patency judged by: [ X] Dopper only, [ ]  Palpable graft pulse, [ ]  Palpable distal pulse, [ ]  ABI inc. > 0.15, [ ]  Duplex D/C Ambulatory Status: Ambulatory  Complications: MI: Arly.Keller ] No, [ ]  Troponin only, [ ]  EKG or Clinical CHF: No Resp failure: Arly.Keller ] none, [ ]  Pneumonia, [ ]  Ventilator Chg in renal function: [ X] none, [ ]  Inc. Cr > 0.5, [ ]  Temp. Dialysis, [ ]  Permanent dialysis Stroke: [ X] None, [ ]  Minor, [ ]  Major Return to OR: No  Reason for return to OR: [ ]  Bleeding, [ ]  Infection, [ ]  Thrombosis, [ ]  Revision  Discharge medications: Statin use:  Yes ASA use:  Yes Plavix use:  No  for medical reason not indicated Beta blocker use: Yes Coumadin use: Yes    Patient Instructions:  Allergies  as of 05/22/2022       Reactions   Ace Inhibitors Cough   Statins Other (See Comments)   Muscle pains        Medication List     TAKE these medications    ARTIFICIAL TEAR SOLUTION OP Place 1 drop into both eyes daily as needed (dry eyes).   aspirin EC 81 MG tablet Take 1 tablet (81 mg total) by mouth daily  at 6 (six) AM. Swallow whole.   b complex vitamins capsule Take 1 capsule by mouth daily.   ezetimibe 10 MG tablet Commonly known as: ZETIA TAKE 1 TABLET EVERY DAY   feeding supplement Liqd Take 237 mLs by mouth 3 (three) times daily between meals. What changed: when to take this   furosemide 20 MG tablet Commonly known as: LASIX Take 1 tablet (20 mg total) by mouth daily.   Jardiance 10 MG Tabs tablet Generic drug: empagliflozin Take 10 mg by mouth daily.   lidocaine-prilocaine cream Commonly known as: EMLA Apply a small amount to port a cath site (do not rub in) and cover with plastic wrap 1 hour prior to infusion appointments   meloxicam 15 MG tablet Commonly known as: MOBIC Take 15 mg by mouth daily.   metoprolol succinate 50 MG 24 hr tablet Commonly known as: TOPROL-XL Take 50 mg by mouth daily.   multivitamin with minerals tablet Take 1 tablet by mouth daily. Centrum silver   oxyCODONE-acetaminophen 5-325 MG tablet Commonly known as: PERCOCET/ROXICET Take 1 tablet by mouth every 6 (six) hours as needed for moderate pain.   predniSONE 20 MG tablet Commonly known as: DELTASONE Take 5 tablets (100 mg total) by mouth daily with breakfast. Take 100 mg by mouth daily w/food What changed:  when to take this reasons to take this   rosuvastatin 5 MG tablet Commonly known as: CRESTOR TAKE 1 TABLET EVERY OTHER DAY   spironolactone 25 MG tablet Commonly known as: ALDACTONE TAKE 1/2 TABLET EVERY DAY   triamcinolone 55 MCG/ACT Aero nasal inhaler Commonly known as: NASACORT Place 2 sprays into the nose daily. What changed:  when to take this reasons to take this   VITA-C PO Take 1,000 mg by mouth daily.   warfarin 5 MG tablet Commonly known as: COUMADIN Take as directed. If you are unsure how to take this medication, talk to your nurse or doctor. Original instructions: Take 5 mg by mouth daily. pm               Discharge Care Instructions  (From  admission, onward)           Start     Ordered   05/22/22 0000  Discharge wound care:       Comments: Wash with mild soap and water, pat dry. Do not soak in bathtub   05/22/22 0818           Activity: activity as tolerated, no driving while on analgesics, and no heavy lifting for 6 weeks Diet: low fat, low cholesterol diet Wound Care:  wash incisions daily with mild soap and water, pat dry. Do not soak in bathtub  Follow-up with [name] [name] in  2-3  weeks.  Signed: Graceann Congress 05/25/2022 7:57 AM

## 2022-05-25 NOTE — Telephone Encounter (Signed)
Pt c/o medication issue:  1. Name of Medication: meloxicam (MOBIC) 15 MG tablet warfarin (COUMADIN) 5 MG tablet  2. How are you currently taking this medication (dosage and times per day)? As prescribed   3. Are you having a reaction (difficulty breathing--STAT)? No   4. What is your medication issue? Christy a home health nurse with [facility] is calling to report these two medications show up as a level 2 flag for interaction. She states patient has been on both for awhile with issues, but she is required to report this.

## 2022-05-25 NOTE — Telephone Encounter (Signed)
Will route to Dr [name] for review

## 2022-05-26 NOTE — Telephone Encounter (Signed)
Spoke with pt, Aware of dr crenshaw's recommendations.  °

## 2022-05-27 ENCOUNTER — Ambulatory Visit (INDEPENDENT_AMBULATORY_CARE_PROVIDER_SITE_OTHER): Payer: Medicare HMO

## 2022-05-27 DIAGNOSIS — Z95810 Presence of automatic (implantable) cardiac defibrillator: Secondary | ICD-10-CM | POA: Diagnosis not present

## 2022-05-27 DIAGNOSIS — I5042 Chronic combined systolic (congestive) and diastolic (congestive) heart failure: Secondary | ICD-10-CM

## 2022-05-27 NOTE — Progress Notes (Signed)
EPIC Encounter for ICM Monitoring  Patient Name: [name] [name] is a 76 y.o. male Date: 05/27/2022 Primary Care Physican: Biagio Borg, MD Primary Cardiologist: [name] Electrophysiologist: Mealor 04/20/2022 Not weighing at home 05/07/2022 Weight: 181 lbs (does not weigh consistently at home)                                                           Spoke with patient and heart failure questions reviewed.  Transmission results reviewed.  Pt remains asymptomatic for fluid accumulation and reviewed symptoms he should report.  He had surgery for aneurysm located in the back of the knee.    DIET:  Pt uses salt shaker at home and does not follow low salt diet.  Also eats restaurant foods.    Optivol thoracic impedance suggesting possible fluid accumulation continues since 9/6 but starting to improve since 10/11.     Prescribed: Furosemide 20 mg take 1 tablet(s) by mouth daily   Spironolactone 25 mg take 1 tablet by mouth daily Jardiance 10 mg take 1 tablet by mouth daily   Labs: 05/12/2022 Creatinine 1.04, BUN 24, Potassium 3.8, Sodium 138, GFR >60 05/07/2022 Creatinine 1.10, BUN 20, Potassium 3.7, Sodium 139  03/31/2022 Creatinine 1.08, BUN 18, Potassium 3.5, Sodium 137, GFR >60  03/18/2022 Creatinine 1.04, BUN 26, Potassium 4.7, Sodium 136, GFR >60  03/10/2022 Creatinine 1.16, BUN 26, Potassium 3.9, Sodium 135, GFR >60 02/26/2022 Creatinine 1.16, BUN 24, Potassium 4.2, Sodium 135, GFR >60  02/17/2022 Creatinine 1.61, BUN 38, Potassium 4.5, Sodium 135, GFR 44 01/27/2022 Creatinine 1.33, BUN 24, Potassium 4.2, Sodium 134, GFR 56 A complete set of results can be found in Results Review.   Recommendations:  Advised to take 40 mg Furosemide x 2 days.  Advised to limit salt to 2000 mg daily.  Will call back if Dr [name] has any recommendations.     Copy sent to Dr [name] for review and recommendations if needed.    Follow-up plan: ICM clinic phone appointment on 06/01/2022 to  recheck fluid levels.   91 day device clinic remote transmission 07/28/2022.     EP/Cardiology Office Visits: Canceled 05/20/2022 OV with Coletta Memos, NP.  Recall 06/14/2022 with Oda Kilts, PA.   Copy of ICM check sent to Dr. Myles Gip.       3 month ICM trend: 05/27/2022.    12-14 Month ICM trend:     Rosalene Billings, RN 05/27/2022 3:56 PM

## 2022-06-01 ENCOUNTER — Ambulatory Visit (INDEPENDENT_AMBULATORY_CARE_PROVIDER_SITE_OTHER): Payer: Medicare HMO

## 2022-06-01 ENCOUNTER — Ambulatory Visit: Payer: No Typology Code available for payment source | Attending: Cardiology | Admitting: *Deleted

## 2022-06-01 DIAGNOSIS — I240 Acute coronary thrombosis not resulting in myocardial infarction: Secondary | ICD-10-CM

## 2022-06-01 DIAGNOSIS — Z95810 Presence of automatic (implantable) cardiac defibrillator: Secondary | ICD-10-CM

## 2022-06-01 DIAGNOSIS — I5042 Chronic combined systolic (congestive) and diastolic (congestive) heart failure: Secondary | ICD-10-CM

## 2022-06-01 DIAGNOSIS — Z5181 Encounter for therapeutic drug level monitoring: Secondary | ICD-10-CM

## 2022-06-01 LAB — POCT INR: INR: 1.6 — AB (ref 2.0–3.0)

## 2022-06-01 NOTE — Patient Instructions (Signed)
Pt started chemo 12/15/2021 Pt takes Prednisone 100 mg on the first 5 days of chemo treatment.  Take warfarin 2 tablets tonight and tomorrow night then resume 1 tablet daily except 1 1/2 tablets on Mondays  Recheck in 2 wk

## 2022-06-01 NOTE — Progress Notes (Signed)
EPIC Encounter for ICM Monitoring  Patient Name: [name] [name] is a 76 y.o. male Date: 06/01/2022 Primary Care Physican: Biagio Borg, MD Primary Cardiologist: [name] Electrophysiologist: Mealor 04/20/2022 Not weighing at home 05/07/2022 Weight: 181 lbs (does not weigh consistently at home)                                                           Spoke with patient and heart failure questions reviewed.  Transmission results reviewed.  Pt remains asymptomatic for fluid accumulation.  He did try to cut back on salt intake.     DIET:  Pt uses salt shaker at home and does not follow low salt diet.  Also eats restaurant foods.     Optivol thoracic impedance suggesting fluid levels returned close to normal after taking Furosemide 40 mg x 2 days.     Prescribed: Furosemide 20 mg take 1 tablet(s) by mouth daily   Spironolactone 25 mg take 1 tablet by mouth daily Jardiance 10 mg take 1 tablet by mouth daily   Labs: 05/12/2022 Creatinine 1.04, BUN 24, Potassium 3.8, Sodium 138, GFR >60 05/07/2022 Creatinine 1.10, BUN 20, Potassium 3.7, Sodium 139  03/31/2022 Creatinine 1.08, BUN 18, Potassium 3.5, Sodium 137, GFR >60  03/18/2022 Creatinine 1.04, BUN 26, Potassium 4.7, Sodium 136, GFR >60  03/10/2022 Creatinine 1.16, BUN 26, Potassium 3.9, Sodium 135, GFR >60 02/26/2022 Creatinine 1.16, BUN 24, Potassium 4.2, Sodium 135, GFR >60  02/17/2022 Creatinine 1.61, BUN 38, Potassium 4.5, Sodium 135, GFR 44 01/27/2022 Creatinine 1.33, BUN 24, Potassium 4.2, Sodium 134, GFR 56 A complete set of results can be found in Results Review.   Recommendations:  Recommendation to limit salt intake to 2000 mg daily and fluid intake to 64 oz daily.  Encouraged to call if experiencing any fluid symptoms.      Follow-up plan: ICM clinic phone appointment on 06/29/2022.   91 day device clinic remote transmission 07/28/2022.     EP/Cardiology Office Visits: Canceled 05/20/2022 OV with Coletta Memos, NP.   Recall 06/14/2022 with Oda Kilts, PA.   Copy of ICM check sent to Dr. Myles Gip.        3 month ICM trend: 06/01/2022.    12-14 Month ICM trend:     Rosalene Billings, RN 06/01/2022 4:27 PM

## 2022-06-09 ENCOUNTER — Ambulatory Visit (INDEPENDENT_AMBULATORY_CARE_PROVIDER_SITE_OTHER): Payer: No Typology Code available for payment source | Admitting: Physician Assistant

## 2022-06-09 VITALS — BP 131/75 | HR 49 | Temp 97.7°F | Resp 20 | Ht 71.0 in | Wt 187.3 lb

## 2022-06-09 DIAGNOSIS — I724 Aneurysm of artery of lower extremity: Secondary | ICD-10-CM

## 2022-06-09 MED ORDER — GABAPENTIN 100 MG PO CAPS: 100.0000 mg | ORAL_CAPSULE | Freq: Two times a day (BID) | ORAL | 0 refills | Status: DC | PRN

## 2022-06-09 NOTE — Progress Notes (Signed)
POST OPERATIVE OFFICE NOTE    CC:  F/u for surgery  HPI: [name] [name] is a 76 y.o. male who is s/p right above-knee popliteal artery to TP trunk bypass with ipsilateral reversed GSV, ligation of right popliteal artery aneurysm, and ligation of right anterior tibial artery on 05/20/2022 by Dr. Carlis Abbott.  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  Pt returns today for follow up.  Pt states his incisions have been healing well.  He denies any redness or drainage from the incisions.  He endorses some right lower extremity swelling since his surgery.  He also reports intermittent, daily "shooting" pains that originate from his right knee to his right foot.  These pains have been present since a couple days after the surgery.  So far, the pains have not gotten any better with time.  His right lower leg is also very sensitive to touch and causes his leg to jerk even if it grazes something.  He denies any claudication or rest pain.   Allergies  Allergen Reactions   Ace Inhibitors Cough   Statins Other (See Comments)    Muscle pains    Current Outpatient Medications  Medication Sig Dispense Refill   ARTIFICIAL TEAR SOLUTION OP Place 1 drop into both eyes daily as needed (dry eyes).     Ascorbic Acid (VITA-C PO) Take 1,000 mg by mouth daily.     aspirin EC 81 MG tablet Take 1 tablet (81 mg total) by mouth daily at 6 (six) AM. Swallow whole. 30 tablet 12   b complex vitamins capsule Take 1 capsule by mouth daily.     empagliflozin (JARDIANCE) 10 MG TABS tablet Take 10 mg by mouth daily.     ezetimibe (ZETIA) 10 MG tablet TAKE 1 TABLET EVERY DAY (Patient taking differently: Take 10 mg by mouth daily.) 90 tablet 3   feeding supplement (ENSURE ENLIVE / ENSURE PLUS) LIQD Take 237 mLs by mouth 3 (three) times daily between meals. (Patient taking differently: Take 237 mLs by mouth 2 (two) times daily between meals.) 237 mL 12   furosemide (LASIX) 20 MG tablet Take 1  tablet (20 mg total) by mouth daily. 90 tablet 3   lidocaine-prilocaine (EMLA) cream Apply a small amount to port a cath site (do not rub in) and cover with plastic wrap 1 hour prior to infusion appointments 30 g 3   meloxicam (MOBIC) 15 MG tablet Take 15 mg by mouth daily.     metoprolol succinate (TOPROL-XL) 50 MG 24 hr tablet Take 50 mg by mouth daily.     Multiple Vitamins-Minerals (MULTIVITAMIN WITH MINERALS) tablet Take 1 tablet by mouth daily. Centrum silver     oxyCODONE-acetaminophen (PERCOCET/ROXICET) 5-325 MG tablet Take 1 tablet by mouth every 6 (six) hours as needed for moderate pain. 20 tablet 0   predniSONE (DELTASONE) 20 MG tablet Take 5 tablets (100 mg total) by mouth daily with breakfast. Take 100 mg by mouth daily w/food (Patient taking differently: Take 100 mg by mouth daily as needed (Before Chemo). Take 100 mg by mouth daily w/food) 15 tablet 0   rosuvastatin (CRESTOR) 5 MG tablet TAKE 1 TABLET EVERY OTHER DAY 45 tablet 10   spironolactone (ALDACTONE) 25 MG tablet TAKE 1/2 TABLET EVERY DAY 45 tablet 3   triamcinolone (NASACORT) 55 MCG/ACT AERO nasal inhaler Place 2 sprays into the nose daily. (Patient taking differently: Place 2 sprays into the nose daily as needed (allergies).) 3 Inhaler 3  warfarin (COUMADIN) 5 MG tablet Take 5 mg by mouth daily. pm     No current facility-administered medications for this visit.     ROS:  See HPI  Physical Exam:  Incision:  RLE incisions nearly healed.  No drainage or erythema  Extremities: Right lower extremity well-perfused.  1+ edema in right lower extremity. Brisk R DP/Peroneal/PT doppler signals Neuro: intact motor and sensation of RLE. Right calf very sensitive to touch    Assessment/Plan:  This is a 76 y.o. male who is s/p: right above-knee popliteal artery to TP trunk bypass with ipsilateral reversed GSV, ligation of right popliteal artery aneurysm, and ligation of right anterior tibial artery on 05/20/2022 by Dr.  Carlis Abbott.   -The patient's incisions are nearly healed in the RLE. There is no evidence of infection -He does have some RLE swelling since the surgery, likely due to reperfusion swelling. I have suggested the patient elevate his leg as needed to help with swelling -He has brisk R PT/Peroneal/DP doppler signals. He is without rest pain, claudication, or ulcers -The patient's R calf since surgery has been very sensitive to touch. He is also experiencing RLE shooting pains from his knee to foot that are intermittent in nature. This is likely due to nerve irritation from surgery and I expect it to hopefully Improve with time. I will prescribe him short course of gabapentin to take as needed for nerve pain. -He can follow up with [place] in 2-3 weeks with ABIs and RLE bypass graft duplex   Vicente Serene, PA-C [facility] [phone]   Clinic MD:  Carlis Abbott

## 2022-06-11 ENCOUNTER — Other Ambulatory Visit: Payer: Self-pay

## 2022-06-11 ENCOUNTER — Telehealth: Payer: Self-pay | Admitting: *Deleted

## 2022-06-11 DIAGNOSIS — I724 Aneurysm of artery of lower extremity: Secondary | ICD-10-CM

## 2022-06-11 NOTE — Telephone Encounter (Signed)
Patient called to advise that he is continuing to have moderate nipple pain.  Has not had treatment for 7 weeks, therefore not likely coming from anything he has received for his lymphoma.  Consulted with Dr. Delton Coombes, who suggested that it could be coming from Aldactone.  Advised that he contact Dr. [name] to discuss other alternatives.  Verbalized understanding.

## 2022-06-12 ENCOUNTER — Encounter: Payer: Self-pay | Admitting: Cardiology

## 2022-06-12 NOTE — Telephone Encounter (Signed)
Spoke to patient he stated he has had breast soreness for the past 2 months.Stated he thought it was due to chemo,but he stopped chemo 7 weeks ago and continues to have breast soreness.Stated he stopped taking Spironolactone this morning.Advised I will send message to [facility] for advice.

## 2022-06-12 NOTE — Telephone Encounter (Signed)
Spoke to patient Dr.Crenshaw advised ok to stop Spironolactone.

## 2022-06-15 ENCOUNTER — Ambulatory Visit: Payer: No Typology Code available for payment source | Attending: Cardiology | Admitting: *Deleted

## 2022-06-15 DIAGNOSIS — Z5181 Encounter for therapeutic drug level monitoring: Secondary | ICD-10-CM | POA: Diagnosis not present

## 2022-06-15 DIAGNOSIS — I240 Acute coronary thrombosis not resulting in myocardial infarction: Secondary | ICD-10-CM | POA: Diagnosis not present

## 2022-06-15 LAB — POCT INR: INR: 3.6 — AB (ref 2.0–3.0)

## 2022-06-15 NOTE — Patient Instructions (Signed)
Pt started chemo 12/15/2021. Finished chemo Pt takes Prednisone 100 mg on the first 5 days of chemo treatment.  Hold warfarin tonight then resume 1 tablet daily except 1 1/2 tablets on Mondays  Recheck in 3 wk

## 2022-06-18 ENCOUNTER — Telehealth: Payer: Self-pay | Admitting: Cardiology

## 2022-06-18 DIAGNOSIS — Z95810 Presence of automatic (implantable) cardiac defibrillator: Secondary | ICD-10-CM

## 2022-06-18 DIAGNOSIS — I5042 Chronic combined systolic (congestive) and diastolic (congestive) heart failure: Secondary | ICD-10-CM

## 2022-06-18 NOTE — Telephone Encounter (Signed)
Pt c/o medication issue:  1. Name of Medication: Spironolactone  2. How are you currently taking this medication (dosage and times per day)?   3. Are you having a reaction (difficulty breathing--STAT)?   4. What is your medication issue? -patient said he had stopped taking the Spironolactone as he was told by Dr [name], because his breasts were sore. - He took his last dose on last Wednesday(06-10-22). Patient says his breasts are still real sore since he stopped taking it

## 2022-06-18 NOTE — Telephone Encounter (Signed)
Spoke with patient regarding breast soreness. He has not been taking spironolactone for 1 week. Recommended that he give it another 3 weeks to see if the breast soreness subsides. Patient verbalized understanding.

## 2022-06-22 ENCOUNTER — Telehealth: Payer: Self-pay | Admitting: Internal Medicine

## 2022-06-22 NOTE — Telephone Encounter (Addendum)
Patient stated that as of 6:30 am this morning,the room spun when he tried to get out of bed. His arms and legs got heavy and he was sweating. No chest pain of SOB. This has never happened before. BP 125/71, P 69. Able to keep medication/water down. Recommended he contact PCP to get checked for vertigo. Please do a pace download. He has not felt the defibrillator go off.  Side note. Patient stated the breast soreness from spironolactone has decreased.

## 2022-06-22 NOTE — Telephone Encounter (Signed)
STAT if patient feels like he/she is going to faint   Are you dizzy now?  No, because patient is laying down.  Patient states dizziness is occurring with movement and whenever he tries to get up. He states this morning he woke up around 6:30 AM and has been unable to get out of his bed since. He mentions being off of Spironolactone the past few weeks and questions whether this is affecting him.  Do you feel faint or have you passed out?  No, but patient states he came close to passing out   Do you have any other symptoms?  Sweats (hot), cannot feel his hands  Have you checked your HR and BP (record if available)?  11/13: 125/71 69

## 2022-06-22 NOTE — Telephone Encounter (Addendum)
I reviewed patient's transmission.   The main thing to note of concern with his transmission is that he is in fluid volume overload.  He denies any SOB.  The only symptom is this significant dizziness that he is having but it is mostly with positional change, sounds possibly like vertigo but I am very concerned for his Optivol and Heart Logic scores.  His presenting rhythm is mildly tachycardic but normal 1:1 conduction/rhythm.  No VT or VF  No therapies or shocks  Spoke with Sharman Cheek, RN and she is going to follow up further with patient and Dr. [name].  Forwarding to Margarita Grizzle and Dr. [name], will cc: Dr. Myles Gip.   Presenting Rhythm:

## 2022-06-22 NOTE — Telephone Encounter (Signed)
Pt called to report dizziness and unable to get out of bed and walk around for fear he will fall. Pt called cardiologist and was told it is not related to his heart or BP and to call his PCP for inner ear medicine. Pt uses:   [id number] - [place], [address], [place] [address] Phone: [phone]  Fax: [phone]   Pt phone [phone]

## 2022-06-22 NOTE — Telephone Encounter (Signed)
Created duplicate encounter.

## 2022-06-23 ENCOUNTER — Other Ambulatory Visit: Payer: Self-pay | Admitting: Internal Medicine

## 2022-06-23 MED ORDER — MECLIZINE HCL 12.5 MG PO TABS: 12.5000 mg | ORAL_TABLET | Freq: Three times a day (TID) | ORAL | 1 refills | Status: DC | PRN

## 2022-06-23 MED ORDER — FUROSEMIDE 20 MG PO TABS: 40.0000 mg | ORAL_TABLET | Freq: Every day | ORAL | 3 refills | Status: DC

## 2022-06-23 NOTE — Telephone Encounter (Signed)
Please advise if he should come in before sending in inner ear meds per cardiologist request.

## 2022-06-23 NOTE — Telephone Encounter (Signed)
Ok I sent antivert to [facility]

## 2022-06-23 NOTE — Telephone Encounter (Addendum)
Spoke with patient and advised per Dr Jacalyn Lefevre recommendations to increase Lasix to 40 mg daily and BMET in one week.  He verbalized understanding and repeated instructions back correctly.  He requested script be send to mail order pharmacy.  He will have labs drawn on 11/21.  His dizziness is better.  He has been taking Lasix 40 mg for past 2-3 days and feel like it has been helping.  Advised to call back if condition changes.

## 2022-06-23 NOTE — Telephone Encounter (Signed)
[name], MD  to Cristopher Estimable, RN     06/22/22  6:16 PM  Increase lasix to 40 mg daily; bmet one week Kirk Ruths

## 2022-06-23 NOTE — Telephone Encounter (Signed)
Refill request for warfarin:  Last INR was 3.6 on 06/15/22 Next INR due on 07/06/22 LOV was 919/23  A Mealor MD  Refill approved.

## 2022-06-23 NOTE — Telephone Encounter (Signed)
Please ask pt to request this per his coumadin clinic

## 2022-06-23 NOTE — Addendum Note (Signed)
Addended by: Rosalene Billings on: 06/23/2022 03:39 PM   Modules accepted: Orders

## 2022-06-29 ENCOUNTER — Ambulatory Visit (INDEPENDENT_AMBULATORY_CARE_PROVIDER_SITE_OTHER): Payer: Medicare HMO

## 2022-06-29 DIAGNOSIS — I5042 Chronic combined systolic (congestive) and diastolic (congestive) heart failure: Secondary | ICD-10-CM

## 2022-06-29 DIAGNOSIS — Z95810 Presence of automatic (implantable) cardiac defibrillator: Secondary | ICD-10-CM | POA: Diagnosis not present

## 2022-06-30 ENCOUNTER — Other Ambulatory Visit: Payer: Self-pay | Admitting: Cardiology

## 2022-06-30 ENCOUNTER — Ambulatory Visit (HOSPITAL_COMMUNITY)
Admission: RE | Admit: 2022-06-30 | Discharge: 2022-06-30 | Disposition: A | Discharge status: Home / Self Care | Payer: No Typology Code available for payment source | Source: Ambulatory Visit | Attending: Vascular Surgery | Admitting: Vascular Surgery

## 2022-06-30 ENCOUNTER — Ambulatory Visit (INDEPENDENT_AMBULATORY_CARE_PROVIDER_SITE_OTHER): Payer: No Typology Code available for payment source | Admitting: Physician Assistant

## 2022-06-30 ENCOUNTER — Telehealth: Payer: Self-pay

## 2022-06-30 ENCOUNTER — Ambulatory Visit (INDEPENDENT_AMBULATORY_CARE_PROVIDER_SITE_OTHER)
Admission: RE | Admit: 2022-06-30 | Discharge: 2022-06-30 | Disposition: A | Discharge status: Home / Self Care | Payer: No Typology Code available for payment source | Source: Ambulatory Visit | Attending: Vascular Surgery | Admitting: Vascular Surgery

## 2022-06-30 VITALS — BP 141/88 | HR 58 | Temp 98.0°F | Ht 71.0 in | Wt 187.1 lb

## 2022-06-30 DIAGNOSIS — I5042 Chronic combined systolic (congestive) and diastolic (congestive) heart failure: Secondary | ICD-10-CM | POA: Diagnosis not present

## 2022-06-30 DIAGNOSIS — I724 Aneurysm of artery of lower extremity: Secondary | ICD-10-CM | POA: Diagnosis not present

## 2022-06-30 DIAGNOSIS — I739 Peripheral vascular disease, unspecified: Secondary | ICD-10-CM

## 2022-06-30 DIAGNOSIS — Z95810 Presence of automatic (implantable) cardiac defibrillator: Secondary | ICD-10-CM | POA: Diagnosis not present

## 2022-06-30 NOTE — Telephone Encounter (Signed)
Remote ICM transmission received.  Attempted call to patient regarding ICM remote transmission and no answer.  

## 2022-06-30 NOTE — Progress Notes (Signed)
EPIC Encounter for ICM Monitoring  Patient Name: BREION NOVACEK is a 76 y.o. male Date: 06/30/2022 Primary Care Physican: Biagio Borg, MD Primary Cardiologist: [name] Electrophysiologist: Mealor 04/20/2022 Not weighing at home 05/07/2022 Weight: 181 lbs (does not weigh consistently at home)                                                           Attempted call to patient and unable to reach.  Transmission reviewed.     DIET:  Pt uses salt shaker at home and does not follow low salt diet.  Also eats restaurant foods.     Optivol thoracic impedance suggesting fluid level improvement after Lasix dosage increased to 40 mg daily on 11/14 but continues to suggesting possible fluid accumulation persist since 9/7.  Fluid index remains above normal threshold since 9/17.   Prescribed: Furosemide 20 mg take 2 tablet(s) (40 mg total) by mouth daily (increased dosage 11/14)   Spironolactone 25 mg take 1 tablet by mouth daily Jardiance 10 mg take 1 tablet by mouth daily   Labs: 05/12/2022 Creatinine 1.04, BUN 24, Potassium 3.8, Sodium 138, GFR >60 05/07/2022 Creatinine 1.10, BUN 20, Potassium 3.7, Sodium 139  03/31/2022 Creatinine 1.08, BUN 18, Potassium 3.5, Sodium 137, GFR >60  03/18/2022 Creatinine 1.04, BUN 26, Potassium 4.7, Sodium 136, GFR >60  03/10/2022 Creatinine 1.16, BUN 26, Potassium 3.9, Sodium 135, GFR >60 02/26/2022 Creatinine 1.16, BUN 24, Potassium 4.2, Sodium 135, GFR >60  02/17/2022 Creatinine 1.61, BUN 38, Potassium 4.5, Sodium 135, GFR 44 01/27/2022 Creatinine 1.33, BUN 24, Potassium 4.2, Sodium 134, GFR 56 A complete set of results can be found in Results Review.   Recommendations: Unable to reach.     Follow-up plan: ICM clinic phone appointment on 07/14/2022 to recheck fluid levels.   91 day device clinic remote transmission 07/28/2022.     EP/Cardiology Office Visits:   07/15/2022 with Desiree Lucy.  Recall 06/14/2022 with Oda Kilts, PA.   Copy of ICM  check sent to Dr. Myles Gip.   Will send to Dr [name] for review if patient is reached.    3 month ICM trend: 06/29/2022.    12-14 Month ICM trend:     Rosalene Billings, RN 06/30/2022 12:18 PM

## 2022-06-30 NOTE — Progress Notes (Signed)
HISTORY AND PHYSICAL     CC:  follow up. Requesting Provider:  Biagio Borg, MD  HPI: This is a 76 y.o. male who is here today for follow up for PAD.  Pt has hx of angiogram on 05/14/2022 by Dr. Carlis Abbott with subsequent right above knee popliteal artery to TP trunk/PTA bypass with ipsilateral reversed GSV, ligation of right popliteal artery aneurysm and ligation of right ATA on 05/20/2022 by Dr. Carlis Abbott for 4.7cm right popliteal artery aneurysm.     Pt was last seen 06/09/2022 and at that time, pt's incisions were healing well.  He did have some swelling and shooting pains that originated from the right knee to the right foot and was persistent since swelling.  The pt returns today for follow up.  He states that he continues to have some swelling in the right leg.  He states it is better when he has been in bed overnight but swells throughout the day.  He still has some numbness around the incisions. He does not really pain in his foot unless the swelling is increased and it is tender around the medial malleolus. He does not have any cramping in his calves when he walks.    The pt is on a statin for cholesterol management.    The pt is on an aspirin.    Other AC:  coumadin The pt is on BB, diuretic for hypertension.  The pt does  have diabetes. Tobacco hx:  former    Past Medical History:  Diagnosis Date   AICD (automatic cardioverter/defibrillator) present    ALLERGIC RHINITIS 10/31/2007   Qualifier: Diagnosis of  By: Jenny Reichmann MD, [place]    Allergy    Cancer [facility]) 12/03/2021   non-Hodgkin's B-cell lymphoma   CHF (congestive heart failure) ([place])    CHOLECYSTECTOMY, HX OF 06/04/2007   Qualifier: Diagnosis of  By: [place], [place]     COLONIC POLYPS, HX OF 10/31/2007   Qualifier: Diagnosis of  By: Jenny Reichmann MD, [place], HX OF 06/04/2007   Qualifier: Diagnosis of  By: [place], [place]     CORONARY ARTERY DISEASE 06/04/2007   Qualifier: Diagnosis of  By:  Danny Lawless CMA, [place]     Diverticulosis    Erectile dysfunction    Eye twitch    right eye since chilhood    History of hiatal hernia    HYPERLIPIDEMIA 06/04/2007   Qualifier: Diagnosis of  By: [place], [place]     Hypersomnolence    HYPERTENSION 06/04/2007   Qualifier: Diagnosis of  By: [place], [place]     Hypothyroidism    ISCHEMIC CARDIOMYOPATHY 06/04/2007   Qualifier: Diagnosis of  By: Danny Lawless CMA, [place]     LV (left ventricular) mural thrombus 02/15/2019   Myocardial infarction [facility]    per patient , his occurred in 1998    Osteoarthritis    Peripheral vascular disease ([place])    PLMD (periodic limb movement disorder)    Sleep apnea    no cpap' per patient , "i was checked for it and they said i didnt have it "    Vertigo    Vitamin D deficiency     Past Surgical History:  Procedure Laterality Date   BIOPSY  11/25/2021   Procedure: BIOPSY;  Surgeon: Jackquline Denmark, MD;  Location: [facility];  Service: Gastroenterology;;   CABG  1999   was done at the [facility] in [place], [place].  Patient  reports that a small vein was taken from under his arm and a small incesion was made.   CHOLECYSTECTOMY     COLONOSCOPY     COLONOSCOPY WITH PROPOFOL N/A 11/25/2021   Procedure: COLONOSCOPY WITH PROPOFOL;  Surgeon: Jackquline Denmark, MD;  [facility];  Service: Gastroenterology;  Laterality: N/A;   ENDOVASCULAR REPAIR OF POPLITEAL ARTERY ANEURYSM Right 05/20/2022   Procedure: LIGATION OF POPLITEAL ARTERY ANEURYSM;  Surgeon: [name], MD;  Location: [facility];  Service: Vascular;  Laterality: Right;   ESOPHAGOGASTRODUODENOSCOPY (EGD) WITH PROPOFOL N/A 11/25/2021   Procedure: ESOPHAGOGASTRODUODENOSCOPY (EGD) WITH PROPOFOL;  Surgeon: Jackquline Denmark, MD;  [facility];  Service: Gastroenterology;  Laterality: N/A;   FEMORAL-POPLITEAL BYPASS GRAFT Right 05/20/2022   Procedure: RIGHT ABOVE KNEE POPLITEAL ARTERY TO TIBIOPERONEAL TRUNK/POSTERIOR TIBIAL  ARTERY BYPASS;  Surgeon: [name], MD;  Location: [facility];  Service: Vascular;  Laterality: Right;   ICD IMPLANT N/A 01/30/2020   Procedure: ICD IMPLANT;  Surgeon: Thompson Grayer, MD;  Location: [facility];  Service: Cardiovascular;  Laterality: N/A;   IR IMAGING GUIDED PORT INSERTION  12/04/2021   KNEE ARTHROPLASTY Left 08/05/2017   Procedure: LEFT TOTAL KNEE ARTHROPLASTY WITH COMPUTER NAVIGATION;  Surgeon: Rod Can, MD;  [facility];  Service: Orthopedics;  Laterality: Left;  Needs RNFA   LIGATION OF CILIAC ARTERY Right 05/20/2022   Procedure: LIGATION OF RIGHT ANTERIOR TIBIAL ARTERY;  Surgeon: [name], MD;  Location: [facility];  Service: Vascular;  Laterality: Right;   LOWER EXTREMITY ANGIOGRAPHY Right 05/14/2022   Procedure: Lower Extremity Angiography;  Surgeon: [name], MD;  Location: [facility];  Service: Cardiovascular;  Laterality: Right;   PENILE PROSTHESIS IMPLANT     POLYPECTOMY  11/25/2021   Procedure: POLYPECTOMY;  Surgeon: Jackquline Denmark, MD;  [facility];  Service: Gastroenterology;;   REPLACEMENT TOTAL KNEE  06/12/2013   spinal cyst     THORACOTOMY     left anterior; wound exploration and debridement   TONSILLECTOMY AND ADENOIDECTOMY     age 19   VEIN HARVEST Right 05/20/2022   Procedure: RIGHT GREATER [place];  Surgeon: [name], MD;  Location: [facility];  Service: Vascular;  Laterality: Right;    Allergies  Allergen Reactions   Ace Inhibitors Cough   Statins Other (See Comments)    Muscle pains    Current Outpatient Medications  Medication Sig Dispense Refill   ARTIFICIAL TEAR SOLUTION OP Place 1 drop into both eyes daily as needed (dry eyes).     Ascorbic Acid (VITA-C PO) Take 1,000 mg by mouth daily.     aspirin EC 81 MG tablet Take 1 tablet (81 mg total) by mouth daily at 6 (six) AM. Swallow whole. 30 tablet 12   b complex vitamins capsule Take 1 capsule by mouth daily.      empagliflozin (JARDIANCE) 10 MG TABS tablet Take 10 mg by mouth daily.     ezetimibe (ZETIA) 10 MG tablet TAKE 1 TABLET EVERY DAY (Patient taking differently: Take 10 mg by mouth daily.) 90 tablet 3   feeding supplement (ENSURE ENLIVE / ENSURE PLUS) LIQD Take 237 mLs by mouth 3 (three) times daily between meals. (Patient taking differently: Take 237 mLs by mouth 2 (two) times daily between meals.) 237 mL 12   furosemide (LASIX) 20 MG tablet Take 2 tablets (40 mg total) by mouth daily. 180 tablet 3   gabapentin (NEURONTIN) 100 MG capsule Take 1  capsule (100 mg total) by mouth 2 (two) times daily as needed. 30 capsule 0   lidocaine-prilocaine (EMLA) cream Apply a small amount to port a cath site (do not rub in) and cover with plastic wrap 1 hour prior to infusion appointments 30 g 3   meclizine (ANTIVERT) 12.5 MG tablet Take 1 tablet (12.5 mg total) by mouth 3 (three) times daily as needed for dizziness. 30 tablet 1   meloxicam (MOBIC) 15 MG tablet Take 15 mg by mouth daily.     metoprolol succinate (TOPROL-XL) 50 MG 24 hr tablet Take 50 mg by mouth daily.     Multiple Vitamins-Minerals (MULTIVITAMIN WITH MINERALS) tablet Take 1 tablet by mouth daily. Centrum silver     oxyCODONE-acetaminophen (PERCOCET/ROXICET) 5-325 MG tablet Take 1 tablet by mouth every 6 (six) hours as needed for moderate pain. 20 tablet 0   predniSONE (DELTASONE) 20 MG tablet Take 5 tablets (100 mg total) by mouth daily with breakfast. Take 100 mg by mouth daily w/food (Patient taking differently: Take 100 mg by mouth daily as needed (Before Chemo). Take 100 mg by mouth daily w/food) 15 tablet 0   rosuvastatin (CRESTOR) 5 MG tablet TAKE 1 TABLET EVERY OTHER DAY 45 tablet 10   triamcinolone (NASACORT) 55 MCG/ACT AERO nasal inhaler Place 2 sprays into the nose daily. (Patient taking differently: Place 2 sprays into the nose daily as needed (allergies).) 3 Inhaler 3   warfarin (COUMADIN) 5 MG tablet Take 1 tablet daily except 1  1/2 tablets on Mondays or as directed. 90 tablet 5   No current facility-administered medications for this visit.    Family History  Problem Relation Age of Onset   Stomach cancer Mother        smokes   Lung cancer Father        chewed tobacco   Heart disease Brother        first MI at 84yo, now 76 for transplant list/ ICM   Pancreatic cancer Brother    Colon cancer Maternal Grandfather    COPD Son        was a smoker   Pancreatic cancer Maternal Uncle    Pancreatic cancer Cousin        mat side x 4   Esophageal cancer Neg Hx    Prostate cancer Neg Hx    Rectal cancer Neg Hx     Social History   Socioeconomic History   Marital status: Married    Spouse name: [name]   Number of children: 3   Years of education: Not on file   Highest education level: Not on file  Occupational History   Occupation: truck Education administrator: [name]  Tobacco Use   Smoking status: Former    Packs/day: 4.00    Years: 17.00    Total pack years: 68.00    Types: Cigarettes    Quit date: 05/07/1981    Years since quitting: 41.1    Passive exposure: Never   Smokeless tobacco: Never   Tobacco comments:    Pt states that he would let most of them "burn" pt states that he used anywhere between 4-5PPD  Vaping Use   Vaping Use: Never used  Substance and Sexual Activity   Alcohol use: Yes    Alcohol/week: 1.0 standard drink of alcohol    Types: 1 Cans of beer per week   Drug use: No   Sexual activity: Not on file  Other Topics Concern   Not  on file  Social History Narrative   Lives in [place] with spouse   Retired Administrator   Social Determinants of Health   Financial Resource Strain: [place]  (10/27/2021)   Overall Financial Resource Strain (CARDIA)    Difficulty of Paying Living Expenses: Not hard at all  Food Insecurity: No Food Insecurity (05/22/2022)   Hunger Vital Sign    Worried About Running Out of Food in the Last Year: Never true    Ran Out of Food in the  Last Year: Never true  Transportation Needs: No Transportation Needs (05/22/2022)   PRAPARE - Hydrologist (Medical): No    Lack of Transportation (Non-Medical): No  Physical Activity: Inactive (10/27/2021)   Exercise Vital Sign    Days of Exercise per Week: 0 days    Minutes of Exercise per Session: 0 min  Stress: No Stress Concern Present (10/27/2021)   [place]    Feeling of Stress : Not at all  Social Connections: Moderately Isolated (10/27/2021)   Social Connection and Isolation Panel [NHANES]    Frequency of Communication with Friends and Family: More than three times a week    Frequency of Social Gatherings with Friends and Family: More than three times a week    Attends Religious Services: Never    Marine scientist or Organizations: No    Attends Archivist Meetings: Never    Marital Status: Married  Human resources officer Violence: Not At Risk (05/22/2022)   Humiliation, Afraid, Rape, and Kick questionnaire    Fear of Current or Ex-Partner: No    Emotionally Abused: No    Physically Abused: No    Sexually Abused: No     REVIEW OF SYSTEMS:   PHYSICAL EXAMINATION:  Today's Vitals   06/30/22 1340  BP: (!) 141/88  Pulse: (!) 58  Temp: 98 F (36.7 C)  TempSrc: Temporal  SpO2: 99%  Weight: 187 lb 1.6 oz (84.9 kg)  Height: '5\' 11"'$  (1.803 m)  PainSc: 5    Body mass index is 26.1 kg/m.   General:  WDWN in NAD; vital signs documented above Gait: Not observed HENT: WNL, normocephalic Pulmonary: normal non-labored breathing , without wheezing Skin: without rashes; incisions are well healed. Vascular Exam/Pulses:  Right Left  Femoral 2+ (normal) 2+ (normal)  Popliteal Unable to palpate Unable to palpate  DP 1+ (weak) Brisk doppler flow Brisk doppler flow  PT Brisk doppler flow Brisk doppler flow   Extremities: +swelling RLE; thigh is soft Musculoskeletal: no  muscle wasting or atrophy  Neurologic: A&O X 3 Psychiatric:  The pt has Normal affect.   Non-Invasive Vascular Imaging:   ABI's/TBI's on 06/30/2022: Right:  1.26/0.63 - Great toe pressure: 76 Left:  1.28/0.89 - Great toe pressure: 108  Arterial duplex on 06/30/2022: Right Graft #1: Right above knee popliteal-tibial peroneal trunk/PTA  artery bypass  +------------------+--------+--------+---------+--------+                   PSV cm/sStenosisWaveform Comments  +------------------+--------+--------+---------+--------+  Inflow           46              triphasic          +------------------+--------+--------+---------+--------+  Prox Anastomosis  43              triphasic          +------------------+--------+--------+---------+--------+  Proximal Graft  36              triphasic          +------------------+--------+--------+---------+--------+  Mid Graft         28              triphasic          +------------------+--------+--------+---------+--------+  Distal Graft      59              triphasic          +------------------+--------+--------+---------+--------+  Distal Anastomosis91              triphasic          +------------------+--------+--------+---------+--------+  Outflow          46              triphasic          +------------------+--------+--------+---------+--------+   Summary:  Right: Patent Right graft. PSV lower than 40cm/s may suggest a threatened graft.   Large cystic structure without vascularity likely post surgical in etiology in the Right mid-distal thigh.     ASSESSMENT/PLAN:: 76 y.o. male here for follow up for PAD with hx of angiogram on 05/14/2022 by Dr. Carlis Abbott with subsequent right above knee popliteal artery to TP trunk/PTA bypass with ipsilateral reversed GSV, ligation of right popliteal artery aneurysm and ligation of right ATA on 05/20/2022 by Dr. Carlis Abbott for 4.7cm right popliteal artery aneurysm.      -pt with decreased velocities within the graft.  Discussed with Dr. Carlis Abbott and he evaluated pt.  Given he has palpable right DP pulse and he is on coumadin, will have him return in 3 months for close follow up with RLE arterial duplex and ABI and see Dr. Carlis Abbott.   -given his swelling, discussed leg elevation and he was measured for knee high compression sock today.  Discussed to put on before he gets out of bed and take off at night.   -hematoma on duplex-thigh is soft-discussed with pt this could take a couple of months to resolve. -continue asa/statin.  He takes his statin 3x/week due to myalgias.      Leontine Locket, [facility] [phone]  Clinic MD:   Carlis Abbott

## 2022-07-01 ENCOUNTER — Encounter: Payer: Self-pay | Admitting: Hematology

## 2022-07-01 LAB — BASIC METABOLIC PANEL WITH GFR
BUN/Creatinine Ratio: 15 (ref 10–24)
BUN: 17 mg/dL (ref 8–27)
CO2: 26 mmol/L (ref 20–29)
Calcium: 9.7 mg/dL (ref 8.6–10.2)
Chloride: 100 mmol/L (ref 96–106)
Creatinine, Ser: 1.13 mg/dL (ref 0.76–1.27)
Glucose: 111 mg/dL — ABNORMAL HIGH (ref 70–99)
Potassium: 3.9 mmol/L (ref 3.5–5.2)
Sodium: 144 mmol/L (ref 134–144)
eGFR: 67 mL/min/1.73

## 2022-07-01 NOTE — Progress Notes (Signed)
Cardiology Office Note:    Date:  07/15/2022   ID:  Rajeev, Escue 1946/06/07, MRN [id number]  PCP:  Biagio Borg, MD  [place] [facility] Cardiologist:  Kirk Ruths, MD Electrophysiologist:  Thompson Grayer, MD     Referring MD: Biagio Borg, MD   Chief Complaint:  Follow-up     History of Present Illness:   [name] [name] is a 76 y.o. male with history of CAD MI 1998, minimally invasive CABG with LIMA-LAD 2008 , ICM- Cardiac MRI July 2020 showed ejection fraction approximately 30% and apical thrombus noted. Mild aortic and mitral regurgitation LVEF 32% on  NST 2021, ICD 01/2020. Echo 12/2020 LVEF 30-35%. Also has HTN, HLD and history of apical thrombus on coumadin with INR goal 2-3.  Last seen by Mr. Cleaver NP 02/2022 and was weak undergoing chemo for lymphoma.      Patient comes in for f/u. He finished chemo in Oct and starting to get his strength back but it's slow. Gets short of breath with walking short distance or carrying something. No chest pain. Defib hasn't gone off. Has had dizziness-laying in bed and room was spinning. It happened all day long one day. Now dizziness if he stands up which is new. Optivol thoracic impedence suggest fluid overload despite increased lasix 40 mg daily. Right leg swelling since vascular surgery. Eats out regularly and using ensure twice daily.        Past Medical History:  Diagnosis Date   AICD (automatic cardioverter/defibrillator) present    ALLERGIC RHINITIS 10/31/2007   Qualifier: Diagnosis of  By: Jenny Reichmann MD, [place]    Allergy    Cancer [facility]) 12/03/2021   non-Hodgkin's B-cell lymphoma   CHF (congestive heart failure) ([place])    CHOLECYSTECTOMY, HX OF 06/04/2007   Qualifier: Diagnosis of  By: [place], [place]     COLONIC POLYPS, HX OF 10/31/2007   Qualifier: Diagnosis of  By: Jenny Reichmann MD, [place], HX OF 06/04/2007   Qualifier: Diagnosis of  By: [place], [place]     CORONARY  ARTERY DISEASE 06/04/2007   Qualifier: Diagnosis of  By: Danny Lawless CMA, [place]     Diverticulosis    Erectile dysfunction    Eye twitch    right eye since chilhood    History of hiatal hernia    HYPERLIPIDEMIA 06/04/2007   Qualifier: Diagnosis of  By: [place], [place]     Hypersomnolence    HYPERTENSION 06/04/2007   Qualifier: Diagnosis of  By: [place], [place]     Hypothyroidism    ISCHEMIC CARDIOMYOPATHY 06/04/2007   Qualifier: Diagnosis of  By: Danny Lawless CMA, [place]     LV (left ventricular) mural thrombus 02/15/2019   Myocardial infarction [facility]    per patient , his occurred in 1998    Osteoarthritis    Peripheral vascular disease ([place])    PLMD (periodic limb movement disorder)    Sleep apnea    no cpap' per patient , "i was checked for it and they said i didnt have it "    Vertigo    Vitamin D deficiency    Current Medications: Current Meds  Medication Sig   ARTIFICIAL TEAR SOLUTION OP Place 1 drop into both eyes daily as needed (dry eyes).   Ascorbic Acid (VITA-C PO) Take 1,000 mg by mouth daily.   aspirin EC 81 MG tablet Take 1 tablet (81 mg total) by mouth daily at 6 (  six) AM. Swallow whole.   b complex vitamins capsule Take 1 capsule by mouth daily.   ezetimibe (ZETIA) 10 MG tablet TAKE 1 TABLET EVERY DAY (Patient taking differently: Take 10 mg by mouth daily.)   feeding supplement (ENSURE ENLIVE / ENSURE PLUS) LIQD Take 237 mLs by mouth 3 (three) times daily between meals. (Patient taking differently: Take 237 mLs by mouth 2 (two) times daily between meals.)   furosemide (LASIX) 20 MG tablet Take 2 tablets (40 mg total) by mouth daily.   lidocaine-prilocaine (EMLA) cream Apply a small amount to port a cath site (do not rub in) and cover with plastic wrap 1 hour prior to infusion appointments   meclizine (ANTIVERT) 12.5 MG tablet Take 1 tablet (12.5 mg total) by mouth 3 (three) times daily as needed for dizziness.   meloxicam (MOBIC) 15 MG tablet Take 15 mg by mouth  daily.   metoprolol succinate (TOPROL-XL) 50 MG 24 hr tablet Take 50 mg by mouth daily.   Multiple Vitamins-Minerals (MULTIVITAMIN WITH MINERALS) tablet Take 1 tablet by mouth daily. Centrum silver   oxyCODONE-acetaminophen (PERCOCET/ROXICET) 5-325 MG tablet Take 1 tablet by mouth every 6 (six) hours as needed for moderate pain.   potassium chloride SA (KLOR-CON M20) 20 MEQ tablet Take 1 tablet (20 mEq total) by mouth daily.   rosuvastatin (CRESTOR) 5 MG tablet TAKE 1 TABLET EVERY OTHER DAY   triamcinolone (NASACORT) 55 MCG/ACT AERO nasal inhaler Place 2 sprays into the nose daily. (Patient taking differently: Place 2 sprays into the nose daily as needed (allergies).)   warfarin (COUMADIN) 5 MG tablet Take 1 tablet daily except 1 1/2 tablets on Mondays or as directed.    Allergies:   Ace inhibitors and Statins   Social History   Tobacco Use   Smoking status: Former    Packs/day: 4.00    Years: 17.00    Total pack years: 68.00    Types: Cigarettes    Quit date: 05/07/1981    Years since quitting: 41.2    Passive exposure: Never   Smokeless tobacco: Never   Tobacco comments:    Pt states that he would let most of them "burn" pt states that he used anywhere between 4-5PPD  Vaping Use   Vaping Use: Never used  Substance Use Topics   Alcohol use: Yes    Alcohol/week: 1.0 standard drink of alcohol    Types: 1 Cans of beer per week   Drug use: No    Family Hx: The patient's family history includes COPD in his son; Colon cancer in his maternal grandfather; Heart disease in his brother; Lung cancer in his father; Pancreatic cancer in his brother, cousin, and maternal uncle; Stomach cancer in his mother. There is no history of Esophageal cancer, Prostate cancer, or Rectal cancer.  ROS     Physical Exam:    VS:  BP 124/62 (BP Location: Right Leg, Patient Position: Sitting, Cuff Size: Normal)   Pulse 67   Ht '5\' 11"'$  (1.803 m)   Wt 191 lb 8 oz (86.9 kg)   SpO2 98%   BMI 26.71 kg/m      Wt Readings from Last 3 Encounters:  07/15/22 191 lb 8 oz (86.9 kg)  06/30/22 187 lb 1.6 oz (84.9 kg)  06/09/22 187 lb 4.8 oz (85 kg)    Physical Exam  GEN: Well nourished, well developed, in no acute distress  Neck: slight increase JVD, carotid bruits, or masses Cardiac:RRR; no murmurs, rubs, or gallops  Respiratory:  basilar crackles GI: soft, nontender, nondistended, + BS Ext: RLE edema 2-3 plus, ankle edema on left. distal pulses present bilaterally Neuro:  Alert and Oriented x 3, Psych: euthymic mood, full affect        EKGs/Labs/Other Test Reviewed:    EKG:  EKG is   ordered today.  The ekg ordered today demonstrates paced rhythm with PVC's  Recent Labs: 10/09/2021: TSH 2.95 01/19/2022: B Natriuretic Peptide 200.0 05/12/2022: Magnesium 2.1 05/20/2022: ALT 17 05/22/2022: Hemoglobin 8.7; Platelets 88 06/30/2022: BUN 17; Creatinine, Ser 1.13; Potassium 3.9; Sodium 144   Recent Lipid Panel Recent Labs    10/09/21 1107 05/21/22 0422  CHOL 182 109  TRIG 209.0* 73  HDL 42.20 29*  VLDL 41.8* 15  LDLCALC  --  65  LDLDIRECT 102.0  --      Prior CV Studies:   Echocardiogram 01/12/2022 IMPRESSIONS     1. Left ventricular ejection fraction, by estimation, is 30 to 35%. The  left ventricle has moderately decreased function. The left ventricle  demonstrates regional wall motion abnormalities (see scoring  diagram/findings for description). Left ventricular   diastolic parameters are indeterminate. There is akinesis of the left  ventricular, mid-apical anteroseptal wall, inferoseptal wall and apical  segment.   2. Right ventricular systolic function is normal. The right ventricular  size is normal.   3. Left atrial size was moderately dilated.   4. The mitral valve is grossly normal. Trivial mitral valve  regurgitation. No evidence of mitral stenosis.   5. The aortic valve is tricuspid. There is moderate calcification of the  aortic valve. Aortic valve regurgitation  is mild. Mild aortic valve  stenosis.   6. The inferior vena cava is normal in size with <50% respiratory  variability, suggesting right atrial pressure of 8 mmHg.   Comparison(s): No significant change from prior study.    Nuclear stress test 01/31/2021   Nuclear stress EF: 29%. There was no ST segment deviation noted during stress. Defect 1: There is a medium defect of severe severity present in the mid anterior, mid anteroseptal, apical anterior, apical septal and apex location. Defect 2: There is a medium defect of moderate severity present in the mid inferior, mid inferolateral, apical inferior and apical lateral location. Findings consistent with prior myocardial infarction in the LAD and RCA territories. The left ventricular ejection fraction is severely decreased (<30%).    Risk Assessment/Calculations/Metrics:              ASSESSMENT & PLAN:   No problem-specific Assessment & Plan notes found for this encounter.   Acute on chronic systolic CHF- optival running high and increased DOE and leg edema-right worse than left since vascular surgery. Complicated by orthostatic hypotension. Will increase lasix 60 mg daily for 3 days, than back to 40 mg daily.  Add K 20 meq daily. Check Bmet today, early f/u in [place] office. 2 gm sodium diet.   Dizziness-some vertigo symptoms but hasn't used meclizine. Also orthostatic with BP dropping 428 to 90 systolic with standing. Still recovering from chemo. Orthostatic precautions discussed. Can't wear compression hose with recent vascular surgery and edema. Increase water intake and decrease tea/soda.   CAD MI 1998, minimally invasive CABG with LIMA-LAD 2008-no angina  ICM LVEF30-35% echo 01/2022 S/P ICD-will hold off on repeat echo at this time  HTN- orthostatic today see above  HLD LDL 65 05/2022  History of apical thrombus on coumadin-no bleeding problems and Hbg stable 05/2022  Dispo:  No follow-ups on file.    Medication Adjustments/Labs and Tests Ordered: Current medicines are reviewed at length with the patient today.  Concerns regarding medicines are outlined above.  Tests Ordered: Orders Placed This Encounter  Procedures   Basic metabolic panel   EKG 57-WIOM   Medication Changes: Meds ordered this encounter  Medications   potassium chloride SA (KLOR-CON M20) 20 MEQ tablet    Sig: Take 1 tablet (20 mEq total) by mouth daily.    Dispense:  90 tablet    Refill:  3   Signed, Ermalinda Barrios, PA-C  07/15/2022 10:19 AM    [address], [place], [place]  [address] Phone: [phone]; Fax: [phone]

## 2022-07-06 ENCOUNTER — Ambulatory Visit: Payer: Medicare HMO | Attending: Cardiology | Admitting: *Deleted

## 2022-07-06 ENCOUNTER — Encounter: Payer: Self-pay | Admitting: *Deleted

## 2022-07-06 DIAGNOSIS — Z5181 Encounter for therapeutic drug level monitoring: Secondary | ICD-10-CM | POA: Diagnosis not present

## 2022-07-06 DIAGNOSIS — I240 Acute coronary thrombosis not resulting in myocardial infarction: Secondary | ICD-10-CM

## 2022-07-06 LAB — POCT INR: INR: 3.4 — AB (ref 2.0–3.0)

## 2022-07-06 NOTE — Patient Instructions (Signed)
Pt started chemo 12/15/2021. Finished chemo Pt takes Prednisone 100 mg on the first 5 days of chemo treatment.  Hold warfarin tonight then decrease dose to 1 tablet daily  Recheck in 3 wks

## 2022-07-08 ENCOUNTER — Other Ambulatory Visit: Payer: Self-pay

## 2022-07-08 DIAGNOSIS — I739 Peripheral vascular disease, unspecified: Secondary | ICD-10-CM

## 2022-07-14 ENCOUNTER — Ambulatory Visit (INDEPENDENT_AMBULATORY_CARE_PROVIDER_SITE_OTHER): Payer: Medicare HMO

## 2022-07-14 DIAGNOSIS — I5042 Chronic combined systolic (congestive) and diastolic (congestive) heart failure: Secondary | ICD-10-CM

## 2022-07-14 DIAGNOSIS — Z95810 Presence of automatic (implantable) cardiac defibrillator: Secondary | ICD-10-CM

## 2022-07-14 NOTE — Progress Notes (Signed)
EPIC Encounter for ICM Monitoring  Patient Name: [name] [name] is a 76 y.o. male Date: 07/14/2022 Primary Care Physican: Biagio Borg, MD Primary Cardiologist: [name] Electrophysiologist: Mealor 04/20/2022 Not weighing at home 05/07/2022 Weight: 181 lbs (does not weigh consistently at home)                                                           Spoke with patient and heart failure questions reviewed.  Transmission results reviewed.  Pt asymptomatic for fluid accumulation.  Reports feeling well at this time and voices no complaints.   He is drinking 2 cans of Ensure.       DIET:  Pt uses salt shaker at home and does not follow low salt diet.  Also eats restaurant foods.     Optivol thoracic impedance suggesting fluid level improvement after Lasix dosage increased to 40 mg daily on 11/14 but continues to suggesting possible fluid accumulation persist since 9/7.  Fluid index remains above normal threshold since 9/17.   Prescribed: Furosemide 20 mg take 2 tablet(s) (40 mg total) by mouth daily (increased dosage 11/14)   Spironolactone 25 mg take 1 tablet by mouth daily Jardiance 10 mg take 1 tablet by mouth daily   Labs: 05/12/2022 Creatinine 1.04, BUN 24, Potassium 3.8, Sodium 138, GFR >60 05/07/2022 Creatinine 1.10, BUN 20, Potassium 3.7, Sodium 139  03/31/2022 Creatinine 1.08, BUN 18, Potassium 3.5, Sodium 137, GFR >60  03/18/2022 Creatinine 1.04, BUN 26, Potassium 4.7, Sodium 136, GFR >60  03/10/2022 Creatinine 1.16, BUN 26, Potassium 3.9, Sodium 135, GFR >60 02/26/2022 Creatinine 1.16, BUN 24, Potassium 4.2, Sodium 135, GFR >60  02/17/2022 Creatinine 1.61, BUN 38, Potassium 4.5, Sodium 135, GFR 44 01/27/2022 Creatinine 1.33, BUN 24, Potassium 4.2, Sodium 134, GFR 56 A complete set of results can be found in Results Review.   Recommendations:  Discussed diet and salt intake at length.  He does not follow low salt diet and frequently eats at restaurants and explained those  foods are very high in salt.  Encouraged to decrease salt intake and limit to 2000 mg a day.   Follow-up plan: ICM clinic phone appointment on 08/24/2022 (fluid level recheck at  12/21 OV).   91 day device clinic remote transmission 07/28/2022.     EP/Cardiology Office Visits:   07/15/2022 with Desiree Lucy.  07/30/2022 with Oda Kilts, PA.   Copy of ICM check sent to Dr. Myles Gip.   Will send to Dr [name] for review if patient is reached.    3 month ICM trend: 07/14/2022.    12-14 Month ICM trend:     Rosalene Billings, RN 07/14/2022 4:23 PM

## 2022-07-15 ENCOUNTER — Ambulatory Visit: Payer: Medicare HMO | Attending: Physician Assistant | Admitting: Physician Assistant

## 2022-07-15 ENCOUNTER — Encounter: Payer: Self-pay | Admitting: Physician Assistant

## 2022-07-15 VITALS — BP 124/62 | HR 67 | Ht 71.0 in | Wt 191.5 lb

## 2022-07-15 DIAGNOSIS — I255 Ischemic cardiomyopathy: Secondary | ICD-10-CM

## 2022-07-15 DIAGNOSIS — E782 Mixed hyperlipidemia: Secondary | ICD-10-CM | POA: Diagnosis not present

## 2022-07-15 DIAGNOSIS — I951 Orthostatic hypotension: Secondary | ICD-10-CM | POA: Diagnosis not present

## 2022-07-15 DIAGNOSIS — I5023 Acute on chronic systolic (congestive) heart failure: Secondary | ICD-10-CM

## 2022-07-15 DIAGNOSIS — I10 Essential (primary) hypertension: Secondary | ICD-10-CM

## 2022-07-15 DIAGNOSIS — I513 Intracardiac thrombosis, not elsewhere classified: Secondary | ICD-10-CM

## 2022-07-15 DIAGNOSIS — R42 Dizziness and giddiness: Secondary | ICD-10-CM

## 2022-07-15 DIAGNOSIS — I2510 Atherosclerotic heart disease of native coronary artery without angina pectoris: Secondary | ICD-10-CM

## 2022-07-15 DIAGNOSIS — I5042 Chronic combined systolic (congestive) and diastolic (congestive) heart failure: Secondary | ICD-10-CM | POA: Diagnosis not present

## 2022-07-15 LAB — BASIC METABOLIC PANEL
BUN/Creatinine Ratio: 24 (ref 10–24)
BUN: 30 mg/dL — ABNORMAL HIGH (ref 8–27)
CO2: 26 mmol/L (ref 20–29)
Calcium: 9.5 mg/dL (ref 8.6–10.2)
Chloride: 100 mmol/L (ref 96–106)
Creatinine, Ser: 1.27 mg/dL (ref 0.76–1.27)
Glucose: 98 mg/dL (ref 70–99)
Potassium: 4.2 mmol/L (ref 3.5–5.2)
Sodium: 141 mmol/L (ref 134–144)
eGFR: 58 mL/min/{1.73_m2} — ABNORMAL LOW (ref >59)

## 2022-07-15 MED ORDER — POTASSIUM CHLORIDE CRYS ER 20 MEQ PO TBCR: 20.0000 meq | EXTENDED_RELEASE_TABLET | Freq: Every day | ORAL | 3 refills | Status: DC

## 2022-07-15 NOTE — Patient Instructions (Addendum)
Medication Instructions:  Your physician has recommended you make the following change in your medication:  1) INCREASE Lasix (furosemide) to 60 mg for 3-4 days 2) START taking potassium 20 meq daily  *If you need a refill on your cardiac medications before your next appointment, please call your pharmacy*  Lab Work: TODAY: BMET If you have labs (blood work) drawn today and your tests are completely normal, you will receive your results only by: [place] (if you have MyChart) OR A paper copy in the mail If you have any lab test that is abnormal or we need to change your treatment, we will call you to review the results.  Follow-Up: At [facility], you and your health needs are our priority.  As part of our continuing mission to provide you with exceptional heart care, we have created designated Provider Care Teams.  These Care Teams include your primary Cardiologist (physician) and Advanced Practice Providers (APPs -  Physician Assistants and Nurse Practitioners) who all work together to provide you with the care you need, when you need it.  Your next appointment:   2 weeks  The format for your next appointment:   In Person  Provider:   Ignacia Bayley, NP   Important Information About Sugar

## 2022-07-27 ENCOUNTER — Ambulatory Visit: Payer: No Typology Code available for payment source | Attending: Cardiology | Admitting: *Deleted

## 2022-07-27 DIAGNOSIS — I240 Acute coronary thrombosis not resulting in myocardial infarction: Secondary | ICD-10-CM | POA: Diagnosis not present

## 2022-07-27 DIAGNOSIS — Z5181 Encounter for therapeutic drug level monitoring: Secondary | ICD-10-CM | POA: Diagnosis not present

## 2022-07-27 LAB — POCT INR: INR: 4.6 — AB (ref 2.0–3.0)

## 2022-07-27 NOTE — Patient Instructions (Signed)
Pt started chemo 12/15/2021. Finished chemo Pt takes Prednisone 100 mg on the first 5 days of chemo treatment.  Hold warfarin tonight then decrease dose to 1 tablet daily except 1/2 tablet on Tuesdays Recheck in 2 wks

## 2022-07-28 ENCOUNTER — Ambulatory Visit (INDEPENDENT_AMBULATORY_CARE_PROVIDER_SITE_OTHER): Payer: Medicare HMO

## 2022-07-28 DIAGNOSIS — I255 Ischemic cardiomyopathy: Secondary | ICD-10-CM | POA: Diagnosis not present

## 2022-07-28 LAB — CUP PACEART REMOTE DEVICE CHECK
Battery Remaining Longevity: 99 mo
Battery Voltage: 3.01 V
Brady Statistic AP VP Percent: 0.07 %
Brady Statistic AP VS Percent: 47.57 %
Brady Statistic AS VP Percent: 0.05 %
Brady Statistic AS VS Percent: 52.31 %
Brady Statistic RA Percent Paced: 42.33 %
Brady Statistic RV Percent Paced: 0.1 %
Date Time Interrogation Session: 20231219022505
HighPow Impedance: 61 Ohm
Implantable Lead Connection Status: 753985
Implantable Lead Connection Status: 753985
Implantable Lead Implant Date: 20210622
Implantable Lead Implant Date: 20210622
Implantable Lead Location: 753859
Implantable Lead Location: 753860
Implantable Lead Model: 5076
Implantable Pulse Generator Implant Date: 20210622
Lead Channel Impedance Value: 285 Ohm
Lead Channel Impedance Value: 342 Ohm
Lead Channel Impedance Value: 418 Ohm
Lead Channel Pacing Threshold Amplitude: 0.5 V
Lead Channel Pacing Threshold Amplitude: 0.625 V
Lead Channel Pacing Threshold Pulse Width: 0.4 ms
Lead Channel Pacing Threshold Pulse Width: 0.4 ms
Lead Channel Sensing Intrinsic Amplitude: 4.125 mV
Lead Channel Sensing Intrinsic Amplitude: 4.125 mV
Lead Channel Sensing Intrinsic Amplitude: 6.75 mV
Lead Channel Sensing Intrinsic Amplitude: 6.75 mV
Lead Channel Setting Pacing Amplitude: 1.5 V
Lead Channel Setting Pacing Amplitude: 2.5 V
Lead Channel Setting Pacing Pulse Width: 0.4 ms
Lead Channel Setting Sensing Sensitivity: 0.3 mV
Zone Setting Status: 755011

## 2022-07-29 ENCOUNTER — Ambulatory Visit: Payer: Medicare HMO | Attending: Nurse Practitioner | Admitting: Nurse Practitioner

## 2022-07-29 ENCOUNTER — Encounter: Payer: Self-pay | Admitting: Nurse Practitioner

## 2022-07-29 VITALS — BP 110/60 | HR 60 | Ht 71.0 in | Wt 193.0 lb

## 2022-07-29 DIAGNOSIS — I10 Essential (primary) hypertension: Secondary | ICD-10-CM

## 2022-07-29 DIAGNOSIS — I2510 Atherosclerotic heart disease of native coronary artery without angina pectoris: Secondary | ICD-10-CM | POA: Diagnosis not present

## 2022-07-29 DIAGNOSIS — E785 Hyperlipidemia, unspecified: Secondary | ICD-10-CM

## 2022-07-29 DIAGNOSIS — I255 Ischemic cardiomyopathy: Secondary | ICD-10-CM

## 2022-07-29 DIAGNOSIS — I951 Orthostatic hypotension: Secondary | ICD-10-CM | POA: Diagnosis not present

## 2022-07-29 DIAGNOSIS — I5022 Chronic systolic (congestive) heart failure: Secondary | ICD-10-CM | POA: Diagnosis not present

## 2022-07-29 DIAGNOSIS — I739 Peripheral vascular disease, unspecified: Secondary | ICD-10-CM

## 2022-07-29 DIAGNOSIS — I240 Acute coronary thrombosis not resulting in myocardial infarction: Secondary | ICD-10-CM | POA: Diagnosis not present

## 2022-07-29 NOTE — Progress Notes (Signed)
Office Visit    Patient Name: SHADE KALEY Date of Encounter: 07/29/2022  Primary Care Provider:  Biagio Borg, MD Primary Cardiologist:  Kirk Ruths, MD  Chief Complaint    76 year old male with history of CAD status post MI in 1998 with subsequent minimally invasive CABG with a LIMA to the LAD in 2008, chronic HFrEF, ischemic cardiomyopathy, hypertension, hyperlipidemia, LV thrombus on chronic warfarin, PAD, and lymphoma, who presents for heart failure follow-up.  Past Medical History    Past Medical History:  Diagnosis Date   AICD (automatic cardioverter/defibrillator) present    ALLERGIC RHINITIS 10/31/2007   Qualifier: Diagnosis of  By: Jenny Reichmann MD, [place]    Allergy    Cancer [facility]) 12/03/2021   non-Hodgkin's B-cell lymphoma   CHF (congestive heart failure) ([place])    CHOLECYSTECTOMY, HX OF 06/04/2007   Qualifier: Diagnosis of  By: [place], [place]     COLONIC POLYPS, HX OF 10/31/2007   Qualifier: Diagnosis of  By: Jenny Reichmann MD, [place], HX OF 06/04/2007   Qualifier: Diagnosis of  By: [place], [place]     CORONARY ARTERY DISEASE 06/04/2007   Qualifier: Diagnosis of  By: Danny Lawless CMA, [place]     Diverticulosis    Erectile dysfunction    Eye twitch    right eye since chilhood    History of hiatal hernia    HYPERLIPIDEMIA 06/04/2007   Qualifier: Diagnosis of  By: [place], [place]     Hypersomnolence    HYPERTENSION 06/04/2007   Qualifier: Diagnosis of  By: [place], [place]     Hypothyroidism    ISCHEMIC CARDIOMYOPATHY 06/04/2007   Qualifier: Diagnosis of  By: Danny Lawless CMA, [place]     LV (left ventricular) mural thrombus 02/15/2019   Myocardial infarction [facility])    per patient , his occurred in 1998    Osteoarthritis    Peripheral vascular disease ([place])    PLMD (periodic limb movement disorder)    Sleep apnea    no cpap' per patient , "i was checked for it and they said i didnt have it "    Vertigo    Vitamin D  deficiency    Past Surgical History:  Procedure Laterality Date   BIOPSY  11/25/2021   Procedure: BIOPSY;  Surgeon: Jackquline Denmark, MD;  Location: [facility];  Service: Gastroenterology;;   CABG  1999   was done at the [facility] in [place], [place].  Patient reports that a small vein was taken from under his arm and a small incesion was made.   CHOLECYSTECTOMY     COLONOSCOPY     COLONOSCOPY WITH PROPOFOL N/A 11/25/2021   Procedure: COLONOSCOPY WITH PROPOFOL;  Surgeon: Jackquline Denmark, MD;  [facility];  Service: Gastroenterology;  Laterality: N/A;   ENDOVASCULAR REPAIR OF POPLITEAL ARTERY ANEURYSM Right 05/20/2022   Procedure: LIGATION OF POPLITEAL ARTERY ANEURYSM;  Surgeon: [name], MD;  Location: [place];  Service: Vascular;  Laterality: Right;   ESOPHAGOGASTRODUODENOSCOPY (EGD) WITH PROPOFOL N/A 11/25/2021   Procedure: ESOPHAGOGASTRODUODENOSCOPY (EGD) WITH PROPOFOL;  Surgeon: Jackquline Denmark, MD;  [facility];  Service: Gastroenterology;  Laterality: N/A;   FEMORAL-POPLITEAL BYPASS GRAFT Right 05/20/2022   Procedure: RIGHT ABOVE KNEE POPLITEAL ARTERY TO TIBIOPERONEAL TRUNK/POSTERIOR TIBIAL ARTERY BYPASS;  Surgeon: [name], MD;  Location: [place];  Service: Vascular;  Laterality: Right;   ICD IMPLANT N/A 01/30/2020   Procedure: ICD IMPLANT;  Surgeon: Rayann Heman,  Jeneen Rinks, MD;  Location: [place] [facility];  Service: Cardiovascular;  Laterality: N/A;   IR IMAGING GUIDED PORT INSERTION  12/04/2021   KNEE ARTHROPLASTY Left 08/05/2017   Procedure: LEFT TOTAL KNEE ARTHROPLASTY WITH COMPUTER NAVIGATION;  Surgeon: Rod Can, MD;  Location: [facility];  Service: Orthopedics;  Laterality: Left;  Needs RNFA   LIGATION OF CILIAC ARTERY Right 05/20/2022   Procedure: LIGATION OF RIGHT ANTERIOR TIBIAL ARTERY;  Surgeon: [name], MD;  Location: [place];  Service: Vascular;  Laterality: Right;   LOWER EXTREMITY ANGIOGRAPHY Right 05/14/2022    Procedure: Lower Extremity Angiography;  Surgeon: [name], MD;  Location: [place] [facility];  Service: Cardiovascular;  Laterality: Right;   PENILE PROSTHESIS IMPLANT     POLYPECTOMY  11/25/2021   Procedure: POLYPECTOMY;  Surgeon: Jackquline Denmark, MD;  [facility];  Service: Gastroenterology;;   REPLACEMENT TOTAL KNEE  06/12/2013   spinal cyst     THORACOTOMY     left anterior; wound exploration and debridement   TONSILLECTOMY AND ADENOIDECTOMY     age 80   VEIN HARVEST Right 05/20/2022   Procedure: RIGHT GREATER [place];  Surgeon: [name], MD;  Location: [place];  Service: Vascular;  Laterality: Right;   Allergies  Allergies  Allergen Reactions   Ace Inhibitors Cough   Statins Other (See Comments)    Muscle pains    History of Present Illness    76 year old male with above past medical history including CAD, chronic HFrEF, ischemic cardiomyopathy, hypertension, hyperlipidemia, LV thrombus on chronic warfarin, PAD, and lymphoma.  Suffered a myocardial infarction in 1998, and subsequently underwent minimally invasive CABG x 1 in 2008 with a LIMA to the LAD.  He has a long history of LV dysfunction with cardiac MRI in July 2020 showing an EF of approximately 30% with apical thrombus.  LV thrombus has been managed with warfarin.  Mild AI and MR noted as well.  In June 2021 he underwent ICD placement.  He underwent nuclear stress test in June 2022 which showed prior infarct without ischemia and an EF of 29%.  Echo in June 2023 showed stable LV dysfunction with an EF of 30 to 35% and mid-apical anteroseptal, inferoseptal, and apical akinesis with mild AI and trivial MR.  He has since been medically managed.    More recently, in the setting of right lower extremity claudication, has been evaluated by vascular surgery and found to have aneurysmal disease involving the popliteal extending to the anterior tibial with short segment occlusion of the  anterior tibial in the proximal calf with reconstitution.  The peroneal and posterior tibial occluded distally above the ankle.  He subsequently underwent right lower extremity bypass with exclusion of the popliteal aneurysm in mid October.  Since then, he has been experiencing right ankle and pedal edema and was seen in the office on December 6 secondary to bilateral right greater than left lower extremity edema and dyspnea.  He was felt to be mildly volume overloaded and was advised to increase his Lasix to 40 mg twice daily for 3 days and then reduce back down to 40 mg daily.  Since his last visit, he has noted resolution of left lower extremity swelling with ongoing mild right lower extremity swelling, which has not changed much since his surgery.  Dyspnea on exertion has improved.  His weight has been stable at home at 183 pounds on his scale.  He is up 293 pounds on our  scale but has on quite a bit of heavy clothing.  He denies chest pain, palpitations, PND, orthopnea, syncope, edema, or early satiety.  Though he is not experiencing vertiginous symptoms at rest, he continues to experience orthostatic lightheadedness, which is a long-term complaint.  Home Medications    Current Outpatient Medications  Medication Sig Dispense Refill   Ascorbic Acid (VITA-C PO) Take 1,000 mg by mouth daily.     aspirin EC 81 MG tablet Take 1 tablet (81 mg total) by mouth daily at 6 (six) AM. Swallow whole. 30 tablet 12   b complex vitamins capsule Take 1 capsule by mouth daily.     ezetimibe (ZETIA) 10 MG tablet TAKE 1 TABLET EVERY DAY (Patient taking differently: Take 10 mg by mouth daily.) 90 tablet 3   furosemide (LASIX) 20 MG tablet Take 2 tablets (40 mg total) by mouth daily. 180 tablet 3   lidocaine-prilocaine (EMLA) cream Apply a small amount to port a cath site (do not rub in) and cover with plastic wrap 1 hour prior to infusion appointments 30 g 3   meloxicam (MOBIC) 15 MG tablet Take 15 mg by mouth daily.      metoprolol succinate (TOPROL-XL) 50 MG 24 hr tablet Take 50 mg by mouth daily.     Multiple Vitamins-Minerals (MULTIVITAMIN WITH MINERALS) tablet Take 1 tablet by mouth daily. Centrum silver     rosuvastatin (CRESTOR) 5 MG tablet TAKE 1 TABLET EVERY OTHER DAY 45 tablet 10   warfarin (COUMADIN) 5 MG tablet Take 1 tablet daily except 1 1/2 tablets on Mondays or as directed. 90 tablet 5   ARTIFICIAL TEAR SOLUTION OP Place 1 drop into both eyes daily as needed (dry eyes). (Patient not taking: Reported on 07/29/2022)     empagliflozin (JARDIANCE) 10 MG TABS tablet Take 10 mg by mouth daily. (Patient not taking: Reported on 07/15/2022)     feeding supplement (ENSURE ENLIVE / ENSURE PLUS) LIQD Take 237 mLs by mouth 3 (three) times daily between meals. (Patient not taking: Reported on 07/29/2022) 237 mL 12   meclizine (ANTIVERT) 12.5 MG tablet Take 1 tablet (12.5 mg total) by mouth 3 (three) times daily as needed for dizziness. (Patient not taking: Reported on 07/29/2022) 30 tablet 1   oxyCODONE-acetaminophen (PERCOCET/ROXICET) 5-325 MG tablet Take 1 tablet by mouth every 6 (six) hours as needed for moderate pain. (Patient not taking: Reported on 07/29/2022) 20 tablet 0   potassium chloride SA (KLOR-CON M20) 20 MEQ tablet Take 1 tablet (20 mEq total) by mouth daily. (Patient not taking: Reported on 07/29/2022) 90 tablet 3   triamcinolone (NASACORT) 55 MCG/ACT AERO nasal inhaler Place 2 sprays into the nose daily. (Patient not taking: Reported on 07/29/2022) 3 Inhaler 3   No current facility-administered medications for this visit.     Review of Systems    Resolution of left lower extremity edema with slight improvement in right lower extremity edema and resolution of dyspnea on exertion.  He continues to experience orthostatic lightheadedness on an intermittent basis, depending on how quickly he gets up.  He denies chest pain, palpitations, PND, orthopnea, syncope, edema, or early satiety..  All other  systems reviewed and are otherwise negative except as noted above.    Physical Exam    VS:  BP 110/60   Pulse 60   Ht '5\' 11"'$  (1.803 m)   Wt 193 lb (87.5 kg)   SpO2 97%   BMI 26.92 kg/m  , BMI Body mass index is 26.92  kg/m.     GEN: Well nourished, well developed, in no acute distress. HEENT: normal. Neck: Supple, no JVD, carotid bruits, or masses. Cardiac: RRR, no murmurs, rubs, or gallops. No clubbing, cyanosis, 1+ right lower extremity edema.  No edema on the left.  Radials 2+/PT 2+ on the left and 1+ on the right.  Respiratory:  Respirations regular and unlabored, clear to auscultation bilaterally. GI: Soft, nontender, nondistended, BS + x 4. MS: no deformity or atrophy. Skin: warm and dry, no rash. Neuro:  Strength and sensation are intact. Psych: Normal affect.  Accessory Clinical Findings    Lab Results  Component Value Date   WBC 7.6 05/22/2022   HGB 8.7 (L) 05/22/2022   HCT 27.3 (L) 05/22/2022   MCV 95.8 05/22/2022   PLT 88 (L) 05/22/2022   Lab Results  Component Value Date   CREATININE 1.27 07/15/2022   BUN 30 (H) 07/15/2022   NA 141 07/15/2022   K 4.2 07/15/2022   CL 100 07/15/2022   CO2 26 07/15/2022   Lab Results  Component Value Date   ALT 17 05/20/2022   AST 21 05/20/2022   ALKPHOS 46 05/20/2022   BILITOT 0.9 05/20/2022   Lab Results  Component Value Date   CHOL 109 05/21/2022   HDL 29 (L) 05/21/2022   LDLCALC 65 05/21/2022   LDLDIRECT 102.0 10/09/2021   TRIG 73 05/21/2022   CHOLHDL 3.8 05/21/2022    Lab Results  Component Value Date   HGBA1C 5.2 05/20/2022   Lab Results  Component Value Date   INR 4.6 (A) 07/27/2022   INR 3.4 (A) 07/06/2022   INR 3.6 (A) 06/15/2022    Assessment & Plan    1.  Chronic heart failure with reduced ejection fraction/ischemic cardiomyopathy: History of LV dysfunction status post AICD in 2021.  Most recent echo showed an EF of 30 to 35% in June 2023.  Recently had right lower extremity bypass and  aneurysm surgery and noted right greater than left lower extremity swelling shortly thereafter.  He has also had elevated OptiVol readings dating back to September.  Seen in clinic 2 weeks ago and he was advised to increase his Lasix to 40 mg twice daily for 3 days and has since been on 40 mg daily.  He has noticed an improvement in his dyspnea on exertion with resolution of left lower extremity swelling.  He continues have 1+ right lower extremity swelling with plan for follow-up with vascular surgery.  GDMT has been historically limited by chronic orthostasis and he is only on Lasix 40 mg daily and Toprol-XL 50 mg daily.  No changes to medications today.  Previously did not tolerate Entresto, valsartan, or spironolactone.  He notes that he has been off of Jardiance for the past 3 months.  He was receiving it through the assistance program and says he has not gotten any in the mail x 3 months.  We will ask our pharmacists in [place] to look into this and hopefully get him renewed.  There does appear to be a temporal relationship between, and off of Jardiance and elevated OptiVol's.  He does not think he will be able to afford Jardiance in the absence of the assistance program.  2.  Coronary artery disease: Status post minimally invasive CABG x 1 with LIMA to the LAD in 2008.  Nonischemic Myoview in June 2022.  He has not been having any chest pain.  He remains on aspirin, statin, beta-blocker, and Zetia therapy.  3.  Essential hypertension/orthostatic hypotension: Overall stable.  4.  Hyperlipidemia: LDL of 65 in October with normal LFTs.  He remains on statin and Zetia therapy.  5.  LV apical thrombus: On chronic warfarin and followed here in our anticoagulation clinic.  He was supratherapeutic the other day at 4.6.  6.  Peripheral arterial disease: Ongoing right lower extremity swelling.  Postop duplex in November with patent right graft but concern for poor flow.  He is due for repeat studies in  February as well as vascular surgical follow-up at that time.  He remains on aspirin, statin, and Zetia.  7.  Disposition: Follow-up in 1 month or sooner if necessary.  Murray Hodgkins, NP 07/29/2022, 2:33 PM

## 2022-07-29 NOTE — Patient Instructions (Signed)
Medication Instructions:  Your physician recommends that you continue on your current medications as directed. Please refer to the Current Medication list given to you today.  *If you need a refill on your cardiac medications before your next appointment, please call your pharmacy*   Lab Work: NONE   If you have labs (blood work) drawn today and your tests are completely normal, you will receive your results only by: [place] (if you have MyChart) OR A paper copy in the mail If you have any lab test that is abnormal or we need to change your treatment, we will call you to review the results.   Testing/Procedures: NONE    Follow-Up: At [facility], you and your health needs are our priority.  As part of our continuing mission to provide you with exceptional heart care, we have created designated Provider Care Teams.  These Care Teams include your primary Cardiologist (physician) and Advanced Practice Providers (APPs -  Physician Assistants and Nurse Practitioners) who all work together to provide you with the care you need, when you need it.  We recommend signing up for the patient portal called "MyChart".  Sign up information is provided on this After Visit Summary.  MyChart is used to connect with patients for Virtual Visits (Telemedicine).  Patients are able to view lab/test results, encounter notes, upcoming appointments, etc.  Non-urgent messages can be sent to your provider as well.   To learn more about what you can do with MyChart, go to [email or web address].    Your next appointment:   1 month(s)  The format for your next appointment:   In Person  Provider:   Kirk Ruths, MD     Other Instructions Thank you for choosing [facility]!    Important Information About Sugar

## 2022-07-29 NOTE — Progress Notes (Unsigned)
Electrophysiology Office Note Date: 07/30/2022  ID:  [name], [name] 02/01/1946, MRN [id number]  PCP: Biagio Borg, MD Primary Cardiologist: Kirk Ruths, MD Electrophysiologist: Dr. Rayann Heman -> Melida Quitter, MD   CC: Routine ICD follow-up  [name] [name] is a 76 y.o. male seen today for Melida Quitter, MD for routine electrophysiology followup. Since last being seen in our clinic the patient reports doing OK overall. He has intermittent DOE with ADLs. Last occurred about a month ago. Now he is getting around pretty well despite persistently elevated optivol.  he denies chest pain, palpitations, PND, orthopnea, nausea, vomiting, dizziness, syncope, weight gain, or early satiety.    Device History: Medtronic Dual Chamber ICD implanted 01/2020 for CHF   Past Medical History:  Diagnosis Date   AICD (automatic cardioverter/defibrillator) present    ALLERGIC RHINITIS 10/31/2007   Qualifier: Diagnosis of  By: Jenny Reichmann MD, [place] Oris    Allergy    Cancer ([place]) 12/03/2021   non-Hodgkin's B-cell lymphoma   CHOLECYSTECTOMY, HX OF 06/04/2007   Qualifier: Diagnosis of  By: Danny Lawless CMA, [place]     Chronic HFrEF (heart failure with reduced ejection fraction) ([place])    COLONIC POLYPS, HX OF 10/31/2007   Qualifier: Diagnosis of  By: Jenny Reichmann MD, [place], HX OF 06/04/2007   Qualifier: Diagnosis of  By: [place], [place]     CORONARY ARTERY DISEASE 06/04/2007   Qualifier: Diagnosis of  By: Danny Lawless CMA, [place]     Diverticulosis    Erectile dysfunction    Eye twitch    right eye since chilhood    History of hiatal hernia    HYPERLIPIDEMIA 06/04/2007   Qualifier: Diagnosis of  By: [place], [place]     Hypersomnolence    HYPERTENSION 06/04/2007   Qualifier: Diagnosis of  By: [place], [place]     Hypothyroidism    ISCHEMIC CARDIOMYOPATHY 06/04/2007   Qualifier: Diagnosis of  By: [place], [place]     Ischemic cardiomyopathy    LV (left  ventricular) mural thrombus 02/15/2019   Myocardial infarction [facility])    per patient , his occurred in 1998    Osteoarthritis    Peripheral vascular disease ([place])    PLMD (periodic limb movement disorder)    Sleep apnea    no cpap' per patient , "i was checked for it and they said i didnt have it "    Vertigo    Vitamin D deficiency    Past Surgical History:  Procedure Laterality Date   BIOPSY  11/25/2021   Procedure: BIOPSY;  Surgeon: Jackquline Denmark, MD;  Location: [facility];  Service: Gastroenterology;;   CABG  1999   was done at the [facility] in [place], [place].  Patient reports that a small vein was taken from under his arm and a small incesion was made.   CHOLECYSTECTOMY     COLONOSCOPY     COLONOSCOPY WITH PROPOFOL N/A 11/25/2021   Procedure: COLONOSCOPY WITH PROPOFOL;  Surgeon: Jackquline Denmark, MD;  [facility];  Service: Gastroenterology;  Laterality: N/A;   ENDOVASCULAR REPAIR OF POPLITEAL ARTERY ANEURYSM Right 05/20/2022   Procedure: LIGATION OF POPLITEAL ARTERY ANEURYSM;  Surgeon: [name], MD;  Location: [place];  Service: Vascular;  Laterality: Right;   ESOPHAGOGASTRODUODENOSCOPY (EGD) WITH PROPOFOL N/A 11/25/2021   Procedure: ESOPHAGOGASTRODUODENOSCOPY (EGD) WITH PROPOFOL;  Surgeon: Jackquline Denmark, MD;  [facility];  Service: Gastroenterology;  Laterality: N/A;  FEMORAL-POPLITEAL BYPASS GRAFT Right 05/20/2022   Procedure: RIGHT ABOVE KNEE POPLITEAL ARTERY TO TIBIOPERONEAL TRUNK/POSTERIOR TIBIAL ARTERY BYPASS;  Surgeon: [name], MD;  Location: [place];  Service: Vascular;  Laterality: Right;   ICD IMPLANT N/A 01/30/2020   Procedure: ICD IMPLANT;  Surgeon: Thompson Grayer, MD;  Location: [place] [facility];  Service: Cardiovascular;  Laterality: N/A;   IR IMAGING GUIDED PORT INSERTION  12/04/2021   KNEE ARTHROPLASTY Left 08/05/2017   Procedure: LEFT TOTAL KNEE ARTHROPLASTY WITH COMPUTER NAVIGATION;  Surgeon: Rod Can, MD;  [facility];  Service: Orthopedics;  Laterality: Left;  Needs RNFA   LIGATION OF CILIAC ARTERY Right 05/20/2022   Procedure: LIGATION OF RIGHT ANTERIOR TIBIAL ARTERY;  Surgeon: [name], MD;  Location: [place];  Service: Vascular;  Laterality: Right;   LOWER EXTREMITY ANGIOGRAPHY Right 05/14/2022   Procedure: Lower Extremity Angiography;  Surgeon: [name], MD;  Location: [place] [facility];  Service: Cardiovascular;  Laterality: Right;   PENILE PROSTHESIS IMPLANT     POLYPECTOMY  11/25/2021   Procedure: POLYPECTOMY;  Surgeon: Jackquline Denmark, MD;  [facility];  Service: Gastroenterology;;   REPLACEMENT TOTAL KNEE  06/12/2013   spinal cyst     THORACOTOMY     left anterior; wound exploration and debridement   TONSILLECTOMY AND ADENOIDECTOMY     age 3   VEIN HARVEST Right 05/20/2022   Procedure: RIGHT GREATER [place];  Surgeon: [name], MD;  Location: [place];  Service: Vascular;  Laterality: Right;    Current Outpatient Medications  Medication Sig Dispense Refill   ARTIFICIAL TEAR SOLUTION OP Place 1 drop into both eyes daily as needed (dry eyes).     Ascorbic Acid (VITA-C PO) Take 1,000 mg by mouth daily.     aspirin EC 81 MG tablet Take 1 tablet (81 mg total) by mouth daily at 6 (six) AM. Swallow whole. 30 tablet 12   b complex vitamins capsule Take 1 capsule by mouth daily.     ezetimibe (ZETIA) 10 MG tablet TAKE 1 TABLET EVERY DAY (Patient taking differently: Take 10 mg by mouth daily.) 90 tablet 3   feeding supplement (ENSURE ENLIVE / ENSURE PLUS) LIQD Take 237 mLs by mouth 3 (three) times daily between meals. 237 mL 12   furosemide (LASIX) 20 MG tablet Take 2 tablets (40 mg total) by mouth daily. 180 tablet 3   meloxicam (MOBIC) 15 MG tablet Take 15 mg by mouth daily.     metoprolol succinate (TOPROL-XL) 50 MG 24 hr tablet Take 50 mg by mouth daily.     Multiple Vitamins-Minerals (MULTIVITAMIN WITH MINERALS)  tablet Take 1 tablet by mouth daily. Centrum silver     rosuvastatin (CRESTOR) 5 MG tablet TAKE 1 TABLET EVERY OTHER DAY 45 tablet 10   triamcinolone (NASACORT) 55 MCG/ACT AERO nasal inhaler Place 2 sprays into the nose daily. 3 Inhaler 3   warfarin (COUMADIN) 5 MG tablet Take 1 tablet daily except 1 1/2 tablets on Mondays or as directed. 90 tablet 5   No current facility-administered medications for this visit.    Allergies:   Ace inhibitors and Statins   Social History: Social History   Socioeconomic History   Marital status: Married    Spouse name: [name]   Number of children: 3   Years of education: Not on file   Highest education level: Not on file  Occupational History   Occupation: truck Education administrator:  [employer]  Tobacco Use   Smoking status: Former    Packs/day: 4.00    Years: 17.00    Total pack years: 68.00    Types: Cigarettes    Quit date: 05/07/1981    Years since quitting: 41.2    Passive exposure: Never   Smokeless tobacco: Never   Tobacco comments:    Pt states that he would let most of them "burn" pt states that he used anywhere between 4-5PPD  Vaping Use   Vaping Use: Never used  Substance and Sexual Activity   Alcohol use: Yes    Alcohol/week: 1.0 standard drink of alcohol    Types: 1 Cans of beer per week   Drug use: No   Sexual activity: Not on file  Other Topics Concern   Not on file  Social History Narrative   Lives in [place] with spouse   Retired Administrator   Social Determinants of Health   Financial Resource Strain: [place]  (10/27/2021)   Overall Financial Resource Strain (CARDIA)    Difficulty of Paying Living Expenses: Not hard at all  Food Insecurity: No Food Insecurity (05/22/2022)   Hunger Vital Sign    Worried About Running Out of Food in the Last Year: Never true    Ran Out of Food in the Last Year: Never true  Transportation Needs: No Transportation Needs (05/22/2022)   PRAPARE - Radiographer, therapeutic (Medical): No    Lack of Transportation (Non-Medical): No  Physical Activity: Inactive (10/27/2021)   Exercise Vital Sign    Days of Exercise per Week: 0 days    Minutes of Exercise per Session: 0 min  Stress: No Stress Concern Present (10/27/2021)   [place]    Feeling of Stress : Not at all  Social Connections: Moderately Isolated (10/27/2021)   Social Connection and Isolation Panel [NHANES]    Frequency of Communication with Friends and Family: More than three times a week    Frequency of Social Gatherings with Friends and Family: More than three times a week    Attends Religious Services: Never    Marine scientist or Organizations: No    Attends Archivist Meetings: Never    Marital Status: Married  Human resources officer Violence: Not At Risk (05/22/2022)   Humiliation, Afraid, Rape, and Kick questionnaire    Fear of Current or Ex-Partner: No    Emotionally Abused: No    Physically Abused: No    Sexually Abused: No    Family History: Family History  Problem Relation Age of Onset   Stomach cancer Mother        smokes   Lung cancer Father        chewed tobacco   Heart disease Brother        first MI at 68yo, now 31 for transplant list/ ICM   Pancreatic cancer Brother    Colon cancer Maternal Grandfather    COPD Son        was a smoker   Pancreatic cancer Maternal Uncle    Pancreatic cancer Cousin        mat side x 4   Esophageal cancer Neg Hx    Prostate cancer Neg Hx    Rectal cancer Neg Hx     Review of Systems: All other systems reviewed and are otherwise negative except as noted above.   Physical Exam: Vitals:   07/30/22 1101  BP: (!) 104/58  Pulse: (!) 52  SpO2: 98%  Weight: 195 lb (88.5 kg)  Height: '5\' 11"'$  (1.803 m)     GEN- The patient is well appearing, alert and oriented x 3 today.   HEENT: normocephalic, atraumatic; sclera clear, conjunctiva pink; hearing  intact; oropharynx clear; neck supple, no JVP Lymph- no cervical lymphadenopathy Lungs- Clear to ausculation bilaterally, normal work of breathing.  No wheezes, rales, rhonchi Heart- Regular  rate and rhythm, no murmurs, rubs or gallops, PMI not laterally displaced GI- soft, non-tender, non-distended, bowel sounds present, no hepatosplenomegaly Extremities- no clubbing or cyanosis. No peripheral edema; DP/PT/radial pulses 2+ bilaterally MS- no significant deformity or atrophy Skin- warm and dry, no rash or lesion; ICD pocket well healed Psych- euthymic mood, full affect Neuro- strength and sensation are intact  ICD interrogation- reviewed in detail today,  See PACEART report  EKG:  EKG is not ordered today.  Recent Labs: 10/09/2021: TSH 2.95 01/19/2022: B Natriuretic Peptide 200.0 05/12/2022: Magnesium 2.1 05/20/2022: ALT 17 05/22/2022: Hemoglobin 8.7; Platelets 88 07/15/2022: BUN 30; Creatinine, Ser 1.27; Potassium 4.2; Sodium 141   Wt Readings from Last 3 Encounters:  07/30/22 195 lb (88.5 kg)  07/29/22 193 lb (87.5 kg)  07/15/22 191 lb 8 oz (86.9 kg)     Other studies Reviewed: Additional studies/ records that were reviewed today include: Previous EP office notes.    Assessment and Plan:  1.  Chronic systolic dysfunction s/p Medtronic dual chamber ICD  Volume status looks OK on demand despite optivol. BMET and BNP today. He was having NYHA IIIb symptoms last month with SOB showering, but they have leveled out to more NYHA II-III, only with more moderate exertion. Stable on an appropriate medical regimen Normal ICD function See Pace Art report No changes today  2. HTN Stable on current regimen   3. CAD s/p CABG Myoview last year without ischemia No s/s of ischemia  4. PAD Follows with Dr. Carlis Abbott, VVS  Current medicines are reviewed at length with the patient today.    Labs/ tests ordered today include:  Orders Placed This Encounter  Procedures   Basic metabolic  panel   Pro b natriuretic peptide (BNP)   CUP PACEART INCLINIC DEVICE CHECK     Disposition:   Follow up with Dr. Myles Gip in 12 months. Gen cards as scheduled.     Jacalyn Lefevre, PA-C  07/30/2022 11:10 AM  [facility] [address] [place] [address] [phone] (office) [phone] (fax)

## 2022-07-30 ENCOUNTER — Encounter: Payer: Self-pay | Admitting: Student

## 2022-07-30 ENCOUNTER — Ambulatory Visit: Payer: Medicare HMO | Attending: Student | Admitting: Student

## 2022-07-30 VITALS — BP 104/58 | HR 52 | Ht 71.0 in | Wt 195.0 lb

## 2022-07-30 DIAGNOSIS — I2510 Atherosclerotic heart disease of native coronary artery without angina pectoris: Secondary | ICD-10-CM | POA: Diagnosis not present

## 2022-07-30 DIAGNOSIS — I5042 Chronic combined systolic (congestive) and diastolic (congestive) heart failure: Secondary | ICD-10-CM | POA: Diagnosis not present

## 2022-07-30 DIAGNOSIS — I724 Aneurysm of artery of lower extremity: Secondary | ICD-10-CM

## 2022-07-30 DIAGNOSIS — I10 Essential (primary) hypertension: Secondary | ICD-10-CM | POA: Diagnosis not present

## 2022-07-30 LAB — CUP PACEART INCLINIC DEVICE CHECK
Battery Remaining Longevity: 99 mo
Battery Voltage: 3.01 V
Brady Statistic AP VP Percent: 0.03 %
Brady Statistic AP VS Percent: 27.18 %
Brady Statistic AS VP Percent: 0.05 %
Brady Statistic AS VS Percent: 72.74 %
Brady Statistic RA Percent Paced: 25.71 %
Brady Statistic RV Percent Paced: 0.08 %
Date Time Interrogation Session: 20231221112824
HighPow Impedance: 64 Ohm
Implantable Lead Connection Status: 753985
Implantable Lead Connection Status: 753985
Implantable Lead Implant Date: 20210622
Implantable Lead Implant Date: 20210622
Implantable Lead Location: 753859
Implantable Lead Location: 753860
Implantable Lead Model: 5076
Implantable Pulse Generator Implant Date: 20210622
Lead Channel Impedance Value: 304 Ohm
Lead Channel Impedance Value: 399 Ohm
Lead Channel Impedance Value: 456 Ohm
Lead Channel Pacing Threshold Amplitude: 0.5 V
Lead Channel Pacing Threshold Amplitude: 0.625 V
Lead Channel Pacing Threshold Pulse Width: 0.4 ms
Lead Channel Pacing Threshold Pulse Width: 0.4 ms
Lead Channel Sensing Intrinsic Amplitude: 4.125 mV
Lead Channel Sensing Intrinsic Amplitude: 5.25 mV
Lead Channel Sensing Intrinsic Amplitude: 8.125 mV
Lead Channel Sensing Intrinsic Amplitude: 8.375 mV
Lead Channel Setting Pacing Amplitude: 1.5 V
Lead Channel Setting Pacing Amplitude: 2.5 V
Lead Channel Setting Pacing Pulse Width: 0.4 ms
Lead Channel Setting Sensing Sensitivity: 0.3 mV
Zone Setting Status: 755011

## 2022-07-30 NOTE — Patient Instructions (Signed)
Medication Instructions:  Your physician recommends that you continue on your current medications as directed. Please refer to the Current Medication list given to you today.  *If you need a refill on your cardiac medications before your next appointment, please call your pharmacy*   Lab Work: TODAY: BMET, BNP  If you have labs (blood work) drawn today and your tests are completely normal, you will receive your results only by: [place] (if you have MyChart) OR A paper copy in the mail If you have any lab test that is abnormal or we need to change your treatment, we will call you to review the results.   Follow-Up: At [facility], you and your health needs are our priority.  As part of our continuing mission to provide you with exceptional heart care, we have created designated Provider Care Teams.  These Care Teams include your primary Cardiologist (physician) and Advanced Practice Providers (APPs -  Physician Assistants and Nurse Practitioners) who all work together to provide you with the care you need, when you need it.   Your next appointment:   1 year(s)  The format for your next appointment:   In Person  Provider:   Doralee Albino, MD      Important Information About Sugar

## 2022-07-31 LAB — BASIC METABOLIC PANEL
BUN/Creatinine Ratio: 22 (ref 10–24)
BUN: 26 mg/dL (ref 8–27)
CO2: 27 mmol/L (ref 20–29)
Calcium: 9.1 mg/dL (ref 8.6–10.2)
Chloride: 100 mmol/L (ref 96–106)
Creatinine, Ser: 1.18 mg/dL (ref 0.76–1.27)
Glucose: 146 mg/dL — ABNORMAL HIGH (ref 70–99)
Potassium: 3.9 mmol/L (ref 3.5–5.2)
Sodium: 140 mmol/L (ref 134–144)
eGFR: 64 mL/min/{1.73_m2} (ref >59)

## 2022-07-31 LAB — PRO B NATRIURETIC PEPTIDE: NT-Pro BNP: 387 pg/mL (ref 0–486)

## 2022-08-06 ENCOUNTER — Other Ambulatory Visit: Payer: Self-pay | Admitting: Cardiology

## 2022-08-11 ENCOUNTER — Ambulatory Visit: Payer: Medicare HMO | Attending: Cardiology | Admitting: *Deleted

## 2022-08-11 ENCOUNTER — Other Ambulatory Visit: Payer: Self-pay

## 2022-08-11 ENCOUNTER — Telehealth: Payer: Self-pay | Admitting: *Deleted

## 2022-08-11 DIAGNOSIS — C8510 Unspecified B-cell lymphoma, unspecified site: Secondary | ICD-10-CM

## 2022-08-11 DIAGNOSIS — Z5181 Encounter for therapeutic drug level monitoring: Secondary | ICD-10-CM | POA: Diagnosis not present

## 2022-08-11 DIAGNOSIS — I240 Acute coronary thrombosis not resulting in myocardial infarction: Secondary | ICD-10-CM | POA: Diagnosis not present

## 2022-08-11 LAB — POCT INR: INR: 2.1 (ref 2.0–3.0)

## 2022-08-11 NOTE — Telephone Encounter (Signed)
Spoke with pt, aware the assistance program for jardiance will need to be redone for this year. The phone was dropped. Tried to call patient back and he did not answer. Jardiance application mailed to the patient.

## 2022-08-11 NOTE — Patient Instructions (Signed)
Pt started chemo 12/15/2021. Finished chemo Pt takes Prednisone 100 mg on the first 5 days of chemo treatment.  Continue warfarin 1 tablet daily except 1/2 tablet on Tuesdays Recheck in 3 wks

## 2022-08-11 NOTE — Telephone Encounter (Signed)
-----   Message from Rockne Menghini, Romeo sent at 07/31/2022  1:13 PM EST ----- Regarding: FW: Jardiance Can you look into this, pharmacy doesn't work with Patient assistance  thanks ----- Message ----- From: Levonne Hubert, LPN Sent: [birth year]   3:08 PM EST To: Rockne Menghini, RPH-CPP Subject: Bonita Quin afternoon, Ignacia Bayley ask that I ask you about patient assistance for Jardiance on this pt. He stopped taking it in September because the patient assistance stopped sending it. Thank you for your assistance with this.   Alda Berthold, LPN

## 2022-08-13 ENCOUNTER — Inpatient Hospital Stay: Payer: No Typology Code available for payment source | Attending: Hematology | Admitting: Hematology

## 2022-08-13 ENCOUNTER — Encounter: Payer: Self-pay | Admitting: Hematology

## 2022-08-13 ENCOUNTER — Inpatient Hospital Stay: Payer: No Typology Code available for payment source

## 2022-08-13 VITALS — BP 128/77 | HR 110 | Temp 97.2°F | Resp 20 | Ht 71.0 in | Wt 187.1 lb

## 2022-08-13 DIAGNOSIS — C8510 Unspecified B-cell lymphoma, unspecified site: Secondary | ICD-10-CM

## 2022-08-13 DIAGNOSIS — C8339 Diffuse large B-cell lymphoma, extranodal and solid organ sites: Secondary | ICD-10-CM | POA: Diagnosis present

## 2022-08-13 DIAGNOSIS — I110 Hypertensive heart disease with heart failure: Secondary | ICD-10-CM | POA: Diagnosis not present

## 2022-08-13 DIAGNOSIS — I255 Ischemic cardiomyopathy: Secondary | ICD-10-CM | POA: Diagnosis not present

## 2022-08-13 DIAGNOSIS — Z87891 Personal history of nicotine dependence: Secondary | ICD-10-CM | POA: Insufficient documentation

## 2022-08-13 DIAGNOSIS — Z95810 Presence of automatic (implantable) cardiac defibrillator: Secondary | ICD-10-CM | POA: Diagnosis not present

## 2022-08-13 DIAGNOSIS — I5022 Chronic systolic (congestive) heart failure: Secondary | ICD-10-CM | POA: Diagnosis not present

## 2022-08-13 DIAGNOSIS — Z800 Family history of malignant neoplasm of digestive organs: Secondary | ICD-10-CM | POA: Diagnosis not present

## 2022-08-13 DIAGNOSIS — Z801 Family history of malignant neoplasm of trachea, bronchus and lung: Secondary | ICD-10-CM | POA: Insufficient documentation

## 2022-08-13 DIAGNOSIS — Z95828 Presence of other vascular implants and grafts: Secondary | ICD-10-CM

## 2022-08-13 LAB — COMPREHENSIVE METABOLIC PANEL
ALT: 14 U/L (ref 0–44)
AST: 19 U/L (ref 15–41)
Albumin: 3.8 g/dL (ref 3.5–5.0)
Alkaline Phosphatase: 61 U/L (ref 38–126)
Anion gap: 9 (ref 5–15)
BUN: 21 mg/dL (ref 8–23)
CO2: 28 mmol/L (ref 22–32)
Calcium: 8.6 mg/dL — ABNORMAL LOW (ref 8.9–10.3)
Chloride: 101 mmol/L (ref 98–111)
Creatinine, Ser: 1.04 mg/dL (ref 0.61–1.24)
GFR, Estimated: >60 mL/min (ref >60)
Glucose, Bld: 116 mg/dL — ABNORMAL HIGH (ref 70–99)
Potassium: 3.5 mmol/L (ref 3.5–5.1)
Sodium: 138 mmol/L (ref 135–145)
Total Bilirubin: 1 mg/dL (ref 0.3–1.2)
Total Protein: 6.8 g/dL (ref 6.5–8.1)

## 2022-08-13 LAB — CBC WITH DIFFERENTIAL/PLATELET
Abs Immature Granulocytes: 0.01 10*3/uL (ref 0.00–0.07)
Basophils Absolute: 0 10*3/uL (ref 0.0–0.1)
Basophils Relative: 1 %
Eosinophils Absolute: 0.2 10*3/uL (ref 0.0–0.5)
Eosinophils Relative: 4 %
HCT: 40.3 % (ref 39.0–52.0)
Hemoglobin: 12.9 g/dL — ABNORMAL LOW (ref 13.0–17.0)
Immature Granulocytes: 0 %
Lymphocytes Relative: 24 %
Lymphs Abs: 1.4 10*3/uL (ref 0.7–4.0)
MCH: 29.3 pg (ref 26.0–34.0)
MCHC: 32 g/dL (ref 30.0–36.0)
MCV: 91.6 fL (ref 80.0–100.0)
Monocytes Absolute: 0.4 10*3/uL (ref 0.1–1.0)
Monocytes Relative: 8 %
Neutro Abs: 3.7 10*3/uL (ref 1.7–7.7)
Neutrophils Relative %: 63 %
Platelets: 137 10*3/uL — ABNORMAL LOW (ref 150–400)
RBC: 4.4 MIL/uL (ref 4.22–5.81)
RDW: 13.7 % (ref 11.5–15.5)
WBC: 5.7 10*3/uL (ref 4.0–10.5)
nRBC: 0 % (ref 0.0–0.2)

## 2022-08-13 LAB — MAGNESIUM: Magnesium: 1.9 mg/dL (ref 1.7–2.4)

## 2022-08-13 LAB — LACTATE DEHYDROGENASE: LDH: 93 U/L — ABNORMAL LOW (ref 98–192)

## 2022-08-13 MED ORDER — SODIUM CHLORIDE 0.9% FLUSH
10.0000 mL | INTRAVENOUS | Status: DC | PRN
  Administered 2022-08-13: 10 mL via INTRAVENOUS

## 2022-08-13 MED ORDER — HEPARIN SOD (PORK) LOCK FLUSH 100 UNIT/ML IV SOLN
500.0000 [IU] | Freq: Once | INTRAVENOUS | Status: AC
  Administered 2022-08-13: 500 [IU] via INTRAVENOUS

## 2022-08-13 MED ORDER — SODIUM CHLORIDE 0.9% FLUSH: 10.0000 mL | INTRAVENOUS | Status: AC

## 2022-08-13 NOTE — Progress Notes (Signed)
[name] [name], [place] [address]   CLINIC:  Medical Oncology/Hematology  PCP:  Biagio Borg, MD [place] / [place] [address] [phone]   REASON FOR VISIT:  Follow-up for stage IVb high-grade B-cell lymphoma  PRIOR THERAPY: R-CEOP q21d x 6 Cycles  NGS Results: not done  CURRENT THERAPY: Surveillance  BRIEF ONCOLOGIC HISTORY:  Oncology History  High grade B-cell lymphoma ([place])  12/03/2021 Initial Diagnosis   High grade B-cell lymphoma ([place])   12/15/2021 - 04/03/2022 Chemotherapy   Patient is on Treatment Plan : NON-HODGKIN'S LYMPHOMA R-CEOP q21d x 3 Cycles       CANCER STAGING:  Cancer Staging  High grade B-cell lymphoma ([place]) Staging form: Hodgkin and Non-Hodgkin Lymphoma, AJCC 8th Edition - Clinical stage from 12/03/2021: Stage IV (Diffuse large B-cell lymphoma) - Unsigned   INTERVAL HISTORY:  [name] [name], a 77 y.o. male, seen for follow-up of high-grade non-Hodgkin's lymphoma.  He denies any fevers, night sweats or weight loss.  He reported recent fatigue since he had upper respiratory infection.  REVIEW OF SYSTEMS:  Review of Systems  Respiratory:  Positive for cough and shortness of breath.   Psychiatric/Behavioral:  Positive for sleep disturbance.   All other systems reviewed and are negative.   PAST MEDICAL/SURGICAL HISTORY:  Past Medical History:  Diagnosis Date   AICD (automatic cardioverter/defibrillator) present    ALLERGIC RHINITIS 10/31/2007   Qualifier: Diagnosis of  By: Jenny Reichmann MD, [place]    Allergy    Cancer ([place]) 12/03/2021   non-Hodgkin's B-cell lymphoma   CHOLECYSTECTOMY, HX OF 06/04/2007   Qualifier: Diagnosis of  By: Danny Lawless CMA, [place]     Chronic HFrEF (heart failure with reduced ejection fraction) ([place])    COLONIC POLYPS, HX OF 10/31/2007   Qualifier: Diagnosis of  By: Jenny Reichmann MD, [place], HX OF 06/04/2007   Qualifier: Diagnosis of  By: [place], [place]     CORONARY ARTERY DISEASE 06/04/2007   Qualifier: Diagnosis of  By: Danny Lawless CMA, [place]     Diverticulosis    Erectile dysfunction    Eye twitch    right eye since chilhood    History of hiatal hernia    HYPERLIPIDEMIA 06/04/2007   Qualifier: Diagnosis of  By: [place], [place]     Hypersomnolence    HYPERTENSION 06/04/2007   Qualifier: Diagnosis of  By: [place], [place]     Hypothyroidism    ISCHEMIC CARDIOMYOPATHY 06/04/2007   Qualifier: Diagnosis of  By: [place], [place]     Ischemic cardiomyopathy    LV (left ventricular) mural thrombus 02/15/2019   Myocardial infarction [facility])    per patient , his occurred in 1998    Osteoarthritis    Peripheral vascular disease ([place])    PLMD (periodic limb movement disorder)    Sleep apnea    no cpap' per patient , "i was checked for it and they said i didnt have it "    Vertigo    Vitamin D deficiency    Past Surgical History:  Procedure Laterality Date   BIOPSY  11/25/2021   Procedure: BIOPSY;  Surgeon: Jackquline Denmark, MD;  [facility]: [facility];  Service: Gastroenterology;;   CABG  1999   was done at the [facility] in [place], [place].  Patient reports that a small vein was taken from under his arm and a small incesion was made.  CHOLECYSTECTOMY     COLONOSCOPY     COLONOSCOPY WITH PROPOFOL N/A 11/25/2021   Procedure: COLONOSCOPY WITH PROPOFOL;  Surgeon: Jackquline Denmark, MD;  [facility];  Service: Gastroenterology;  Laterality: N/A;   ENDOVASCULAR REPAIR OF POPLITEAL ARTERY ANEURYSM Right 05/20/2022   Procedure: LIGATION OF POPLITEAL ARTERY ANEURYSM;  Surgeon: [name], MD;  Location: [facility];  Service: Vascular;  Laterality: Right;   ESOPHAGOGASTRODUODENOSCOPY (EGD) WITH PROPOFOL N/A 11/25/2021   Procedure: ESOPHAGOGASTRODUODENOSCOPY (EGD) WITH PROPOFOL;  Surgeon: Jackquline Denmark, MD;  [facility];  Service: Gastroenterology;  Laterality: N/A;   FEMORAL-POPLITEAL BYPASS  GRAFT Right 05/20/2022   Procedure: RIGHT ABOVE KNEE POPLITEAL ARTERY TO TIBIOPERONEAL TRUNK/POSTERIOR TIBIAL ARTERY BYPASS;  Surgeon: [name], MD;  Location: [facility];  Service: Vascular;  Laterality: Right;   ICD IMPLANT N/A 01/30/2020   Procedure: ICD IMPLANT;  Surgeon: Thompson Grayer, MD;  Location: [facility];  Service: Cardiovascular;  Laterality: N/A;   IR IMAGING GUIDED PORT INSERTION  12/04/2021   KNEE ARTHROPLASTY Left 08/05/2017   Procedure: LEFT TOTAL KNEE ARTHROPLASTY WITH COMPUTER NAVIGATION;  Surgeon: Rod Can, MD;  [facility];  Service: Orthopedics;  Laterality: Left;  Needs RNFA   LIGATION OF CILIAC ARTERY Right 05/20/2022   Procedure: LIGATION OF RIGHT ANTERIOR TIBIAL ARTERY;  Surgeon: [name], MD;  Location: [facility];  Service: Vascular;  Laterality: Right;   LOWER EXTREMITY ANGIOGRAPHY Right 05/14/2022   Procedure: Lower Extremity Angiography;  Surgeon: [name], MD;  Location: [facility];  Service: Cardiovascular;  Laterality: Right;   PENILE PROSTHESIS IMPLANT     POLYPECTOMY  11/25/2021   Procedure: POLYPECTOMY;  Surgeon: Jackquline Denmark, MD;  [facility];  Service: Gastroenterology;;   REPLACEMENT TOTAL KNEE  06/12/2013   spinal cyst     THORACOTOMY     left anterior; wound exploration and debridement   TONSILLECTOMY AND ADENOIDECTOMY     age 47   VEIN HARVEST Right 05/20/2022   Procedure: [place];  Surgeon: [name], MD;  [facility];  Service: Vascular;  Laterality: Right;    SOCIAL HISTORY:  Social History   Socioeconomic History   Marital status: Married    Spouse name: [name]   Number of children: 3   Years of education: Not on file   Highest education level: Not on file  Occupational History   Occupation: truck Education administrator: [name]  Tobacco Use   Smoking status: Former    Packs/day: 4.00    Years: 17.00    Total pack  years: 68.00    Types: Cigarettes    Quit date: 05/07/1981    Years since quitting: 41.2    Passive exposure: Never   Smokeless tobacco: Never   Tobacco comments:    Pt states that he would let most of them "burn" pt states that he used anywhere between 4-5PPD  Vaping Use   Vaping Use: Never used  Substance and Sexual Activity   Alcohol use: Yes    Alcohol/week: 1.0 standard drink of alcohol    Types: 1 Cans of beer per week   Drug use: No   Sexual activity: Not on file  Other Topics Concern   Not on file  Social History Narrative   Lives in [place] with spouse   Retired Administrator   Social Determinants of [place] Strain: [place]  (10/27/2021)  Overall Financial Resource Strain (CARDIA)    Difficulty of Paying Living Expenses: Not hard at all  Food Insecurity: No Food Insecurity (05/22/2022)   Hunger Vital Sign    Worried About Running Out of Food in the Last Year: Never true    Ran Out of Food in the Last Year: Never true  Transportation Needs: No Transportation Needs (05/22/2022)   PRAPARE - Hydrologist (Medical): No    Lack of Transportation (Non-Medical): No  Physical Activity: Inactive (10/27/2021)   Exercise Vital Sign    Days of Exercise per Week: 0 days    Minutes of Exercise per Session: 0 min  Stress: No Stress Concern Present (10/27/2021)   [place]    Feeling of Stress : Not at all  Social Connections: Moderately Isolated (10/27/2021)   Social Connection and Isolation Panel [NHANES]    Frequency of Communication with Friends and Family: More than three times a week    Frequency of Social Gatherings with Friends and Family: More than three times a week    Attends Religious Services: Never    Marine scientist or Organizations: No    Attends Archivist Meetings: Never    Marital Status: Married  Human resources officer Violence:  Not At Risk (05/22/2022)   Humiliation, Afraid, Rape, and Kick questionnaire    Fear of Current or Ex-Partner: No    Emotionally Abused: No    Physically Abused: No    Sexually Abused: No    FAMILY HISTORY:  Family History  Problem Relation Age of Onset   Stomach cancer Mother        smokes   Lung cancer Father        chewed tobacco   Heart disease Brother        first MI at 8yo, now 74 for transplant list/ ICM   Pancreatic cancer Brother    Colon cancer Maternal Grandfather    COPD Son        was a smoker   Pancreatic cancer Maternal Uncle    Pancreatic cancer Cousin        mat side x 4   Esophageal cancer Neg Hx    Prostate cancer Neg Hx    Rectal cancer Neg Hx     CURRENT MEDICATIONS:  Current Outpatient Medications  Medication Sig Dispense Refill   ARTIFICIAL TEAR SOLUTION OP Place 1 drop into both eyes daily as needed (dry eyes).     Ascorbic Acid (VITA-C PO) Take 1,000 mg by mouth daily.     aspirin EC 81 MG tablet Take 1 tablet (81 mg total) by mouth daily at 6 (six) AM. Swallow whole. 30 tablet 12   b complex vitamins capsule Take 1 capsule by mouth daily.     ezetimibe (ZETIA) 10 MG tablet TAKE 1 TABLET EVERY DAY (Patient taking differently: Take 10 mg by mouth daily.) 90 tablet 3   feeding supplement (ENSURE ENLIVE / ENSURE PLUS) LIQD Take 237 mLs by mouth 3 (three) times daily between meals. 237 mL 12   furosemide (LASIX) 20 MG tablet Take 2 tablets (40 mg total) by mouth daily. 180 tablet 3   meloxicam (MOBIC) 15 MG tablet Take 15 mg by mouth daily.     metoprolol succinate (TOPROL-XL) 50 MG 24 hr tablet Take 50 mg by mouth daily.     Multiple Vitamins-Minerals (MULTIVITAMIN WITH MINERALS) tablet Take 1 tablet by mouth daily.  Centrum silver     rosuvastatin (CRESTOR) 5 MG tablet TAKE 1 TABLET EVERY OTHER DAY 45 tablet 3   triamcinolone (NASACORT) 55 MCG/ACT AERO nasal inhaler Place 2 sprays into the nose daily. 3 Inhaler 3   warfarin (COUMADIN) 5 MG tablet  Take 1 tablet daily except 1 1/2 tablets on Mondays or as directed. 90 tablet 5   No current facility-administered medications for this visit.    ALLERGIES:  Allergies  Allergen Reactions   Ace Inhibitors Cough   Statins Other (See Comments)    Muscle pains    PHYSICAL EXAM:  Performance status (ECOG): 0 - Asymptomatic  There were no vitals filed for this visit. Wt Readings from Last 3 Encounters:  08/13/22 187 lb 1.6 oz (84.9 kg)  07/30/22 195 lb (88.5 kg)  07/29/22 193 lb (87.5 kg)   Physical Exam Vitals reviewed.  Constitutional:      Appearance: Normal appearance.  Cardiovascular:     Rate and Rhythm: Normal rate and regular rhythm.     Pulses: Normal pulses.     Heart sounds: Normal heart sounds.  Pulmonary:     Effort: Pulmonary effort is normal.     Breath sounds: Normal breath sounds.  Neurological:     General: No focal deficit present.     Mental Status: He is alert and oriented to person, place, and time.  Psychiatric:        Mood and Affect: Mood normal.        Behavior: Behavior normal.     LABORATORY DATA:  I have reviewed the labs as listed.     Latest Ref Rng & Units 08/13/2022   10:13 AM 05/22/2022    2:04 AM 05/21/2022    4:22 AM  CBC  WBC 4.0 - 10.5 K/uL 5.7  7.6  6.6   Hemoglobin 13.0 - 17.0 g/dL 12.9  8.7  9.0   Hematocrit 39.0 - 52.0 % 40.3  27.3  26.6   Platelets 150 - 400 K/uL 137  88  85       Latest Ref Rng & Units 08/13/2022   10:13 AM 07/30/2022   11:42 AM 07/15/2022   10:17 AM  CMP  Glucose 70 - 99 mg/dL 116  146  98   BUN 8 - 23 mg/dL _0 Creatinine 0.61 - 1.24 mg/dL 1.04  1.18  1.27   Sodium 135 - 145 mmol/L 138  140  141   Potassium 3.5 - 5.1 mmol/L 3.5  3.9  4.2   Chloride 98 - 111 mmol/L 101  100  100   CO2 22 - 32 mmol/L _1 Calcium 8.9 - 10.3 mg/dL 8.6  9.1  9.5   Total Protein 6.5 - 8.1 g/dL 6.8     Total Bilirubin 0.3 - 1.2 mg/dL 1.0     Alkaline Phos 38 - 126 U/L 61     AST 15 - 41 U/L 19      ALT 0 - 44 U/L 14       DIAGNOSTIC IMAGING:  I have independently reviewed the scans and discussed with the patient. CUP PACEART INCLINIC DEVICE CHECK  Result Date: 07/30/2022 ICD check in clinic. Normal device function. Thresholds and sensing consistent with previous device measurements. Impedance trends stable over time. 1 NSVT noted. Histogram distribution appropriate for patient and level of activity. No changes made this session. Device programmed at appropriate safety margins. Leanne Lovely has been  persistently elevated since October, unclear how accurate. Labs today.  Estimated longevity 59yr35mo. Pt enrolled in remote follow-up. Patient education completed including shock plan.  CUP PACEART REMOTE DEVICE CHECK  Result Date: 07/28/2022 Scheduled remote reviewed. Normal device function.  Optivol fluid index peaked since 1st of October, secondary indicator (fluid index) continues to trend below daily reference. Followed by HF. Next remote 91 days- JJB    ASSESSMENT:  Stage IVb high-grade B-cell lymphoma: - He had unintentional weight loss of 20 pounds since September 2022.  Weight has been stable for the last 2 months.  He had multiple family members with pancreatic cancer. - CT AP with contrast (10/31/2021): Abnormal nodular thickening of the right anterior pararenal fascia, suspicious for atypical malignancy.  No evidence of pancreatic malignancy.  Irregular wall thickening of the proximal stomach.  No ascites or peritoneal nodularity. - Biopsy of nodular thickening (11/17/2021): High-grade B-cell lymphoma with Burkitt-like features.  Ki-67 100%. 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  Neoplastic lymphocytes positive for CD20, CD10, Bcl-2, BCL6 and mum 1.  Ki-67 more than 95%.  H pylori IHC negative. - High-grade lymphoma panel (11/17/2021): Negative for BCL6, MYC, BCL2 rearrangements, negative for MYC  amplification, t(8:14) not detected. - High risk features for CNS disease: Age more than 659 stage III/IV, extranodal involvement more than 1 site.  Based on 3 points, intermediate risk.  No clinical signs of CNS involvement. - IPI: High intermediate with 3 points (age more than 60, stage III/IV, extranodal involvement more than 1 site) - 6 cycles of R-CEOP from 12/15/2021 through 03/31/2022 - PET scan (04/30/2022): No evidence of lymphoma ([place] 1 response).    Social/family history: - He has AICD placed in June 2021 secondary to CHF. 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  He quit smoking more than 40 years ago.  He drinks mostly beer and occasionally whiskey at bedtime.    Ischemic cardiomyopathy/CHF/apical thrombus: - He is on Coumadin.  Will check PT/INR today. - Last seen by Dr. CStanford Breedon 11/06/2021.   -Last echo on 12/27/2020 with EF 30-35%.  Limited visualization of endocardium.  LV has moderately decreased function.   PLAN:  Stage IVb high-grade B-cell lymphoma: -He does not have any B symptoms. - Physical exam did not show any adenopathy or splenomegaly. - Labs today shows normal LFTs, LDH and near normal CBC. - RTC 3 months for follow-up.  Will repeat PET scan prior to next visit along with labs.  If PET scan is normal, we will just monitor with labs and physical exam.       Orders placed this encounter:  Orders Placed This Encounter  Procedures   NM PET Image Restag (PS) Skull Base To Thigh      SDerek Jack MD [phone]

## 2022-08-13 NOTE — Progress Notes (Signed)
[name] presented for Portacath access and flush. Portacath located right  chest wall accessed with  H 20 needle. Good blood return present. Portacath flushed with 15m NS and 500U/567mHeparin and needle removed intact. Procedure without incident. Patient tolerated procedure well.

## 2022-08-13 NOTE — Patient Instructions (Signed)
[facility]  Discharge Instructions: Thank you for choosing [facility] to provide your oncology and hematology care.  If you have a lab appointment with the [facility], please come in thru the Main Entrance and check in at the main information desk.  Wear comfortable clothing and clothing appropriate for easy access to any Portacath or PICC line.   We strive to give you quality time with your provider. You may need to reschedule your appointment if you arrive late (15 or more minutes).  Arriving late affects you and other patients whose appointments are after yours.  Also, if you miss three or more appointments without notifying the office, you may be dismissed from the clinic at the provider's discretion.      For prescription refill requests, have your pharmacy contact our office and allow 72 hours for refills to be completed.    Today you had your port flushed with labs done      To help prevent nausea and vomiting after your treatment, we encourage you to take your nausea medication as directed.  BELOW ARE SYMPTOMS THAT SHOULD BE REPORTED IMMEDIATELY: *FEVER GREATER THAN 100.4 F (38 C) OR HIGHER *CHILLS OR SWEATING *NAUSEA AND VOMITING THAT IS NOT CONTROLLED WITH YOUR NAUSEA MEDICATION *UNUSUAL SHORTNESS OF BREATH *UNUSUAL BRUISING OR BLEEDING *URINARY PROBLEMS (pain or burning when urinating, or frequent urination) *BOWEL PROBLEMS (unusual diarrhea, constipation, pain near the anus) TENDERNESS IN MOUTH AND THROAT WITH OR WITHOUT PRESENCE OF ULCERS (sore throat, sores in mouth, or a toothache) UNUSUAL RASH, SWELLING OR PAIN  UNUSUAL VAGINAL DISCHARGE OR ITCHING   Items with * indicate a potential emergency and should be followed up as soon as possible or go to the Emergency Department if any problems should occur.  Please show the CHEMOTHERAPY ALERT CARD or IMMUNOTHERAPY ALERT CARD at check-in to the Emergency Department and triage  nurse.  Should you have questions after your visit or need to cancel or reschedule your appointment, please contact [facility] [phone]  and follow the prompts.  Office hours are 8:00 a.m. to 4:30 p.m. Monday - Friday. Please note that voicemails left after 4:00 p.m. may not be returned until the following business day.  We are closed weekends and major holidays. You have access to a nurse at all times for urgent questions. Please call the main number to the clinic [phone] and follow the prompts.  For any non-urgent questions, you may also contact your provider using MyChart. We now offer e-Visits for anyone 61 and older to request care online for non-urgent symptoms. For details visit [email or web address].   Also download the MyChart app! Go to the app store, search "MyChart", open the app, select [facility], and log in with your MyChart username and password.

## 2022-08-13 NOTE — Patient Instructions (Signed)
[facility] at [facility] Discharge Instructions   You were seen and examined today by Dr. Delton Coombes.  He reviewed the results of your lab work which are normal/stable.   We will see you back in 3 months. We will repeat a scan prior to you next visit.    Thank you for choosing [facility] at [facility] to provide your oncology and hematology care.  To afford each patient quality time with our provider, please arrive at least 15 minutes before your scheduled appointment time.   If you have a lab appointment with the [facility] please come in thru the Main Entrance and check in at the main information desk.  You need to re-schedule your appointment should you arrive 10 or more minutes late.  We strive to give you quality time with our providers, and arriving late affects you and other patients whose appointments are after yours.  Also, if you no show three or more times for appointments you may be dismissed from the clinic at the providers discretion.     Again, thank you for choosing [facility].  Our hope is that these requests will decrease the amount of time that you wait before being seen by our physicians.       _____________________________________________________________  Should you have questions after your visit to [facility], please contact our office at [phone] and follow the prompts.  Our office hours are 8:00 a.m. and 4:30 p.m. Monday - Friday.  Please note that voicemails left after 4:00 p.m. may not be returned until the following business day.  We are closed weekends and major holidays.  You do have access to a nurse 24-7, just call the main number to the clinic [phone] and do not press any options, hold on the line and a nurse will answer the phone.    For prescription refill requests, have your pharmacy contact our office and allow 72 hours.    Due to Covid, you will need to wear a mask  upon entering the hospital. If you do not have a mask, a mask will be given to you at the Main Entrance upon arrival. For doctor visits, patients may have 1 support person age 50 or older with them. For treatment visits, patients can not have anyone with them due to social distancing guidelines and our immunocompromised population.

## 2022-08-17 NOTE — Progress Notes (Signed)
HPI: FU CAD; history dates back to 1998 when he had a myocardial infarction. He subsequently underwent minimally invasive LIMA to his LAD. Cardiac MRI July 2020 showed ejection fraction approximately 30% and apical thrombus noted. Mild aortic and mitral regurgitation. Had ICD placed June 2021. Nuclear study June 2022 showed ejection fraction 29% and infarct but no ischemia.  Echocardiogram June 2023 showed ejection fraction 30 to 35%, akinesis of the apex, anteroseptal and inferoseptal walls, moderate left atrial enlargement, mild aortic insufficiency, mild aortic stenosis with aortic valve area 1.53 cm.  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  Patient subsequently underwent right above-the-knee popliteal artery to tibioperoneal trunk/posterior tibial artery bypass with saphenous vein graft and ligation of right popliteal artery aneurysm October 2023.  Since I last saw him, he has dyspnea with more vigorous activities but not routine activities.  No orthopnea, PND, pedal edema, chest pain or syncope.  Current Outpatient Medications  Medication Sig Dispense Refill   ARTIFICIAL TEAR SOLUTION OP Place 1 drop into both eyes daily as needed (dry eyes).     Ascorbic Acid (VITA-C PO) Take 1,000 mg by mouth daily.     aspirin EC 81 MG tablet Take 1 tablet (81 mg total) by mouth daily at 6 (six) AM. Swallow whole. 30 tablet 12   b complex vitamins capsule Take 1 capsule by mouth daily.     ezetimibe (ZETIA) 10 MG tablet TAKE 1 TABLET EVERY DAY (Patient taking differently: Take 10 mg by mouth daily.) 90 tablet 3   feeding supplement (ENSURE ENLIVE / ENSURE PLUS) LIQD Take 237 mLs by mouth 3 (three) times daily between meals. 237 mL 12   furosemide (LASIX) 20 MG tablet Take 2 tablets (40 mg total) by mouth daily. 180 tablet 3   meloxicam (MOBIC) 15 MG tablet  Take 15 mg by mouth daily.     metoprolol succinate (TOPROL-XL) 50 MG 24 hr tablet Take 50 mg by mouth daily.     Multiple Vitamins-Minerals (MULTIVITAMIN WITH MINERALS) tablet Take 1 tablet by mouth daily. Centrum silver     rosuvastatin (CRESTOR) 5 MG tablet TAKE 1 TABLET EVERY OTHER DAY 45 tablet 3   triamcinolone (NASACORT) 55 MCG/ACT AERO nasal inhaler Place 2 sprays into the nose daily. 3 Inhaler 3   warfarin (COUMADIN) 5 MG tablet Take 1 tablet daily except 1 1/2 tablets on Mondays or as directed. 90 tablet 5   No current facility-administered medications for this visit.     Past Medical History:  Diagnosis Date   AICD (automatic cardioverter/defibrillator) present    ALLERGIC RHINITIS 10/31/2007   Qualifier: Diagnosis of  By: Jenny Reichmann MD, [place]    Allergy    Cancer ([place]) 12/03/2021   non-Hodgkin's B-cell lymphoma   CHOLECYSTECTOMY, HX OF 06/04/2007   Qualifier: Diagnosis of  By: Danny Lawless CMA, [place]     Chronic HFrEF (heart failure with reduced ejection fraction) ([place])    COLONIC POLYPS, HX OF 10/31/2007   Qualifier: Diagnosis of  By: Jenny Reichmann MD, [employer], HX OF 06/04/2007   Qualifier: Diagnosis of  By: Danny Lawless CMA, [place]     CORONARY ARTERY DISEASE 06/04/2007   Qualifier: Diagnosis of  By: Danny Lawless CMA, [place]     Diverticulosis    Erectile dysfunction    Eye twitch  right eye since chilhood    History of hiatal hernia    HYPERLIPIDEMIA 06/04/2007   Qualifier: Diagnosis of  By: [place], [place]     Hypersomnolence    HYPERTENSION 06/04/2007   Qualifier: Diagnosis of  By: [place], [place]     Hypothyroidism    ISCHEMIC CARDIOMYOPATHY 06/04/2007   Qualifier: Diagnosis of  By: [place], [place]     Ischemic cardiomyopathy    LV (left ventricular) mural thrombus 02/15/2019   Myocardial infarction [facility])    per patient , his occurred in 1998    Osteoarthritis    Peripheral vascular disease ([place])    PLMD (periodic limb movement  disorder)    Sleep apnea    no cpap' per patient , "i was checked for it and they said i didnt have it "    Vertigo    Vitamin D deficiency     Past Surgical History:  Procedure Laterality Date   BIOPSY  11/25/2021   Procedure: BIOPSY;  Surgeon: Jackquline Denmark, MD;  Location: [facility];  Service: Gastroenterology;;   CABG  1999   was done at the [facility] in [place], [place].  Patient reports that a small vein was taken from under his arm and a small incesion was made.   CHOLECYSTECTOMY     COLONOSCOPY     COLONOSCOPY WITH PROPOFOL N/A 11/25/2021   Procedure: COLONOSCOPY WITH PROPOFOL;  Surgeon: Jackquline Denmark, MD;  [facility];  Service: Gastroenterology;  Laterality: N/A;   ENDOVASCULAR REPAIR OF POPLITEAL ARTERY ANEURYSM Right 05/20/2022   Procedure: LIGATION OF POPLITEAL ARTERY ANEURYSM;  Surgeon: [name], MD;  Location: [place];  Service: Vascular;  Laterality: Right;   ESOPHAGOGASTRODUODENOSCOPY (EGD) WITH PROPOFOL N/A 11/25/2021   Procedure: ESOPHAGOGASTRODUODENOSCOPY (EGD) WITH PROPOFOL;  Surgeon: Jackquline Denmark, MD;  [facility];  Service: Gastroenterology;  Laterality: N/A;   FEMORAL-POPLITEAL BYPASS GRAFT Right 05/20/2022   Procedure: RIGHT ABOVE KNEE POPLITEAL ARTERY TO TIBIOPERONEAL TRUNK/POSTERIOR TIBIAL ARTERY BYPASS;  Surgeon: [name], MD;  Location: [place];  Service: Vascular;  Laterality: Right;   ICD IMPLANT N/A 01/30/2020   Procedure: ICD IMPLANT;  Surgeon: Thompson Grayer, MD;  Location: [place] [facility];  Service: Cardiovascular;  Laterality: N/A;   IR IMAGING GUIDED PORT INSERTION  12/04/2021   KNEE ARTHROPLASTY Left 08/05/2017   Procedure: LEFT TOTAL KNEE ARTHROPLASTY WITH COMPUTER NAVIGATION;  Surgeon: Rod Can, MD;  [facility];  Service: Orthopedics;  Laterality: Left;  Needs RNFA   LIGATION OF CILIAC ARTERY Right 05/20/2022   Procedure: LIGATION OF RIGHT ANTERIOR TIBIAL ARTERY;  Surgeon:  [name], MD;  Location: [place];  Service: Vascular;  Laterality: Right;   LOWER EXTREMITY ANGIOGRAPHY Right 05/14/2022   Procedure: Lower Extremity Angiography;  Surgeon: [name], MD;  Location: [place] [facility];  Service: Cardiovascular;  Laterality: Right;   PENILE PROSTHESIS IMPLANT     POLYPECTOMY  11/25/2021   Procedure: POLYPECTOMY;  Surgeon: Jackquline Denmark, MD;  [facility];  Service: Gastroenterology;;   REPLACEMENT TOTAL KNEE  06/12/2013   spinal cyst     THORACOTOMY     left anterior; wound exploration and debridement   TONSILLECTOMY AND ADENOIDECTOMY     age 77   VEIN HARVEST Right 05/20/2022   Procedure: RIGHT GREATER [place];  Surgeon: [name], MD;  Location: [place];  Service: Vascular;  Laterality: Right;    Social History   Socioeconomic History  Marital status: Married    Spouse name: [name]   Number of children: 3   Years of education: Not on file   Highest education level: Not on file  Occupational History   Occupation: truck Education administrator: [name]  Tobacco Use   Smoking status: Former    Packs/day: 4.00    Years: 17.00    Total pack years: 68.00    Types: Cigarettes    Quit date: 05/07/1981    Years since quitting: 41.3    Passive exposure: Never   Smokeless tobacco: Never   Tobacco comments:    Pt states that he would let most of them "burn" pt states that he used anywhere between 4-5PPD  Vaping Use   Vaping Use: Never used  Substance and Sexual Activity   Alcohol use: Yes    Alcohol/week: 1.0 standard drink of alcohol    Types: 1 Cans of beer per week   Drug use: No   Sexual activity: Not on file  Other Topics Concern   Not on file  Social History Narrative   Lives in [place] with spouse   Retired Administrator   Social Determinants of Health   Financial Resource Strain: [place]  (10/27/2021)   Overall Financial Resource Strain (CARDIA)    Difficulty of Paying  Living Expenses: Not hard at all  Food Insecurity: No Food Insecurity (05/22/2022)   Hunger Vital Sign    Worried About Running Out of Food in the Last Year: Never true    Ran Out of Food in the Last Year: Never true  Transportation Needs: No Transportation Needs (05/22/2022)   PRAPARE - Hydrologist (Medical): No    Lack of Transportation (Non-Medical): No  Physical Activity: Inactive (10/27/2021)   Exercise Vital Sign    Days of Exercise per Week: 0 days    Minutes of Exercise per Session: 0 min  Stress: No Stress Concern Present (10/27/2021)   [place]    Feeling of Stress : Not at all  Social Connections: Moderately Isolated (10/27/2021)   Social Connection and Isolation Panel [NHANES]    Frequency of Communication with Friends and Family: More than three times a week    Frequency of Social Gatherings with Friends and Family: More than three times a week    Attends Religious Services: Never    Marine scientist or Organizations: No    Attends Archivist Meetings: Never    Marital Status: Married  Human resources officer Violence: Not At Risk (05/22/2022)   Humiliation, Afraid, Rape, and Kick questionnaire    Fear of Current or Ex-Partner: No    Emotionally Abused: No    Physically Abused: No    Sexually Abused: No    Family History  Problem Relation Age of Onset   Stomach cancer Mother        smokes   Lung cancer Father        chewed tobacco   Heart disease Brother        first MI at 3yo, now 72 for transplant list/ ICM   Pancreatic cancer Brother    Colon cancer Maternal Grandfather    COPD Son        was a smoker   Pancreatic cancer Maternal Uncle    Pancreatic cancer Cousin        mat side x 4   Esophageal cancer Neg Hx  Prostate cancer Neg Hx    Rectal cancer Neg Hx     ROS: no fevers or chills, productive cough, hemoptysis, dysphasia, odynophagia,  melena, hematochezia, dysuria, hematuria, rash, seizure activity, orthopnea, PND, pedal edema, claudication. Remaining systems are negative.  Physical Exam: Well-developed well-nourished in no acute distress.  Skin is warm and dry.  HEENT is normal.  Neck is supple.  Chest is clear to auscultation with normal expansion.  Cardiovascular exam is regular rate and rhythm.  Abdominal exam nontender or distended. No masses palpated. Extremities show no edema. neuro grossly intact  ECG- personally reviewed  A/P  1 coronary artery disease-patient denies chest pain.  Continue statin.  No aspirin given need for long-term anticoagulation,  2 ischemic CM/CHF-patient previously did not tolerate Entresto.  Resume ARB (losartan 25 mg daily) and advance as tolerated.  Check potassium and renal function in 1 week.  Resume SGLT2 inhibitor ([facility] see if either Jardiance or Wilder Glade is covered).  He did not tolerate spironolactone due to breast pain.  Continue beta-blocker.  He is euvolemic on examination.  Continue diuretics at present dose.  3 hypertension-blood pressure controlled.  Continue present medical regimen.  4 hyperlipidemia-continue statin.  He did not tolerate higher doses in the past.  5 history of apical thrombus-continue Coumadin with goal INR 2-3.  6 ICD-followed by electrophysiology.  7 PVD-followed by vascular surgery; continue statin.  8 history of mild aortic stenosis-will need follow-up echoes in the future.  Kirk Ruths, MD

## 2022-08-24 ENCOUNTER — Ambulatory Visit (INDEPENDENT_AMBULATORY_CARE_PROVIDER_SITE_OTHER): Payer: Medicare HMO

## 2022-08-24 DIAGNOSIS — Z95810 Presence of automatic (implantable) cardiac defibrillator: Secondary | ICD-10-CM | POA: Diagnosis not present

## 2022-08-24 DIAGNOSIS — I5042 Chronic combined systolic (congestive) and diastolic (congestive) heart failure: Secondary | ICD-10-CM | POA: Diagnosis not present

## 2022-08-24 NOTE — Progress Notes (Signed)
Remote ICD transmission.   

## 2022-08-25 NOTE — Progress Notes (Signed)
EPIC Encounter for ICM Monitoring  Patient Name: [name] [name] is a 77 y.o. male Date: 08/25/2022 Primary Care Physican: Biagio Borg, MD Primary Cardiologist: [name] Electrophysiologist: Me[place] 04/20/2022 Not weighing at home 05/07/2022 Weight: 181 lbs (does not weigh consistently at home)                                                           Spoke with patient and heart failure questions reviewed.  Transmission results reviewed.  Pt asymptomatic for fluid accumulation.  Reports feeling well at this time and voices no complaints.      DIET:  Pt uses salt shaker at home and does not follow low salt diet.  Also eats restaurant foods.     Optivol thoracic impedance suggesting possible fluid accumulation continues since 9/7.  Fluid index remains above normal threshold since 9/17.   Prescribed: Furosemide 20 mg take 2 tablet(s) (40 mg total) by mouth daily   Spironolactone 25 mg take 1 tablet by mouth daily Jardiance 10 mg take 1 tablet by mouth daily   Labs: 08/13/2022 Creatinine 1.04, BUN 21, Potassium 3.5, Sodium 138  07/30/2022 Creatinine 1.18, BUN 26, Potassium 3.9, Sodium 140, GFR 64 07/15/2022 Creatinine 1.27, BUN 30, Potassium 4.2, Sodium 141, GFR 58  06/30/2022 Creatinine 1.13, BUN 17, Potassium 3.9, Sodium 144, GFR 67  05/21/2022 Creatinine 1.12, BUN 21, Potassium 4.1, Sodium 134  A complete set of results can be found in Results Review.   Recommendations:  Advised to monitor and report if he has fluid symptoms between monthly ICM checks since report continues to suggest ongoing fluid accumulation since Sept but he remains asymptomatic.  Advised no recommendations or changes have been received based on thoracic impedance readings.     Follow-up plan: ICM clinic phone appointment on 09/29/2022.   91 day device clinic remote transmission 10/27/2022.     EP/Cardiology Office Visits:   08/28/2022 with Dr [name].   Copy of ICM check sent to Dr. Myles Gip.      3 month ICM  trend: 08/24/2022.    12-14 Month ICM trend:     Rosalene Billings, RN 08/25/2022 11:28 AM

## 2022-08-28 ENCOUNTER — Ambulatory Visit: Payer: Medicare HMO | Attending: Cardiology | Admitting: Cardiology

## 2022-08-28 ENCOUNTER — Encounter: Payer: Self-pay | Admitting: Cardiology

## 2022-08-28 VITALS — BP 126/76 | HR 75 | Ht 71.0 in | Wt 194.2 lb

## 2022-08-28 DIAGNOSIS — I724 Aneurysm of artery of lower extremity: Secondary | ICD-10-CM

## 2022-08-28 DIAGNOSIS — I10 Essential (primary) hypertension: Secondary | ICD-10-CM

## 2022-08-28 DIAGNOSIS — I255 Ischemic cardiomyopathy: Secondary | ICD-10-CM | POA: Diagnosis not present

## 2022-08-28 DIAGNOSIS — I240 Acute coronary thrombosis not resulting in myocardial infarction: Secondary | ICD-10-CM

## 2022-08-28 DIAGNOSIS — I2510 Atherosclerotic heart disease of native coronary artery without angina pectoris: Secondary | ICD-10-CM

## 2022-08-28 DIAGNOSIS — Z95810 Presence of automatic (implantable) cardiac defibrillator: Secondary | ICD-10-CM | POA: Diagnosis not present

## 2022-08-28 DIAGNOSIS — I5042 Chronic combined systolic (congestive) and diastolic (congestive) heart failure: Secondary | ICD-10-CM | POA: Diagnosis not present

## 2022-08-28 MED ORDER — EMPAGLIFLOZIN 10 MG PO TABS: 10.0000 mg | ORAL_TABLET | Freq: Every day | ORAL | 3 refills | Status: DC

## 2022-08-28 MED ORDER — LOSARTAN POTASSIUM 25 MG PO TABS: 25.0000 mg | ORAL_TABLET | Freq: Every day | ORAL | 3 refills | Status: DC

## 2022-08-28 NOTE — Patient Instructions (Addendum)
Medication Instructions:  Losartan '25mg'$  daily  Discuss Jardiance and/or Farxiga with [facility] to see which one they will [facility]. Let us know what they decide.  *If you need a refill on your cardiac medications before your next appointment, please call your pharmacy*  Lab Work: BMET in one week If you have labs (blood work) drawn today and your tests are completely normal, you will receive your results only by: [place] (if you have MyChart) OR A paper copy in the mail If you have any lab test that is abnormal or we need to change your treatment, we will call you to review the results.  Follow-Up: At [facility], you and your health needs are our priority.  As part of our continuing mission to provide you with exceptional heart care, we have created designated Provider Care Teams.  These Care Teams include your primary Cardiologist (physician) and Advanced Practice Providers (APPs -  Physician Assistants and Nurse Practitioners) who all work together to provide you with the care you need, when you need it.  We recommend signing up for the patient portal called "MyChart".  Sign up information is provided on this After Visit Summary.  MyChart is used to connect with patients for Virtual Visits (Telemedicine).  Patients are able to view lab/test results, encounter notes, upcoming appointments, etc.  Non-urgent messages can be sent to your provider as well.   To learn more about what you can do with MyChart, go to [email or web address].    Your next appointment:   6 month(s)  Provider:   Kirk Ruths, MD     Other Instructions Discuss Vania Rea and/or Wilder Glade with [facility] to see which one they will cover. Let us know what they decide.

## 2022-09-01 ENCOUNTER — Encounter: Payer: Self-pay | Admitting: *Deleted

## 2022-09-01 ENCOUNTER — Ambulatory Visit: Payer: No Typology Code available for payment source | Attending: Cardiology | Admitting: *Deleted

## 2022-09-01 DIAGNOSIS — I240 Acute coronary thrombosis not resulting in myocardial infarction: Secondary | ICD-10-CM | POA: Diagnosis not present

## 2022-09-01 DIAGNOSIS — Z5181 Encounter for therapeutic drug level monitoring: Secondary | ICD-10-CM | POA: Diagnosis not present

## 2022-09-01 LAB — POCT INR: INR: 2.6 (ref 2.0–3.0)

## 2022-09-03 NOTE — Patient Instructions (Signed)
Finished chemo Continue warfarin 1 tablet daily except 1/2 tablet on Tuesdays Recheck in 4 wks

## 2022-09-14 ENCOUNTER — Other Ambulatory Visit: Payer: Self-pay | Admitting: *Deleted

## 2022-09-14 MED ORDER — EMPAGLIFLOZIN 10 MG PO TABS: 10.0000 mg | ORAL_TABLET | Freq: Every day | ORAL | 3 refills | Status: DC

## 2022-09-23 ENCOUNTER — Other Ambulatory Visit: Payer: Self-pay | Admitting: Cardiology

## 2022-09-25 ENCOUNTER — Other Ambulatory Visit (HOSPITAL_COMMUNITY)
Admission: RE | Admit: 2022-09-25 | Discharge: 2022-09-25 | Disposition: A | Discharge status: Home / Self Care | Payer: Medicare HMO | Source: Ambulatory Visit | Attending: Cardiology | Admitting: Cardiology

## 2022-09-25 DIAGNOSIS — Z5181 Encounter for therapeutic drug level monitoring: Secondary | ICD-10-CM | POA: Insufficient documentation

## 2022-09-25 LAB — BASIC METABOLIC PANEL
Anion gap: 8 (ref 5–15)
BUN: 30 mg/dL — ABNORMAL HIGH (ref 8–23)
CO2: 28 mmol/L (ref 22–32)
Calcium: 8.9 mg/dL (ref 8.9–10.3)
Chloride: 103 mmol/L (ref 98–111)
Creatinine, Ser: 1.37 mg/dL — ABNORMAL HIGH (ref 0.61–1.24)
GFR, Estimated: 53 mL/min — ABNORMAL LOW (ref >60)
Glucose, Bld: 135 mg/dL — ABNORMAL HIGH (ref 70–99)
Potassium: 3.8 mmol/L (ref 3.5–5.1)
Sodium: 139 mmol/L (ref 135–145)

## 2022-09-29 ENCOUNTER — Ambulatory Visit: Payer: No Typology Code available for payment source | Admitting: Vascular Surgery

## 2022-09-29 ENCOUNTER — Encounter: Payer: Self-pay | Admitting: Vascular Surgery

## 2022-09-29 ENCOUNTER — Ambulatory Visit (INDEPENDENT_AMBULATORY_CARE_PROVIDER_SITE_OTHER)
Admission: RE | Admit: 2022-09-29 | Discharge: 2022-09-29 | Disposition: A | Discharge status: Home / Self Care | Payer: No Typology Code available for payment source | Source: Ambulatory Visit | Attending: Vascular Surgery | Admitting: Vascular Surgery

## 2022-09-29 ENCOUNTER — Ambulatory Visit (HOSPITAL_COMMUNITY)
Admission: RE | Admit: 2022-09-29 | Discharge: 2022-09-29 | Disposition: A | Discharge status: Home / Self Care | Payer: No Typology Code available for payment source | Source: Ambulatory Visit | Attending: Vascular Surgery | Admitting: Vascular Surgery

## 2022-09-29 ENCOUNTER — Ambulatory Visit: Payer: No Typology Code available for payment source | Admitting: *Deleted

## 2022-09-29 VITALS — BP 110/73 | HR 60 | Temp 98.0°F | Resp 16 | Ht 71.0 in | Wt 190.0 lb

## 2022-09-29 DIAGNOSIS — I240 Acute coronary thrombosis not resulting in myocardial infarction: Secondary | ICD-10-CM | POA: Insufficient documentation

## 2022-09-29 DIAGNOSIS — Z5181 Encounter for therapeutic drug level monitoring: Secondary | ICD-10-CM | POA: Diagnosis not present

## 2022-09-29 DIAGNOSIS — I739 Peripheral vascular disease, unspecified: Secondary | ICD-10-CM | POA: Diagnosis not present

## 2022-09-29 DIAGNOSIS — I724 Aneurysm of artery of lower extremity: Secondary | ICD-10-CM

## 2022-09-29 LAB — VAS US ABI WITH/WO TBI
Left ABI: 1.35
Right ABI: 1.27

## 2022-09-29 LAB — POCT INR: INR: 3.5 — AB (ref 2.0–3.0)

## 2022-09-29 NOTE — Progress Notes (Signed)
Patient name: [name] [name] MRN: [id number] DOB: 24-Aug-1945 Sex: male  REASON FOR CONSULT: Surveillance right leg bypass for popliteal artery aneurysm  HPI: [name] [name] is a 77 y.o. male, with history of lymphoma undergoing chemotherapy, hypertension, hyperlipidemia, coronary artery disease status post CABG that presents for surveillance right leg bypass.  He was initially seen by Dr. Donnetta Hutching in [place].  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  He was undergoing chemotherapy for lymphoma and Dr. Donnetta Hutching recommended he follow-up with me in 1 to 2 months.  I ultimately took him for right above-knee popliteal artery to TP trunk/PT bypass with saphenous vein with ligation of the anterior tibial on 05/10/2022.  He presents for 25-monthinterval follow-up given some sluggish flow in the bypass on previous imaging.  Main complaint today is leg swelling in addition to a lot of swelling around the knee joint.  He feels this gets worse throughout the day.  This is now limiting his ability to walk.  Past Medical History:  Diagnosis Date   AICD (automatic cardioverter/defibrillator) present    ALLERGIC RHINITIS 10/31/2007   Qualifier: Diagnosis of  By: JJenny ReichmannMD, JHunt Oris   Allergy    Cancer ([place] 12/03/2021   non-Hodgkin's B-cell lymphoma   CHOLECYSTECTOMY, HX OF 06/04/2007   Qualifier: Diagnosis of  By: SDanny LawlessCMA, KBurundi    Chronic HFrEF (heart failure with reduced ejection fraction) ([place]    COLONIC POLYPS, HX OF 10/31/2007   Qualifier: Diagnosis of  By: JJenny ReichmannMD, JHowells HX OF 06/04/2007   Qualifier: Diagnosis of  By: SLawai KBurundi    CORONARY ARTERY DISEASE 06/04/2007   Qualifier: Diagnosis of  By: SDanny LawlessCMA, KBurundi    Diverticulosis    Erectile dysfunction    Eye twitch    right eye since chilhood    History of hiatal hernia    HYPERLIPIDEMIA 06/04/2007    Qualifier: Diagnosis of  By: SShirley KBurundi    Hypersomnolence    HYPERTENSION 06/04/2007   Qualifier: Diagnosis of  By: [place] K[place]und[place]    Hypothyroidism    ISCHEMIC CARDIOMYOPATHY 06/04/2007   Qualifier: Diagnosis of  By: [place] KBurundi    Ischemic cardiomyopathy    LV (left ventricular) mural thrombus 02/15/2019   Myocardial infarction ([facility]    per patient , his occurred in 1998    Osteoarthritis    Peripheral vascular disease ([place]    PLMD (periodic limb movement disorder)    Sleep apnea    no cpap' per patient , "i was checked for it and they said i didnt have it "    Vertigo    Vitamin D deficiency     Past Surgical History:  Procedure Laterality Date   BIOPSY  11/25/2021   Procedure: BIOPSY;  Surgeon: GJackquline Denmark MD;  [facility];  Service: Gastroenterology;;   [facility]G  1999   [facility] [place]  Patient reports that a small vein was taken from under his arm and a small incesion was made.   CHOLECYSTECTOMY     COLONOSCOPY     COLONOSCOPY WITH PROPOFOL N/A 11/25/2021   Procedure: COLONOSCOPY WITH PROPOFOL;  Surgeon: GJackquline Denmark MD;  [facility];  Service: Gastroenterology;  Laterality: N/A;   ENDOVASCULAR REPAIR OF POPLITEAL  ARTERY ANEURYSM Right 05/20/2022   Procedure: LIGATION OF POPLITEAL ARTERY ANEURYSM;  Surgeon: [name], MD;  Location: [place];  Service: Vascular;  Laterality: Right;   ESOPHAGOGASTRODUODENOSCOPY (EGD) WITH PROPOFOL N/A 11/25/2021   Procedure: ESOPHAGOGASTRODUODENOSCOPY (EGD) WITH PROPOFOL;  Surgeon: Jackquline Denmark, MD;  [facility];  Service: Gastroenterology;  Laterality: N/A;   FEMORAL-POPLITEAL BYPASS GRAFT Right 05/20/2022   Procedure: RIGHT ABOVE KNEE POPLITEAL ARTERY TO TIBIOPERONEAL TRUNK/POSTERIOR TIBIAL ARTERY BYPASS;  Surgeon: [name], MD;  Location: [place];  Service: Vascular;  Laterality: Right;   ICD IMPLANT N/A 01/30/2020    Procedure: ICD IMPLANT;  Surgeon: Thompson Grayer, MD;  Location: [place] [facility];  Service: Cardiovascular;  Laterality: N/A;   IR IMAGING GUIDED PORT INSERTION  12/04/2021   KNEE ARTHROPLASTY Left 08/05/2017   Procedure: LEFT TOTAL KNEE ARTHROPLASTY WITH COMPUTER NAVIGATION;  Surgeon: Rod Can, MD;  [facility];  Service: Orthopedics;  Laterality: Left;  Needs RNFA   LIGATION OF CILIAC ARTERY Right 05/20/2022   Procedure: LIGATION OF RIGHT ANTERIOR TIBIAL ARTERY;  Surgeon: [name], MD;  Location: [place];  Service: Vascular;  Laterality: Right;   LOWER EXTREMITY ANGIOGRAPHY Right 05/14/2022   Procedure: Lower Extremity Angiography;  Surgeon: [name], MD;  Location: [place] [facility];  Service: Cardiovascular;  Laterality: Right;   PENILE PROSTHESIS IMPLANT     POLYPECTOMY  11/25/2021   Procedure: POLYPECTOMY;  Surgeon: Jackquline Denmark, MD;  [facility];  Service: Gastroenterology;;   REPLACEMENT TOTAL KNEE  06/12/2013   spinal cyst     THORACOTOMY     left anterior; wound exploration and debridement   TONSILLECTOMY AND ADENOIDECTOMY     age 66   VEIN HARVEST Right 05/20/2022   Procedure: [place];  Surgeon: [name], MD;  Location: [place];  Service: Vascular;  Laterality: Right;    Family History  Problem Relation Age of Onset   Stomach cancer Mother        smokes   Lung cancer Father        chewed tobacco   Heart disease Brother        first MI at 83yo, now 37 for transplant list/ ICM   Pancreatic cancer Brother    Colon cancer Maternal Grandfather    COPD Son        was a smoker   Pancreatic cancer Maternal Uncle    Pancreatic cancer Cousin        mat side x 4   Esophageal cancer Neg Hx    Prostate cancer Neg Hx    Rectal cancer Neg Hx     SOCIAL HISTORY: Social History   Socioeconomic History   Marital status: Married    Spouse name: [name]   Number of children: 3   Years of  education: Not on file   Highest education level: Not on file  Occupational History   Occupation: truck Education administrator: [name]  Tobacco Use   Smoking status: Former    Packs/day: 4.00    Years: 17.00    Total pack years: 68.00    Types: Cigarettes    Quit date: 05/07/1981    Years since quitting: 41.4    Passive exposure: Never   Smokeless tobacco: Never   Tobacco comments:    Pt states that he would let most of them "burn" pt states that he used anywhere between 4-5PPD  Vaping Use  Vaping Use: Never used  Substance and Sexual Activity   Alcohol use: Yes    Alcohol/week: 1.0 standard drink of alcohol    Types: 1 Cans of beer per week   Drug use: No   Sexual activity: Not on file  Other Topics Concern   Not on file  Social History Narrative   Lives in [place] with spouse   Retired Administrator   Social Determinants of Health   Financial Resource Strain: Cloud  (10/27/2021)   Overall Financial Resource Strain (CARDIA)    Difficulty of Paying Living Expenses: Not hard at all  Food Insecurity: No Food Insecurity (05/22/2022)   Hunger Vital Sign    Worried About Running Out of Food in the Last Year: Never true    Ran Out of Food in the Last Year: Never true  Transportation Needs: No Transportation Needs (05/22/2022)   PRAPARE - Hydrologist (Medical): No    Lack of Transportation (Non-Medical): No  Physical Activity: Inactive (10/27/2021)   Exercise Vital Sign    Days of Exercise per Week: 0 days    Minutes of Exercise per Session: 0 min  Stress: No Stress Concern Present (10/27/2021)   [place]    Feeling of Stress : Not at all  Social Connections: Moderately Isolated (10/27/2021)   Social Connection and Isolation Panel [NHANES]    Frequency of Communication with Friends and Family: More than three times a week    Frequency of Social Gatherings with  Friends and Family: More than three times a week    Attends Religious Services: Never    Marine scientist or Organizations: No    Attends Archivist Meetings: Never    Marital Status: Married  Human resources officer Violence: Not At Risk (05/22/2022)   Humiliation, Afraid, Rape, and Kick questionnaire    Fear of Current or Ex-Partner: No    Emotionally Abused: No    Physically Abused: No    Sexually Abused: No    Allergies  Allergen Reactions   Ace Inhibitors Cough   Statins Other (See Comments)    Muscle pains    Current Outpatient Medications  Medication Sig Dispense Refill   ARTIFICIAL TEAR SOLUTION OP Place 1 drop into both eyes daily as needed (dry eyes).     Ascorbic Acid (VITA-C PO) Take 1,000 mg by mouth daily.     aspirin EC 81 MG tablet Take 1 tablet (81 mg total) by mouth daily at 6 (six) AM. Swallow whole. 30 tablet 12   b complex vitamins capsule Take 1 capsule by mouth daily.     empagliflozin (JARDIANCE) 10 MG TABS tablet Take 1 tablet (10 mg total) by mouth daily before breakfast. 90 tablet 3   empagliflozin (JARDIANCE) 10 MG TABS tablet TAKE 1 TABLET BY MOUTH EVERY MORNING BEFORE BREAKFAST     ezetimibe (ZETIA) 10 MG tablet TAKE 1 TABLET EVERY DAY (Patient taking differently: Take 10 mg by mouth daily.) 90 tablet 3   feeding supplement (ENSURE ENLIVE / ENSURE PLUS) LIQD Take 237 mLs by mouth 3 (three) times daily between meals. 237 mL 12   furosemide (LASIX) 20 MG tablet Take 2 tablets (40 mg total) by mouth daily. 180 tablet 3   losartan (COZAAR) 25 MG tablet Take 1 tablet (25 mg total) by mouth daily. 90 tablet 3   meloxicam (MOBIC) 15 MG tablet Take 15 mg by mouth daily.  metoprolol succinate (TOPROL-XL) 50 MG 24 hr tablet Take 1 tablet (50 mg total) by mouth daily. 90 tablet 1   Multiple Vitamins-Minerals (MULTIVITAMIN WITH MINERALS) tablet Take 1 tablet by mouth daily. Centrum silver     rosuvastatin (CRESTOR) 5 MG tablet TAKE 1 TABLET EVERY OTHER  DAY 45 tablet 3   triamcinolone (NASACORT) 55 MCG/ACT AERO nasal inhaler Place 2 sprays into the nose daily. 3 Inhaler 3   warfarin (COUMADIN) 5 MG tablet Take 1 tablet daily except 1 1/2 tablets on Mondays or as directed. 90 tablet 5   No current facility-administered medications for this visit.    REVIEW OF SYSTEMS:  [X]$  denotes positive finding, [ ]$  denotes negative finding Cardiac  Comments:  Chest pain or chest pressure:    Shortness of breath upon exertion:    Short of breath when lying flat:    Irregular heart rhythm:        Vascular    Pain in calf, thigh, or hip brought on by ambulation:    Pain in feet at night that wakes you up from your sleep:     Blood clot in your veins:    Leg swelling:  x Right      Pulmonary    Oxygen at home:    Productive cough:     Wheezing:         Neurologic    Sudden weakness in arms or legs:     Sudden numbness in arms or legs:     Sudden onset of difficulty speaking or slurred speech:    Temporary loss of vision in one eye:     Problems with dizziness:         Gastrointestinal    Blood in stool:     Vomited blood:         Genitourinary    Burning when urinating:     Blood in urine:        Psychiatric    Major depression:         Hematologic    Bleeding problems:    Problems with blood clotting too easily:        Skin    Rashes or ulcers:        Constitutional    Fever or chills:      PHYSICAL EXAM: Vitals:   09/29/22 1526  BP: 110/73  Pulse: 60  Resp: 16  Temp: 98 F (36.7 C)  TempSrc: Temporal  SpO2: 95%  Weight: 190 lb (86.2 kg)  Height: 5' 11"$  (1.803 m)    GENERAL: The patient is a well-nourished male, in no acute distress. The vital signs are documented above. CARDIAC: There is a regular rate and rhythm.  VASCULAR:  Right femoral pulse palpable Right DP palpable PULMONARY: No respiratory distress ABDOMEN: Soft and non-tender. MUSCULOSKELETAL: There are no major deformities or  cyanosis. NEUROLOGIC: No focal weakness or paresthesias are detected. SKIN: There are no ulcers or rashes noted. PSYCHIATRIC: The patient has a normal affect.  DATA:   Right leg arterial duplex shows improved waveforms in the bypass now biphasic and triphasic with better velocities.  ABIs on the right are 1.27 triphasic  Assessment/Plan:  77 year old male presents for follow-up and surveillance of right leg bypass for popliteal artery aneurysm.  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  His waveforms in the bypass  look better today.  He has a palpable DP pulse with normal ABIs.  He continues to complain of significant swelling around his thigh incision and around the knee.  There was some fluid collection identified again on ultrasound today.  I will get a CTA to further evaluate to ensure his aneurysm does not have continued enlargement and also to rule out any other fluid collection that may be amendable to surgical drainage.  I will have him see me after CTA.   [name], MD [facility] Office: [phone]

## 2022-09-29 NOTE — Patient Instructions (Signed)
Finished chemo Hold warfarin tonight then resume 1 tablet daily except 1/2 tablet on Tuesdays Eat extra greens today Recheck in 3 wks

## 2022-10-12 ENCOUNTER — Ambulatory Visit: Payer: Medicare HMO | Attending: Cardiovascular Disease

## 2022-10-12 DIAGNOSIS — I5042 Chronic combined systolic (congestive) and diastolic (congestive) heart failure: Secondary | ICD-10-CM | POA: Diagnosis not present

## 2022-10-12 DIAGNOSIS — Z95810 Presence of automatic (implantable) cardiac defibrillator: Secondary | ICD-10-CM

## 2022-10-13 NOTE — Progress Notes (Signed)
EPIC Encounter for ICM Monitoring  Patient Name: [name] [name] is a 77 y.o. male Date: 10/13/2022 Primary Care Physican: Biagio Borg, MD Primary Cardiologist: [name] Electrophysiologist: [place] 04/20/2022 Not weighing at home 05/07/2022 Weight: 181 lbs (does not weigh consistently at home) 09/29/2022 Office Weight: 190 lbs                                                           Spoke with patient and heart failure questions reviewed.  Transmission results reviewed.  Pt ongoing swelling in one leg/foot but was told his circulation is fine.         DIET:  Pt uses salt shaker at home and does not follow low salt diet.  Also eats restaurant foods.     Optivol thoracic impedance suggesting possible fluid accumulation continues since 9/7.  Fluid index remains above normal threshold since 9/17.   Prescribed: Furosemide 20 mg take 2 tablet(s) (40 mg total) by mouth daily   Spironolactone 25 mg take 1 tablet by mouth daily Jardiance 10 mg take 1 tablet by mouth daily   Labs: 09/25/2022 Creatinine 1.37, BUN 30, Potassium 3.8, Sodium 3.8, GFR 53 08/13/2022 Creatinine 1.04, BUN 21, Potassium 3.5, Sodium 138  07/30/2022 Creatinine 1.18, BUN 26, Potassium 3.9, Sodium 140, GFR 64 07/15/2022 Creatinine 1.27, BUN 30, Potassium 4.2, Sodium 141, GFR 58  06/30/2022 Creatinine 1.13, BUN 17, Potassium 3.9, Sodium 144, GFR 67  05/21/2022 Creatinine 1.12, BUN 21, Potassium 4.1, Sodium 134  A complete set of results can be found in Results Review.   Recommendations:  Advised to monitor and report if he has fluid symptoms between monthly ICM checks since report continues to suggest ongoing fluid accumulation since Sept but he remains asymptomatic.  Advised no recommendations or changes have been received based on previous thoracic impedance readings.     Follow-up plan: ICM clinic phone appointment on 11/16/2022.   91 day device clinic remote transmission 10/27/2022.     EP/Cardiology Office Visits:    Recall 08/23/2023 with Dr [name].  07/25/2023 with Dr Myles Gip.    Copy of ICM check sent to Dr. Myles Gip.      3 month ICM trend: 10/12/2022.    12-14 Month ICM trend:     Rosalene Billings, RN 10/13/2022 10:49 AM

## 2022-10-14 ENCOUNTER — Other Ambulatory Visit: Payer: Self-pay

## 2022-10-14 DIAGNOSIS — I724 Aneurysm of artery of lower extremity: Secondary | ICD-10-CM

## 2022-10-20 ENCOUNTER — Ambulatory Visit: Payer: Medicare HMO | Attending: Cardiology | Admitting: *Deleted

## 2022-10-20 DIAGNOSIS — I513 Intracardiac thrombosis, not elsewhere classified: Secondary | ICD-10-CM

## 2022-10-20 DIAGNOSIS — Z5181 Encounter for therapeutic drug level monitoring: Secondary | ICD-10-CM

## 2022-10-20 LAB — POCT INR: INR: 2.6 (ref 2.0–3.0)

## 2022-10-20 NOTE — Patient Instructions (Signed)
Finished chemo Continue warfarin 1 tablet daily except 1/2 tablet on Tuesdays Continue greens Recheck in 4 wks Pending colonoscopy 4/18

## 2022-10-22 ENCOUNTER — Ambulatory Visit (HOSPITAL_COMMUNITY)
Admission: RE | Admit: 2022-10-22 | Discharge: 2022-10-22 | Disposition: A | Discharge status: Home / Self Care | Payer: No Typology Code available for payment source | Source: Ambulatory Visit | Attending: Vascular Surgery | Admitting: Vascular Surgery

## 2022-10-22 DIAGNOSIS — I724 Aneurysm of artery of lower extremity: Secondary | ICD-10-CM | POA: Insufficient documentation

## 2022-10-22 DIAGNOSIS — I728 Aneurysm of other specified arteries: Secondary | ICD-10-CM | POA: Diagnosis not present

## 2022-10-22 MED ORDER — IOHEXOL 350 MG/ML SOLN
150.0000 mL | Freq: Once | INTRAVENOUS | Status: AC | PRN
  Administered 2022-10-22: 150 mL via INTRAVENOUS

## 2022-10-26 ENCOUNTER — Telehealth: Payer: Self-pay

## 2022-10-26 ENCOUNTER — Encounter: Payer: Self-pay | Admitting: Cardiology

## 2022-10-26 DIAGNOSIS — R350 Frequency of micturition: Secondary | ICD-10-CM

## 2022-10-26 NOTE — Telephone Encounter (Signed)
Called patient to schedule Medicare Annual Wellness Visit (AWV). Left message for patient to call back and schedule Medicare Annual Wellness Visit (AWV).  Last date of AWV: 10/27/21  Please schedule an appointment at any time with [facility].    Norton Blizzard, [place] (AAMA)  [place] [phone]

## 2022-10-26 NOTE — Addendum Note (Signed)
Addended by: Patria Mane A on: 10/26/2022 02:16 PM   Modules accepted: Orders

## 2022-10-26 NOTE — Telephone Encounter (Signed)
Contacted [name] to schedule their annual wellness visit. Appointment made for 10/29/22.  Norton Blizzard, [place] (AAMA)  [place] [facility] [phone]

## 2022-10-27 ENCOUNTER — Other Ambulatory Visit (HOSPITAL_COMMUNITY)
Admission: RE | Admit: 2022-10-27 | Discharge: 2022-10-27 | Disposition: A | Discharge status: Home / Self Care | Payer: Medicare HMO | Source: Ambulatory Visit | Attending: Cardiology | Admitting: Cardiology

## 2022-10-27 ENCOUNTER — Ambulatory Visit (INDEPENDENT_AMBULATORY_CARE_PROVIDER_SITE_OTHER): Payer: Medicare HMO

## 2022-10-27 ENCOUNTER — Encounter: Payer: Self-pay | Admitting: Vascular Surgery

## 2022-10-27 ENCOUNTER — Ambulatory Visit (INDEPENDENT_AMBULATORY_CARE_PROVIDER_SITE_OTHER): Payer: No Typology Code available for payment source | Admitting: Vascular Surgery

## 2022-10-27 VITALS — BP 109/71 | HR 60 | Temp 97.9°F | Resp 18 | Ht 71.0 in | Wt 194.0 lb

## 2022-10-27 DIAGNOSIS — R350 Frequency of micturition: Secondary | ICD-10-CM | POA: Diagnosis not present

## 2022-10-27 DIAGNOSIS — I724 Aneurysm of artery of lower extremity: Secondary | ICD-10-CM | POA: Diagnosis not present

## 2022-10-27 DIAGNOSIS — I255 Ischemic cardiomyopathy: Secondary | ICD-10-CM

## 2022-10-27 LAB — URINALYSIS, ROUTINE W REFLEX MICROSCOPIC
Bilirubin Urine: NEGATIVE
Glucose, UA: 250 mg/dL — AB
Hgb urine dipstick: NEGATIVE
Ketones, ur: NEGATIVE mg/dL
Leukocytes,Ua: NEGATIVE
Nitrite: NEGATIVE
Protein, ur: NEGATIVE mg/dL
Specific Gravity, Urine: 1.01 (ref 1.005–1.030)
pH: 5.5 (ref 5.0–8.0)

## 2022-10-27 NOTE — Progress Notes (Signed)
Patient name: [name] [name] MRN: [id number] DOB: 1945-11-06 Sex: male  REASON FOR CONSULT: Follow-up after CTA  HPI: [name] [name] is a 77 y.o. male, with history of lymphoma undergoing chemotherapy, hypertension, hyperlipidemia, coronary artery disease status post CABG that presents for follow-up after CTA for evaluation of right leg fluid collection.  He was initially seen by Dr. Donnetta Hutching in [place].  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  He was undergoing chemotherapy for lymphoma and Dr. Donnetta Hutching recommended he follow-up with me in 1 to 2 months.  I ultimately took him for right above-knee popliteal artery to TP trunk/PT bypass with saphenous vein with ligation of the anterior tibial on 05/10/2022.    He was complaining about a bunch of swelling around the distal thigh particularly when walking.  I sent him for CT scan.  He presents for follow-up today.  Still having a lot of swelling here when he walks.  Past Medical History:  Diagnosis Date   AICD (automatic cardioverter/defibrillator) present    ALLERGIC RHINITIS 10/31/2007   Qualifier: Diagnosis of  By: Jenny Reichmann MD, [place]    Allergy    Cancer ([place]) 12/03/2021   non-Hodgkin's B-cell lymphoma   CHOLECYSTECTOMY, HX OF 06/04/2007   Qualifier: Diagnosis of  By: Danny Lawless CMA, [place]     Chronic HFrEF (heart failure with reduced ejection fraction) ([place])    COLONIC POLYPS, HX OF 10/31/2007   Qualifier: Diagnosis of  By: Jenny Reichmann MD, [place], HX OF 06/04/2007   Qualifier: Diagnosis of  By: [place], [place]     CORONARY ARTERY DISEASE 06/04/2007   Qualifier: Diagnosis of  By: Danny Lawless CMA, [place]     Diverticulosis    Erectile dysfunction    Eye twitch    right eye since chilhood    History of hiatal hernia    HYPERLIPIDEMIA 06/04/2007   Qualifier: Diagnosis of  By: [place], [place]     Hypersomnolence     HYPERTENSION 06/04/2007   Qualifier: Diagnosis of  By: [place], [place]     Hypothyroidism    ISCHEMIC CARDIOMYOPATHY 06/04/2007   Qualifier: Diagnosis of  By: [place], [place]     Ischemic cardiomyopathy    LV (left ventricular) mural thrombus 02/15/2019   Myocardial infarction [facility])    per patient , his occurred in 1998    Osteoarthritis    Peripheral vascular disease ([place])    PLMD (periodic limb movement disorder)    Sleep apnea    no cpap' per patient , "i was checked for it and they said i didnt have it "    Vertigo    Vitamin D deficiency     Past Surgical History:  Procedure Laterality Date   BIOPSY  11/25/2021   Procedure: BIOPSY;  Surgeon: Jackquline Denmark, MD;  Location: [facility];  Service: Gastroenterology;;   CABG  1999   was done at the [facility] in [place], [place].  Patient reports that a small vein was taken from under his arm and a small incesion was made.   CHOLECYSTECTOMY     COLONOSCOPY     COLONOSCOPY WITH PROPOFOL N/A 11/25/2021   Procedure: COLONOSCOPY WITH PROPOFOL;  Surgeon: Jackquline Denmark, MD;  Location: [facility];  Service: Gastroenterology;  Laterality: N/A;   ENDOVASCULAR REPAIR OF POPLITEAL ARTERY ANEURYSM Right 05/20/2022   Procedure: LIGATION OF POPLITEAL  ARTERY ANEURYSM;  Surgeon: [name], MD;  Location: [facility] OR;  Service: Vascular;  Laterality: Right;   ESOPHAGOGASTRODUODENOSCOPY (EGD) WITH PROPOFOL N/A 11/25/2021   Procedure: ESOPHAGOGASTRODUODENOSCOPY (EGD) WITH PROPOFOL;  Surgeon: Jackquline Denmark, MD;  Location: [facility];  Service: Gastroenterology;  Laterality: N/A;   FEMORAL-POPLITEAL BYPASS GRAFT Right 05/20/2022   Procedure: RIGHT ABOVE KNEE POPLITEAL ARTERY TO TIBIOPERONEAL TRUNK/POSTERIOR TIBIAL ARTERY BYPASS;  Surgeon: [name], MD;  Location: [facility];  Service: Vascular;  Laterality: Right;   ICD IMPLANT N/A 01/30/2020   Procedure: ICD IMPLANT;  Surgeon: Thompson Grayer, MD;  Location: [facility];  Service: Cardiovascular;  Laterality: N/A;   IR IMAGING GUIDED PORT INSERTION  12/04/2021   KNEE ARTHROPLASTY Left 08/05/2017   Procedure: LEFT TOTAL KNEE ARTHROPLASTY WITH COMPUTER NAVIGATION;  Surgeon: Rod Can, MD;  Location: [facility];  Service: Orthopedics;  Laterality: Left;  Needs RNFA   LIGATION OF CILIAC ARTERY Right 05/20/2022   Procedure: LIGATION OF RIGHT ANTERIOR TIBIAL ARTERY;  Surgeon: [name], MD;  Location: [facility];  Service: Vascular;  Laterality: Right;   LOWER EXTREMITY ANGIOGRAPHY Right 05/14/2022   Procedure: Lower Extremity Angiography;  Surgeon: [name], MD;  Location: [facility];  Service: Cardiovascular;  Laterality: Right;   PENILE PROSTHESIS IMPLANT     POLYPECTOMY  11/25/2021   Procedure: POLYPECTOMY;  Surgeon: Jackquline Denmark, MD;  [facility]: [facility];  Service: Gastroenterology;;   REPLACEMENT TOTAL KNEE  06/12/2013   spinal cyst     THORACOTOMY     left anterior; wound exploration and debridement   TONSILLECTOMY AND ADENOIDECTOMY     age 44   VEIN HARVEST Right 05/20/2022   Procedure: [place];  Surgeon: [name], MD;  Location: [facility];  Service: Vascular;  Laterality: Right;    Family History  Problem Relation Age of Onset   Stomach cancer Mother        smokes   Lung cancer Father        chewed tobacco   Heart disease Brother        first MI at 5yo, now 28 for transplant list/ ICM   Pancreatic cancer Brother    Colon cancer Maternal Grandfather    COPD Son        was a smoker   Pancreatic cancer Maternal Uncle    Pancreatic cancer Cousin        mat side x 4   Esophageal cancer Neg Hx    Prostate cancer Neg Hx    Rectal cancer Neg Hx     SOCIAL HISTORY: Social History   Socioeconomic History   Marital status: Married    Spouse name: [name] [name]   Number of children: 3   Years of education: Not on file   Highest education level: Not on file   Occupational History   Occupation: truck Education administrator: [name]  Tobacco Use   Smoking status: Former    Packs/day: 4.00    Years: 17.00    Additional pack years: 0.00    Total pack years: 68.00    Types: Cigarettes    Quit date: 05/07/1981    Years since quitting: 41.5    Passive exposure: Never   Smokeless tobacco: Never   Tobacco comments:    Pt states that he would let most of them "burn" pt states that he used anywhere between 4-5PPD  Vaping Use   Vaping Use: Never  used  Substance and Sexual Activity   Alcohol use: Yes    Alcohol/week: 1.0 standard drink of alcohol    Types: 1 Cans of beer per week   Drug use: No   Sexual activity: Not on file  Other Topics Concern   Not on file  Social History Narrative   Lives in [place] with spouse   Retired Administrator   Social Determinants of Health   Financial Resource Strain: [place]  (10/27/2021)   Overall Financial Resource Strain (CARDIA)    Difficulty of Paying Living Expenses: Not hard at all  Food Insecurity: No Food Insecurity (05/22/2022)   Hunger Vital Sign    Worried About Running Out of Food in the Last Year: Never true    Ran Out of Food in the Last Year: Never true  Transportation Needs: No Transportation Needs (05/22/2022)   PRAPARE - Hydrologist (Medical): No    Lack of Transportation (Non-Medical): No  Physical Activity: Inactive (10/27/2021)   Exercise Vital Sign    Days of Exercise per Week: 0 days    Minutes of Exercise per Session: 0 min  Stress: No Stress Concern Present (10/27/2021)   [place]    Feeling of Stress : Not at all  Social Connections: Moderately Isolated (10/27/2021)   Social Connection and Isolation Panel [NHANES]    Frequency of Communication with Friends and Family: More than three times a week    Frequency of Social Gatherings with Friends and Family: More than three  times a week    Attends Religious Services: Never    Marine scientist or Organizations: No    Attends Archivist Meetings: Never    Marital Status: Married  Human resources officer Violence: Not At Risk (05/22/2022)   Humiliation, Afraid, Rape, and Kick questionnaire    Fear of Current or Ex-Partner: No    Emotionally Abused: No    Physically Abused: No    Sexually Abused: No    Allergies  Allergen Reactions   Ace Inhibitors Cough   Statins Other (See Comments)    Muscle pains    Current Outpatient Medications  Medication Sig Dispense Refill   ARTIFICIAL TEAR SOLUTION OP Place 1 drop into both eyes daily as needed (dry eyes).     Ascorbic Acid (VITA-C PO) Take 1,000 mg by mouth daily.     aspirin EC 81 MG tablet Take 1 tablet (81 mg total) by mouth daily at 6 (six) AM. Swallow whole. 30 tablet 12   b complex vitamins capsule Take 1 capsule by mouth daily.     ezetimibe (ZETIA) 10 MG tablet TAKE 1 TABLET EVERY DAY (Patient taking differently: Take 10 mg by mouth daily.) 90 tablet 3   furosemide (LASIX) 20 MG tablet Take 2 tablets (40 mg total) by mouth daily. 180 tablet 3   losartan (COZAAR) 25 MG tablet Take 1 tablet (25 mg total) by mouth daily. 90 tablet 3   meloxicam (MOBIC) 15 MG tablet Take 15 mg by mouth daily.     metoprolol succinate (TOPROL-XL) 50 MG 24 hr tablet Take 1 tablet (50 mg total) by mouth daily. 90 tablet 1   Multiple Vitamins-Minerals (MULTIVITAMIN WITH MINERALS) tablet Take 1 tablet by mouth daily. Centrum silver     rosuvastatin (CRESTOR) 5 MG tablet TAKE 1 TABLET EVERY OTHER DAY 45 tablet 3   triamcinolone (NASACORT) 55 MCG/ACT AERO nasal inhaler Place  2 sprays into the nose daily. 3 Inhaler 3   warfarin (COUMADIN) 5 MG tablet Take 1 tablet daily except 1 1/2 tablets on Mondays or as directed. 90 tablet 5   feeding supplement (ENSURE ENLIVE / ENSURE PLUS) LIQD Take 237 mLs by mouth 3 (three) times daily between meals. (Patient not taking: Reported  on 10/27/2022) 237 mL 12   No current facility-administered medications for this visit.    REVIEW OF SYSTEMS:  [X]  denotes positive finding, [ ]  denotes negative finding Cardiac  Comments:  Chest pain or chest pressure:    Shortness of breath upon exertion:    Short of breath when lying flat:    Irregular heart rhythm:        Vascular    Pain in calf, thigh, or hip brought on by ambulation:    Pain in feet at night that wakes you up from your sleep:     Blood clot in your veins:    Leg swelling:  x Right      Pulmonary    Oxygen at home:    Productive cough:     Wheezing:         Neurologic    Sudden weakness in arms or legs:     Sudden numbness in arms or legs:     Sudden onset of difficulty speaking or slurred speech:    Temporary loss of vision in one eye:     Problems with dizziness:         Gastrointestinal    Blood in stool:     Vomited blood:         Genitourinary    Burning when urinating:     Blood in urine:        Psychiatric    Major depression:         Hematologic    Bleeding problems:    Problems with blood clotting too easily:        Skin    Rashes or ulcers:        Constitutional    Fever or chills:      PHYSICAL EXAM: Vitals:   10/27/22 1057  BP: 109/71  Pulse: 60  Resp: 18  Temp: 97.9 F (36.6 C)  TempSrc: Temporal  SpO2: 95%  Weight: 194 lb (88 kg)  Height: 5\' 11"  (1.803 m)    GENERAL: The patient is a well-nourished male, in no acute distress. The vital signs are documented above. CARDIAC: There is a regular rate and rhythm.  VASCULAR:  Right femoral pulse palpable Right PT brisk by doppler PULMONARY: No respiratory distress ABDOMEN: Soft and non-tender. MUSCULOSKELETAL: There are no major deformities or cyanosis. NEUROLOGIC: No focal weakness or paresthesias are detected. SKIN: There are no ulcers or rashes noted. PSYCHIATRIC: The patient has a normal affect.  DATA:   CTA reviewed with patent right lower extremity  bypass for exclusion of popliteal aneurysm.  No evidence of contrast filling in the aneurysm sac.  Popliteal aneurysm has decreased from 4.7 to 4 cm  Right leg arterial duplex previously showed biphasic and triphasic waveforms in the bypass with better velocities.  ABIs on the right are 1.27 triphasic  Assessment/Plan:  77 year old male presents for follow-up after CTA for evaluation of fluid collection in right leg.  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  Discussed right leg bypass  patent on CT.  There is no filling of the aneurysm with contrast showing appropriate exclusion of the popliteal aneurysm.  The right popliteal aneurysm has decreased from 4.7 to 4 cm since surgery.  He does have a fairly sizable seroma in the distal thigh where the saphenous vein was harvested.  This is very bothersome to him and he is having trouble walking due to swelling.  I have subsequently recommended I&D with likely drain placement.  We need to get his Coumadin held for 5 days prior.  I have asked that he try and wear compression stocking again.  Will schedule for 11/09/22.   [name], MD [facility] Office: [phone]

## 2022-10-28 ENCOUNTER — Other Ambulatory Visit: Payer: Self-pay

## 2022-10-28 DIAGNOSIS — I724 Aneurysm of artery of lower extremity: Secondary | ICD-10-CM

## 2022-10-28 LAB — CUP PACEART REMOTE DEVICE CHECK
Battery Remaining Longevity: 95 mo
Battery Voltage: 3.01 V
Brady Statistic AP VP Percent: 0.02 %
Brady Statistic AP VS Percent: 44.05 %
Brady Statistic AS VP Percent: 0.02 %
Brady Statistic AS VS Percent: 55.91 %
Brady Statistic RA Percent Paced: 42.9 %
Brady Statistic RV Percent Paced: 0.04 %
Date Time Interrogation Session: 20240319031704
HighPow Impedance: 65 Ohm
Implantable Lead Connection Status: 753985
Implantable Lead Connection Status: 753985
Implantable Lead Implant Date: 20210622
Implantable Lead Implant Date: 20210622
Implantable Lead Location: 753859
Implantable Lead Location: 753860
Implantable Lead Model: 5076
Implantable Pulse Generator Implant Date: 20210622
Lead Channel Impedance Value: 304 Ohm
Lead Channel Impedance Value: 361 Ohm
Lead Channel Impedance Value: 418 Ohm
Lead Channel Pacing Threshold Amplitude: 0.5 V
Lead Channel Pacing Threshold Amplitude: 0.625 V
Lead Channel Pacing Threshold Pulse Width: 0.4 ms
Lead Channel Pacing Threshold Pulse Width: 0.4 ms
Lead Channel Sensing Intrinsic Amplitude: 3.5 mV
Lead Channel Sensing Intrinsic Amplitude: 3.5 mV
Lead Channel Sensing Intrinsic Amplitude: 8 mV
Lead Channel Sensing Intrinsic Amplitude: 8 mV
Lead Channel Setting Pacing Amplitude: 1.5 V
Lead Channel Setting Pacing Amplitude: 2.5 V
Lead Channel Setting Pacing Pulse Width: 0.4 ms
Lead Channel Setting Sensing Sensitivity: 0.3 mV
Zone Setting Status: 755011

## 2022-10-29 ENCOUNTER — Ambulatory Visit (INDEPENDENT_AMBULATORY_CARE_PROVIDER_SITE_OTHER): Payer: Medicare HMO

## 2022-10-29 VITALS — Ht 71.0 in | Wt 190.0 lb

## 2022-10-29 DIAGNOSIS — Z0000 Encounter for general adult medical examination without abnormal findings: Secondary | ICD-10-CM | POA: Diagnosis not present

## 2022-10-29 NOTE — Progress Notes (Signed)
I connected with  [name] [name] on 10/29/22 by a audio enabled telemedicine application and verified that I am speaking with the correct person using two identifiers.  Patient Location: Home  Provider Location: Office/Clinic  I discussed the limitations of evaluation and management by telemedicine. The patient expressed understanding and agreed to proceed.  Subjective:   [name] [name] is a 77 y.o. male who presents for Medicare Annual/Subsequent preventive examination.  Review of Systems     Cardiac Risk Factors include: advanced age (>21men, >1 women);dyslipidemia;hypertension;male gender;sedentary lifestyle     Objective:    Today's Vitals   10/29/22 1432  Weight: 190 lb (86.2 kg)  Height: 5\' 11"  (1.803 m)  PainSc: 0-No pain   Body mass index is 26.5 kg/m.     10/29/2022    2:33 PM 08/13/2022   10:44 AM 05/21/2022    8:00 PM 05/20/2022    7:07 AM 05/14/2022    7:01 AM 05/12/2022   10:43 AM 05/07/2022    6:16 AM  Advanced Directives  Does Patient Have a Medical Advance Directive? Yes Yes Yes Yes Yes Yes Yes  Type of Paramedic of [place];Living will [place];Living will Healthcare Power of [place] of [place] of [place];Living will Living will  Does patient want to make changes to medical advance directive?  No - Patient declined No - Patient declined  No - Patient declined No - Patient declined No - Patient declined  Copy of [place] in Chart? No - copy requested No - copy requested  No - copy requested No - copy requested No - copy requested   Would patient like information on creating a medical advance directive?  No - Patient declined   No - Patient declined No - Patient declined     Current Medications (verified) Outpatient Encounter Medications as of 10/29/2022  Medication Sig   ARTIFICIAL TEAR SOLUTION OP Place 1 drop into both  eyes daily as needed (dry eyes).   Ascorbic Acid (VITA-C PO) Take 1,000 mg by mouth daily.   aspirin EC 81 MG tablet Take 1 tablet (81 mg total) by mouth daily at 6 (six) AM. Swallow whole.   b complex vitamins capsule Take 1 capsule by mouth daily.   ezetimibe (ZETIA) 10 MG tablet TAKE 1 TABLET EVERY DAY (Patient taking differently: Take 10 mg by mouth daily.)   feeding supplement (ENSURE ENLIVE / ENSURE PLUS) LIQD Take 237 mLs by mouth 3 (three) times daily between meals. (Patient not taking: Reported on 10/27/2022)   furosemide (LASIX) 20 MG tablet Take 2 tablets (40 mg total) by mouth daily.   losartan (COZAAR) 25 MG tablet Take 1 tablet (25 mg total) by mouth daily.   meloxicam (MOBIC) 15 MG tablet Take 15 mg by mouth daily.   metoprolol succinate (TOPROL-XL) 50 MG 24 hr tablet Take 1 tablet (50 mg total) by mouth daily.   Multiple Vitamins-Minerals (MULTIVITAMIN WITH MINERALS) tablet Take 1 tablet by mouth daily. Centrum silver   rosuvastatin (CRESTOR) 5 MG tablet TAKE 1 TABLET EVERY OTHER DAY   triamcinolone (NASACORT) 55 MCG/ACT AERO nasal inhaler Place 2 sprays into the nose daily.   warfarin (COUMADIN) 5 MG tablet Take 1 tablet daily except 1 1/2 tablets on Mondays or as directed.   No facility-administered encounter medications on file as of 10/29/2022.    Allergies (verified) Ace inhibitors and Statins   History: Past Medical History:  Diagnosis Date   AICD (automatic cardioverter/defibrillator) present    ALLERGIC RHINITIS 10/31/2007   Qualifier: Diagnosis of  By: Jenny Reichmann MD, [place] ([place]) 12/03/2021   non-Hodgkin's B-cell lymphoma   CHOLECYSTECTOMY, HX OF 06/04/2007   Qualifier: Diagnosis of  By: Danny Lawless CMA, [place]     Chronic HFrEF (heart failure with reduced ejection fraction) ([place])    COLONIC POLYPS, HX OF 10/31/2007   Qualifier: Diagnosis of  By: Jenny Reichmann MD, [place], HX OF 06/04/2007   Qualifier: Diagnosis of  By:  [place], [place]     CORONARY ARTERY DISEASE 06/04/2007   Qualifier: Diagnosis of  By: Danny Lawless CMA, [place]     Diverticulosis    Erectile dysfunction    Eye twitch    right eye since chilhood    History of hiatal hernia    HYPERLIPIDEMIA 06/04/2007   Qualifier: Diagnosis of  By: [place], [place]     Hypersomnolence    HYPERTENSION 06/04/2007   Qualifier: Diagnosis of  By: [place], [place]     Hypothyroidism    ISCHEMIC CARDIOMYOPATHY 06/04/2007   Qualifier: Diagnosis of  By: [place], [place]     Ischemic cardiomyopathy    LV (left ventricular) mural thrombus 02/15/2019   Myocardial infarction [facility])    per patient , his occurred in 1998    Osteoarthritis    Peripheral vascular disease ([place])    PLMD (periodic limb movement disorder)    Sleep apnea    no cpap' per patient , "i was checked for it and they said i didnt have it "    Vertigo    Vitamin D deficiency    Past Surgical History:  Procedure Laterality Date   BIOPSY  11/25/2021   Procedure: BIOPSY;  Surgeon: Jackquline Denmark, MD;  [facility]: [facility];  Service: Gastroenterology;;   CABG  1999   was done at the [facility] in [place], [place].  Patient reports that a small vein was taken from under his arm and a small incesion was made.   CHOLECYSTECTOMY     COLONOSCOPY     COLONOSCOPY WITH PROPOFOL N/A 11/25/2021   Procedure: COLONOSCOPY WITH PROPOFOL;  Surgeon: Jackquline Denmark, MD;  [facility];  Service: Gastroenterology;  Laterality: N/A;   ENDOVASCULAR REPAIR OF POPLITEAL ARTERY ANEURYSM Right 05/20/2022   Procedure: LIGATION OF POPLITEAL ARTERY ANEURYSM;  Surgeon: [name], MD;  Location: [place];  Service: Vascular;  Laterality: Right;   ESOPHAGOGASTRODUODENOSCOPY (EGD) WITH PROPOFOL N/A 11/25/2021   Procedure: ESOPHAGOGASTRODUODENOSCOPY (EGD) WITH PROPOFOL;  Surgeon: Jackquline Denmark, MD;  [facility];  Service: Gastroenterology;  Laterality: N/A;   FEMORAL-POPLITEAL  BYPASS GRAFT Right 05/20/2022   Procedure: RIGHT ABOVE KNEE POPLITEAL ARTERY TO TIBIOPERONEAL TRUNK/POSTERIOR TIBIAL ARTERY BYPASS;  Surgeon: [name], MD;  Location: [place];  Service: Vascular;  Laterality: Right;   ICD IMPLANT N/A 01/30/2020   Procedure: ICD IMPLANT;  Surgeon: Thompson Grayer, MD;  Location: [place] [facility];  Service: Cardiovascular;  Laterality: N/A;   IR IMAGING GUIDED PORT INSERTION  12/04/2021   KNEE ARTHROPLASTY Left 08/05/2017   Procedure: LEFT TOTAL KNEE ARTHROPLASTY WITH COMPUTER NAVIGATION;  Surgeon: Rod Can, MD;  [facility];  Service: Orthopedics;  Laterality: Left;  Needs RNFA   LIGATION OF CILIAC ARTERY Right 05/20/2022   Procedure: LIGATION OF RIGHT ANTERIOR TIBIAL ARTERY;  Surgeon: [name], MD;  [facility];  Service: Vascular;  Laterality: Right;   LOWER EXTREMITY ANGIOGRAPHY Right 05/14/2022   Procedure: Lower Extremity Angiography;  Surgeon: [name], MD;  Location: [place] [facility];  Service: Cardiovascular;  Laterality: Right;   PENILE PROSTHESIS IMPLANT     POLYPECTOMY  11/25/2021   Procedure: POLYPECTOMY;  Surgeon: Jackquline Denmark, MD;  [facility];  Service: Gastroenterology;;   REPLACEMENT TOTAL KNEE  06/12/2013   spinal cyst     THORACOTOMY     left anterior; wound exploration and debridement   TONSILLECTOMY AND ADENOIDECTOMY     age 38   VEIN HARVEST Right 05/20/2022   Procedure: [place];  Surgeon: [name], MD;  Location: [place];  Service: Vascular;  Laterality: Right;   Family History  Problem Relation Age of Onset   Stomach cancer Mother        smokes   Lung cancer Father        chewed tobacco   Heart disease Brother        first MI at 22yo, now 31 for transplant list/ ICM   Pancreatic cancer Brother    Colon cancer Maternal Grandfather    COPD Son        was a smoker   Pancreatic cancer Maternal Uncle    Pancreatic cancer Cousin         mat side x 4   Esophageal cancer Neg Hx    Prostate cancer Neg Hx    Rectal cancer Neg Hx    Social History   Socioeconomic History   Marital status: Married    Spouse name: [name]   Number of children: 3   Years of education: Not on file   Highest education level: Not on file  Occupational History   Occupation: truck Education administrator: [name]  Tobacco Use   Smoking status: Former    Packs/day: 4.00    Years: 17.00    Additional pack years: 0.00    Total pack years: 68.00    Types: Cigarettes    Quit date: 05/07/1981    Years since quitting: 41.5    Passive exposure: Never   Smokeless tobacco: Never   Tobacco comments:    Pt states that he would let most of them "burn" pt states that he used anywhere between 4-5PPD  Vaping Use   Vaping Use: Never used  Substance and Sexual Activity   Alcohol use: Yes    Alcohol/week: 1.0 standard drink of alcohol    Types: 1 Cans of beer per week   Drug use: No   Sexual activity: Not on file  Other Topics Concern   Not on file  Social History Narrative   Lives in [place] with spouse   Retired Administrator   Social Determinants of Health   Financial Resource Strain: Gas  (10/29/2022)   Overall Financial Resource Strain (CARDIA)    Difficulty of Paying Living Expenses: Not hard at all  Food Insecurity: No Food Insecurity (10/29/2022)   Hunger Vital Sign    Worried About Running Out of Food in the Last Year: Never true    [place] in the Last Year: Never true  Transportation Needs: No Transportation Needs (10/29/2022)   PRAPARE - Hydrologist (Medical): No    Lack of Transportation (Non-Medical): No  Physical Activity: Inactive (10/29/2022)   Exercise Vital Sign    Days of Exercise  per Week: 0 days    Minutes of Exercise per Session: 0 min  Stress: No Stress Concern Present (10/29/2022)   [place]     Feeling of Stress : Not at all  Social Connections: [place] (10/29/2022)   Social Connection and Isolation Panel [NHANES]    Frequency of Communication with Friends and Family: More than three times a week    Frequency of Social Gatherings with Friends and Family: More than three times a week    Attends Religious Services: More than 4 times per year    Active Member of [employer] or Organizations: Yes    Attends Music therapist: More than 4 times per year    Marital Status: Married    Tobacco Counseling Counseling given: Not Answered Tobacco comments: Pt states that he would let most of them "burn" pt states that he used anywhere between 4-5PPD   Clinical Intake:  Pre-visit preparation completed: Yes  Pain : No/denies pain Pain Score: 0-No pain     BMI - recorded: 26.5 Nutritional Risks: None Diabetes: No  How often do you need to have someone help you when you read instructions, pamphlets, or other written materials from your doctor or pharmacy?: 1 - Never What is the last grade level you completed in school?: HSG  Diabetic? No  Interpreter Needed?: No  Information entered by :: Lisette Abu, LPN.   Activities of Daily Living    10/29/2022    2:36 PM 05/20/2022    7:17 AM  In your present state of health, do you have any difficulty performing the following activities:  Hearing? 0   Vision? 0   Difficulty concentrating or making decisions? 0   Walking or climbing stairs? 0   Dressing or bathing? 0   Doing errands, shopping? 0 0  Preparing Food and eating ? N   Using the Toilet? N   In the past six months, have you accidently leaked urine? N   Do you have problems with loss of bowel control? N   Managing your Medications? N   Managing your Finances? N   Housekeeping or managing your Housekeeping? N     Patient Care Team: Biagio Borg, MD as PCP - General [name] Denice Bors, MD as PCP - Cardiology (Cardiology) Mealor, Yetta Barre, MD  as PCP - Electrophysiology (Cardiology) Derek Jack, MD as Medical Oncologist (Medical Oncology) Brien Mates, RN as Oncology Nurse Navigator (Medical Oncology)  Indicate any recent Medical Services you may have received from other than [facility] providers in the past year (date may be approximate).     Assessment:   This is a routine wellness examination for Rashid.  Hearing/Vision screen Hearing Screening - Comments:: Denies hearing difficulties   Vision Screening - Comments:: Wears rx glasses - up to date with routine eye exams with Rutherford Guys, MD.   Dietary issues and exercise activities discussed: Current Exercise Habits: The patient does not participate in regular exercise at present, Exercise limited by: orthopedic condition(s);respiratory conditions(s)   Goals Addressed             This Visit's Progress    My goal for 2024 to keep maintaining my heallth and follow-up with my providers.        Depression Screen    10/29/2022    2:35 PM 10/27/2021    1:58 PM 10/09/2021   10:41 AM 10/09/2021   10:11 AM 10/02/2020    1:31 PM  10/02/2020    1:08 PM 03/22/2019    2:34 PM  PHQ 2/9 Scores  PHQ - 2 Score 0 0 1 0 0 0 0  PHQ- 9 Score 1          Fall Risk    10/29/2022    2:34 PM 10/27/2021    1:55 PM 10/09/2021   10:41 AM 10/09/2021   10:11 AM 10/02/2020    1:31 PM  [place] in the past year? 0 0 0 0 1  Number falls in past yr: 0 0 0 0 1  Injury with Fall? 0 0 0 0 1  Risk for fall due to : No Fall Risks No Fall Risks  No Fall Risks   Follow up Falls prevention discussed Falls evaluation completed  Falls evaluation completed     [place]:  Any stairs in or around the home? No  If so, are there any without handrails? No  Home free of loose throw rugs in walkways, pet beds, electrical cords, etc? Yes  Adequate lighting in your home to reduce risk of falls? Yes   ASSISTIVE DEVICES UTILIZED TO PREVENT FALLS:  Life  alert? No  Use of a cane, walker or w/c? No  Grab bars in the bathroom? No  Shower chair or bench in shower? No  Elevated toilet seat or a handicapped toilet? No   TIMED UP AND GO:  Was the test performed? No . Telephonic Visit   Cognitive Function:        10/29/2022    2:36 PM  6CIT Screen  What Year? 0 points  What month? 0 points  What time? 0 points  Count back from 20 0 points  Months in reverse 0 points  Repeat phrase 0 points  Total Score 0 points    Immunizations Immunization History  Administered Date(s) Administered   Fluad Quad(high Dose 65+) 05/24/2019, 07/07/2021   Influenza Split 05/10/2012, 05/07/2015   Influenza Whole 05/10/2008   Influenza, High Dose Seasonal PF 06/09/2010, 08/07/2011, 05/27/2016, 06/22/2017, 06/15/2018, 04/12/2020   Influenza,inj,Quad PF,6+ Mos 05/11/2014   Influenza,inj,quad, With Preservative 05/25/2017   Influenza-Unspecified 05/11/2007, 05/24/2008, 05/31/2009, 05/11/2012, 05/18/2013, 05/10/2014, 05/05/2015, 05/10/2017, 07/11/2019, 04/12/2020, 06/24/2022   Moderna Sars-Covid-2 Vaccination 10/17/2019, 11/14/2019, 08/01/2020   Pneumococcal Conjugate-13 12/07/2014, 05/14/2015   Pneumococcal Polysaccharide-23 10/31/2007, 12/02/2016, 10/09/2021   Pneumococcal-Unspecified 05/10/2006, 06/09/2010   Td 10/29/2008   Tdap 03/22/2019   Zoster Recombinat (Shingrix) 08/10/2017, 10/08/2017, 11/10/2017, 01/11/2018    TDAP status: Up to date  Flu Vaccine status: Up to date  Pneumococcal vaccine status: Up to date  Covid-19 vaccine status: Completed vaccines  Qualifies for Shingles Vaccine? Yes   Zostavax completed No   Shingrix Completed?: Yes  Screening Tests Health Maintenance  Topic Date Due   COVID-19 Vaccine (4 - 2023-24 season) 04/10/2022   Medicare Annual Wellness (AWV)  10/29/2023   DTaP/Tdap/Td (3 - Td or Tdap) 03/21/2029   Pneumonia Vaccine 32+ Years old  Completed   INFLUENZA VACCINE  Completed   Hepatitis C Screening   Completed   Zoster Vaccines- Shingrix  Completed   HPV VACCINES  Aged Out   COLONOSCOPY (Pts 45-58yrs Insurance coverage will need to be confirmed)  Discontinued    Health Maintenance  Health Maintenance Due  Topic Date Due   COVID-19 Vaccine (4 - 2023-24 season) 04/10/2022    Colorectal cancer screening: Type of screening: Colonoscopy. Completed 11/25/2021. Repeat every 1 years  Lung Cancer Screening: (Low  Dose CT Chest recommended if Age 67-80 years, 30 pack-year currently smoking OR have quit w/in 15years.) does not qualify.   Lung Cancer Screening Referral: no  Additional Screening:  Hepatitis C Screening: does qualify; Completed 12/03/2021  Vision Screening: Recommended annual ophthalmology exams for early detection of glaucoma and other disorders of the eye. Is the patient up to date with their annual eye exam?  Yes  Who is the provider or what is the name of the office in which the patient attends annual eye exams? Rutherford Guys, MD. If pt is not established with a provider, would they like to be referred to a provider to establish care? No .   Dental Screening: Recommended annual dental exams for proper oral hygiene  Community Resource Referral / Chronic Care Management: CRR required this visit?  No   CCM required this visit?  No      Plan:     I have personally reviewed and noted the following in the patient's chart:   Medical and social history Use of alcohol, tobacco or illicit drugs  Current medications and supplements including opioid prescriptions. Patient is not currently taking opioid prescriptions. Functional ability and status Nutritional status Physical activity Advanced directives List of other physicians Hospitalizations, surgeries, and ER visits in previous 12 months Vitals Screenings to include cognitive, depression, and falls Referrals and appointments  In addition, I have reviewed and discussed with patient certain preventive protocols,  quality metrics, and best practice recommendations. A written personalized care plan for preventive services as well as general preventive health recommendations were provided to patient.     Sheral Flow, LPN   [id number]   Nurse Notes: Normal cognitive status assessed by direct observation by this Nurse Health Advisor. No abnormalities found.

## 2022-10-29 NOTE — Patient Instructions (Addendum)
[name] , Thank you for taking time to come for your Medicare Wellness Visit. I appreciate your ongoing commitment to your health goals. Please review the following plan we discussed and let me know if I can assist you in the future.   These are the goals we discussed:  Goals      My goal for 2024 to keep maintaining my heallth and follow-up with my providers.        This is a list of the screening recommended for you and due dates:  Health Maintenance  Topic Date Due   COVID-19 Vaccine (4 - 2023-24 season) 04/10/2022   Medicare Annual Wellness Visit  10/29/2023   DTaP/Tdap/Td vaccine (3 - Td or Tdap) 03/21/2029   Pneumonia Vaccine  Completed   Flu Shot  Completed   Hepatitis C Screening: USPSTF Recommendation to screen - Ages 18-79 yo.  Completed   Zoster (Shingles) Vaccine  Completed   HPV Vaccine  Aged Out   Colon Cancer Screening  Discontinued    Advanced directives: Yes  Conditions/risks identified: Yes  Next appointment: Follow up in one year for your annual wellness visit.   Preventive Care 25 Years and Older, Male  Preventive care refers to lifestyle choices and visits with your health care provider that can promote health and wellness. What does preventive care include? A yearly physical exam. This is also called an annual well check. Dental exams once or twice a year. Routine eye exams. Ask your health care provider how often you should have your eyes checked. Personal lifestyle choices, including: Daily care of your teeth and gums. Regular physical activity. Eating a healthy diet. Avoiding tobacco and drug use. Limiting alcohol use. Practicing safe sex. Taking low doses of aspirin every day. Taking vitamin and mineral supplements as recommended by your health care provider. What happens during an annual well check? The services and screenings done by your health care provider during your annual well check will depend on your age, overall health,  lifestyle risk factors, and family history of disease. Counseling  Your health care provider may ask you questions about your: Alcohol use. Tobacco use. Drug use. Emotional well-being. Home and relationship well-being. Sexual activity. Eating habits. History of falls. Memory and ability to understand (cognition). Work and work Statistician. Screening  You may have the following tests or measurements: Height, weight, and BMI. Blood pressure. Lipid and cholesterol levels. These may be checked every 5 years, or more frequently if you are over 19 years old. Skin check. Lung cancer screening. You may have this screening every year starting at age 19 if you have a 30-pack-year history of smoking and currently smoke or have quit within the past 15 years. Fecal occult blood test (FOBT) of the stool. You may have this test every year starting at age 51. Flexible sigmoidoscopy or colonoscopy. You may have a sigmoidoscopy every 5 years or a colonoscopy every 10 years starting at age 41. Prostate cancer screening. Recommendations will vary depending on your family history and other risks. Hepatitis C blood test. Hepatitis B blood test. Sexually transmitted disease (STD) testing. Diabetes screening. This is done by checking your blood sugar (glucose) after you have not eaten for a while (fasting). You may have this done every 1-3 years. Abdominal aortic aneurysm (AAA) screening. You may need this if you are a current or former smoker. Osteoporosis. You may be screened starting at age 61 if you are at high risk. Talk with your health care provider about  your test results, treatment options, and if necessary, the need for more tests. Vaccines  Your health care provider may recommend certain vaccines, such as: Influenza vaccine. This is recommended every year. Tetanus, diphtheria, and acellular pertussis (Tdap, Td) vaccine. You may need a Td booster every 10 years. Zoster vaccine. You may need this  after age 35. Pneumococcal 13-valent conjugate (PCV13) vaccine. One dose is recommended after age 25. Pneumococcal polysaccharide (PPSV23) vaccine. One dose is recommended after age 64. Talk to your health care provider about which screenings and vaccines you need and how often you need them. This information is not intended to replace advice given to you by your health care provider. Make sure you discuss any questions you have with your health care provider. Document Released: 08/23/2015 Document Revised: 04/15/2016 Document Reviewed: 05/28/2015 Elsevier Interactive Patient Education  2017 [employer] Prevention in the Home Falls can cause injuries. They can happen to people of all ages. There are many things you can do to make your home safe and to help prevent falls. What can I do on the outside of my home? Regularly fix the edges of walkways and driveways and fix any cracks. Remove anything that might make you trip as you walk through a door, such as a raised step or threshold. Trim any bushes or trees on the path to your home. Use bright outdoor lighting. Clear any walking paths of anything that might make someone trip, such as rocks or tools. Regularly check to see if handrails are loose or broken. Make sure that both sides of any steps have handrails. Any raised decks and porches should have guardrails on the edges. Have any leaves, snow, or ice cleared regularly. Use sand or salt on walking paths during winter. Clean up any spills in your garage right away. This includes oil or grease spills. What can I do in the bathroom? Use night lights. Install grab bars by the toilet and in the tub and shower. Do not use towel bars as grab bars. Use non-skid mats or decals in the tub or shower. If you need to sit down in the shower, use a plastic, non-slip stool. Keep the floor dry. Clean up any water that spills on the floor as soon as it happens. Remove soap buildup in the tub or  shower regularly. Attach bath mats securely with double-sided non-slip rug tape. Do not have throw rugs and other things on the floor that can make you trip. What can I do in the bedroom? Use night lights. Make sure that you have a light by your bed that is easy to reach. Do not use any sheets or blankets that are too big for your bed. They should not hang down onto the floor. Have a firm chair that has side arms. You can use this for support while you get dressed. Do not have throw rugs and other things on the floor that can make you trip. What can I do in the kitchen? Clean up any spills right away. Avoid walking on wet floors. Keep items that you use a lot in easy-to-reach places. If you need to reach something above you, use a strong step stool that has a grab bar. Keep electrical cords out of the way. Do not use floor polish or wax that makes floors slippery. If you must use wax, use non-skid floor wax. Do not have throw rugs and other things on the floor that can make you trip. What can I do with  my stairs? Do not leave any items on the stairs. Make sure that there are handrails on both sides of the stairs and use them. Fix handrails that are broken or loose. Make sure that handrails are as long as the stairways. Check any carpeting to make sure that it is firmly attached to the stairs. Fix any carpet that is loose or worn. Avoid having throw rugs at the top or bottom of the stairs. If you do have throw rugs, attach them to the floor with carpet tape. Make sure that you have a light switch at the top of the stairs and the bottom of the stairs. If you do not have them, ask someone to add them for you. What else can I do to help prevent falls? Wear shoes that: Do not have high heels. Have rubber bottoms. Are comfortable and fit you well. Are closed at the toe. Do not wear sandals. If you use a stepladder: Make sure that it is fully opened. Do not climb a closed stepladder. Make  sure that both sides of the stepladder are locked into place. Ask someone to hold it for you, if possible. Clearly mark and make sure that you can see: Any grab bars or handrails. First and last steps. Where the edge of each step is. Use tools that help you move around (mobility aids) if they are needed. These include: Canes. Walkers. Scooters. Crutches. Turn on the lights when you go into a dark area. Replace any light bulbs as soon as they burn out. Set up your furniture so you have a clear path. Avoid moving your furniture around. If any of your floors are uneven, fix them. If there are any pets around you, be aware of where they are. Review your medicines with your doctor. Some medicines can make you feel dizzy. This can increase your chance of falling. Ask your doctor what other things that you can do to help prevent falls. This information is not intended to replace advice given to you by your health care provider. Make sure you discuss any questions you have with your health care provider. Document Released: 05/23/2009 Document Revised: 01/02/2016 Document Reviewed: 08/31/2014 Elsevier Interactive Patient Education  2017 [employer].

## 2022-11-02 ENCOUNTER — Other Ambulatory Visit: Payer: Self-pay

## 2022-11-03 DIAGNOSIS — H5203 Hypermetropia, bilateral: Secondary | ICD-10-CM | POA: Diagnosis not present

## 2022-11-03 DIAGNOSIS — H52203 Unspecified astigmatism, bilateral: Secondary | ICD-10-CM | POA: Diagnosis not present

## 2022-11-03 DIAGNOSIS — H524 Presbyopia: Secondary | ICD-10-CM | POA: Diagnosis not present

## 2022-11-05 ENCOUNTER — Encounter: Payer: Self-pay | Admitting: Cardiovascular Disease

## 2022-11-05 ENCOUNTER — Other Ambulatory Visit: Payer: Self-pay

## 2022-11-05 ENCOUNTER — Encounter (HOSPITAL_COMMUNITY): Payer: Self-pay | Admitting: Vascular Surgery

## 2022-11-05 NOTE — Progress Notes (Addendum)
[name] denies chest pain or shortness of breath. Patient denies having any s/s of Covid in his household, also denies any known exposure to Covid.  [name] denies any s/s of upper or lower respiratory infections in the past 8 weeks.  [name] PCP is Dr. Cathlean Cower, cardiologist is Dr. [name].  I requested ICD orders from the Peri op cardiac device clinic.  I sent [name] an email, I spoke with the [employer], [facility] and gave her the information.

## 2022-11-05 NOTE — Progress Notes (Signed)
[facility] DEVICE PROGRAMMING  Patient Information: Name:  [name] [name]  DOB:  [birth year]  MRN:  [id number]    Planned Procedure:  Incision and drainage of right leg  Surgeon:  Dr. Wilmer Floor  Date of Procedure:  11/09/22  Cautery will be used.  Position during surgery:  Supine   Please send documentation back to:  [facility] (Fax # [phone])  Device Information:  Clinic EP Physician:  Dr. Doralee Albino   Device Type:  Defibrillator Manufacturer and Phone #:  [employer]: [phone] Pacemaker Dependent?:  No. Date of Last Device Check:  10/27/2022 Normal Device Function?:  Yes.    Electrophysiologist's Recommendations:  Have magnet available. Provide continuous ECG monitoring when magnet is used or reprogramming is to be performed.  Procedure should not interfere with device function.  No device programming or magnet placement needed.  Per Device Clinic Standing Orders, Simone Curia, RN  11:26 AM 11/05/2022

## 2022-11-09 ENCOUNTER — Encounter (HOSPITAL_COMMUNITY): Payer: Self-pay | Admitting: Anesthesiology

## 2022-11-09 MED ORDER — FENTANYL CITRATE (PF) 250 MCG/5ML IJ SOLN
INTRAMUSCULAR | Status: AC
  Filled 2022-11-09: qty 5

## 2022-11-09 MED ORDER — PROPOFOL 10 MG/ML IV BOLUS
INTRAVENOUS | Status: AC
  Filled 2022-11-09: qty 20

## 2022-11-09 NOTE — Anesthesia Preprocedure Evaluation (Deleted)
Anesthesia Evaluation    Reviewed: Allergy & Precautions, Patient's Chart, lab work & pertinent test results  Airway        Dental   Pulmonary former smoker          Cardiovascular hypertension, Pt. on medications and Pt. on home beta blockers + CAD, + CABG, + Peripheral Vascular Disease and +CHF  + Cardiac Defibrillator      Neuro/Psych negative neurological ROS     GI/Hepatic Neg liver ROS, hiatal hernia,,,  Endo/Other  negative endocrine ROS    Renal/GU negative Renal ROS     Musculoskeletal  (+) Arthritis ,    Abdominal   Peds  Hematology  (+) Blood dyscrasia (Coumadin)   Anesthesia Other Findings Right leg seroma after bypass  Reproductive/Obstetrics                             Anesthesia Physical Anesthesia Plan  ASA: 3  Anesthesia Plan: General   Post-op Pain Management:    Induction: Intravenous  PONV Risk Score and Plan: 2  Airway Management Planned: LMA  Additional Equipment:   Intra-op Plan:   Post-operative Plan: Extubation in OR  Informed Consent:   Plan Discussed with:   Anesthesia Plan Comments:        Anesthesia Quick Evaluation

## 2022-11-12 ENCOUNTER — Emergency Department (HOSPITAL_COMMUNITY)
Admission: EM | Admit: 2022-11-12 | Discharge: 2022-11-12 | Disposition: A | Discharge status: Home / Self Care | Payer: No Typology Code available for payment source | Attending: Emergency Medicine | Admitting: Emergency Medicine

## 2022-11-12 ENCOUNTER — Emergency Department (HOSPITAL_COMMUNITY): Payer: No Typology Code available for payment source

## 2022-11-12 ENCOUNTER — Encounter (HOSPITAL_COMMUNITY): Payer: Self-pay

## 2022-11-12 ENCOUNTER — Other Ambulatory Visit: Payer: Self-pay

## 2022-11-12 ENCOUNTER — Inpatient Hospital Stay: Payer: No Typology Code available for payment source | Attending: Hematology

## 2022-11-12 ENCOUNTER — Telehealth: Payer: Self-pay

## 2022-11-12 ENCOUNTER — Encounter (HOSPITAL_COMMUNITY)
Admission: RE | Admit: 2022-11-12 | Discharge: 2022-11-12 | Disposition: A | Discharge status: Home / Self Care | Payer: No Typology Code available for payment source | Source: Ambulatory Visit | Attending: Hematology | Admitting: Hematology

## 2022-11-12 VITALS — BP 126/71 | HR 84 | Temp 99.4°F | Resp 18 | Wt 186.0 lb

## 2022-11-12 DIAGNOSIS — Z7901 Long term (current) use of anticoagulants: Secondary | ICD-10-CM | POA: Diagnosis not present

## 2022-11-12 DIAGNOSIS — Z801 Family history of malignant neoplasm of trachea, bronchus and lung: Secondary | ICD-10-CM | POA: Insufficient documentation

## 2022-11-12 DIAGNOSIS — C8339 Diffuse large B-cell lymphoma, extranodal and solid organ sites: Secondary | ICD-10-CM | POA: Diagnosis present

## 2022-11-12 DIAGNOSIS — I255 Ischemic cardiomyopathy: Secondary | ICD-10-CM | POA: Insufficient documentation

## 2022-11-12 DIAGNOSIS — Z800 Family history of malignant neoplasm of digestive organs: Secondary | ICD-10-CM | POA: Insufficient documentation

## 2022-11-12 DIAGNOSIS — C8510 Unspecified B-cell lymphoma, unspecified site: Secondary | ICD-10-CM | POA: Insufficient documentation

## 2022-11-12 DIAGNOSIS — Z87891 Personal history of nicotine dependence: Secondary | ICD-10-CM | POA: Insufficient documentation

## 2022-11-12 DIAGNOSIS — Z7982 Long term (current) use of aspirin: Secondary | ICD-10-CM | POA: Diagnosis not present

## 2022-11-12 DIAGNOSIS — R531 Weakness: Secondary | ICD-10-CM | POA: Diagnosis present

## 2022-11-12 DIAGNOSIS — Z79899 Other long term (current) drug therapy: Secondary | ICD-10-CM | POA: Diagnosis not present

## 2022-11-12 DIAGNOSIS — Z1152 Encounter for screening for COVID-19: Secondary | ICD-10-CM | POA: Insufficient documentation

## 2022-11-12 DIAGNOSIS — J189 Pneumonia, unspecified organism: Secondary | ICD-10-CM

## 2022-11-12 DIAGNOSIS — J111 Influenza due to unidentified influenza virus with other respiratory manifestations: Secondary | ICD-10-CM | POA: Diagnosis not present

## 2022-11-12 DIAGNOSIS — Z95828 Presence of other vascular implants and grafts: Secondary | ICD-10-CM

## 2022-11-12 DIAGNOSIS — C8330 Diffuse large B-cell lymphoma, unspecified site: Secondary | ICD-10-CM | POA: Insufficient documentation

## 2022-11-12 DIAGNOSIS — C8333 Diffuse large B-cell lymphoma, intra-abdominal lymph nodes: Secondary | ICD-10-CM | POA: Insufficient documentation

## 2022-11-12 LAB — COMPREHENSIVE METABOLIC PANEL
ALT: 119 U/L — ABNORMAL HIGH (ref 0–44)
AST: 122 U/L — ABNORMAL HIGH (ref 15–41)
Albumin: 3 g/dL — ABNORMAL LOW (ref 3.5–5.0)
Alkaline Phosphatase: 157 U/L — ABNORMAL HIGH (ref 38–126)
Anion gap: 9 (ref 5–15)
BUN: 28 mg/dL — ABNORMAL HIGH (ref 8–23)
CO2: 27 mmol/L (ref 22–32)
Calcium: 8.5 mg/dL — ABNORMAL LOW (ref 8.9–10.3)
Chloride: 101 mmol/L (ref 98–111)
Creatinine, Ser: 1.21 mg/dL (ref 0.61–1.24)
GFR, Estimated: >60 mL/min (ref >60)
Glucose, Bld: 154 mg/dL — ABNORMAL HIGH (ref 70–99)
Potassium: 3.8 mmol/L (ref 3.5–5.1)
Sodium: 137 mmol/L (ref 135–145)
Total Bilirubin: 1.1 mg/dL (ref 0.3–1.2)
Total Protein: 6.6 g/dL (ref 6.5–8.1)

## 2022-11-12 LAB — CBC WITH DIFFERENTIAL/PLATELET
Abs Immature Granulocytes: 0.1 10*3/uL — ABNORMAL HIGH (ref 0.00–0.07)
Band Neutrophils: 2 %
Basophils Absolute: 0 10*3/uL (ref 0.0–0.1)
Basophils Relative: 0 %
Eosinophils Absolute: 0.1 10*3/uL (ref 0.0–0.5)
Eosinophils Relative: 1 %
HCT: 38.5 % — ABNORMAL LOW (ref 39.0–52.0)
Hemoglobin: 12.6 g/dL — ABNORMAL LOW (ref 13.0–17.0)
Lymphocytes Relative: 11 %
Lymphs Abs: 1 10*3/uL (ref 0.7–4.0)
MCH: 31.3 pg (ref 26.0–34.0)
MCHC: 32.7 g/dL (ref 30.0–36.0)
MCV: 95.5 fL (ref 80.0–100.0)
Metamyelocytes Relative: 1 %
Monocytes Absolute: 0.4 10*3/uL (ref 0.1–1.0)
Monocytes Relative: 4 %
Neutro Abs: 7.7 10*3/uL (ref 1.7–7.7)
Neutrophils Relative %: 81 %
Platelets: 180 10*3/uL (ref 150–400)
RBC: 4.03 MIL/uL — ABNORMAL LOW (ref 4.22–5.81)
RDW: 13.9 % (ref 11.5–15.5)
WBC: 9.3 10*3/uL (ref 4.0–10.5)
nRBC: 0 % (ref 0.0–0.2)

## 2022-11-12 LAB — RESP PANEL BY RT-PCR (RSV, FLU A&B, COVID)  RVPGX2
Influenza A by PCR: NEGATIVE
Influenza B by PCR: NEGATIVE
Resp Syncytial Virus by PCR: NEGATIVE
SARS Coronavirus 2 by RT PCR: NEGATIVE

## 2022-11-12 LAB — URINALYSIS, ROUTINE W REFLEX MICROSCOPIC
Bacteria, UA: NONE SEEN
Bilirubin Urine: NEGATIVE
Glucose, UA: NEGATIVE mg/dL
Hgb urine dipstick: NEGATIVE
Ketones, ur: NEGATIVE mg/dL
Leukocytes,Ua: NEGATIVE
Nitrite: NEGATIVE
Protein, ur: 100 mg/dL — AB
Specific Gravity, Urine: 1.027 (ref 1.005–1.030)
pH: 5 (ref 5.0–8.0)

## 2022-11-12 LAB — MAGNESIUM: Magnesium: 2.1 mg/dL (ref 1.7–2.4)

## 2022-11-12 LAB — LACTATE DEHYDROGENASE: LDH: 394 U/L — ABNORMAL HIGH (ref 98–192)

## 2022-11-12 LAB — CBG MONITORING, ED: Glucose-Capillary: 134 mg/dL — ABNORMAL HIGH (ref 70–99)

## 2022-11-12 MED ORDER — SODIUM CHLORIDE 0.9 % IV SOLN
500.0000 mg | Freq: Once | INTRAVENOUS | Status: AC
  Administered 2022-11-12: 500 mg via INTRAVENOUS
  Filled 2022-11-12: qty 5

## 2022-11-12 MED ORDER — IOHEXOL 300 MG/ML  SOLN
75.0000 mL | Freq: Once | INTRAMUSCULAR | Status: AC | PRN
  Administered 2022-11-12: 75 mL via INTRAVENOUS

## 2022-11-12 MED ORDER — SODIUM CHLORIDE 0.9 % IV SOLN
1.0000 g | Freq: Once | INTRAVENOUS | Status: AC
  Administered 2022-11-12: 1 g via INTRAVENOUS
  Filled 2022-11-12: qty 10

## 2022-11-12 MED ORDER — CEFDINIR 300 MG PO CAPS: 300.0000 mg | ORAL_CAPSULE | Freq: Two times a day (BID) | ORAL | 0 refills | Status: DC

## 2022-11-12 MED ORDER — HEPARIN SOD (PORK) LOCK FLUSH 100 UNIT/ML IV SOLN
500.0000 [IU] | Freq: Once | INTRAVENOUS | Status: AC
  Administered 2022-11-12: 500 [IU] via INTRAVENOUS

## 2022-11-12 MED ORDER — SODIUM CHLORIDE 0.9% FLUSH
10.0000 mL | Freq: Once | INTRAVENOUS | Status: AC
  Administered 2022-11-12: 10 mL via INTRAVENOUS

## 2022-11-12 MED ORDER — DOXYCYCLINE HYCLATE 100 MG PO CAPS: 100.0000 mg | ORAL_CAPSULE | Freq: Two times a day (BID) | ORAL | 0 refills | Status: DC

## 2022-11-12 MED ORDER — FLUDEOXYGLUCOSE F - 18 (FDG) INJECTION
9.9100 | Freq: Once | INTRAVENOUS | Status: AC | PRN
  Administered 2022-11-12: 9.91 via INTRAVENOUS

## 2022-11-12 NOTE — Discharge Instructions (Signed)
You were seen for pneumonia in the emergency department.   At home, please take the antibiotics (Omnicef and doxycycline) for your pneumonia.    Follow-up with your primary doctor in 2-3 days regarding your visit.    Return immediately to the emergency department if you experience any of the following: Difficulty breathing, or any other concerning symptoms.    Thank you for visiting our Emergency Department. It was a pleasure taking care of you today.

## 2022-11-12 NOTE — ED Provider Notes (Signed)
  Physical Exam  BP 127/75   Pulse 83   Temp 99.9 F (37.7 C) (Oral)   Resp 16   Ht 5\' 11"  (1.803 m)   Wt 84.3 kg   SpO2 95%   BMI 25.92 kg/m   Physical Exam  Procedures  Procedures  ED Course / MDM   Clinical Course as of 11/12/22 1929  Thu Nov 12, 2022  1653 Assumed care from Dr. Rogene Houston.  77 year old male with a history of lymphoma sent from hemeonc clinic with cough. For past week has had flu like illness with cough that is productive. CXR has infiltrates concerning for multifocal pna vs recurrent lymphoma.  Was given ceftriaxone and azithromycin. Not hypoxic. Covid and flu negative. Anticipate DC if remains stable.  [RP]  1911 Discussed with radiology regarding his CT of the chest he suspects that the findings are infectious.  [RP]  1928 Patient reassessed and is satting well on room air.  Normal work of breathing.  Discussed the CT findings with the patient and we will send him home with Omnicef and doxycycline to treat him for community-acquired pneumonia. [RP]    Clinical Course User Index [RP] Fransico Meadow, MD   Medical Decision Making Amount and/or Complexity of Data Reviewed Labs: ordered. Radiology: ordered.  Risk Prescription drug management.      Fransico Meadow, MD 11/12/22 [id number]

## 2022-11-12 NOTE — ED Provider Notes (Addendum)
[facility] Provider Note   CSN: [id number] Arrival date & time: 11/12/22  1103     History  Chief Complaint  Patient presents with   Weakness    [name] [name] is a 77 y.o. male.  Patient with a history of high-grade B cell lymphoma.  Apparently in remission.  Had a PET scan done today.  Went up to oncology clinic to have port flushed out.  Patient has had a 1 week history of flulike illness with productive cough sometimes yellow sometimes black in nature.  No blood.  Patient had lab work done up there and then was sent down here for further evaluation.  Oxygen sats here 9392% on room air patient not normally on oxygen.  Temp here 99.9.  Patient has not had any nausea vomiting or diarrhea.  No one else sick at home.       Home Medications Prior to Admission medications   Medication Sig Start Date End Date Taking? Authorizing Provider  Ascorbic Acid (VITA-C PO) Take 1,000 mg by mouth daily.    [provider]  aspirin EC 81 MG tablet Take 1 tablet (81 mg total) by mouth daily at 6 (six) AM. Swallow whole. 05/23/22   Baglia, Corrina, PA-C  b complex vitamins capsule Take 1 capsule by mouth daily.    [provider]  ezetimibe (ZETIA) 10 MG tablet TAKE 1 TABLET EVERY DAY Patient taking differently: Take 10 mg by mouth daily. 07/21/21   [name], MD  feeding supplement (ENSURE ENLIVE / ENSURE PLUS) LIQD Take 237 mLs by mouth 3 (three) times daily between meals. Patient not taking: Reported on 10/27/2022 01/21/22   Orson Eva, MD  furosemide (LASIX) 20 MG tablet Take 2 tablets (40 mg total) by mouth daily. 06/23/22   [name], MD  losartan (COZAAR) 25 MG tablet Take 1 tablet (25 mg total) by mouth daily. 08/28/22 08/23/23  [name], MD  meloxicam (MOBIC) 15 MG tablet Take 15 mg by mouth daily.    [provider]  metoprolol succinate (TOPROL-XL) 50 MG 24 hr tablet Take 1 tablet (50 mg  total) by mouth daily. 09/23/22   [name], MD  Multiple Vitamins-Minerals (MULTIVITAMIN WITH MINERALS) tablet Take 1 tablet by mouth daily. Centrum silver    [provider]  rosuvastatin (CRESTOR) 5 MG tablet TAKE 1 TABLET EVERY OTHER DAY 08/06/22   [name], MD  warfarin (COUMADIN) 5 MG tablet Take 1 tablet daily except 1 1/2 tablets on Mondays or as directed. Patient taking differently: Take 2.5-5 mg by mouth See admin instructions. Take 5 mg  tablet daily EXCEPT: Tuesday take 2.5 mg  in the morning or as directed. 06/23/22   [name], MD      Allergies    Ace inhibitors and Statins    Review of Systems   Review of Systems  Constitutional:  Negative for chills and fever.  HENT:  Positive for congestion. Negative for ear pain and sore throat.   Eyes:  Negative for pain and visual disturbance.  Respiratory:  Positive for cough. Negative for shortness of breath.   Cardiovascular:  Negative for chest pain and palpitations.  Gastrointestinal:  Negative for abdominal pain and vomiting.  Genitourinary:  Negative for dysuria and hematuria.  Musculoskeletal:  Negative for arthralgias and back pain.  Skin:  Negative for color change and rash.  Neurological:  Negative for seizures and syncope.  All  other systems reviewed and are negative.   Physical Exam Updated Vital Signs BP 126/74 (BP Location: Left Arm)   Pulse 76   Temp 99.9 F (37.7 C) (Oral)   Resp 20   Ht 1.803 m (5\' 11" )   Wt 84.3 kg   SpO2 97%   BMI 25.92 kg/m  Physical Exam Vitals and nursing note reviewed.  Constitutional:      General: He is not in acute distress.    Appearance: Normal appearance. He is well-developed. He is not ill-appearing.  HENT:     Head: Normocephalic and atraumatic.  Eyes:     Extraocular Movements: Extraocular movements intact.     Conjunctiva/sclera: Conjunctivae normal.     Pupils: Pupils are equal, round, and reactive to light.  Cardiovascular:      Rate and Rhythm: Normal rate and regular rhythm.     Heart sounds: No murmur heard. Pulmonary:     Effort: Pulmonary effort is normal. No respiratory distress.     Breath sounds: Normal breath sounds. No wheezing or rales.     Comments: Right IJ port in place. Abdominal:     Palpations: Abdomen is soft.     Tenderness: There is no abdominal tenderness.  Musculoskeletal:        General: No swelling.     Cervical back: Neck supple.  Skin:    General: Skin is warm and dry.     Capillary Refill: Capillary refill takes less than 2 seconds.  Neurological:     General: No focal deficit present.     Mental Status: He is alert and oriented to person, place, and time.  Psychiatric:        Mood and Affect: Mood normal.     ED Results / Procedures / Treatments   Labs (all labs ordered are listed, but only abnormal results are displayed) Labs Reviewed  URINALYSIS, ROUTINE W REFLEX MICROSCOPIC - Abnormal; Notable for the following components:      Result Value   Color, Urine AMBER (*)    APPearance HAZY (*)    Protein, ur 100 (*)    All other components within normal limits  CBG MONITORING, ED - Abnormal; Notable for the following components:   Glucose-Capillary 134 (*)    All other components within normal limits  RESP PANEL BY RT-PCR (RSV, FLU A&B, COVID)  [id number]    EKG None  Radiology NM PET Image Restag (PS) Skull Base To Thigh  Result Date: 11/12/2022 CLINICAL DATA:  Subsequent treatment strategy for high-grade B-cell lymphoma. EXAM: NUCLEAR MEDICINE PET SKULL BASE TO THIGH TECHNIQUE: 9.91 mCi F-18 FDG was injected intravenously. Full-ring PET imaging was performed from the skull base to thigh after the radiotracer. CT data was obtained and used for attenuation correction and anatomic localization. Fasting blood glucose: 181 mg/dl COMPARISON:  PET-CT 04/30/2022 and 02/05/2022. FINDINGS: Mediastinal blood pool activity: SUV max 2.11 Liver activity: SUV max 3.04 NECK: No  hypermetabolic lymph nodes in the neck. Incidental CT findings: There are air-fluid levels in both maxillary sinuses suggesting acute sinusitis. CHEST: No hypermetabolic mediastinal or hilar nodes. No suspicious pulmonary nodules on the CT scan. No hypermetabolic supraclavicular or axillary adenopathy. There are multifocal pulmonary infiltrates with hypermetabolism. On the CT scan and there are also tree-in-bud type changes suggesting atypical infection such as MAC. No findings suspicious for intrapulmonary lymphoma. Incidental CT findings: The pacer wires are stable. Stable advanced vascular disease. ABDOMEN/PELVIS: No abnormal hypermetabolic activity within the liver, pancreas, adrenal glands, or  spleen. No hypermetabolic lymph nodes in the abdomen or pelvis. Incidental CT findings: Surgical changes cholecystectomy. Stable vascular disease. No focal aneurysm. Penile prosthesis again noted. SKELETON: Mild diffuse hypermetabolic marrow possibly related to rebound from chemotherapy or marrow stimulating drugs. No discrete bone lesions are identified on the CT scan. Incidental CT findings: None. IMPRESSION: 1. No hypermetabolic adenopathy in the neck, chest, abdomen/pelvis or bony structures to suggest recurrent lymphoma. 2. Diffuse hypermetabolic pulmonary infiltrates and tree-in-bud type changes suggesting atypical infection such as MAC. Recommend follow-up noncontrast chest CT in 3 months after appropriate treatment. 3. Mild diffuse hypermetabolic marrow possibly related to chemotherapy or marrow stimulating drugs. No discrete bone lesions are identified on the CT scan. 4. Bilateral maxillary sinusitis. Electronically Signed   By: Marijo Sanes M.D.   On: 11/12/2022 14:30   DG Chest Port 1 View  Result Date: 11/12/2022 CLINICAL DATA:  Productive cough. EXAM: PORTABLE CHEST 1 VIEW COMPARISON:  Chest x-ray 01/19/2022 FINDINGS: The cardiac silhouette, mediastinal and hilar contours are within normal limits and  stable. Stable right IJ power port and stable pacer wires. Patchy and pulmonary infiltrates are noted suspicious for pneumonia. IMPRESSION: Patchy and pulmonary infiltrates suspicious for pneumonia. Electronically Signed   By: Marijo Sanes M.D.   On: 11/12/2022 14:02    Procedures Procedures    Medications Ordered in ED Medications - No data to display  ED Course/ Medical Decision Making/ A&P                             Medical Decision Making Amount and/or Complexity of Data Reviewed Labs: ordered. Radiology: ordered.   Patient with flulike illness productive cough.  Sent down from cancer center.  Patient has B-cell lymphoma in remission as far as they know.  Patient had a PET scan today those results are pending.  Urinalysis negative for urinary tract infection.  CBC G blood sugar was 134.  Respiratory panel negative for COVID flu and RSV.  Patient had electrolytes done upstairs.  Chest x-ray patchy and pulmonary infiltrates suspicious for pneumonia.  Due to the history of lymphoma have ordered CT chest for further evaluation.  And for further clarification about the pneumonia.  Clinically sounds like certainly could have a pneumonia.  Temperature here 99.9 oxygen sats in the low 90s.  Patient not optically tachypneic.  Patient nontoxic no acute distress.  Patient does not have a septic presentation at this time.  Patient will be started on Rocephin and Zithromax while were waiting for the CT chest.  Patient most likely will be treated as an outpatient.   Final Clinical Impression(s) / ED Diagnoses Final diagnoses:  Influenza-like illness  B-cell lymphoma, unspecified B-cell lymphoma type, unspecified body region    Rx / DC Orders ED Discharge Orders     None         Fredia Sorrow, MD 11/12/22 1556    Fredia Sorrow, MD 11/12/22 1610

## 2022-11-12 NOTE — ED Triage Notes (Signed)
Patient comes in with complaints of spitting up "black, coal look sputum." Patient states this happening for over a weak. Patient states previous hx of lymphoma cancer in remission. Patient states was seen in cancer center this morning with blood work collected and was informed by them to come be seen in the ED for continues weakness and discolored sputum.

## 2022-11-12 NOTE — Patient Instructions (Signed)
[facility]  Discharge Instructions: Thank you for choosing [facility] to provide your oncology and hematology care.  If you have a lab appointment with the [facility] - please note that after April 8th, 2024, all labs will be drawn in the cancer center.  You do not have to check in or register with the main entrance as you have in the past but will complete your check-in in the cancer center.  Wear comfortable clothing and clothing appropriate for easy access to any Portacath or PICC line.   We strive to give you quality time with your provider. You may need to reschedule your appointment if you arrive late (15 or more minutes).  Arriving late affects you and other patients whose appointments are after yours.  Also, if you miss three or more appointments without notifying the office, you may be dismissed from the clinic at the provider's discretion.      For prescription refill requests, have your pharmacy contact our office and allow 72 hours for refills to be completed.    Today you received the following port flush, lab drawn from arm. Return next week for Dr. Visit.   To help prevent nausea and vomiting after your treatment, we encourage you to take your nausea medication as directed.  BELOW ARE SYMPTOMS THAT SHOULD BE REPORTED IMMEDIATELY: *FEVER GREATER THAN 100.4 F (38 C) OR HIGHER *CHILLS OR SWEATING *NAUSEA AND VOMITING THAT IS NOT CONTROLLED WITH YOUR NAUSEA MEDICATION *UNUSUAL SHORTNESS OF BREATH *UNUSUAL BRUISING OR BLEEDING *URINARY PROBLEMS (pain or burning when urinating, or frequent urination) *BOWEL PROBLEMS (unusual diarrhea, constipation, pain near the anus) TENDERNESS IN MOUTH AND THROAT WITH OR WITHOUT PRESENCE OF ULCERS (sore throat, sores in mouth, or a toothache) UNUSUAL RASH, SWELLING OR PAIN  UNUSUAL VAGINAL DISCHARGE OR ITCHING   Items with * indicate a potential emergency and should be followed up as soon as possible or  go to the Emergency Department if any problems should occur.  Please show the CHEMOTHERAPY ALERT CARD or IMMUNOTHERAPY ALERT CARD at check-in to the Emergency Department and triage nurse.  Should you have questions after your visit or need to cancel or reschedule your appointment, please contact [facility] [phone]  and follow the prompts.  Office hours are 8:00 a.m. to 4:30 p.m. Monday - Friday. Please note that voicemails left after 4:00 p.m. may not be returned until the following business day.  We are closed weekends and major holidays. You have access to a nurse at all times for urgent questions. Please call the main number to the clinic [phone] and follow the prompts.  For any non-urgent questions, you may also contact your provider using MyChart. We now offer e-Visits for anyone 38 and older to request care online for non-urgent symptoms. For details visit [email or web address].   Also download the MyChart app! Go to the app store, search "MyChart", open the app, select [facility], and log in with your MyChart username and password.

## 2022-11-12 NOTE — ED Notes (Signed)
ED Provider at bedside. 

## 2022-11-12 NOTE — Progress Notes (Signed)
Patient presents today for port flush lab.  Port flushed with good blood return noted. Labs drawn from arm d/t not enough blood return. No bruising or swelling at site. Bandaid applied. Patient reports increased weakness, low-grade fever, coughing up black sputum that started a week ago. Dr. Delton Coombes made aware, advised patient to be evaluated in ED or urgent care. Patient discharged in satisfactory condition to ED. VVS stable with no signs or symptoms of distressed noted.

## 2022-11-12 NOTE — Telephone Encounter (Signed)
Spoke with patient's wife regarding rescheduling I&D of right leg that was cancelled on 4/1 due to patient sick. She reports patient had been coughing, weak and could hardly walk for 1 week, but symptoms are starting to improve other than weakness. He plans to follow up with PCP.  Advised based on anesthesia protocol for URI symptoms, we will have to postpone surgery for at least 4 weeks. She verbalized understanding. Surgery has been tentatively rescheduled for 4/29, but states she will have patient call back to confirm due to he is currently not available.

## 2022-11-12 NOTE — ED Notes (Signed)
Patient transported to CT 

## 2022-11-13 NOTE — Telephone Encounter (Signed)
Spoke with patient, who confirmed being diagnosed with PNA on yesterday. Advised patient based on the new diagnosis, surgery will need to be postponed for 8 weeks. He agreed to schedule surgery on 5/31. Instructions reviewed and advised will update Dr. Chestine Spore and if he recommends scheduling earlier, our office will notify him. Patient verbalized understanding.   Per Dr. Chestine Spore, "that sounds fine.  It is a seroma and not urgent/emergent."

## 2022-11-16 ENCOUNTER — Encounter: Payer: Self-pay | Admitting: Internal Medicine

## 2022-11-16 ENCOUNTER — Ambulatory Visit (INDEPENDENT_AMBULATORY_CARE_PROVIDER_SITE_OTHER): Payer: Medicare HMO | Admitting: Internal Medicine

## 2022-11-16 ENCOUNTER — Ambulatory Visit: Payer: No Typology Code available for payment source | Attending: Cardiovascular Disease

## 2022-11-16 VITALS — BP 112/66 | HR 92 | Temp 97.6°F | Ht 71.0 in | Wt 189.0 lb

## 2022-11-16 DIAGNOSIS — Z0001 Encounter for general adult medical examination with abnormal findings: Secondary | ICD-10-CM | POA: Insufficient documentation

## 2022-11-16 DIAGNOSIS — R739 Hyperglycemia, unspecified: Secondary | ICD-10-CM | POA: Diagnosis not present

## 2022-11-16 DIAGNOSIS — I5042 Chronic combined systolic (congestive) and diastolic (congestive) heart failure: Secondary | ICD-10-CM

## 2022-11-16 DIAGNOSIS — J019 Acute sinusitis, unspecified: Secondary | ICD-10-CM | POA: Diagnosis not present

## 2022-11-16 DIAGNOSIS — Z95810 Presence of automatic (implantable) cardiac defibrillator: Secondary | ICD-10-CM

## 2022-11-16 DIAGNOSIS — J189 Pneumonia, unspecified organism: Secondary | ICD-10-CM

## 2022-11-16 DIAGNOSIS — I10 Essential (primary) hypertension: Secondary | ICD-10-CM

## 2022-11-16 MED ORDER — CEFDINIR 300 MG PO CAPS: 300.0000 mg | ORAL_CAPSULE | Freq: Two times a day (BID) | ORAL | 0 refills | Status: DC

## 2022-11-16 MED ORDER — DOXYCYCLINE HYCLATE 100 MG PO CAPS: 100.0000 mg | ORAL_CAPSULE | Freq: Two times a day (BID) | ORAL | 0 refills | Status: AC

## 2022-11-16 NOTE — Assessment & Plan Note (Signed)
Lab Results  Component Value Date   HGBA1C 5.2 05/20/2022   Stable, pt to continue current medical treatment  - diet, wt control

## 2022-11-16 NOTE — Assessment & Plan Note (Signed)
Improved, but suspect will need more than 7 days tx - will give additional for total 14 day tx

## 2022-11-16 NOTE — Assessment & Plan Note (Signed)

## 2022-11-16 NOTE — Progress Notes (Signed)
Patient ID: NORMAL RECINOS, male   DOB: 1946/03/25, 77 y.o.   MRN: [id number]         Chief Complaint:: wellness exam and Medical Management of Chronic Issues (Seen at [facility], diagnosed with pneumonia in one lung, which one was not specified)  , sinusitis, bilateral CAP lower lobes, htn, hyperlgycmia       HPI:  [name] [name] is a 77 y.o. male here for wellness exam, declines covid booster, o/w up to date                        Also seen and tx at [facility] ED 4/4 with PET and CT lung c/w acute maxillary sinusitis, in addition to bilateral lower lobe consolidation, tx with doxy course and omnicerf course x planned 7 days.  Pt reports overall feeling improved with less sinus pain and congestion, less cough.  Pt denies chest pain, increased sob or doe, wheezing, orthopnea, PND, increased LE swelling, palpitations, dizziness or syncope.   Pt denies polydipsia, polyuria, or new focal neuro s/s.    Pt denies fever, wt loss, night sweats, loss of appetite, or other constitutional symptoms   labs were c/w mild AKI.  Declines further lab today   Wt Readings from Last 3 Encounters:  11/16/22 189 lb (85.7 kg)  11/12/22 185 lb 13.6 oz (84.3 kg)  11/12/22 186 lb (84.4 kg)   BP Readings from Last 3 Encounters:  11/16/22 112/66  11/12/22 109/72  11/12/22 126/71   Immunization History  Administered Date(s) Administered   Fluad Quad(high Dose 65+) 05/24/2019, 07/07/2021   Influenza Split 05/10/2012, 05/07/2015   Influenza Whole 05/10/2008   Influenza, High Dose Seasonal PF 06/09/2010, 08/07/2011, 05/27/2016, 06/22/2017, 06/15/2018, 04/12/2020   Influenza,inj,Quad PF,6+ Mos 05/11/2014   Influenza,inj,quad, With Preservative 05/25/2017   Influenza-Unspecified 05/11/2007, 05/24/2008, 05/31/2009, 05/11/2012, 05/18/2013, 05/10/2014, 05/05/2015, 05/10/2017, 07/11/2019, 04/12/2020, 06/24/2022   Moderna Sars-Covid-2 Vaccination 10/17/2019, 11/14/2019, 08/01/2020   Pneumococcal Conjugate-13 12/07/2014,  05/14/2015   Pneumococcal Polysaccharide-23 10/31/2007, 12/02/2016, 10/09/2021   Pneumococcal-Unspecified 05/10/2006, 06/09/2010   Td 10/29/2008   Tdap 03/22/2019   Zoster Recombinat (Shingrix) 08/10/2017, 10/08/2017, 11/10/2017, 01/11/2018   Health Maintenance Due  Topic Date Due   COVID-19 Vaccine (4 - 2023-24 season) 04/10/2022      Past Medical History:  Diagnosis Date   AICD (automatic cardioverter/defibrillator) present    ALLERGIC RHINITIS 10/31/2007   Qualifier: Diagnosis of  By: Jonny Ruiz MD, Len Blalock    Allergy    Cancer 12/03/2021   non-Hodgkin's B-cell lymphoma   CHOLECYSTECTOMY, HX OF 06/04/2007   Qualifier: Diagnosis of  By: Genelle Gather CMA, [place]     Chronic HFrEF (heart failure with reduced ejection fraction)    COLONIC POLYPS, HX OF 10/31/2007   Qualifier: Diagnosis of  By: Jonny Ruiz MD, Len Blalock    CORONARY ARTERY BYPASS GRAFT, HX OF 06/04/2007   Qualifier: Diagnosis of  By: Genelle Gather CMA, [place]     CORONARY ARTERY DISEASE 06/04/2007   Qualifier: Diagnosis of  By: Genelle Gather CMA, [place]     Diverticulosis    Erectile dysfunction    Eye twitch    right eye since chilhood    History of hiatal hernia    HYPERLIPIDEMIA 06/04/2007   Qualifier: Diagnosis of  By: Genelle Gather CMA, [place]     Hypersomnolence    HYPERTENSION 06/04/2007   Qualifier: Diagnosis of  By: Genelle Gather CMA, [place]     Hypothyroidism    ISCHEMIC CARDIOMYOPATHY 06/04/2007   Qualifier:  Diagnosis of  By: Genelle Gather CMA, [place]     Ischemic cardiomyopathy    LV (left ventricular) mural thrombus 02/15/2019   Myocardial infarction    per patient , his occurred in 1998    Osteoarthritis    Peripheral vascular disease    PLMD (periodic limb movement disorder)    Pneumonia    Vertigo    Vitamin D deficiency    Past Surgical History:  Procedure Laterality Date   BIOPSY  11/25/2021   Procedure: BIOPSY;  Surgeon: Lynann Bologna, MD;  Location: [facility];  Service: Gastroenterology;;   CABG  1999   was done at the  [facility] in [place], [place].  Patient reports that a small vein was taken from under his arm and a small incesion was made.   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 05/20/2022   Procedure: LIGATION OF POPLITEAL ARTERY ANEURYSM;  Surgeon: Cephus Shelling, MD;  Location: [facility] OR;  Service: Vascular;  Laterality: Right;   ESOPHAGOGASTRODUODENOSCOPY (EGD) WITH PROPOFOL N/A 11/25/2021   Procedure: ESOPHAGOGASTRODUODENOSCOPY (EGD) WITH PROPOFOL;  Surgeon: Lynann Bologna, MD;  Location: [facility];  Service: Gastroenterology;  Laterality: N/A;   FEMORAL-POPLITEAL BYPASS GRAFT Right 05/20/2022   Procedure: RIGHT ABOVE KNEE POPLITEAL ARTERY TO TIBIOPERONEAL TRUNK/POSTERIOR TIBIAL ARTERY BYPASS;  Surgeon: Cephus Shelling, MD;  Location: [facility] OR;  Service: Vascular;  Laterality: Right;   ICD IMPLANT N/A 01/30/2020   Procedure: ICD IMPLANT;  Surgeon: Hillis Range, MD;  Location: [facility] INVASIVE CV LAB;  Service: Cardiovascular;  Laterality: N/A;   IR IMAGING GUIDED PORT INSERTION  12/04/2021   KNEE ARTHROPLASTY Left 08/05/2017   Procedure: LEFT TOTAL KNEE ARTHROPLASTY WITH COMPUTER NAVIGATION;  Surgeon: Samson Frederic, MD;  Location: [facility] ORS;  Service: Orthopedics;  Laterality: Left;  Needs RNFA   LIGATION OF CILIAC ARTERY Right 05/20/2022   Procedure: LIGATION OF RIGHT ANTERIOR TIBIAL ARTERY;  Surgeon: Cephus Shelling, MD;  Location: [facility] OR;  Service: Vascular;  Laterality: Right;   LOWER EXTREMITY ANGIOGRAPHY Right 05/14/2022   Procedure: Lower Extremity Angiography;  Surgeon: Cephus Shelling, MD;  Location: [facility] INVASIVE CV LAB;  Service: Cardiovascular;  Laterality: Right;   PENILE PROSTHESIS IMPLANT     POLYPECTOMY  11/25/2021   Procedure:  POLYPECTOMY;  Surgeon: Lynann Bologna, MD;  Location: [facility];  Service: Gastroenterology;;   REPLACEMENT TOTAL KNEE  06/12/2013   spinal cyst     THORACOTOMY     left anterior; wound exploration and debridement   TONSILLECTOMY AND ADENOIDECTOMY     age 81   VEIN HARVEST Right 05/20/2022   Procedure: RIGHT GREATER SAPHENOUS VEIN HARVEST;  Surgeon: Cephus Shelling, MD;  Location: [facility] OR;  Service: Vascular;  Laterality: Right;    reports that he quit smoking about 41 years ago. His smoking use included cigarettes. He has a 68.00 pack-year smoking history. He has never been exposed to tobacco smoke. He has never used smokeless tobacco. He reports current alcohol use of about 2.0 standard drinks of alcohol per week. He reports that he does not use drugs. family history includes COPD in his son; Colon cancer in his maternal grandfather; Heart disease in his brother; Lung cancer in his father; Pancreatic cancer in his brother, cousin, and maternal uncle; Stomach cancer  in his mother. Allergies  Allergen Reactions   Ace Inhibitors Cough   Statins Other (See Comments)    Muscle pains   Current Outpatient Medications on File Prior to Visit  Medication Sig Dispense Refill   Ascorbic Acid (VITA-C PO) Take 1,000 mg by mouth daily.     aspirin EC 81 MG tablet Take 1 tablet (81 mg total) by mouth daily at 6 (six) AM. Swallow whole. 30 tablet 12   b complex vitamins capsule Take 1 capsule by mouth daily.     ezetimibe (ZETIA) 10 MG tablet TAKE 1 TABLET EVERY DAY (Patient taking differently: Take 10 mg by mouth daily.) 90 tablet 3   feeding supplement (ENSURE ENLIVE / ENSURE PLUS) LIQD Take 237 mLs by mouth 3 (three) times daily between meals. 237 mL 12   furosemide (LASIX) 20 MG tablet Take 2 tablets (40 mg total) by mouth daily. 180 tablet 3   losartan (COZAAR) 25 MG tablet Take 1 tablet (25 mg total) by mouth daily. 90 tablet 3   meloxicam (MOBIC) 15 MG tablet Take 15 mg by mouth daily.      metoprolol succinate (TOPROL-XL) 50 MG 24 hr tablet Take 1 tablet (50 mg total) by mouth daily. 90 tablet 1   Multiple Vitamins-Minerals (MULTIVITAMIN WITH MINERALS) tablet Take 1 tablet by mouth daily. Centrum silver     rosuvastatin (CRESTOR) 5 MG tablet TAKE 1 TABLET EVERY OTHER DAY 45 tablet 3   warfarin (COUMADIN) 5 MG tablet Take 1 tablet daily except 1 1/2 tablets on Mondays or as directed. (Patient taking differently: Take 2.5-5 mg by mouth See admin instructions. Take 5 mg  tablet daily EXCEPT: Tuesday take 2.5 mg  in the morning or as directed.) 90 tablet 5   No current facility-administered medications on file prior to visit.        ROS:  All others reviewed and negative.  Objective        PE:  BP 112/66   Pulse 92   Temp 97.6 F (36.4 C) (Oral)   Ht 5\' 11"  (1.803 m)   Wt 189 lb (85.7 kg)   SpO2 93%   BMI 26.36 kg/m                 Constitutional: Pt appears in NAD               HENT: Head: NCAT.                Right Ear: External ear normal.                 Left Ear: External ear normal.                Eyes: . Pupils are equal, round, and reactive to light. Conjunctivae and EOM are normal               Nose: without d/c or deformity               Neck: Neck supple. Gross normal ROM               Cardiovascular: Normal rate and regular rhythm.                 Pulmonary/Chest: Effort normal and breath sounds without rales or wheezing.                Abd:  Soft, NT, ND, + BS, no organomegaly  Neurological: Pt is alert. At baseline orientation, motor grossly intact               Skin: Skin is warm. No rashes, no other new lesions, LE edema - none               Psychiatric: Pt behavior is normal without agitation   Micro: none  Cardiac tracings I have personally interpreted today:  none  Pertinent Radiological findings (summarize): none   Lab Results  Component Value Date   WBC 9.3 11/12/2022   HGB 12.6 (L) 11/12/2022   HCT 38.5 (L) 11/12/2022   PLT  180 11/12/2022   GLUCOSE 154 (H) 11/12/2022   CHOL 109 05/21/2022   TRIG 73 05/21/2022   HDL 29 (L) 05/21/2022   LDLDIRECT 102.0 10/09/2021   LDLCALC 65 05/21/2022   ALT 119 (H) 11/12/2022   AST 122 (H) 11/12/2022   NA 137 11/12/2022   K 3.8 11/12/2022   CL 101 11/12/2022   CREATININE 1.21 11/12/2022   BUN 28 (H) 11/12/2022   CO2 27 11/12/2022   TSH 2.95 10/09/2021   PSA 1.23 10/09/2021   INR 2.6 10/20/2022   HGBA1C 5.2 05/20/2022   Assessment/Plan:  Tamala BariWilliam T Rhodes is a 77 y.o. White or Caucasian [1] male with  has a past medical history of AICD (automatic cardioverter/defibrillator) present, ALLERGIC RHINITIS (10/31/2007), Allergy, Cancer (12/03/2021), CHOLECYSTECTOMY, HX OF (06/04/2007), Chronic HFrEF (heart failure with reduced ejection fraction), COLONIC POLYPS, HX OF (10/31/2007), CORONARY ARTERY BYPASS GRAFT, HX OF (06/04/2007), CORONARY ARTERY DISEASE (06/04/2007), Diverticulosis, Erectile dysfunction, Eye twitch, History of hiatal hernia, HYPERLIPIDEMIA (06/04/2007), Hypersomnolence, HYPERTENSION (06/04/2007), Hypothyroidism, ISCHEMIC CARDIOMYOPATHY (06/04/2007), Ischemic cardiomyopathy, LV (left ventricular) mural thrombus (02/15/2019), Myocardial infarction, Osteoarthritis, Peripheral vascular disease, PLMD (periodic limb movement disorder), Pneumonia, Vertigo, and Vitamin D deficiency.  Encounter for well adult exam with abnormal findings Age and sex appropriate education and counseling updated with regular exercise and diet Referrals for preventative services - none needed Immunizations addressed - declines covid booster Smoking counseling  - none needed Evidence for depression or other mood disorder - none significant Most recent labs reviewed. I have personally reviewed and have noted: 1) the patient's medical and social history 2) The patient's current medications and supplements 3) The patient's height, weight, and BMI have been recorded in the chart   Acute  sinus infection Improved, but suspect will need more than 7 days tx - will give additional for total 14 day tx  PNA (pneumonia) Bilateral lower lobe, Improved, but suspect will need more than 7 days tx - will give additional for total 14 day tx  Hyperglycemia Lab Results  Component Value Date   HGBA1C 5.2 05/20/2022   Stable, pt to continue current medical treatment  - diet, wt control   Essential hypertension BP Readings from Last 3 Encounters:  11/16/22 112/66  11/12/22 109/72  11/12/22 126/71   Stable, pt to continue medical treatment losartan 25 mg qd, toprol xl 50 mg qd  Followup: Return in about 6 months (around 05/18/2023).  Oliver BarreJames Alexande Sheerin, MD 11/16/2022 5:03 PM [facility]  [facility]

## 2022-11-16 NOTE — Assessment & Plan Note (Signed)
Bilateral lower lobe, Improved, but suspect will need more than 7 days tx - will give additional for total 14 day tx

## 2022-11-16 NOTE — Assessment & Plan Note (Signed)
BP Readings from Last 3 Encounters:  11/16/22 112/66  11/12/22 109/72  11/12/22 126/71   Stable, pt to continue medical treatment losartan 25 mg qd, toprol xl 50 mg qd

## 2022-11-16 NOTE — Patient Instructions (Signed)
We can extend the antibiotic to total 14 days  Please continue all other medications as before, and refills have been done if requested.  Please have the pharmacy call with any other refills you may need.  Please keep your appointments with your specialists as you may have planned  Please make an Appointment to return in 6 months, or sooner if needed

## 2022-11-17 ENCOUNTER — Ambulatory Visit: Payer: No Typology Code available for payment source | Attending: Cardiology | Admitting: *Deleted

## 2022-11-17 DIAGNOSIS — Z5181 Encounter for therapeutic drug level monitoring: Secondary | ICD-10-CM | POA: Diagnosis not present

## 2022-11-17 DIAGNOSIS — I513 Intracardiac thrombosis, not elsewhere classified: Secondary | ICD-10-CM | POA: Diagnosis not present

## 2022-11-17 LAB — POCT INR: INR: 1.4 — AB (ref 2.0–3.0)

## 2022-11-17 NOTE — Patient Instructions (Signed)
Finished chemo Was off warfarin for surgery on 5/1 but that was cancelled due to him having pneumonia Take warfarin 1 tablet tonight then resume 1 tablet daily except 1/2 tablet on Tuesdays Continue greens Recheck in 2 wks Pending colonoscopy 4/18.  Nothing scheduled in computer yet.  He will call today to see if the want him to hold his warfarin.

## 2022-11-17 NOTE — Progress Notes (Signed)
EPIC Encounter for ICM Monitoring  Patient Name: [name] [name] is a 77 y.o. male Date: 11/17/2022 Primary Care Physican: Corwin Levins, MD Primary Cardiologist: Jens Som Electrophysiologist: [place] 04/20/2022 Not weighing at home 05/07/2022 Weight: 181 lbs (does not weigh consistently at home) 09/29/2022 Office Weight: 190 lbs 11/17/2022 Weight: 185 lbs                                                           Spoke with patient and heart failure questions reviewed.  Transmission results reviewed.  Pt feels weak due to pneumonia and also has sinus infection.  ED visit 4/4 with dx of pneumonia.       DIET:  Pt uses salt shaker at home and does not follow low salt diet.  Also eats restaurant foods.     Optivol thoracic impedance suggesting fluid levels returned to normal from 3/7 to 3/23 and possible fluid recurred starting 3/24.   Unstable fluid levels since 04/2022   Prescribed: Furosemide 20 mg take 2 tablet(s) (40 mg total) by mouth daily   Spironolactone 25 mg take 1 tablet by mouth daily Jardiance 10 mg take 1 tablet by mouth daily   Labs: 11/12/2022 Creatinine 1.21, BUN 28, Potassium 3.8, Sodium 137, GFR >60 09/25/2022 Creatinine 1.37, BUN 30, Potassium 3.8, Sodium 3.8, GFR 53 08/13/2022 Creatinine 1.04, BUN 21, Potassium 3.5, Sodium 138  A complete set of results can be found in Results Review.   Recommendations:    Taking antibiotics for pneumonia.  Advised to limit salt intake.   Follow-up plan: ICM clinic phone appointment on 11/23/2022 to recheck fluid levels.   91 day device clinic remote transmission 01/26/2023.     EP/Cardiology Office Visits:   Recall 08/23/2023 with Dr Jens Som.  07/25/2023 with Dr Nelly Laurence.    Copy of ICM check sent to Dr. Nelly Laurence.       3 month ICM trend: 11/16/2022.    12-14 Month ICM trend:     Karie Soda, RN 11/17/2022 3:02 PM

## 2022-11-18 ENCOUNTER — Telehealth: Payer: Self-pay

## 2022-11-18 NOTE — Progress Notes (Signed)
[facility] [address], [place] [address]    Clinic Day:  11/19/2022  Referring physician: Corwin Levins, MD  Patient Care Team: Corwin Levins, MD as PCP - General Jens Som Madolyn Frieze, MD as PCP - Cardiology (Cardiology) Mealor, Roberts Gaudy, MD as PCP - Electrophysiology (Cardiology) Doreatha Massed, MD as Medical Oncologist (Medical Oncology) Therese Sarah, RN as Oncology Nurse Navigator (Medical Oncology)   ASSESSMENT & PLAN:   Assessment: 1. Stage IVb high-grade B-cell lymphoma: - He had unintentional weight loss of 20 pounds since September 2022.  Weight has been stable for the last 2 months.  He had multiple family members with pancreatic cancer. - CT AP with contrast (10/31/2021): Abnormal nodular thickening of the right anterior pararenal fascia, suspicious for atypical malignancy.  No evidence of pancreatic malignancy.  Irregular wall thickening of the proximal stomach.  No ascites or peritoneal nodularity. - Biopsy of nodular thickening (11/17/2021): High-grade B-cell lymphoma with Burkitt-like features.  Ki-67 100%. 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  Neoplastic lymphocytes positive for CD20, CD10, Bcl-2, BCL6 and mum 1.  Ki-67 more than 95%.  H pylori IHC negative. - High-grade lymphoma panel (11/17/2021): Negative for BCL6, MYC, BCL2 rearrangements, negative for MYC amplification, t(8:14) not detected. - High risk features for CNS disease: Age more than 60, stage III/IV, extranodal involvement more than 1 site.  Based on 3 points, intermediate risk.  No clinical signs of CNS involvement. - IPI: High intermediate with 3 points (age more than 60, stage III/IV, extranodal involvement more than 1 site) - 6 cycles of R-CEOP from 12/15/2021 through 03/31/2022 - PET scan (04/30/2022): No evidence of lymphoma (Deauville 1 response).   2. Social/family history: - He  has AICD placed in June 2021 secondary to CHF. - He is a retired Cytogeneticist.  He worked at [employer] in [place].  He had exposure to some chemicals.  He quit smoking more than 40 years ago.  He drinks mostly beer and occasionally whiskey at bedtime.   3. Ischemic cardiomyopathy/CHF/apical thrombus: - He is on Coumadin.  Will check PT/INR today. - Last seen by Dr. Jens Som on 11/06/2021.   -Last echo on 12/27/2020 with EF 30-35%.  Limited visualization of endocardium.  LV has moderately decreased function.  Plan: Stage IVb high-grade B-cell lymphoma: - He was recently diagnosed with pneumonia and sinusitis on 11/12/2022.  He had black-colored sputum which has cleared up now.  He is also feeling much better but still weak.  He received 1 week of cefdinir and doxycycline. - I will give him Augmentin 875 mg twice daily for 7 days.  He was told to stop cefdinir after 7 days.  He will continue doxycycline 100 mg twice daily for another 7 days. - Reviewed PET scan (11/12/2022): No hypermetabolic adenopathy in the neck, chest, abdomen or pelvis or bones to suggest recurrent lymphoma.  Diffuse pulmonary infiltrates suggesting atypical infection. - Reviewed labs from 11/12/2022: Elevated AST and ALT of 122 and 119.  LDH is elevated at 394, likely from pneumonia. - Recommend repeating LFTs in 2 weeks to make sure they have come down. - RTC 3 months with repeat CT of the chest with contrast.  Orders Placed This Encounter  Procedures   CT Chest W Contrast    Standing Status:   Future    Standing Expiration Date:   11/19/2023    Order Specific Question:  If indicated for the ordered procedure, I authorize the administration of contrast media per Radiology protocol    Answer:   Yes    Order Specific Question:   Does the patient have a contrast media/X-ray dye allergy?    Answer:   No    Order Specific Question:   Preferred imaging location?    Answer:   [facility]   Lactate dehydrogenase    Standing  Status:   Future    Standing Expiration Date:   11/19/2023   Hepatic function panel    Standing Status:   Future    Standing Expiration Date:   11/19/2023      I,Katie Daubenspeck,acting as a scribe for Doreatha Massed, MD.,have documented all relevant documentation on the behalf of Doreatha Massed, MD,as directed by  Doreatha Massed, MD while in the presence of Doreatha Massed, MD.   I, Doreatha Massed MD, have reviewed the above documentation for accuracy and completeness, and I agree with the above.   Doreatha Massed, MD   4/11/20243:08 PM  CHIEF COMPLAINT:   Diagnosis: stage IVb high-grade B-cell lymphoma    Cancer Staging  High grade B-cell lymphoma Staging form: Hodgkin and Non-Hodgkin Lymphoma, AJCC 8th Edition - Clinical stage from 12/03/2021: Stage IV (Diffuse large B-cell lymphoma) - Unsigned    Prior Therapy: R-CEOP q21d x 6 Cycles   Current Therapy:  surveillance   HISTORY OF PRESENT ILLNESS:   Oncology History  High grade B-cell lymphoma  12/03/2021 Initial Diagnosis   High grade B-cell lymphoma (HCC)   12/15/2021 - 04/03/2022 Chemotherapy   Patient is on Treatment Plan : NON-HODGKIN'S LYMPHOMA R-CEOP q21d x 3 Cycles        INTERVAL HISTORY:   [name] [name] is a 77 y.o. male presenting to clinic today for follow up of stage IVb high-grade B-cell lymphoma. He was last seen by me on 08/13/22.  Since his last visit, he underwent restaging PET scan on 11/12/22 showing: no hypermetabolic adenopathy; diffuse hypermetabolic pulmonary infiltrates suggesting atypical infection; mild diffuse hypermetabolic marrow, no discrete bone lesions.  Following his scan, he presented here for lab draw. At that time, he reported increased weakness, low grade fever, and coughing up black sputum. He was referred to the ED. A chest x-ray showed patchy and pulmonary infiltrates suspicious for pneumonia. He was discharged with a course of cefdinir and  doxycycline.  Today, he states that he is doing well overall. His appetite level is at 60%. His energy level is at 20%.  PAST MEDICAL HISTORY:   Past Medical History: Past Medical History:  Diagnosis Date   AICD (automatic cardioverter/defibrillator) present    ALLERGIC RHINITIS 10/31/2007   Qualifier: Diagnosis of  By: Jonny Ruiz MD, Len Blalock    Allergy    Cancer 12/03/2021   non-Hodgkin's B-cell lymphoma   CHOLECYSTECTOMY, HX OF 06/04/2007   Qualifier: Diagnosis of  By: Genelle Gather CMA, [place]     Chronic HFrEF (heart failure with reduced ejection fraction)    COLONIC POLYPS, HX OF 10/31/2007   Qualifier: Diagnosis of  By: Jonny Ruiz MD, Len Blalock    CORONARY ARTERY BYPASS GRAFT, HX OF 06/04/2007   Qualifier: Diagnosis of  By: Genelle Gather CMA, [place]     CORONARY ARTERY DISEASE 06/04/2007   Qualifier: Diagnosis of  By: Genelle Gather CMA, [place]     Diverticulosis    Erectile dysfunction    Eye twitch    right eye since chilhood    History of hiatal hernia    HYPERLIPIDEMIA  06/04/2007   Qualifier: Diagnosis of  By: Genelle Gather CMA, [place]     Hypersomnolence    HYPERTENSION 06/04/2007   Qualifier: Diagnosis of  By: Genelle Gather CMA, [place]     Hypothyroidism    ISCHEMIC CARDIOMYOPATHY 06/04/2007   Qualifier: Diagnosis of  By: Genelle Gather CMA, [place]     Ischemic cardiomyopathy    LV (left ventricular) mural thrombus 02/15/2019   Myocardial infarction    per patient , his occurred in 1998    Osteoarthritis    Peripheral vascular disease    PLMD (periodic limb movement disorder)    Pneumonia    Vertigo    Vitamin D deficiency     Surgical History: Past Surgical History:  Procedure Laterality Date   BIOPSY  11/25/2021   Procedure: BIOPSY;  Surgeon: Lynann Bologna, MD;  Location: [facility];  Service: Gastroenterology;;   CABG  1999   was done at the [facility] in [place], [place].  Patient reports that a small vein was taken from under his arm and a small incesion was made.   CHOLECYSTECTOMY      COLONOSCOPY     COLONOSCOPY WITH PROPOFOL N/A 11/25/2021   Procedure: COLONOSCOPY WITH PROPOFOL;  Surgeon: Lynann Bologna, MD;  Location: [facility];  Service: Gastroenterology;  Laterality: N/A;   ENDOVASCULAR REPAIR OF POPLITEAL ARTERY ANEURYSM Right 05/20/2022   Procedure: LIGATION OF POPLITEAL ARTERY ANEURYSM;  Surgeon: Cephus Shelling, MD;  Location: [facility] OR;  Service: Vascular;  Laterality: Right;   ESOPHAGOGASTRODUODENOSCOPY (EGD) WITH PROPOFOL N/A 11/25/2021   Procedure: ESOPHAGOGASTRODUODENOSCOPY (EGD) WITH PROPOFOL;  Surgeon: Lynann Bologna, MD;  Location: [facility];  Service: Gastroenterology;  Laterality: N/A;   FEMORAL-POPLITEAL BYPASS GRAFT Right 05/20/2022   Procedure: RIGHT ABOVE KNEE POPLITEAL ARTERY TO TIBIOPERONEAL TRUNK/POSTERIOR TIBIAL ARTERY BYPASS;  Surgeon: Cephus Shelling, MD;  Location: [facility] OR;  Service: Vascular;  Laterality: Right;   ICD IMPLANT N/A 01/30/2020   Procedure: ICD IMPLANT;  Surgeon: Hillis Range, MD;  Location: [facility] INVASIVE CV LAB;  Service: Cardiovascular;  Laterality: N/A;   IR IMAGING GUIDED PORT INSERTION  12/04/2021   KNEE ARTHROPLASTY Left 08/05/2017   Procedure: LEFT TOTAL KNEE ARTHROPLASTY WITH COMPUTER NAVIGATION;  Surgeon: Samson Frederic, MD;  Location: [facility];  Service: Orthopedics;  Laterality: Left;  Needs RNFA   LIGATION OF CILIAC ARTERY Right 05/20/2022   Procedure: LIGATION OF RIGHT ANTERIOR TIBIAL ARTERY;  Surgeon: Cephus Shelling, MD;  Location[facility] OR;  Service: Vascular;  Laterality: Right;   LOWER EXTREMITY ANGIOGRAPHY Right 05/14/2022   Procedure: Lower Extremity Angiography;  Surgeon: Cephus Shelling, MD;  Location[facility] CV LAB;  Service: Cardiovascular;  Laterality: Right;   PENILE PROSTHESIS IMPLANT     POLYPECTOMY  11/25/2021   Procedure: POLYPECTOMY;  Surgeon: Lynann Bologna, MD;  Location: [facility];  Service: Gastroenterology;;   REPLACEMENT TOTAL KNEE  06/12/2013   spinal cyst      THORACOTOMY     left anterior; wound exploration and debridement   TONSILLECTOMY AND ADENOIDECTOMY     age 22   VEIN HARVEST Right 05/20/2022   Procedure: RIGHT GREATER SAPHENOUS VEIN HARVEST;  Surgeon: Cephus Shelling, MD;  Location: [facility] OR;  Service: Vascular;  Laterality: Right;    Social History: Social History   Socioeconomic History   Marital status: Married    Spouse name: Kathie Rhodes   Number of children: 3   Years of education: Not on file   Highest education level: Not on file  Occupational  History   Occupation: truck Air traffic controller: [employer]  Tobacco Use   Smoking status: Former    Packs/day: 4.00    Years: 17.00    Additional pack years: 0.00    Total pack years: 68.00    Types: Cigarettes    Quit date: 05/07/1981    Years since quitting: 41.5    Passive exposure: Never   Smokeless tobacco: Never   Tobacco comments:    Pt states that he would let most of them "burn" pt states that he used anywhere between 4-5PPD  Vaping Use   Vaping Use: Never used  Substance and Sexual Activity   Alcohol use: Yes    Alcohol/week: 2.0 standard drinks of alcohol    Types: 2 Cans of beer per week   Drug use: No   Sexual activity: Not on file  Other Topics Concern   Not on file  Social History Narrative   Lives in [place] with spouse   Retired Naval architect   Social Determinants of Health   Financial Resource Strain: Low Risk  (10/29/2022)   Overall Financial Resource Strain (CARDIA)    Difficulty of Paying Living Expenses: Not hard at all  Food Insecurity: No Food Insecurity (10/29/2022)   Hunger Vital Sign    Worried About Running Out of Food in the Last Year: Never true    Ran Out of Food in the Last Year: Never true  Transportation Needs: No Transportation Needs (10/29/2022)   PRAPARE - Administrator, Civil Service (Medical): No    Lack of Transportation (Non-Medical): No  Physical Activity: Inactive (10/29/2022)   Exercise Vital Sign     Days of Exercise per Week: 0 days    Minutes of Exercise per Session: 0 min  Stress: No Stress Concern Present (10/29/2022)   [employer] of Occupational Health - Occupational Stress Questionnaire    Feeling of Stress : Not at all  Social Connections: Socially Integrated (10/29/2022)   Social Connection and Isolation Panel [NHANES]    Frequency of Communication with Friends and Family: More than three times a week    Frequency of Social Gatherings with Friends and Family: More than three times a week    Attends Religious Services: More than 4 times per year    Active Member of [employer] or Organizations: Yes    Attends Engineer, structural: More than 4 times per year    Marital Status: Married  Catering manager Violence: Not At Risk (10/29/2022)   Humiliation, Afraid, Rape, and Kick questionnaire    Fear of Current or Ex-Partner: No    Emotionally Abused: No    Physically Abused: No    Sexually Abused: No    Family History: Family History  Problem Relation Age of Onset   Stomach cancer Mother        smokes   Lung cancer Father        chewed tobacco   Heart disease Brother        first MI at 20yo, now 22 for transplant list/ ICM   Pancreatic cancer Brother    Colon cancer Maternal Grandfather    COPD Son        was a smoker   Pancreatic cancer Maternal Uncle    Pancreatic cancer Cousin        mat side x 4   Esophageal cancer Neg Hx    Prostate cancer Neg Hx    Rectal cancer Neg Hx  Current Medications:  Current Outpatient Medications:    amoxicillin-clavulanate (AUGMENTIN) 875-125 MG tablet, Take 1 tablet by mouth 2 (two) times daily., Disp: 14 tablet, Rfl: 0   Ascorbic Acid (VITA-C PO), Take 1,000 mg by mouth daily., Disp: , Rfl:    aspirin EC 81 MG tablet, Take 1 tablet (81 mg total) by mouth daily at 6 (six) AM. Swallow whole., Disp: 30 tablet, Rfl: 12   b complex vitamins capsule, Take 1 capsule by mouth daily., Disp: , Rfl:    doxycycline (VIBRAMYCIN)  100 MG capsule, Take 1 capsule (100 mg total) by mouth 2 (two) times daily for 7 days., Disp: 14 capsule, Rfl: 0   ezetimibe (ZETIA) 10 MG tablet, TAKE 1 TABLET EVERY DAY (Patient taking differently: Take 10 mg by mouth daily.), Disp: 90 tablet, Rfl: 3   feeding supplement (ENSURE ENLIVE / ENSURE PLUS) LIQD, Take 237 mLs by mouth 3 (three) times daily between meals., Disp: 237 mL, Rfl: 12   furosemide (LASIX) 20 MG tablet, Take 2 tablets (40 mg total) by mouth daily., Disp: 180 tablet, Rfl: 3   losartan (COZAAR) 25 MG tablet, Take 1 tablet (25 mg total) by mouth daily., Disp: 90 tablet, Rfl: 3   meloxicam (MOBIC) 15 MG tablet, Take 15 mg by mouth daily., Disp: , Rfl:    metoprolol succinate (TOPROL-XL) 50 MG 24 hr tablet, Take 1 tablet (50 mg total) by mouth daily., Disp: 90 tablet, Rfl: 1   Multiple Vitamins-Minerals (MULTIVITAMIN WITH MINERALS) tablet, Take 1 tablet by mouth daily. Centrum silver, Disp: , Rfl:    rosuvastatin (CRESTOR) 5 MG tablet, TAKE 1 TABLET EVERY OTHER DAY, Disp: 45 tablet, Rfl: 3   warfarin (COUMADIN) 5 MG tablet, Take 1 tablet daily except 1 1/2 tablets on Mondays or as directed. (Patient taking differently: Take 2.5-5 mg by mouth See admin instructions. Take 5 mg  tablet daily EXCEPT: Tuesday take 2.5 mg  in the morning or as directed.), Disp: 90 tablet, Rfl: 5   Allergies: Allergies  Allergen Reactions   Ace Inhibitors Cough   Statins Other (See Comments)    Muscle pains    REVIEW OF SYSTEMS:   Review of Systems  Constitutional:  Positive for fatigue. Negative for chills and fever.  HENT:   Negative for lump/mass, mouth sores, nosebleeds, sore throat and trouble swallowing.   Eyes:  Negative for eye problems.  Respiratory:  Positive for cough. Negative for shortness of breath.   Cardiovascular:  Negative for chest pain, leg swelling and palpitations.  Gastrointestinal:  Positive for nausea. Negative for abdominal pain, constipation, diarrhea and vomiting.   Genitourinary:  Negative for bladder incontinence, difficulty urinating, dysuria, frequency, hematuria and nocturia.   Musculoskeletal:  Negative for arthralgias, back pain, flank pain, myalgias and neck pain.  Skin:  Negative for itching and rash.  Neurological:  Positive for headaches. Negative for dizziness and numbness.  Hematological:  Does not bruise/bleed easily.  Psychiatric/Behavioral:  Positive for sleep disturbance. Negative for depression and suicidal ideas. The patient is not nervous/anxious.   All other systems reviewed and are negative.    VITALS:   Blood pressure 103/75, pulse 65, temperature 97.7 F (36.5 C), temperature source Oral, resp. rate 16, weight 186 lb (84.4 kg), SpO2 97 %.  Wt Readings from Last 3 Encounters:  11/19/22 186 lb (84.4 kg)  11/16/22 189 lb (85.7 kg)  11/12/22 185 lb 13.6 oz (84.3 kg)    Body mass index is 25.94 kg/m.  Performance status (ECOG):  1 - Symptomatic but completely ambulatory  PHYSICAL EXAM:   Physical Exam Vitals and nursing note reviewed. Exam conducted with a chaperone present.  Constitutional:      Appearance: Normal appearance.  Cardiovascular:     Rate and Rhythm: Normal rate and regular rhythm.     Pulses: Normal pulses.     Heart sounds: Normal heart sounds.  Pulmonary:     Effort: Pulmonary effort is normal.     Breath sounds: Normal breath sounds.  Abdominal:     Palpations: Abdomen is soft. There is no hepatomegaly, splenomegaly or mass.     Tenderness: There is no abdominal tenderness.  Musculoskeletal:     Right lower leg: No edema.     Left lower leg: No edema.  Lymphadenopathy:     Cervical: No cervical adenopathy.     Right cervical: No superficial, deep or posterior cervical adenopathy.    Left cervical: No superficial, deep or posterior cervical adenopathy.     Upper Body:     Right upper body: No supraclavicular or axillary adenopathy.     Left upper body: No supraclavicular or axillary adenopathy.   Neurological:     General: No focal deficit present.     Mental Status: He is alert and oriented to person, place, and time.  Psychiatric:        Mood and Affect: Mood normal.        Behavior: Behavior normal.     LABS:      Latest Ref Rng & Units 11/12/2022   10:38 AM 08/13/2022   10:13 AM 05/22/2022    2:04 AM  CBC  WBC 4.0 - 10.5 K/uL 9.3  5.7  7.6   Hemoglobin 13.0 - 17.0 g/dL 51.812.6  84.112.9  8.7   Hematocrit 39.0 - 52.0 % 38.5  40.3  27.3   Platelets 150 - 400 K/uL 180  137  88       Latest Ref Rng & Units 11/12/2022   10:38 AM 09/25/2022   11:18 AM 08/13/2022   10:13 AM  CMP  Glucose 70 - 99 mg/dL 660154  630135  160116   BUN 8 - 23 mg/dL 28  30  21    Creatinine 0.61 - 1.24 mg/dL 1.091.21  3.231.37  5.571.04   Sodium 135 - 145 mmol/L 137  139  138   Potassium 3.5 - 5.1 mmol/L 3.8  3.8  3.5   Chloride 98 - 111 mmol/L 101  103  101   CO2 22 - 32 mmol/L 27  28  28    Calcium 8.9 - 10.3 mg/dL 8.5  8.9  8.6   Total Protein 6.5 - 8.1 g/dL 6.6   6.8   Total Bilirubin 0.3 - 1.2 mg/dL 1.1   1.0   Alkaline Phos 38 - 126 U/L 157   61   AST 15 - 41 U/L 122   19   ALT 0 - 44 U/L 119   14      No results found for: "CEA1", "CEA" / No results found for: "CEA1", "CEA" No results found for: "PSA1" No results found for: "DUK025CAN199" No results found for: "CAN125"  No results found for: "TOTALPROTELP", "ALBUMINELP", "A1GS", "A2GS", "BETS", "BETA2SER", "GAMS", "MSPIKE", "SPEI" Lab Results  Component Value Date   IRONPCTSAT 23.0 03/22/2019   Lab Results  Component Value Date   LDH 394 (H) 11/12/2022   LDH 93 (L) 08/13/2022   LDH 110 05/12/2022     STUDIES:   CT  Chest W Contrast  Result Date: 11/12/2022 CLINICAL DATA:  Pneumonia.  History of lymphoma. EXAM: CT CHEST WITH CONTRAST TECHNIQUE: Multidetector CT imaging of the chest was performed during intravenous contrast administration. RADIATION DOSE REDUCTION: This exam was performed according to the departmental dose-optimization program which includes  automated exposure control, adjustment of the mA and/or kV according to patient size and/or use of iterative reconstruction technique. CONTRAST:  75mL OMNIPAQUE IOHEXOL 300 MG/ML  SOLN COMPARISON:  PET-CT scan earlier 11/12/2022. CT angiogram chest 01/19/2022 FINDINGS: Cardiovascular: Right upper chest port. The port is accessed. There is left chest battery pack with defibrillator lead extending along the right side of the heart. The heart is nonenlarged. No pericardial effusion. Significant coronary artery calcifications. There are calcifications along the aortic valve. The thoracic aorta has a normal course and caliber with scattered vascular calcifications. Mediastinum/Nodes: Atrophic thyroid gland. Mildly patulous esophagus. No specific abnormal lymph node enlargement seen in the axillary regions, hilum or mediastinum. Slight left-sided gynecomastia. Lungs/Pleura: Hyperinflation. No pneumothorax or effusion. Once again there are reticular changes seen scattered in both lungs with bronchial wall thickening. These are patchy particularly along the inferior margins of the upper lobes and lower lobes. Some tree-in-bud like changes as well. Some mild consolidative areas along the lower lobes. The changes are increased from the study of June 2023 and September 2023. Upper Abdomen: The adrenal glands are preserved in the upper abdomen. Fatty liver infiltration. Previous cholecystectomy. Slightly nodular contours of the liver. The spleen is nonenlarged. Musculoskeletal: Curvature of the spine. Scattered degenerative changes. IMPRESSION: Increasing reticular changes in both lungs with bronchial wall thickening and tree-in-bud like changes. Some consolidative changes along the lower lobes. No pleural effusion. Recommend short follow-up. Chest port.  Defibrillator. No developing lymph node enlargement. Please see separate PET-CT scan from same day. Aortic Atherosclerosis (ICD10-I70.0). Electronically Signed   By: Karen Kays M.D.   On: 11/12/2022 17:11   NM PET Image Restag (PS) Skull Base To Thigh  Result Date: 11/12/2022 CLINICAL DATA:  Subsequent treatment strategy for high-grade B-cell lymphoma. EXAM: NUCLEAR MEDICINE PET SKULL BASE TO THIGH TECHNIQUE: 9.91 mCi F-18 FDG was injected intravenously. Full-ring PET imaging was performed from the skull base to thigh after the radiotracer. CT data was obtained and used for attenuation correction and anatomic localization. Fasting blood glucose: 181 mg/dl COMPARISON:  PET-CT 13/03/6577 and 02/05/2022. FINDINGS: Mediastinal blood pool activity: SUV max 2.11 Liver activity: SUV max 3.04 NECK: No hypermetabolic lymph nodes in the neck. Incidental CT findings: There are air-fluid levels in both maxillary sinuses suggesting acute sinusitis. CHEST: No hypermetabolic mediastinal or hilar nodes. No suspicious pulmonary nodules on the CT scan. No hypermetabolic supraclavicular or axillary adenopathy. There are multifocal pulmonary infiltrates with hypermetabolism. On the CT scan and there are also tree-in-bud type changes suggesting atypical infection such as MAC. No findings suspicious for intrapulmonary lymphoma. Incidental CT findings: The pacer wires are stable. Stable advanced vascular disease. ABDOMEN/PELVIS: No abnormal hypermetabolic activity within the liver, pancreas, adrenal glands, or spleen. No hypermetabolic lymph nodes in the abdomen or pelvis. Incidental CT findings: Surgical changes cholecystectomy. Stable vascular disease. No focal aneurysm. Penile prosthesis again noted. SKELETON: Mild diffuse hypermetabolic marrow possibly related to rebound from chemotherapy or marrow stimulating drugs. No discrete bone lesions are identified on the CT scan. Incidental CT findings: None. IMPRESSION: 1. No hypermetabolic adenopathy in the neck, chest, abdomen/pelvis or bony structures to suggest recurrent lymphoma. 2. Diffuse hypermetabolic pulmonary infiltrates and tree-in-bud type  changes suggesting  atypical infection such as MAC. Recommend follow-up noncontrast chest CT in 3 months after appropriate treatment. 3. Mild diffuse hypermetabolic marrow possibly related to chemotherapy or marrow stimulating drugs. No discrete bone lesions are identified on the CT scan. 4. Bilateral maxillary sinusitis. Electronically Signed   By: Rudie Meyer M.D.   On: 11/12/2022 14:30   DG Chest Port 1 View  Result Date: 11/12/2022 CLINICAL DATA:  Productive cough. EXAM: PORTABLE CHEST 1 VIEW COMPARISON:  Chest x-ray 01/19/2022 FINDINGS: The cardiac silhouette, mediastinal and hilar contours are within normal limits and stable. Stable right IJ power port and stable pacer wires. Patchy and pulmonary infiltrates are noted suspicious for pneumonia. IMPRESSION: Patchy and pulmonary infiltrates suspicious for pneumonia. Electronically Signed   By: Rudie Meyer M.D.   On: 11/12/2022 14:02   CUP PACEART REMOTE DEVICE CHECK  Result Date: 10/28/2022 Scheduled remote reviewed. Normal device function.  Next remote 91 days. 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 EXAM: CT ANGIOGRAPHY OF ABDOMINAL AORTA WITH ILIOFEMORAL RUNOFF TECHNIQUE: Multidetector CT imaging of the abdomen, pelvis and lower extremities was performed using the standard protocol during bolus administration of intravenous contrast. Multiplanar CT image reconstructions and MIPs were obtained to evaluate the vascular anatomy. RADIATION DOSE REDUCTION: This exam was performed according to the departmental dose-optimization program which includes automated exposure control, adjustment of the mA and/or kV according to patient size and/or use of iterative reconstruction technique. CONTRAST:  OMNIPAQUE IOHEXOL 350 MG/ML SOLN COMPARISON:   Prior runoff 03/03/2022 FINDINGS: VASCULAR Aorta: Trace atherosclerotic calcifications. No aneurysm or dissection. Celiac: Patent without evidence of aneurysm, dissection, vasculitis or significant stenosis. SMA: Patent without evidence of aneurysm, dissection, vasculitis or significant stenosis. Renals: Solitary right renal artery. Calcified plaque results in mild stenosis at the artery origin. No changes of fibromuscular dysplasia. There are at least 3 left-sided renal arteries including 2 tiny arteries to the upper pole and a larger dominant renal artery arising inferior from the aorta. Calcified plaque at the origin of the dominant artery results in mild stenosis. The smaller accessory arteries are too small for accurate characterization. IMA: Patent without evidence of aneurysm, dissection, vasculitis or significant stenosis. RIGHT Lower Extremity Inflow: Common, internal and external iliac arteries are patent without evidence of aneurysm, dissection, vasculitis or significant stenosis. Outflow: The common femoral, profunda femoral and superficial femoral arteries are all normal. Partially imaged surgical changes of saphenous vein bypass graft beginning at the P1 segment of the popliteal artery and extending to the tibioperoneal trunk. The bypass graft appears widely patent. The mid segment is not well seen due to severe streak artifact from the knee arthroplasty prosthesis. There is some collateral filling of the previously identified popliteal artery aneurysm. The origin of the anterior tibial artery is ligated, however the vessel reconstitutes via collateral flow in demonstrates trace flow. Overall, the excluded popliteal aneurysm is decreased in size measuring no more than 4 cm compared to 4.7 cm previously. Runoff: The peroneal artery is widely patent to the ankle. The posterior tibial artery is also patent but occludes at the ankle and then reconstitutes as the common plantar artery. The reconstituted  anterior tibial artery remains patent to the dorsalis pedis artery. LEFT Lower Extremity Inflow: Common, internal and external iliac arteries are patent without evidence of aneurysm, dissection,  vasculitis or significant stenosis. Outflow: Normal appearance of the common femoral, profunda femoral and superficial femoral arteries. Scattered plaque but no stenosis. The popliteal artery is partially visualized due to extensive streak artifact from the knee arthroplasty prosthesis. It appears patent. No aneurysm. Runoff: 2 vessel runoff to the ankle from the peroneal and posterior tibial arteries. The anterior tibial artery occludes just above the ankle. Veins: No focal venous abnormality. Review of the MIP images confirms the above findings. NON-VASCULAR Lower chest: No acute abnormality. Hepatobiliary: No focal liver abnormality is seen. Status post cholecystectomy. No biliary dilatation. Pancreas: Unremarkable. No pancreatic ductal dilatation or surrounding inflammatory changes. Spleen: Normal in size without focal abnormality. Adrenals/Urinary Tract: Adrenal glands are unremarkable. Kidneys are normal, without renal calculi, focal lesion, or hydronephrosis. Bladder is unremarkable. Stomach/Bowel: Stomach is within normal limits. Appendix appears normal. No evidence of bowel wall thickening, distention, or inflammatory changes. Lymphatic: No suspicious lymphadenopathy. Reproductive: Unremarkable prostate gland. Penile prosthesis is present. Other: No abdominal wall hernia or abnormality. No abdominopelvic ascites. Musculoskeletal: Circumscribed fluid attenuation collection in the superficial fat overlying the musculature in the medial aspect of the right distal thigh measures 12.3 x 3.4 x 6.7 cm (volume = 150 mL). There is a second smaller fluid collection measuring approximately 8.6 x 2.6 x 2.3 cm (volume = 27 mL) within the medial aspect of the right lower extremity just below the knee which appears to extend from  the region of the bypass out laterally over the gastrocnemius muscle. Fluid is low in attenuation. No acute fracture or aggressive appearing lytic or blastic osseous lesion. Bilateral knee arthroplasty prostheses. Chronic L3 compression fracture with approximately 50% height loss. IMPRESSION: VASCULAR 1. Interval surgical changes of saphenous vein bypass graft around the right popliteal artery aneurysm extending from the P1 segment of the popliteal artery to the distal tibioperoneal trunk. The bypass graft is only partially imaged due to extensive streak artifact from the knee arthroplasty prosthesis obscuring the mid segment. However, the bypass is widely patent without evidence of complication at the proximal or distal anastomosis. 2. Trace retrograde filling is present within the superior aspect of the native popliteal artery, however the bypassed aneurysm appears fully thrombosed and has decreased in size to 4 cm compared to 4.7 cm prior to surgery. 3. Intentional ligation of the origin of the anterior tibial artery. The AT a reconstitutes via collateral flow and remains patent to the ankle. 4. Additional atherosclerotic vascular disease and areas of stenosis as detailed above without significant interval change. NON-VASCULAR 1. Two postoperative fluid collections are present in the right lower extremity. The larger collection is a circumscribed ovoid water attenuation fluid collection in the superficial fat overlying the musculature of the distal medial right thigh and measures up to 150 mL. This likely represents a postoperative seroma. The second, smaller fluid collection measures approximately 27 mL and is located in the anteromedial aspect of the right lower leg just below the knee. This fluid collection is less well-defined and tracks from the distal anastomosis site anterior and medial over the medial head of the gastrocnemius muscle. This may represent a small postoperative hematoma or seroma. 2. Chronic  L3 compression fracture. 3. Additional ancillary findings as above without significant interval change. Signed, Sterling Big, MD, RPVI [facility] Radiology Electronically Signed   By: Malachy Moan M.D.   On: 10/23/2022 08:46

## 2022-11-18 NOTE — Telephone Encounter (Signed)
     Patient  visit on 4/4  at [place]   Have you been able to follow up with your primary care physician? Yes   The patient was or was not able to obtain any needed medicine or equipment. Yes   Are there diet recommendations that you are having difficulty following? Na   Patient expresses understanding of discharge instructions and education provided has no other needs at this time.  Yes      Kristopher Attwood [facility] Guide, [place] [phone] [address], [place], [place] [address] Phone: [phone] Email: [email or web address]    

## 2022-11-19 ENCOUNTER — Inpatient Hospital Stay: Payer: No Typology Code available for payment source | Admitting: Hematology

## 2022-11-19 VITALS — BP 103/75 | HR 65 | Temp 97.7°F | Resp 16 | Wt 186.0 lb

## 2022-11-19 DIAGNOSIS — C8339 Diffuse large B-cell lymphoma, extranodal and solid organ sites: Secondary | ICD-10-CM | POA: Diagnosis not present

## 2022-11-19 DIAGNOSIS — R748 Abnormal levels of other serum enzymes: Secondary | ICD-10-CM | POA: Diagnosis not present

## 2022-11-19 DIAGNOSIS — C8510 Unspecified B-cell lymphoma, unspecified site: Secondary | ICD-10-CM

## 2022-11-19 MED ORDER — AMOXICILLIN-POT CLAVULANATE 875-125 MG PO TABS: 1.0000 | ORAL_TABLET | Freq: Two times a day (BID) | ORAL | 0 refills | Status: DC

## 2022-11-19 NOTE — Patient Instructions (Addendum)
[facility] at [facility] Discharge Instructions   You were seen and examined today by Dr. Ellin Saba.  He reviewed the results of your PET scan. There is no evidence of lymphoma seen on the exam.   He reviewed the results of your lab work which are mostly normal/stable. Your liver enzymes are elevated. This could be related to your recent infection with pneumonia. We will monitor this with lab work prior to your next visit.   Dr. Kirtland Bouchard sent an antibiotic that he would like you to take in place of the [facility] that was prescribed. This antibiotic is called Augmentin. You will take this twice a day for seven days. Continue taking doxycycline twice a day as prescribed.   We will see you back in 3 monts. We will repeat lab work and a chest CT prior to your next visit.    Thank you for choosing [facility] at [facility] to provide your oncology and hematology care.  To afford each patient quality time with our provider, please arrive at least 15 minutes before your scheduled appointment time.   If you have a lab appointment with the Cancer Center please come in thru the Main Entrance and check in at the main information desk.  You need to re-schedule your appointment should you arrive 10 or more minutes late.  We strive to give you quality time with our providers, and arriving late affects you and other patients whose appointments are after yours.  Also, if you no show three or more times for appointments you may be dismissed from the clinic at the providers discretion.     Again, thank you for choosing [facility].  Our hope is that these requests will decrease the amount of time that you wait before being seen by our physicians.       _____________________________________________________________  Should you have questions after your visit to [facility], please contact our office at [phone] and follow the prompts.  Our  office hours are 8:00 a.m. and 4:30 p.m. Monday - Friday.  Please note that voicemails left after 4:00 p.m. may not be returned until the following business day.  We are closed weekends and major holidays.  You do have access to a nurse 24-7, just call the main number to the clinic [phone] and do not press any options, hold on the line and a nurse will answer the phone.    For prescription refill requests, have your pharmacy contact our office and allow 72 hours.    Due to Covid, you will need to wear a mask upon entering the hospital. If you do not have a mask, a mask will be given to you at the Main Entrance upon arrival. For doctor visits, patients may have 1 support person age 39 or older with them. For treatment visits, patients can not have anyone with them due to social distancing guidelines and our immunocompromised population.

## 2022-11-23 ENCOUNTER — Other Ambulatory Visit: Payer: Self-pay

## 2022-11-23 ENCOUNTER — Emergency Department (HOSPITAL_COMMUNITY): Payer: No Typology Code available for payment source

## 2022-11-23 ENCOUNTER — Ambulatory Visit: Payer: No Typology Code available for payment source | Attending: Cardiovascular Disease

## 2022-11-23 ENCOUNTER — Encounter (HOSPITAL_COMMUNITY): Payer: Self-pay | Admitting: *Deleted

## 2022-11-23 ENCOUNTER — Emergency Department (HOSPITAL_COMMUNITY)
Admission: EM | Admit: 2022-11-23 | Discharge: 2022-11-23 | Disposition: A | Discharge status: Home / Self Care | Payer: No Typology Code available for payment source | Attending: Emergency Medicine | Admitting: Emergency Medicine

## 2022-11-23 DIAGNOSIS — Z7982 Long term (current) use of aspirin: Secondary | ICD-10-CM | POA: Insufficient documentation

## 2022-11-23 DIAGNOSIS — Z7901 Long term (current) use of anticoagulants: Secondary | ICD-10-CM | POA: Diagnosis not present

## 2022-11-23 DIAGNOSIS — R531 Weakness: Secondary | ICD-10-CM | POA: Diagnosis present

## 2022-11-23 DIAGNOSIS — I5042 Chronic combined systolic (congestive) and diastolic (congestive) heart failure: Secondary | ICD-10-CM

## 2022-11-23 DIAGNOSIS — Z8572 Personal history of non-Hodgkin lymphomas: Secondary | ICD-10-CM | POA: Diagnosis not present

## 2022-11-23 DIAGNOSIS — Z95810 Presence of automatic (implantable) cardiac defibrillator: Secondary | ICD-10-CM | POA: Diagnosis not present

## 2022-11-23 LAB — CBC WITH DIFFERENTIAL/PLATELET
Abs Immature Granulocytes: 0.01 10*3/uL (ref 0.00–0.07)
Basophils Absolute: 0.1 10*3/uL (ref 0.0–0.1)
Basophils Relative: 1 %
Eosinophils Absolute: 0.1 10*3/uL (ref 0.0–0.5)
Eosinophils Relative: 2 %
HCT: 39.5 % (ref 39.0–52.0)
Hemoglobin: 12.7 g/dL — ABNORMAL LOW (ref 13.0–17.0)
Immature Granulocytes: 0 %
Lymphocytes Relative: 30 %
Lymphs Abs: 1.6 10*3/uL (ref 0.7–4.0)
MCH: 30.5 pg (ref 26.0–34.0)
MCHC: 32.2 g/dL (ref 30.0–36.0)
MCV: 95 fL (ref 80.0–100.0)
Monocytes Absolute: 0.5 10*3/uL (ref 0.1–1.0)
Monocytes Relative: 9 %
Neutro Abs: 3.2 10*3/uL (ref 1.7–7.7)
Neutrophils Relative %: 58 %
Platelets: 250 10*3/uL (ref 150–400)
RBC: 4.16 MIL/uL — ABNORMAL LOW (ref 4.22–5.81)
RDW: 13.8 % (ref 11.5–15.5)
WBC: 5.5 10*3/uL (ref 4.0–10.5)
nRBC: 0 % (ref 0.0–0.2)

## 2022-11-23 LAB — COMPREHENSIVE METABOLIC PANEL
ALT: 35 U/L (ref 0–44)
AST: 34 U/L (ref 15–41)
Albumin: 3.4 g/dL — ABNORMAL LOW (ref 3.5–5.0)
Alkaline Phosphatase: 85 U/L (ref 38–126)
Anion gap: 9 (ref 5–15)
BUN: 42 mg/dL — ABNORMAL HIGH (ref 8–23)
CO2: 25 mmol/L (ref 22–32)
Calcium: 8.5 mg/dL — ABNORMAL LOW (ref 8.9–10.3)
Chloride: 101 mmol/L (ref 98–111)
Creatinine, Ser: 1.68 mg/dL — ABNORMAL HIGH (ref 0.61–1.24)
GFR, Estimated: 42 mL/min — ABNORMAL LOW (ref >60)
Glucose, Bld: 121 mg/dL — ABNORMAL HIGH (ref 70–99)
Potassium: 3.9 mmol/L (ref 3.5–5.1)
Sodium: 135 mmol/L (ref 135–145)
Total Bilirubin: 0.4 mg/dL (ref 0.3–1.2)
Total Protein: 6.9 g/dL (ref 6.5–8.1)

## 2022-11-23 LAB — BRAIN NATRIURETIC PEPTIDE: B Natriuretic Peptide: 82 pg/mL (ref 0.0–100.0)

## 2022-11-23 LAB — PROTIME-INR
INR: 1.7 — ABNORMAL HIGH (ref 0.8–1.2)
Prothrombin Time: 19.7 seconds — ABNORMAL HIGH (ref 11.4–15.2)

## 2022-11-23 LAB — CBG MONITORING, ED: Glucose-Capillary: 128 mg/dL — ABNORMAL HIGH (ref 70–99)

## 2022-11-23 MED ORDER — LOSARTAN POTASSIUM 25 MG PO TABS: 12.5000 mg | ORAL_TABLET | Freq: Every day | ORAL | 0 refills | Status: DC

## 2022-11-23 MED ORDER — HEPARIN SOD (PORK) LOCK FLUSH 100 UNIT/ML IV SOLN
500.0000 [IU] | Freq: Once | INTRAVENOUS | Status: AC
  Administered 2022-11-23: 500 [IU]
  Filled 2022-11-23: qty 5

## 2022-11-23 MED ORDER — SODIUM CHLORIDE 0.9 % IV SOLN: INTRAVENOUS | Status: DC

## 2022-11-23 NOTE — ED Triage Notes (Signed)
Pt with nausea and generalized weakness continued, recently dx'd with PNA. Pt states his HR in the 30's. Denies CP or SOB. Pt with ICD, denies it firing.

## 2022-11-23 NOTE — ED Provider Notes (Signed)
[place] EMERGENCY DEPARTMENT AT [facility] Provider Note   CSN: [id number] Arrival date & time: 11/23/22  1106     History  Chief Complaint  Patient presents with   Bradycardia    [name] [name] is a 77 y.o. male.  HPI Patient presents with a family member who assists with the history.  Patient was diagnosed with pneumonia 2 weeks ago.  [facility]  There he was found to have heart rate variability including lows in the 30s, and was sent here for evaluation. Patient has ICD in place, history of heart failure and lymphoma.     Home Medications Prior to Admission medications   Medication Sig Start Date End Date Taking? Authorizing Provider  amoxicillin-clavulanate (AUGMENTIN) 875-125 MG tablet Take 1 tablet by mouth 2 (two) times daily. 11/19/22   Doreatha Massed, MD  Ascorbic Acid (VITA-C PO) Take 1,000 mg by mouth daily.    [provider]  aspirin EC 81 MG tablet Take 1 tablet (81 mg total) by mouth daily at 6 (six) AM. Swallow whole. 05/23/22   Baglia, Corrina, PA-C  b complex vitamins capsule Take 1 capsule by mouth daily.    [provider]  doxycycline (VIBRAMYCIN) 100 MG capsule Take 1 capsule (100 mg total) by mouth 2 (two) times daily for 7 days. 11/16/22 11/23/22  Corwin Levins, MD  ezetimibe (ZETIA) 10 MG tablet TAKE 1 TABLET EVERY DAY Patient taking differently: Take 10 mg by mouth daily. 07/21/21   Lewayne Bunting, MD  feeding supplement (ENSURE ENLIVE / ENSURE PLUS) LIQD Take 237 mLs by mouth 3 (three) times daily between meals. 01/21/22   Catarina Hartshorn, MD  furosemide (LASIX) 20 MG tablet Take 2 tablets (40 mg total) by mouth daily. 06/23/22   Lewayne Bunting, MD  losartan (COZAAR) 25 MG tablet Take 0.5 tablets (12.5 mg total) by mouth daily. 11/23/22 11/18/23  Gerhard Munch, MD  meloxicam (MOBIC) 15 MG tablet Take 15 mg by mouth daily.    [provider]  metoprolol  succinate (TOPROL-XL) 50 MG 24 hr tablet Take 1 tablet (50 mg total) by mouth daily. 09/23/22   Lewayne Bunting, MD  Multiple Vitamins-Minerals (MULTIVITAMIN WITH MINERALS) tablet Take 1 tablet by mouth daily. Centrum silver    [provider]  rosuvastatin (CRESTOR) 5 MG tablet TAKE 1 TABLET EVERY OTHER DAY 08/06/22   Lewayne Bunting, MD  warfarin (COUMADIN) 5 MG tablet Take 1 tablet daily except 1 1/2 tablets on Mondays or as directed. Patient taking differently: Take 2.5-5 mg by mouth See admin instructions. Take 5 mg  tablet daily EXCEPT: Tuesday take 2.5 mg  in the morning or as directed. 06/23/22   Lewayne Bunting, MD      Allergies    Ace inhibitors and Statins    Review of Systems   Review of Systems  All other systems reviewed and are negative.   Physical Exam Updated Vital Signs BP (!) 98/58   Pulse 61   Temp 98 F (36.7 C) (Oral)   Resp 18   Ht 5\' 11"  (1.803 m)   Wt 82.1 kg   SpO2 93%   BMI 25.24 kg/m  Physical Exam Vitals and nursing note reviewed.  Constitutional:      General: He is not in acute distress.    Appearance: He is well-developed.  HENT:     Head: Normocephalic and atraumatic.  Eyes:  Conjunctiva/sclera: Conjunctivae normal.  Cardiovascular:     Rate and Rhythm: Normal rate and regular rhythm.  Pulmonary:     Effort: Pulmonary effort is normal. No respiratory distress.     Breath sounds: No stridor.  Chest:    Abdominal:     General: There is no distension.  Skin:    General: Skin is warm and dry.  Neurological:     Mental Status: He is alert and oriented to person, place, and time.     ED Results / Procedures / Treatments   Labs (all labs ordered are listed, but only abnormal results are displayed) Labs Reviewed  COMPREHENSIVE METABOLIC PANEL - Abnormal; Notable for the following components:      Result Value   Glucose, Bld 121 (*)    BUN 42 (*)    Creatinine, Ser 1.68 (*)    Calcium 8.5 (*)    Albumin 3.4 (*)     GFR, Estimated 42 (*)    All other components within normal limits  CBC WITH DIFFERENTIAL/PLATELET - Abnormal; Notable for the following components:   RBC 4.16 (*)    Hemoglobin 12.7 (*)    All other components within normal limits  PROTIME-INR - Abnormal; Notable for the following components:   Prothrombin Time 19.7 (*)    INR 1.7 (*)    All other components within normal limits  CBG MONITORING, ED - Abnormal; Notable for the following components:   Glucose-Capillary 128 (*)    All other components within normal limits  BRAIN NATRIURETIC PEPTIDE    EKG EKG Interpretation  Date/Time:  Monday November 23 2022 12:44:06 EDT Ventricular Rate:  63 PR Interval:  185 QRS Duration: 130 QT Interval:  502 QTC Calculation: 514 R Axis:   -68 Text Interpretation: Sinus rhythm Atrial premature complex Nonspecific IVCD with LAD Anterolateral infarct, age indeterminate Abnormal ECG Confirmed by Gerhard Munch [phone]) on 11/23/2022 12:50:03 PM  Radiology DG Chest Port 1 View  Result Date: 11/23/2022 CLINICAL DATA:  LH? EXAM: PORTABLE CHEST 1 VIEW COMPARISON:  11/12/22 CXR FINDINGS: Left-sided dual lead cardiac device in place with unchanged lead positioning. Right chest port in place with tip at the cavoatrial junction. No pleural effusion. No pneumothorax. There are hazy bibasilar airspace opacities, left-greater-than-right, which could represent atelectasis or infection. Unchanged cardiac and mediastinal contours. No radiographically apparent displaced rib fractures. Visualized upper abdomen is unremarkable. IMPRESSION: Hazy bibasilar airspace opacities, left-greater-than-right, which could represent atelectasis or infection. Electronically Signed   By: Lorenza Cambridge M.D.   On: 11/23/2022 12:56    Procedures Procedures    Medications Ordered in ED Medications  0.9 %  sodium chloride infusion ( Intravenous New Bag/Given 11/23/22 1321)    ED Course/ Medical Decision Making/ A&P                              Medical Decision Making Adult male presents with nausea, weakness the context of recent pneumonia.  Patient found to have bradycardia at outside facility.  Differential includes infection, electrolyte abnormalities, less likely ACS or defibrillator malfunction.  Monitor 65 variable abnormal Pulse ox 96% room air normal   Amount and/or Complexity of Data Reviewed Independent Historian: caregiver External Data Reviewed: notes. Labs: ordered. Decision-making details documented in ED Course. Radiology: ordered and independent interpretation performed. Decision-making details documented in ED Course. ECG/medicine tests: ordered and independent interpretation performed. Decision-making details documented in ED Course.  Risk Prescription drug management. Decision  regarding hospitalization.   3:39 PM On repeat evaluation patient in no distress, awake, alert, speaking clearly.  He has evidence for mild dehydration, has had no bradycardia here.  Discussed this case with the defibrillator [employer] rep.  I have reviewed the paper chart as well.  No evidence for sustained bradycardia but the patient has had multiple PAC including as many as 600 and a 1 hour..  Some suspicion for this contributing to reported arrhythmia/bradycardia at his physicians office as he has had none here on monitor for several hours. Patient has remained awake, alert, in no distress, continues to deny chest pain, cough, fever.  Patient's x-ray consistent with recovery phase of pneumonia given his denial of ongoing respiratory complaints.  After discussion with our cardiology colleague, Dr. Diona Browner, patient will have adjustment in his medications, will follow-up with his own cardiologist later this week.        Final Clinical Impression(s) / ED Diagnoses Final diagnoses:  Weakness    Rx / DC Orders ED Discharge Orders          Ordered    losartan (COZAAR) 25 MG tablet  Daily        11/23/22 1539               Gerhard Munch, MD 11/23/22 1540

## 2022-11-23 NOTE — Discharge Instructions (Addendum)
As discussed, with your weakness, recovery from pneumonia, and possible slow heart rhythm it is important to follow-up with your cardiologist and primary care physician.  Please reduce your Cozaar (losartan) dosing to 12.5 mg daily until you have seen your physicians.  Return here for concerning changes in your condition.

## 2022-11-24 ENCOUNTER — Telehealth: Payer: Self-pay

## 2022-11-24 NOTE — Telephone Encounter (Signed)
Remote ICM transmission received.  Spoke with patient and he advised he was in the dentist office and requested a call back.

## 2022-11-24 NOTE — Progress Notes (Unsigned)
EPIC Encounter for ICM Monitoring  Patient Name: [name] [name] is a 77 y.o. male Date: 11/24/2022 Primary Care Physican: Corwin Levins, MD Primary Cardiologist: Jens Som Electrophysiologist: [place] 04/20/2022 Not weighing at home 05/07/2022 Weight: 181 lbs (does not weigh consistently at home) 09/29/2022 Office Weight: 190 lbs 11/17/2022 Weight: 185 lbs                                                           Spoke with patient and heart failure questions reviewed.  Transmission results reviewed.  Pt reports ED visit and was given fluid due to dehydration.   ED visit 4/4 with dx of pneumonia and 4/15 for weakness.       DIET:  Pt uses salt shaker at home and does not follow low salt diet.  Also eats restaurant foods.     Optivol thoracic impedance suggesting fluid levels returned to normal 4/9 but was showing slight possible dryness.   Unstable fluid levels since 04/2022.  Fluid index greater than normal from 04/26/2022-11/27/2022.    Prescribed: Furosemide 20 mg take 2 tablet(s) (40 mg total) by mouth daily   Spironolactone 25 mg take 1 tablet by mouth daily Jardiance 10 mg take 1 tablet by mouth daily   Labs: 11/23/2022 Creatinine 1.68, BUN 42, Potassium 3.9, Sodium 135, GFR 42 11/12/2022 Creatinine 1.21, BUN 28, Potassium 3.8, Sodium 137, GFR >60 09/25/2022 Creatinine 1.37, BUN 30, Potassium 3.8, Sodium 3.8, GFR 53 08/13/2022 Creatinine 1.04, BUN 21, Potassium 3.5, Sodium 138  A complete set of results can be found in Results Review.   Recommendations:  Advised to drink approximately 64 oz to stay hydrated.  He plans to call Dr Ludwig Clarks office for follow up appointment following ED visit.    Follow-up plan: ICM clinic phone appointment on 12/21/2022.   91 day device clinic remote transmission 01/26/2023.     EP/Cardiology Office Visits:   Recall 08/23/2023 with Dr Jens Som.  07/25/2023 with Dr Nelly Laurence.    Copy of ICM check sent to Dr. Nelly Laurence.     3 month ICM trend:  11/23/2022.    12-14 Month ICM trend:     Karie Soda, RN 11/24/2022 1:44 PM

## 2022-11-26 ENCOUNTER — Telehealth: Payer: Self-pay

## 2022-11-26 NOTE — Telephone Encounter (Signed)
     Patient  visit on 4/15  at [place]  Have you been able to follow up with your primary care physician? Yes   The patient was or was not able to obtain any needed medicine or equipment. Yes   Are there diet recommendations that you are having difficulty following? Na   Patient expresses understanding of discharge instructions and education provided has no other needs at this time.  Yes      Lyndel Dancel [facility] Care Guide, [place] [phone] [address], [place], [place] [address] Phone: [phone] Email: [email or web address]    

## 2022-12-01 ENCOUNTER — Ambulatory Visit: Payer: No Typology Code available for payment source | Attending: Cardiology | Admitting: *Deleted

## 2022-12-01 DIAGNOSIS — I513 Intracardiac thrombosis, not elsewhere classified: Secondary | ICD-10-CM

## 2022-12-01 DIAGNOSIS — Z5181 Encounter for therapeutic drug level monitoring: Secondary | ICD-10-CM | POA: Diagnosis not present

## 2022-12-01 LAB — POCT INR: INR: 2.7 (ref 2.0–3.0)

## 2022-12-01 NOTE — Progress Notes (Signed)
Cardiology Clinic Note   Date: 12/02/2022 ID: Basim, Bartnik 04/17/1946, MRN [id number]  Primary Cardiologist:  Olga Millers, MD  Patient Profile    [name] [name] is a 77 y.o. male who presents to the clinic today for hospital follow-up.  Past medical history significant for: CAD. CABG x 1 1998: LIMA to LAD. Nuclear stress test 01/31/2021: There is a medium defect of severe severity present in the mid anterior, mid anteroseptal, apical anterior, apical septal and apex location.  There is a medium defect of moderate severity present in the mid inferior, mid inferolateral, apical inferior and apical lateral location.  Findings consistent with prior MI in the LAD and RCA territories.  LVEF is severely decreased (<30%). Chronic systolic heart failure/ischemic cardiomyopathy. ICD implantation 01/30/2020: Medtronic Evera MRI XT DR ICD. Remote check 10/27/2022: Normal device function.  Lead parameters battery within normal limits. Echo 01/12/2022: EF 30 to 35%.  Left ventricular diastolic parameters indeterminate.  Akinesis of the left ventricular, mid-apical anteroseptal wall, inferior septal wall and apical segment.  Mild LAE.  Trivial MR.  Mild AI/AS.  No significant change from prior echo May 2022. Mural thrombus of left ventricle. Cardiac MRI 02/15/2019: Left ventricle apical thrombus present, approximately 7 x 7 mm.  Severely reduced LV systolic function, arrhythmia limits calculation of EF, visually suggested EF 30%.  Global hypokinesis apical and mid apical anterior and anteroseptal akinesis, scar, thinning and early aneurysm formation.  Transmural delayed enhancement in the anterior and anteroseptal walls from mid ventricle to apex and transmural delayed enhancement in the apex.  Transmural delayed enhancement suggest infarction without viability in this area of the myocardium.  Mildly reduced right ventricle systolic function. Aneurysm of right popliteal artery/PVD. Lower extremity  angiography 05/14/2022: Widely patent common femoral, profunda, and SFA with a large popliteal aneurysm behind the knee.  The aneurysm extends down to the takeoff of the anterior tibial that appears and the anterior tibial has a short segment occlusion in the proximal calf and then reconstitutes and appears diseased.  Dominant runoff is through the TP trunk with the peroneal and posterior tibial artery.  The peroneal does occlude distally above the ankle as does the PT at the ankle with no in-line flow into the foot.  Plan for right lower extremity bypass. Right above-knee popliteal artery to tibioperoneal trunk/posterior tibial artery bypass with ipsilateral reversed great saphenous vein, ligation of right popliteal artery aneurysm, ligation of right anterior tibial artery 05/20/2022. Lower extremity arterial ultrasound/ABI 09/29/2022: Right: Widely patent bypass graft without evidence of stenosis.  Low velocity noted in the native inflow artery.  Continued large cystic structure without vascularity in the mid-distal thigh.  Right waveforms have improved from the last exam. Hypertension. Hyperlipidemia. Lipid panel 05/21/2022: LDL 65, HDL 29, TG 73, total 109. Hypothyroidism. Lymphoma.   History of Present Illness    [name] [name] is a longtime patient of cardiology followed by Dr. Jens Som and EP for the above outlined history.  Patient was last seen in the office by Dr. Jens Som on 08/28/2022.  He was doing well at that time.  Patient had 2 recent ED visits.  He presented to the ED on 11/12/2022 with complaints of weakness and spitting up black sputum.  Patient negative for COVID, flu and RSV.  Patient diagnosed with pneumonia, treated with Zithromax and Rocephin, and discharged home.  Patient returned to the ED on 11/23/2022 with complaints of nausea and generalized weakness, with heart rate in the 30s.  EGD showed  sinus rhythm with PACs, heart rate 63 bpm.  Dr. Diona Browner was consulted and  medications were adjusted.  Today, patient is accompanied by his wife.  He reports he is not able to walk the same distance as he was prior to hospitalization secondary to feeling weak but not short of breath.  He states as long as he is able to rest he can continue on.  He feels this is slowly improving.  He states since medication adjustments in the hospital he is no longer having dizziness with position changes.  He denies lower extremity edema, orthopnea, PND.  He denies blood in urine or stool or other bleeding concerns.    ROS: All other systems reviewed and are otherwise negative except as noted in History of Present Illness.  Studies Reviewed    ECG is not ordered today.         Physical Exam    VS:  BP 110/72 (BP Location: Left Arm, Patient Position: Sitting, Cuff Size: Normal)   Pulse 61   Ht  (1.803 m)   Wt 191 lb 3.2 oz (86.7 kg)   SpO2 97%   BMI 26.67 kg/m  , BMI Body mass index is 26.67 kg/m.  GEN: Well nourished, well developed, in no acute distress. Neck: No JVD or carotid bruits. Cardiac:  RRR. No murmurs. No rubs or gallops.   Respiratory:  Respirations regular and unlabored. Clear to auscultation without rales, wheezing or rhonchi. GI: Soft, nontender, nondistended. Extremities: Radials/DP/PT 2+ and equal bilaterally. No clubbing or cyanosis. No edema.  Skin: Warm and dry, no rash. Neuro: Strength intact.  Assessment & Plan    CAD.  S/p CABG x 1 1998.  Nuclear stress test June 2022 findings consistent with prior MI in the LAD and RCA territories.  Patient denies chest pain, tightness, pressure.  Continue aspirin, rosuvastatin, ezetimibe, metoprolol. Chronic systolic heart failure/ischemic cardiomyopathy.  S/p ICD implantation June 2021.  Remote check March 2024 showed normal device function with stable lead and battery parameters.  Patient denies lower extremity edema, DOE, orthopnea, PND.  Euvolemic and well compensated on exam.  Slightly diminished  breath sounds right base.  Patient with a history of intolerance to [facility].  Continue metoprolol, losartan, Lasix. Bradycardia.  Patient recently seen in the ED April 2024 with heart rate in the 30s.  Medications were adjusted at that time.  Patient reports since medication adjustment he is no longer having dizziness with position changes and feels his heart rate has improved.  Heart rate today 61.  Continue metoprolol 12.5 mg daily. Mural thrombus of left ventricle. Cardiac MRI July 2020 showed left ventricle apical thrombus approximately 7 x 7 mm.  Patient denies spontaneous bleeding concerns.  INR therapeutic at last check.  Continue Coumadin. Aneurysm of right popliteal artery.  S/P bypass and ligation of right popliteal artery aneurysm/right anterior tibial artery October 2023.  Arterial ultrasound/ABI February 2024 showed patent bypass.  Patient reports some swelling medial knee/thigh.  He is awaiting a procedure to address this with vascular surgery.  Continue to follow with vascular surgery. Hypertension.  BP today 110/72.  Patient denies headaches or dizziness.  Continue losartan and metoprolol. Hyperlipidemia.  LDL October 2023 65, at goal.  Continue rosuvastatin and ezetimibe.  Disposition: Return in 3 months or sooner as needed.         Signed, Etta Grandchild. Zymir Napoli, DNP, NP-C

## 2022-12-01 NOTE — Patient Instructions (Signed)
Finished chemo Continue warfarin 1 tablet daily except 1/2 tablet on Tuesdays Continue greens Recheck in 3 wks Pending surgery on Rt leg 5/31

## 2022-12-02 ENCOUNTER — Encounter: Payer: Self-pay | Admitting: Student

## 2022-12-02 ENCOUNTER — Ambulatory Visit: Payer: Medicare HMO | Attending: Student | Admitting: Student

## 2022-12-02 VITALS — BP 110/72 | HR 61 | Ht 71.0 in | Wt 191.2 lb

## 2022-12-02 DIAGNOSIS — I2510 Atherosclerotic heart disease of native coronary artery without angina pectoris: Secondary | ICD-10-CM | POA: Diagnosis not present

## 2022-12-02 DIAGNOSIS — R001 Bradycardia, unspecified: Secondary | ICD-10-CM | POA: Diagnosis not present

## 2022-12-02 DIAGNOSIS — I5022 Chronic systolic (congestive) heart failure: Secondary | ICD-10-CM

## 2022-12-02 DIAGNOSIS — I513 Intracardiac thrombosis, not elsewhere classified: Secondary | ICD-10-CM | POA: Diagnosis not present

## 2022-12-02 DIAGNOSIS — I10 Essential (primary) hypertension: Secondary | ICD-10-CM

## 2022-12-02 DIAGNOSIS — I724 Aneurysm of artery of lower extremity: Secondary | ICD-10-CM | POA: Diagnosis not present

## 2022-12-02 DIAGNOSIS — E785 Hyperlipidemia, unspecified: Secondary | ICD-10-CM | POA: Diagnosis not present

## 2022-12-02 DIAGNOSIS — I255 Ischemic cardiomyopathy: Secondary | ICD-10-CM

## 2022-12-02 NOTE — Patient Instructions (Addendum)
Medication Instructions:  Your physician recommends that you continue on your current medications as directed. Please refer to the Current Medication list given to you today.  *If you need a refill on your cardiac medications before your next appointment, please call your pharmacy*   Lab Work: NONE If you have labs (blood work) drawn today and your tests are completely normal, you will receive your results only by: MyChart Message (if you have MyChart) OR A paper copy in the mail If you have any lab test that is abnormal or we need to change your treatment, we will call you to review the results.   Testing/Procedures: NONE   Follow-Up: At [facility], you and your health needs are our priority.  As part of our continuing mission to provide you with exceptional heart care, we have created designated Provider Care Teams.  These Care Teams include your primary Cardiologist (physician) and Advanced Practice Providers (APPs -  Physician Assistants and Nurse Practitioners) who all work together to provide you with the care you need, when you need it.  We recommend signing up for the patient portal called "MyChart".  Sign up information is provided on this After Visit Summary.  MyChart is used to connect with patients for Virtual Visits (Telemedicine).  Patients are able to view lab/test results, encounter notes, upcoming appointments, etc.  Non-urgent messages can be sent to your provider as well.   To learn more about what you can do with MyChart, go to [email or web address].    Your next appointment:   Recall already in to see Dr. Jens Som.

## 2022-12-04 ENCOUNTER — Inpatient Hospital Stay: Payer: No Typology Code available for payment source

## 2022-12-04 DIAGNOSIS — C8510 Unspecified B-cell lymphoma, unspecified site: Secondary | ICD-10-CM

## 2022-12-04 DIAGNOSIS — C8333 Diffuse large B-cell lymphoma, intra-abdominal lymph nodes: Secondary | ICD-10-CM | POA: Diagnosis not present

## 2022-12-04 DIAGNOSIS — Z801 Family history of malignant neoplasm of trachea, bronchus and lung: Secondary | ICD-10-CM | POA: Diagnosis not present

## 2022-12-04 DIAGNOSIS — Z7901 Long term (current) use of anticoagulants: Secondary | ICD-10-CM | POA: Diagnosis not present

## 2022-12-04 DIAGNOSIS — Z800 Family history of malignant neoplasm of digestive organs: Secondary | ICD-10-CM | POA: Diagnosis not present

## 2022-12-04 DIAGNOSIS — R748 Abnormal levels of other serum enzymes: Secondary | ICD-10-CM

## 2022-12-04 DIAGNOSIS — I255 Ischemic cardiomyopathy: Secondary | ICD-10-CM | POA: Diagnosis not present

## 2022-12-04 DIAGNOSIS — Z87891 Personal history of nicotine dependence: Secondary | ICD-10-CM | POA: Diagnosis not present

## 2022-12-04 DIAGNOSIS — C8339 Diffuse large B-cell lymphoma, extranodal and solid organ sites: Secondary | ICD-10-CM | POA: Diagnosis not present

## 2022-12-04 LAB — HEPATIC FUNCTION PANEL
ALT: 24 U/L (ref 0–44)
AST: 31 U/L (ref 15–41)
Albumin: 3.6 g/dL (ref 3.5–5.0)
Alkaline Phosphatase: 61 U/L (ref 38–126)
Bilirubin, Direct: 0.1 mg/dL (ref 0.0–0.2)
Indirect Bilirubin: 0.8 mg/dL (ref 0.3–0.9)
Total Bilirubin: 0.9 mg/dL (ref 0.3–1.2)
Total Protein: 6.5 g/dL (ref 6.5–8.1)

## 2022-12-04 LAB — LACTATE DEHYDROGENASE: LDH: 182 U/L (ref 98–192)

## 2022-12-04 MED ORDER — HEPARIN SOD (PORK) LOCK FLUSH 100 UNIT/ML IV SOLN
500.0000 [IU] | Freq: Once | INTRAVENOUS | Status: AC
  Administered 2022-12-04: 500 [IU] via INTRAVENOUS

## 2022-12-04 MED ORDER — SODIUM CHLORIDE 0.9% FLUSH
10.0000 mL | INTRAVENOUS | Status: DC | PRN
  Administered 2022-12-04: 10 mL

## 2022-12-04 NOTE — Progress Notes (Signed)
[name] [name] presented for Portacath access and flush with labs. Portacath located right chest wall accessed with H20 needle.  Good blood return present. Portacath flushed with 20ml NS and 500U/45ml Heparin and needle removed intact.  Procedure tolerated well and without incident.     Discharged from clinic ambulatory in stable condition. Alert and oriented x 3. F/U with [facility] as scheduled.

## 2022-12-04 NOTE — Patient Instructions (Signed)
[facility]  Discharge Instructions: Thank you for choosing [facility] to provide your oncology and hematology care.  If you have a lab appointment with the Cancer Center - please note that after April 8th, 2024, all labs will be drawn in the cancer center.  You do not have to check in or register with the main entrance as you have in the past but will complete your check-in in the cancer center.  Wear comfortable clothing and clothing appropriate for easy access to any Portacath or PICC line.   We strive to give you quality time with your provider. You may need to reschedule your appointment if you arrive late (15 or more minutes).  Arriving late affects you and other patients whose appointments are after yours.  Also, if you miss three or more appointments without notifying the office, you may be dismissed from the clinic at the provider's discretion.      For prescription refill requests, have your pharmacy contact our office and allow 72 hours for refills to be completed.    Today you received port flush with labs.     BELOW ARE SYMPTOMS THAT SHOULD BE REPORTED IMMEDIATELY: *FEVER GREATER THAN 100.4 F (38 C) OR HIGHER *CHILLS OR SWEATING *NAUSEA AND VOMITING THAT IS NOT CONTROLLED WITH YOUR NAUSEA MEDICATION *UNUSUAL SHORTNESS OF BREATH *UNUSUAL BRUISING OR BLEEDING *URINARY PROBLEMS (pain or burning when urinating, or frequent urination) *BOWEL PROBLEMS (unusual diarrhea, constipation, pain near the anus) TENDERNESS IN MOUTH AND THROAT WITH OR WITHOUT PRESENCE OF ULCERS (sore throat, sores in mouth, or a toothache) UNUSUAL RASH, SWELLING OR PAIN  UNUSUAL VAGINAL DISCHARGE OR ITCHING   Items with * indicate a potential emergency and should be followed up as soon as possible or go to the Emergency Department if any problems should occur.  Please show the CHEMOTHERAPY ALERT CARD or IMMUNOTHERAPY ALERT CARD at check-in to the Emergency Department and  triage nurse.  Should you have questions after your visit or need to cancel or reschedule your appointment, please contact [facility] [phone]  and follow the prompts.  Office hours are 8:00 a.m. to 4:30 p.m. Monday - Friday. Please note that voicemails left after 4:00 p.m. may not be returned until the following business day.  We are closed weekends and major holidays. You have access to a nurse at all times for urgent questions. Please call the main number to the clinic [phone] and follow the prompts.  For any non-urgent questions, you may also contact your provider using MyChart. We now offer e-Visits for anyone 68 and older to request care online for non-urgent symptoms. For details visit [email or web address].   Also download the MyChart app! Go to the app store, search "MyChart", open the app, select [facility], and log in with your MyChart username and password.

## 2022-12-07 ENCOUNTER — Other Ambulatory Visit: Payer: Self-pay | Admitting: Cardiology

## 2022-12-08 NOTE — Progress Notes (Signed)
Remote ICD transmission.   

## 2022-12-21 ENCOUNTER — Ambulatory Visit: Payer: Medicare HMO | Attending: Cardiovascular Disease

## 2022-12-21 DIAGNOSIS — I5022 Chronic systolic (congestive) heart failure: Secondary | ICD-10-CM | POA: Diagnosis not present

## 2022-12-21 DIAGNOSIS — Z95810 Presence of automatic (implantable) cardiac defibrillator: Secondary | ICD-10-CM

## 2022-12-22 ENCOUNTER — Telehealth: Payer: Self-pay

## 2022-12-22 ENCOUNTER — Telehealth: Payer: Self-pay | Admitting: *Deleted

## 2022-12-22 ENCOUNTER — Ambulatory Visit: Payer: Medicare HMO | Attending: Cardiology | Admitting: *Deleted

## 2022-12-22 DIAGNOSIS — Z5181 Encounter for therapeutic drug level monitoring: Secondary | ICD-10-CM

## 2022-12-22 DIAGNOSIS — I513 Intracardiac thrombosis, not elsewhere classified: Secondary | ICD-10-CM

## 2022-12-22 LAB — POCT INR: INR: 2.8 (ref 2.0–3.0)

## 2022-12-22 NOTE — Patient Instructions (Signed)
Finished chemo Continue warfarin 1 tablet daily except 1/2 tablet on Tuesdays Continue greens Recheck in 4 wks Pending surgery on Rt leg 5/31  Pt has not been notified yet.  Have not received coumadin clearance request.  Pt will call office.

## 2022-12-22 NOTE — Telephone Encounter (Signed)
   Pre-operative Risk Assessment    Patient Name: [name] [name]  DOB: 1946-08-07 MRN: [id number]      Request for Surgical Clearance    Procedure:   INCISION & DRAINAGE OF RIGHT LEG  Date of Surgery:  Clearance 01/08/23                                 Surgeon:  DR. Sherald Hess Surgeon's Group or Practice Name:  [facility] Phone number:  [phone] Fax number:  [phone]   Type of Clearance Requested:   - Medical  - Pharmacy:  Hold Warfarin (Coumadin) x 5 DAYS PRIOR   Type of Anesthesia:  Not Indicated   Additional requests/questions:    [name]   12/22/2022, 4:53 PM

## 2022-12-22 NOTE — Telephone Encounter (Signed)
Pt called with questions about whether or not he should stop Coumadin prior to his surgery later this month. Per MD note, pt has been advised to hold his Coumadin x 5 days. Pt verbalized understanding of this.

## 2022-12-23 NOTE — Telephone Encounter (Signed)
   Primary Cardiologist: Olga Millers, MD  Chart reviewed as part of pre-operative protocol coverage. Given past medical history and time since last visit, based on ACC/AHA guidelines, TEGEN LANDAU would be at acceptable risk for the planned procedure without further cardiovascular testing.   Patient was advised that if he develops new symptoms prior to surgery to contact our office to arrange a follow-up appointment.  He verbalized understanding.  Per office protocol, patient can hold warfarin for 5 days prior to procedure.   Patient will NOT need bridging with Lovenox (enoxaparin) around procedure.  I will route this recommendation to the requesting party via Epic fax function and remove from pre-op pool.  Please call with questions.  Levi Aland, NP-C  12/23/2022, 11:49 AM [address] Office [phone] Fax [phone]

## 2022-12-23 NOTE — Telephone Encounter (Signed)
Hi Gavin Pound, you saw this patient on 12/02/22.  Could you please comment on cardiac risk for upcoming incision and drainage of right leg?  Please send your response to p cv div preop.  Thank you, Marcelino Duster

## 2022-12-23 NOTE — Telephone Encounter (Signed)
Patient with diagnosis of mural thrombus on warfarin for anticoagulation.    Procedure: INCISION & DRAINAGE OF RIGHT LEG  Date of procedure: 01/08/23    Patient has had an apical thrombus seen on MRI in 2020. Thrombus not seen on repeat echo. Previously cleared to hold warfarin without a bridge.  Per office protocol, patient can hold warfarin for 5 days prior to procedure.    Patient will NOT need bridging with Lovenox (enoxaparin) around procedure.  **This guidance is not considered finalized until pre-operative APP has relayed final recommendations.**

## 2022-12-25 NOTE — Progress Notes (Signed)
EPIC Encounter for ICM Monitoring  Patient Name: [name] [name] is a 77 y.o. male Date: 12/25/2022 Primary Care Physican: Corwin Levins, MD Primary Cardiologist: Jens Som Electrophysiologist: Mealor 04/20/2022 Not weighing at home 05/07/2022 Weight: 181 lbs (does not weigh consistently at home) 09/29/2022 Office Weight: 190 lbs 11/17/2022 Weight: 185 lbs                                                           Spoke with patient and heart failure questions reviewed.  Transmission results reviewed.  Pt asymptomatic for fluid accumulation.  Reports feeling well at this time and voices no complaints.      DIET:  Pt uses salt shaker at home and does not follow low salt diet.  Also eats restaurant foods.     Optivol thoracic impedance suggesting normal fluid levels since 4/9.    Prescribed: Furosemide 20 mg take 2 tablet(s) (40 mg total) by mouth daily   Spironolactone 25 mg take 1 tablet by mouth daily Jardiance 10 mg take 1 tablet by mouth daily   Labs: 11/23/2022 Creatinine 1.68, BUN 42, Potassium 3.9, Sodium 135, GFR 42 11/12/2022 Creatinine 1.21, BUN 28, Potassium 3.8, Sodium 137, GFR >60 09/25/2022 Creatinine 1.37, BUN 30, Potassium 3.8, Sodium 3.8, GFR 53 08/13/2022 Creatinine 1.04, BUN 21, Potassium 3.5, Sodium 138  A complete set of results can be found in Results Review.   Recommendations:  No changes and encouraged to call if experiencing any fluid symptoms.   Follow-up plan: ICM clinic phone appointment on 01/27/2023.   91 day device clinic remote transmission 01/26/2023.     EP/Cardiology Office Visits:   Recall 08/23/2023 with Dr Jens Som.  Recall 07/25/2023 with Dr Nelly Laurence.    Copy of ICM check sent to Dr. Nelly Laurence.    3 month ICM trend: 12/21/2022.    12-14 Month ICM trend:     Karie Soda, RN 12/25/2022 3:09 PM

## 2023-01-07 ENCOUNTER — Encounter: Payer: Self-pay | Admitting: Cardiovascular Disease

## 2023-01-07 ENCOUNTER — Encounter (HOSPITAL_COMMUNITY): Payer: Self-pay | Admitting: Vascular Surgery

## 2023-01-07 ENCOUNTER — Other Ambulatory Visit: Payer: Self-pay

## 2023-01-07 NOTE — Progress Notes (Signed)
PERIOPERATIVE PRESCRIPTION FOR IMPLANTED CARDIAC DEVICE PROGRAMMING  Patient Information: Name:  [name] [name]  DOB:  [birth year]  MRN:  [id number]    Planned Procedure:  Incision and drainage of right leg  Surgeon:  Dr. Sherald Hess  Date of Procedure:  01/07/23  Cautery will be used.  Position during surgery:  supine   Please send documentation back to:  [facility] (Fax # [phone])  Device Information:  Clinic EP Physician:  Dr. York Pellant   Device Type:  Defibrillator Manufacturer and Phone #:  Medtronic: [id number] Pacemaker Dependent?:  No. Date of Last Device Check:  12/21/22 Normal Device Function?:  Yes.    Electrophysiologist's Recommendations:  Have magnet available. Provide continuous ECG monitoring when magnet is used or reprogramming is to be performed.  Procedure should not interfere with device function.  No device programming or magnet placement needed.  Per Device Clinic Standing Orders, Lenor Coffin, RN  9:56 AM 01/07/2023

## 2023-01-07 NOTE — Progress Notes (Signed)
Spoke with pt for pre-op call. Pt has an ICD. Request for Device orders sent and received back. "No device programming or magnet placement needed." Pt is on Coumadin, he was instructed to hold it for 5 days, last dose was 01/02/23 per patient. Pt is diabetic. He was not instructed to hold his Jardiance from surgeon's office. Last dose was today, 01/07/23.  Shower instructions given to pt and he voiced understanding.

## 2023-01-08 ENCOUNTER — Encounter (HOSPITAL_COMMUNITY): Payer: No Typology Code available for payment source | Admitting: Certified Registered Nurse Anesthetist

## 2023-01-08 ENCOUNTER — Other Ambulatory Visit: Payer: Self-pay

## 2023-01-08 ENCOUNTER — Encounter (HOSPITAL_COMMUNITY)
Admission: RE | Disposition: A | Discharge status: Home / Self Care | Payer: Self-pay | Source: Home / Self Care | Attending: Vascular Surgery

## 2023-01-08 ENCOUNTER — Encounter (HOSPITAL_COMMUNITY): Payer: Self-pay | Admitting: Certified Registered Nurse Anesthetist

## 2023-01-08 ENCOUNTER — Encounter (HOSPITAL_COMMUNITY): Payer: Self-pay | Admitting: Vascular Surgery

## 2023-01-08 ENCOUNTER — Ambulatory Visit (HOSPITAL_COMMUNITY)
Admission: RE | Admit: 2023-01-08 | Discharge: 2023-01-08 | Disposition: A | Discharge status: Home / Self Care | Payer: No Typology Code available for payment source | Attending: Vascular Surgery | Admitting: Vascular Surgery

## 2023-01-08 DIAGNOSIS — I2510 Atherosclerotic heart disease of native coronary artery without angina pectoris: Secondary | ICD-10-CM | POA: Diagnosis not present

## 2023-01-08 DIAGNOSIS — Z951 Presence of aortocoronary bypass graft: Secondary | ICD-10-CM

## 2023-01-08 DIAGNOSIS — I252 Old myocardial infarction: Secondary | ICD-10-CM | POA: Diagnosis not present

## 2023-01-08 DIAGNOSIS — I97648 Postprocedural seroma of a circulatory system organ or structure following other circulatory system procedure: Secondary | ICD-10-CM | POA: Diagnosis present

## 2023-01-08 DIAGNOSIS — Z87891 Personal history of nicotine dependence: Secondary | ICD-10-CM | POA: Diagnosis not present

## 2023-01-08 DIAGNOSIS — I10 Essential (primary) hypertension: Secondary | ICD-10-CM | POA: Insufficient documentation

## 2023-01-08 DIAGNOSIS — Z7960 Long term (current) use of unspecified immunomodulators and immunosuppressants: Secondary | ICD-10-CM | POA: Insufficient documentation

## 2023-01-08 DIAGNOSIS — I9789 Other postprocedural complications and disorders of the circulatory system, not elsewhere classified: Secondary | ICD-10-CM

## 2023-01-08 DIAGNOSIS — E785 Hyperlipidemia, unspecified: Secondary | ICD-10-CM | POA: Diagnosis not present

## 2023-01-08 DIAGNOSIS — L7634 Postprocedural seroma of skin and subcutaneous tissue following other procedure: Secondary | ICD-10-CM | POA: Diagnosis not present

## 2023-01-08 DIAGNOSIS — C8590 Non-Hodgkin lymphoma, unspecified, unspecified site: Secondary | ICD-10-CM | POA: Diagnosis not present

## 2023-01-08 HISTORY — DX: Pneumonia, unspecified organism: J18.9

## 2023-01-08 HISTORY — PX: INCISION AND DRAINAGE: SHX5863

## 2023-01-08 LAB — BASIC METABOLIC PANEL
Anion gap: 12 (ref 5–15)
BUN: 31 mg/dL — ABNORMAL HIGH (ref 8–23)
CO2: 25 mmol/L (ref 22–32)
Calcium: 9.1 mg/dL (ref 8.9–10.3)
Chloride: 100 mmol/L (ref 98–111)
Creatinine, Ser: 1.37 mg/dL — ABNORMAL HIGH (ref 0.61–1.24)
GFR, Estimated: 53 mL/min — ABNORMAL LOW (ref >60)
Glucose, Bld: 105 mg/dL — ABNORMAL HIGH (ref 70–99)
Potassium: 3.8 mmol/L (ref 3.5–5.1)
Sodium: 137 mmol/L (ref 135–145)

## 2023-01-08 LAB — SURGICAL PCR SCREEN
MRSA, PCR: NEGATIVE
Staphylococcus aureus: NEGATIVE

## 2023-01-08 LAB — CBC
HCT: 43.6 % (ref 39.0–52.0)
Hemoglobin: 14.1 g/dL (ref 13.0–17.0)
MCH: 31.5 pg (ref 26.0–34.0)
MCHC: 32.3 g/dL (ref 30.0–36.0)
MCV: 97.3 fL (ref 80.0–100.0)
Platelets: 112 10*3/uL — ABNORMAL LOW (ref 150–400)
RBC: 4.48 MIL/uL (ref 4.22–5.81)
RDW: 13.8 % (ref 11.5–15.5)
WBC: 5.3 10*3/uL (ref 4.0–10.5)
nRBC: 0 % (ref 0.0–0.2)

## 2023-01-08 LAB — APTT: aPTT: 29 seconds (ref 24–36)

## 2023-01-08 LAB — GLUCOSE, CAPILLARY: Glucose-Capillary: 111 mg/dL — ABNORMAL HIGH (ref 70–99)

## 2023-01-08 LAB — PROTIME-INR
INR: 1.3 — ABNORMAL HIGH (ref 0.8–1.2)
Prothrombin Time: 16.1 seconds — ABNORMAL HIGH (ref 11.4–15.2)

## 2023-01-08 SURGERY — INCISION AND DRAINAGE
Anesthesia: General | Site: Leg Upper | Laterality: Right

## 2023-01-08 MED ORDER — FENTANYL CITRATE (PF) 100 MCG/2ML IJ SOLN
25.0000 ug | INTRAMUSCULAR | Status: DC | PRN
  Administered 2023-01-08: 25 ug via INTRAVENOUS

## 2023-01-08 MED ORDER — DEXAMETHASONE SODIUM PHOSPHATE 10 MG/ML IJ SOLN
INTRAMUSCULAR | Status: DC | PRN
  Administered 2023-01-08: 10 mg via INTRAVENOUS

## 2023-01-08 MED ORDER — FENTANYL CITRATE (PF) 250 MCG/5ML IJ SOLN
INTRAMUSCULAR | Status: AC
  Filled 2023-01-08: qty 5

## 2023-01-08 MED ORDER — SODIUM CHLORIDE 0.9 % IV SOLN: INTRAVENOUS | Status: DC

## 2023-01-08 MED ORDER — CEFAZOLIN SODIUM-DEXTROSE 2-4 GM/100ML-% IV SOLN
2.0000 g | INTRAVENOUS | Status: AC
  Administered 2023-01-08: 2 g via INTRAVENOUS
  Filled 2023-01-08: qty 100

## 2023-01-08 MED ORDER — FENTANYL CITRATE (PF) 250 MCG/5ML IJ SOLN
INTRAMUSCULAR | Status: DC | PRN
  Administered 2023-01-08: 25 ug via INTRAVENOUS
  Administered 2023-01-08: 50 ug via INTRAVENOUS

## 2023-01-08 MED ORDER — CHLORHEXIDINE GLUCONATE 4 % EX LIQD: 60.0000 mL | Freq: Once | CUTANEOUS | Status: DC

## 2023-01-08 MED ORDER — ACETAMINOPHEN 500 MG PO TABS
ORAL_TABLET | ORAL | Status: AC
  Filled 2023-01-08: qty 2

## 2023-01-08 MED ORDER — LACTATED RINGERS IV SOLN: INTRAVENOUS | Status: DC

## 2023-01-08 MED ORDER — PHENYLEPHRINE 80 MCG/ML (10ML) SYRINGE FOR IV PUSH (FOR BLOOD PRESSURE SUPPORT)
PREFILLED_SYRINGE | INTRAVENOUS | Status: DC | PRN
  Administered 2023-01-08: 80 ug via INTRAVENOUS
  Administered 2023-01-08: 160 ug via INTRAVENOUS
  Administered 2023-01-08: 80 ug via INTRAVENOUS
  Administered 2023-01-08: 160 ug via INTRAVENOUS
  Administered 2023-01-08: 80 ug via INTRAVENOUS
  Administered 2023-01-08 (×2): 160 ug via INTRAVENOUS

## 2023-01-08 MED ORDER — 0.9 % SODIUM CHLORIDE (POUR BTL) OPTIME
TOPICAL | Status: DC | PRN
  Administered 2023-01-08: 1000 mL

## 2023-01-08 MED ORDER — OXYCODONE HCL 5 MG PO TABS
5.0000 mg | ORAL_TABLET | Freq: Once | ORAL | Status: AC
  Administered 2023-01-08: 5 mg via ORAL

## 2023-01-08 MED ORDER — OXYCODONE HCL 5 MG PO TABS
ORAL_TABLET | ORAL | Status: AC
  Filled 2023-01-08: qty 1

## 2023-01-08 MED ORDER — OXYCODONE-ACETAMINOPHEN 5-325 MG PO TABS: 1.0000 | ORAL_TABLET | Freq: Four times a day (QID) | ORAL | 0 refills | Status: DC | PRN

## 2023-01-08 MED ORDER — FENTANYL CITRATE (PF) 100 MCG/2ML IJ SOLN
INTRAMUSCULAR | Status: AC
  Filled 2023-01-08: qty 2

## 2023-01-08 MED ORDER — ACETAMINOPHEN 500 MG PO TABS
1000.0000 mg | ORAL_TABLET | Freq: Once | ORAL | Status: AC
  Administered 2023-01-08: 1000 mg via ORAL

## 2023-01-08 MED ORDER — CHLORHEXIDINE GLUCONATE 0.12 % MT SOLN
15.0000 mL | Freq: Once | OROMUCOSAL | Status: AC
  Administered 2023-01-08: 15 mL via OROMUCOSAL
  Filled 2023-01-08: qty 15

## 2023-01-08 MED ORDER — LIDOCAINE 2% (20 MG/ML) 5 ML SYRINGE
INTRAMUSCULAR | Status: DC | PRN
  Administered 2023-01-08: 60 mg via INTRAVENOUS

## 2023-01-08 MED ORDER — PROPOFOL 10 MG/ML IV BOLUS
INTRAVENOUS | Status: DC | PRN
  Administered 2023-01-08: 160 mg via INTRAVENOUS

## 2023-01-08 MED ORDER — ORAL CARE MOUTH RINSE: 15.0000 mL | Freq: Once | OROMUCOSAL | Status: AC

## 2023-01-08 MED ORDER — ONDANSETRON HCL 4 MG/2ML IJ SOLN
INTRAMUSCULAR | Status: DC | PRN
  Administered 2023-01-08: 4 mg via INTRAVENOUS

## 2023-01-08 MED ORDER — EPHEDRINE SULFATE-NACL 50-0.9 MG/10ML-% IV SOSY
PREFILLED_SYRINGE | INTRAVENOUS | Status: DC | PRN
  Administered 2023-01-08 (×2): 2.5 mg via INTRAVENOUS

## 2023-01-08 SURGICAL SUPPLY — 42 items
ADH SKN CLS APL DERMABOND .7 (GAUZE/BANDAGES/DRESSINGS) ×1
BAG COUNTER SPONGE SURGICOUNT (BAG) ×1 IMPLANT
BAG SPNG CNTER NS LX DISP (BAG) ×1
BIOPATCH RED 1 DISK 7.0 (GAUZE/BANDAGES/DRESSINGS) IMPLANT
BNDG ELASTIC 4X5.8 VLCR STR LF (GAUZE/BANDAGES/DRESSINGS) IMPLANT
BNDG ELASTIC 6X5.8 VLCR STR LF (GAUZE/BANDAGES/DRESSINGS) IMPLANT
BNDG GAUZE DERMACEA FLUFF 4 (GAUZE/BANDAGES/DRESSINGS) IMPLANT
BNDG GZE DERMACEA 4 6PLY (GAUZE/BANDAGES/DRESSINGS)
CANISTER SUCT 3000ML PPV (MISCELLANEOUS) ×1 IMPLANT
COVER SURGICAL LIGHT HANDLE (MISCELLANEOUS) ×2 IMPLANT
DERMABOND ADVANCED .7 DNX12 (GAUZE/BANDAGES/DRESSINGS) ×1 IMPLANT
DRAIN CHANNEL 19F RND (DRAIN) IMPLANT
DRAPE ORTHO SPLIT 77X108 STRL (DRAPES) ×2
DRAPE SURG ORHT 6 SPLT 77X108 (DRAPES) IMPLANT
DRSG OPSITE POSTOP 3X4 (GAUZE/BANDAGES/DRESSINGS) IMPLANT
ELECT REM PT RETURN 9FT ADLT (ELECTROSURGICAL) ×1
ELECTRODE REM PT RTRN 9FT ADLT (ELECTROSURGICAL) ×1 IMPLANT
EVACUATOR SILICONE 100CC (DRAIN) IMPLANT
GAUZE SPONGE 4X4 12PLY STRL (GAUZE/BANDAGES/DRESSINGS) ×1 IMPLANT
GLOVE BIO SURGEON STRL SZ7.5 (GLOVE) ×1 IMPLANT
GLOVE BIOGEL PI IND STRL 8 (GLOVE) ×1 IMPLANT
GOWN STRL REUS W/ TWL LRG LVL3 (GOWN DISPOSABLE) ×2 IMPLANT
GOWN STRL REUS W/ TWL XL LVL3 (GOWN DISPOSABLE) ×2 IMPLANT
GOWN STRL REUS W/TWL LRG LVL3 (GOWN DISPOSABLE) ×2
GOWN STRL REUS W/TWL XL LVL3 (GOWN DISPOSABLE) ×2
KIT BASIN OR (CUSTOM PROCEDURE TRAY) ×1 IMPLANT
KIT TURNOVER KIT B (KITS) ×1 IMPLANT
NS IRRIG 1000ML POUR BTL (IV SOLUTION) ×1 IMPLANT
PACK CV ACCESS (CUSTOM PROCEDURE TRAY) ×1 IMPLANT
PACK GENERAL/GYN (CUSTOM PROCEDURE TRAY) IMPLANT
PAD ARMBOARD 7.5X6 YLW CONV (MISCELLANEOUS) ×2 IMPLANT
POWDER MYRIAD MORCELLS 1000MG (Miscellaneous) IMPLANT
SUT ETHILON 2 0 PSLX (SUTURE) IMPLANT
SUT ETHILON 3 0 PS 1 (SUTURE) IMPLANT
SUT MNCRL AB 4-0 PS2 18 (SUTURE) IMPLANT
SUT VIC AB 2-0 CT1 27 (SUTURE) ×2
SUT VIC AB 2-0 CT1 TAPERPNT 27 (SUTURE) IMPLANT
SUT VIC AB 3-0 SH 27 (SUTURE)
SUT VIC AB 3-0 SH 27X BRD (SUTURE) IMPLANT
TAPE CLOTH 4X10 WHT NS (GAUZE/BANDAGES/DRESSINGS) IMPLANT
TOWEL GREEN STERILE (TOWEL DISPOSABLE) ×1 IMPLANT
WATER STERILE IRR 1000ML POUR (IV SOLUTION) ×1 IMPLANT

## 2023-01-08 NOTE — Anesthesia Preprocedure Evaluation (Addendum)
Anesthesia Evaluation  Patient identified by MRN, date of birth, ID band Patient awake    Reviewed: Allergy & Precautions, NPO status , Patient's Chart, lab work & pertinent test results, reviewed documented beta blocker date and time   Airway Mallampati: III  TM Distance: >3 FB Neck ROM: Full    Dental  (+) Missing, Chipped,    Pulmonary former smoker   Pulmonary exam normal breath sounds clear to auscultation       Cardiovascular hypertension, Pt. on home beta blockers and Pt. on medications + CAD, + Past MI, + CABG and + Peripheral Vascular Disease  Normal cardiovascular exam+ Cardiac Defibrillator  Rhythm:Regular Rate:Normal  TTE 2023  1. Left ventricular ejection fraction, by estimation, is 30 to 35%. The  left ventricle has moderately decreased function. The left ventricle  demonstrates regional wall motion abnormalities (see scoring  diagram/findings for description). Left ventricular   diastolic parameters are indeterminate. There is akinesis of the left  ventricular, mid-apical anteroseptal wall, inferoseptal wall and apical  segment.   2. Right ventricular systolic function is normal. The right ventricular  size is normal.   3. Left atrial size was moderately dilated.   4. The mitral valve is grossly normal. Trivial mitral valve  regurgitation. No evidence of mitral stenosis.   5. The aortic valve is tricuspid. There is moderate calcification of the  aortic valve. Aortic valve regurgitation is mild. Mild aortic valve  stenosis.   6. The inferior vena cava is normal in size with <50% respiratory  variability, suggesting right atrial pressure of 8 mmHg.     Neuro/Psych negative neurological ROS  negative psych ROS   GI/Hepatic Neg liver ROS, hiatal hernia,,,  Endo/Other  Hypothyroidism    Renal/GU negative Renal ROS  negative genitourinary   Musculoskeletal  (+) Arthritis ,    Abdominal   Peds   Hematology  (+) Blood dyscrasia (coumadin, last dose 01/02/23)   Anesthesia Other Findings   Reproductive/Obstetrics                             Anesthesia Physical Anesthesia Plan  ASA: 4  Anesthesia Plan: General   Post-op Pain Management: Tylenol PO (pre-op)*   Induction: Intravenous  PONV Risk Score and Plan: 2 and Ondansetron, Dexamethasone and Treatment may vary due to age or medical condition  Airway Management Planned: LMA  Additional Equipment:   Intra-op Plan:   Post-operative Plan: Extubation in OR  Informed Consent: I have reviewed the patients History and Physical, chart, labs and discussed the procedure including the risks, benefits and alternatives for the proposed anesthesia with the patient or authorized representative who has indicated his/her understanding and acceptance.     Dental advisory given  Plan Discussed with: CRNA  Anesthesia Plan Comments:         Anesthesia Quick Evaluation

## 2023-01-08 NOTE — Transfer of Care (Signed)
Immediate Anesthesia Transfer of Care Note  Patient: [name] [name]  Procedure(s) Performed: INCISION AND DRAINAGE OF RIGHT LEG  SEROMA, DRAIN PLACEMENT AND MYRIAD MORCELLS (Right: Leg Upper)  Patient Location: PACU  Anesthesia Type:General  Level of Consciousness: awake, alert , and oriented  Airway & Oxygen Therapy: Patient Spontanous Breathing  Post-op Assessment: Report given to RN and Post -op Vital signs reviewed and stable  Post vital signs: Reviewed and stable  Last Vitals: SEE MAR Vitals Value Taken Time  BP 133/82 01/08/23 1025  Temp    Pulse    Resp 15 01/08/23 1027  SpO2 100   Vitals shown include unvalidated device data.  Last Pain:  Vitals:   01/08/23 8295  TempSrc: Oral  PainSc: 0-No pain      Patients Stated Pain Goal: 0 (01/08/23 6213)  Complications: No notable events documented.

## 2023-01-08 NOTE — H&P (Signed)
Patient name: [name] [name]        MRN: [id number]        DOB: 1946-04-03          Sex: male   REASON FOR CONSULT: Follow-up after CTA   HPI: [name] [name] is a 77 y.o. male, with history of lymphoma undergoing chemotherapy, hypertension, hyperlipidemia, coronary artery disease status post CABG that presents for follow-up after CTA for evaluation of right leg fluid collection.  He was initially seen by Dr. Arbie Cookey in [place].  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  He was undergoing chemotherapy for lymphoma and Dr. Arbie Cookey recommended he follow-up with me in 1 to 2 months.  I ultimately took him for right above-knee popliteal artery to TP trunk/PT bypass with saphenous vein with ligation of the anterior tibial on 05/10/2022.     He was complaining about a bunch of swelling around the distal thigh particularly when walking.  I sent him for CT scan.  He presents for follow-up today.  Still having a lot of swelling here when he walks.       Past Medical History:  Diagnosis Date   AICD (automatic cardioverter/defibrillator) present     ALLERGIC RHINITIS 10/31/2007    Qualifier: Diagnosis of  By: Jonny Ruiz MD, Len Blalock    Allergy     Cancer (HCC) 12/03/2021    non-Hodgkin's B-cell lymphoma   CHOLECYSTECTOMY, HX OF 06/04/2007    Qualifier: Diagnosis of  By: Genelle Gather CMA, [place]     Chronic HFrEF (heart failure with reduced ejection fraction) (HCC)     COLONIC POLYPS, HX OF 10/31/2007    Qualifier: Diagnosis of  By: Jonny Ruiz MD, Len Blalock    CORONARY ARTERY BYPASS GRAFT, HX OF 06/04/2007    Qualifier: Diagnosis of  By: Genelle Gather CMA, [place]     CORONARY ARTERY DISEASE 06/04/2007    Qualifier: Diagnosis of  By: Genelle Gather CMA, [place]     Diverticulosis     Erectile dysfunction     Eye twitch      right eye since chilhood    History of hiatal hernia     HYPERLIPIDEMIA 06/04/2007    Qualifier: Diagnosis of  By: Genelle Gather  CMA, [place]     Hypersomnolence     HYPERTENSION 06/04/2007    Qualifier: Diagnosis of  By: Genelle Gather CMA, [place]     Hypothyroidism     ISCHEMIC CARDIOMYOPATHY 06/04/2007    Qualifier: Diagnosis of  By: Genelle Gather CMA, [place]     Ischemic cardiomyopathy     LV (left ventricular) mural thrombus 02/15/2019   Myocardial infarction [facility])      per patient , his occurred in 1998    Osteoarthritis     Peripheral vascular disease (HCC)     PLMD (periodic limb movement disorder)     Sleep apnea      no cpap' per patient , "i was checked for it and they said i didnt have it "    Vertigo     Vitamin D deficiency             Past Surgical History:  Procedure Laterality Date   BIOPSY   11/25/2021    Procedure: BIOPSY;  Surgeon: Lynann Bologna, MD;  Location: [facility];  Service: Gastroenterology;;   CABG   1999    [facility] [place], [place].  Patient  reports that a small vein was taken from under his arm and a small incesion was made.   CHOLECYSTECTOMY       COLONOSCOPY       COLONOSCOPY WITH PROPOFOL N/A 11/25/2021    Procedure: COLONOSCOPY WITH PROPOFOL;  Surgeon: Lynann Bologna, MD;  Location: [facility];  Service: Gastroenterology;  Laterality: N/A;   ENDOVASCULAR REPAIR OF POPLITEAL ARTERY ANEURYSM Right 05/20/2022    Procedure: LIGATION OF POPLITEAL ARTERY ANEURYSM;  Surgeon: Cephus Shelling, MD;  Location: [facility] OR;  Service: Vascular;  Laterality: Right;   ESOPHAGOGASTRODUODENOSCOPY (EGD) WITH PROPOFOL N/A 11/25/2021    Procedure: ESOPHAGOGASTRODUODENOSCOPY (EGD) WITH PROPOFOL;  Surgeon: Lynann Bologna, MD;  Location: [facility];  Service: Gastroenterology;  Laterality: N/A;   FEMORAL-POPLITEAL BYPASS GRAFT Right 05/20/2022    Procedure: RIGHT ABOVE KNEE POPLITEAL ARTERY TO TIBIOPERONEAL TRUNK/POSTERIOR TIBIAL ARTERY BYPASS;  Surgeon: Cephus Shelling, MD;  Location: [facility] OR;  Service: Vascular;  Laterality: Right;   ICD IMPLANT N/A 01/30/2020     Procedure: ICD IMPLANT;  Surgeon: Hillis Range, MD;  Location: [facility];  Service: Cardiovascular;  Laterality: N/A;   IR IMAGING GUIDED PORT INSERTION   12/04/2021   KNEE ARTHROPLASTY Left 08/05/2017    Procedure: LEFT TOTAL KNEE ARTHROPLASTY WITH COMPUTER NAVIGATION;  Surgeon: Samson Frederic, MD;  Location: [facility];  Service: Orthopedics;  Laterality: Left;  Needs RNFA   LIGATION OF CILIAC ARTERY Right 05/20/2022    Procedure: LIGATION OF RIGHT ANTERIOR TIBIAL ARTERY;  Surgeon: Cephus Shelling, MD;  Location: [facility] OR;  Service: Vascular;  Laterality: Right;   LOWER EXTREMITY ANGIOGRAPHY Right 05/14/2022    Procedure: Lower Extremity Angiography;  Surgeon: Cephus Shelling, MD;  Location: [facility] INVASIVE CV LAB;  Service: Cardiovascular;  Laterality: Right;   PENILE PROSTHESIS IMPLANT       POLYPECTOMY   11/25/2021    Procedure: POLYPECTOMY;  Surgeon: Lynann Bologna, MD;  Location: [facility];  Service: Gastroenterology;;   REPLACEMENT TOTAL KNEE   06/12/2013   spinal cyst       THORACOTOMY        left anterior; wound exploration and debridement   TONSILLECTOMY AND ADENOIDECTOMY        age 14   VEIN HARVEST Right 05/20/2022    Procedure: RIGHT GREATER SAPHENOUS VEIN HARVEST;  Surgeon: Cephus Shelling, MD;  Location: [facility] OR;  Service: Vascular;  Laterality: Right;           Family History  Problem Relation Age of Onset   Stomach cancer Mother          smokes   Lung cancer Father          chewed tobacco   Heart disease Brother          first MI at 65yo, now 16 for transplant list/ ICM   Pancreatic cancer Brother     Colon cancer Maternal Grandfather     COPD Son          was a smoker   Pancreatic cancer Maternal Uncle     Pancreatic cancer Cousin          mat side x 4   Esophageal cancer Neg Hx     Prostate cancer Neg Hx     Rectal cancer Neg Hx        SOCIAL HISTORY: Social History         Socioeconomic History   Marital status: Married      Spouse  name:  Betty   Number of children: 3   Years of education: Not on file   Highest education level: Not on file  Occupational History   Occupation: truck Naval architect: [employer]  Tobacco Use   Smoking status: Former      Packs/day: 4.00      Years: 17.00      Additional pack years: 0.00      Total pack years: 68.00      Types: Cigarettes      Quit date: 05/07/1981      Years since quitting: 41.5      Passive exposure: Never   Smokeless tobacco: Never   Tobacco comments:      Pt states that he would let most of them "burn" pt states that he used anywhere between 4-5PPD  Vaping Use   Vaping Use: Never used  Substance and Sexual Activity   Alcohol use: Yes      Alcohol/week: 1.0 standard drink of alcohol      Types: 1 Cans of beer per week   Drug use: No   Sexual activity: Not on file  Other Topics Concern   Not on file  Social History Narrative    Lives in [place] with spouse    Retired Naval architect    Social Determinants of Health        Financial Resource Strain: Low Risk  (10/27/2021)    Overall Financial Resource Strain (CARDIA)     Difficulty of Paying Living Expenses: Not hard at all  Food Insecurity: No Food Insecurity (05/22/2022)    Hunger Vital Sign     Worried About Running Out of Food in the Last Year: Never true     Ran Out of Food in the Last Year: Never true  Transportation Needs: No Transportation Needs (05/22/2022)    PRAPARE - Therapist, art (Medical): No     Lack of Transportation (Non-Medical): No  Physical Activity: Inactive (10/27/2021)    Exercise Vital Sign     Days of Exercise per Week: 0 days     Minutes of Exercise per Session: 0 min  Stress: No Stress Concern Present (10/27/2021)    [employer] of Occupational Health - Occupational Stress Questionnaire     Feeling of Stress : Not at all  Social Connections: Moderately Isolated (10/27/2021)    Social Connection and Isolation Panel [NHANES]      Frequency of Communication with Friends and Family: More than three times a week     Frequency of Social Gatherings with Friends and Family: More than three times a week     Attends Religious Services: Never     Database administrator or Organizations: No     Attends Banker Meetings: Never     Marital Status: Married  Catering manager Violence: Not At Risk (05/22/2022)    Humiliation, Afraid, Rape, and Kick questionnaire     Fear of Current or Ex-Partner: No     Emotionally Abused: No     Physically Abused: No     Sexually Abused: No           Allergies  Allergen Reactions   Ace Inhibitors Cough   Statins Other (See Comments)      Muscle pains            Current Outpatient Medications  Medication Sig Dispense Refill   ARTIFICIAL TEAR SOLUTION OP Place 1 drop into  both eyes daily as needed (dry eyes).       Ascorbic Acid (VITA-C PO) Take 1,000 mg by mouth daily.       aspirin EC 81 MG tablet Take 1 tablet (81 mg total) by mouth daily at 6 (six) AM. Swallow whole. 30 tablet 12   b complex vitamins capsule Take 1 capsule by mouth daily.       ezetimibe (ZETIA) 10 MG tablet TAKE 1 TABLET EVERY DAY (Patient taking differently: Take 10 mg by mouth daily.) 90 tablet 3   furosemide (LASIX) 20 MG tablet Take 2 tablets (40 mg total) by mouth daily. 180 tablet 3   losartan (COZAAR) 25 MG tablet Take 1 tablet (25 mg total) by mouth daily. 90 tablet 3   meloxicam (MOBIC) 15 MG tablet Take 15 mg by mouth daily.       metoprolol succinate (TOPROL-XL) 50 MG 24 hr tablet Take 1 tablet (50 mg total) by mouth daily. 90 tablet 1   Multiple Vitamins-Minerals (MULTIVITAMIN WITH MINERALS) tablet Take 1 tablet by mouth daily. Centrum silver       rosuvastatin (CRESTOR) 5 MG tablet TAKE 1 TABLET EVERY OTHER DAY 45 tablet 3   triamcinolone (NASACORT) 55 MCG/ACT AERO nasal inhaler Place 2 sprays into the nose daily. 3 Inhaler 3   warfarin (COUMADIN) 5 MG tablet Take 1 tablet daily except 1  1/2 tablets on Mondays or as directed. 90 tablet 5   feeding supplement (ENSURE ENLIVE / ENSURE PLUS) LIQD Take 237 mLs by mouth 3 (three) times daily between meals. (Patient not taking: Reported on 10/27/2022) 237 mL 12    No current facility-administered medications for this visit.      REVIEW OF SYSTEMS:  [X]  denotes positive finding, [ ]  denotes negative finding Cardiac   Comments:  Chest pain or chest pressure:      Shortness of breath upon exertion:      Short of breath when lying flat:      Irregular heart rhythm:             Vascular      Pain in calf, thigh, or hip brought on by ambulation:      Pain in feet at night that wakes you up from your sleep:       Blood clot in your veins:      Leg swelling:  x Right         Pulmonary      Oxygen at home:      Productive cough:       Wheezing:              Neurologic      Sudden weakness in arms or legs:       Sudden numbness in arms or legs:       Sudden onset of difficulty speaking or slurred speech:      Temporary loss of vision in one eye:       Problems with dizziness:              Gastrointestinal      Blood in stool:       Vomited blood:              Genitourinary      Burning when urinating:       Blood in urine:             Psychiatric      Major depression:  Hematologic      Bleeding problems:      Problems with blood clotting too easily:             Skin      Rashes or ulcers:             Constitutional      Fever or chills:          PHYSICAL EXAM:    Vitals:    10/27/22 1057  BP: 109/71  Pulse: 60  Resp: 18  Temp: 97.9 F (36.6 C)  TempSrc: Temporal  SpO2: 95%  Weight: 194 lb (88 kg)  Height: 5\' 11"  (1.803 m)      GENERAL: The patient is a well-nourished male, in no acute distress. The vital signs are documented above. CARDIAC: There is a regular rate and rhythm.  VASCULAR:  Right femoral pulse palpable Right PT brisk by doppler PULMONARY: No respiratory  distress ABDOMEN: Soft and non-tender. MUSCULOSKELETAL: There are no major deformities or cyanosis. NEUROLOGIC: No focal weakness or paresthesias are detected. SKIN: There are no ulcers or rashes noted. PSYCHIATRIC: The patient has a normal affect.   DATA:    CTA reviewed with patent right lower extremity bypass for exclusion of popliteal aneurysm.  No evidence of contrast filling in the aneurysm sac.  Popliteal aneurysm has decreased from 4.7 to 4 cm   Right leg arterial duplex previously showed biphasic and triphasic waveforms in the bypass with better velocities.   ABIs on the right are 1.27 triphasic   Assessment/Plan:   77 year old male presents for follow-up after CTA for evaluation of fluid collection in right leg.  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  Discussed right leg bypass patent on CT.  There is no filling of the aneurysm with contrast showing appropriate exclusion of the popliteal aneurysm.  The right popliteal aneurysm has decreased from 4.7 to 4 cm since surgery.  He does have a fairly sizable seroma in the distal thigh where the saphenous vein was harvested.  This is very bothersome to him and he is having trouble walking due to swelling.  I have subsequently recommended I&D with likely drain placement.       Cephus Shelling, MD [facility] [place] Office: [phone]

## 2023-01-08 NOTE — Discharge Instructions (Signed)
Restart coumadin Saturday 01/09/23.  Take ACE wrap off tomorrow Saturday 01/09/23.

## 2023-01-08 NOTE — Anesthesia Postprocedure Evaluation (Signed)
Anesthesia Post Note  Patient: [name] [name]  Procedure(s) Performed: INCISION AND DRAINAGE OF RIGHT LEG  SEROMA, DRAIN PLACEMENT AND MYRIAD MORCELLS (Right: Leg Upper)     Patient location during evaluation: PACU Anesthesia Type: General Level of consciousness: awake and alert Pain management: pain level controlled Vital Signs Assessment: post-procedure vital signs reviewed and stable Respiratory status: spontaneous breathing, nonlabored ventilation, respiratory function stable and patient connected to nasal cannula oxygen Cardiovascular status: blood pressure returned to baseline and stable Postop Assessment: no apparent nausea or vomiting Anesthetic complications: no  No notable events documented.  Last Vitals:  Vitals:   01/08/23 1045 01/08/23 1100  BP: 119/66 114/72  Pulse: 64 69  Resp: (!) 22 16  Temp:  36.5 C  SpO2: 96% 96%    Last Pain:  Vitals:   01/08/23 1030  TempSrc:   PainSc: 0-No pain                 Deonna Krummel L Jorah Hua

## 2023-01-08 NOTE — Op Note (Signed)
Date: Jan 08, 2023  Preoperative diagnosis: Seroma at right leg saphenectomy site after remote bypass for right popliteal artery aneurysm  Postoperative diagnosis: Same  Procedure: 1.  I&D of right leg seroma 2.  Placement of 1000 mg myriad morcels skin substitute in the right leg wound 3.  19 French round Blake drain placement right leg seroma cavity  Surgeon: Dr. Cephus Shelling, MD  Assistant: OR staff  Indications: 77 year old male that underwent right lower extremity bypass on 05/10/2022 with saphenous vein for a 4.7 cm popliteal aneurysm.  He developed a seroma at the saphenectomy site of his distal thigh and presents for I&D as he states this is very bothersome when he is walking and has not resolved with conservative therapy.  Findings: I reopened the saphenectomy incision and encountered a large fluid cavity consistent with a seroma that was drained.  I resected the majority of the seroma cavity capsule  with Bovie cautery.  19 French round Blake drain was placed in the cavity as well and tunneled through the thigh as well as 1000 mg of myriad morcels and the incision was closed.  Anesthesia: LMA  Details: Patient was taken to the operating room after informed consent was obtained.  Placed on operative table supine position.  After anesthesia was induced the right leg was prepped and draped in standard sterile fashion.  Timeout was performed and antibiotics were given.  I reopened the saphenectomy incision on the medial distal right thigh and encountered a large fluid cavity consistent with a seroma that was drained.  There was no evidence of purulence or other infection.  I used Bovie cautery and resected the majority of the seroma capsule.  I then tunneled a 19 [place] round Blake drain out of the thigh after it was placed in the wound and this was secured with a 3-0 silk suture.  Approximately 1000 mg of myriad morcels was placed in the wound bed.  This was then closed with 3-0  Vicryl 4-0 Monocryl and Dermabond.  A gentle ace wrap was applied.   Complication: None  Condition: Stable  Cephus Shelling, MD [facility] [place] Office: [phone]   Cephus Shelling

## 2023-01-08 NOTE — Anesthesia Procedure Notes (Signed)
Procedure Name: LMA Insertion Date/Time: 01/08/2023 9:27 AM  Performed by: [name], CRNAPre-anesthesia Checklist: Patient identified, Emergency Drugs available, Suction available and Patient being monitored Patient Re-evaluated:Patient Re-evaluated prior to induction Oxygen Delivery Method: Circle System Utilized Preoxygenation: Pre-oxygenation with 100% oxygen Induction Type: IV induction Ventilation: Mask ventilation without difficulty LMA: LMA inserted LMA Size: 4.0 Number of attempts: 1 Airway Equipment and Method: Bite block Placement Confirmation: positive ETCO2 Tube secured with: Tape Dental Injury: Teeth and Oropharynx as per pre-operative assessment

## 2023-01-08 NOTE — Progress Notes (Signed)
Dr. Chestine Spore made aware of today's PT/INR result. Ok per Dr. Chestine Spore.

## 2023-01-09 ENCOUNTER — Encounter (HOSPITAL_COMMUNITY): Payer: Self-pay | Admitting: Vascular Surgery

## 2023-01-09 DIAGNOSIS — M1990 Unspecified osteoarthritis, unspecified site: Secondary | ICD-10-CM | POA: Diagnosis not present

## 2023-01-09 DIAGNOSIS — I739 Peripheral vascular disease, unspecified: Secondary | ICD-10-CM | POA: Diagnosis not present

## 2023-01-09 DIAGNOSIS — I5022 Chronic systolic (congestive) heart failure: Secondary | ICD-10-CM | POA: Diagnosis not present

## 2023-01-09 DIAGNOSIS — I252 Old myocardial infarction: Secondary | ICD-10-CM | POA: Diagnosis not present

## 2023-01-09 DIAGNOSIS — E785 Hyperlipidemia, unspecified: Secondary | ICD-10-CM | POA: Diagnosis not present

## 2023-01-09 DIAGNOSIS — Z48812 Encounter for surgical aftercare following surgery on the circulatory system: Secondary | ICD-10-CM | POA: Diagnosis not present

## 2023-01-09 DIAGNOSIS — I110 Hypertensive heart disease with heart failure: Secondary | ICD-10-CM | POA: Diagnosis not present

## 2023-01-09 DIAGNOSIS — C8590 Non-Hodgkin lymphoma, unspecified, unspecified site: Secondary | ICD-10-CM | POA: Diagnosis not present

## 2023-01-09 DIAGNOSIS — I2510 Atherosclerotic heart disease of native coronary artery without angina pectoris: Secondary | ICD-10-CM | POA: Diagnosis not present

## 2023-01-12 ENCOUNTER — Ambulatory Visit (INDEPENDENT_AMBULATORY_CARE_PROVIDER_SITE_OTHER): Payer: No Typology Code available for payment source | Admitting: Physician Assistant

## 2023-01-12 ENCOUNTER — Encounter: Payer: Self-pay | Admitting: Physician Assistant

## 2023-01-12 VITALS — BP 101/61 | HR 69 | Temp 97.6°F | Wt 192.0 lb

## 2023-01-12 DIAGNOSIS — I739 Peripheral vascular disease, unspecified: Secondary | ICD-10-CM

## 2023-01-12 DIAGNOSIS — L7634 Postprocedural seroma of skin and subcutaneous tissue following other procedure: Secondary | ICD-10-CM

## 2023-01-12 DIAGNOSIS — I724 Aneurysm of artery of lower extremity: Secondary | ICD-10-CM

## 2023-01-12 NOTE — Progress Notes (Signed)
POST OPERATIVE OFFICE NOTE    CC:  F/u for surgery  HPI:  [name] [name] is a 77 y.o. male who is here for a drain check. He has a history of right above knee popliteal artery to TP trunk/PT bypass with saphenous vein and ligation of the AT artery by Dr.Clark on 05/10/2022.  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  Postoperatively he developed a large seroma of the distal thigh incision.  He underwent drainage and excision of the seroma with JP drain placement on 01/08/2023 by Dr. Chestine Spore. There were no signs of infection during surgery.  Pt returns today for follow up.  He has felt much better since surgery.  Both him and his wife have been taking care of his JP drain and recording its output.  A lot of the swelling in his right distal thigh has gone down.  He no longer has any pain at the seroma site.  He denies any puslike drainage or erythema of his thigh.  He endorses serosanguineous drainage into the drain bulb.  Allergies  Allergen Reactions   Ace Inhibitors Cough   Statins Other (See Comments)    Muscle pains    Current Outpatient Medications  Medication Sig Dispense Refill   Ascorbic Acid (VITA-C PO) Take 1,000 mg by mouth daily.     aspirin EC 81 MG tablet Take 1 tablet (81 mg total) by mouth daily at 6 (six) AM. Swallow whole. 30 tablet 12   b complex vitamins capsule Take 1 capsule by mouth daily.     empagliflozin (JARDIANCE) 10 MG TABS tablet Take 5 mg by mouth daily.     ezetimibe (ZETIA) 10 MG tablet TAKE 1 TABLET EVERY DAY 90 tablet 2   feeding supplement (ENSURE ENLIVE / ENSURE PLUS) LIQD Take 237 mLs by mouth 3 (three) times daily between meals. 237 mL 12   furosemide (LASIX) 20 MG tablet Take 2 tablets (40 mg total) by mouth daily. 180 tablet 3   losartan (COZAAR) 25 MG tablet Take 0.5 tablets (12.5 mg total) by mouth daily. 30 tablet 0   meloxicam (MOBIC) 15 MG tablet Take 15 mg by mouth daily.      metoprolol succinate (TOPROL-XL) 50 MG 24 hr tablet Take 1 tablet (50 mg total) by mouth daily. (Patient taking differently: Take 25 mg by mouth daily. 0.5 tablet daily.) 90 tablet 1   Multiple Vitamins-Minerals (MULTIVITAMIN WITH MINERALS) tablet Take 1 tablet by mouth daily. Centrum silver     oxyCODONE-acetaminophen (PERCOCET) 5-325 MG tablet Take 1 tablet by mouth every 6 (six) hours as needed for severe pain. 16 tablet 0   rosuvastatin (CRESTOR) 5 MG tablet TAKE 1 TABLET EVERY OTHER DAY 45 tablet 3   warfarin (COUMADIN) 5 MG tablet Take 1 tablet daily except 1 1/2 tablets on Mondays or as directed. (Patient taking differently: Take 2.5-5 mg by mouth See admin instructions. Take 5 mg  tablet daily EXCEPT: Tuesday take 2.5 mg  in the morning or as directed.) 90 tablet 5   No current facility-administered medications for this visit.     ROS:  See HPI  Physical Exam:  Incision:  Right distal thigh incision healing well without signs of infection. Mild swelling of proximal portion of incision Extremities:  drain exit site patent without signs of infection. Right thigh drain with 15cc output currently, serosanguineous  Neuro: intact motor and sensation of RLE    Assessment/Plan:  This is a 77 y.o. male who is s/p: Drainage and excision of right distal thigh seroma and drain placement on 01/08/2023  -The patient has been taking good care of his incision and drain.  He has kept a record of his drain output.  For the past 2 days he has had drain output around 130ccs.This morning before his appointment he had 30cc output so far -His right distal thigh incision is intact and dry.  There is some mild swelling of the proximal portion, however this is greatly improved since surgery -There is no erythema surrounding the incision or drain exit site.  His pain in the right thigh has gone away since surgery -I have discussed this case with Dr. Chestine Spore.  Given that he still has significant drain output we  will keep his drain in for a little while longer.  He will follow-up with our office this coming Monday for drain recheck and possible removal   [name] Dubonnet, PA-C [facility] [phone]   Clinic MD:  [name] [name]

## 2023-01-13 ENCOUNTER — Telehealth: Payer: Self-pay

## 2023-01-13 NOTE — Telephone Encounter (Signed)
Archie Patten, RN with [facility] called to inform this office that the pt refused [facility] services.

## 2023-01-17 NOTE — Progress Notes (Unsigned)
POST OPERATIVE OFFICE NOTE    CC:  F/u for surgery  HPI:  This is a 77 y.o. male who is s/p  Right above knee popliteal artery to TP trunk/PT bypass with saphenous vein and ligation of the AT artery by Dr.Clark on 05/10/2022.  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  He subsequently underwent drainage and excision of right distal thigh seroma and drain placement on 01/08/2023 by Dr. Chestine Spore.  He was seen on 01/12/2023 and was feeling better since surgery.  A lot of the pre operative swelling had improved.  He was continuing to have increased SS drainage in the drain with increased amounts and for this reason, it was left in place.  He did not have any evidence of infection.   He was scheduled to return today for evaluation.  Pt returns today for follow up and here with his wife.  Pt states the drain has been draining quite a bit.  He does have some swelling in the right leg that is better in the mornings.  He states he had some swelling prior to surgery.  He does have some neuropathy in the right great toe.  He does not have claudication or rest pain or non healing wounds.    Allergies  Allergen Reactions   Ace Inhibitors Cough   Statins Other (See Comments)    Muscle pains    Current Outpatient Medications  Medication Sig Dispense Refill   Ascorbic Acid (VITA-C PO) Take 1,000 mg by mouth daily.     aspirin EC 81 MG tablet Take 1 tablet (81 mg total) by mouth daily at 6 (six) AM. Swallow whole. 30 tablet 12   b complex vitamins capsule Take 1 capsule by mouth daily.     empagliflozin (JARDIANCE) 10 MG TABS tablet Take 5 mg by mouth daily.     ezetimibe (ZETIA) 10 MG tablet TAKE 1 TABLET EVERY DAY 90 tablet 2   feeding supplement (ENSURE ENLIVE / ENSURE PLUS) LIQD Take 237 mLs by mouth 3 (three) times daily between meals. 237 mL 12   furosemide (LASIX) 20 MG tablet Take 2 tablets (40 mg total) by mouth daily. 180 tablet 3    losartan (COZAAR) 25 MG tablet Take 0.5 tablets (12.5 mg total) by mouth daily. 30 tablet 0   meloxicam (MOBIC) 15 MG tablet Take 15 mg by mouth daily.     metoprolol succinate (TOPROL-XL) 50 MG 24 hr tablet Take 1 tablet (50 mg total) by mouth daily. (Patient taking differently: Take 25 mg by mouth daily. 0.5 tablet daily.) 90 tablet 1   Multiple Vitamins-Minerals (MULTIVITAMIN WITH MINERALS) tablet Take 1 tablet by mouth daily. Centrum silver     oxyCODONE-acetaminophen (PERCOCET) 5-325 MG tablet Take 1 tablet by mouth every 6 (six) hours as needed for severe pain. 16 tablet 0   rosuvastatin (CRESTOR) 5 MG tablet TAKE 1 TABLET EVERY OTHER DAY 45 tablet 3   warfarin (COUMADIN) 5 MG tablet Take 1 tablet daily except 1 1/2 tablets on Mondays or as directed. (Patient taking differently: Take 2.5-5 mg by mouth See admin instructions. Take 5 mg  tablet daily EXCEPT: Tuesday take 2.5 mg  in the morning or as directed.) 90 tablet 5   No current facility-administered medications for this visit.     ROS:  See HPI  Physical Exam:  Today's Vitals   01/18/23 1249  BP: 130/75  Pulse: 64  Temp:  98 F (36.7 C)  TempSrc: Temporal  SpO2: 98%   There is no height or weight on file to calculate BMI.   Incision:  right thigh incision is clean and dry Extremities:  brisk multiphasic DP/PT doppler signal; some RLE swelling.  JP drain in place.     Assessment/Plan:  This is a 77 y.o. male who is s/p: Right above knee popliteal artery to TP trunk/PT bypass with saphenous vein and ligation of the AT artery by Dr.Clark on 05/10/2022.  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  He subsequently underwent drainage and excision of right distal thigh seroma and drain placement on 01/08/2023 by Dr. Chestine Spore..  -JP drain consistently draining ~ 150cc/day.  The drainage is serosanguinous. Discussed with Dr. Chestine Spore and the pt-he will come back to  the office on Friday and the drain will be removed regardless of amount of drainage given that will be two weeks.  Discussed with pt that there is a chance it will re-accumulate.  His leg was re-wrapped with 4" ace at the foot and 6" ace wrap for leg swelling.   -he has brisk doppler flow right foot.  -discussed that his PCP can try gabapentin for the neuropathy in his right great toe if they feel it is appropriate.   -he is already scheduled for a 6 month follow up for arterial studies.  -continue asa/statin   Doreatha Massed, [facility] [phone]   Clinic MD:  Myra Gianotti

## 2023-01-18 ENCOUNTER — Encounter: Payer: Self-pay | Admitting: Physician Assistant

## 2023-01-18 ENCOUNTER — Ambulatory Visit (INDEPENDENT_AMBULATORY_CARE_PROVIDER_SITE_OTHER): Payer: No Typology Code available for payment source | Admitting: Physician Assistant

## 2023-01-18 VITALS — BP 130/75 | HR 64 | Temp 98.0°F

## 2023-01-18 DIAGNOSIS — I724 Aneurysm of artery of lower extremity: Secondary | ICD-10-CM

## 2023-01-18 DIAGNOSIS — L7634 Postprocedural seroma of skin and subcutaneous tissue following other procedure: Secondary | ICD-10-CM

## 2023-01-19 ENCOUNTER — Ambulatory Visit: Payer: No Typology Code available for payment source | Attending: Cardiology | Admitting: *Deleted

## 2023-01-19 DIAGNOSIS — Z5181 Encounter for therapeutic drug level monitoring: Secondary | ICD-10-CM | POA: Diagnosis not present

## 2023-01-19 DIAGNOSIS — I513 Intracardiac thrombosis, not elsewhere classified: Secondary | ICD-10-CM | POA: Diagnosis not present

## 2023-01-19 LAB — POCT INR: INR: 1.7 — AB (ref 2.0–3.0)

## 2023-01-19 NOTE — Patient Instructions (Signed)
Finished chemo Take warfarin 1 tablet tonight then resume 1 tablet daily except 1/2 tablet on Tuesdays Continue greens Recheck in 3 wks

## 2023-01-22 ENCOUNTER — Ambulatory Visit (INDEPENDENT_AMBULATORY_CARE_PROVIDER_SITE_OTHER): Payer: No Typology Code available for payment source | Admitting: Physician Assistant

## 2023-01-22 ENCOUNTER — Encounter: Payer: Self-pay | Admitting: Physician Assistant

## 2023-01-22 VITALS — BP 113/67 | HR 61 | Temp 97.8°F | Resp 16 | Ht 71.0 in | Wt 182.0 lb

## 2023-01-22 DIAGNOSIS — I724 Aneurysm of artery of lower extremity: Secondary | ICD-10-CM

## 2023-01-22 NOTE — Progress Notes (Signed)
POST OPERATIVE OFFICE NOTE    CC:  F/u for surgery  HPI:  This is a 77 y.o. male who is s/p  Right above knee popliteal artery to TP trunk/PT bypass with saphenous vein and ligation of the AT artery by Dr.Clark on 05/10/2022.  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  He subsequently underwent drainage and excision of right distal thigh seroma and drain placement on 01/08/2023 by Dr. Chestine Spore.    JP drain consistently draining ~ 150cc/day. The drainage is serosanguinous. Discussed with Dr. Chestine Spore and the pt-he will come back to the office on Friday and the drain will be removed regardless of amount of drainage given that will be two weeks.   He states it has not put out much over the past few days.  He denies fever and chills.   Allergies  Allergen Reactions   Ace Inhibitors Cough   Statins Other (See Comments)    Muscle pains    Current Outpatient Medications  Medication Sig Dispense Refill   Ascorbic Acid (VITA-C PO) Take 1,000 mg by mouth daily.     aspirin EC 81 MG tablet Take 1 tablet (81 mg total) by mouth daily at 6 (six) AM. Swallow whole. 30 tablet 12   b complex vitamins capsule Take 1 capsule by mouth daily.     empagliflozin (JARDIANCE) 10 MG TABS tablet Take 5 mg by mouth daily.     ezetimibe (ZETIA) 10 MG tablet TAKE 1 TABLET EVERY DAY 90 tablet 2   feeding supplement (ENSURE ENLIVE / ENSURE PLUS) LIQD Take 237 mLs by mouth 3 (three) times daily between meals. 237 mL 12   furosemide (LASIX) 20 MG tablet Take 2 tablets (40 mg total) by mouth daily. 180 tablet 3   losartan (COZAAR) 25 MG tablet Take 0.5 tablets (12.5 mg total) by mouth daily. 30 tablet 0   meloxicam (MOBIC) 15 MG tablet Take 15 mg by mouth daily.     metoprolol succinate (TOPROL-XL) 50 MG 24 hr tablet Take 1 tablet (50 mg total) by mouth daily. (Patient taking differently: Take 25 mg by mouth daily. 0.5 tablet daily.) 90 tablet 1   Multiple  Vitamins-Minerals (MULTIVITAMIN WITH MINERALS) tablet Take 1 tablet by mouth daily. Centrum silver     oxyCODONE-acetaminophen (PERCOCET) 5-325 MG tablet Take 1 tablet by mouth every 6 (six) hours as needed for severe pain. 16 tablet 0   rosuvastatin (CRESTOR) 5 MG tablet TAKE 1 TABLET EVERY OTHER DAY 45 tablet 3   warfarin (COUMADIN) 5 MG tablet Take 1 tablet daily except 1 1/2 tablets on Mondays or as directed. (Patient taking differently: Take 2.5-5 mg by mouth See admin instructions. Take 5 mg  tablet daily EXCEPT: Tuesday take 2.5 mg  in the morning or as directed.) 90 tablet 5   No current facility-administered medications for this visit.     ROS:  See HPI  Physical Exam:  Right AK JP drain discontinued.  Patient tolerated this well brisk multiphasic DP/PT doppler signal    Assessment/Plan:  This is a 77 y.o. male who is s/p:Right above knee popliteal artery to TP trunk/PT bypass with saphenous vein and ligation of the AT artery by Dr.Clark on 05/10/2022.  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  He subsequently underwent drainage and excision of right distal thigh seroma and drain placement on 01/08/2023  by Dr. Chestine Spore..   Tolerated JP drain removal He will f/u in 6 months for arterial studies.  He will continue asa/statin     Mosetta Pigeon PA-C [facility] [phone]   Clinic MD: Karin Lieu

## 2023-01-26 ENCOUNTER — Ambulatory Visit (INDEPENDENT_AMBULATORY_CARE_PROVIDER_SITE_OTHER): Payer: Medicare HMO

## 2023-01-26 DIAGNOSIS — I255 Ischemic cardiomyopathy: Secondary | ICD-10-CM

## 2023-01-26 DIAGNOSIS — I5022 Chronic systolic (congestive) heart failure: Secondary | ICD-10-CM

## 2023-01-27 ENCOUNTER — Other Ambulatory Visit: Payer: Self-pay

## 2023-01-27 ENCOUNTER — Ambulatory Visit: Payer: Medicare HMO | Attending: Cardiovascular Disease

## 2023-01-27 DIAGNOSIS — Z95810 Presence of automatic (implantable) cardiac defibrillator: Secondary | ICD-10-CM

## 2023-01-27 DIAGNOSIS — I739 Peripheral vascular disease, unspecified: Secondary | ICD-10-CM

## 2023-01-27 DIAGNOSIS — I5022 Chronic systolic (congestive) heart failure: Secondary | ICD-10-CM

## 2023-01-29 ENCOUNTER — Encounter: Payer: Self-pay | Admitting: Hematology

## 2023-01-29 LAB — CUP PACEART REMOTE DEVICE CHECK
Battery Remaining Longevity: 91 mo
Battery Voltage: 3 V
Brady Statistic AP VP Percent: 0.02 %
Brady Statistic AP VS Percent: 34.72 %
Brady Statistic AS VP Percent: 0.02 %
Brady Statistic AS VS Percent: 65.24 %
Brady Statistic RA Percent Paced: 33.82 %
Brady Statistic RV Percent Paced: 0.04 %
Date Time Interrogation Session: 20240620213624
HighPow Impedance: 67 Ohm
Implantable Lead Connection Status: 753985
Implantable Lead Connection Status: 753985
Implantable Lead Implant Date: 20210622
Implantable Lead Implant Date: 20210622
Implantable Lead Location: 753859
Implantable Lead Location: 753860
Implantable Lead Model: 5076
Implantable Pulse Generator Implant Date: 20210622
Lead Channel Impedance Value: 304 Ohm
Lead Channel Impedance Value: 361 Ohm
Lead Channel Impedance Value: 361 Ohm
Lead Channel Pacing Threshold Amplitude: 0.5 V
Lead Channel Pacing Threshold Amplitude: 0.625 V
Lead Channel Pacing Threshold Pulse Width: 0.4 ms
Lead Channel Pacing Threshold Pulse Width: 0.4 ms
Lead Channel Sensing Intrinsic Amplitude: 4.25 mV
Lead Channel Sensing Intrinsic Amplitude: 4.25 mV
Lead Channel Sensing Intrinsic Amplitude: 6.875 mV
Lead Channel Sensing Intrinsic Amplitude: 6.875 mV
Lead Channel Setting Pacing Amplitude: 1.5 V
Lead Channel Setting Pacing Amplitude: 2.5 V
Lead Channel Setting Pacing Pulse Width: 0.4 ms
Lead Channel Setting Sensing Sensitivity: 0.3 mV
Zone Setting Status: 755011

## 2023-01-29 NOTE — Progress Notes (Signed)
EPIC Encounter for ICM Monitoring  Patient Name: [name] [name] is a 77 y.o. male Date: 01/29/2023 Primary Care Physican: Corwin Levins, MD Primary Cardiologist: Jens Som Electrophysiologist: Mealor 04/20/2022 Not weighing at home 05/07/2022 Weight: 181 lbs (does not weigh consistently at home) 09/29/2022 Office Weight: 190 lbs 11/17/2022 Weight: 185 lbs  Monitored VT (171-200 bpm)  2 monitored VT episodes, longest was 35 sec. SVT: VT/VF Rx Withheld  1                                                           Spoke with patient and heart failure questions reviewed.  Transmission results reviewed.  Pt reports right leg swelling.  He was on vacation this week and returned yesterday.      DIET:  Pt uses salt shaker at home and does not follow low salt diet.  Also eats restaurant foods.     Optivol thoracic impedance suggesting possible fluid accumulation starting 6/13 and trending back toward baseline.   Message sent to device clinic triage regarding monitored VT episode.     Prescribed: Furosemide 20 mg take 2 tablet(s) (40 mg total) by mouth daily   Spironolactone 25 mg take 1 tablet by mouth daily Jardiance 10 mg take 1 tablet by mouth daily   Labs: 01/08/2023 Creatinine 1.37, BUN 31, Potassium 3.8, Sodium 137, GFR 53 11/23/2022 Creatinine 1.68, BUN 42, Potassium 3.9, Sodium 135, GFR 42 11/12/2022 Creatinine 1.21, BUN 28, Potassium 3.8, Sodium 137, GFR >60 09/25/2022 Creatinine 1.37, BUN 30, Potassium 3.8, Sodium 3.8, GFR 53 08/13/2022 Creatinine 1.04, BUN 21, Potassium 3.5, Sodium 138  A complete set of results can be found in Results Review.   Recommendations:  Recommendation to limit salt intake to 2000 mg daily and fluid intake to 64 oz daily.  Encouraged to call if experiencing any fluid symptoms.    Follow-up plan: ICM clinic phone appointment on 03/01/2023.   91 day device clinic remote transmission 04/27/2023.     EP/Cardiology Office Visits:   Recall 08/23/2023 with Dr  Jens Som.  Recall 07/25/2023 with Dr Nelly Laurence.    Copy of ICM check sent to Dr. Nelly Laurence.    3 month ICM trend: 01/28/2023.    12-14 Month ICM trend:     Karie Soda, RN 01/29/2023 2:54 PM

## 2023-02-01 ENCOUNTER — Encounter: Payer: Self-pay | Admitting: Hematology

## 2023-02-04 ENCOUNTER — Telehealth: Payer: Self-pay

## 2023-02-04 ENCOUNTER — Encounter: Payer: Self-pay | Admitting: Hematology

## 2023-02-04 NOTE — Telephone Encounter (Signed)
-----   Message from Maurice Small, MD sent at 02/02/2023 12:45 PM EDT ----- Remote device interrogation reviewed. Multiple episode of extremely rapid rates -- concern for episodes of sustained VT. Will request urgent outpatient follow-up.  Lead parameters and battery within normal limits.  Normal device function.

## 2023-02-04 NOTE — Telephone Encounter (Signed)
Contacted patient, appointment scheduled for 02/05/23 at 11:45am - sent email to industry to make MDT rep aware also.

## 2023-02-05 ENCOUNTER — Encounter: Payer: Self-pay | Admitting: Cardiovascular Disease

## 2023-02-05 ENCOUNTER — Ambulatory Visit: Payer: Medicare HMO | Attending: Cardiovascular Disease | Admitting: Cardiovascular Disease

## 2023-02-05 VITALS — BP 128/68 | HR 85 | Ht 71.0 in | Wt 196.2 lb

## 2023-02-05 DIAGNOSIS — R001 Bradycardia, unspecified: Secondary | ICD-10-CM

## 2023-02-05 DIAGNOSIS — I255 Ischemic cardiomyopathy: Secondary | ICD-10-CM

## 2023-02-05 MED ORDER — METOPROLOL SUCCINATE ER 25 MG PO TB24: 25.0000 mg | ORAL_TABLET | Freq: Every day | ORAL | 3 refills | Status: DC

## 2023-02-05 NOTE — Patient Instructions (Signed)
Medication Instructions:  START Metoprolol Succinate 25 mg daily  *If you need a refill on your cardiac medications before your next appointment, please call your pharmacy*   Follow-Up: At [facility], you and your health needs are our priority.  As part of our continuing mission to provide you with exceptional heart care, we have created designated Provider Care Teams.  These Care Teams include your primary Cardiologist (physician) and Advanced Practice Providers (APPs -  Physician Assistants and Nurse Practitioners) who all work together to provide you with the care you need, when you need it.  We recommend signing up for the patient portal called "MyChart".  Sign up information is provided on this After Visit Summary.  MyChart is used to connect with patients for Virtual Visits (Telemedicine).  Patients are able to view lab/test results, encounter notes, upcoming appointments, etc.  Non-urgent messages can be sent to your provider as well.   To learn more about what you can do with MyChart, go to [email or web address].    Your next appointment:   1 year(s)  Provider:   York Pellant, MD

## 2023-02-05 NOTE — Progress Notes (Signed)
  Electrophysiology Office Note:    Date:  02/05/2023   ID:  Sheel, Panther 10/22/1945, MRN [id number]  PCP:  Corwin Levins, MD   [facility] Providers Cardiologist:  Olga Millers, MD Electrophysiologist:  Maurice Small, MD     Referring MD: Corwin Levins, MD   History of Present Illness:    [name] [name] is a 77 y.o. male with a medical history significant for ischemic cardiomyopathy, CHF with reduced EF, apical thrombus who presents today as an urgent visit after an episode of sustained VT.     He has heart history dates back to 1998 when he presented with an acute myocardial infarction.  He underwent minimally invasive LIMA to LAD.  Cardiac MRI in July 2020 showed ejection fraction of 30% with apical thrombus. Medtronic dual-chamber ICD was placed by Dr. Johney Frame on January 30, 2020.   He notes that he had discontinued his metoprolol while under treatment for cancer.  Patiently, he had dizziness while taking 50 mg dose.      He is seen for an urgent visit today after a device detected VT episode.  2 episodes occurred on June 20 totalling about 1 minute.  He was not aware of the episode and did not have any symptoms such as lightheadedness, dizziness, palpitations, syncope.  He did remark that he was returning from vacation to [facility] at the time of the event, and he did develop an acute enteritis later that evening.  Concurrently, he had a slight elevation in his OptiVol.  he has no device related complaints -- no new tenderness, drainage, redness.  EKGs/Labs/Other Studies Reviewed Today:    Echocardiogram:  TTE 01/12/2022 EF 30-35%   Monitors:   Stress testing:   Advanced imaging:   Cardiac catherization   EKG:   EKG Interpretation Date/Time:  Friday February 05 2023 12:00:47 EDT Ventricular Rate:  85 PR Interval:  182 QRS Duration:  110 QT Interval:  404 QTC Calculation: 480 R Axis:   -38  Text Interpretation: Sinus rhythm with  occasional Premature ventricular complexes and Premature atrial complexes Left axis deviation Anterolateral infarct , age undetermined When compared with ECG of 23-Nov-2022 12:44, PVCs now present Confirmed by York Pellant [phone]) on 02/05/2023 12:30:48 PM     Physical Exam:    VS:  BP 128/68   Pulse 85   Ht 5\' 11"  (1.803 m)   Wt 196 lb 3.2 oz (89 kg)   SpO2 96%   BMI 27.36 kg/m     Wt Readings from Last 3 Encounters:  02/05/23 196 lb 3.2 oz (89 kg)  01/22/23 182 lb (82.6 kg)  01/12/23 192 lb (87.1 kg)     GEN:  Well nourished, well developed in no acute distress CARDIAC: RRR, no murmurs, rubs, gallops RESPIRATORY:  Normal work of breathing MUSCULOSKELETAL: no edema    ASSESSMENT & PLAN:    CHFrEF Appears euvolemic and compensated today Continue losartan 25 (did not tolerate entresto), metoprolol XL, empagliflozin 10  VT  Asymptomatic, device detected with a VF zone of 200 Will reprogram therapies to a detection rate of 220 Restart metoprolol XL 25 (he was dizzy with 50)  History of LV thrombus On warfarin  Coronary disease Continue rosuvastatin 5, aspirin 81  Signed, Maurice Small, MD  02/05/2023 12:35 PM    [facility]

## 2023-02-15 ENCOUNTER — Ambulatory Visit: Payer: Medicare HMO | Attending: Cardiology | Admitting: *Deleted

## 2023-02-15 DIAGNOSIS — I513 Intracardiac thrombosis, not elsewhere classified: Secondary | ICD-10-CM | POA: Diagnosis not present

## 2023-02-15 DIAGNOSIS — Z5181 Encounter for therapeutic drug level monitoring: Secondary | ICD-10-CM | POA: Diagnosis not present

## 2023-02-15 LAB — POCT INR: INR: 2.5 (ref 2.0–3.0)

## 2023-02-15 NOTE — Patient Instructions (Signed)
Finished chemo Continue 1 tablet daily except 1/2 tablet on Tuesdays Continue greens Recheck in 4 wks

## 2023-02-16 ENCOUNTER — Other Ambulatory Visit: Payer: Self-pay

## 2023-02-16 DIAGNOSIS — C8510 Unspecified B-cell lymphoma, unspecified site: Secondary | ICD-10-CM

## 2023-02-17 ENCOUNTER — Encounter: Payer: Self-pay | Admitting: Hematology

## 2023-02-17 NOTE — Progress Notes (Signed)
Remote ICD transmission.   

## 2023-02-18 ENCOUNTER — Inpatient Hospital Stay: Payer: No Typology Code available for payment source | Attending: Hematology

## 2023-02-18 ENCOUNTER — Telehealth: Payer: Self-pay | Admitting: Hematology

## 2023-02-18 ENCOUNTER — Inpatient Hospital Stay: Payer: No Typology Code available for payment source

## 2023-02-18 ENCOUNTER — Ambulatory Visit (HOSPITAL_COMMUNITY): Payer: Medicare HMO

## 2023-02-18 DIAGNOSIS — R944 Abnormal results of kidney function studies: Secondary | ICD-10-CM | POA: Diagnosis not present

## 2023-02-18 DIAGNOSIS — D696 Thrombocytopenia, unspecified: Secondary | ICD-10-CM | POA: Insufficient documentation

## 2023-02-18 DIAGNOSIS — Z801 Family history of malignant neoplasm of trachea, bronchus and lung: Secondary | ICD-10-CM | POA: Diagnosis not present

## 2023-02-18 DIAGNOSIS — Z800 Family history of malignant neoplasm of digestive organs: Secondary | ICD-10-CM | POA: Diagnosis not present

## 2023-02-18 DIAGNOSIS — C8339 Diffuse large B-cell lymphoma, extranodal and solid organ sites: Secondary | ICD-10-CM | POA: Insufficient documentation

## 2023-02-18 DIAGNOSIS — Z87891 Personal history of nicotine dependence: Secondary | ICD-10-CM | POA: Insufficient documentation

## 2023-02-18 DIAGNOSIS — C8510 Unspecified B-cell lymphoma, unspecified site: Secondary | ICD-10-CM

## 2023-02-18 LAB — COMPREHENSIVE METABOLIC PANEL
ALT: 18 U/L (ref 0–44)
AST: 19 U/L (ref 15–41)
Albumin: 4 g/dL (ref 3.5–5.0)
Alkaline Phosphatase: 62 U/L (ref 38–126)
Anion gap: 7 (ref 5–15)
BUN: 30 mg/dL — ABNORMAL HIGH (ref 8–23)
CO2: 27 mmol/L (ref 22–32)
Calcium: 8.7 mg/dL — ABNORMAL LOW (ref 8.9–10.3)
Chloride: 101 mmol/L (ref 98–111)
Creatinine, Ser: 1.41 mg/dL — ABNORMAL HIGH (ref 0.61–1.24)
GFR, Estimated: 51 mL/min — ABNORMAL LOW (ref >60)
Glucose, Bld: 148 mg/dL — ABNORMAL HIGH (ref 70–99)
Potassium: 4.4 mmol/L (ref 3.5–5.1)
Sodium: 135 mmol/L (ref 135–145)
Total Bilirubin: 0.9 mg/dL (ref 0.3–1.2)
Total Protein: 6.9 g/dL (ref 6.5–8.1)

## 2023-02-18 LAB — CBC WITH DIFFERENTIAL/PLATELET
Abs Immature Granulocytes: 0.02 10*3/uL (ref 0.00–0.07)
Basophils Absolute: 0.1 10*3/uL (ref 0.0–0.1)
Basophils Relative: 1 %
Eosinophils Absolute: 0.2 10*3/uL (ref 0.0–0.5)
Eosinophils Relative: 4 %
HCT: 42.9 % (ref 39.0–52.0)
Hemoglobin: 14 g/dL (ref 13.0–17.0)
Immature Granulocytes: 0 %
Lymphocytes Relative: 30 %
Lymphs Abs: 1.6 10*3/uL (ref 0.7–4.0)
MCH: 31.5 pg (ref 26.0–34.0)
MCHC: 32.6 g/dL (ref 30.0–36.0)
MCV: 96.6 fL (ref 80.0–100.0)
Monocytes Absolute: 0.4 10*3/uL (ref 0.1–1.0)
Monocytes Relative: 8 %
Neutro Abs: 3 10*3/uL (ref 1.7–7.7)
Neutrophils Relative %: 57 %
Platelets: 132 10*3/uL — ABNORMAL LOW (ref 150–400)
RBC: 4.44 MIL/uL (ref 4.22–5.81)
RDW: 13.7 % (ref 11.5–15.5)
WBC: 5.4 10*3/uL (ref 4.0–10.5)
nRBC: 0 % (ref 0.0–0.2)

## 2023-02-18 LAB — MAGNESIUM: Magnesium: 2.3 mg/dL (ref 1.7–2.4)

## 2023-02-18 MED ORDER — HEPARIN SOD (PORK) LOCK FLUSH 100 UNIT/ML IV SOLN
500.0000 [IU] | Freq: Once | INTRAVENOUS | Status: AC
  Administered 2023-02-18: 500 [IU] via INTRAVENOUS

## 2023-02-18 MED ORDER — SODIUM CHLORIDE 0.9% FLUSH
10.0000 mL | Freq: Once | INTRAVENOUS | Status: AC
  Administered 2023-02-18: 10 mL via INTRAVENOUS

## 2023-02-18 NOTE — Patient Instructions (Signed)
[facility]  Discharge Instructions: Thank you for choosing [facility] to provide your oncology and hematology care.  If you have a lab appointment with the Cancer Center - please note that after April 8th, 2024, all labs will be drawn in the cancer center.  You do not have to check in or register with the main entrance as you have in the past but will complete your check-in in the cancer center.  Wear comfortable clothing and clothing appropriate for easy access to any Portacath or PICC line.   We strive to give you quality time with your provider. You may need to reschedule your appointment if you arrive late (15 or more minutes).  Arriving late affects you and other patients whose appointments are after yours.  Also, if you miss three or more appointments without notifying the office, you may be dismissed from the clinic at the provider's discretion.      For prescription refill requests, have your pharmacy contact our office and allow 72 hours for refills to be completed.  To help prevent nausea and vomiting after your treatment, we encourage you to take your nausea medication as directed.  BELOW ARE SYMPTOMS THAT SHOULD BE REPORTED IMMEDIATELY: *FEVER GREATER THAN 100.4 F (38 C) OR HIGHER *CHILLS OR SWEATING *NAUSEA AND VOMITING THAT IS NOT CONTROLLED WITH YOUR NAUSEA MEDICATION *UNUSUAL SHORTNESS OF BREATH *UNUSUAL BRUISING OR BLEEDING *URINARY PROBLEMS (pain or burning when urinating, or frequent urination) *BOWEL PROBLEMS (unusual diarrhea, constipation, pain near the anus) TENDERNESS IN MOUTH AND THROAT WITH OR WITHOUT PRESENCE OF ULCERS (sore throat, sores in mouth, or a toothache) UNUSUAL RASH, SWELLING OR PAIN  UNUSUAL VAGINAL DISCHARGE OR ITCHING   Items with * indicate a potential emergency and should be followed up as soon as possible or go to the Emergency Department if any problems should occur.  Please show the CHEMOTHERAPY ALERT CARD or  IMMUNOTHERAPY ALERT CARD at check-in to the Emergency Department and triage nurse.  Should you have questions after your visit or need to cancel or reschedule your appointment, please contact [facility] [phone]  and follow the prompts.  Office hours are 8:00 a.m. to 4:30 p.m. Monday - Friday. Please note that voicemails left after 4:00 p.m. may not be returned until the following business day.  We are closed weekends and major holidays. You have access to a nurse at all times for urgent questions. Please call the main number to the clinic [phone] and follow the prompts.  For any non-urgent questions, you may also contact your provider using MyChart. We now offer e-Visits for anyone 18 and older to request care online for non-urgent symptoms. For details visit [email or web address].   Also download the MyChart app! Go to the app store, search "MyChart", open the app, select [facility], and log in with your MyChart username and password.   

## 2023-02-18 NOTE — Progress Notes (Signed)
Patients port flushed without difficulty.  Good blood return noted with no bruising or swelling noted at site.  Band aid applied.  VSS with discharge and left in satisfactory condition with no s/s of distress noted.   

## 2023-02-18 NOTE — Telephone Encounter (Signed)
[place] [employer]   [id number] 02/17/23-08/17/23 [facility]AP

## 2023-02-19 ENCOUNTER — Ambulatory Visit (HOSPITAL_COMMUNITY)
Admission: RE | Admit: 2023-02-19 | Discharge: 2023-02-19 | Disposition: A | Discharge status: Home / Self Care | Payer: No Typology Code available for payment source | Source: Ambulatory Visit | Attending: Hematology | Admitting: Hematology

## 2023-02-19 DIAGNOSIS — C8510 Unspecified B-cell lymphoma, unspecified site: Secondary | ICD-10-CM | POA: Diagnosis not present

## 2023-02-19 MED ORDER — IOHEXOL 300 MG/ML  SOLN
75.0000 mL | Freq: Once | INTRAMUSCULAR | Status: AC | PRN
  Administered 2023-02-19: 75 mL via INTRAVENOUS

## 2023-02-19 MED ORDER — IOHEXOL 350 MG/ML SOLN: 75.0000 mL | Freq: Once | INTRAVENOUS | Status: DC | PRN

## 2023-02-23 ENCOUNTER — Inpatient Hospital Stay: Payer: No Typology Code available for payment source | Admitting: Hematology

## 2023-02-24 NOTE — Progress Notes (Signed)
[facility] [address], [place] [address]    Clinic Day:  02/25/2023  Referring physician: Corwin Levins, MD  Patient Care Team: Corwin Levins, MD as PCP - General Jens Som Madolyn Frieze, MD as PCP - Cardiology (Cardiology) Mealor, Roberts Gaudy, MD as PCP - Electrophysiology (Cardiology) Doreatha Massed, MD as Medical Oncologist (Medical Oncology) Therese Sarah, RN as Oncology Nurse Navigator (Medical Oncology)   ASSESSMENT & PLAN:   Assessment: 1. Stage IVb high-grade B-cell lymphoma: - He had unintentional weight loss of 20 pounds since September 2022.  Weight has been stable for the last 2 months.  He had multiple family members with pancreatic cancer. - CT AP with contrast (10/31/2021): Abnormal nodular thickening of the right anterior pararenal fascia, suspicious for atypical malignancy.  No evidence of pancreatic malignancy.  Irregular wall thickening of the proximal stomach.  No ascites or peritoneal nodularity. - Biopsy of nodular thickening (11/17/2021): High-grade B-cell lymphoma with Burkitt-like features.  Ki-67 100%. 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  Neoplastic lymphocytes positive for CD20, CD10, Bcl-2, BCL6 and mum 1.  Ki-67 more than 95%.  H pylori IHC negative. - High-grade lymphoma panel (11/17/2021): Negative for BCL6, MYC, BCL2 rearrangements, negative for MYC amplification, t(8:14) not detected. - High risk features for CNS disease: Age more than 60, stage III/IV, extranodal involvement more than 1 site.  Based on 3 points, intermediate risk.  No clinical signs of CNS involvement. - IPI: High intermediate with 3 points (age more than 60, stage III/IV, extranodal involvement more than 1 site) - 6 cycles of R-CEOP from 12/15/2021 through 03/31/2022 - PET scan (04/30/2022): No evidence of lymphoma (Deauville 1 response).   2. Social/family history: - He  has AICD placed in June 2021 secondary to CHF. - He is a retired Cytogeneticist.  He worked at [employer] in [place].  He had exposure to some chemicals.  He quit smoking more than 40 years ago.  He drinks mostly beer and occasionally whiskey at bedtime.   3. Ischemic cardiomyopathy/CHF/apical thrombus: - He is on Coumadin.  Will check PT/INR today. - Last seen by Dr. Jens Som on 11/06/2021.   -Last echo on 12/27/2020 with EF 30-35%.  Limited visualization of endocardium.  LV has moderately decreased function.  Plan: Stage IVb high-grade B-cell lymphoma: - He does not report any B symptoms. - Reviewed labs: Creatinine elevated at 1.41.  CBC grossly normal with mild thrombocytopenia. - Reviewed CT chest on 02/19/2023: No evidence of recurrent lymphoma. - Recommend follow-up in 6 months with repeat labs and exam.  Will do scans if clinical condition dictates.  2.  Elevated creatinine: - Creatinine has been elevated consistently since April of this year.  Patient reports that losartan dose was increased several months ago by his cardiologist. - Will make referral to Dr. Wolfgang Phoenix for further evaluation.  Orders Placed This Encounter  Procedures   CBC with Differential    Standing Status:   Future    Standing Expiration Date:   02/25/2024   Comprehensive metabolic panel    Standing Status:   Future    Standing Expiration Date:   02/25/2024   Lactate dehydrogenase    Standing Status:   Future    Standing Expiration Date:   02/25/2024      [name] [name],acting as a scribe for Doreatha Massed, MD.,have documented all relevant documentation on the behalf of Doreatha Massed, MD,as  directed by  Doreatha Massed, MD while in the presence of Doreatha Massed, MD.  I, Doreatha Massed MD, have reviewed the above documentation for accuracy and completeness, and I agree with the above.    Doreatha Massed, MD   7/18/20246:02 PM  CHIEF COMPLAINT:   Diagnosis: stage IVb high-grade  B-cell lymphoma    Cancer Staging  High grade B-cell lymphoma (HCC) Staging form: Hodgkin and Non-Hodgkin Lymphoma, AJCC 8th Edition - Clinical stage from 12/03/2021: Stage IV (Diffuse large B-cell lymphoma) - Unsigned    Prior Therapy: R-CEOP q21d x 6 Cycles   Current Therapy:  surveillance   HISTORY OF PRESENT ILLNESS:   Oncology History  High grade B-cell lymphoma (HCC)  12/03/2021 Initial Diagnosis   High grade B-cell lymphoma (HCC)   12/15/2021 - 04/03/2022 Chemotherapy   Patient is on Treatment Plan : NON-HODGKIN'S LYMPHOMA R-CEOP q21d x 3 Cycles        INTERVAL HISTORY:   [name] [name] is a 77 y.o. male presenting to clinic today for follow up of stage IVb high-grade B-cell lymphoma. He was last seen by me on 11/19/22.  Since his last visit, he had a CT of the chest on 7/12 that found no evidence of recurrent lymphoma, aortic atherosclerosis, and coronary artery calcification.   He underwent an incision and drainage of right leg seroma with drain placement and myriad morcells on 5/31. Microbiology found negative for MRSA and Staphylococcus.   Today, he states that he is doing well overall. His appetite level is at 100%. His energy level is at 90%.  PAST MEDICAL HISTORY:   Past Medical History: Past Medical History:  Diagnosis Date   AICD (automatic cardioverter/defibrillator) present    ALLERGIC RHINITIS 10/31/2007   Qualifier: Diagnosis of  By: Jonny Ruiz MD, Len Blalock    Allergy    Cancer (HCC) 12/03/2021   non-Hodgkin's B-cell lymphoma   CHOLECYSTECTOMY, HX OF 06/04/2007   Qualifier: Diagnosis of  By: Genelle Gather CMA, [place]     Chronic HFrEF (heart failure with reduced ejection fraction) (HCC)    COLONIC POLYPS, HX OF 10/31/2007   Qualifier: Diagnosis of  By: Jonny Ruiz MD, Len Blalock    CORONARY ARTERY BYPASS GRAFT, HX OF 06/04/2007   Qualifier: Diagnosis of  By: Genelle Gather CMA, [place]     CORONARY ARTERY DISEASE 06/04/2007   Qualifier: Diagnosis of  By: Genelle Gather CMA, [place]      Diverticulosis    Erectile dysfunction    Eye twitch    right eye since chilhood    History of hiatal hernia    HYPERLIPIDEMIA 06/04/2007   Qualifier: Diagnosis of  By: Genelle Gather CMA, [place]     Hypersomnolence    HYPERTENSION 06/04/2007   Qualifier: Diagnosis of  By: Genelle Gather CMA, [place]     Hypothyroidism    pt does not take medications and doesn't know if he has it   ISCHEMIC CARDIOMYOPATHY 06/04/2007   Qualifier: Diagnosis of  By: Genelle Gather CMA, [place]     Ischemic cardiomyopathy    LV (left ventricular) mural thrombus 02/15/2019   Myocardial infarction [facility])    per patient , his occurred in 1998    Osteoarthritis    Peripheral vascular disease (HCC)    PLMD (periodic limb movement disorder)    Pneumonia    Vertigo    Vitamin D deficiency     Surgical History: Past Surgical History:  Procedure Laterality Date   BIOPSY  11/25/2021   Procedure: BIOPSY;  Surgeon: Lynann Bologna, MD;  Location: [facility];  Service: Gastroenterology;;   CABG  1999   [facility] [place], [place].  Patient reports that a small vein was taken from under his arm and a small incesion was made.   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 05/20/2022   Procedure: LIGATION OF POPLITEAL ARTERY ANEURYSM;  Surgeon: Cephus Shelling, MD;  Location: [facility] OR;  Service: Vascular;  Laterality: Right;   ESOPHAGOGASTRODUODENOSCOPY (EGD) WITH PROPOFOL N/A 11/25/2021   Procedure: ESOPHAGOGASTRODUODENOSCOPY (EGD) WITH PROPOFOL;  Surgeon: Lynann Bologna, MD;  Location: [facility] ENDOSCOPY;  Service: Gastroenterology;  Laterality: N/A;   FEMORAL-POPLITEAL BYPASS GRAFT Right 05/20/2022   Procedure: RIGHT ABOVE KNEE POPLITEAL ARTERY TO TIBIOPERONEAL TRUNK/POSTERIOR  TIBIAL ARTERY BYPASS;  Surgeon: Cephus Shelling, MD;  Location[facility];  Service: Vascular;  Laterality: Right;   ICD IMPLANT N/A 01/30/2020   Procedure: ICD IMPLANT;  Surgeon: Hillis Range, MD;  Location: [facility] INVASIVE CV LAB;  Service: Cardiovascular;  Laterality: N/A;   INCISION AND DRAINAGE Right 01/08/2023   Procedure: INCISION AND DRAINAGE OF RIGHT LEG  SEROMA, DRAIN PLACEMENT AND MYRIAD MORCELLS;  Surgeon: Cephus Shelling, MD;  Location: [facility] OR;  Service: Vascular;  Laterality: Right;   IR IMAGING GUIDED PORT INSERTION  12/04/2021   KNEE ARTHROPLASTY Left 08/05/2017   Procedure: LEFT TOTAL KNEE ARTHROPLASTY WITH COMPUTER NAVIGATION;  Surgeon: Samson Frederic, MD;  Location: [facility] ORS;  Service: Orthopedics;  Laterality: Left;  Needs RNFA   LIGATION OF CILIAC ARTERY Right 05/20/2022   Procedure: LIGATION OF RIGHT ANTERIOR TIBIAL ARTERY;  Surgeon: Cephus Shelling, MD;  Location: [facility] OR;  Service: Vascular;  Laterality: Right;   LOWER EXTREMITY ANGIOGRAPHY Right 05/14/2022   Procedure: Lower Extremity Angiography;  Surgeon: Cephus Shelling, MD;  Location: [facility] INVASIVE CV LAB;  Service: Cardiovascular;  Laterality: Right;   PENILE PROSTHESIS IMPLANT     POLYPECTOMY  11/25/2021   Procedure: POLYPECTOMY;  Surgeon: Lynann Bologna, MD;  Location: [facility] ENDOSCOPY;  Service: Gastroenterology;;   REPLACEMENT TOTAL KNEE  06/12/2013   spinal cyst     THORACOTOMY     left anterior; wound exploration and debridement   TONSILLECTOMY AND ADENOIDECTOMY     age 63   VEIN HARVEST Right 05/20/2022   Procedure: RIGHT GREATER SAPHENOUS VEIN HARVEST;  Surgeon: Cephus Shelling, MD;  Location: [facility] OR;  Service: Vascular;  Laterality: Right;    Social History: Social History   Socioeconomic History   Marital status: Married    Spouse name: Kathie Rhodes   Number of children: 3   Years of education: Not on file   Highest education level: Not on file  Occupational History   Occupation: truck Clinical research associate: [employer]  Tobacco Use   Smoking status: Former    Current packs/day: 0.00    Average packs/day: 4.0 packs/day for 17.0 years (68.0 ttl pk-yrs)    Types: Cigarettes    Start date: 05/07/1964    Quit date: 05/07/1981    Years since quitting: 41.8    Passive exposure: Past   Smokeless tobacco: Never   Tobacco comments:    Pt states that he would let most of them "burn" pt states that he used anywhere between 4-5PPD  Vaping Use  Vaping status: Never Used  Substance and Sexual Activity   Alcohol use: Yes    Alcohol/week: 2.0 standard drinks of alcohol    Types: 2 Cans of beer per week   Drug use: No   Sexual activity: Not on file  Other Topics Concern   Not on file  Social History Narrative   Lives in [place] with spouse   Retired Naval architect   Social Determinants of Health   Financial Resource Strain: Low Risk  (10/29/2022)   Overall Financial Resource Strain (CARDIA)    Difficulty of Paying Living Expenses: Not hard at all  Food Insecurity: No Food Insecurity (10/29/2022)   Hunger Vital Sign    Worried About Running Out of Food in the Last Year: Never true    Ran Out of Food in the Last Year: Never true  Transportation Needs: No Transportation Needs (10/29/2022)   PRAPARE - Administrator, Civil Service (Medical): No    Lack of Transportation (Non-Medical): No  Physical Activity: Inactive (10/29/2022)   Exercise Vital Sign    Days of Exercise per Week: 0 days    Minutes of Exercise per Session: 0 min  Stress: No Stress Concern Present (10/29/2022)   [employer] of Occupational Health - Occupational Stress Questionnaire    Feeling of Stress : Not at all  Social Connections: Socially Integrated (10/29/2022)   Social Connection and Isolation Panel [NHANES]    Frequency of Communication with Friends and Family: More than three times a week    Frequency of Social Gatherings with Friends and Family: More than three times a week    Attends  Religious Services: More than 4 times per year    Active Member of [employer] or Organizations: Yes    Attends Engineer, structural: More than 4 times per year    Marital Status: Married  Catering manager Violence: Not At Risk (10/29/2022)   Humiliation, Afraid, Rape, and Kick questionnaire    Fear of Current or Ex-Partner: No    Emotionally Abused: No    Physically Abused: No    Sexually Abused: No    Family History: Family History  Problem Relation Age of Onset   Stomach cancer Mother        smokes   Lung cancer Father        chewed tobacco   Heart disease Brother        first MI at 13yo, now 44 for transplant list/ ICM   Pancreatic cancer Brother    Colon cancer Maternal Grandfather    COPD Son        was a smoker   Pancreatic cancer Maternal Uncle    Pancreatic cancer Cousin        mat side x 4   Esophageal cancer Neg Hx    Prostate cancer Neg Hx    Rectal cancer Neg Hx     Current Medications:  Current Outpatient Medications:    Ascorbic Acid (VITA-C PO), Take 1,000 mg by mouth daily., Disp: , Rfl:    aspirin EC 81 MG tablet, Take 1 tablet (81 mg total) by mouth daily at 6 (six) AM. Swallow whole., Disp: 30 tablet, Rfl: 12   b complex vitamins capsule, Take 1 capsule by mouth daily., Disp: , Rfl:    cholecalciferol (VITAMIN D3) 25 MCG (1000 UNIT) tablet, Take 1,000 Units by mouth daily., Disp: , Rfl:    empagliflozin (JARDIANCE) 10 MG TABS tablet, Take 5 mg by mouth daily.,  Disp: , Rfl:    ezetimibe (ZETIA) 10 MG tablet, TAKE 1 TABLET EVERY DAY, Disp: 90 tablet, Rfl: 2   feeding supplement (ENSURE ENLIVE / ENSURE PLUS) LIQD, Take 237 mLs by mouth 3 (three) times daily between meals., Disp: 237 mL, Rfl: 12   furosemide (LASIX) 20 MG tablet, Take 2 tablets (40 mg total) by mouth daily., Disp: 180 tablet, Rfl: 3   losartan (COZAAR) 25 MG tablet, Take 0.5 tablets (12.5 mg total) by mouth daily., Disp: 30 tablet, Rfl: 0   meloxicam (MOBIC) 15 MG tablet, Take 15 mg by  mouth daily., Disp: , Rfl:    metoprolol succinate (TOPROL XL) 25 MG 24 hr tablet, Take 1 tablet (25 mg total) by mouth daily., Disp: 90 tablet, Rfl: 3   Multiple Vitamins-Minerals (MULTIVITAMIN WITH MINERALS) tablet, Take 1 tablet by mouth daily. Centrum silver, Disp: , Rfl:    oxyCODONE-acetaminophen (PERCOCET) 5-325 MG tablet, Take 1 tablet by mouth every 6 (six) hours as needed for severe pain., Disp: 16 tablet, Rfl: 0   potassium chloride SA (KLOR-CON M) 20 MEQ tablet, Take 20 mEq by mouth daily., Disp: , Rfl:    rosuvastatin (CRESTOR) 5 MG tablet, TAKE 1 TABLET EVERY OTHER DAY, Disp: 45 tablet, Rfl: 3   spironolactone (ALDACTONE) 25 MG tablet, Take by mouth., Disp: , Rfl:    warfarin (COUMADIN) 5 MG tablet, Take 1 tablet daily except 1 1/2 tablets on Mondays or as directed. (Patient taking differently: Take 2.5-5 mg by mouth See admin instructions. Take 5 mg  tablet daily EXCEPT: Tuesday take 2.5 mg  in the morning or as directed.), Disp: 90 tablet, Rfl: 5   Allergies: Allergies  Allergen Reactions   Ace Inhibitors Cough   Statins Other (See Comments)    Muscle pains    REVIEW OF SYSTEMS:   Review of Systems  Constitutional:  Negative for chills, fatigue and fever.  HENT:   Negative for lump/mass, mouth sores, nosebleeds, sore throat and trouble swallowing.   Eyes:  Negative for eye problems.  Respiratory:  Negative for cough and shortness of breath.   Cardiovascular:  Positive for palpitations. Negative for chest pain and leg swelling.  Gastrointestinal:  Negative for abdominal pain, constipation, diarrhea, nausea and vomiting.  Genitourinary:  Negative for bladder incontinence, difficulty urinating, dysuria, frequency, hematuria and nocturia.   Musculoskeletal:  Negative for arthralgias, back pain, flank pain, myalgias and neck pain.  Skin:  Negative for itching and rash.  Neurological:  Positive for dizziness and numbness. Negative for headaches.  Hematological:  Does not  bruise/bleed easily.  Psychiatric/Behavioral:  Positive for sleep disturbance. Negative for depression and suicidal ideas. The patient is not nervous/anxious.   All other systems reviewed and are negative.    VITALS:   Blood pressure 120/65, pulse 64, temperature (!) 96.9 F (36.1 C), temperature source Tympanic, resp. rate 16, weight 196 lb 4.8 oz (89 kg), SpO2 95%.  Wt Readings from Last 3 Encounters:  02/25/23 196 lb 4.8 oz (89 kg)  02/05/23 196 lb 3.2 oz (89 kg)  01/22/23 182 lb (82.6 kg)    Body mass index is 27.38 kg/m.  Performance status (ECOG): 1 - Symptomatic but completely ambulatory  PHYSICAL EXAM:   Physical Exam Vitals and nursing note reviewed. Exam conducted with a chaperone present.  Constitutional:      Appearance: Normal appearance.  Cardiovascular:     Rate and Rhythm: Normal rate and regular rhythm.     Pulses: Normal pulses.  Heart sounds: Normal heart sounds.  Pulmonary:     Effort: Pulmonary effort is normal.     Breath sounds: Normal breath sounds.  Abdominal:     Palpations: Abdomen is soft. There is no hepatomegaly, splenomegaly or mass.     Tenderness: There is no abdominal tenderness.  Musculoskeletal:     Right lower leg: No edema.     Left lower leg: No edema.  Lymphadenopathy:     Cervical: No cervical adenopathy.     Right cervical: No superficial, deep or posterior cervical adenopathy.    Left cervical: No superficial, deep or posterior cervical adenopathy.     Upper Body:     Right upper body: No supraclavicular or axillary adenopathy.     Left upper body: No supraclavicular or axillary adenopathy.  Neurological:     General: No focal deficit present.     Mental Status: He is alert and oriented to person, place, and time.  Psychiatric:        Mood and Affect: Mood normal.        Behavior: Behavior normal.     LABS:      Latest Ref Rng & Units 02/18/2023   10:01 AM 01/08/2023    7:38 AM 11/23/2022   12:39 PM  CBC  WBC 4.0  - 10.5 K/uL 5.4  5.3  5.5   Hemoglobin 13.0 - 17.0 g/dL 24.4  01.0  27.2   Hematocrit 39.0 - 52.0 % 42.9  43.6  39.5   Platelets 150 - 400 K/uL 132  112  250       Latest Ref Rng & Units 02/18/2023   10:01 AM 01/08/2023    7:38 AM 12/04/2022   10:39 AM  CMP  Glucose 70 - 99 mg/dL 536  644    BUN 8 - 23 mg/dL 30  31    Creatinine 0.34 - 1.24 mg/dL 7.42  5.95    Sodium 638 - 145 mmol/L 135  137    Potassium 3.5 - 5.1 mmol/L 4.4  3.8    Chloride 98 - 111 mmol/L 101  100    CO2 22 - 32 mmol/L 27  25    Calcium 8.9 - 10.3 mg/dL 8.7  9.1    Total Protein 6.5 - 8.1 g/dL 6.9   6.5   Total Bilirubin 0.3 - 1.2 mg/dL 0.9   0.9   Alkaline Phos 38 - 126 U/L 62   61   AST 15 - 41 U/L 19   31   ALT 0 - 44 U/L 18   24      No results found for: "CEA1", "CEA" / No results found for: "CEA1", "CEA" No results found for: "PSA1" No results found for: "VFI433" No results found for: "CAN125"  No results found for: "TOTALPROTELP", "ALBUMINELP", "A1GS", "A2GS", "BETS", "BETA2SER", "GAMS", "MSPIKE", "SPEI" Lab Results  Component Value Date   IRONPCTSAT 23.0 03/22/2019   Lab Results  Component Value Date   LDH 182 12/04/2022   LDH 394 (H) 11/12/2022   LDH 93 (L) 08/13/2022     STUDIES:   CT Chest W Contrast  Result Date: 02/24/2023 CLINICAL DATA:  Lymphoma status post chemotherapy. * Tracking Code: BO * EXAM: CT CHEST WITH CONTRAST TECHNIQUE: Multidetector CT imaging of the chest was performed during intravenous contrast administration. RADIATION DOSE REDUCTION: This exam was performed according to the departmental dose-optimization program which includes automated exposure control, adjustment of the mA and/or kV according to patient size and/or  use of iterative reconstruction technique. CONTRAST:  75mL OMNIPAQUE IOHEXOL 300 MG/ML  SOLN COMPARISON:  11/12/2022. FINDINGS: Cardiovascular: Right IJ Port-A-Cath terminates at the SVC RA junction. Atherosclerotic calcification of the aorta and coronary  arteries. Heart is at the upper limits of normal in size to mildly enlarged. No pericardial effusion. Mediastinum/Nodes: No pathologically enlarged mediastinal, hilar or axillary lymph nodes. Esophagus is grossly unremarkable. Lungs/Pleura: Pleuroparenchymal scarring in the lingula and right lower lobe. Lungs are otherwise clear. No pleural fluid. Airway is unremarkable. Upper Abdomen: Visualized portions of the liver, adrenal glands, kidneys, spleen, pancreas, stomach and bowel are grossly unremarkable. Cholecystectomy. No upper abdominal adenopathy. Musculoskeletal: Degenerative changes in the spine. No worrisome lytic or sclerotic lesions. IMPRESSION: 1. No evidence of recurrent lymphoma. 2. Aortic atherosclerosis (ICD10-I70.0). Coronary artery calcification. Electronically Signed   By: Leanna Battles M.D.   On: 02/24/2023 14:44   CUP PACEART REMOTE DEVICE CHECK  Result Date: 01/29/2023 Scheduled remote reviewed. Normal device function.  2 NSVT, one possible SVT, no therapy 1 SVT, duration 51sec, HR 222 Next remote 91 days. [place], CVRS

## 2023-02-25 ENCOUNTER — Inpatient Hospital Stay: Payer: No Typology Code available for payment source | Admitting: Hematology

## 2023-02-25 VITALS — BP 120/65 | HR 64 | Temp 96.9°F | Resp 16 | Wt 196.3 lb

## 2023-02-25 DIAGNOSIS — C8510 Unspecified B-cell lymphoma, unspecified site: Secondary | ICD-10-CM | POA: Diagnosis not present

## 2023-02-25 DIAGNOSIS — C8339 Diffuse large B-cell lymphoma, extranodal and solid organ sites: Secondary | ICD-10-CM | POA: Diagnosis not present

## 2023-02-25 NOTE — Patient Instructions (Addendum)
[facility] at [facility] Discharge Instructions   You were seen and examined today by Dr. Ellin Saba.  He reviewed the results of your lab work which are normal/stable.   He reviewed the results of your CT scan which is normal.   We will see you back in 6 months. We will repeat lab work prior to this visit.    Return as scheduled.    Thank you for choosing [facility] at [facility] to provide your oncology and hematology care.  To afford each patient quality time with our provider, please arrive at least 15 minutes before your scheduled appointment time.   If you have a lab appointment with the Cancer Center please come in thru the Main Entrance and check in at the main information desk.  You need to re-schedule your appointment should you arrive 10 or more minutes late.  We strive to give you quality time with our providers, and arriving late affects you and other patients whose appointments are after yours.  Also, if you no show three or more times for appointments you may be dismissed from the clinic at the providers discretion.     Again, thank you for choosing [facility].  Our hope is that these requests will decrease the amount of time that you wait before being seen by our physicians.       _____________________________________________________________  Should you have questions after your visit to [facility], please contact our office at [phone] and follow the prompts.  Our office hours are 8:00 a.m. and 4:30 p.m. Monday - Friday.  Please note that voicemails left after 4:00 p.m. may not be returned until the following business day.  We are closed weekends and major holidays.  You do have access to a nurse 24-7, just call the main number to the clinic [phone] and do not press any options, hold on the line and a nurse will answer the phone.    For prescription refill requests, have your pharmacy  contact our office and allow 72 hours.    Due to Covid, you will need to wear a mask upon entering the hospital. If you do not have a mask, a mask will be given to you at the Main Entrance upon arrival. For doctor visits, patients may have 1 support person age 57 or older with them. For treatment visits, patients can not have anyone with them due to social distancing guidelines and our immunocompromised population.

## 2023-03-01 ENCOUNTER — Inpatient Hospital Stay: Payer: No Typology Code available for payment source | Admitting: Hematology

## 2023-03-01 ENCOUNTER — Ambulatory Visit: Payer: Medicare HMO | Attending: Cardiovascular Disease

## 2023-03-01 DIAGNOSIS — Z95810 Presence of automatic (implantable) cardiac defibrillator: Secondary | ICD-10-CM

## 2023-03-01 DIAGNOSIS — I5022 Chronic systolic (congestive) heart failure: Secondary | ICD-10-CM

## 2023-03-02 NOTE — Progress Notes (Signed)
Spoke with patient and heart failure questions reviewed.  Transmission results reviewed and advised today it is suggesting slight dryness.  He said he was on the mower today when the episode occurred.  Explained the symptoms he reports can be of dehydration and then resolved after drinking water.  Advised to stay hydrated when outdoors.      03/02/2023 Optivol thoracic impedance showing slight dryness today.

## 2023-03-02 NOTE — Progress Notes (Signed)
EPIC Encounter for ICM Monitoring  Patient Name: [name] [name] is a 77 y.o. male Date: 03/02/2023 Primary Care Physican: Corwin Levins, MD Primary Cardiologist: Jens Som Electrophysiologist: Mealor 04/20/2022 Not weighing at home 05/07/2022 Weight: 181 lbs (does not weigh consistently at home) 09/29/2022 Office Weight: 190 lbs 11/17/2022 Weight: 185 lbs                                                            Spoke with patient and heart failure questions reviewed.  Transmission results reviewed.  Pt woke up this morning feeling sick with generalized weakness. He felt back to normal after drinking some water and resting Attempted to send remote transmission from home but monitor was not working.    DIET:  Pt uses salt shaker at home and does not follow low salt diet.  Also eats restaurant foods.     Optivol thoracic impedance suggesting possible fluid accumulation from 6/15-7/4.       Prescribed: Furosemide 20 mg take 2 tablet(s) (40 mg total) by mouth daily   Spironolactone 25 mg take 1 tablet by mouth daily Jardiance 10 mg take 1 tablet by mouth daily   Labs: 01/08/2023 Creatinine 1.37, BUN 31, Potassium 3.8, Sodium 137, GFR 53 11/23/2022 Creatinine 1.68, BUN 42, Potassium 3.9, Sodium 135, GFR 42 11/12/2022 Creatinine 1.21, BUN 28, Potassium 3.8, Sodium 137, GFR >60 09/25/2022 Creatinine 1.37, BUN 30, Potassium 3.8, Sodium 3.8, GFR 53 08/13/2022 Creatinine 1.04, BUN 21, Potassium 3.5, Sodium 138  A complete set of results can be found in Results Review.   Recommendations:   Advised to use ER if he has any episodes of feeling weak or lightheadedness.  No changes and encouraged to call if experiencing any fluid symptoms.   Follow-up plan: ICM clinic phone appointment on 04/05/2023.   91 day device clinic remote transmission 04/27/2023.     EP/Cardiology Office Visits:   Recall 08/23/2023 with Dr Jens Som.  Recall 07/25/2023 with Dr Nelly Laurence.    Copy of ICM check sent to Dr. Nelly Laurence.     3 month ICM trend: 03/01/2023.    12-14 Month ICM trend:     Karie Soda, RN 03/02/2023 2:28 PM

## 2023-03-08 ENCOUNTER — Telehealth: Payer: Self-pay

## 2023-03-08 NOTE — Telephone Encounter (Signed)
Caller: Patient  Concern: Incision at knee is swollen, red, painful. Pt took a picture, helped him upload to Mychart. Large circular area surrounding incision that post surgery had drain for 4 wks. No drainage at this time. Pt stated that he had fever, chills, and general malaise last week when this started.  Location: left leg  Description:  Began on Thurs/Fri of last week  Aggravating Factors: touching  Quality:  soreness  Resolution: Appointment scheduled for first available to evaluate for infection  Next Appt: Appointment scheduled for 7/30 @ 1330

## 2023-03-09 ENCOUNTER — Ambulatory Visit (INDEPENDENT_AMBULATORY_CARE_PROVIDER_SITE_OTHER): Payer: No Typology Code available for payment source | Admitting: Physician Assistant

## 2023-03-09 ENCOUNTER — Encounter: Payer: Self-pay | Admitting: Physician Assistant

## 2023-03-09 ENCOUNTER — Other Ambulatory Visit: Payer: Self-pay | Admitting: *Deleted

## 2023-03-09 VITALS — BP 101/66 | HR 62 | Temp 97.9°F | Resp 18 | Ht 71.0 in | Wt 193.8 lb

## 2023-03-09 DIAGNOSIS — L7634 Postprocedural seroma of skin and subcutaneous tissue following other procedure: Secondary | ICD-10-CM

## 2023-03-09 DIAGNOSIS — T8149XA Infection following a procedure, other surgical site, initial encounter: Secondary | ICD-10-CM

## 2023-03-09 MED ORDER — AMOXICILLIN-POT CLAVULANATE 875-125 MG PO TABS: 1.0000 | ORAL_TABLET | Freq: Two times a day (BID) | ORAL | 0 refills | Status: DC

## 2023-03-09 NOTE — Progress Notes (Signed)
[facility] contacted to pickup R thigh surgical wound culture.Confirmation #[id number]

## 2023-03-09 NOTE — Progress Notes (Signed)
POST OPERATIVE OFFICE NOTE    CC:  F/u for surgery  HPI:  This is a 77 y.o. male who is s/p  Right above knee popliteal artery to TP trunk/PT bypass with saphenous vein and ligation of the AT artery by Dr.Clark on 05/10/2022.  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  He subsequently underwent drainage and excision of right distal thigh seroma and drain placement on 01/08/2023 by Dr. Chestine Spore.   Post op, he continued to have increased serosanguinous drainage in the drain and it was left in place.    He was last seen on 01/22/2023 and at that time, he had consistent drainage from the JP drain of ~ 150cc/day but this did lessen and the drain was removed that day.  He was scheduled for 6 month follow up.  The pt called yesterday with concerns for infection.  Pt returns today for follow up and here with his wife.  Pt states the leg started getting red last week.  He states that Thursday night he did have some chills and he hasn't had much of an appetite until last night.  He states it has not been draining. He denies any pain in his foot or claudication sx.   They are going on an [place] cruise in mid September.     Allergies  Allergen Reactions   Ace Inhibitors Cough   Statins Other (See Comments)    Muscle pains    Current Outpatient Medications  Medication Sig Dispense Refill   Ascorbic Acid (VITA-C PO) Take 1,000 mg by mouth daily.     aspirin EC 81 MG tablet Take 1 tablet (81 mg total) by mouth daily at 6 (six) AM. Swallow whole. 30 tablet 12   b complex vitamins capsule Take 1 capsule by mouth daily.     cholecalciferol (VITAMIN D3) 25 MCG (1000 UNIT) tablet Take 1,000 Units by mouth daily.     empagliflozin (JARDIANCE) 10 MG TABS tablet Take 5 mg by mouth daily.     ezetimibe (ZETIA) 10 MG tablet TAKE 1 TABLET EVERY DAY 90 tablet 2   feeding supplement (ENSURE ENLIVE / ENSURE PLUS) LIQD Take 237 mLs by mouth 3  (three) times daily between meals. 237 mL 12   furosemide (LASIX) 20 MG tablet Take 2 tablets (40 mg total) by mouth daily. 180 tablet 3   losartan (COZAAR) 25 MG tablet Take 0.5 tablets (12.5 mg total) by mouth daily. 30 tablet 0   meloxicam (MOBIC) 15 MG tablet Take 15 mg by mouth daily.     metoprolol succinate (TOPROL XL) 25 MG 24 hr tablet Take 1 tablet (25 mg total) by mouth daily. 90 tablet 3   Multiple Vitamins-Minerals (MULTIVITAMIN WITH MINERALS) tablet Take 1 tablet by mouth daily. Centrum silver     oxyCODONE-acetaminophen (PERCOCET) 5-325 MG tablet Take 1 tablet by mouth every 6 (six) hours as needed for severe pain. 16 tablet 0   potassium chloride SA (KLOR-CON M) 20 MEQ tablet Take 20 mEq by mouth daily.     rosuvastatin (CRESTOR) 5 MG tablet TAKE 1 TABLET EVERY OTHER DAY 45 tablet 3   spironolactone (ALDACTONE) 25 MG tablet Take by mouth.     warfarin (COUMADIN) 5 MG tablet Take 1 tablet daily except 1 1/2 tablets on Mondays or as directed. (Patient taking differently: Take 2.5-5 mg by mouth See admin instructions. Take 5 mg  tablet daily EXCEPT: Tuesday take  2.5 mg  in the morning or as directed.) 90 tablet 5   No current facility-administered medications for this visit.     ROS:  See HPI  Physical Exam:  Today's Vitals   03/09/23 1310  BP: 101/66  Pulse: 62  Resp: 18  Temp: 97.9 F (36.6 C)  TempSrc: Temporal  SpO2: 96%  Weight: 193 lb 12.8 oz (87.9 kg)  Height: 5\' 11"  (1.803 m)   Body mass index is 27.03 kg/m.   Incision:  right below knee incision healed nicely.  Right above knee is erythematous and angry appearing and swollen  Extremities:  palpable right DP pulse     Assessment/Plan:  This is a 77 y.o. male who is s/p: Right above knee popliteal artery to TP trunk/PT bypass with saphenous vein and ligation of the AT artery by Dr.Clark on 05/10/2022.  This was done for a 4.7 cm right popliteal artery aneurysm involving the origin of the anterior tibial  artery.  He subsequently underwent drainage and excision of right distal thigh seroma and drain placement on 01/08/2023 by Dr. Chestine Spore.   -pt with probable infected seroma.  Dr. Chestine Spore did I&D in office and drained approximately 400-500cc seroma.  A culture was taken.  A wet to dry kerlix was packed in the wound and pt and his wife were instructed on how to pack a wet to dry dressing in the wound.  HH was ordered for wound care.  A prescription for Augmentin was sent to [facility] in [place] for 10 days.  Pt will f/u in 2 weeks for wound check.   -continue asa/statin   Doreatha Massed, [facility] [phone]   Clinic MD:  Chestine Spore

## 2023-03-10 ENCOUNTER — Other Ambulatory Visit: Payer: Self-pay | Admitting: Cardiology

## 2023-03-15 ENCOUNTER — Ambulatory Visit: Payer: Medicare HMO | Attending: Cardiology | Admitting: *Deleted

## 2023-03-15 DIAGNOSIS — I513 Intracardiac thrombosis, not elsewhere classified: Secondary | ICD-10-CM | POA: Diagnosis not present

## 2023-03-15 DIAGNOSIS — Z5181 Encounter for therapeutic drug level monitoring: Secondary | ICD-10-CM | POA: Diagnosis not present

## 2023-03-15 LAB — POCT INR: INR: 2.7 (ref 2.0–3.0)

## 2023-03-15 NOTE — Patient Instructions (Signed)
Finished chemo Continue 1 tablet daily except 1/2 tablet on Tuesdays Continue greens Recheck in 4 wks

## 2023-03-16 ENCOUNTER — Telehealth: Payer: Self-pay

## 2023-03-16 ENCOUNTER — Ambulatory Visit (INDEPENDENT_AMBULATORY_CARE_PROVIDER_SITE_OTHER): Payer: No Typology Code available for payment source | Admitting: Physician Assistant

## 2023-03-16 VITALS — BP 118/69 | HR 60 | Temp 98.4°F | Wt 192.6 lb

## 2023-03-16 DIAGNOSIS — T8149XA Infection following a procedure, other surgical site, initial encounter: Secondary | ICD-10-CM

## 2023-03-16 NOTE — Progress Notes (Signed)
POST OPERATIVE OFFICE NOTE    CC:  F/u for surgery  HPI: [name] [name] is a 77 year old male who is here as a triage visit.  He has a history of right above-knee popliteal artery to TP trunk bypass with saphenous vein and ligation of the AT artery by Dr. Chestine Spore on 05/10/2022.  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  Postoperatively he developed a large seroma of the distal thigh incision.  He underwent drainage and excision of the seroma with JP drain placement on 01/08/2023 by Dr. Chestine Spore.  His JP drain output significantly decreased with time and was removed on 01/22/2023.  Unfortunately the area around his distal thigh incision became erythematous and swollen again, and he underwent I/D of a likely infected seroma in the office on 7/30.  About 400 to 500cc of fluid was drained.  Cultures were taken, the wound was packed with a wet-to-dry dressing, and the patient was started on a 10-day course of Augmentin.  The patient returns today as a triage visit.  The patient states yesterday he had a large amount of malodorous, serosanguineous drainage from his I/D site yesterday.  The drainage soaked through one of his bandages.  His drainage is better today but is still malodorous.  He has been doing daily wet-to-dry dressing changes of the area.  He still has 3 days left of his Augmentin course.  He denies any fever, chills, nausea/vomiting.   Allergies  Allergen Reactions   Ace Inhibitors Cough   Statins Other (See Comments)    Muscle pains    Current Outpatient Medications  Medication Sig Dispense Refill   amoxicillin-clavulanate (AUGMENTIN) 875-125 MG tablet Take 1 tablet by mouth 2 (two) times daily. 20 tablet 0   Ascorbic Acid (VITA-C PO) Take 1,000 mg by mouth daily.     aspirin EC 81 MG tablet Take 1 tablet (81 mg total) by mouth daily at 6 (six) AM. Swallow whole. 30 tablet 12   b complex vitamins capsule Take 1 capsule  by mouth daily.     cholecalciferol (VITAMIN D3) 25 MCG (1000 UNIT) tablet Take 1,000 Units by mouth daily.     empagliflozin (JARDIANCE) 10 MG TABS tablet Take 5 mg by mouth daily.     ezetimibe (ZETIA) 10 MG tablet TAKE 1 TABLET EVERY DAY 90 tablet 2   feeding supplement (ENSURE ENLIVE / ENSURE PLUS) LIQD Take 237 mLs by mouth 3 (three) times daily between meals. 237 mL 12   furosemide (LASIX) 20 MG tablet Take 2 tablets (40 mg total) by mouth daily. 180 tablet 3   losartan (COZAAR) 25 MG tablet Take 0.5 tablets (12.5 mg total) by mouth daily. 30 tablet 0   meloxicam (MOBIC) 15 MG tablet Take 15 mg by mouth daily.     metoprolol succinate (TOPROL XL) 25 MG 24 hr tablet Take 1 tablet (25 mg total) by mouth daily. 90 tablet 3   Multiple Vitamins-Minerals (MULTIVITAMIN WITH MINERALS) tablet Take 1 tablet by mouth daily. Centrum silver     potassium chloride SA (KLOR-CON M) 20 MEQ tablet Take 20 mEq by mouth daily.     rosuvastatin (CRESTOR) 5 MG tablet TAKE 1 TABLET EVERY OTHER DAY 45 tablet 3   spironolactone (ALDACTONE) 25 MG tablet Take by mouth.     warfarin (COUMADIN) 5 MG tablet Take 1 tablet daily except 1 1/2 tablets on Mondays or as directed. (Patient taking differently: Take 2.5-5  mg by mouth See admin instructions. Take 5 mg  tablet daily EXCEPT: Tuesday take 2.5 mg  in the morning or as directed.) 90 tablet 5   No current facility-administered medications for this visit.     ROS:  See HPI  Physical Exam:   Incision: Right distal thigh wound malodorous but with healthy granulation tissue.  Moderate bloody drainage from the wound bed.  No puslike drainage, erythema, swelling, or necrosis Extremities: Palpable right DP pulse      Assessment/Plan:  This is a 77 y.o. male who is here for a triage visit  -The patient has had issues with an infected seroma of a right distal thigh incision s/p bypass in October 2023 -At his last visit with our office, Dr. Chestine Spore performed an I&D of  his seroma.  About 4 to 500 cc of serosanguineous fluid was drained from the area.  This wound was packed with a wet-to-dry dressing.  Cultures from the wound were taken and only resulted in skin flora.  He was given a course of antibiotics -He returns today as a triage visit.  Yesterday he had a short lived increase in serosanguineous drainage from the wound.  He states that this drainage was malodorous.  He has had no fevers or erythema of the wound.  He has been doing daily wet-to-dry dressing changes to the area -On exam the wound bed appears healthy with good granulation tissue.  There is moderate serosanguineous drainage from the area that is slightly malodorous.  There are no signs of infection on exam -I have encouraged the patient to continue his current antibiotic course.  He will continue daily wet-to-dry dressing changes of the area.  He will keep his appointment with our office on Tuesday the 13th for repeat wound check   Loel Dubonnet, PA-C [facility] [phone]   Clinic MD:  Lenell Antu

## 2023-03-16 NOTE — Telephone Encounter (Signed)
Caller: Patient  Concern: Large amount of malodorous, bloody, yellow drainage yesterday, soaked through bandage, ran down leg. Drainage is less today, but pt can still smell it through the bandages. He has been changing it daily as directed.   Pt denies fever, chills, N/V  Location: right leg  Description:  began yesterday  Treatments:  daily dressing changes, Augmentin (3 days of treatment remaining)  Resolution: Appointment scheduled for triage today to eval for infection  Next Appt: Appointment scheduled for 03/16/23 @ 1300 with PA.

## 2023-03-18 ENCOUNTER — Telehealth: Payer: Self-pay | Admitting: Hematology

## 2023-03-18 NOTE — Telephone Encounter (Signed)
Faxed a service request to the [place] for pts up coming appt to see Dr Wolfgang Phoenix.  Pending approval

## 2023-03-19 DIAGNOSIS — Z7901 Long term (current) use of anticoagulants: Secondary | ICD-10-CM | POA: Diagnosis not present

## 2023-03-19 DIAGNOSIS — I97648 Postprocedural seroma of a circulatory system organ or structure following other circulatory system procedure: Secondary | ICD-10-CM | POA: Diagnosis not present

## 2023-03-19 DIAGNOSIS — I10 Essential (primary) hypertension: Secondary | ICD-10-CM | POA: Diagnosis not present

## 2023-03-23 ENCOUNTER — Ambulatory Visit (INDEPENDENT_AMBULATORY_CARE_PROVIDER_SITE_OTHER): Payer: No Typology Code available for payment source | Admitting: Physician Assistant

## 2023-03-23 ENCOUNTER — Other Ambulatory Visit: Payer: Self-pay | Admitting: Cardiology

## 2023-03-23 VITALS — BP 107/73 | HR 90 | Temp 98.5°F | Resp 18 | Ht 71.0 in | Wt 192.5 lb

## 2023-03-23 DIAGNOSIS — T8149XA Infection following a procedure, other surgical site, initial encounter: Secondary | ICD-10-CM

## 2023-03-23 NOTE — Progress Notes (Signed)
POST OPERATIVE OFFICE NOTE    CC:  F/u for surgery  HPI:  This is a 77 y.o. male who underwent right above-the-knee to TP trunk bypass with vein by Dr. Chestine Spore in October 2023 due to a large popliteal aneurysm.  He underwent incision and drainage of seroma on 01/08/2023 by Dr. Chestine Spore.  He again developed a seroma and underwent I&D in the office on 7/30.  Cultures were negative.  He has been packing the wound with wet-to-dry dressing.  He states the drainage has slowed.  He denies rest pain, claudication, tissue loss bilateral lower extremities.  He is on a daily aspirin.  He is also on Coumadin.  Allergies  Allergen Reactions   Ace Inhibitors Cough   Statins Other (See Comments)    Muscle pains    Current Outpatient Medications  Medication Sig Dispense Refill   amoxicillin-clavulanate (AUGMENTIN) 875-125 MG tablet Take 1 tablet by mouth 2 (two) times daily. (Patient not taking: Reported on 03/23/2023) 20 tablet 0   Ascorbic Acid (VITA-C PO) Take 1,000 mg by mouth daily.     aspirin EC 81 MG tablet Take 1 tablet (81 mg total) by mouth daily at 6 (six) AM. Swallow whole. 30 tablet 12   b complex vitamins capsule Take 1 capsule by mouth daily.     cholecalciferol (VITAMIN D3) 25 MCG (1000 UNIT) tablet Take 1,000 Units by mouth daily.     empagliflozin (JARDIANCE) 10 MG TABS tablet Take 5 mg by mouth daily.     ezetimibe (ZETIA) 10 MG tablet TAKE 1 TABLET EVERY DAY 90 tablet 2   feeding supplement (ENSURE ENLIVE / ENSURE PLUS) LIQD Take 237 mLs by mouth 3 (three) times daily between meals. 237 mL 12   furosemide (LASIX) 20 MG tablet Take 2 tablets (40 mg total) by mouth daily. 180 tablet 3   losartan (COZAAR) 25 MG tablet Take 0.5 tablets (12.5 mg total) by mouth daily. 30 tablet 0   meloxicam (MOBIC) 15 MG tablet Take 15 mg by mouth daily.     metoprolol succinate (TOPROL XL) 25 MG 24 hr tablet Take 1 tablet (25 mg total) by mouth daily. 90 tablet 3   Multiple Vitamins-Minerals (MULTIVITAMIN  WITH MINERALS) tablet Take 1 tablet by mouth daily. Centrum silver     potassium chloride SA (KLOR-CON M) 20 MEQ tablet Take 20 mEq by mouth daily.     rosuvastatin (CRESTOR) 5 MG tablet TAKE 1 TABLET EVERY OTHER DAY 45 tablet 3   spironolactone (ALDACTONE) 25 MG tablet TAKE 1/2 TABLET EVERY DAY 45 tablet 3   warfarin (COUMADIN) 5 MG tablet Take 1 tablet daily except 1 1/2 tablets on Mondays or as directed. (Patient taking differently: Take 2.5-5 mg by mouth See admin instructions. Take 5 mg  tablet daily EXCEPT: Tuesday take 2.5 mg  in the morning or as directed.) 90 tablet 5   No current facility-administered medications for this visit.     ROS:  See HPI  Physical Exam:  Vitals:   03/23/23 1356  BP: 107/73  Pulse: 90  Resp: 18  Temp: 98.5 F (36.9 C)  TempSrc: Temporal  SpO2: 95%  Weight: 192 lb 8 oz (87.3 kg)  Height: 5\' 11"  (1.803 m)    Incision: Healthy appearing wound bed of the right thigh wound; no purulence or active drainage currently; wound measures 3 x 1 x 2cm Extremities: Palpable right DP pulse Neuro: A&O  Assessment/Plan:  This is a 77 y.o. male with open wound  of the above-the-knee saphenectomy incision having had recent I&D of seroma in the office  -Right foot well-perfused with palpable DP pulse.  The wound bed is healthy appearing with granulation tissue.  No sign of infection currently.  He we will continue packing the wound with gauze and cleansing with soap and water on a daily basis.  He will follow-up in a couple weeks for recheck.  Based on exam, no need to refill antibiotic prescription.  He knows to call/return office sooner with any questions or concerns.   Emilie Rutter, PA-C [facility] [phone]  Clinic MD:  Chestine Spore

## 2023-03-27 DIAGNOSIS — I10 Essential (primary) hypertension: Secondary | ICD-10-CM | POA: Diagnosis not present

## 2023-03-27 DIAGNOSIS — Z7901 Long term (current) use of anticoagulants: Secondary | ICD-10-CM | POA: Diagnosis not present

## 2023-03-27 DIAGNOSIS — I97648 Postprocedural seroma of a circulatory system organ or structure following other circulatory system procedure: Secondary | ICD-10-CM | POA: Diagnosis not present

## 2023-03-29 ENCOUNTER — Encounter: Payer: Self-pay | Admitting: Cardiology

## 2023-03-30 DIAGNOSIS — I10 Essential (primary) hypertension: Secondary | ICD-10-CM | POA: Diagnosis not present

## 2023-03-30 DIAGNOSIS — I97648 Postprocedural seroma of a circulatory system organ or structure following other circulatory system procedure: Secondary | ICD-10-CM | POA: Diagnosis not present

## 2023-03-30 DIAGNOSIS — Z7901 Long term (current) use of anticoagulants: Secondary | ICD-10-CM | POA: Diagnosis not present

## 2023-04-01 DIAGNOSIS — I10 Essential (primary) hypertension: Secondary | ICD-10-CM | POA: Diagnosis not present

## 2023-04-01 DIAGNOSIS — Z7901 Long term (current) use of anticoagulants: Secondary | ICD-10-CM | POA: Diagnosis not present

## 2023-04-01 DIAGNOSIS — I97648 Postprocedural seroma of a circulatory system organ or structure following other circulatory system procedure: Secondary | ICD-10-CM | POA: Diagnosis not present

## 2023-04-02 ENCOUNTER — Emergency Department (HOSPITAL_COMMUNITY)
Admission: EM | Admit: 2023-04-02 | Discharge: 2023-04-02 | Disposition: A | Discharge status: Home / Self Care | Payer: Non-veteran care | Attending: Emergency Medicine | Admitting: Emergency Medicine

## 2023-04-02 ENCOUNTER — Emergency Department (HOSPITAL_COMMUNITY): Payer: Non-veteran care

## 2023-04-02 ENCOUNTER — Other Ambulatory Visit: Payer: Self-pay

## 2023-04-02 ENCOUNTER — Encounter (HOSPITAL_COMMUNITY): Payer: Self-pay | Admitting: *Deleted

## 2023-04-02 DIAGNOSIS — Y990 Civilian activity done for income or pay: Secondary | ICD-10-CM | POA: Diagnosis not present

## 2023-04-02 DIAGNOSIS — E039 Hypothyroidism, unspecified: Secondary | ICD-10-CM | POA: Diagnosis not present

## 2023-04-02 DIAGNOSIS — Z79899 Other long term (current) drug therapy: Secondary | ICD-10-CM | POA: Insufficient documentation

## 2023-04-02 DIAGNOSIS — W268XXA Contact with other sharp object(s), not elsewhere classified, initial encounter: Secondary | ICD-10-CM | POA: Diagnosis not present

## 2023-04-02 DIAGNOSIS — Z7982 Long term (current) use of aspirin: Secondary | ICD-10-CM | POA: Insufficient documentation

## 2023-04-02 DIAGNOSIS — Z951 Presence of aortocoronary bypass graft: Secondary | ICD-10-CM | POA: Diagnosis not present

## 2023-04-02 DIAGNOSIS — S6991XA Unspecified injury of right wrist, hand and finger(s), initial encounter: Secondary | ICD-10-CM | POA: Diagnosis present

## 2023-04-02 DIAGNOSIS — Z7901 Long term (current) use of anticoagulants: Secondary | ICD-10-CM | POA: Insufficient documentation

## 2023-04-02 DIAGNOSIS — S61411A Laceration without foreign body of right hand, initial encounter: Secondary | ICD-10-CM | POA: Insufficient documentation

## 2023-04-02 MED ORDER — LIDOCAINE-EPINEPHRINE-TETRACAINE (LET) TOPICAL GEL
3.0000 mL | Freq: Once | TOPICAL | Status: AC
  Administered 2023-04-02: 3 mL via TOPICAL
  Filled 2023-04-02: qty 3

## 2023-04-02 MED ORDER — DOUBLE ANTIBIOTIC 500-10000 UNIT/GM EX OINT
TOPICAL_OINTMENT | Freq: Once | CUTANEOUS | Status: AC
  Administered 2023-04-02: 1 via TOPICAL
  Filled 2023-04-02: qty 1

## 2023-04-02 MED ORDER — LIDOCAINE HCL (PF) 1 % IJ SOLN
30.0000 mL | Freq: Once | INTRAMUSCULAR | Status: AC
  Administered 2023-04-02: 30 mL
  Filled 2023-04-02: qty 30

## 2023-04-02 NOTE — ED Notes (Addendum)
PA-C at bedside 

## 2023-04-02 NOTE — ED Triage Notes (Signed)
Pt cut palm of right hand while working on his car and his hand slipped.  Pt states he is on blood thinner Warfarin.

## 2023-04-02 NOTE — Discharge Instructions (Addendum)
Is a pleasure taking care of you today.  You were seen for a laceration to your right hand.  We put 9 sutures in the wound.  Keep it clean and dry.  Use antibiotic ointment topically.  If you notice any redness, swelling, increased pain, fever or drainage you need to come back right away.  Otherwise follow-up for a wound check with either your PCP or the orthopedic doctor to ensure proper healing.  Your sutures will need to be removed in approximately 7 to 10 days.  Minimize the use of your right hand to avoid tearing the sutures out

## 2023-04-02 NOTE — ED Provider Notes (Signed)
[facility] AT [facility] Provider Note   CSN: [id number] Arrival date & time: 04/02/23  1224     History  Chief Complaint  Patient presents with   Laceration    [name] [name] is a 77 y.o. male.  He has past medical history of CABG, hypothyroidism, anticoagulation with warfarin.  Presents for laceration of the right hand, states he was "messing around with his truck" and his hand slipped on the bumper and believes he cut his truck on the metal of the bumper.  Last tetanus shot was 4 years ago.  He denies numbness or tingling or limited range of motion, no foreign body sensation.   Laceration      Home Medications Prior to Admission medications   Medication Sig Start Date End Date Taking? Authorizing Provider  amoxicillin-clavulanate (AUGMENTIN) 875-125 MG tablet Take 1 tablet by mouth 2 (two) times daily. Patient not taking: Reported on 03/23/2023 03/09/23   Dara Lords, PA-C  Ascorbic Acid (VITA-C PO) Take 1,000 mg by mouth daily.    [provider]  aspirin EC 81 MG tablet Take 1 tablet (81 mg total) by mouth daily at 6 (six) AM. Swallow whole. 05/23/22   Baglia, Corrina, PA-C  b complex vitamins capsule Take 1 capsule by mouth daily.    [provider]  cholecalciferol (VITAMIN D3) 25 MCG (1000 UNIT) tablet Take 1,000 Units by mouth daily.    [provider]  empagliflozin (JARDIANCE) 10 MG TABS tablet Take 5 mg by mouth daily.    [provider]  ezetimibe (ZETIA) 10 MG tablet TAKE 1 TABLET EVERY DAY 12/07/22   Lewayne Bunting, MD  feeding supplement (ENSURE ENLIVE / ENSURE PLUS) LIQD Take 237 mLs by mouth 3 (three) times daily between meals. 01/21/22   Catarina Hartshorn, MD  furosemide (LASIX) 20 MG tablet Take 2 tablets (40 mg total) by mouth daily. 06/23/22   Lewayne Bunting, MD  losartan (COZAAR) 25 MG tablet Take 0.5 tablets (12.5 mg total) by mouth daily. 11/23/22 11/18/23  Gerhard Munch, MD   meloxicam (MOBIC) 15 MG tablet Take 15 mg by mouth daily.    [provider]  metoprolol succinate (TOPROL XL) 25 MG 24 hr tablet Take 1 tablet (25 mg total) by mouth daily. 02/05/23   Mealor, Roberts Gaudy, MD  Multiple Vitamins-Minerals (MULTIVITAMIN WITH MINERALS) tablet Take 1 tablet by mouth daily. Centrum silver    [provider]  potassium chloride SA (KLOR-CON M) 20 MEQ tablet Take 20 mEq by mouth daily. 02/17/23   [provider]  rosuvastatin (CRESTOR) 5 MG tablet TAKE 1 TABLET EVERY OTHER DAY 08/06/22   Lewayne Bunting, MD  warfarin (COUMADIN) 5 MG tablet Take 1 tablet daily except 1 1/2 tablets on Mondays or as directed. Patient taking differently: Take 2.5-5 mg by mouth See admin instructions. Take 5 mg  tablet daily EXCEPT: Tuesday take 2.5 mg  in the morning or as directed. 06/23/22   Lewayne Bunting, MD      Allergies    Ace inhibitors and Statins    Review of Systems   Review of Systems  Physical Exam Updated Vital Signs BP 126/68 (BP Location: Right Arm)   Pulse (!) 58   Temp 97.6 F (36.4 C) (Oral)   Resp 14   Ht 5\' 11"  (1.803 m)   Wt 87.1 kg   SpO2 99%   BMI 26.78 kg/m  Physical Exam Vitals and nursing note  reviewed.  Constitutional:      General: He is not in acute distress.    Appearance: He is well-developed.  HENT:     Head: Normocephalic and atraumatic.  Eyes:     Conjunctiva/sclera: Conjunctivae normal.  Cardiovascular:     Rate and Rhythm: Normal rate and regular rhythm.     Heart sounds: No murmur heard. Pulmonary:     Effort: Pulmonary effort is normal. No respiratory distress.     Breath sounds: Normal breath sounds.  Abdominal:     Palpations: Abdomen is soft.     Tenderness: There is no abdominal tenderness.  Musculoskeletal:        General: No swelling.     Cervical back: Neck supple.     Comments: Patient can make okay sign, thumbs up.  Normal sensation in the right hand, capillary refill in the fingers is  brisk.  Skin:    General: Skin is warm and dry.     Capillary Refill: Capillary refill takes less than 2 seconds.     Comments: Y shaped laceration in right first webspace  Neurological:     General: No focal deficit present.     Mental Status: He is alert and oriented to person, place, and time.  Psychiatric:        Mood and Affect: Mood normal.     ED Results / Procedures / Treatments   Labs (all labs ordered are listed, but only abnormal results are displayed) Labs Reviewed - No data to display  EKG None  Radiology DG Hand 2 View Right  Result Date: 04/02/2023 CLINICAL DATA:  Cut palm of right hand while working on car and right hand slipped. EXAM: RIGHT HAND - 2 VIEW COMPARISON:  None Available. FINDINGS: Neutral ulnar variance. Mild distal radioulnar joint space narrowing and peripheral osteophytosis. Mild-to-moderate thumb carpometacarpal joint space narrowing, subchondral sclerosis, peripheral osteophytosis with a 4 mm well corticated chronic ossicle laterally. Mild-to-moderate thumb interphalangeal and second and third DIP and mild fourth and fifth DIP joint space narrowing, subchondral sclerosis cystic change, and peripheral osteophytosis. Mild second through fifth PIP joint space narrowing. No acute fracture or dislocation. No radiopaque foreign body. IMPRESSION: 1. No acute fracture or dislocation.  No radiopaque foreign body. 2. Mild-to-moderate osteoarthritis as above. Electronically Signed   By: Neita Garnet M.D.   On: 04/02/2023 14:54    Procedures .Marland KitchenLaceration Repair  Date/Time: 04/02/2023 3:35 PM  Performed by: Ma Rings, PA-C Authorized by: Ma Rings, PA-C   Consent:    Consent obtained:  Verbal   Consent given by:  Patient   Risks, benefits, and alternatives were discussed: yes     Risks discussed:  Infection, pain, need for additional repair, nerve damage and poor wound healing Universal protocol:    Procedure explained and questions answered  to patient or proxy's satisfaction: yes     Imaging studies available: yes     Patient identity confirmed:  Verbally with patient Anesthesia:    Anesthesia method:  Topical application and local infiltration   Topical anesthetic:  LET   Local anesthetic:  Lidocaine 1% w/o epi Laceration details:    Location:  Hand   Hand location:  R palm   Length (cm):  4 Pre-procedure details:    Preparation:  Imaging obtained to evaluate for foreign bodies Exploration:    Hemostasis achieved with:  Direct pressure and LET   Imaging obtained: x-ray     Imaging outcome: foreign body not noted  Wound exploration: wound explored through full range of motion and entire depth of wound visualized     Contaminated: no   Treatment:    Area cleansed with:  Povidone-iodine   Amount of cleaning:  Standard   Irrigation solution:  Sterile saline   Irrigation method:  Syringe   Visualized foreign bodies/material removed: no     Debridement:  None   Undermining:  None Skin repair:    Repair method:  Sutures   Suture size:  5-0   Suture material:  Prolene   Suture technique: 8 simple interrupted sutures and one half buried mattress suture.   Number of sutures: 9. Approximation:    Approximation:  Close Repair type:    Repair type:  Simple Post-procedure details:    Dressing:  Antibiotic ointment and non-adherent dressing   Procedure completion:  Tolerated well, no immediate complications     Medications Ordered in ED Medications  polymixin-bacitracin (POLYSPORIN) ointment (has no administration in time range)  lidocaine-EPINEPHrine-tetracaine (LET) topical gel (3 mLs Topical Given 04/02/23 1402)  lidocaine (PF) (XYLOCAINE) 1 % injection 30 mL (30 mLs Infiltration Given by Other 04/02/23 1503)    ED Course/ Medical Decision Making/ A&P                                 Medical Decision Making Ddx: laceration, abrasion, foreign body, other  ED course: Patient has laceration to right palm, he  is on Coumadin, bleeding was easily controlled with pressure and topical let.  He has his INR checked routinely today.  No excessive bleeding to suggest supratherapeutic INR.  Vitals are reassuring.  Normal range of motion of hand, x-ray shows no foreign body, no fracture.  He had a V-shaped laceration approximately 4 cm in length total repaired with 9 sutures.  I did place a half buried mattress suture at the corner to try to preserve tissue at the corner of the wound.  Advised on follow-up for wound check in a couple days with your PCP or orthopedics.  Given strict return precautions.  Discussed wound care including keeping it clean, dry, using topical antibiotic ointment.  This was not a contaminated wound, do not feel need for empiric antibiotics at this time, especially given Coumadin use  Amount and/or Complexity of Data Reviewed Radiology: ordered and independent interpretation performed.    Details: No bony abnormalities and no foreign body on right hand x-ray, agree with radiology read  Risk OTC drugs. Prescription drug management.           Final Clinical Impression(s) / ED Diagnoses Final diagnoses:  Laceration of right hand without foreign body, initial encounter    Rx / DC Orders ED Discharge Orders     None         Josem Kaufmann 04/02/23 1539    Terrilee Files, MD 04/02/23 1815

## 2023-04-02 NOTE — ED Notes (Addendum)
Laceration on R hand dressed, ointment applied

## 2023-04-02 NOTE — ED Notes (Signed)
LET applied to hand laceration

## 2023-04-02 NOTE — ED Notes (Signed)
Bleeding controlled at this time.

## 2023-04-05 ENCOUNTER — Ambulatory Visit: Payer: Medicare HMO | Attending: Cardiovascular Disease

## 2023-04-05 DIAGNOSIS — Z95810 Presence of automatic (implantable) cardiac defibrillator: Secondary | ICD-10-CM

## 2023-04-05 DIAGNOSIS — I5022 Chronic systolic (congestive) heart failure: Secondary | ICD-10-CM | POA: Diagnosis not present

## 2023-04-05 NOTE — Progress Notes (Signed)
EPIC Encounter for ICM Monitoring  Patient Name: [name] [name] is a 77 y.o. male Date: 04/05/2023 Primary Care Physican: Corwin Levins, MD Primary Cardiologist: Jens Som Electrophysiologist: Mealor 04/20/2022 Not weighing at home 05/07/2022 Weight: 181 lbs (does not weigh consistently at home) 09/29/2022 Office Weight: 190 lbs 11/17/2022 Weight: 185 lbs 04/05/2023 Office Weight: 192 lbs                                                            Spoke with patient and heart failure questions reviewed.  Transmission results reviewed.  Pt denies any fluid symptoms but does have right leg and ankle swelling due to recent leg bypass in the back of his leg.    DIET:  Pt uses salt shaker at home and does not follow low salt diet.  Also eats restaurant foods.     Optivol thoracic impedance suggesting possible fluid accumulation starting 8/19.      Prescribed: Furosemide 20 mg take 2 tablet(s) (40 mg total) by mouth daily   Jardiance 10 mg take 1 tablet by mouth daily   Labs: 01/08/2023 Creatinine 1.37, BUN 31, Potassium 3.8, Sodium 137, GFR 53 11/23/2022 Creatinine 1.68, BUN 42, Potassium 3.9, Sodium 135, GFR 42 11/12/2022 Creatinine 1.21, BUN 28, Potassium 3.8, Sodium 137, GFR >60 09/25/2022 Creatinine 1.37, BUN 30, Potassium 3.8, Sodium 3.8, GFR 53 08/13/2022 Creatinine 1.04, BUN 21, Potassium 3.5, Sodium 138  A complete set of results can be found in Results Review.   Recommendations:   Advised to eliminate using salt shaker.  He leaves for [employer] 9/10.   Copy sent to Dr Jens Som for review and recommendations if needed.     Follow-up plan: ICM clinic phone appointment on 04/13/2023 (manual) to recheck fluid levels.   91 day device clinic remote transmission 04/27/2023.     EP/Cardiology Office Visits:   Recall 08/23/2023 with Dr Jens Som.  Recall 07/25/2023 with Dr Nelly Laurence.    Copy of ICM check sent to Dr. Nelly Laurence.    3 month ICM trend: 04/05/2023.    12-14 Month ICM trend:      Karie Soda, RN 04/05/2023 4:21 PM

## 2023-04-06 DIAGNOSIS — Z7901 Long term (current) use of anticoagulants: Secondary | ICD-10-CM | POA: Diagnosis not present

## 2023-04-06 DIAGNOSIS — I97648 Postprocedural seroma of a circulatory system organ or structure following other circulatory system procedure: Secondary | ICD-10-CM | POA: Diagnosis not present

## 2023-04-06 DIAGNOSIS — I10 Essential (primary) hypertension: Secondary | ICD-10-CM | POA: Diagnosis not present

## 2023-04-07 DIAGNOSIS — N2581 Secondary hyperparathyroidism of renal origin: Secondary | ICD-10-CM | POA: Diagnosis not present

## 2023-04-07 DIAGNOSIS — I5022 Chronic systolic (congestive) heart failure: Secondary | ICD-10-CM | POA: Diagnosis not present

## 2023-04-07 DIAGNOSIS — I129 Hypertensive chronic kidney disease with stage 1 through stage 4 chronic kidney disease, or unspecified chronic kidney disease: Secondary | ICD-10-CM | POA: Diagnosis not present

## 2023-04-07 DIAGNOSIS — N1831 Chronic kidney disease, stage 3a: Secondary | ICD-10-CM | POA: Diagnosis not present

## 2023-04-07 DIAGNOSIS — I701 Atherosclerosis of renal artery: Secondary | ICD-10-CM | POA: Diagnosis not present

## 2023-04-08 ENCOUNTER — Other Ambulatory Visit (HOSPITAL_COMMUNITY): Payer: Self-pay | Admitting: Nephrology

## 2023-04-08 DIAGNOSIS — N1831 Chronic kidney disease, stage 3a: Secondary | ICD-10-CM

## 2023-04-09 ENCOUNTER — Encounter: Payer: Self-pay | Admitting: Internal Medicine

## 2023-04-09 ENCOUNTER — Ambulatory Visit (INDEPENDENT_AMBULATORY_CARE_PROVIDER_SITE_OTHER): Payer: Medicare HMO | Admitting: Internal Medicine

## 2023-04-09 VITALS — BP 120/70 | HR 64 | Temp 98.6°F | Ht 71.0 in | Wt 192.0 lb

## 2023-04-09 DIAGNOSIS — R42 Dizziness and giddiness: Secondary | ICD-10-CM

## 2023-04-09 DIAGNOSIS — J309 Allergic rhinitis, unspecified: Secondary | ICD-10-CM

## 2023-04-09 DIAGNOSIS — S61411D Laceration without foreign body of right hand, subsequent encounter: Secondary | ICD-10-CM

## 2023-04-09 DIAGNOSIS — I10 Essential (primary) hypertension: Secondary | ICD-10-CM | POA: Diagnosis not present

## 2023-04-09 DIAGNOSIS — S61411A Laceration without foreign body of right hand, initial encounter: Secondary | ICD-10-CM | POA: Insufficient documentation

## 2023-04-09 DIAGNOSIS — E559 Vitamin D deficiency, unspecified: Secondary | ICD-10-CM

## 2023-04-09 MED ORDER — MONTELUKAST SODIUM 10 MG PO TABS: 10.0000 mg | ORAL_TABLET | Freq: Every day | ORAL | 11 refills | Status: DC

## 2023-04-09 MED ORDER — MECLIZINE HCL 12.5 MG PO TABS: 12.5000 mg | ORAL_TABLET | Freq: Three times a day (TID) | ORAL | 1 refills | Status: AC | PRN

## 2023-04-09 MED ORDER — GUAIFENESIN ER 600 MG PO TB12: 1200.0000 mg | ORAL_TABLET | Freq: Two times a day (BID) | ORAL | 2 refills | Status: AC | PRN

## 2023-04-09 NOTE — Progress Notes (Unsigned)
Patient ID: [name] [name], male   DOB: 09/20/1945, 77 y.o.   MRN: [id number]        Chief Complaint: follow up right hand laceration, allergies, eustachian tube dysfunction, vertigo       HPI:  [name] [name] is a 77 y.o. male here primarily with recent ED visit after accidental nip bite by his favorite dog of many years (not really with working on the truck, for some reason did not want to mention his dog at the ED visit)); s/p multiple stiches and healed well with intact wound without s/s infection today.  Does have several wks ongoing nasal allergy symptoms with clearish congestion, itch and sneezing, without fever, pain, ST, cough, swelling or wheezing  also with bilateral ear popping and crackling, and mild intermittent veritgo with head movement/        Wt Readings from Last 3 Encounters:  04/09/23 192 lb (87.1 kg)  04/02/23 192 lb (87.1 kg)  03/23/23 192 lb 8 oz (87.3 kg)   BP Readings from Last 3 Encounters:  04/09/23 120/70  04/02/23 122/64  03/23/23 107/73         Past Medical History:  Diagnosis Date   AICD (automatic cardioverter/defibrillator) present    ALLERGIC RHINITIS 10/31/2007   Qualifier: Diagnosis of  By: Jonny Ruiz MD, Len Blalock    Allergy    Cancer (HCC) 12/03/2021   non-Hodgkin's B-cell lymphoma   CHOLECYSTECTOMY, HX OF 06/04/2007   Qualifier: Diagnosis of  By: Genelle Gather CMA, [place]     Chronic HFrEF (heart failure with reduced ejection fraction) (HCC)    COLONIC POLYPS, HX OF 10/31/2007   Qualifier: Diagnosis of  By: Jonny Ruiz MD, Len Blalock    CORONARY ARTERY BYPASS GRAFT, HX OF 06/04/2007   Qualifier: Diagnosis of  By: Genelle Gather CMA, [place]     CORONARY ARTERY DISEASE 06/04/2007   Qualifier: Diagnosis of  By: Genelle Gather CMA, [place]     Diverticulosis    Erectile dysfunction    Eye twitch    right eye since chilhood    History of hiatal hernia    HYPERLIPIDEMIA 06/04/2007   Qualifier: Diagnosis of  By: Genelle Gather CMA, [place]     Hypersomnolence    HYPERTENSION  06/04/2007   Qualifier: Diagnosis of  By: Genelle Gather CMA, [place]     Hypothyroidism    pt does not take medications and doesn't know if he has it   ISCHEMIC CARDIOMYOPATHY 06/04/2007   Qualifier: Diagnosis of  By: Genelle Gather CMA, [place]     Ischemic cardiomyopathy    LV (left ventricular) mural thrombus 02/15/2019   Myocardial infarction [facility])    per patient , his occurred in 1998    Osteoarthritis    Peripheral vascular disease (HCC)    PLMD (periodic limb movement disorder)    Pneumonia    Vertigo    Vitamin D deficiency    Past Surgical History:  Procedure Laterality Date   BIOPSY  11/25/2021   Procedure: BIOPSY;  Surgeon: Lynann Bologna, MD;  Location: [facility];  Service: Gastroenterology;;   CABG  1999   [facility] [place], [place].  Patient reports that a small vein was taken from under his arm and a small incesion was made.   CHOLECYSTECTOMY     COLONOSCOPY     COLONOSCOPY WITH PROPOFOL N/A 11/25/2021   Procedure: COLONOSCOPY WITH PROPOFOL;  Surgeon: Lynann Bologna, MD;  Location: [facility];  Service: Gastroenterology;  Laterality: N/A;   ENDOVASCULAR REPAIR  OF POPLITEAL ARTERY ANEURYSM Right 05/20/2022   Procedure: LIGATION OF POPLITEAL ARTERY ANEURYSM;  Surgeon: Cephus Shelling, MD;  Location: [facility] OR;  Service: Vascular;  Laterality: Right;   ESOPHAGOGASTRODUODENOSCOPY (EGD) WITH PROPOFOL N/A 11/25/2021   Procedure: ESOPHAGOGASTRODUODENOSCOPY (EGD) WITH PROPOFOL;  Surgeon: Lynann Bologna, MD;  Location: [facility];  Service: Gastroenterology;  Laterality: N/A;   FEMORAL-POPLITEAL BYPASS GRAFT Right 05/20/2022   Procedure: RIGHT ABOVE KNEE POPLITEAL ARTERY TO TIBIOPERONEAL TRUNK/POSTERIOR TIBIAL ARTERY BYPASS;  Surgeon: Cephus Shelling, MD;  Location: [facility] OR;  Service: Vascular;  Laterality: Right;   ICD IMPLANT N/A 01/30/2020   Procedure: ICD IMPLANT;  Surgeon: Hillis Range, MD;  Location: [facility] INVASIVE CV LAB;  Service: Cardiovascular;   Laterality: N/A;   INCISION AND DRAINAGE Right 01/08/2023   Procedure: INCISION AND DRAINAGE OF RIGHT LEG  SEROMA, DRAIN PLACEMENT AND MYRIAD MORCELLS;  Surgeon: Cephus Shelling, MD;  Location: [facility] OR;  Service: Vascular;  Laterality: Right;   IR IMAGING GUIDED PORT INSERTION  12/04/2021   KNEE ARTHROPLASTY Left 08/05/2017   Procedure: LEFT TOTAL KNEE ARTHROPLASTY WITH COMPUTER NAVIGATION;  Surgeon: Samson Frederic, MD;  Location: [facility] ORS;  Service: Orthopedics;  Laterality: Left;  Needs RNFA   LIGATION OF CILIAC ARTERY Right 05/20/2022   Procedure: LIGATION OF RIGHT ANTERIOR TIBIAL ARTERY;  Surgeon: Cephus Shelling, MD;  Location: [facility] OR;  Service: Vascular;  Laterality: Right;   LOWER EXTREMITY ANGIOGRAPHY Right 05/14/2022   Procedure: Lower Extremity Angiography;  Surgeon: Cephus Shelling, MD;  Location: [facility] INVASIVE CV LAB;  Service: Cardiovascular;  Laterality: Right;   PENILE PROSTHESIS IMPLANT     POLYPECTOMY  11/25/2021   Procedure: POLYPECTOMY;  Surgeon: Lynann Bologna, MD;  Location: [facility];  Service: Gastroenterology;;   REPLACEMENT TOTAL KNEE  06/12/2013   spinal cyst     THORACOTOMY     left anterior; wound exploration and debridement   TONSILLECTOMY AND ADENOIDECTOMY     age 32   VEIN HARVEST Right 05/20/2022   Procedure: RIGHT GREATER SAPHENOUS VEIN HARVEST;  Surgeon: Cephus Shelling, MD;  Location: [facility] OR;  Service: Vascular;  Laterality: Right;    reports that he quit smoking about 41 years ago. His smoking use included cigarettes. He started smoking about 58 years ago. He has a 68 pack-year smoking history. He has been exposed to tobacco smoke. He has never used smokeless tobacco. He reports current alcohol use of about 2.0 standard drinks of alcohol per week. He reports that he does not use drugs. family history includes COPD in his son; Colon cancer in his maternal grandfather; Heart disease in his brother; Lung cancer in his father; Pancreatic cancer in  his brother, cousin, and maternal uncle; Stomach cancer in his mother. Allergies  Allergen Reactions   Ace Inhibitors Cough   Statins Other (See Comments)    Muscle pains   Current Outpatient Medications on File Prior to Visit  Medication Sig Dispense Refill   aspirin EC 81 MG tablet Take 1 tablet (81 mg total) by mouth daily at 6 (six) AM. Swallow whole. 30 tablet 12   b complex vitamins capsule Take 1 capsule by mouth daily.     cholecalciferol (VITAMIN D3) 25 MCG (1000 UNIT) tablet Take 1,000 Units by mouth daily.     empagliflozin (JARDIANCE) 10 MG TABS tablet Take 5 mg by mouth daily.     ezetimibe (ZETIA) 10 MG tablet TAKE 1 TABLET EVERY DAY 90 tablet 2   furosemide (LASIX) 20 MG  tablet Take 2 tablets (40 mg total) by mouth daily. 180 tablet 3   losartan (COZAAR) 25 MG tablet Take 0.5 tablets (12.5 mg total) by mouth daily. 30 tablet 0   meloxicam (MOBIC) 15 MG tablet Take 15 mg by mouth daily.     metoprolol succinate (TOPROL XL) 25 MG 24 hr tablet Take 1 tablet (25 mg total) by mouth daily. 90 tablet 3   Multiple Vitamins-Minerals (MULTIVITAMIN WITH MINERALS) tablet Take 1 tablet by mouth daily. Centrum silver     rosuvastatin (CRESTOR) 5 MG tablet TAKE 1 TABLET EVERY OTHER DAY 45 tablet 3   warfarin (COUMADIN) 5 MG tablet Take 1 tablet daily except 1 1/2 tablets on Mondays or as directed. (Patient taking differently: Take 2.5-5 mg by mouth See admin instructions. Take 5 mg  tablet daily EXCEPT: Tuesday take 2.5 mg  in the morning or as directed.) 90 tablet 5   amoxicillin-clavulanate (AUGMENTIN) 875-125 MG tablet Take 1 tablet by mouth 2 (two) times daily. (Patient not taking: Reported on 03/23/2023) 20 tablet 0   Ascorbic Acid (VITA-C PO) Take 1,000 mg by mouth daily. (Patient not taking: Reported on 04/09/2023)     feeding supplement (ENSURE ENLIVE / ENSURE PLUS) LIQD Take 237 mLs by mouth 3 (three) times daily between meals. (Patient not taking: Reported on 04/09/2023) 237 mL 12    potassium chloride SA (KLOR-CON M) 20 MEQ tablet Take 20 mEq by mouth daily. (Patient not taking: Reported on 04/09/2023)     No current facility-administered medications on file prior to visit.        ROS:  All others reviewed and negative.  Objective        PE:  BP 120/70 (BP Location: Right Arm, Patient Position: Sitting, Cuff Size: Normal)   Pulse 64   Temp 98.6 F (37 C) (Oral)   Ht 5\' 11"  (1.803 m)   Wt 192 lb (87.1 kg)   SpO2 96%   BMI 26.78 kg/m                 Constitutional: Pt appears in NAD               HENT: Head: NCAT.                Right Ear: External ear normal.                 Left Ear: External ear normal.  Bilat tm's with mild erythema.  Max sinus areas non tender.  Pharynx with mild erythema, no exudate               Eyes: . Pupils are equal, round, and reactive to light. Conjunctivae and EOM are normal               Nose: without d/c or deformity               Neck: Neck supple. Gross normal ROM               Cardiovascular: Normal rate and regular rhythm.                 Pulmonary/Chest: Effort normal and breath sounds without rales or wheezing.                Abd:  Soft, NT, ND, + BS, no organomegaly               Neurological: Pt is alert. At baseline orientation, motor grossly intact  Skin: Skin is warm. No rashes, no other new lesions, LE edema - none               Psychiatric: Pt behavior is normal without agitation   Micro: none  Cardiac tracings I have personally interpreted today:  none  Pertinent Radiological findings (summarize): none   Lab Results  Component Value Date   WBC 5.4 02/18/2023   HGB 14.0 02/18/2023   HCT 42.9 02/18/2023   PLT 132 (L) 02/18/2023   GLUCOSE 148 (H) 02/18/2023   CHOL 109 05/21/2022   TRIG 73 05/21/2022   HDL 29 (L) 05/21/2022   LDLDIRECT 102.0 10/09/2021   LDLCALC 65 05/21/2022   ALT 18 02/18/2023   AST 19 02/18/2023   NA 135 02/18/2023   K 4.4 02/18/2023   CL 101 02/18/2023   CREATININE  1.41 (H) 02/18/2023   BUN 30 (H) 02/18/2023   CO2 27 02/18/2023   TSH 2.95 10/09/2021   PSA 1.23 10/09/2021   INR 2.7 03/15/2023   HGBA1C 5.2 05/20/2022   Assessment/Plan:  [name] [name] is a 77 y.o. White or Caucasian [1] male with  has a past medical history of AICD (automatic cardioverter/defibrillator) present, ALLERGIC RHINITIS (10/31/2007), Allergy, Cancer (HCC) (12/03/2021), CHOLECYSTECTOMY, HX OF (06/04/2007), Chronic HFrEF (heart failure with reduced ejection fraction) (HCC), COLONIC POLYPS, HX OF (10/31/2007), CORONARY ARTERY BYPASS GRAFT, HX OF (06/04/2007), CORONARY ARTERY DISEASE (06/04/2007), Diverticulosis, Erectile dysfunction, Eye twitch, History of hiatal hernia, HYPERLIPIDEMIA (06/04/2007), Hypersomnolence, HYPERTENSION (06/04/2007), Hypothyroidism, ISCHEMIC CARDIOMYOPATHY (06/04/2007), Ischemic cardiomyopathy, LV (left ventricular) mural thrombus (02/15/2019), Myocardial infarction (HCC), Osteoarthritis, Peripheral vascular disease (HCC), PLMD (periodic limb movement disorder), Pneumonia, Vertigo, and Vitamin D deficiency.  Allergic rhinitis Mild to mod, for add singulair 10 mg qd,  to f/u any worsening symptoms or concerns  Dizziness With positional vertigo likely related to allergies - for meclizine prn, also for mucinex bid prn   Laceration of right hand No s/s infection, s/p tdap in ED, now stitches removed, o/w  to f/u any worsening symptoms or concerns  Vitamin D deficiency Last vitamin D Lab Results  Component Value Date   VD25OH 51.54 10/09/2021   Stable, cont oral replacement   Essential hypertension BP Readings from Last 3 Encounters:  04/09/23 120/70  04/02/23 122/64  03/23/23 107/73   Stable, pt to continue medical treatment losartan 25 mg every day, toprol xl 25 qd  Followup: Return if symptoms worsen or fail to improve.  Oliver Barre, MD 04/10/2023 11:44 AM [facility]

## 2023-04-09 NOTE — Patient Instructions (Addendum)
Your stitches were removed  Please take all new medication as prescribed - the singulair for allergies, and meclizine as needed for vertigo  You can also take Mucinex (or it's generic off brand) for ear congestion, and tylenol as needed for pain.  Please continue all other medications as before, and refills have been done if requested.  Please have the pharmacy call with any other refills you may need.  Please continue your efforts at being more active, low cholesterol diet, and weight control.  You are otherwise up to date with prevention measures today.  Please keep your appointments with your specialists as you may have planned

## 2023-04-10 ENCOUNTER — Encounter: Payer: Self-pay | Admitting: Internal Medicine

## 2023-04-10 NOTE — Assessment & Plan Note (Signed)
Last vitamin D ?Lab Results  ?Component Value Date  ? VD25OH 51.54 10/09/2021  ? ?Stable, cont oral replacement ? ?

## 2023-04-10 NOTE — Assessment & Plan Note (Signed)
 Mild to mod, for add singulair 10 mg qd,  to f/u any worsening symptoms or concerns

## 2023-04-10 NOTE — Assessment & Plan Note (Signed)
BP Readings from Last 3 Encounters:  04/09/23 120/70  04/02/23 122/64  03/23/23 107/73   Stable, pt to continue medical treatment losartan 25 mg every day, toprol xl 25 qd

## 2023-04-10 NOTE — Assessment & Plan Note (Addendum)
With positional vertigo likely related to allergies - for meclizine prn, also for mucinex bid prn

## 2023-04-10 NOTE — Assessment & Plan Note (Signed)
No s/s infection, s/p tdap in ED, now stitches removed, o/w  to f/u any worsening symptoms or concerns

## 2023-04-13 ENCOUNTER — Ambulatory Visit (INDEPENDENT_AMBULATORY_CARE_PROVIDER_SITE_OTHER): Payer: No Typology Code available for payment source | Admitting: Physician Assistant

## 2023-04-13 VITALS — BP 120/65 | HR 59 | Temp 98.4°F | Resp 18 | Ht 71.0 in | Wt 192.0 lb

## 2023-04-13 DIAGNOSIS — I739 Peripheral vascular disease, unspecified: Secondary | ICD-10-CM

## 2023-04-13 DIAGNOSIS — I724 Aneurysm of artery of lower extremity: Secondary | ICD-10-CM | POA: Diagnosis not present

## 2023-04-13 DIAGNOSIS — T8149XA Infection following a procedure, other surgical site, initial encounter: Secondary | ICD-10-CM | POA: Diagnosis not present

## 2023-04-13 NOTE — Progress Notes (Signed)
POST OPERATIVE OFFICE NOTE    CC:  F/u for surgery  HPI:  This is a 77 y.o. male who is s/p right above knee to TP trunk bypass with GSV on 05/20/22 by Dr. Chestine Spore. He post operatively developed a seroma that required I&D on 01/08/23 with recurrence. A second I&D was performed by Dr. Chestine Spore in the office on 03/09/23. He has been packing with wound with wet to dry dressings.  Pt returns today for follow up with his wife. Pt states the wound is almost fully healed. He has no longer been packing it as the wound is too small now. He has been cleaning it with wound cleaner. He denies any drainage. Otherwise he is not having any pain. He has been experiencing swelling in the right lower leg from knee down to foot. He does elevate in recliner with some improvement. He does not tolerate compression stockings. He denies rest pain, claudication, tissue loss bilateral lower extremities. He is on a daily aspirin. He is also on Coumadin. He and his wife are traveling to [place] next week for vacation.  Allergies  Allergen Reactions   Ace Inhibitors Cough   Statins Other (See Comments)    Muscle pains    Current Outpatient Medications  Medication Sig Dispense Refill   Ascorbic Acid (VITA-C PO) Take 1,000 mg by mouth daily.     aspirin EC 81 MG tablet Take 1 tablet (81 mg total) by mouth daily at 6 (six) AM. Swallow whole. 30 tablet 12   b complex vitamins capsule Take 1 capsule by mouth daily.     cholecalciferol (VITAMIN D3) 25 MCG (1000 UNIT) tablet Take 1,000 Units by mouth daily.     empagliflozin (JARDIANCE) 10 MG TABS tablet Take 5 mg by mouth daily.     ezetimibe (ZETIA) 10 MG tablet TAKE 1 TABLET EVERY DAY 90 tablet 2   furosemide (LASIX) 20 MG tablet Take 2 tablets (40 mg total) by mouth daily. 180 tablet 3   guaiFENesin (MUCINEX) 600 MG 12 hr tablet Take 2 tablets (1,200 mg total) by mouth 2 (two) times daily as needed. 60 tablet 2   losartan (COZAAR) 25 MG tablet Take 0.5 tablets (12.5 mg total)  by mouth daily. 30 tablet 0   meclizine (ANTIVERT) 12.5 MG tablet Take 1 tablet (12.5 mg total) by mouth 3 (three) times daily as needed for dizziness. 40 tablet 1   meloxicam (MOBIC) 15 MG tablet Take 15 mg by mouth daily.     metoprolol succinate (TOPROL XL) 25 MG 24 hr tablet Take 1 tablet (25 mg total) by mouth daily. 90 tablet 3   montelukast (SINGULAIR) 10 MG tablet Take 1 tablet (10 mg total) by mouth at bedtime. 30 tablet 11   Multiple Vitamins-Minerals (MULTIVITAMIN WITH MINERALS) tablet Take 1 tablet by mouth daily. Centrum silver     rosuvastatin (CRESTOR) 5 MG tablet TAKE 1 TABLET EVERY OTHER DAY 45 tablet 3   warfarin (COUMADIN) 5 MG tablet Take 1 tablet daily except 1 1/2 tablets on Mondays or as directed. (Patient taking differently: Take 2.5-5 mg by mouth See admin instructions. Take 5 mg  tablet daily EXCEPT: Tuesday take 2.5 mg  in the morning or as directed.) 90 tablet 5   amoxicillin-clavulanate (AUGMENTIN) 875-125 MG tablet Take 1 tablet by mouth 2 (two) times daily. (Patient not taking: Reported on 03/23/2023) 20 tablet 0   feeding supplement (ENSURE ENLIVE / ENSURE PLUS) LIQD Take 237 mLs by mouth 3 (three) times  daily between meals. (Patient not taking: Reported on 04/09/2023) 237 mL 12   potassium chloride SA (KLOR-CON M) 20 MEQ tablet Take 20 mEq by mouth daily. (Patient not taking: Reported on 04/09/2023)     No current facility-administered medications for this visit.     ROS:  See HPI  Physical Exam:  Vitals:   04/13/23 1110  BP: 120/65  Pulse: (!) 59  Resp: 18  Temp: 98.4 F (36.9 C)  SpO2: 94%    Incision:  right medial distal thigh, small 1 cm opening. No drainage appreciable. No surrounding erythema. No appreciable fluid collection.   Extremities:  well perfused and warm Neuro: alert and oriented   Assessment/Plan:  This is a 77 y.o. male who is s/p:s/p right above knee to TP trunk bypass with GSV on 05/20/22 by Dr. Chestine Spore. He post operatively developed  a seroma that required I&D on 01/08/23 with recurrence. A second I&D was performed by Dr. Chestine Spore in the office on 03/09/23. Wound is almost completely healed now. Does not need to pack wound. Okay to leave open to air or keep small bandage over it as needed - Encourage daily cleaning of wound with mild soap and water or wound cleaner - Continue Aspirin, Statin -He is scheduled for 6 month follow up on 07/20/23 with non invasive studies. He will keep this appointment with Dr. Chestine Spore. He knows to call for earlier follow up if any new or concerning symptoms   Nathanial Rancher [facility] [phone]   Clinic MD:  Chestine Spore

## 2023-04-14 ENCOUNTER — Ambulatory Visit: Payer: Medicare HMO | Attending: Cardiology | Admitting: *Deleted

## 2023-04-14 DIAGNOSIS — I513 Intracardiac thrombosis, not elsewhere classified: Secondary | ICD-10-CM | POA: Diagnosis not present

## 2023-04-14 DIAGNOSIS — Z5181 Encounter for therapeutic drug level monitoring: Secondary | ICD-10-CM | POA: Diagnosis not present

## 2023-04-14 LAB — POCT INR: INR: 2.9 (ref 2.0–3.0)

## 2023-04-14 NOTE — Patient Instructions (Signed)
Finished chemo Continue 1 tablet daily except 1/2 tablet on Tuesdays Continue greens Recheck in 4 wks

## 2023-04-15 ENCOUNTER — Telehealth: Payer: Self-pay

## 2023-04-15 DIAGNOSIS — Z7901 Long term (current) use of anticoagulants: Secondary | ICD-10-CM | POA: Diagnosis not present

## 2023-04-15 DIAGNOSIS — I10 Essential (primary) hypertension: Secondary | ICD-10-CM | POA: Diagnosis not present

## 2023-04-15 DIAGNOSIS — I97648 Postprocedural seroma of a circulatory system organ or structure following other circulatory system procedure: Secondary | ICD-10-CM | POA: Diagnosis not present

## 2023-04-15 NOTE — Telephone Encounter (Signed)
Attempted call to patient.  Advised the manual remote transmission was not received and to contact [employer] support number at [phone] for assistance.

## 2023-04-15 NOTE — Telephone Encounter (Signed)
Spoke with patient and advised remote transmission was not received.  Attempted to send manual transmission again during conversation.  Monitor showed 3230 error code.  Advised to call [employer] tech support number for assistance.  ICM remote transmission rescheduled for 10/7.  Pt will be on vacation starting 9/10.

## 2023-04-19 DIAGNOSIS — Z7901 Long term (current) use of anticoagulants: Secondary | ICD-10-CM | POA: Diagnosis not present

## 2023-04-19 DIAGNOSIS — I97648 Postprocedural seroma of a circulatory system organ or structure following other circulatory system procedure: Secondary | ICD-10-CM | POA: Diagnosis not present

## 2023-04-19 DIAGNOSIS — I10 Essential (primary) hypertension: Secondary | ICD-10-CM | POA: Diagnosis not present

## 2023-04-19 NOTE — Progress Notes (Signed)
No ICM remote transmission received for 04/13/2023 due to waiting on monitor replacement and next ICM transmission scheduled for 05/05/2023.

## 2023-04-20 ENCOUNTER — Ambulatory Visit: Payer: Medicare HMO | Attending: Cardiovascular Disease

## 2023-04-20 DIAGNOSIS — Z95810 Presence of automatic (implantable) cardiac defibrillator: Secondary | ICD-10-CM

## 2023-04-20 DIAGNOSIS — I5022 Chronic systolic (congestive) heart failure: Secondary | ICD-10-CM

## 2023-04-20 NOTE — Progress Notes (Signed)
EPIC Encounter for ICM Monitoring  Patient Name: [name] [name] is a 77 y.o. male Date: 04/20/2023 Primary Care Physican: Corwin Levins, MD Primary Cardiologist: Jens Som Electrophysiologist: Mealor 04/20/2022 Not weighing at home 05/07/2022 Weight: 181 lbs (does not weigh consistently at home) 09/29/2022 Office Weight: 190 lbs 11/17/2022 Weight: 185 lbs 04/05/2023 Office Weight: 192 lbs                                                            Transmission results reviewed.  Pt leaves for vacation 9/10.    DIET:  Pt uses salt shaker at home and does not follow low salt diet.  Also eats restaurant foods.     Optivol thoracic impedance suggesting fluid levels trending back close to baseline normal.      Prescribed: Furosemide 20 mg take 2 tablet(s) (40 mg total) by mouth daily   Jardiance 10 mg take 1 tablet by mouth daily   Labs: 01/08/2023 Creatinine 1.37, BUN 31, Potassium 3.8, Sodium 137, GFR 53 11/23/2022 Creatinine 1.68, BUN 42, Potassium 3.9, Sodium 135, GFR 42 11/12/2022 Creatinine 1.21, BUN 28, Potassium 3.8, Sodium 137, GFR >60 09/25/2022 Creatinine 1.37, BUN 30, Potassium 3.8, Sodium 3.8, GFR 53 08/13/2022 Creatinine 1.04, BUN 21, Potassium 3.5, Sodium 138  A complete set of results can be found in Results Review.   Recommendations:   No changes.     Follow-up plan: ICM clinic phone appointment on 05/24/2023.   91 day device clinic remote transmission 04/27/2023.     EP/Cardiology Office Visits:   Recall 08/23/2023 with Dr Jens Som.  Recall 07/25/2023 with Dr Nelly Laurence.    Copy of ICM check sent to Dr. Nelly Laurence.    3 month ICM trend: 04/20/2023.    12-14 Month ICM trend:     Karie Soda, RN 04/20/2023 7:43 AM

## 2023-05-04 ENCOUNTER — Encounter: Payer: Self-pay | Admitting: Hematology

## 2023-05-05 ENCOUNTER — Ambulatory Visit (HOSPITAL_COMMUNITY)
Admission: RE | Admit: 2023-05-05 | Discharge: 2023-05-05 | Disposition: A | Discharge status: Home / Self Care | Payer: No Typology Code available for payment source | Source: Ambulatory Visit | Attending: Nephrology | Admitting: Nephrology

## 2023-05-05 ENCOUNTER — Other Ambulatory Visit: Payer: Self-pay | Admitting: Cardiology

## 2023-05-05 DIAGNOSIS — N1831 Chronic kidney disease, stage 3a: Secondary | ICD-10-CM | POA: Diagnosis present

## 2023-05-12 ENCOUNTER — Ambulatory Visit: Payer: Medicare HMO | Attending: Cardiology | Admitting: *Deleted

## 2023-05-12 DIAGNOSIS — Z5181 Encounter for therapeutic drug level monitoring: Secondary | ICD-10-CM

## 2023-05-12 DIAGNOSIS — I513 Intracardiac thrombosis, not elsewhere classified: Secondary | ICD-10-CM

## 2023-05-12 LAB — POCT INR: INR: 2.8 (ref 2.0–3.0)

## 2023-05-12 NOTE — Patient Instructions (Signed)
Finished chemo Continue 1 tablet daily except 1/2 tablet on Tuesdays Continue greens Recheck in 4 wks

## 2023-05-13 IMAGING — CT CT ABD-PELV W/ CM
2 of 5 series · 15 of 46 positions shown, 17 images · IV contrast (Omnipaque or Isovue)
Comparison: None.

CLINICAL DATA: Unintentional weight loss. Family history of
pancreatic cancer. No personal history of malignancy.

EXAM:
CT ABDOMEN AND PELVIS WITH CONTRAST
TECHNIQUE: Multidetector CT imaging of the abdomen and pelvis was performed
using the standard protocol following bolus administration of
intravenous contrast.

[Series 2: axial st · axial · 0.78mm/px · z∈[+716,+1136]mm · 12 of 96 slices shown, 14 images]
[im 6/96  soft-tissue]
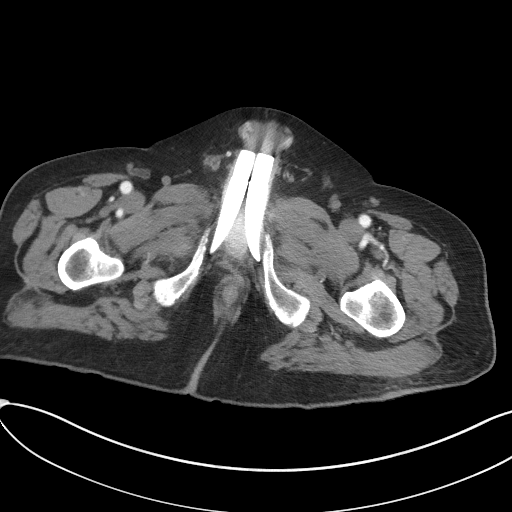
[im 6/96  bone]
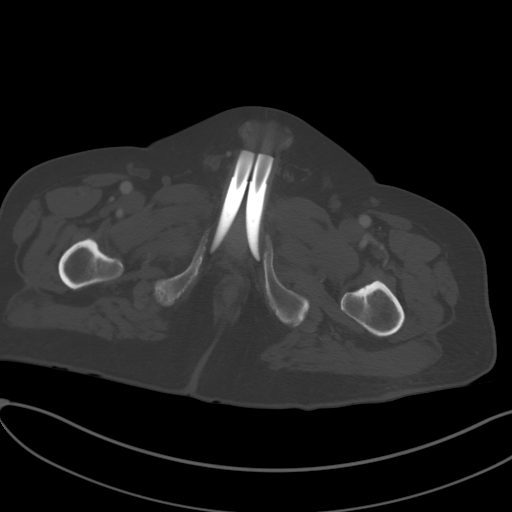
[im 16/96  soft-tissue]
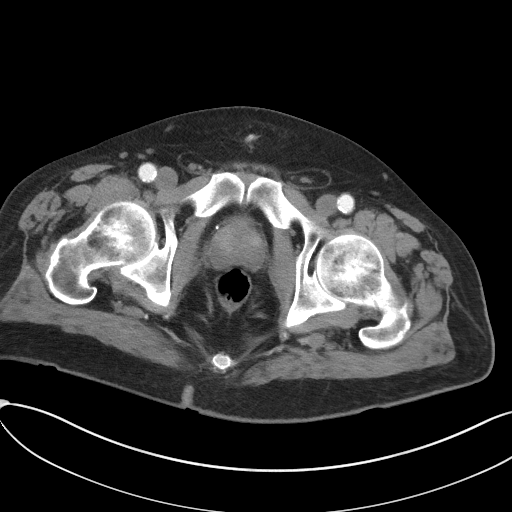
[im 22/96  soft-tissue]
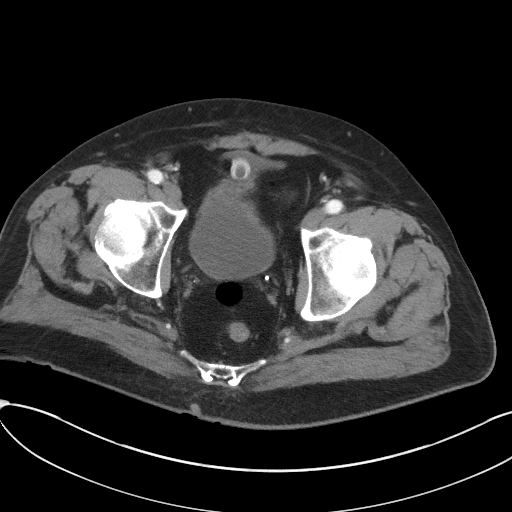
[im 27/96  soft-tissue]
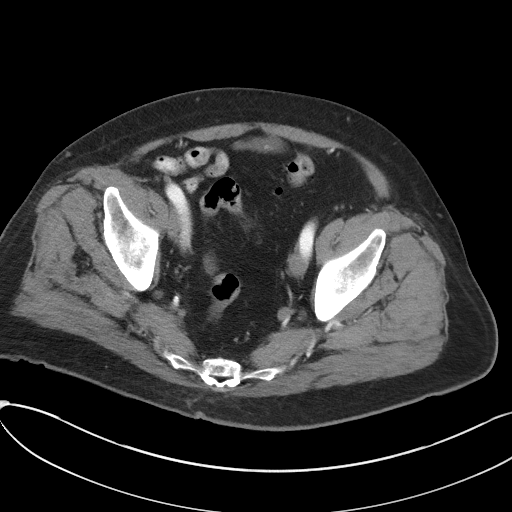
[im 37/96  soft-tissue]
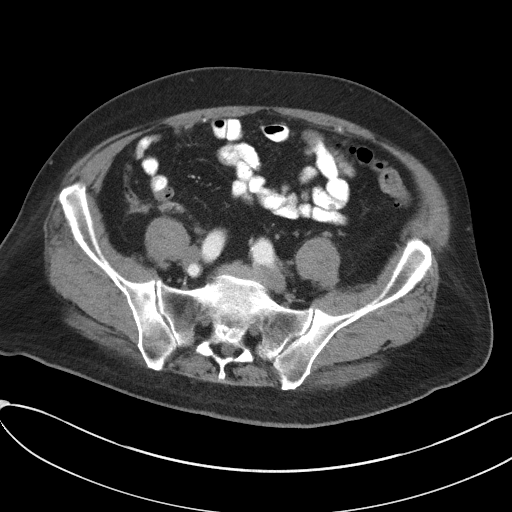
[im 43/96  soft-tissue]
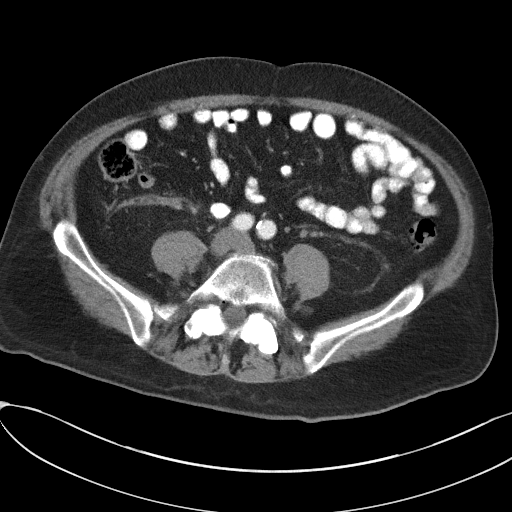
[im 53/96  soft-tissue]
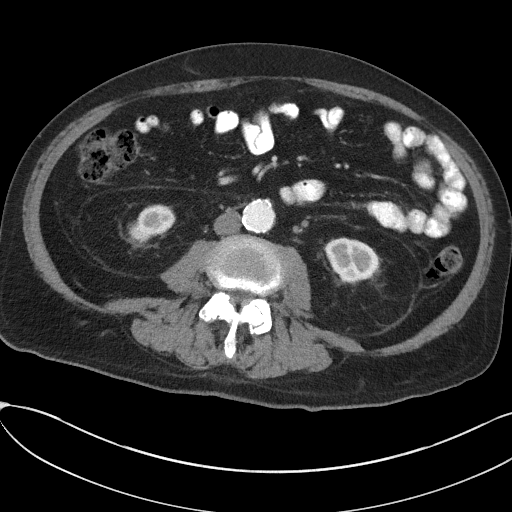
[im 59/96  soft-tissue]
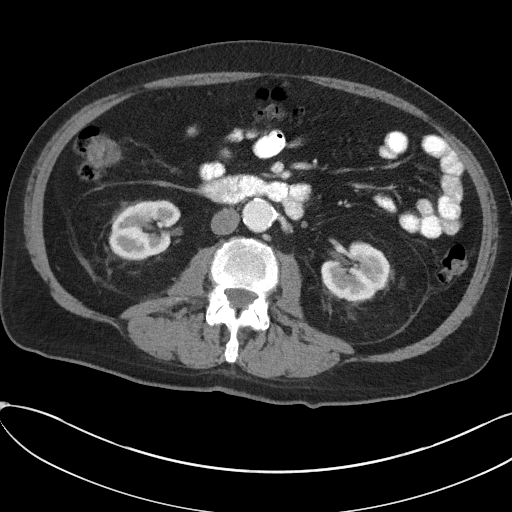
[im 69/96  soft-tissue]
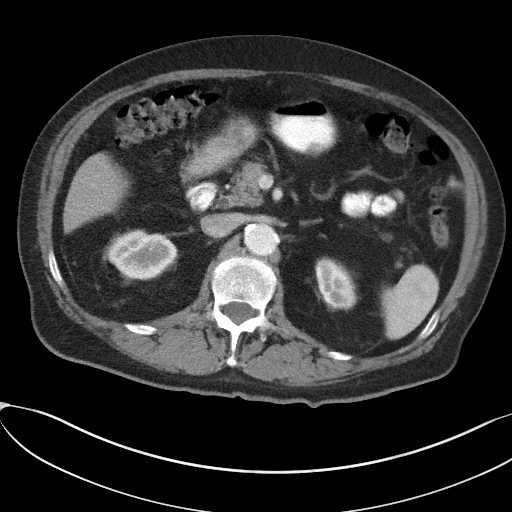
[im 69/96  bone]
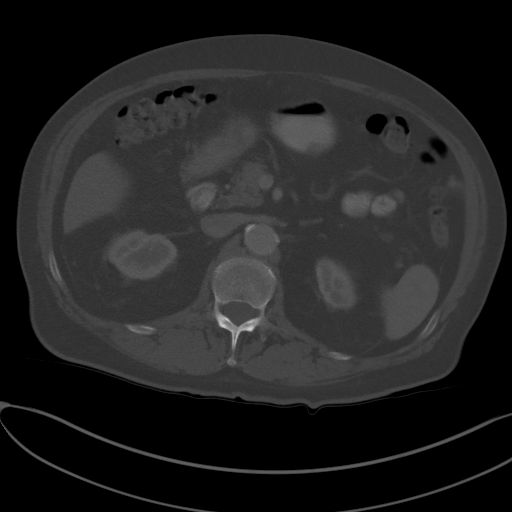
[im 74/96  soft-tissue]
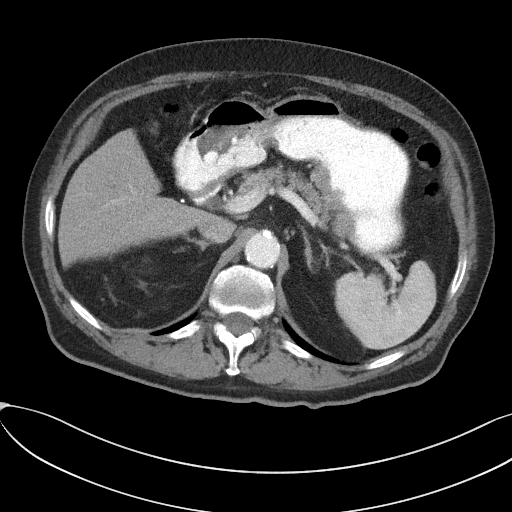
[im 80/96  soft-tissue]
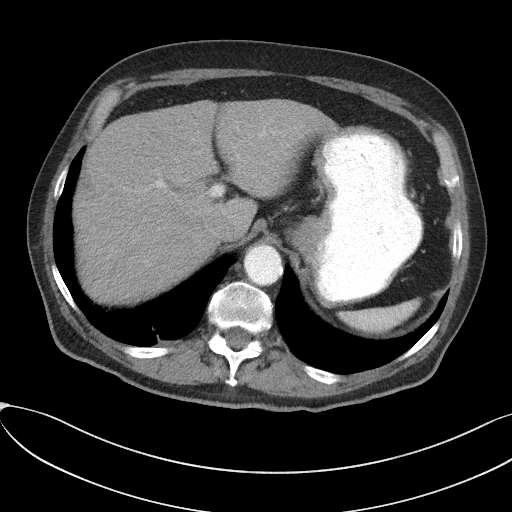
[im 90/96  soft-tissue]
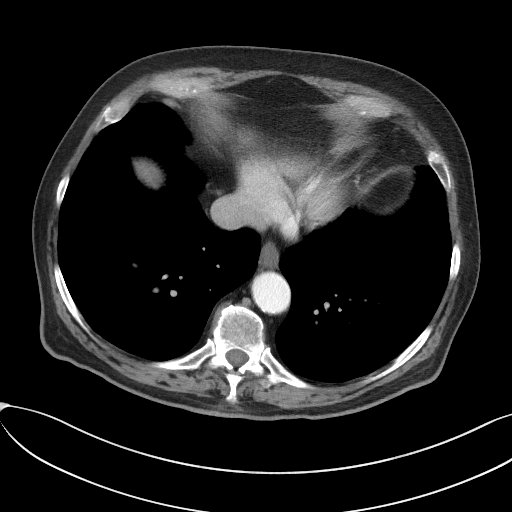

[Series 5: coronal st · coronal · 0.82mm/px · 3 of 116 slices shown]
[im 39/116  soft-tissue]
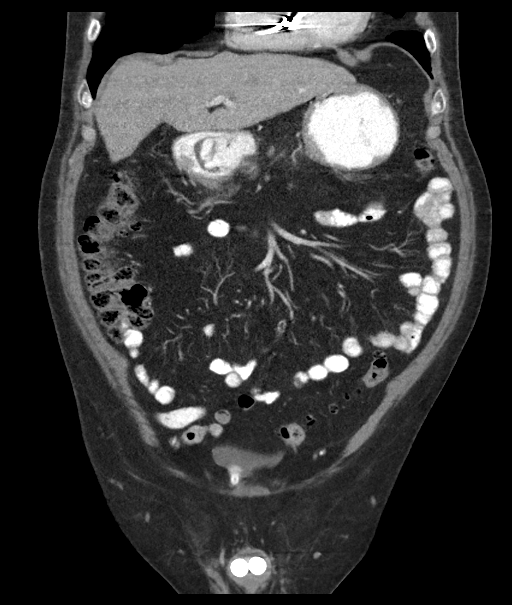
[im 52/116  soft-tissue]
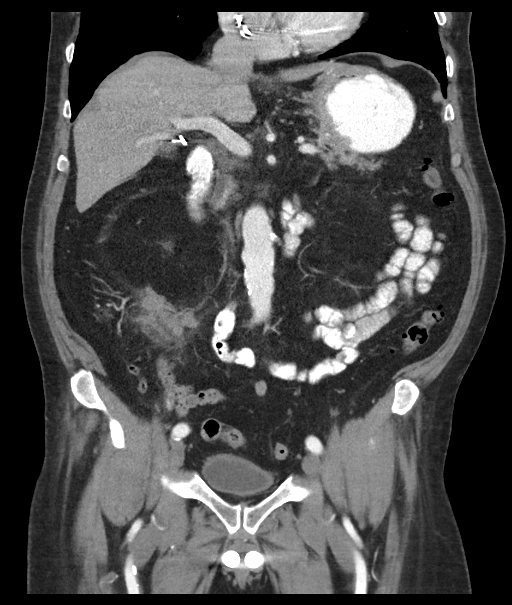
[im 64/116  soft-tissue]
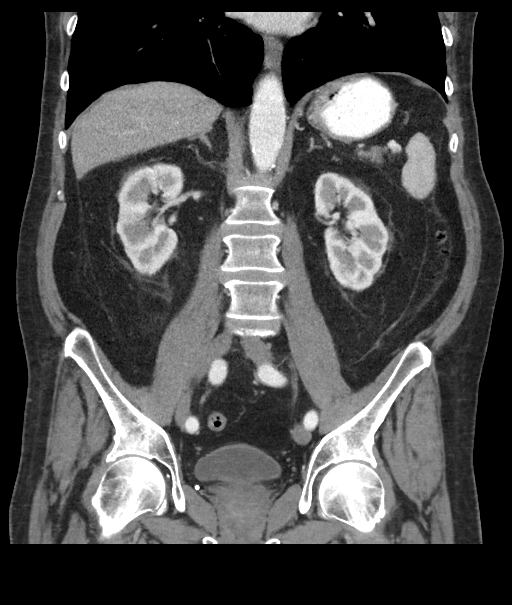

[15 of 46 positions shown; findings below may reference images not displayed]

RADIATION DOSE REDUCTION: This exam was performed according to the
departmental dose-optimization program which includes automated
exposure control, adjustment of the mA and/or kV according to
patient size and/or use of iterative reconstruction technique.

CONTRAST:  100mL OMNIPAQUE IOHEXOL 300 MG/ML  SOLN
FINDINGS: Lower chest: Mild emphysematous changes and scarring at both lung
bases. No significant pleural or pericardial effusion. AICD leads
are noted.

Hepatobiliary: The liver is normal in density without suspicious
focal abnormality. No significant biliary dilatation status post
cholecystectomy.

Pancreas: Unremarkable. No pancreatic ductal dilatation or
surrounding inflammatory changes. No evidence of pancreatic mass.

Spleen: Normal in size without focal abnormality.

Adrenals/Urinary Tract: Both adrenal glands appear normal. The
kidneys appear normal without evidence of urinary tract calculus,
suspicious lesion or hydronephrosis. No bladder abnormalities are
seen.

Stomach/Bowel: Enteric contrast was administered and has passed into
the distal small bowel. There is irregular nodular wall thickening
of the gastric cardia extending along the lesser curvature of the
stomach. This is more than expected for the degree of gastric
distension and could reflect an infiltrating mass. The small bowel,
appendix and proximal colon appear normal. There are diverticular
changes of the descending and sigmoid colon. No evidence of bowel
malignancy.

Vascular/Lymphatic: There are no enlarged abdominal or pelvic lymph
nodes. Aortic and branch vessel atherosclerosis without aneurysm or
acute vascular findings. The portal, superior mesenteric and splenic
veins are patent.

Reproductive: Minimal heterogeneity of the prostate gland. The
seminal vesicles appear normal. Partially imaged penile prosthesis.
Suprapubic reservoir appears deflated.

Other: There is irregular nodular thickening of the anterior
pararenal fascia on the right, measuring up to 6.5 x 1.6 cm
transverse on image 52/2. The left perirenal fascia is mildly
thickened without nodularity. There is no ascites or peritoneal
nodularity.

Musculoskeletal: No acute or significant osseous findings.
Multilevel lumbar spondylosis.
IMPRESSION: 1. Abnormal nodular thickening of the right anterior pararenal
fascia, suspicious for atypical malignancy such as lymphoma, sarcoma
or metastatic disease. Consider tissue sampling. This should be
amenable to biopsy under CT guidance.
2. No evidence of pancreatic malignancy. There is irregular wall
thickening of the proximal stomach which could reflect infiltrative
malignancy. This would be best evaluated with endoscopy.
3. No ascites or peritoneal nodularity.
4. Distal colonic diverticulosis.
5.  Aortic Atherosclerosis ([id number].H).
6. These results will be called to the ordering clinician or
representative by the Radiologist Assistant, and communication
documented in the PACS or [REDACTED].

## 2023-05-18 ENCOUNTER — Ambulatory Visit: Payer: Medicare HMO | Admitting: Internal Medicine

## 2023-05-18 ENCOUNTER — Encounter: Payer: Self-pay | Admitting: Internal Medicine

## 2023-05-18 ENCOUNTER — Ambulatory Visit (INDEPENDENT_AMBULATORY_CARE_PROVIDER_SITE_OTHER): Payer: Medicare HMO

## 2023-05-18 VITALS — BP 120/72 | HR 50 | Temp 98.5°F | Ht 71.0 in | Wt 190.0 lb

## 2023-05-18 DIAGNOSIS — R0602 Shortness of breath: Secondary | ICD-10-CM | POA: Diagnosis not present

## 2023-05-18 DIAGNOSIS — I10 Essential (primary) hypertension: Secondary | ICD-10-CM

## 2023-05-18 DIAGNOSIS — K0253 Dental caries on pit and fissure surface penetrating into pulp: Secondary | ICD-10-CM | POA: Insufficient documentation

## 2023-05-18 DIAGNOSIS — K08131 Complete loss of teeth due to caries, class I: Secondary | ICD-10-CM | POA: Insufficient documentation

## 2023-05-18 DIAGNOSIS — E559 Vitamin D deficiency, unspecified: Secondary | ICD-10-CM

## 2023-05-18 DIAGNOSIS — Z23 Encounter for immunization: Secondary | ICD-10-CM

## 2023-05-18 DIAGNOSIS — J449 Chronic obstructive pulmonary disease, unspecified: Secondary | ICD-10-CM | POA: Diagnosis not present

## 2023-05-18 DIAGNOSIS — R051 Acute cough: Secondary | ICD-10-CM

## 2023-05-18 DIAGNOSIS — R739 Hyperglycemia, unspecified: Secondary | ICD-10-CM | POA: Diagnosis not present

## 2023-05-18 DIAGNOSIS — E7800 Pure hypercholesterolemia, unspecified: Secondary | ICD-10-CM

## 2023-05-18 DIAGNOSIS — R059 Cough, unspecified: Secondary | ICD-10-CM | POA: Insufficient documentation

## 2023-05-18 DIAGNOSIS — K08432 Partial loss of teeth due to caries, class II: Secondary | ICD-10-CM | POA: Insufficient documentation

## 2023-05-18 DIAGNOSIS — E538 Deficiency of other specified B group vitamins: Secondary | ICD-10-CM

## 2023-05-18 DIAGNOSIS — R918 Other nonspecific abnormal finding of lung field: Secondary | ICD-10-CM | POA: Diagnosis not present

## 2023-05-18 LAB — URINALYSIS, ROUTINE W REFLEX MICROSCOPIC
Bilirubin Urine: NEGATIVE
Hgb urine dipstick: NEGATIVE
Ketones, ur: NEGATIVE
Leukocytes,Ua: NEGATIVE
Nitrite: NEGATIVE
RBC / HPF: NONE SEEN (ref 0–?)
Specific Gravity, Urine: 1.015 (ref 1.000–1.030)
Total Protein, Urine: NEGATIVE
Urine Glucose: >=1000 — AB
Urobilinogen, UA: 0.2 (ref 0.0–1.0)
WBC, UA: NONE SEEN (ref 0–?)
pH: 6 (ref 5.0–8.0)

## 2023-05-18 LAB — HEPATIC FUNCTION PANEL
ALT: 22 U/L (ref 0–53)
AST: 22 U/L (ref 0–37)
Albumin: 3.9 g/dL (ref 3.5–5.2)
Alkaline Phosphatase: 79 U/L (ref 39–117)
Bilirubin, Direct: 0.1 mg/dL (ref 0.0–0.3)
Total Bilirubin: 0.5 mg/dL (ref 0.2–1.2)
Total Protein: 6.6 g/dL (ref 6.0–8.3)

## 2023-05-18 LAB — LIPID PANEL
Cholesterol: 142 mg/dL (ref 0–200)
HDL: 38.2 mg/dL — ABNORMAL LOW (ref >39.00)
LDL Cholesterol: 47 mg/dL (ref 0–99)
NonHDL: 103.68
Total CHOL/HDL Ratio: 4
Triglycerides: 285 mg/dL — ABNORMAL HIGH (ref 0.0–149.0)
VLDL: 57 mg/dL — ABNORMAL HIGH (ref 0.0–40.0)

## 2023-05-18 LAB — CBC WITH DIFFERENTIAL/PLATELET
Basophils Absolute: 0.1 10*3/uL (ref 0.0–0.1)
Basophils Relative: 1 % (ref 0.0–3.0)
Eosinophils Absolute: 0.4 10*3/uL (ref 0.0–0.7)
Eosinophils Relative: 5.4 % — ABNORMAL HIGH (ref 0.0–5.0)
HCT: 42.6 % (ref 39.0–52.0)
Hemoglobin: 13.7 g/dL (ref 13.0–17.0)
Lymphocytes Relative: 26.6 % (ref 12.0–46.0)
Lymphs Abs: 1.8 10*3/uL (ref 0.7–4.0)
MCHC: 32.2 g/dL (ref 30.0–36.0)
MCV: 95.1 fL (ref 78.0–100.0)
Monocytes Absolute: 0.7 10*3/uL (ref 0.1–1.0)
Monocytes Relative: 9.5 % (ref 3.0–12.0)
Neutro Abs: 4 10*3/uL (ref 1.4–7.7)
Neutrophils Relative %: 57.5 % (ref 43.0–77.0)
Platelets: 258 10*3/uL (ref 150.0–400.0)
RBC: 4.48 Mil/uL (ref 4.22–5.81)
RDW: 14.7 % (ref 11.5–15.5)
WBC: 6.9 10*3/uL (ref 4.0–10.5)

## 2023-05-18 LAB — HEMOGLOBIN A1C: Hgb A1c MFr Bld: 5.8 % (ref 4.6–6.5)

## 2023-05-18 LAB — BASIC METABOLIC PANEL
BUN: 23 mg/dL (ref 6–23)
CO2: 31 meq/L (ref 19–32)
Calcium: 9.5 mg/dL (ref 8.4–10.5)
Chloride: 101 meq/L (ref 96–112)
Creatinine, Ser: 1.3 mg/dL (ref 0.40–1.50)
GFR: 52.76 mL/min — ABNORMAL LOW (ref >60.00)
Glucose, Bld: 104 mg/dL — ABNORMAL HIGH (ref 70–99)
Potassium: 4.2 meq/L (ref 3.5–5.1)
Sodium: 140 meq/L (ref 135–145)

## 2023-05-18 LAB — TSH: TSH: 1.77 u[IU]/mL (ref 0.35–5.50)

## 2023-05-18 LAB — VITAMIN D 25 HYDROXY (VIT D DEFICIENCY, FRACTURES): VITD: 73.44 ng/mL (ref 30.00–100.00)

## 2023-05-18 LAB — VITAMIN B12: Vitamin B-12: 726 pg/mL (ref 211–911)

## 2023-05-18 MED ORDER — AMOXICILLIN-POT CLAVULANATE 875-125 MG PO TABS: 1.0000 | ORAL_TABLET | Freq: Two times a day (BID) | ORAL | 0 refills | Status: DC

## 2023-05-18 MED ORDER — HYDROCODONE BIT-HOMATROP MBR 5-1.5 MG/5ML PO SOLN: 5.0000 mL | Freq: Four times a day (QID) | ORAL | 0 refills | Status: AC | PRN

## 2023-05-18 NOTE — Progress Notes (Unsigned)
Patient ID: [name] [name], male   DOB: 01/28/1946, 77 y.o.   MRN: [id number]        Chief Complaint: follow up HTN, HLD and hyperglycemia , low vit d, cough       HPI:  [name] [name] is a 77 y.o. male Here with acute onset mild to mod 2-3 days ST, HA, general weakness and malaise, with prod cough greenish sputum with mild sob, wheeziness,general weakness, but Pt denies, orthopnea, PND, increased LE swelling, palpitations, dizziness or syncope., but does have fatigue, feels ill, weak overall up to 1 month forunclear reason.  Lost 5 lbs recently with lower appetite.  No N/V but with cough, sob, wheezing, as above.  Denies urinary symptoms such as dysuria, frequency, urgency, flank pain, hematuria or n/v, fever, chills.   Pt denies polydipsia, polyuria, or new focal neuro s/s.    Wt Readings from Last 3 Encounters:  05/18/23 190 lb (86.2 kg)  04/13/23 192 lb (87.1 kg)  04/09/23 192 lb (87.1 kg)   BP Readings from Last 3 Encounters:  05/18/23 120/72  04/13/23 120/65  04/09/23 120/70         Past Medical History:  Diagnosis Date   AICD (automatic cardioverter/defibrillator) present    ALLERGIC RHINITIS 10/31/2007   Qualifier: Diagnosis of  By: Jonny Ruiz MD, Len Blalock    Allergy    Cancer (HCC) 12/03/2021   non-Hodgkin's B-cell lymphoma   CHOLECYSTECTOMY, HX OF 06/04/2007   Qualifier: Diagnosis of  By: Genelle Gather CMA, [place]     Chronic HFrEF (heart failure with reduced ejection fraction) (HCC)    COLONIC POLYPS, HX OF 10/31/2007   Qualifier: Diagnosis of  By: Jonny Ruiz MD, Len Blalock    CORONARY ARTERY BYPASS GRAFT, HX OF 06/04/2007   Qualifier: Diagnosis of  By: Genelle Gather CMA, [place]     CORONARY ARTERY DISEASE 06/04/2007   Qualifier: Diagnosis of  By: Genelle Gather CMA, [place]     Diverticulosis    Erectile dysfunction    Eye twitch    right eye since chilhood    History of hiatal hernia    HYPERLIPIDEMIA 06/04/2007   Qualifier: Diagnosis of  By: Genelle Gather CMA, [place]     Hypersomnolence     HYPERTENSION 06/04/2007   Qualifier: Diagnosis of  By: Genelle Gather CMA, [place]     Hypothyroidism    pt does not take medications and doesn't know if he has it   ISCHEMIC CARDIOMYOPATHY 06/04/2007   Qualifier: Diagnosis of  By: Genelle Gather CMA, [place]     Ischemic cardiomyopathy    LV (left ventricular) mural thrombus 02/15/2019   Myocardial infarction [facility])    per patient , his occurred in 1998    Osteoarthritis    Peripheral vascular disease (HCC)    PLMD (periodic limb movement disorder)    Pneumonia    Vertigo    Vitamin D deficiency    Past Surgical History:  Procedure Laterality Date   BIOPSY  11/25/2021   Procedure: BIOPSY;  Surgeon: Lynann Bologna, MD;  Location: [facility];  Service: Gastroenterology;;   CABG  1999   [facility] [place], [place].  Patient reports that a small vein was taken from under his arm and a small incesion was made.   CHOLECYSTECTOMY     COLONOSCOPY     COLONOSCOPY WITH PROPOFOL N/A 11/25/2021   Procedure: COLONOSCOPY WITH PROPOFOL;  Surgeon: Lynann Bologna, MD;  Location: [facility];  Service: Gastroenterology;  Laterality: N/A;  ENDOVASCULAR REPAIR OF POPLITEAL ARTERY ANEURYSM Right 05/20/2022   Procedure: LIGATION OF POPLITEAL ARTERY ANEURYSM;  Surgeon: Cephus Shelling, MD;  Location: [facility] OR;  Service: Vascular;  Laterality: Right;   ESOPHAGOGASTRODUODENOSCOPY (EGD) WITH PROPOFOL N/A 11/25/2021   Procedure: ESOPHAGOGASTRODUODENOSCOPY (EGD) WITH PROPOFOL;  Surgeon: Lynann Bologna, MD;  Location: [facility];  Service: Gastroenterology;  Laterality: N/A;   FEMORAL-POPLITEAL BYPASS GRAFT Right 05/20/2022   Procedure: RIGHT ABOVE KNEE POPLITEAL ARTERY TO TIBIOPERONEAL TRUNK/POSTERIOR TIBIAL ARTERY BYPASS;  Surgeon: Cephus Shelling, MD;  Location: [facility] OR;  Service: Vascular;  Laterality: Right;   ICD IMPLANT N/A 01/30/2020   Procedure: ICD IMPLANT;  Surgeon: Hillis Range, MD;  Location: [facility] INVASIVE CV LAB;  Service:  Cardiovascular;  Laterality: N/A;   INCISION AND DRAINAGE Right 01/08/2023   Procedure: INCISION AND DRAINAGE OF RIGHT LEG  SEROMA, DRAIN PLACEMENT AND MYRIAD MORCELLS;  Surgeon: Cephus Shelling, MD;  Location: [facility] OR;  Service: Vascular;  Laterality: Right;   IR IMAGING GUIDED PORT INSERTION  12/04/2021   KNEE ARTHROPLASTY Left 08/05/2017   Procedure: LEFT TOTAL KNEE ARTHROPLASTY WITH COMPUTER NAVIGATION;  Surgeon: Samson Frederic, MD;  Location: [facility] ORS;  Service: Orthopedics;  Laterality: Left;  Needs RNFA   LIGATION OF CILIAC ARTERY Right 05/20/2022   Procedure: LIGATION OF RIGHT ANTERIOR TIBIAL ARTERY;  Surgeon: Cephus Shelling, MD;  Location: [facility] OR;  Service: Vascular;  Laterality: Right;   LOWER EXTREMITY ANGIOGRAPHY Right 05/14/2022   Procedure: Lower Extremity Angiography;  Surgeon: Cephus Shelling, MD;  Location: [facility] INVASIVE CV LAB;  Service: Cardiovascular;  Laterality: Right;   PENILE PROSTHESIS IMPLANT     POLYPECTOMY  11/25/2021   Procedure: POLYPECTOMY;  Surgeon: Lynann Bologna, MD;  Location: [facility];  Service: Gastroenterology;;   REPLACEMENT TOTAL KNEE  06/12/2013   spinal cyst     THORACOTOMY     left anterior; wound exploration and debridement   TONSILLECTOMY AND ADENOIDECTOMY     age 48   VEIN HARVEST Right 05/20/2022   Procedure: RIGHT GREATER SAPHENOUS VEIN HARVEST;  Surgeon: Cephus Shelling, MD;  Location: [facility] OR;  Service: Vascular;  Laterality: Right;    reports that he quit smoking about 42 years ago. His smoking use included cigarettes. He started smoking about 59 years ago. He has a 68 pack-year smoking history. He has been exposed to tobacco smoke. He has never used smokeless tobacco. He reports current alcohol use of about 2.0 standard drinks of alcohol per week. He reports that he does not use drugs. family history includes COPD in his son; Colon cancer in his maternal grandfather; Heart disease in his brother; Lung cancer in his father;  Pancreatic cancer in his brother, cousin, and maternal uncle; Stomach cancer in his mother. Allergies  Allergen Reactions   Ace Inhibitors Cough   Statins Other (See Comments)    Muscle pains   Current Outpatient Medications on File Prior to Visit  Medication Sig Dispense Refill   Ascorbic Acid (VITA-C PO) Take 1,000 mg by mouth daily.     aspirin EC 81 MG tablet Take 1 tablet (81 mg total) by mouth daily at 6 (six) AM. Swallow whole. 30 tablet 12   b complex vitamins capsule Take 1 capsule by mouth daily.     cholecalciferol (VITAMIN D3) 25 MCG (1000 UNIT) tablet Take 1,000 Units by mouth daily.     empagliflozin (JARDIANCE) 10 MG TABS tablet Take 5 mg by mouth daily.     ezetimibe (ZETIA) 10  MG tablet TAKE 1 TABLET EVERY DAY 90 tablet 2   feeding supplement (ENSURE ENLIVE / ENSURE PLUS) LIQD Take 237 mLs by mouth 3 (three) times daily between meals. 237 mL 12   furosemide (LASIX) 20 MG tablet TAKE 2 TABLETS (40 MG TOTAL) BY MOUTH DAILY. 180 tablet 1   guaiFENesin (MUCINEX) 600 MG 12 hr tablet Take 2 tablets (1,200 mg total) by mouth 2 (two) times daily as needed. 60 tablet 2   losartan (COZAAR) 25 MG tablet Take 0.5 tablets (12.5 mg total) by mouth daily. 30 tablet 0   meclizine (ANTIVERT) 12.5 MG tablet Take 1 tablet (12.5 mg total) by mouth 3 (three) times daily as needed for dizziness. 40 tablet 1   meloxicam (MOBIC) 15 MG tablet Take 15 mg by mouth daily.     metoprolol succinate (TOPROL XL) 25 MG 24 hr tablet Take 1 tablet (25 mg total) by mouth daily. 90 tablet 3   montelukast (SINGULAIR) 10 MG tablet Take 1 tablet (10 mg total) by mouth at bedtime. 30 tablet 11   Multiple Vitamins-Minerals (MULTIVITAMIN WITH MINERALS) tablet Take 1 tablet by mouth daily. Centrum silver     potassium chloride SA (KLOR-CON M) 20 MEQ tablet Take 20 mEq by mouth daily.     rosuvastatin (CRESTOR) 5 MG tablet TAKE 1 TABLET EVERY OTHER DAY 45 tablet 3   warfarin (COUMADIN) 5 MG tablet Take 1 tablet daily  except 1 1/2 tablets on Mondays or as directed. (Patient taking differently: Take 2.5-5 mg by mouth See admin instructions. Take 5 mg  tablet daily EXCEPT: Tuesday take 2.5 mg  in the morning or as directed.) 90 tablet 5   No current facility-administered medications on file prior to visit.        ROS:  All others reviewed and negative.  Objective        PE:  BP 120/72 (BP Location: Right Arm, Patient Position: Sitting, Cuff Size: Normal)   Pulse (!) 50   Temp 98.5 F (36.9 C) (Oral)   Ht 5\' 11"  (1.803 m)   Wt 190 lb (86.2 kg)   SpO2 97%   BMI 26.50 kg/m                 Constitutional: Pt appears in NAD, mil dill               HENT: Head: NCAT.                Right Ear: External ear normal.                 Left Ear: External ear normal. Bilat tm's with mild erythema.  Max sinus areas non tender.  Pharynx with mild erythema, no exudate               Eyes: . Pupils are equal, round, and reactive to light. Conjunctivae and EOM are normal               Nose: without d/c or deformity               Neck: Neck supple. Gross normal ROM               Cardiovascular: Normal rate and regular rhythm.                 Pulmonary/Chest: Effort normal and breath sounds with few RLL rales or wheezing.  Abd:  Soft, NT, ND, + BS, no organomegaly               Neurological: Pt is alert. At baseline orientation, motor grossly intact               Skin: Skin is warm. No rashes, no other new lesions, LE edema - none               Psychiatric: Pt behavior is normal without agitation   Micro: none  Cardiac tracings I have personally interpreted today:  none  Pertinent Radiological findings (summarize): none   Lab Results  Component Value Date   WBC 6.9 05/18/2023   HGB 13.7 05/18/2023   HCT 42.6 05/18/2023   PLT 258.0 05/18/2023   GLUCOSE 104 (H) 05/18/2023   CHOL 142 05/18/2023   TRIG 285.0 (H) 05/18/2023   HDL 38.20 (L) 05/18/2023   LDLDIRECT 102.0 10/09/2021   LDLCALC 47  05/18/2023   ALT 22 05/18/2023   AST 22 05/18/2023   NA 140 05/18/2023   K 4.2 05/18/2023   CL 101 05/18/2023   CREATININE 1.30 05/18/2023   BUN 23 05/18/2023   CO2 31 05/18/2023   TSH 1.77 05/18/2023   PSA 1.23 10/09/2021   INR 2.8 05/12/2023   HGBA1C 5.8 05/18/2023   Assessment/Plan:  [name] [name] is a 78 y.o. White or Caucasian [1] male with  has a past medical history of AICD (automatic cardioverter/defibrillator) present, ALLERGIC RHINITIS (10/31/2007), Allergy, Cancer (HCC) (12/03/2021), CHOLECYSTECTOMY, HX OF (06/04/2007), Chronic HFrEF (heart failure with reduced ejection fraction) (HCC), COLONIC POLYPS, HX OF (10/31/2007), CORONARY ARTERY BYPASS GRAFT, HX OF (06/04/2007), CORONARY ARTERY DISEASE (06/04/2007), Diverticulosis, Erectile dysfunction, Eye twitch, History of hiatal hernia, HYPERLIPIDEMIA (06/04/2007), Hypersomnolence, HYPERTENSION (06/04/2007), Hypothyroidism, ISCHEMIC CARDIOMYOPATHY (06/04/2007), Ischemic cardiomyopathy, LV (left ventricular) mural thrombus (02/15/2019), Myocardial infarction (HCC), Osteoarthritis, Peripheral vascular disease (HCC), PLMD (periodic limb movement disorder), Pneumonia, Vertigo, and Vitamin D deficiency.  Hyperlipidemia Lab Results  Component Value Date   LDLCALC 47 05/18/2023   Stable, pt to continue current statin crestor 5 mg qd   Essential hypertension BP Readings from Last 3 Encounters:  05/18/23 120/72  04/13/23 120/65  04/09/23 120/70   Stable, pt to continue medical treatment losartan 25 mg every day, toprol xl 25 qd   Vitamin D deficiency Last vitamin D Lab Results  Component Value Date   VD25OH 73.44 05/18/2023   Stable, cont oral replacement   Hyperglycemia Lab Results  Component Value Date   HGBA1C 5.8 05/18/2023   Stable, pt to continue current medical treatment jardiance 10 qd   Cough Etiology unclear, can't r/o bronchitis vs pna, for cxr, for augmentin course, cough med prn, labs including  cbc  Followup: Return in about 6 months (around 11/16/2023).  Oliver Barre, MD 05/20/2023 8:36 PM [facility]

## 2023-05-18 NOTE — Patient Instructions (Addendum)
Please take all new medication as prescribed - the antibiotic, and cough medicine as needed  Please continue all other medications as before, and refills have been done if requested.  Please have the pharmacy call with any other refills you may need.  Please keep your appointments with your specialists as you may have planned  Please go to the XRAY Department in the first floor for the x-ray testing  Please go to the LAB at the blood drawing area for the tests to be done  You will be contacted by phone if any changes need to be made immediately.  Otherwise, you will receive a letter about your results with an explanation, but please check with MyChart first.  Please make an Appointment to return in 6 months, or sooner if needed

## 2023-05-19 NOTE — Progress Notes (Signed)
The test results show that your current treatment is OK, as the tests are stable.  Please continue the same plan.  There is no other need for change of treatment or further evaluation based on these results, at this time.  thanks 

## 2023-05-20 ENCOUNTER — Encounter: Payer: Self-pay | Admitting: Internal Medicine

## 2023-05-20 NOTE — Assessment & Plan Note (Signed)
Lab Results  Component Value Date   HGBA1C 5.8 05/18/2023   Stable, pt to continue current medical treatment jardiance 10 qd

## 2023-05-20 NOTE — Assessment & Plan Note (Signed)
Lab Results  Component Value Date   LDLCALC 47 05/18/2023   Stable, pt to continue current statin crestor 5 mg qd

## 2023-05-20 NOTE — Assessment & Plan Note (Signed)
Last vitamin D Lab Results  Component Value Date   VD25OH 73.44 05/18/2023   Stable, cont oral replacement

## 2023-05-20 NOTE — Assessment & Plan Note (Signed)
Etiology unclear, can't r/o bronchitis vs pna, for cxr, for augmentin course, cough med prn, labs including cbc

## 2023-05-20 NOTE — Assessment & Plan Note (Signed)
BP Readings from Last 3 Encounters:  05/18/23 120/72  04/13/23 120/65  04/09/23 120/70   Stable, pt to continue medical treatment losartan 25 mg every day, toprol xl 25 qd

## 2023-05-24 ENCOUNTER — Ambulatory Visit: Payer: Medicare HMO | Attending: Cardiovascular Disease

## 2023-05-24 DIAGNOSIS — Z95810 Presence of automatic (implantable) cardiac defibrillator: Secondary | ICD-10-CM | POA: Diagnosis not present

## 2023-05-24 DIAGNOSIS — I5022 Chronic systolic (congestive) heart failure: Secondary | ICD-10-CM | POA: Diagnosis not present

## 2023-05-28 NOTE — Progress Notes (Signed)
EPIC Encounter for ICM Monitoring  Patient Name: [name] [name] is a 77 y.o. male Date: 05/28/2023 Primary Care Physican: Corwin Levins, MD Primary Cardiologist: Jens Som Electrophysiologist: Mealor 04/20/2022 Not weighing at home 05/07/2022 Weight: 181 lbs (does not weigh consistently at home) 09/29/2022 Office Weight: 190 lbs 11/17/2022 Weight: 185 lbs 04/05/2023 Office Weight: 192 lbs                                                         Spoke with patient and heart failure questions reviewed.  Transmission results reviewed.  Pt asymptomatic for fluid accumulation but legs feel weak.  He got a cold on vacation and difficulty getting rid of the cold.      DIET:  Pt uses salt shaker at home and does not follow low salt diet.  Also eats restaurant foods.     Optivol thoracic impedance suggesting suggesting normal fluid levels.      Prescribed: Furosemide 20 mg take 2 tablet(s) (40 mg total) by mouth daily   Jardiance 10 mg take 1 tablet by mouth daily   Labs: 01/08/2023 Creatinine 1.37, BUN 31, Potassium 3.8, Sodium 137, GFR 53 11/23/2022 Creatinine 1.68, BUN 42, Potassium 3.9, Sodium 135, GFR 42 11/12/2022 Creatinine 1.21, BUN 28, Potassium 3.8, Sodium 137, GFR >60 09/25/2022 Creatinine 1.37, BUN 30, Potassium 3.8, Sodium 3.8, GFR 53 08/13/2022 Creatinine 1.04, BUN 21, Potassium 3.5, Sodium 138  A complete set of results can be found in Results Review.   Recommendations:  No changes and encouraged to call if experiencing any fluid symptoms.   Follow-up plan: ICM clinic phone appointment on 06/28/2023.   91 day device clinic remote transmission 08/03/2023.     EP/Cardiology Office Visits:   Recall 08/23/2023 with Dr Jens Som.  Recall 07/25/2023 with Dr Nelly Laurence.    Copy of ICM check sent to Dr. Nelly Laurence.    3 month ICM trend: 05/25/2023.    12-14 Month ICM trend:     Karie Soda, RN 05/28/2023 2:11 PM

## 2023-05-30 IMAGING — CT CT BIOPSY
1 of 2 series · 14 of 32 positions shown, 19 images · non-contrast
Comparison: none

INDICATION: Abnormal soft tissue thickening in the right abdomen

[Series 2: i-spiral 5.0 b40f · axial · 0.60mm/px · z∈[+979,+1165]mm · 14 of 61 slices shown, 19 images]
[im 4/61  soft-tissue]
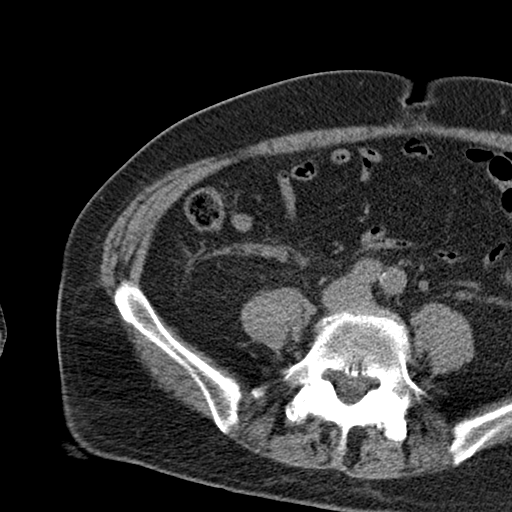
[im 4/61  bone]
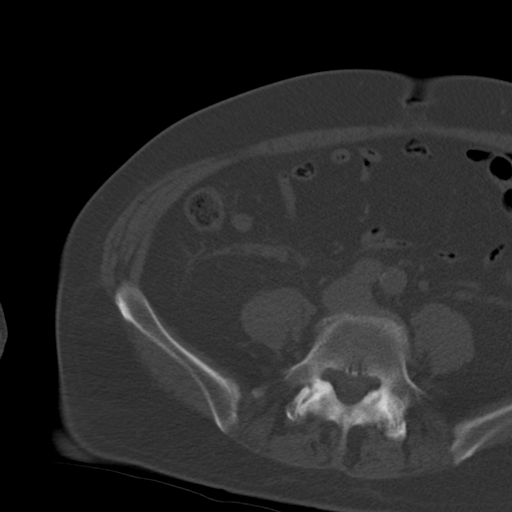
[im 8/61  soft-tissue]
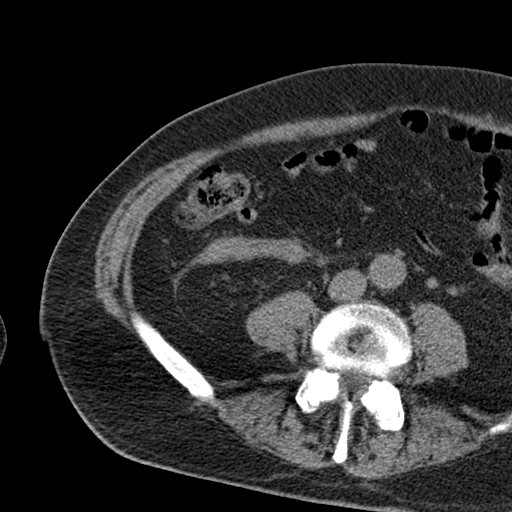
[im 12/61  soft-tissue]
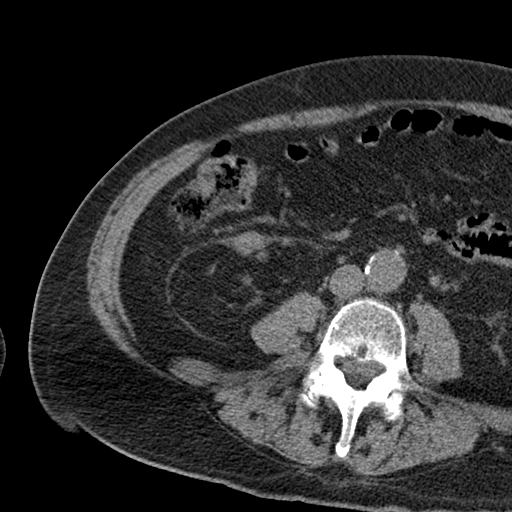
[im 19/61  soft-tissue]
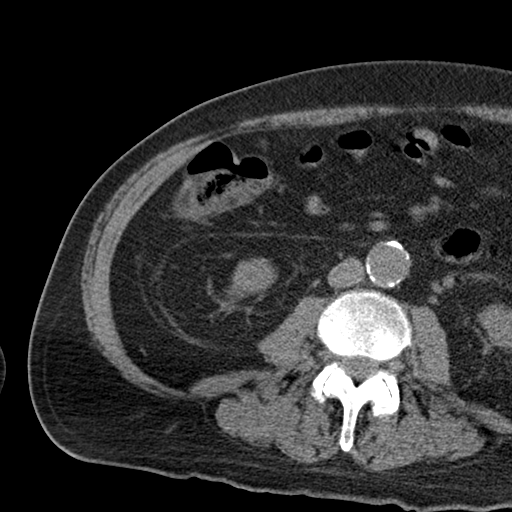
[im 23/61  soft-tissue]
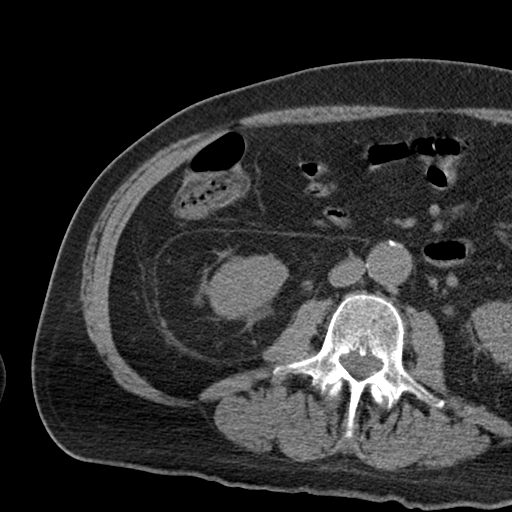
[im 27/61  soft-tissue]
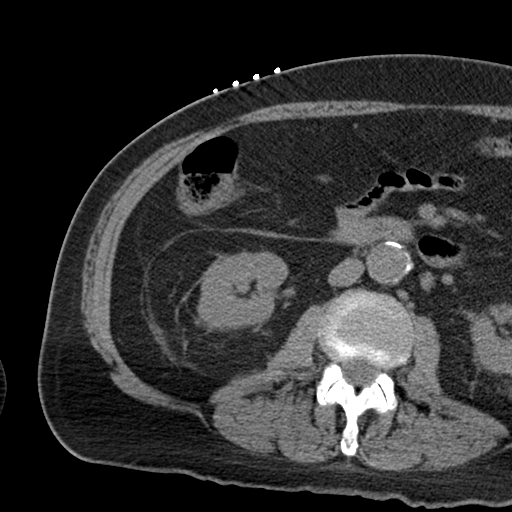
[im 31/61  soft-tissue]
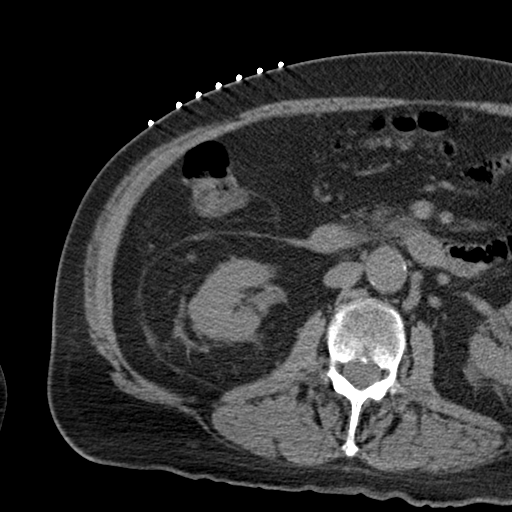
[im 34/61  soft-tissue]
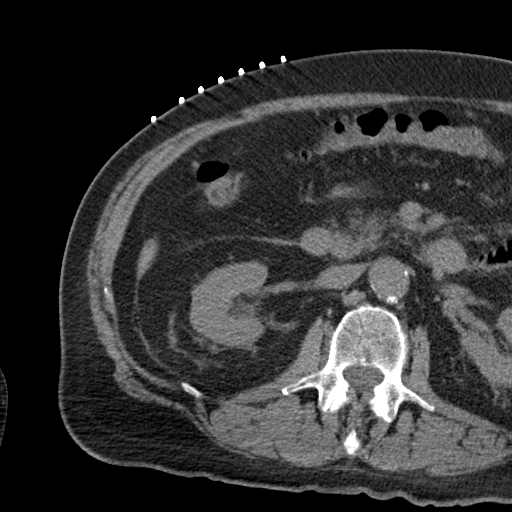
[im 38/61  soft-tissue]
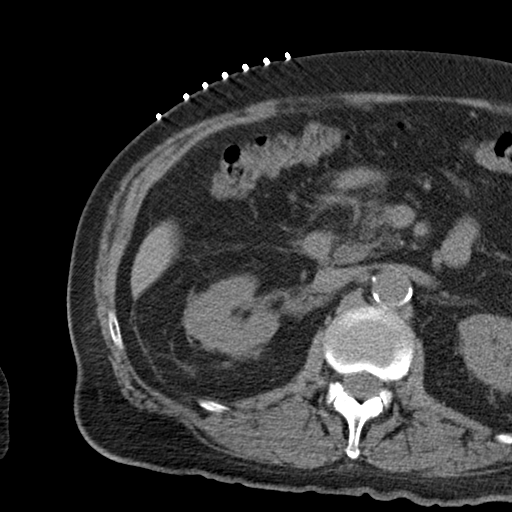
[im 38/61  bone]
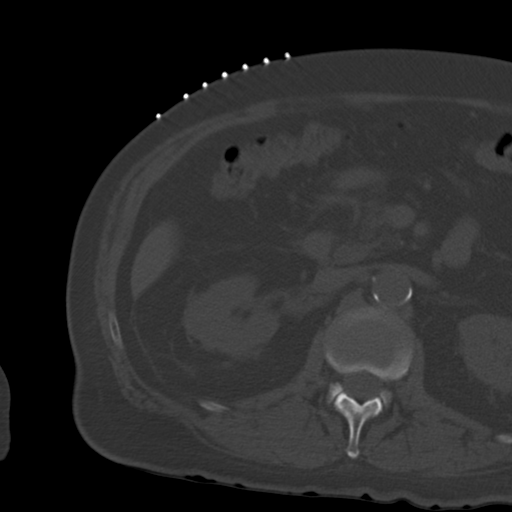
[im 42/61  soft-tissue]
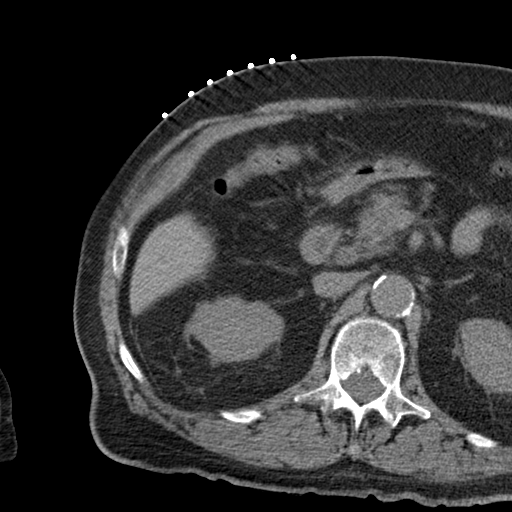
[im 46/61  lung]
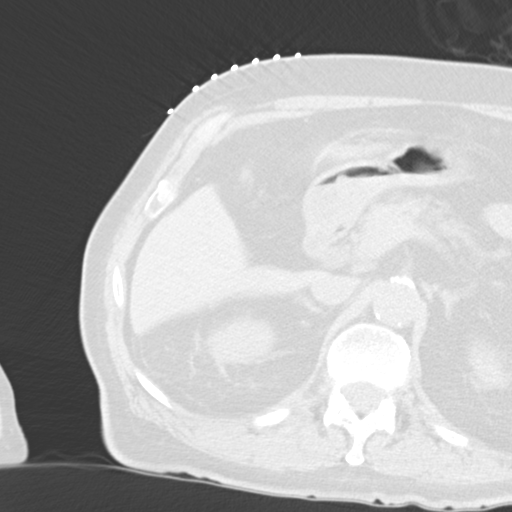
[im 49/61  soft-tissue]
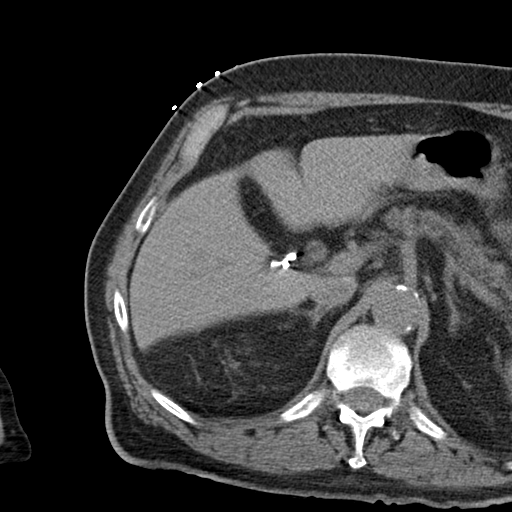
[im 49/61  lung]
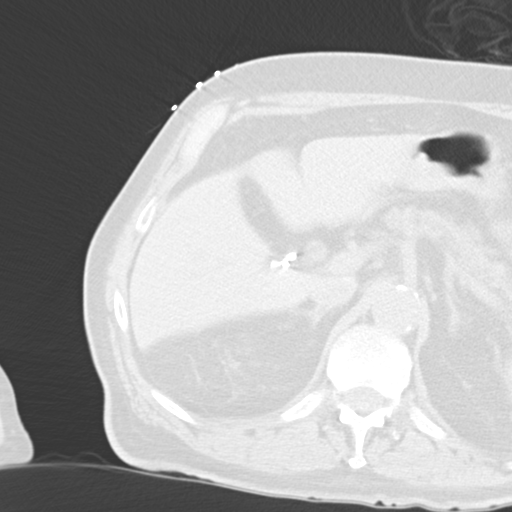
[im 53/61  soft-tissue]
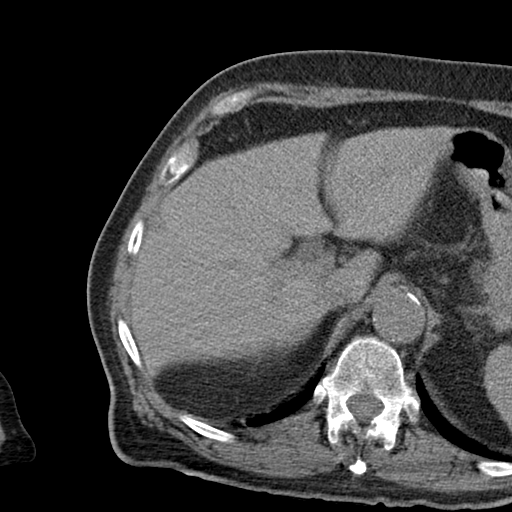
[im 53/61  lung]
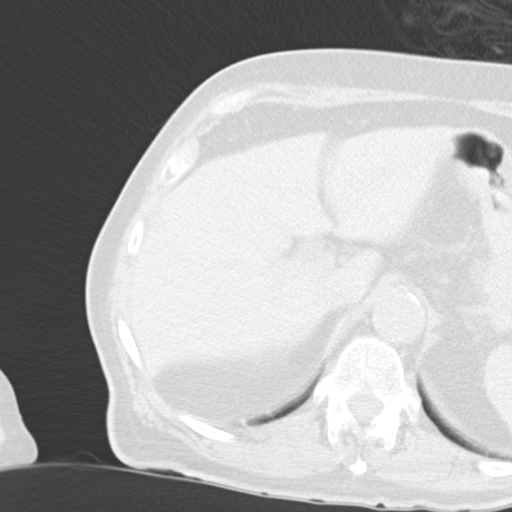
[im 57/61  soft-tissue]
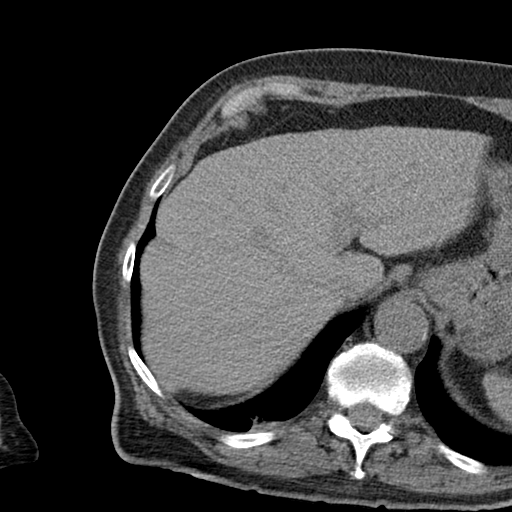
[im 57/61  lung]
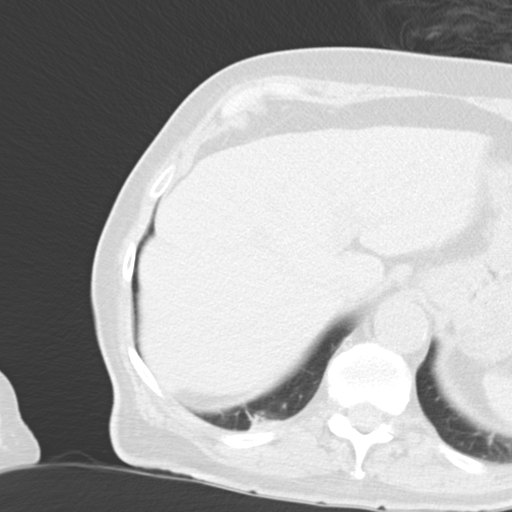

[14 of 32 positions shown; findings below may reference images not displayed]

EXAM:
CT BIOPSY

MEDICATIONS:
None.

ANESTHESIA/SEDATION:
Moderate (conscious) sedation was employed during this procedure. A
total of Versed 1.5 mg and Fentanyl 50 mcg was administered
intravenously.

Moderate Sedation Time: 17 minutes. The patient's level of
consciousness and vital signs were monitored continuously by
radiology nursing throughout the procedure under my direct
supervision.

FLUOROSCOPY TIME:  N/a

COMPLICATIONS:
None immediate.

PROCEDURE:
Informed written consent was obtained from the patient after a
thorough discussion of the procedural risks, benefits and
alternatives. All questions were addressed. Maximal Sterile Barrier
Technique was utilized including caps, mask, sterile gowns, sterile
gloves, sterile drape, hand hygiene and skin antiseptic. A timeout
was performed prior to the initiation of the procedure.

The patient was placed supine on the exam table. Limited CT of the
abdomen was performed for planning versus. Again this demonstrated
an area of soft tissue thickening in the right hemiabdomen. Skin
entry site was marked, and the overlying skin was prepped and draped
in the standard sterile fashion. Local analgesia was obtained with
1% lidocaine. Using intermittent CT fluoroscopy, a 17 gauge
introducer needle was advanced towards the identified soft tissue
target. Subsequently, core needle biopsy was performed using an 18
gauge core biopsy device x5 passes. Specimens were submitted in
formalin to pathology for further handling. Limited postprocedure
imaging demonstrated no hematoma or other complicating feature. A
clean dressing was placed after manual hemostasis. The patient
tolerated the procedure well and was transferred to recovery in
stable condition.
IMPRESSION: Successful CT-guided core needle biopsy of abnormal soft tissue
lesion in the right hemiabdomen.

## 2023-06-01 ENCOUNTER — Other Ambulatory Visit (HOSPITAL_COMMUNITY)
Admission: RE | Admit: 2023-06-01 | Discharge: 2023-06-01 | Disposition: A | Discharge status: Home / Self Care | Payer: No Typology Code available for payment source | Source: Ambulatory Visit | Attending: Nephrology | Admitting: Nephrology

## 2023-06-01 DIAGNOSIS — E7800 Pure hypercholesterolemia, unspecified: Secondary | ICD-10-CM | POA: Insufficient documentation

## 2023-06-01 DIAGNOSIS — R739 Hyperglycemia, unspecified: Secondary | ICD-10-CM | POA: Insufficient documentation

## 2023-06-01 LAB — RENAL FUNCTION PANEL
Albumin: 3.8 g/dL (ref 3.5–5.0)
Anion gap: 7 (ref 5–15)
BUN: 27 mg/dL — ABNORMAL HIGH (ref 8–23)
CO2: 27 mmol/L (ref 22–32)
Calcium: 8.9 mg/dL (ref 8.9–10.3)
Chloride: 100 mmol/L (ref 98–111)
Creatinine, Ser: 1.24 mg/dL (ref 0.61–1.24)
GFR, Estimated: 60 mL/min — ABNORMAL LOW (ref >60)
Glucose, Bld: 108 mg/dL — ABNORMAL HIGH (ref 70–99)
Phosphorus: 3.4 mg/dL (ref 2.5–4.6)
Potassium: 3.9 mmol/L (ref 3.5–5.1)
Sodium: 134 mmol/L — ABNORMAL LOW (ref 135–145)

## 2023-06-01 LAB — VITAMIN D 25 HYDROXY (VIT D DEFICIENCY, FRACTURES): Vit D, 25-Hydroxy: 70 ng/mL (ref 30–100)

## 2023-06-01 LAB — URINALYSIS, W/ REFLEX TO CULTURE (INFECTION SUSPECTED)
Bacteria, UA: NONE SEEN
Bilirubin Urine: NEGATIVE
Glucose, UA: >=500 mg/dL — AB
Hgb urine dipstick: NEGATIVE
Ketones, ur: NEGATIVE mg/dL
Leukocytes,Ua: NEGATIVE
Nitrite: NEGATIVE
Protein, ur: NEGATIVE mg/dL
Specific Gravity, Urine: 1.026 (ref 1.005–1.030)
pH: 5 (ref 5.0–8.0)

## 2023-06-01 LAB — CBC
HCT: 44.2 % (ref 39.0–52.0)
Hemoglobin: 13.8 g/dL (ref 13.0–17.0)
MCH: 30.3 pg (ref 26.0–34.0)
MCHC: 31.2 g/dL (ref 30.0–36.0)
MCV: 96.9 fL (ref 80.0–100.0)
Platelets: 149 10*3/uL — ABNORMAL LOW (ref 150–400)
RBC: 4.56 MIL/uL (ref 4.22–5.81)
RDW: 14.7 % (ref 11.5–15.5)
WBC: 5.5 10*3/uL (ref 4.0–10.5)
nRBC: 0 % (ref 0.0–0.2)

## 2023-06-01 LAB — HEMOGLOBIN A1C
Hgb A1c MFr Bld: 5.8 % — ABNORMAL HIGH (ref 4.8–5.6)
Mean Plasma Glucose: 119.76 mg/dL

## 2023-06-01 LAB — PROTEIN / CREATININE RATIO, URINE
Creatinine, Urine: 200 mg/dL
Protein Creatinine Ratio: 0.08 mg/mg{creat} (ref 0.00–0.15)
Total Protein, Urine: 15 mg/dL

## 2023-06-03 LAB — PARATHYROID HORMONE, INTACT (NO CA): PTH: 16 pg/mL (ref 15–65)

## 2023-06-07 DIAGNOSIS — N1831 Chronic kidney disease, stage 3a: Secondary | ICD-10-CM | POA: Diagnosis not present

## 2023-06-07 DIAGNOSIS — I701 Atherosclerosis of renal artery: Secondary | ICD-10-CM | POA: Diagnosis not present

## 2023-06-07 DIAGNOSIS — I5022 Chronic systolic (congestive) heart failure: Secondary | ICD-10-CM | POA: Diagnosis not present

## 2023-06-07 DIAGNOSIS — I129 Hypertensive chronic kidney disease with stage 1 through stage 4 chronic kidney disease, or unspecified chronic kidney disease: Secondary | ICD-10-CM | POA: Diagnosis not present

## 2023-06-09 ENCOUNTER — Ambulatory Visit: Payer: Medicare HMO | Attending: Cardiology | Admitting: *Deleted

## 2023-06-09 DIAGNOSIS — I513 Intracardiac thrombosis, not elsewhere classified: Secondary | ICD-10-CM

## 2023-06-09 DIAGNOSIS — Z5181 Encounter for therapeutic drug level monitoring: Secondary | ICD-10-CM | POA: Diagnosis not present

## 2023-06-09 LAB — POCT INR: INR: 1.6 — AB (ref 2.0–3.0)

## 2023-06-09 NOTE — Patient Instructions (Signed)
Take warfarin 1 1/2 tablets tonight and tomorrow then resume 1 tablet daily except 1/2 tablet on Tuesdays Continue greens Recheck in 2 wks

## 2023-06-16 IMAGING — US IR IMAGING GUIDED PORT INSERTION
1 series · 1 of 1 positions shown · non-contrast
Comparison: none

INDICATION: 75-year-old male referred for port catheter placement

[Series 1: ir fluoro/shunt/fist · 1 of 1 slices shown]
[im 1/1]
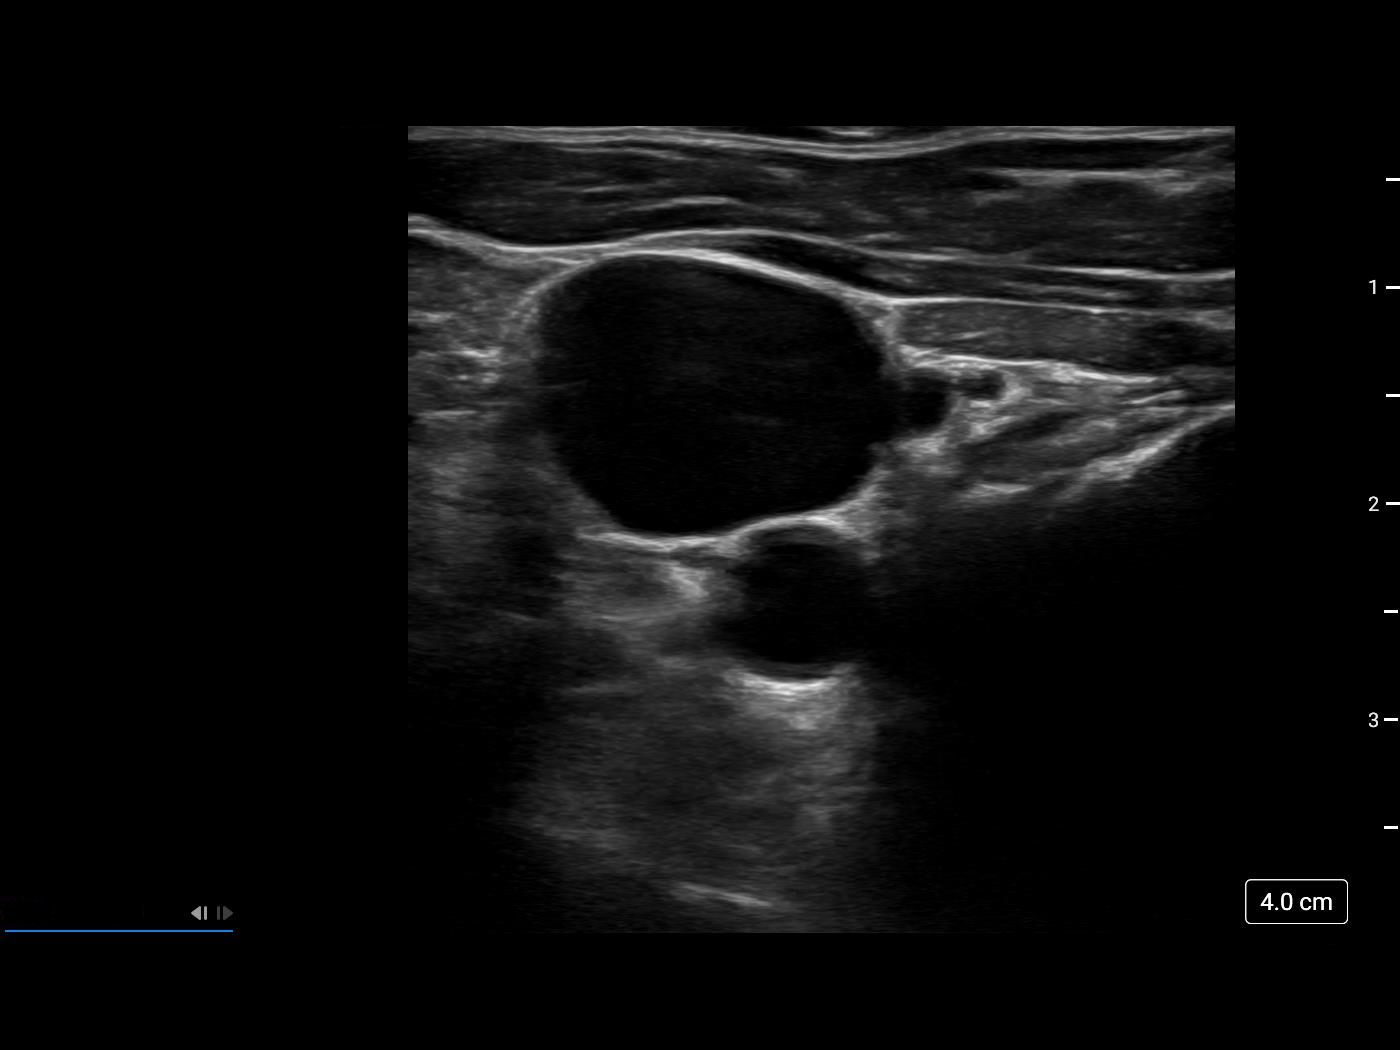

[1 of 1 positions shown; findings below may reference images not displayed]

EXAM:
IMAGE GUIDED PORT CATHETER

MEDICATIONS:
None

ANESTHESIA/SEDATION:
Moderate (conscious) sedation was employed during this procedure. A
total of Versed 1.0 mg and Fentanyl 75 mcg was administered
intravenously.

Moderate Sedation Time: 19 minutes. The patient's level of
consciousness and vital signs were monitored continuously by
radiology nursing throughout the procedure under my direct
supervision.

FLUOROSCOPY TIME:  Fluoroscopy Time: 0 minutes 12 seconds (0 mGy).

COMPLICATIONS:
None

PROCEDURE:
The procedure, risks, benefits, and alternatives were explained to
the patient. Questions regarding the procedure were encouraged and
answered. The patient understands and consents to the procedure.

Ultrasound survey was performed with images stored and sent to PACs.
Right IJ vein documented to be patent.

The right neck and chest was prepped with chlorhexidine, and draped
in the usual sterile fashion using maximum barrier technique (cap
and mask, sterile gown, sterile gloves, large sterile sheet, hand
hygiene and cutaneous antiseptic). Local anesthesia was attained by
infiltration with 1% lidocaine without epinephrine.

Ultrasound demonstrated patency of the right internal jugular vein,
and this was documented with an image. Under real-time ultrasound
guidance, this vein was accessed with a 21 gauge micropuncture
needle and image documentation was performed. A small dermatotomy
was made at the access site with an 11 scalpel. A 0.018" wire was
advanced into the SVC and used to estimate the length of the
internal catheter. The access needle exchanged for a 4F
micropuncture vascular sheath. The 0.018" wire was then removed and
a 0.035" wire advanced into the IVC.



The venous access site was then serially dilated and a peel away
vascular sheath placed over the wire. The wire was removed and the
port catheter advanced into position under fluoroscopic guidance.
The catheter tip is positioned in the cavoatrial junction. This was
documented with a spot image. The portacatheter was then tested and
found to flush and aspirate well. The port was flushed with saline
followed by 100 units/mL heparinized saline.

The pocket was then closed in two layers using first subdermal
inverted interrupted absorbable sutures followed by a running
subcuticular suture. The epidermis was then sealed with Dermabond.
The dermatotomy at the venous access site was also seal with
Dermabond.

Patient tolerated the procedure well and remained hemodynamically
stable throughout.

No complications encountered and no significant blood loss
encountered
IMPRESSION: Status post right IJ port catheter placement.

## 2023-06-23 ENCOUNTER — Ambulatory Visit: Payer: Medicare HMO | Attending: Cardiology | Admitting: *Deleted

## 2023-06-23 DIAGNOSIS — Z5181 Encounter for therapeutic drug level monitoring: Secondary | ICD-10-CM | POA: Diagnosis not present

## 2023-06-23 DIAGNOSIS — I513 Intracardiac thrombosis, not elsewhere classified: Secondary | ICD-10-CM | POA: Diagnosis not present

## 2023-06-23 LAB — POCT INR: INR: 2.9 (ref 2.0–3.0)

## 2023-06-23 NOTE — Patient Instructions (Signed)
Continue warfarin 1 tablet daily except 1/2 tablet on Tuesdays Continue greens Recheck in 4 wks

## 2023-06-28 ENCOUNTER — Other Ambulatory Visit: Payer: Self-pay | Admitting: Cardiology

## 2023-06-28 ENCOUNTER — Ambulatory Visit: Payer: Medicare HMO | Attending: Cardiovascular Disease

## 2023-06-28 ENCOUNTER — Telehealth: Payer: Self-pay

## 2023-06-28 DIAGNOSIS — I5022 Chronic systolic (congestive) heart failure: Secondary | ICD-10-CM | POA: Diagnosis not present

## 2023-06-28 DIAGNOSIS — Z95810 Presence of automatic (implantable) cardiac defibrillator: Secondary | ICD-10-CM | POA: Diagnosis not present

## 2023-06-28 NOTE — Progress Notes (Signed)
Attempted return call to patient and unable to reach.  Left detailed message per DPR regarding transmission. Transmission reviewed.  

## 2023-06-28 NOTE — Telephone Encounter (Signed)
Remote ICM transmission received.  Attempted call to patient regarding ICM remote transmission and no answer.  

## 2023-06-28 NOTE — Progress Notes (Signed)
EPIC Encounter for ICM Monitoring  Patient Name: [name] [name] is a 77 y.o. male Date: 06/28/2023 Primary Care Physican: Corwin Levins, MD Primary Cardiologist: Jens Som Electrophysiologist: Mealor 04/20/2022 Not weighing at home 05/07/2022 Weight: 181 lbs (does not weigh consistently at home) 09/29/2022 Office Weight: 190 lbs 11/17/2022 Weight: 185 lbs 04/05/2023 Office Weight: 192 lbs                                                         Attempted call to patient and unable to reach.   Transmission reviewed.     DIET:  Pt uses salt shaker at home and does not follow low salt diet.  Also eats restaurant foods.     Optivol thoracic impedance suggesting possible fluid accumulation starting 11/3 but trending back toward baseline.      Prescribed: Furosemide 20 mg take 2 tablet(s) (40 mg total) by mouth daily   Jardiance 10 mg take 1 tablet by mouth daily   Labs: 01/08/2023 Creatinine 1.37, BUN 31, Potassium 3.8, Sodium 137, GFR 53 11/23/2022 Creatinine 1.68, BUN 42, Potassium 3.9, Sodium 135, GFR 42 11/12/2022 Creatinine 1.21, BUN 28, Potassium 3.8, Sodium 137, GFR >60 09/25/2022 Creatinine 1.37, BUN 30, Potassium 3.8, Sodium 3.8, GFR 53 08/13/2022 Creatinine 1.04, BUN 21, Potassium 3.5, Sodium 138  A complete set of results can be found in Results Review.   Recommendations:  Unable to reach.     Follow-up plan: ICM clinic phone appointment on 08/02/2023.   91 day device clinic remote transmission 08/03/2023.     EP/Cardiology Office Visits:   Recall 08/23/2023 with Dr Jens Som.  Recall 07/25/2023 with Dr Nelly Laurence.    Copy of ICM check sent to Dr. Nelly Laurence.    3 month ICM trend: 06/28/2023.    12-14 Month ICM trend:     Karie Soda, RN 06/28/2023 11:02 AM

## 2023-07-20 ENCOUNTER — Ambulatory Visit (INDEPENDENT_AMBULATORY_CARE_PROVIDER_SITE_OTHER): Payer: No Typology Code available for payment source | Admitting: Vascular Surgery

## 2023-07-20 ENCOUNTER — Ambulatory Visit (INDEPENDENT_AMBULATORY_CARE_PROVIDER_SITE_OTHER)
Admission: RE | Admit: 2023-07-20 | Discharge: 2023-07-20 | Disposition: A | Discharge status: Home / Self Care | Payer: No Typology Code available for payment source | Source: Ambulatory Visit | Attending: Vascular Surgery

## 2023-07-20 ENCOUNTER — Ambulatory Visit (HOSPITAL_COMMUNITY)
Admission: RE | Admit: 2023-07-20 | Discharge: 2023-07-20 | Disposition: A | Discharge status: Home / Self Care | Payer: No Typology Code available for payment source | Source: Ambulatory Visit | Attending: Vascular Surgery | Admitting: Vascular Surgery

## 2023-07-20 ENCOUNTER — Encounter: Payer: Self-pay | Admitting: Vascular Surgery

## 2023-07-20 VITALS — BP 102/60 | HR 61 | Temp 97.8°F | Ht 71.0 in | Wt 199.4 lb

## 2023-07-20 DIAGNOSIS — I724 Aneurysm of artery of lower extremity: Secondary | ICD-10-CM

## 2023-07-20 DIAGNOSIS — I739 Peripheral vascular disease, unspecified: Secondary | ICD-10-CM | POA: Diagnosis not present

## 2023-07-20 LAB — VAS US ABI WITH/WO TBI
Left ABI: 1.34
Right ABI: 1.15

## 2023-07-20 NOTE — Progress Notes (Signed)
Patient name: [name] [name] MRN: [id number] DOB: 08-14-1945 Sex: male  REASON FOR CONSULT: Surveillance right leg bypass  HPI: [name] [name] is a 77 y.o. male, with history of lymphoma undergoing chemotherapy, hypertension, hyperlipidemia, coronary artery disease status post CABG that presents for surveillance of his right leg bypass. 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  This was complicated by a seroma that required I&D.  Today he states the leg incision healed from his last I&D.  Still feels like he gets some fullness around the knee joint when walking.  Past Medical History:  Diagnosis Date   AICD (automatic cardioverter/defibrillator) present    ALLERGIC RHINITIS 10/31/2007   Qualifier: Diagnosis of  By: Jonny Ruiz MD, Len Blalock    Allergy    Cancer (HCC) 12/03/2021   non-Hodgkin's B-cell lymphoma   CHOLECYSTECTOMY, HX OF 06/04/2007   Qualifier: Diagnosis of  By: Genelle Gather CMA, [place]     Chronic HFrEF (heart failure with reduced ejection fraction) (HCC)    COLONIC POLYPS, HX OF 10/31/2007   Qualifier: Diagnosis of  By: Jonny Ruiz MD, Len Blalock    CORONARY ARTERY BYPASS GRAFT, HX OF 06/04/2007   Qualifier: Diagnosis of  By: Genelle Gather CMA, [place]     CORONARY ARTERY DISEASE 06/04/2007   Qualifier: Diagnosis of  By: Genelle Gather CMA, [place]     Diverticulosis    Erectile dysfunction    Eye twitch    right eye since chilhood    History of hiatal hernia    HYPERLIPIDEMIA 06/04/2007   Qualifier: Diagnosis of  By: Genelle Gather CMA, [place]     Hypersomnolence    HYPERTENSION 06/04/2007   Qualifier: Diagnosis of  By: Genelle Gather CMA, [place]     Hypothyroidism    pt does not take medications and doesn't know if he has it   ISCHEMIC CARDIOMYOPATHY 06/04/2007   Qualifier: Diagnosis of  By: Genelle Gather CMA, [place]     Ischemic cardiomyopathy    LV (left ventricular)  mural thrombus 02/15/2019   Myocardial infarction [facility])    per patient , his occurred in 1998    Osteoarthritis    Peripheral vascular disease (HCC)    PLMD (periodic limb movement disorder)    Pneumonia    Vertigo    Vitamin D deficiency     Past Surgical History:  Procedure Laterality Date   BIOPSY  11/25/2021   Procedure: BIOPSY;  Surgeon: Lynann Bologna, MD;  Location: [facility];  Service: Gastroenterology;;   CABG  1999   [facility] [place], [place].  Patient reports that a small vein was taken from under his arm and a small incesion was made.   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 05/20/2022   Procedure: LIGATION OF POPLITEAL ARTERY ANEURYSM;  Surgeon: Cephus Shelling, MD;  Location: [facility] OR;  Service: Vascular;  Laterality: Right;   ESOPHAGOGASTRODUODENOSCOPY (EGD) WITH PROPOFOL N/A 11/25/2021   Procedure: ESOPHAGOGASTRODUODENOSCOPY (EGD) WITH PROPOFOL;  Surgeon: Lynann Bologna, MD;  Location: [facility];  Service: Gastroenterology;  Laterality: N/A;   FEMORAL-POPLITEAL BYPASS GRAFT Right 05/20/2022   Procedure: RIGHT ABOVE KNEE POPLITEAL ARTERY TO TIBIOPERONEAL TRUNK/POSTERIOR  TIBIAL ARTERY BYPASS;  Surgeon: Cephus Shelling, MD;  Location: [facility] OR;  Service: Vascular;  Laterality: Right;   ICD IMPLANT N/A 01/30/2020   Procedure: ICD IMPLANT;  Surgeon: Hillis Range, MD;  Location: [facility] CV LAB;  Service: Cardiovascular;  Laterality: N/A;   INCISION AND DRAINAGE Right 01/08/2023   Procedure: INCISION AND DRAINAGE OF RIGHT LEG  SEROMA, DRAIN PLACEMENT AND MYRIAD MORCELLS;  Surgeon: Cephus Shelling, MD;  Location: [facility] OR;  Service: Vascular;  Laterality: Right;   IR IMAGING GUIDED PORT INSERTION   12/04/2021   KNEE ARTHROPLASTY Left 08/05/2017   Procedure: LEFT TOTAL KNEE ARTHROPLASTY WITH COMPUTER NAVIGATION;  Surgeon: Samson Frederic, MD;  Location: [facility];  Service: Orthopedics;  Laterality: Left;  Needs RNFA   LIGATION OF CILIAC ARTERY Right 05/20/2022   Procedure: LIGATION OF RIGHT ANTERIOR TIBIAL ARTERY;  Surgeon: Cephus Shelling, MD;  [facility] OR;  Service: Vascular;  Laterality: Right;   LOWER EXTREMITY ANGIOGRAPHY Right 05/14/2022   Procedure: Lower Extremity Angiography;  Surgeon: Cephus Shelling, MD;  Location: [facility] INVASIVE CV LAB;  Service: Cardiovascular;  Laterality: Right;   PENILE PROSTHESIS IMPLANT     POLYPECTOMY  11/25/2021   Procedure: POLYPECTOMY;  Surgeon: Lynann Bologna, MD;  Location: [facility];  Service: Gastroenterology;;   REPLACEMENT TOTAL KNEE  06/12/2013   spinal cyst     THORACOTOMY     left anterior; wound exploration and debridement   TONSILLECTOMY AND ADENOIDECTOMY     age 80   VEIN HARVEST Right 05/20/2022   Procedure: RIGHT GREATER SAPHENOUS VEIN HARVEST;  Surgeon: Cephus Shelling, MD;  Location: [facility] OR;  Service: Vascular;  Laterality: Right;    Family History  Problem Relation Age of Onset   Stomach cancer Mother        smokes   Lung cancer Father        chewed tobacco   Heart disease Brother        first MI at 61yo, now 69 for transplant list/ ICM   Pancreatic cancer Brother    Colon cancer Maternal Grandfather    COPD Son        was a smoker   Pancreatic cancer Maternal Uncle    Pancreatic cancer Cousin        mat side x 4   Esophageal cancer Neg Hx    Prostate cancer Neg Hx    Rectal cancer Neg Hx     SOCIAL HISTORY: Social History   Socioeconomic History   Marital status: Married    Spouse name: Kathie Rhodes   Number of children: 3   Years of education: Not on file   Highest education level: Not on file  Occupational History   Occupation: truck Air traffic controller: [employer]  Tobacco Use   Smoking  status: Former    Current packs/day: 0.00    Average packs/day: 4.0 packs/day for 17.0 years (68.0 ttl pk-yrs)    Types: Cigarettes    Start date: 05/07/1964    Quit date: 05/07/1981    Years since quitting: 42.2    Passive exposure: Past   Smokeless tobacco: Never   Tobacco comments:    Pt states that he would let most of them "burn" pt states that he used anywhere between 4-5PPD  Vaping Use   Vaping status: Never Used  Substance and Sexual Activity   Alcohol use: Yes    Alcohol/week: 2.0 standard drinks of alcohol    Types: 2 Cans  of beer per week   Drug use: No   Sexual activity: Not on file  Other Topics Concern   Not on file  Social History Narrative   Lives in [place] with spouse   Retired Naval architect   Social Determinants of Health   Financial Resource Strain: Low Risk  (10/29/2022)   Overall Financial Resource Strain (CARDIA)    Difficulty of Paying Living Expenses: Not hard at all  Food Insecurity: No Food Insecurity (10/29/2022)   Hunger Vital Sign    Worried About Running Out of Food in the Last Year: Never true    Ran Out of Food in the Last Year: Never true  Transportation Needs: No Transportation Needs (10/29/2022)   PRAPARE - Administrator, Civil Service (Medical): No    Lack of Transportation (Non-Medical): No  Physical Activity: Inactive (10/29/2022)   Exercise Vital Sign    Days of Exercise per Week: 0 days    Minutes of Exercise per Session: 0 min  Stress: No Stress Concern Present (10/29/2022)   [employer] of Occupational Health - Occupational Stress Questionnaire    Feeling of Stress : Not at all  Social Connections: Socially Integrated (10/29/2022)   Social Connection and Isolation Panel [NHANES]    Frequency of Communication with Friends and Family: More than three times a week    Frequency of Social Gatherings with Friends and Family: More than three times a week    Attends Religious Services: More than 4 times per year     Active Member of [employer] or Organizations: Yes    Attends Engineer, structural: More than 4 times per year    Marital Status: Married  Catering manager Violence: Not At Risk (10/29/2022)   Humiliation, Afraid, Rape, and Kick questionnaire    Fear of Current or Ex-Partner: No    Emotionally Abused: No    Physically Abused: No    Sexually Abused: No    Allergies  Allergen Reactions   Ace Inhibitors Cough   Statins Other (See Comments)    Muscle pains    Current Outpatient Medications  Medication Sig Dispense Refill   Ascorbic Acid (VITA-C PO) Take 1,000 mg by mouth daily.     aspirin EC 81 MG tablet Take 1 tablet (81 mg total) by mouth daily at 6 (six) AM. Swallow whole. 30 tablet 12   b complex vitamins capsule Take 1 capsule by mouth daily.     cholecalciferol (VITAMIN D3) 25 MCG (1000 UNIT) tablet Take 1,000 Units by mouth daily.     empagliflozin (JARDIANCE) 10 MG TABS tablet Take 5 mg by mouth daily.     ezetimibe (ZETIA) 10 MG tablet TAKE 1 TABLET EVERY DAY 90 tablet 2   furosemide (LASIX) 20 MG tablet TAKE 2 TABLETS (40 MG TOTAL) BY MOUTH DAILY. 180 tablet 1   losartan (COZAAR) 25 MG tablet Take 0.5 tablets (12.5 mg total) by mouth daily. 30 tablet 0   meloxicam (MOBIC) 15 MG tablet Take 15 mg by mouth daily.     metoprolol succinate (TOPROL XL) 25 MG 24 hr tablet Take 1 tablet (25 mg total) by mouth daily. 90 tablet 3   montelukast (SINGULAIR) 10 MG tablet Take 1 tablet (10 mg total) by mouth at bedtime. 30 tablet 11   Multiple Vitamins-Minerals (MULTIVITAMIN WITH MINERALS) tablet Take 1 tablet by mouth daily. Centrum silver     rosuvastatin (CRESTOR) 5 MG tablet TAKE 1 TABLET EVERY OTHER DAY 45 tablet  2   warfarin (COUMADIN) 5 MG tablet Take 1 tablet daily except 1 1/2 tablets on Mondays or as directed. (Patient taking differently: Take 2.5-5 mg by mouth See admin instructions. Take 5 mg  tablet daily EXCEPT: Tuesday take 2.5 mg  in the morning or as directed.) 90 tablet  5   amoxicillin-clavulanate (AUGMENTIN) 875-125 MG tablet Take 1 tablet by mouth 2 (two) times daily. (Patient not taking: Reported on 07/20/2023) 20 tablet 0   feeding supplement (ENSURE ENLIVE / ENSURE PLUS) LIQD Take 237 mLs by mouth 3 (three) times daily between meals. (Patient not taking: Reported on 07/20/2023) 237 mL 12   guaiFENesin (MUCINEX) 600 MG 12 hr tablet Take 2 tablets (1,200 mg total) by mouth 2 (two) times daily as needed. (Patient not taking: Reported on 07/20/2023) 60 tablet 2   meclizine (ANTIVERT) 12.5 MG tablet Take 1 tablet (12.5 mg total) by mouth 3 (three) times daily as needed for dizziness. (Patient not taking: Reported on 07/20/2023) 40 tablet 1   potassium chloride SA (KLOR-CON M) 20 MEQ tablet Take 20 mEq by mouth daily. (Patient not taking: Reported on 07/20/2023)     No current facility-administered medications for this visit.    REVIEW OF SYSTEMS:  [X]  denotes positive finding, [ ]  denotes negative finding Cardiac  Comments:  Chest pain or chest pressure:    Shortness of breath upon exertion:    Short of breath when lying flat:    Irregular heart rhythm:        Vascular    Pain in calf, thigh, or hip brought on by ambulation:    Pain in feet at night that wakes you up from your sleep:     Blood clot in your veins:    Leg swelling:         Pulmonary    Oxygen at home:    Productive cough:     Wheezing:         Neurologic    Sudden weakness in arms or legs:     Sudden numbness in arms or legs:     Sudden onset of difficulty speaking or slurred speech:    Temporary loss of vision in one eye:     Problems with dizziness:         Gastrointestinal    Blood in stool:     Vomited blood:         Genitourinary    Burning when urinating:     Blood in urine:        Psychiatric    Major depression:         Hematologic    Bleeding problems:    Problems with blood clotting too easily:        Skin    Rashes or ulcers:        Constitutional     Fever or chills:      PHYSICAL EXAM: Vitals:   07/20/23 1343  BP: 102/60  Pulse: 61  Temp: 97.8 F (36.6 C)  TempSrc: Temporal  SpO2: 90%  Weight: 199 lb 6.4 oz (90.4 kg)  Height: 5\' 11"  (1.803 m)    GENERAL: The patient is a well-nourished male, in no acute distress. The vital signs are documented above. CARDIAC: There is a regular rate and rhythm.  VASCULAR:  Right femoral pulse palpable Right DP palpable No recurrent right leg seroma PULMONARY: No respiratory distress ABDOMEN: Soft and non-tender. MUSCULOSKELETAL: There are no major deformities or cyanosis. NEUROLOGIC: No focal  weakness or paresthesias are detected. SKIN: There are no ulcers or rashes noted. PSYCHIATRIC: The patient has a normal affect.  DATA:   Right leg duplex today shows widely patent bypass  ABIs today are 1.15 on the right multiphasic and 1.34 on the left multiphasic  Assessment/Plan:  77 y.o. male, with history of lymphoma undergoing chemotherapy, hypertension, hyperlipidemia, coronary artery disease status post CABG that presents for follow-up of his right leg bypass.  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  This was complicated by a seroma that required I&D.  Today he has a palpable dorsalis pedis pulse in the right foot.  Bypass duplex shows that this is widely patent without any recurrent stenosis.  He has preserved ABIs.  I will see him again in 6 months for ongoing surveillance.  I think he looks good from my standpoint.  I do not see any evidence of recurrent seroma as his previous fluid collection has all resolved on exam.  He has no pain in the popliteal fossa where his aneurysm was previously located.  He complains of most of his discomfort around the patella and the knee anteriorly and has a prior knee replacement.   Cephus Shelling,  MD [facility] Office: [phone]

## 2023-07-21 ENCOUNTER — Other Ambulatory Visit: Payer: Self-pay | Admitting: Cardiology

## 2023-07-21 ENCOUNTER — Other Ambulatory Visit: Payer: Self-pay | Admitting: Physician Assistant

## 2023-07-21 ENCOUNTER — Ambulatory Visit: Payer: Medicare HMO | Attending: Cardiology | Admitting: *Deleted

## 2023-07-21 DIAGNOSIS — Z5181 Encounter for therapeutic drug level monitoring: Secondary | ICD-10-CM

## 2023-07-21 DIAGNOSIS — I513 Intracardiac thrombosis, not elsewhere classified: Secondary | ICD-10-CM

## 2023-07-21 LAB — POCT INR: INR: 2.6 (ref 2.0–3.0)

## 2023-07-21 NOTE — Patient Instructions (Signed)
 Continue warfarin 1 tablet daily except 1/2 tablet on Tuesdays Continue greens Recheck in 4 wks

## 2023-07-27 ENCOUNTER — Other Ambulatory Visit: Payer: Self-pay

## 2023-07-27 DIAGNOSIS — I739 Peripheral vascular disease, unspecified: Secondary | ICD-10-CM

## 2023-07-30 ENCOUNTER — Other Ambulatory Visit: Payer: Self-pay | Admitting: Cardiology

## 2023-07-30 DIAGNOSIS — Z7901 Long term (current) use of anticoagulants: Secondary | ICD-10-CM

## 2023-07-30 NOTE — Telephone Encounter (Signed)
Warfarin 5mg  refill Apical Thrombus Last INR 07/21/23 Last OV 02/05/23

## 2023-08-02 ENCOUNTER — Ambulatory Visit: Payer: Medicare HMO | Attending: Cardiovascular Disease

## 2023-08-02 DIAGNOSIS — Z95810 Presence of automatic (implantable) cardiac defibrillator: Secondary | ICD-10-CM

## 2023-08-02 DIAGNOSIS — I5022 Chronic systolic (congestive) heart failure: Secondary | ICD-10-CM | POA: Diagnosis not present

## 2023-08-03 ENCOUNTER — Ambulatory Visit (INDEPENDENT_AMBULATORY_CARE_PROVIDER_SITE_OTHER): Payer: Medicare HMO

## 2023-08-03 DIAGNOSIS — I255 Ischemic cardiomyopathy: Secondary | ICD-10-CM

## 2023-08-03 DIAGNOSIS — I5022 Chronic systolic (congestive) heart failure: Secondary | ICD-10-CM | POA: Diagnosis not present

## 2023-08-03 NOTE — Progress Notes (Signed)
EPIC Encounter for ICM Monitoring  Patient Name: [name] [name] is a 77 y.o. male Date: 08/03/2023 Primary Care Physican: Corwin Levins, MD Primary Cardiologist: Jens Som Electrophysiologist: Mealor 04/20/2022 Not weighing at home 05/07/2022 Weight: 181 lbs (does not weigh consistently at home) 09/29/2022 Office Weight: 190 lbs 11/17/2022 Weight: 185 lbs 04/05/2023 Office Weight: 192 lbs       08/03/2023 Weight: 190 lbs                                             Spoke with patient and heart failure questions reviewed.  Transmission results reviewed.  Pt asymptomatic for fluid accumulation.  Reports feeling well at this time and voices no complaints.      DIET:  Pt uses salt shaker at home and does not follow low salt diet.  Also eats restaurant foods.     Optivol thoracic impedance suggesting possible fluid accumulation starting 12/18.      Prescribed: Furosemide 20 mg take 2 tablet(s) (40 mg total) by mouth daily   Jardiance 10 mg take 1 tablet by mouth daily   Labs: 01/08/2023 Creatinine 1.37, BUN 31, Potassium 3.8, Sodium 137, GFR 53 11/23/2022 Creatinine 1.68, BUN 42, Potassium 3.9, Sodium 135, GFR 42 11/12/2022 Creatinine 1.21, BUN 28, Potassium 3.8, Sodium 137, GFR >60 09/25/2022 Creatinine 1.37, BUN 30, Potassium 3.8, Sodium 3.8, GFR 53 08/13/2022 Creatinine 1.04, BUN 21, Potassium 3.5, Sodium 138  A complete set of results can be found in Results Review.   Recommendations:  Encouraged to limit salt intake.  No changes and encouraged to call if experiencing any fluid symptoms.   Follow-up plan: ICM clinic phone appointment on 09/06/2023.   91 day device clinic remote transmission 11/02/2023.     EP/Cardiology Office Visits:   09/28/2023 with Dr Jens Som.  Recall 01/31/2024 with Dr Nelly Laurence.    Copy of ICM check sent to Dr. Nelly Laurence.    3 month ICM trend: 08/02/2023.    12-14 Month ICM trend:     Karie Soda, RN 08/03/2023 11:07 AM

## 2023-08-05 LAB — CUP PACEART REMOTE DEVICE CHECK
Battery Remaining Longevity: 82 mo
Battery Voltage: 2.99 V
Brady Statistic AP VP Percent: 0.04 %
Brady Statistic AP VS Percent: 38.73 %
Brady Statistic AS VP Percent: 0.01 %
Brady Statistic AS VS Percent: 61.23 %
Brady Statistic RA Percent Paced: 38.25 %
Brady Statistic RV Percent Paced: 0.05 %
Date Time Interrogation Session: 20241224054346
HighPow Impedance: 70 Ohm
Implantable Lead Connection Status: 753985
Implantable Lead Connection Status: 753985
Implantable Lead Implant Date: 20210622
Implantable Lead Implant Date: 20210622
Implantable Lead Location: 753859
Implantable Lead Location: 753860
Implantable Lead Model: 5076
Implantable Pulse Generator Implant Date: 20210622
Lead Channel Impedance Value: 304 Ohm
Lead Channel Impedance Value: 342 Ohm
Lead Channel Impedance Value: 399 Ohm
Lead Channel Pacing Threshold Amplitude: 0.5 V
Lead Channel Pacing Threshold Amplitude: 0.625 V
Lead Channel Pacing Threshold Pulse Width: 0.4 ms
Lead Channel Pacing Threshold Pulse Width: 0.4 ms
Lead Channel Sensing Intrinsic Amplitude: 3.625 mV
Lead Channel Sensing Intrinsic Amplitude: 3.625 mV
Lead Channel Sensing Intrinsic Amplitude: 6.625 mV
Lead Channel Sensing Intrinsic Amplitude: 6.625 mV
Lead Channel Setting Pacing Amplitude: 1.5 V
Lead Channel Setting Pacing Amplitude: 2.5 V
Lead Channel Setting Pacing Pulse Width: 0.4 ms
Lead Channel Setting Sensing Sensitivity: 0.3 mV
Zone Setting Status: 755011

## 2023-08-18 ENCOUNTER — Ambulatory Visit: Payer: Medicare HMO | Attending: Cardiology | Admitting: *Deleted

## 2023-08-18 DIAGNOSIS — I513 Intracardiac thrombosis, not elsewhere classified: Secondary | ICD-10-CM | POA: Diagnosis not present

## 2023-08-18 DIAGNOSIS — Z5181 Encounter for therapeutic drug level monitoring: Secondary | ICD-10-CM | POA: Diagnosis not present

## 2023-08-18 LAB — POCT INR: INR: 2.4 (ref 2.0–3.0)

## 2023-08-18 NOTE — Patient Instructions (Signed)
 Continue warfarin 1 tablet daily except 1/2 tablet on Tuesdays Continue greens Recheck in 6 wks

## 2023-08-30 ENCOUNTER — Inpatient Hospital Stay: Payer: Medicare HMO | Attending: Hematology

## 2023-08-30 DIAGNOSIS — I509 Heart failure, unspecified: Secondary | ICD-10-CM | POA: Insufficient documentation

## 2023-08-30 DIAGNOSIS — Z801 Family history of malignant neoplasm of trachea, bronchus and lung: Secondary | ICD-10-CM | POA: Diagnosis not present

## 2023-08-30 DIAGNOSIS — Z7901 Long term (current) use of anticoagulants: Secondary | ICD-10-CM | POA: Insufficient documentation

## 2023-08-30 DIAGNOSIS — C8510 Unspecified B-cell lymphoma, unspecified site: Secondary | ICD-10-CM

## 2023-08-30 DIAGNOSIS — Z9221 Personal history of antineoplastic chemotherapy: Secondary | ICD-10-CM | POA: Insufficient documentation

## 2023-08-30 DIAGNOSIS — Z8572 Personal history of non-Hodgkin lymphomas: Secondary | ICD-10-CM | POA: Insufficient documentation

## 2023-08-30 DIAGNOSIS — Z800 Family history of malignant neoplasm of digestive organs: Secondary | ICD-10-CM | POA: Insufficient documentation

## 2023-08-30 DIAGNOSIS — R7989 Other specified abnormal findings of blood chemistry: Secondary | ICD-10-CM | POA: Insufficient documentation

## 2023-08-30 DIAGNOSIS — Z87891 Personal history of nicotine dependence: Secondary | ICD-10-CM | POA: Insufficient documentation

## 2023-08-30 DIAGNOSIS — I255 Ischemic cardiomyopathy: Secondary | ICD-10-CM | POA: Diagnosis not present

## 2023-08-30 DIAGNOSIS — Z95810 Presence of automatic (implantable) cardiac defibrillator: Secondary | ICD-10-CM | POA: Diagnosis not present

## 2023-08-30 LAB — CBC WITH DIFFERENTIAL/PLATELET
Abs Immature Granulocytes: 0.01 10*3/uL (ref 0.00–0.07)
Basophils Absolute: 0.1 10*3/uL (ref 0.0–0.1)
Basophils Relative: 1 %
Eosinophils Absolute: 0.2 10*3/uL (ref 0.0–0.5)
Eosinophils Relative: 3 %
HCT: 45.6 % (ref 39.0–52.0)
Hemoglobin: 15.1 g/dL (ref 13.0–17.0)
Immature Granulocytes: 0 %
Lymphocytes Relative: 29 %
Lymphs Abs: 1.6 10*3/uL (ref 0.7–4.0)
MCH: 31.7 pg (ref 26.0–34.0)
MCHC: 33.1 g/dL (ref 30.0–36.0)
MCV: 95.8 fL (ref 80.0–100.0)
Monocytes Absolute: 0.5 10*3/uL (ref 0.1–1.0)
Monocytes Relative: 9 %
Neutro Abs: 3.2 10*3/uL (ref 1.7–7.7)
Neutrophils Relative %: 58 %
Platelets: 119 10*3/uL — ABNORMAL LOW (ref 150–400)
RBC: 4.76 MIL/uL (ref 4.22–5.81)
RDW: 13.7 % (ref 11.5–15.5)
WBC: 5.6 10*3/uL (ref 4.0–10.5)
nRBC: 0 % (ref 0.0–0.2)

## 2023-08-30 LAB — COMPREHENSIVE METABOLIC PANEL
ALT: 20 U/L (ref 0–44)
AST: 23 U/L (ref 15–41)
Albumin: 4.1 g/dL (ref 3.5–5.0)
Alkaline Phosphatase: 56 U/L (ref 38–126)
Anion gap: 10 (ref 5–15)
BUN: 32 mg/dL — ABNORMAL HIGH (ref 8–23)
CO2: 26 mmol/L (ref 22–32)
Calcium: 9.3 mg/dL (ref 8.9–10.3)
Chloride: 99 mmol/L (ref 98–111)
Creatinine, Ser: 1.35 mg/dL — ABNORMAL HIGH (ref 0.61–1.24)
GFR, Estimated: 54 mL/min — ABNORMAL LOW (ref >60)
Glucose, Bld: 106 mg/dL — ABNORMAL HIGH (ref 70–99)
Potassium: 4.1 mmol/L (ref 3.5–5.1)
Sodium: 135 mmol/L (ref 135–145)
Total Bilirubin: 1.1 mg/dL (ref 0.0–1.2)
Total Protein: 7.4 g/dL (ref 6.5–8.1)

## 2023-08-30 LAB — LACTATE DEHYDROGENASE: LDH: 105 U/L (ref 98–192)

## 2023-08-30 MED ORDER — HEPARIN SOD (PORK) LOCK FLUSH 100 UNIT/ML IV SOLN
500.0000 [IU] | Freq: Once | INTRAVENOUS | Status: AC
  Administered 2023-08-30: 500 [IU] via INTRAVENOUS

## 2023-08-30 MED ORDER — SODIUM CHLORIDE 0.9% FLUSH
10.0000 mL | INTRAVENOUS | Status: DC | PRN
  Administered 2023-08-30: 10 mL via INTRAVENOUS

## 2023-08-30 NOTE — Progress Notes (Signed)
[name] [name] presented for Portacath access and flush with labs. Portacath located right chest wall accessed with  H 20 needle. Good blood return present. Portacath flushed with 20ml NS and 500U/28ml Heparin and needle removed intact. No bruising or swelling noted at the site. Procedure tolerated well and without incident.    Discharged from clinic ambulatory in stable condition. Alert and oriented x 3. F/U with [facility] as scheduled.

## 2023-09-05 NOTE — Progress Notes (Signed)
[facility] [address] [place] [address]    Clinic Day:  09/06/2023  Referring physician: Corwin Levins, MD  Patient Care Team: Corwin Levins, MD as PCP - General Jens Som Madolyn Frieze, MD as PCP - Cardiology (Cardiology) Mealor, Roberts Gaudy, MD as PCP - Electrophysiology (Cardiology) Doreatha Massed, MD as Medical Oncologist (Medical Oncology) Therese Sarah, RN as Oncology Nurse Navigator (Medical Oncology)   ASSESSMENT & PLAN:   Assessment: 1. Stage IVb high-grade B-cell lymphoma: - He had unintentional weight loss of 20 pounds since September 2022.  Weight has been stable for the last 2 months.  He had multiple family members with pancreatic cancer. - CT AP with contrast (10/31/2021): Abnormal nodular thickening of the right anterior pararenal fascia, suspicious for atypical malignancy.  No evidence of pancreatic malignancy.  Irregular wall thickening of the proximal stomach.  No ascites or peritoneal nodularity. - Biopsy of nodular thickening (11/17/2021): High-grade B-cell lymphoma with Burkitt-like features.  Ki-67 100%. 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  Neoplastic lymphocytes positive for CD20, CD10, Bcl-2, BCL6 and mum 1.  Ki-67 more than 95%.  H pylori IHC negative. - High-grade lymphoma panel (11/17/2021): Negative for BCL6, MYC, BCL2 rearrangements, negative for MYC amplification, t(8:14) not detected. - High risk features for CNS disease: Age more than 60, stage III/IV, extranodal involvement more than 1 site.  Based on 3 points, intermediate risk.  No clinical signs of CNS involvement. - IPI: High intermediate with 3 points (age more than 60, stage III/IV, extranodal involvement more than 1 site) - 6 cycles of R-CEOP from 12/15/2021 through 03/31/2022 - PET scan (04/30/2022): No evidence of lymphoma (Deauville 1 response).   2. Social/family history: - He  has AICD placed in June 2021 secondary to CHF. - He is a retired Cytogeneticist.  He worked at [employer] in [place].  He had exposure to some chemicals.  He quit smoking more than 40 years ago.  He drinks mostly beer and occasionally whiskey at bedtime.   3. Ischemic cardiomyopathy/CHF/apical thrombus: - He is on Coumadin.  Will check PT/INR today. - Last seen by Dr. Jens Som on 11/06/2021.   -Last echo on 12/27/2020 with EF 30-35%.  Limited visualization of endocardium.  LV has moderately decreased function.    Plan: Stage IVb high-grade B-cell lymphoma: - He does not have any weight loss or fevers.  He does report occasional night sweats once a week or 2 for the last couple of months. - Physical exam today did not reveal any palpable adenopathy or splenomegaly. - Labs from 08/30/2023: Creatinine mildly elevated and stable.  LDH is normal.  Platelet count is mildly low.  Rest of the CBC was normal. - Recommend follow-up in 6 months with repeat labs.  He will call us if the night sweats get any worse.   2.  Elevated creatinine: - Mildly elevated creatinine stable at 1.35.    Orders Placed This Encounter  Procedures   CBC with Differential    Standing Status:   Future    Expected Date:   03/06/2024    Expiration Date:   09/05/2024   Comprehensive metabolic panel    Standing Status:   Future    Expected Date:   03/06/2024    Expiration Date:   09/05/2024   Lactate dehydrogenase    Standing Status:   Future    Expected Date:   03/06/2024  Expiration Date:   09/05/2024      I,Katie Daubenspeck,acting as a scribe for Doreatha Massed, MD.,have documented all relevant documentation on the behalf of Doreatha Massed, MD,as directed by  Doreatha Massed, MD while in the presence of Doreatha Massed, MD.   I, Doreatha Massed MD, have reviewed the above documentation for accuracy and completeness, and I agree with the above.   Doreatha Massed, MD   1/27/202511:32 AM  CHIEF  COMPLAINT:   Diagnosis: stage IVb high-grade B-cell lymphoma    Cancer Staging  High grade B-cell lymphoma (HCC) Staging form: Hodgkin and Non-Hodgkin Lymphoma, AJCC 8th Edition - Clinical stage from 12/03/2021: Stage IV (Diffuse large B-cell lymphoma) - Unsigned    Prior Therapy: R-CEOP q21d x 6 Cycles, 12/15/21 - 03/31/22  Current Therapy:  surveillance    HISTORY OF PRESENT ILLNESS:   Oncology History  High grade B-cell lymphoma (HCC)  12/03/2021 Initial Diagnosis   High grade B-cell lymphoma (HCC)   12/15/2021 - 04/03/2022 Chemotherapy   Patient is on Treatment Plan : NON-HODGKIN'S LYMPHOMA R-CEOP q21d x 3 Cycles        INTERVAL HISTORY:   Trevor is a 78 y.o. male presenting to clinic today for follow up of stage IVb high-grade B-cell lymphoma. He was last seen by me on 02/25/23.  Today, he states that he is doing well overall. His appetite level is at 100%. His energy level is at 50%.  PAST MEDICAL HISTORY:   Past Medical History: Past Medical History:  Diagnosis Date   AICD (automatic cardioverter/defibrillator) present    ALLERGIC RHINITIS 10/31/2007   Qualifier: Diagnosis of  By: Jonny Ruiz MD, Len Blalock    Allergy    Cancer (HCC) 12/03/2021   non-Hodgkin's B-cell lymphoma   CHOLECYSTECTOMY, HX OF 06/04/2007   Qualifier: Diagnosis of  By: Genelle Gather CMA, [place]     Chronic HFrEF (heart failure with reduced ejection fraction) (HCC)    COLONIC POLYPS, HX OF 10/31/2007   Qualifier: Diagnosis of  By: Jonny Ruiz MD, Len Blalock    CORONARY ARTERY BYPASS GRAFT, HX OF 06/04/2007   Qualifier: Diagnosis of  By: Genelle Gather CMA, [place]     CORONARY ARTERY DISEASE 06/04/2007   Qualifier: Diagnosis of  By: Genelle Gather CMA, [place]     Diverticulosis    Erectile dysfunction    Eye twitch    right eye since chilhood    History of hiatal hernia    HYPERLIPIDEMIA 06/04/2007   Qualifier: Diagnosis of  By: Genelle Gather CMA, [place]     Hypersomnolence    HYPERTENSION 06/04/2007   Qualifier: Diagnosis of  By:  Genelle Gather CMA, [place]     Hypothyroidism    pt does not take medications and doesn't know if he has it   ISCHEMIC CARDIOMYOPATHY 06/04/2007   Qualifier: Diagnosis of  By: Genelle Gather CMA, [place]     Ischemic cardiomyopathy    LV (left ventricular) mural thrombus 02/15/2019   Myocardial infarction [facility])    per patient , his occurred in 1998    Osteoarthritis    Peripheral vascular disease (HCC)    PLMD (periodic limb movement disorder)    Pneumonia    Vertigo    Vitamin D deficiency     Surgical History: Past Surgical History:  Procedure Laterality Date   BIOPSY  11/25/2021   Procedure: BIOPSY;  Surgeon: Lynann Bologna, MD;  Location: [facility];  Service: Gastroenterology;;   CABG  1999   [facility] [place], [place].  Patient reports that a small vein was taken from under his arm and a small incesion was made.   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 05/20/2022   Procedure: LIGATION OF POPLITEAL ARTERY ANEURYSM;  Surgeon: Cephus Shelling, MD;  Location[facility] OR;  Service: Vascular;  Laterality: Right;   ESOPHAGOGASTRODUODENOSCOPY (EGD) WITH PROPOFOL N/A 11/25/2021   Procedure: ESOPHAGOGASTRODUODENOSCOPY (EGD) WITH PROPOFOL;  Surgeon: Lynann Bologna, MD;  Location: [facility];  Service: Gastroenterology;  Laterality: N/A;   FEMORAL-POPLITEAL BYPASS GRAFT Right 05/20/2022   Procedure: RIGHT ABOVE KNEE POPLITEAL ARTERY TO TIBIOPERONEAL TRUNK/POSTERIOR TIBIAL ARTERY BYPASS;  Surgeon: Cephus Shelling, MD;  Location: [facility] OR;  Service: Vascular;  Laterality: Right;   ICD IMPLANT N/A 01/30/2020   Procedure: ICD IMPLANT;  Surgeon: Hillis Range, MD;  Location: [facility] INVASIVE CV LAB;  Service: Cardiovascular;  Laterality: N/A;    INCISION AND DRAINAGE Right 01/08/2023   Procedure: INCISION AND DRAINAGE OF RIGHT LEG  SEROMA, DRAIN PLACEMENT AND MYRIAD MORCELLS;  Surgeon: Cephus Shelling, MD;  Location: [facility] OR;  Service: Vascular;  Laterality: Right;   IR IMAGING GUIDED PORT INSERTION  12/04/2021   KNEE ARTHROPLASTY Left 08/05/2017   Procedure: LEFT TOTAL KNEE ARTHROPLASTY WITH COMPUTER NAVIGATION;  Surgeon: Samson Frederic, MD;  Location: [facility] ORS;  Service: Orthopedics;  Laterality: Left;  Needs RNFA   LIGATION OF CILIAC ARTERY Right 05/20/2022   Procedure: LIGATION OF RIGHT ANTERIOR TIBIAL ARTERY;  Surgeon: Cephus Shelling, MD;  Location: [facility] OR;  Service: Vascular;  Laterality: Right;   LOWER EXTREMITY ANGIOGRAPHY Right 05/14/2022   Procedure: Lower Extremity Angiography;  Surgeon: Cephus Shelling, MD;  Location: [facility] INVASIVE CV LAB;  Service: Cardiovascular;  Laterality: Right;   PENILE PROSTHESIS IMPLANT     POLYPECTOMY  11/25/2021   Procedure: POLYPECTOMY;  Surgeon: Lynann Bologna, MD;  Location: [facility];  Service: Gastroenterology;;   REPLACEMENT TOTAL KNEE  06/12/2013   spinal cyst     THORACOTOMY     left anterior; wound exploration and debridement   TONSILLECTOMY AND ADENOIDECTOMY     age 31   VEIN HARVEST Right 05/20/2022   Procedure: RIGHT GREATER SAPHENOUS VEIN HARVEST;  Surgeon: Cephus Shelling, MD;  Location: [facility] OR;  Service: Vascular;  Laterality: Right;    Social History: Social History   Socioeconomic History   Marital status: Married    Spouse name: Kathie Rhodes   Number of children: 3   Years of education: Not on file   Highest education level: Not on file  Occupational History   Occupation: truck Air traffic controller: [employer]  Tobacco Use   Smoking status: Former    Current packs/day: 0.00    Average packs/day: 4.0 packs/day for 17.0 years (68.0 ttl pk-yrs)    Types: Cigarettes    Start date: 05/07/1964    Quit date: 05/07/1981    Years since quitting: 42.3     Passive exposure: Past   Smokeless tobacco: Never   Tobacco comments:    Pt states that he would let most of them "burn" pt states that he used anywhere between 4-5PPD  Vaping Use   Vaping status: Never Used  Substance and Sexual Activity   Alcohol use: Yes    Alcohol/week: 2.0 standard drinks of  alcohol    Types: 2 Cans of beer per week   Drug use: No   Sexual activity: Not on file  Other Topics Concern   Not on file  Social History Narrative   Lives in [place] with spouse   Retired Naval architect   Social Drivers of Health   Financial Resource Strain: Low Risk  (10/29/2022)   Overall Financial Resource Strain (CARDIA)    Difficulty of Paying Living Expenses: Not hard at all  Food Insecurity: No Food Insecurity (10/29/2022)   Hunger Vital Sign    Worried About Running Out of Food in the Last Year: Never true    Ran Out of Food in the Last Year: Never true  Transportation Needs: No Transportation Needs (10/29/2022)   PRAPARE - Administrator, Civil Service (Medical): No    Lack of Transportation (Non-Medical): No  Physical Activity: Inactive (10/29/2022)   Exercise Vital Sign    Days of Exercise per Week: 0 days    Minutes of Exercise per Session: 0 min  Stress: No Stress Concern Present (10/29/2022)   [employer] of Occupational Health - Occupational Stress Questionnaire    Feeling of Stress : Not at all  Social Connections: Socially Integrated (10/29/2022)   Social Connection and Isolation Panel [NHANES]    Frequency of Communication with Friends and Family: More than three times a week    Frequency of Social Gatherings with Friends and Family: More than three times a week    Attends Religious Services: More than 4 times per year    Active Member of [employer] or Organizations: Yes    Attends Engineer, structural: More than 4 times per year    Marital Status: Married  Catering manager Violence: Not At Risk (10/29/2022)   Humiliation, Afraid, Rape, and  Kick questionnaire    Fear of Current or Ex-Partner: No    Emotionally Abused: No    Physically Abused: No    Sexually Abused: No    Family History: Family History  Problem Relation Age of Onset   Stomach cancer Mother        smokes   Lung cancer Father        chewed tobacco   Heart disease Brother        first MI at 17yo, now 68 for transplant list/ ICM   Pancreatic cancer Brother    Colon cancer Maternal Grandfather    COPD Son        was a smoker   Pancreatic cancer Maternal Uncle    Pancreatic cancer Cousin        mat side x 4   Esophageal cancer Neg Hx    Prostate cancer Neg Hx    Rectal cancer Neg Hx     Current Medications:  Current Outpatient Medications:    Ascorbic Acid (VITA-C PO), Take 1,000 mg by mouth daily., Disp: , Rfl:    aspirin EC 81 MG tablet, Take 1 tablet (81 mg total) by mouth daily at 6 (six) AM. Swallow whole., Disp: 30 tablet, Rfl: 12   b complex vitamins capsule, Take 1 capsule by mouth daily., Disp: , Rfl:    cholecalciferol (VITAMIN D3) 25 MCG (1000 UNIT) tablet, Take 1,000 Units by mouth daily., Disp: , Rfl:    empagliflozin (JARDIANCE) 10 MG TABS tablet, Take 5 mg by mouth daily., Disp: , Rfl:    ezetimibe (ZETIA) 10 MG tablet, TAKE 1 TABLET EVERY DAY, Disp: 90 tablet, Rfl: 2  furosemide (LASIX) 20 MG tablet, TAKE 2 TABLETS (40 MG TOTAL) BY MOUTH DAILY., Disp: 180 tablet, Rfl: 1   losartan (COZAAR) 25 MG tablet, Take 0.5 tablets (12.5 mg total) by mouth daily., Disp: 30 tablet, Rfl: 0   meclizine (ANTIVERT) 12.5 MG tablet, Take 1 tablet (12.5 mg total) by mouth 3 (three) times daily as needed for dizziness., Disp: 40 tablet, Rfl: 1   meloxicam (MOBIC) 15 MG tablet, Take 15 mg by mouth daily., Disp: , Rfl:    metoprolol succinate (TOPROL XL) 25 MG 24 hr tablet, Take 1 tablet (25 mg total) by mouth daily., Disp: 90 tablet, Rfl: 3   Multiple Vitamins-Minerals (MULTIVITAMIN WITH MINERALS) tablet, Take 1 tablet by mouth daily. Centrum silver, Disp:  , Rfl:    potassium chloride SA (KLOR-CON M) 20 MEQ tablet, TAKE 1 TABLET EVERY DAY, Disp: 90 tablet, Rfl: 0   rosuvastatin (CRESTOR) 5 MG tablet, TAKE 1 TABLET EVERY OTHER DAY, Disp: 45 tablet, Rfl: 2   warfarin (COUMADIN) 5 MG tablet, TAKE 1 TABLET EVERY DAY EXCEPT TAKE 1 AND 1/2 TABLETS ON MONDAYS OR AS DIRECTED, Disp: 90 tablet, Rfl: 1   guaiFENesin (MUCINEX) 600 MG 12 hr tablet, Take 2 tablets (1,200 mg total) by mouth 2 (two) times daily as needed. (Patient not taking: Reported on 09/06/2023), Disp: 60 tablet, Rfl: 2   Allergies: Allergies  Allergen Reactions   Ace Inhibitors Cough   Statins Other (See Comments)    Muscle pains    REVIEW OF SYSTEMS:   Review of Systems  Constitutional:  Negative for chills, fatigue and fever.  HENT:   Negative for lump/mass, mouth sores, nosebleeds, sore throat and trouble swallowing.   Eyes:  Negative for eye problems.  Respiratory:  Positive for shortness of breath. Negative for cough.   Cardiovascular:  Positive for palpitations. Negative for chest pain and leg swelling.  Gastrointestinal:  Negative for abdominal pain, constipation, diarrhea, nausea and vomiting.  Genitourinary:  Negative for bladder incontinence, difficulty urinating, dysuria, frequency, hematuria and nocturia.   Musculoskeletal:  Negative for arthralgias, back pain, flank pain, myalgias and neck pain.  Skin:  Negative for itching and rash.  Neurological:  Positive for dizziness and numbness. Negative for headaches.  Hematological:  Does not bruise/bleed easily.  Psychiatric/Behavioral:  Positive for sleep disturbance. Negative for depression and suicidal ideas. The patient is not nervous/anxious.   All other systems reviewed and are negative.    VITALS:   Blood pressure 122/73, pulse 62, temperature 97.8 F (36.6 C), temperature source Oral, resp. rate 16, weight 207 lb 3.7 oz (94 kg), SpO2 94%.  Wt Readings from Last 3 Encounters:  09/06/23 207 lb 3.7 oz (94 kg)   07/20/23 199 lb 6.4 oz (90.4 kg)  05/18/23 190 lb (86.2 kg)    Body mass index is 28.9 kg/m.  Performance status (ECOG): 1 - Symptomatic but completely ambulatory  PHYSICAL EXAM:   Physical Exam Vitals and nursing note reviewed. Exam conducted with a chaperone present.  Constitutional:      Appearance: Normal appearance.  Cardiovascular:     Rate and Rhythm: Normal rate and regular rhythm.     Pulses: Normal pulses.     Heart sounds: Normal heart sounds.  Pulmonary:     Effort: Pulmonary effort is normal.     Breath sounds: Normal breath sounds.  Abdominal:     Palpations: Abdomen is soft. There is no hepatomegaly, splenomegaly or mass.     Tenderness: There is no abdominal  tenderness.  Musculoskeletal:     Right lower leg: No edema.     Left lower leg: No edema.  Lymphadenopathy:     Cervical: No cervical adenopathy.     Right cervical: No superficial, deep or posterior cervical adenopathy.    Left cervical: No superficial, deep or posterior cervical adenopathy.     Upper Body:     Right upper body: No supraclavicular or axillary adenopathy.     Left upper body: No supraclavicular or axillary adenopathy.  Neurological:     General: No focal deficit present.     Mental Status: He is alert and oriented to person, place, and time.  Psychiatric:        Mood and Affect: Mood normal.        Behavior: Behavior normal.     LABS:   CBC     Component Value Date/Time   WBC 5.6 08/30/2023 0948   RBC 4.76 08/30/2023 0948   HGB 15.1 08/30/2023 0948   HGB 15.0 12/18/2020 0901   HCT 45.6 08/30/2023 0948   HCT 44.2 12/18/2020 0901   PLT 119 (L) 08/30/2023 0948   PLT 122 (L) 12/18/2020 0901   MCV 95.8 08/30/2023 0948   MCV 93 12/18/2020 0901   MCH 31.7 08/30/2023 0948   MCHC 33.1 08/30/2023 0948   RDW 13.7 08/30/2023 0948   RDW 12.8 12/18/2020 0901   LYMPHSABS 1.6 08/30/2023 0948   LYMPHSABS 1.4 01/26/2020 0937   MONOABS 0.5 08/30/2023 0948   EOSABS 0.2 08/30/2023  0948   EOSABS 0.2 01/26/2020 0937   BASOSABS 0.1 08/30/2023 0948   BASOSABS 0.0 01/26/2020 0937    CMP      Component Value Date/Time   NA 135 08/30/2023 0948   NA 140 07/30/2022 1142   K 4.1 08/30/2023 0948   CL 99 08/30/2023 0948   CO2 26 08/30/2023 0948   GLUCOSE 106 (H) 08/30/2023 0948   BUN 32 (H) 08/30/2023 0948   BUN 26 07/30/2022 1142   CREATININE 1.35 (H) 08/30/2023 0948   CREATININE 1.02 11/06/2016 1255   CALCIUM 9.3 08/30/2023 0948   PROT 7.4 08/30/2023 0948   PROT 6.7 12/13/2019 1010   ALBUMIN 4.1 08/30/2023 0948   ALBUMIN 4.2 12/13/2019 1010   AST 23 08/30/2023 0948   ALT 20 08/30/2023 0948   ALKPHOS 56 08/30/2023 0948   BILITOT 1.1 08/30/2023 0948   BILITOT 0.8 12/13/2019 1010   GFRNONAA 54 (L) 08/30/2023 0948   GFRAA 73 01/26/2020 0937     No results found for: "CEA1", "CEA" / No results found for: "CEA1", "CEA" No results found for: "PSA1" No results found for: "WGN562" No results found for: "CAN125"  No results found for: "TOTALPROTELP", "ALBUMINELP", "A1GS", "A2GS", "BETS", "BETA2SER", "GAMS", "MSPIKE", "SPEI" Lab Results  Component Value Date   IRONPCTSAT 23.0 03/22/2019   Lab Results  Component Value Date   LDH 105 08/30/2023   LDH 182 12/04/2022   LDH 394 (H) 11/12/2022     STUDIES:   No results found.

## 2023-09-06 ENCOUNTER — Inpatient Hospital Stay: Payer: Medicare HMO | Admitting: Hematology

## 2023-09-06 ENCOUNTER — Ambulatory Visit: Payer: Medicare HMO | Attending: Cardiovascular Disease

## 2023-09-06 VITALS — BP 122/73 | HR 62 | Temp 97.8°F | Resp 16 | Wt 207.2 lb

## 2023-09-06 DIAGNOSIS — Z801 Family history of malignant neoplasm of trachea, bronchus and lung: Secondary | ICD-10-CM | POA: Diagnosis not present

## 2023-09-06 DIAGNOSIS — C8510 Unspecified B-cell lymphoma, unspecified site: Secondary | ICD-10-CM | POA: Diagnosis not present

## 2023-09-06 DIAGNOSIS — Z95810 Presence of automatic (implantable) cardiac defibrillator: Secondary | ICD-10-CM | POA: Diagnosis not present

## 2023-09-06 DIAGNOSIS — Z9221 Personal history of antineoplastic chemotherapy: Secondary | ICD-10-CM | POA: Diagnosis not present

## 2023-09-06 DIAGNOSIS — Z87891 Personal history of nicotine dependence: Secondary | ICD-10-CM | POA: Diagnosis not present

## 2023-09-06 DIAGNOSIS — I255 Ischemic cardiomyopathy: Secondary | ICD-10-CM

## 2023-09-06 DIAGNOSIS — Z8572 Personal history of non-Hodgkin lymphomas: Secondary | ICD-10-CM | POA: Diagnosis not present

## 2023-09-06 DIAGNOSIS — R7989 Other specified abnormal findings of blood chemistry: Secondary | ICD-10-CM | POA: Diagnosis not present

## 2023-09-06 DIAGNOSIS — I509 Heart failure, unspecified: Secondary | ICD-10-CM | POA: Diagnosis not present

## 2023-09-06 DIAGNOSIS — R944 Abnormal results of kidney function studies: Secondary | ICD-10-CM | POA: Diagnosis not present

## 2023-09-06 DIAGNOSIS — Z7901 Long term (current) use of anticoagulants: Secondary | ICD-10-CM

## 2023-09-06 DIAGNOSIS — Z800 Family history of malignant neoplasm of digestive organs: Secondary | ICD-10-CM | POA: Diagnosis not present

## 2023-09-06 DIAGNOSIS — I5022 Chronic systolic (congestive) heart failure: Secondary | ICD-10-CM

## 2023-09-06 NOTE — Patient Instructions (Signed)
[facility] at [facility] Discharge Instructions   You were seen and examined today by Dr. Ellin Saba.  He reviewed the results of your lab work which are normal/stable.   We will see you back in 6 months. We will repeat lab work prior to this visit.    Return as scheduled.    Thank you for choosing [facility] at [facility] to provide your oncology and hematology care.  To afford each patient quality time with our provider, please arrive at least 15 minutes before your scheduled appointment time.   If you have a lab appointment with the Cancer Center please come in thru the Main Entrance and check in at the main information desk.  You need to re-schedule your appointment should you arrive 10 or more minutes late.  We strive to give you quality time with our providers, and arriving late affects you and other patients whose appointments are after yours.  Also, if you no show three or more times for appointments you may be dismissed from the clinic at the providers discretion.     Again, thank you for choosing [facility].  Our hope is that these requests will decrease the amount of time that you wait before being seen by our physicians.       _____________________________________________________________  Should you have questions after your visit to [facility], please contact our office at [phone] and follow the prompts.  Our office hours are 8:00 a.m. and 4:30 p.m. Monday - Friday.  Please note that voicemails left after 4:00 p.m. may not be returned until the following business day.  We are closed weekends and major holidays.  You do have access to a nurse 24-7, just call the main number to the clinic [phone] and do not press any options, hold on the line and a nurse will answer the phone.    For prescription refill requests, have your pharmacy contact our office and allow 72 hours.    Due to Covid, you  will need to wear a mask upon entering the hospital. If you do not have a mask, a mask will be given to you at the Main Entrance upon arrival. For doctor visits, patients may have 1 support person age 87 or older with them. For treatment visits, patients can not have anyone with them due to social distancing guidelines and our immunocompromised population.

## 2023-09-10 ENCOUNTER — Telehealth: Payer: Self-pay

## 2023-09-10 NOTE — Telephone Encounter (Signed)
Remote ICM transmission received.  Attempted call to patient regarding ICM remote transmission and left detailed message per DPR.  Left ICM phone number and advised to return call for any fluid symptoms or questions. Next ICM remote transmission scheduled 10/11/2023.

## 2023-09-10 NOTE — Progress Notes (Signed)
EPIC Encounter for ICM Monitoring  Patient Name: [name] [name] is a 78 y.o. male Date: 09/10/2023 Primary Care Physican: Corwin Levins, MD Primary Cardiologist: Jens Som Electrophysiologist: Mealor 04/20/2022 Not weighing at home 05/07/2022 Weight: 181 lbs (does not weigh consistently at home) 09/29/2022 Office Weight: 190 lbs 11/17/2022 Weight: 185 lbs 04/05/2023 Office Weight: 192 lbs       08/03/2023 Weight: 190 lbs                                             Attempted call to patient and unable to reach.  Left detailed message per DPR regarding transmission.  Transmission results reviewed.     DIET:  Pt uses salt shaker at home and does not follow low salt diet.  Also eats restaurant foods.     Optivol thoracic impedance suggesting normal fluid levels since 08/11/2023.      Prescribed: Furosemide 20 mg take 2 tablet(s) (40 mg total) by mouth daily   Jardiance 10 mg take 1 tablet by mouth daily   Labs: 01/08/2023 Creatinine 1.37, BUN 31, Potassium 3.8, Sodium 137, GFR 53 11/23/2022 Creatinine 1.68, BUN 42, Potassium 3.9, Sodium 135, GFR 42 11/12/2022 Creatinine 1.21, BUN 28, Potassium 3.8, Sodium 137, GFR >60 09/25/2022 Creatinine 1.37, BUN 30, Potassium 3.8, Sodium 3.8, GFR 53 08/13/2022 Creatinine 1.04, BUN 21, Potassium 3.5, Sodium 138  A complete set of results can be found in Results Review.   Recommendations:  Left voice mail with ICM number and encouraged to call if experiencing any fluid symptoms.   Follow-up plan: ICM clinic phone appointment on 10/11/2023.   91 day device clinic remote transmission 11/02/2023.     EP/Cardiology Office Visits:   09/28/2023 with Dr Jens Som.  Recall 01/31/2024 with Dr Nelly Laurence.    Copy of ICM check sent to Dr. Nelly Laurence.    3 month ICM trend: 09/06/2023.    12-14 Month ICM trend:     Karie Soda, RN 09/10/2023 1:57 PM

## 2023-09-14 NOTE — Progress Notes (Signed)
HPI: FU CAD; history dates back to 1998 when he had a myocardial infarction. He subsequently underwent minimally invasive LIMA to his LAD. Cardiac MRI July 2020 showed ejection fraction approximately 30% and apical thrombus noted. Mild aortic and mitral regurgitation. Had ICD placed June 2021. Nuclear study June 2022 showed ejection fraction 29% and infarct but no ischemia.  Echocardiogram June 2023 showed ejection fraction 30 to 35%, akinesis of the apex, anteroseptal and inferoseptal walls, moderate left atrial enlargement, mild aortic insufficiency, mild aortic stenosis with aortic valve area 1.53 cm.  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  Patient subsequently underwent right above-the-knee popliteal artery to tibioperoneal trunk/posterior tibial artery bypass with saphenous vein graft and ligation of right popliteal artery aneurysm October 2023.  Chest x-ray October 2024 showed left infrahilar density and chest CT recommended.  Since I last saw him, he does have dyspnea on exertion but no orthopnea, PND, pedal edema, chest pain or syncope.  He does describe some weakness in his legs.  Current Outpatient Medications  Medication Sig Dispense Refill   Ascorbic Acid (VITA-C PO) Take 1,000 mg by mouth daily.     aspirin EC 81 MG tablet Take 1 tablet (81 mg total) by mouth daily at 6 (six) AM. Swallow whole. 30 tablet 12   b complex vitamins capsule Take 1 capsule by mouth daily.     cholecalciferol (VITAMIN D3) 25 MCG (1000 UNIT) tablet Take 1,000 Units by mouth daily.     empagliflozin (JARDIANCE) 10 MG TABS tablet Take 5 mg by mouth daily.     ezetimibe (ZETIA) 10 MG tablet TAKE 1 TABLET EVERY DAY 90 tablet 2   furosemide (LASIX) 20 MG tablet TAKE 2 TABLETS (40 MG TOTAL) BY MOUTH DAILY. 180 tablet 1   guaiFENesin (MUCINEX) 600 MG 12 hr tablet  Take 2 tablets (1,200 mg total) by mouth 2 (two) times daily as needed. 60 tablet 2   losartan (COZAAR) 25 MG tablet Take 0.5 tablets (12.5 mg total) by mouth daily. 30 tablet 0   meclizine (ANTIVERT) 12.5 MG tablet Take 1 tablet (12.5 mg total) by mouth 3 (three) times daily as needed for dizziness. 40 tablet 1   meloxicam (MOBIC) 15 MG tablet Take 15 mg by mouth daily.     metoprolol succinate (TOPROL XL) 25 MG 24 hr tablet Take 1 tablet (25 mg total) by mouth daily. 90 tablet 3   Multiple Vitamins-Minerals (MULTIVITAMIN WITH MINERALS) tablet Take 1 tablet by mouth daily. Centrum silver     rosuvastatin (CRESTOR) 5 MG tablet TAKE 1 TABLET EVERY OTHER DAY 45 tablet 2   warfarin (COUMADIN) 5 MG tablet TAKE 1 TABLET EVERY DAY EXCEPT TAKE 1 AND 1/2 TABLETS ON MONDAYS OR AS DIRECTED 90 tablet 1   potassium chloride SA (KLOR-CON M) 20 MEQ tablet TAKE 1 TABLET EVERY DAY (Patient not taking: Reported on 09/28/2023) 90 tablet 0   No current facility-administered medications for this visit.     Past Medical History:  Diagnosis Date   AICD (automatic cardioverter/defibrillator) present    ALLERGIC RHINITIS 10/31/2007   Qualifier: Diagnosis of  By: Jonny Ruiz MD, Len Blalock    Allergy    Cancer [facility] 12/03/2021   non-Hodgkin's B-cell lymphoma   CHOLECYSTECTOMY, HX OF 06/04/2007   Qualifier: Diagnosis of  By: Genelle Gather CMA, [place]  Chronic HFrEF (heart failure with reduced ejection fraction) (HCC)    COLONIC POLYPS, HX OF 10/31/2007   Qualifier: Diagnosis of  By: Jonny Ruiz MD, Len Blalock    CORONARY ARTERY BYPASS GRAFT, HX OF 06/04/2007   Qualifier: Diagnosis of  By: Genelle Gather CMA, [place]     CORONARY ARTERY DISEASE 06/04/2007   Qualifier: Diagnosis of  By: Genelle Gather CMA, [place]     Diverticulosis    Erectile dysfunction    Eye twitch    right eye since chilhood    History of hiatal hernia    HYPERLIPIDEMIA 06/04/2007   Qualifier: Diagnosis of  By: Genelle Gather CMA, [place]     Hypersomnolence    HYPERTENSION  06/04/2007   Qualifier: Diagnosis of  By: Genelle Gather CMA, [place]     Hypothyroidism    pt does not take medications and doesn't know if he has it   ISCHEMIC CARDIOMYOPATHY 06/04/2007   Qualifier: Diagnosis of  By: Genelle Gather CMA, [place]     Ischemic cardiomyopathy    LV (left ventricular) mural thrombus 02/15/2019   Myocardial infarction [facility])    per patient , his occurred in 1998    Osteoarthritis    Peripheral vascular disease (HCC)    PLMD (periodic limb movement disorder)    Pneumonia    Vertigo    Vitamin D deficiency     Past Surgical History:  Procedure Laterality Date   BIOPSY  11/25/2021   Procedure: BIOPSY;  Surgeon: Lynann Bologna, MD;  Location: [facility];  Service: Gastroenterology;;   CABG  1999   [facility] [place], [place].  Patient reports that a small vein was taken from under his arm and a small incesion was made.   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 05/20/2022   Procedure: LIGATION OF POPLITEAL ARTERY ANEURYSM;  Surgeon: Cephus Shelling, MD;  Location: [facility] OR;  Service: Vascular;  Laterality: Right;   ESOPHAGOGASTRODUODENOSCOPY (EGD) WITH PROPOFOL N/A 11/25/2021   Procedure: ESOPHAGOGASTRODUODENOSCOPY (EGD) WITH PROPOFOL;  Surgeon: Lynann Bologna, MD;  Location: [facility];  Service: Gastroenterology;  Laterality: N/A;   FEMORAL-POPLITEAL BYPASS GRAFT Right 05/20/2022   Procedure: RIGHT ABOVE KNEE POPLITEAL ARTERY TO TIBIOPERONEAL TRUNK/POSTERIOR TIBIAL ARTERY BYPASS;  Surgeon: Cephus Shelling, MD;  Location: [facility] OR;  Service: Vascular;  Laterality: Right;   ICD IMPLANT N/A 01/30/2020   Procedure: ICD IMPLANT;  Surgeon: Hillis Range, MD;  Location: [facility];  Service:  Cardiovascular;  Laterality: N/A;   INCISION AND DRAINAGE Right 01/08/2023   Procedure: INCISION AND DRAINAGE OF RIGHT LEG  SEROMA, DRAIN PLACEMENT AND MYRIAD MORCELLS;  Surgeon: Cephus Shelling, MD;  Location: [facility] OR;  Service: Vascular;  Laterality: Right;   IR IMAGING GUIDED PORT INSERTION  12/04/2021   KNEE ARTHROPLASTY Left 08/05/2017   Procedure: LEFT TOTAL KNEE ARTHROPLASTY WITH COMPUTER NAVIGATION;  Surgeon: Samson Frederic, MD;  Location: [facility];  Service: Orthopedics;  Laterality: Left;  Needs RNFA   LIGATION OF CILIAC ARTERY Right 05/20/2022   Procedure: LIGATION OF RIGHT ANTERIOR TIBIAL ARTERY;  Surgeon: Cephus Shelling, MD;  Location: [facility] OR;  Service: Vascular;  Laterality: Right;   LOWER EXTREMITY ANGIOGRAPHY Right 05/14/2022   Procedure: Lower Extremity Angiography;  Surgeon: Cephus Shelling, MD;  [facility];  Service: Cardiovascular;  Laterality: Right;   PENILE PROSTHESIS IMPLANT     POLYPECTOMY  11/25/2021   Procedure: POLYPECTOMY;  Surgeon: Lynann Bologna, MD;  Location: [facility];  Service: Gastroenterology;;   REPLACEMENT TOTAL KNEE  06/12/2013   spinal cyst     THORACOTOMY     left anterior; wound exploration and debridement   TONSILLECTOMY AND ADENOIDECTOMY     age 30   VEIN HARVEST Right 05/20/2022   Procedure: RIGHT GREATER SAPHENOUS VEIN HARVEST;  Surgeon: Cephus Shelling, MD;  Location: [facility] OR;  Service: Vascular;  Laterality: Right;    Social History   Socioeconomic History   Marital status: Married    Spouse name: Kathie Rhodes   Number of children: 3   Years of education: Not on file   Highest education level: Not on file  Occupational History   Occupation: truck Air traffic controller: [employer]  Tobacco Use   Smoking status: Former    Current packs/day: 0.00    Average packs/day: 4.0 packs/day for 17.0 years (68.0 ttl pk-yrs)    Types: Cigarettes    Start date: 05/07/1964    Quit date: 05/07/1981    Years since  quitting: 42.4    Passive exposure: Past   Smokeless tobacco: Never   Tobacco comments:    Pt states that he would let most of them "burn" pt states that he used anywhere between 4-5PPD  Vaping Use   Vaping status: Never Used  Substance and Sexual Activity   Alcohol use: Yes    Alcohol/week: 2.0 standard drinks of alcohol    Types: 2 Cans of beer per week   Drug use: No   Sexual activity: Not on file  Other Topics Concern   Not on file  Social History Narrative   Lives in [place] with spouse   Retired Naval architect   Social Drivers of Corporate investment banker Strain: Low Risk  (10/29/2022)   Overall Financial Resource Strain (CARDIA)    Difficulty of Paying Living Expenses: Not hard at all  Food Insecurity: No Food Insecurity (10/29/2022)   Hunger Vital Sign    Worried About Running Out of Food in the Last Year: Never true    Ran Out of Food in the Last Year: Never true  Transportation Needs: No Transportation Needs (10/29/2022)   PRAPARE - Administrator, Civil Service (Medical): No    Lack of Transportation (Non-Medical): No  Physical Activity: Inactive (10/29/2022)   Exercise Vital Sign    Days of Exercise per Week: 0 days    Minutes of Exercise per Session: 0 min  Stress: No Stress Concern Present (10/29/2022)   [employer] of Occupational Health - Occupational Stress Questionnaire    Feeling of Stress : Not at all  Social Connections: Socially Integrated (10/29/2022)   Social Connection and Isolation Panel [NHANES]    Frequency of Communication with Friends and Family: More than three times a week    Frequency of Social Gatherings with Friends and Family: More than three times a week    Attends Religious Services: More than 4 times per year    Active Member of [employer] or Organizations: Yes    Attends Banker Meetings: More than 4 times per year    Marital Status: Married  Catering manager Violence: Not At Risk (10/29/2022)    Humiliation, Afraid, Rape, and Kick questionnaire    Fear of Current or Ex-Partner:  No    Emotionally Abused: No    Physically Abused: No    Sexually Abused: No    Family History  Problem Relation Age of Onset   Stomach cancer Mother        smokes   Lung cancer Father        chewed tobacco   Heart disease Brother        first MI at 106yo, now 69 for transplant list/ ICM   Pancreatic cancer Brother    Colon cancer Maternal Grandfather    COPD Son        was a smoker   Pancreatic cancer Maternal Uncle    Pancreatic cancer Cousin        mat side x 4   Esophageal cancer Neg Hx    Prostate cancer Neg Hx    Rectal cancer Neg Hx     ROS: no fevers or chills, productive cough, hemoptysis, dysphasia, odynophagia, melena, hematochezia, dysuria, hematuria, rash, seizure activity, orthopnea, PND, pedal edema, claudication. Remaining systems are negative.  Physical Exam: Well-developed well-nourished in no acute distress.  Skin is warm and dry.  HEENT is normal.  Neck is supple.  Chest is clear to auscultation with normal expansion.  Cardiovascular exam is regular rate and rhythm.  Abdominal exam nontender or distended. No masses palpated. Extremities show no edema. neuro grossly intact  A/P  1 coronary artery disease-patient doing well with no chest pain.  Continue statin.    2 ischemic cardiomyopathy/congestive heart failure-patient did not tolerate Entresto.  Continue losartan at low-dose as well as beta-blocker (note he had bradycardia on higher doses of metoprolol).  He did not tolerate spironolactone due to breast pain.  Continue jardiance.  Continue diuretic at present dose.  3 hypertension-patient's blood pressure is controlled.  Continue present medications.  4 hyperlipidemia-continue statin.  5 apical thrombus-continue Coumadin.  6 ICD-Per EP.  7 aortic stenosis-mild on most recent echocardiogram.  Plan follow-up echocardiogram June 2025.  8 peripheral vascular  disease-continue statin.  Patient is followed by vascular surgery.  Olga Millers, MD

## 2023-09-15 NOTE — Progress Notes (Signed)
 Remote ICD transmission.

## 2023-09-28 ENCOUNTER — Encounter: Payer: Self-pay | Admitting: Cardiology

## 2023-09-28 ENCOUNTER — Ambulatory Visit: Payer: Medicare HMO | Attending: Cardiology | Admitting: Cardiology

## 2023-09-28 VITALS — BP 122/70 | HR 64 | Ht 71.0 in | Wt 203.0 lb

## 2023-09-28 DIAGNOSIS — I739 Peripheral vascular disease, unspecified: Secondary | ICD-10-CM | POA: Diagnosis not present

## 2023-09-28 DIAGNOSIS — I10 Essential (primary) hypertension: Secondary | ICD-10-CM | POA: Diagnosis not present

## 2023-09-28 DIAGNOSIS — Z95810 Presence of automatic (implantable) cardiac defibrillator: Secondary | ICD-10-CM | POA: Diagnosis not present

## 2023-09-28 DIAGNOSIS — E785 Hyperlipidemia, unspecified: Secondary | ICD-10-CM

## 2023-09-28 DIAGNOSIS — I255 Ischemic cardiomyopathy: Secondary | ICD-10-CM

## 2023-09-28 DIAGNOSIS — R911 Solitary pulmonary nodule: Secondary | ICD-10-CM

## 2023-09-28 DIAGNOSIS — I513 Intracardiac thrombosis, not elsewhere classified: Secondary | ICD-10-CM | POA: Diagnosis not present

## 2023-09-28 DIAGNOSIS — I5022 Chronic systolic (congestive) heart failure: Secondary | ICD-10-CM | POA: Diagnosis not present

## 2023-09-28 DIAGNOSIS — I2510 Atherosclerotic heart disease of native coronary artery without angina pectoris: Secondary | ICD-10-CM

## 2023-09-28 NOTE — Patient Instructions (Addendum)
    Testing/Procedures:  Your physician has requested that you have an echocardiogram. Echocardiography is a painless test that uses sound waves to create images of your heart. It provides your doctor with information about the size and shape of your heart and how well your heart's chambers and valves are working. This procedure takes approximately one hour. There are no restrictions for this procedure. Please do NOT wear cologne, perfume, aftershave, or lotions (deodorant is allowed). Please arrive 15 minutes prior to your appointment time.  Please note: We ask at that you not bring children with you during ultrasound (echo/ vascular) testing. Due to room size and safety concerns, children are not allowed in the ultrasound rooms during exams. Our front office staff cannot provide observation of children in our lobby area while testing is being conducted. An adult accompanying a patient to their appointment will only be allowed in the ultrasound room at the discretion of the ultrasound technician under special circumstances. We apologize for any inconvenience. [facility]-SCHEDU[facility] FOR JUNE   CT OF CHEST WO CONTRAST AT [facility] TO FOLLOW UP NODULE   Follow-Up: At [facility], you and your health needs are our priority.  As part of our continuing mission to provide you with exceptional heart care, we have created designated Provider Care Teams.  These Care Teams include your primary Cardiologist (physician) and Advanced Practice Providers (APPs -  Physician Assistants and Nurse Practitioners) who all work together to provide you with the care you need, when you need it.  We recommend signing up for the patient portal called "MyChart".  Sign up information is provided on this After Visit Summary.  MyChart is used to connect with patients for Virtual Visits (Telemedicine).  Patients are able to view lab/test results, encounter notes, upcoming appointments, etc.  Non-urgent  messages can be sent to your provider as well.   To learn more about what you can do with MyChart, go to [email or web address].    Your next appointment:   12 month(s)  Provider:   Olga Millers, MD

## 2023-09-29 ENCOUNTER — Ambulatory Visit: Payer: Medicare HMO | Attending: Cardiology | Admitting: *Deleted

## 2023-09-29 DIAGNOSIS — I513 Intracardiac thrombosis, not elsewhere classified: Secondary | ICD-10-CM | POA: Diagnosis not present

## 2023-09-29 DIAGNOSIS — I5022 Chronic systolic (congestive) heart failure: Secondary | ICD-10-CM

## 2023-09-29 DIAGNOSIS — Z5181 Encounter for therapeutic drug level monitoring: Secondary | ICD-10-CM | POA: Diagnosis not present

## 2023-09-29 LAB — POCT INR: INR: 3.2 — AB (ref 2.0–3.0)

## 2023-09-29 NOTE — Patient Instructions (Signed)
Hold warfarin tonight then resume 1 tablet daily except 1/2 tablet on Tuesdays Continue greens Recheck in 4 wks

## 2023-10-06 ENCOUNTER — Telehealth: Payer: Self-pay | Admitting: Cardiology

## 2023-10-06 MED ORDER — EMPAGLIFLOZIN 10 MG PO TABS: 10.0000 mg | ORAL_TABLET | Freq: Every day | ORAL | 3 refills | Status: DC

## 2023-10-06 NOTE — Telephone Encounter (Signed)
*  STAT* If patient is at the pharmacy, call can be transferred to refill team.   1. Which medications need to be refilled? (please list name of each medication and dose if known) Empagliflozin   2. Would you like to learn more about the convenience, safety, & potential cost savings by using the [facility]?     3. Are you open to using the [facility] (Type [facility]. .   4. Which pharmacy/location (including street and city if local pharmacy) is medication to be sent to?[place]  [phone]   5. Do they need a 30 day or 90 day supply? 90 days and refills

## 2023-10-08 ENCOUNTER — Other Ambulatory Visit: Payer: Self-pay | Admitting: Internal Medicine

## 2023-10-08 ENCOUNTER — Other Ambulatory Visit: Payer: Self-pay | Admitting: Cardiology

## 2023-10-11 ENCOUNTER — Ambulatory Visit: Payer: Medicare HMO | Attending: Cardiovascular Disease

## 2023-10-11 DIAGNOSIS — Z95810 Presence of automatic (implantable) cardiac defibrillator: Secondary | ICD-10-CM | POA: Diagnosis not present

## 2023-10-11 DIAGNOSIS — I5022 Chronic systolic (congestive) heart failure: Secondary | ICD-10-CM

## 2023-10-12 ENCOUNTER — Encounter: Payer: Self-pay | Admitting: Cardiology

## 2023-10-12 NOTE — Telephone Encounter (Signed)
Error

## 2023-10-15 ENCOUNTER — Telehealth: Payer: Self-pay

## 2023-10-15 NOTE — Telephone Encounter (Signed)
 Remote ICM transmission received.  Attempted call to patient regarding ICM remote transmission and left detailed message per DPR.  Left ICM phone number and advised to return call for any fluid symptoms or questions. Next ICM remote transmission scheduled 11/15/2023.

## 2023-10-15 NOTE — Progress Notes (Signed)
 EPIC Encounter for ICM Monitoring  Patient Name: [name] [name] is a 78 y.o. male Date: 10/15/2023 Primary Care Physican: Corwin Levins, MD Primary Cardiologist: Jens Som Electrophysiologist: Mealor 04/20/2022 Not weighing at home 05/07/2022 Weight: 181 lbs (does not weigh consistently at home) 09/29/2022 Office Weight: 190 lbs 11/17/2022 Weight: 185 lbs 04/05/2023 Office Weight: 192 lbs       08/03/2023 Weight: 190 lbs                                             Attempted call to patient and unable to reach.  Left detailed message per DPR regarding transmission.  Transmission results reviewed.     DIET:  Pt uses salt shaker at home and does not follow low salt diet.  Also eats restaurant foods.     Optivol thoracic impedance suggesting normal fluid within the last month.      Prescribed: Furosemide 20 mg take 2 tablet(s) (40 mg total) by mouth daily   Jardiance 10 mg take 1 tablet by mouth daily   Labs: 08/30/2023 Creatinine 1.35, BUN 32, Potassium 4.1, Sodium 135, GFR 54  A complete set of results can be found in Results Review.   Recommendations:  Left voice mail with ICM number and encouraged to call if experiencing any fluid symptoms.   Follow-up plan: ICM clinic phone appointment on 11/15/2023.   91 day device clinic remote transmission 11/02/2023.     EP/Cardiology Office Visits:   Recall 09/22/2024 with Dr Jens Som.  Recall 01/31/2024 with Dr Nelly Laurence.    Copy of ICM check sent to Dr. Nelly Laurence.    3 month ICM trend: 10/11/2023.    12-14 Month ICM trend:     Karie Soda, RN 10/15/2023 1:51 PM

## 2023-10-27 ENCOUNTER — Ambulatory Visit: Payer: Medicare HMO | Attending: Cardiology | Admitting: *Deleted

## 2023-10-27 DIAGNOSIS — Z5181 Encounter for therapeutic drug level monitoring: Secondary | ICD-10-CM

## 2023-10-27 DIAGNOSIS — I513 Intracardiac thrombosis, not elsewhere classified: Secondary | ICD-10-CM

## 2023-10-27 LAB — POCT INR: INR: 2.1 (ref 2.0–3.0)

## 2023-10-27 NOTE — Patient Instructions (Signed)
 Continue warfarin 1 tablet daily except 1/2 tablet on Tuesdays Continue greens Recheck in 4 wks

## 2023-11-01 ENCOUNTER — Other Ambulatory Visit: Payer: Self-pay | Admitting: Cardiology

## 2023-11-01 ENCOUNTER — Ambulatory Visit (INDEPENDENT_AMBULATORY_CARE_PROVIDER_SITE_OTHER): Payer: Medicare HMO

## 2023-11-01 VITALS — Ht 70.0 in | Wt 199.0 lb

## 2023-11-01 DIAGNOSIS — E119 Type 2 diabetes mellitus without complications: Secondary | ICD-10-CM | POA: Diagnosis not present

## 2023-11-01 DIAGNOSIS — Z0000 Encounter for general adult medical examination without abnormal findings: Secondary | ICD-10-CM | POA: Diagnosis not present

## 2023-11-01 DIAGNOSIS — Z0100 Encounter for examination of eyes and vision without abnormal findings: Secondary | ICD-10-CM

## 2023-11-01 NOTE — Patient Instructions (Addendum)
 [name] , Thank you for taking time to come for your Medicare Wellness Visit. I appreciate your ongoing commitment to your health goals. Please review the following plan we discussed and let me know if I can assist you in the future.   Referrals/Orders/Follow-Ups/Clinician Recommendations: Aim for 30 minutes of exercise or brisk walking, 6-8 glasses of water, and 5 servings of fruits and vegetables each day. Referral to Dr Donia Ast (Ophthalmologist) for a routine eye exam.    This is a list of the screening recommended for you and due dates:  Health Maintenance  Topic Date Due   COVID-19 Vaccine (4 - 2024-25 season) 04/11/2023   Medicare Annual Wellness Visit  10/31/2024   DTaP/Tdap/Td vaccine (3 - Td or Tdap) 03/21/2029   Pneumonia Vaccine  Completed   Flu Shot  Completed   Hepatitis C Screening  Completed   Zoster (Shingles) Vaccine  Completed   HPV Vaccine  Aged Out   Colon Cancer Screening  Discontinued    Advanced directives: (Provided) Advance directive discussed with you today. I have provided a copy for you to complete at home and have notarized. Once this is complete, please bring a copy in to our office so we can scan it into your chart.   Next Medicare Annual Wellness Visit scheduled for next year: Yes

## 2023-11-01 NOTE — Progress Notes (Signed)
 Subjective:   [name] [name] is a 78 y.o. who presents for a Medicare Wellness preventive visit.  Visit Complete: Virtual I connected with  Tamala Bari on 11/01/23 by a audio enabled telemedicine application and verified that I am speaking with the correct person using two identifiers.  Patient Location: Home  Provider Location: Office/Clinic  I discussed the limitations of evaluation and management by telemedicine. The patient expressed understanding and agreed to proceed.  Vital Signs: Because this visit was a virtual/telehealth visit, some criteria may be missing or patient reported. Any vitals not documented were not able to be obtained and vitals that have been documented are patient reported.  VideoDeclined- This patient declined Librarian, academic. Therefore the visit was completed with audio only.  Persons Participating in Visit: Patient.  AWV Questionnaire: No: Patient Medicare AWV questionnaire was not completed prior to this visit.  Cardiac Risk Factors include: advanced age (>58men, >63 women);hypertension;dyslipidemia;male gender     Objective:    Today's Vitals   11/01/23 1205  Weight: 199 lb (90.3 kg)  Height: 5\' 10"  (1.778 m)   Body mass index is 28.55 kg/m.     11/01/2023   12:02 PM 09/06/2023   11:20 AM 04/02/2023   12:34 PM 02/25/2023    9:56 AM 01/08/2023    7:18 AM 11/19/2022   11:12 AM 11/12/2022   11:28 AM  Advanced Directives  Does Patient Have a Medical Advance Directive? No Yes Yes Yes Yes Yes Yes  Type of Furniture conservator/restorer;Living will Living will Healthcare Power of [place];Living will Living will Living will;Healthcare Power of Attorney   Does patient want to make changes to medical advance directive?  No - Patient declined  No - Patient declined No - Patient declined No - Patient declined   Copy of Healthcare Power of Attorney in Chart?  No - copy requested  No - copy requested   No - copy requested   Would patient like information on creating a medical advance directive? Yes (MAU/Ambulatory/Procedural Areas - Information given) No - Patient declined  No - Patient declined  No - Patient declined     Current Medications (verified) Outpatient Encounter Medications as of 11/01/2023  Medication Sig   Ascorbic Acid (VITA-C PO) Take 1,000 mg by mouth daily.   aspirin EC 81 MG tablet Take 1 tablet (81 mg total) by mouth daily at 6 (six) AM. Swallow whole.   b complex vitamins capsule Take 1 capsule by mouth daily.   cholecalciferol (VITAMIN D3) 25 MCG (1000 UNIT) tablet Take 1,000 Units by mouth daily.   empagliflozin (JARDIANCE) 10 MG TABS tablet Take 1 tablet (10 mg total) by mouth daily.   ezetimibe (ZETIA) 10 MG tablet TAKE 1 TABLET EVERY DAY   furosemide (LASIX) 20 MG tablet TAKE 2 TABLETS EVERY DAY   guaiFENesin (MUCINEX) 600 MG 12 hr tablet Take 2 tablets (1,200 mg total) by mouth 2 (two) times daily as needed.   losartan (COZAAR) 25 MG tablet Take 0.5 tablets (12.5 mg total) by mouth daily.   meclizine (ANTIVERT) 12.5 MG tablet Take 1 tablet (12.5 mg total) by mouth 3 (three) times daily as needed for dizziness.   meloxicam (MOBIC) 15 MG tablet Take 15 mg by mouth daily.   metoprolol succinate (TOPROL XL) 25 MG 24 hr tablet Take 1 tablet (25 mg total) by mouth daily.   Multiple Vitamins-Minerals (MULTIVITAMIN WITH MINERALS) tablet Take 1 tablet by mouth daily. Centrum  silver   potassium chloride SA (KLOR-CON M) 20 MEQ tablet TAKE 1 TABLET EVERY DAY   rosuvastatin (CRESTOR) 5 MG tablet TAKE 1 TABLET EVERY OTHER DAY   warfarin (COUMADIN) 5 MG tablet TAKE 1 TABLET EVERY DAY EXCEPT TAKE 1 AND 1/2 TABLETS ON MONDAYS OR AS DIRECTED   No facility-administered encounter medications on file as of 11/01/2023.    Allergies (verified) Ace inhibitors and Statins   History: Past Medical History:  Diagnosis Date   AICD (automatic cardioverter/defibrillator) present     ALLERGIC RHINITIS 10/31/2007   Qualifier: Diagnosis of  By: Jonny Ruiz MD, Len Blalock    Allergy    Cancer (HCC) 12/03/2021   non-Hodgkin's B-cell lymphoma   CHOLECYSTECTOMY, HX OF 06/04/2007   Qualifier: Diagnosis of  By: Genelle Gather CMA, [place]     Chronic HFrEF (heart failure with reduced ejection fraction) (HCC)    COLONIC POLYPS, HX OF 10/31/2007   Qualifier: Diagnosis of  By: Jonny Ruiz MD, Len Blalock    CORONARY ARTERY BYPASS GRAFT, HX OF 06/04/2007   Qualifier: Diagnosis of  By: Genelle Gather CMA, [place]     CORONARY ARTERY DISEASE 06/04/2007   Qualifier: Diagnosis of  By: Genelle Gather CMA, [place]     Diverticulosis    Erectile dysfunction    Eye twitch    right eye since chilhood    History of hiatal hernia    HYPERLIPIDEMIA 06/04/2007   Qualifier: Diagnosis of  By: Genelle Gather CMA, [place]     Hypersomnolence    HYPERTENSION 06/04/2007   Qualifier: Diagnosis of  By: Genelle Gather CMA, [place]     Hypothyroidism    pt does not take medications and doesn't know if he has it   ISCHEMIC CARDIOMYOPATHY 06/04/2007   Qualifier: Diagnosis of  By: Genelle Gather CMA, [place]     Ischemic cardiomyopathy    LV (left ventricular) mural thrombus 02/15/2019   Myocardial infarction [facility])    per patient , his occurred in 1998    Osteoarthritis    Peripheral vascular disease (HCC)    PLMD (periodic limb movement disorder)    Pneumonia    Vertigo    Vitamin D deficiency    Past Surgical History:  Procedure Laterality Date   BIOPSY  11/25/2021   Procedure: BIOPSY;  Surgeon: Lynann Bologna, MD;  Location: [facility];  Service: Gastroenterology;;   CABG  1999   [facility] [place], [place].  Patient reports that a small vein was taken from under his arm and a small incesion was made.   CHOLECYSTECTOMY     COLONOSCOPY     COLONOSCOPY WITH PROPOFOL N/A 11/25/2021   Procedure: COLONOSCOPY WITH PROPOFOL;  Surgeon: Lynann Bologna, MD;  Location: [facility];  Service: Gastroenterology;  Laterality: N/A;    ENDOVASCULAR REPAIR OF POPLITEAL ARTERY ANEURYSM Right 05/20/2022   Procedure: LIGATION OF POPLITEAL ARTERY ANEURYSM;  Surgeon: Cephus Shelling, MD;  Location[facility] OR;  Service: Vascular;  Laterality: Right;   ESOPHAGOGASTRODUODENOSCOPY (EGD) WITH PROPOFOL N/A 11/25/2021   Procedure: ESOPHAGOGASTRODUODENOSCOPY (EGD) WITH PROPOFOL;  Surgeon: Lynann Bologna, MD;  Location: [facility];  Service: Gastroenterology;  Laterality: N/A;   FEMORAL-POPLITEAL BYPASS GRAFT Right 05/20/2022   Procedure: RIGHT ABOVE KNEE POPLITEAL ARTERY TO TIBIOPERONEAL TRUNK/POSTERIOR TIBIAL ARTERY BYPASS;  Surgeon: Cephus Shelling, MD;  Location: [facility] OR;  Service: Vascular;  Laterality: Right;   ICD IMPLANT N/A 01/30/2020   Procedure: ICD IMPLANT;  Surgeon: Hillis Range, MD;  Location: [facility] INVASIVE CV LAB;  Service: Cardiovascular;  Laterality: N/A;   INCISION AND DRAINAGE Right 01/08/2023   Procedure: INCISION AND DRAINAGE OF RIGHT LEG  SEROMA, DRAIN PLACEMENT AND MYRIAD MORCELLS;  Surgeon: Cephus Shelling, MD;  Location: [facility] OR;  Service: Vascular;  Laterality: Right;   IR IMAGING GUIDED PORT INSERTION  12/04/2021   KNEE ARTHROPLASTY Left 08/05/2017   Procedure: LEFT TOTAL KNEE ARTHROPLASTY WITH COMPUTER NAVIGATION;  Surgeon: Samson Frederic, MD;  Location: [facility];  Service: Orthopedics;  Laterality: Left;  Needs RNFA   LIGATION OF CILIAC ARTERY Right 05/20/2022   Procedure: LIGATION OF RIGHT ANTERIOR TIBIAL ARTERY;  Surgeon: Cephus Shelling, MD;  Location: [facility] OR;  Service: Vascular;  Laterality: Right;   LOWER EXTREMITY ANGIOGRAPHY Right 05/14/2022   Procedure: Lower Extremity Angiography;  Surgeon: Cephus Shelling, MD;  Location: [facility] INVASIVE CV LAB;  Service: Cardiovascular;  Laterality: Right;   PENILE PROSTHESIS IMPLANT     POLYPECTOMY  11/25/2021   Procedure: POLYPECTOMY;  Surgeon: Lynann Bologna, MD;  Location: [facility];  Service: Gastroenterology;;   REPLACEMENT TOTAL KNEE  06/12/2013    spinal cyst     THORACOTOMY     left anterior; wound exploration and debridement   TONSILLECTOMY AND ADENOIDECTOMY     age 76   VEIN HARVEST Right 05/20/2022   Procedure: RIGHT GREATER SAPHENOUS VEIN HARVEST;  Surgeon: Cephus Shelling, MD;  Location: [facility] OR;  Service: Vascular;  Laterality: Right;   Family History  Problem Relation Age of Onset   Stomach cancer Mother        smokes   Lung cancer Father        chewed tobacco   Heart disease Brother        first MI at 94yo, now 43 for transplant list/ ICM   Pancreatic cancer Brother    Colon cancer Maternal Grandfather    COPD Son        was a smoker   Pancreatic cancer Maternal Uncle    Pancreatic cancer Cousin        mat side x 4   Esophageal cancer Neg Hx    Prostate cancer Neg Hx    Rectal cancer Neg Hx    Social History   Socioeconomic History   Marital status: Married    Spouse name: Kathie Rhodes   Number of children: 3   Years of education: Not on file   Highest education level: Not on file  Occupational History   Occupation: truck Air traffic controller: [employer]  Tobacco Use   Smoking status: Former    Current packs/day: 0.00    Average packs/day: 4.0 packs/day for 17.0 years (68.0 ttl pk-yrs)    Types: Cigarettes    Start date: 05/07/1964    Quit date: 05/07/1981    Years since quitting: 42.5    Passive exposure: Past   Smokeless tobacco: Never   Tobacco comments:    Pt states that he would let most of them "burn" pt states that he used anywhere between 4-5PPD  Vaping Use   Vaping status: Never Used  Substance and Sexual Activity   Alcohol use: Yes    Alcohol/week: 2.0 standard drinks of alcohol    Types: 2 Cans of beer per week    Comment: occ   Drug use: No   Sexual activity: Not on file  Other Topics Concern   Not on file  Social History Narrative   Lives in [place] with spouse   Retired Naval architect   Social  Drivers of Health   Financial Resource Strain: Low Risk  (11/01/2023)    Overall Financial Resource Strain (CARDIA)    Difficulty of Paying Living Expenses: Not hard at all  Food Insecurity: No Food Insecurity (11/01/2023)   Hunger Vital Sign    Worried About Running Out of Food in the Last Year: Never true    Ran Out of Food in the Last Year: Never true  Transportation Needs: No Transportation Needs (11/01/2023)   PRAPARE - Administrator, Civil Service (Medical): No    Lack of Transportation (Non-Medical): No  Physical Activity: Sufficiently Active (11/01/2023)   Exercise Vital Sign    Days of Exercise per Week: 3 days    Minutes of Exercise per Session: 60 min  Stress: No Stress Concern Present (11/01/2023)   [employer] of Occupational Health - Occupational Stress Questionnaire    Feeling of Stress : Not at all  Social Connections: Moderately Isolated (11/01/2023)   Social Connection and Isolation Panel [NHANES]    Frequency of Communication with Friends and Family: More than three times a week    Frequency of Social Gatherings with Friends and Family: Once a week    Attends Religious Services: Never    Database administrator or Organizations: No    Attends Engineer, structural: Never    Marital Status: Married    Tobacco Counseling Counseling given: No Tobacco comments: Pt states that he would let most of them "burn" pt states that he used anywhere between 4-5PPD    Clinical Intake:  Pre-visit preparation completed: Yes  Pain : No/denies pain     BMI - recorded: 28.55 Nutritional Status: BMI 25 -29 Overweight Nutritional Risks: None Diabetes: No CBG done?: No Did pt. bring in CBG monitor from home?: No  Lab Results  Component Value Date   HGBA1C 5.8 (H) 06/01/2023   HGBA1C 5.8 05/18/2023   HGBA1C 5.2 05/20/2022     How often do you need to have someone help you when you read instructions, pamphlets, or other written materials from your doctor or pharmacy?: 1 - Never  Interpreter Needed?: No  Information  entered by :: Hassell Halim, CMA   Activities of Daily Living     11/01/2023   12:09 PM 01/08/2023    7:17 AM  In your present state of health, do you have any difficulty performing the following activities:  Hearing? 0 0  Vision? 0 0  Difficulty concentrating or making decisions? 0 0  Walking or climbing stairs? 0 0  Dressing or bathing? 0 0  Doing errands, shopping? 0   Preparing Food and eating ? N   Using the Toilet? N   In the past six months, have you accidently leaked urine? N   Do you have problems with loss of bowel control? N   Managing your Medications? N   Managing your Finances? N   Housekeeping or managing your Housekeeping? N     Patient Care Team: Corwin Levins, MD as PCP - General Jens Som Madolyn Frieze, MD as PCP - Cardiology (Cardiology) Mealor, Roberts Gaudy, MD as PCP - Electrophysiology (Cardiology) Doreatha Massed, MD as Medical Oncologist (Medical Oncology) Therese Sarah, RN as Oncology Nurse Navigator (Medical Oncology)  Indicate any recent Medical Services you may have received from other than [facility] providers in the past year (date may be approximate).     Assessment:   This is a routine wellness examination for Stein.  Hearing/Vision screen Hearing Screening -  Comments:: Denies hearing difficulties   Vision Screening - Comments:: Wears rx glasses - up to date with routine eye exams.  New referral placed.   Goals Addressed               This Visit's Progress     Patient Stated (pt-stated)        Patient stated he plans to build energy due to exercising more.       Depression Screen     11/01/2023   12:12 PM 05/18/2023    2:11 PM 04/09/2023    1:48 PM 10/29/2022    2:35 PM 10/27/2021    1:58 PM 10/09/2021   10:41 AM 10/09/2021   10:11 AM  PHQ 2/9 Scores  PHQ - 2 Score 0 0 0 0 0 1 0  PHQ- 9 Score 1   1       Fall Risk     11/01/2023   12:14 PM 05/18/2023    2:11 PM 04/09/2023    1:48 PM 10/29/2022    2:34 PM 10/27/2021    1:55 PM   Fall Risk   Falls in the past year? 0 0 0 0 0  Number falls in past yr: 0 0 0 0 0  Injury with Fall? 0 0 0 0 0  Risk for fall due to : No Fall Risks No Fall Risks No Fall Risks No Fall Risks No Fall Risks  Follow up Falls prevention discussed;Falls evaluation completed Falls evaluation completed Falls evaluation completed Falls prevention discussed Falls evaluation completed    MEDICARE RISK AT HOME:  Medicare Risk at Home Any stairs in or around the home?: Yes (outside) If so, are there any without handrails?: No Home free of loose throw rugs in walkways, pet beds, electrical cords, etc?: Yes Adequate lighting in your home to reduce risk of falls?: Yes Life alert?: No Use of a cane, walker or w/c?: No Grab bars in the bathroom?: No Shower chair or bench in shower?: No Elevated toilet seat or a handicapped toilet?: Yes  TIMED UP AND GO:  Was the test performed?  No  Cognitive Function: 6CIT completed        11/01/2023   12:14 PM 10/29/2022    2:36 PM  6CIT Screen  What Year? 0 points 0 points  What month? 0 points 0 points  What time? 0 points 0 points  Count back from 20 0 points 0 points  Months in reverse 0 points 0 points  Repeat phrase 0 points 0 points  Total Score 0 points 0 points    Immunizations Immunization History  Administered Date(s) Administered   Fluad Quad(high Dose 65+) 05/24/2019, 07/07/2021   Fluad Trivalent(High Dose 65+) 06/15/2018, 05/18/2023   Influenza Split 05/10/2012, 05/07/2015   Influenza Whole 05/10/2008   Influenza, High Dose Seasonal PF 06/09/2010, 08/07/2011, 05/27/2016, 06/22/2017, 06/15/2018, 04/12/2020   Influenza,inj,Quad PF,6+ Mos 05/11/2014   Influenza,inj,quad, With Preservative 05/25/2017   Influenza-Unspecified 05/11/2007, 05/24/2008, 05/31/2009, 05/11/2012, 05/18/2013, 05/10/2014, 05/05/2015, 05/10/2017, 07/11/2019, 04/12/2020, 05/10/2021, 05/10/2022, 06/24/2022   Moderna Sars-Covid-2 Vaccination 10/17/2019, 11/14/2019,  08/01/2020   Pneumococcal Conjugate-13 12/07/2014, 05/14/2015   Pneumococcal Polysaccharide-23 10/31/2007, 12/02/2016, 10/09/2021   Pneumococcal-Unspecified 05/10/2006, 06/09/2010   Td 10/29/2008   Tdap 03/22/2019   Zoster Recombinant(Shingrix) 08/10/2017, 10/08/2017, 11/10/2017, 01/11/2018    Screening Tests Health Maintenance  Topic Date Due   COVID-19 Vaccine (4 - 2024-25 season) 04/11/2023   Medicare Annual Wellness (AWV)  10/31/2024   DTaP/Tdap/Td (3 - Td or Tdap) 03/21/2029  Pneumonia Vaccine 43+ Years old  Completed   INFLUENZA VACCINE  Completed   Hepatitis C Screening  Completed   Zoster Vaccines- Shingrix  Completed   HPV VACCINES  Aged Out   Colonoscopy  Discontinued    Health Maintenance  Health Maintenance Due  Topic Date Due   COVID-19 Vaccine (4 - 2024-25 season) 04/11/2023   Health Maintenance Items Addressed: 11/01/2023 Referral sent to Optometry/Ophthalmology - Dr Ronda Fairly in [place], [place]  Additional Screening:  Vision Screening: Recommended annual ophthalmology exams for early detection of glaucoma and other disorders of the eye.  Dental Screening: Recommended annual dental exams for proper oral hygiene  Community Resource Referral / Chronic Care Management: CRR required this visit?  No   CCM required this visit?  No     Plan:     I have personally reviewed and noted the following in the patient's chart:   Medical and social history Use of alcohol, tobacco or illicit drugs  Current medications and supplements including opioid prescriptions. Patient is not currently taking opioid prescriptions. Functional ability and status Nutritional status Physical activity Advanced directives List of other physicians Hospitalizations, surgeries, and ER visits in previous 12 months Vitals Screenings to include cognitive, depression, and falls Referrals and appointments  In addition, I have reviewed and discussed with patient certain preventive  protocols, quality metrics, and best practice recommendations. A written personalized care plan for preventive services as well as general preventive health recommendations were provided to patient.     Darreld Mclean, CMA   11/01/2023   After Visit Summary: (MyChart) Due to this being a telephonic visit, the after visit summary with patients personalized plan was offered to patient via MyChart   Notes: Please refer to Routing Comments.

## 2023-11-02 ENCOUNTER — Telehealth: Payer: Self-pay

## 2023-11-02 ENCOUNTER — Ambulatory Visit: Payer: Medicare HMO

## 2023-11-02 DIAGNOSIS — I255 Ischemic cardiomyopathy: Secondary | ICD-10-CM

## 2023-11-02 LAB — CUP PACEART REMOTE DEVICE CHECK
Battery Remaining Longevity: 81 mo
Battery Voltage: 2.99 V
Brady Statistic AP VP Percent: 0.01 %
Brady Statistic AP VS Percent: 27.7 %
Brady Statistic AS VP Percent: 0.03 %
Brady Statistic AS VS Percent: 72.26 %
Brady Statistic RA Percent Paced: 27.43 %
Brady Statistic RV Percent Paced: 0.04 %
Date Time Interrogation Session: 20250325063426
HighPow Impedance: 65 Ohm
Implantable Lead Connection Status: 753985
Implantable Lead Connection Status: 753985
Implantable Lead Implant Date: 20210622
Implantable Lead Implant Date: 20210622
Implantable Lead Location: 753859
Implantable Lead Location: 753860
Implantable Lead Model: 5076
Implantable Pulse Generator Implant Date: 20210622
Lead Channel Impedance Value: 342 Ohm
Lead Channel Impedance Value: 361 Ohm
Lead Channel Impedance Value: 418 Ohm
Lead Channel Pacing Threshold Amplitude: 0.5 V
Lead Channel Pacing Threshold Amplitude: 0.625 V
Lead Channel Pacing Threshold Pulse Width: 0.4 ms
Lead Channel Pacing Threshold Pulse Width: 0.4 ms
Lead Channel Sensing Intrinsic Amplitude: 3 mV
Lead Channel Sensing Intrinsic Amplitude: 3 mV
Lead Channel Sensing Intrinsic Amplitude: 8.5 mV
Lead Channel Sensing Intrinsic Amplitude: 8.5 mV
Lead Channel Setting Pacing Amplitude: 1.5 V
Lead Channel Setting Pacing Amplitude: 2.5 V
Lead Channel Setting Pacing Pulse Width: 0.4 ms
Lead Channel Setting Sensing Sensitivity: 0.3 mV
Zone Setting Status: 755011

## 2023-11-02 NOTE — Telephone Encounter (Signed)
 Returned patient call as requested by voice mail message.  He asked if he should be taking 2 cholesterol meds, Zetia prescribed by Dr Jens Som and Rosuvastatin prescribed by Dr Nelly Laurence.  Advised to send my chart message to ask Dr Jens Som for that question.  He stated he would do so to confirm he should be taking both.

## 2023-11-13 ENCOUNTER — Encounter: Payer: Self-pay | Admitting: Cardiology

## 2023-11-15 ENCOUNTER — Ambulatory Visit: Attending: Cardiovascular Disease

## 2023-11-15 DIAGNOSIS — I5022 Chronic systolic (congestive) heart failure: Secondary | ICD-10-CM

## 2023-11-15 DIAGNOSIS — Z95810 Presence of automatic (implantable) cardiac defibrillator: Secondary | ICD-10-CM | POA: Diagnosis not present

## 2023-11-15 NOTE — Telephone Encounter (Signed)
 Patient identification verified by 2 forms. Marilynn Rail, RN    Called and spoke to patient  Patient states:   -he is having increased weakness in legs   -he feels like he does not have energy   -he is also having some dizziness   -PCP provided meclizine for dizziness does not see much improvement  -unable to complete work/activities, has to stop frequently due to weakness in legs   -symptoms worsened 2-3 months ago   -unsure if weakness is related to Jardiance   -in the past he has had issues with Jardiance, at that time dose was reduced   -he takes Ezetimibe and Rosuvastin for cholesterol   -unsure if symptoms related to cholesterol medications   -does not check BP regularly at home  -has not seen PCP regarding concern, has OV with PCP on 4/9, will discuss symptoms at visit   -would like cardiology input regarding symptoms Patient denies:   -Muscle pain   -new numbness/tingling   -SOB/difficulty breathing   -Heart palpitations   -Swelling legs  Informed patient message sent to Dr. Jens Som for input/advisement  Patient verbalized understanding, no questions at this time

## 2023-11-16 ENCOUNTER — Other Ambulatory Visit (HOSPITAL_COMMUNITY)
Admission: RE | Admit: 2023-11-16 | Discharge: 2023-11-16 | Disposition: A | Discharge status: Home / Self Care | Attending: Nephrology | Admitting: Nephrology

## 2023-11-16 DIAGNOSIS — N1831 Chronic kidney disease, stage 3a: Secondary | ICD-10-CM | POA: Diagnosis not present

## 2023-11-16 DIAGNOSIS — I701 Atherosclerosis of renal artery: Secondary | ICD-10-CM | POA: Diagnosis not present

## 2023-11-16 DIAGNOSIS — I130 Hypertensive heart and chronic kidney disease with heart failure and stage 1 through stage 4 chronic kidney disease, or unspecified chronic kidney disease: Secondary | ICD-10-CM | POA: Insufficient documentation

## 2023-11-16 DIAGNOSIS — C8510 Unspecified B-cell lymphoma, unspecified site: Secondary | ICD-10-CM | POA: Diagnosis not present

## 2023-11-16 DIAGNOSIS — I5022 Chronic systolic (congestive) heart failure: Secondary | ICD-10-CM | POA: Diagnosis not present

## 2023-11-16 LAB — HEPATITIS C ANTIBODY: HCV Ab: NONREACTIVE

## 2023-11-16 LAB — CBC
HCT: 48 % (ref 39.0–52.0)
Hemoglobin: 15.6 g/dL (ref 13.0–17.0)
MCH: 31.8 pg (ref 26.0–34.0)
MCHC: 32.5 g/dL (ref 30.0–36.0)
MCV: 97.8 fL (ref 80.0–100.0)
Platelets: 132 10*3/uL — ABNORMAL LOW (ref 150–400)
RBC: 4.91 MIL/uL (ref 4.22–5.81)
RDW: 13.7 % (ref 11.5–15.5)
WBC: 4.4 10*3/uL (ref 4.0–10.5)
nRBC: 0 % (ref 0.0–0.2)

## 2023-11-16 LAB — PROTEIN / CREATININE RATIO, URINE
Creatinine, Urine: 98 mg/dL
Total Protein, Urine: <6 mg/dL

## 2023-11-16 LAB — RENAL FUNCTION PANEL
Albumin: 4.1 g/dL (ref 3.5–5.0)
Anion gap: 8 (ref 5–15)
BUN: 26 mg/dL — ABNORMAL HIGH (ref 8–23)
CO2: 28 mmol/L (ref 22–32)
Calcium: 9.5 mg/dL (ref 8.9–10.3)
Chloride: 103 mmol/L (ref 98–111)
Creatinine, Ser: 1.35 mg/dL — ABNORMAL HIGH (ref 0.61–1.24)
GFR, Estimated: 54 mL/min — ABNORMAL LOW (ref >60)
Glucose, Bld: 134 mg/dL — ABNORMAL HIGH (ref 70–99)
Phosphorus: 3 mg/dL (ref 2.5–4.6)
Potassium: 4.3 mmol/L (ref 3.5–5.1)
Sodium: 139 mmol/L (ref 135–145)

## 2023-11-16 LAB — LAB REPORT - SCANNED
EGFR: 54
HM Hepatitis Screen: NEGATIVE

## 2023-11-16 LAB — HEPATITIS B SURFACE ANTIGEN: Hepatitis B Surface Ag: NONREACTIVE

## 2023-11-16 LAB — VITAMIN D 25 HYDROXY (VIT D DEFICIENCY, FRACTURES): Vit D, 25-Hydroxy: 58.19 ng/mL (ref 30–100)

## 2023-11-16 NOTE — Progress Notes (Unsigned)
 EPIC Encounter for ICM Monitoring  Patient Name: [name] [name] is a 78 y.o. male Date: 11/16/2023 Primary Care Physican: Corwin Levins, MD Primary Cardiologist: Jens Som Electrophysiologist: Mealor 04/20/2022 Not weighing at home 05/07/2022 Weight: 181 lbs (does not weigh consistently at home) 09/29/2022 Office Weight: 190 lbs 11/17/2022 Weight: 185 lbs 04/05/2023 Office Weight: 192 lbs       08/03/2023 Weight: 190 lbs   11/16/2023 Weight: 199 lbs (he reports is baseline)                                        Spoke with patient and heart failure questions reviewed.  Transmission results reviewed.  Pt reports feeling weak and notified Dr Jens Som 4/7.  He denies any fluid symptoms.   Pt was on fishing vacation last week with his son.     DIET:  Pt uses no salt at home and occasionally eats out.       Optivol thoracic impedance suggesting possible fluid accumulation starting 3/30.      Prescribed: Furosemide 20 mg take 2 tablet(s) (40 mg total) by mouth daily  11/16/2023 Confirmed he takes 40 mg bid. Jardiance 10 mg take 1 tablet by mouth daily   Labs: 11/16/2023 Creatinine 1.35, BUN 26, Potassium 4.3, Sodium 139, GFR 54 08/30/2023 Creatinine 1.35, BUN 32, Potassium 4.1, Sodium 135, GFR 54  A complete set of results can be found in Results Review.   Recommendations:   Advised to limit salt intake and he reports he does not drink more than 64 oz fluid daily.  Copy sent to Dr Jens Som for review and will call back if physician makes any recommendations regarding possible fluid.    Follow-up plan: ICM clinic phone appointment on 11/22/2023 to recheck fluid levels.   91 day device clinic remote transmission 02/01/2024.     EP/Cardiology Office Visits:   Recall 09/22/2024 with Dr Jens Som.  Recall 01/31/2024 with Dr Nelly Laurence.    Copy of ICM check sent to Dr. Nelly Laurence.   3 month ICM trend: 11/15/2023.    12-14 Month ICM trend:     Karie Soda, RN 11/16/2023 3:05 PM

## 2023-11-17 ENCOUNTER — Encounter: Payer: Self-pay | Admitting: Internal Medicine

## 2023-11-17 ENCOUNTER — Ambulatory Visit (INDEPENDENT_AMBULATORY_CARE_PROVIDER_SITE_OTHER): Payer: Medicare HMO | Admitting: Internal Medicine

## 2023-11-17 VITALS — BP 118/74 | HR 59 | Temp 97.7°F | Ht 70.0 in | Wt 206.0 lb

## 2023-11-17 DIAGNOSIS — I10 Essential (primary) hypertension: Secondary | ICD-10-CM | POA: Diagnosis not present

## 2023-11-17 DIAGNOSIS — R739 Hyperglycemia, unspecified: Secondary | ICD-10-CM

## 2023-11-17 DIAGNOSIS — Z0001 Encounter for general adult medical examination with abnormal findings: Secondary | ICD-10-CM | POA: Diagnosis not present

## 2023-11-17 DIAGNOSIS — E538 Deficiency of other specified B group vitamins: Secondary | ICD-10-CM | POA: Diagnosis not present

## 2023-11-17 DIAGNOSIS — E559 Vitamin D deficiency, unspecified: Secondary | ICD-10-CM

## 2023-11-17 DIAGNOSIS — F5101 Primary insomnia: Secondary | ICD-10-CM

## 2023-11-17 DIAGNOSIS — R202 Paresthesia of skin: Secondary | ICD-10-CM

## 2023-11-17 DIAGNOSIS — E7800 Pure hypercholesterolemia, unspecified: Secondary | ICD-10-CM | POA: Diagnosis not present

## 2023-11-17 LAB — CBC WITH DIFFERENTIAL/PLATELET
Basophils Absolute: 0 10*3/uL (ref 0.0–0.1)
Basophils Relative: 0.8 % (ref 0.0–3.0)
Eosinophils Absolute: 0.2 10*3/uL (ref 0.0–0.7)
Eosinophils Relative: 3.4 % (ref 0.0–5.0)
HCT: 43.7 % (ref 39.0–52.0)
Hemoglobin: 14.9 g/dL (ref 13.0–17.0)
Lymphocytes Relative: 34.9 % (ref 12.0–46.0)
Lymphs Abs: 1.7 10*3/uL (ref 0.7–4.0)
MCHC: 34.1 g/dL (ref 30.0–36.0)
MCV: 95.7 fl (ref 78.0–100.0)
Monocytes Absolute: 0.4 10*3/uL (ref 0.1–1.0)
Monocytes Relative: 8.3 % (ref 3.0–12.0)
Neutro Abs: 2.6 10*3/uL (ref 1.4–7.7)
Neutrophils Relative %: 52.6 % (ref 43.0–77.0)
Platelets: 136 10*3/uL — ABNORMAL LOW (ref 150.0–400.0)
RBC: 4.57 Mil/uL (ref 4.22–5.81)
RDW: 13.8 % (ref 11.5–15.5)
WBC: 5 10*3/uL (ref 4.0–10.5)

## 2023-11-17 LAB — HEPATIC FUNCTION PANEL
ALT: 29 U/L (ref 0–53)
AST: 33 U/L (ref 0–37)
Albumin: 4.4 g/dL (ref 3.5–5.2)
Alkaline Phosphatase: 54 U/L (ref 39–117)
Bilirubin, Direct: 0.1 mg/dL (ref 0.0–0.3)
Total Bilirubin: 0.6 mg/dL (ref 0.2–1.2)
Total Protein: 7.1 g/dL (ref 6.0–8.3)

## 2023-11-17 LAB — LIPID PANEL
Cholesterol: 158 mg/dL (ref 0–200)
HDL: 39.7 mg/dL (ref >39.00)
LDL Cholesterol: 58 mg/dL (ref 0–99)
NonHDL: 118.64
Total CHOL/HDL Ratio: 4
Triglycerides: 303 mg/dL — ABNORMAL HIGH (ref 0.0–149.0)
VLDL: 60.6 mg/dL — ABNORMAL HIGH (ref 0.0–40.0)

## 2023-11-17 LAB — TSH: TSH: 2.52 u[IU]/mL (ref 0.35–5.50)

## 2023-11-17 LAB — BASIC METABOLIC PANEL WITH GFR
BUN: 28 mg/dL — ABNORMAL HIGH (ref 6–23)
CO2: 34 meq/L — ABNORMAL HIGH (ref 19–32)
Calcium: 9.3 mg/dL (ref 8.4–10.5)
Chloride: 100 meq/L (ref 96–112)
Creatinine, Ser: 1.52 mg/dL — ABNORMAL HIGH (ref 0.40–1.50)
GFR: 43.58 mL/min — ABNORMAL LOW (ref >60.00)
Glucose, Bld: 107 mg/dL — ABNORMAL HIGH (ref 70–99)
Potassium: 4.5 meq/L (ref 3.5–5.1)
Sodium: 140 meq/L (ref 135–145)

## 2023-11-17 LAB — HEMOGLOBIN A1C: Hgb A1c MFr Bld: 5.8 % (ref 4.6–6.5)

## 2023-11-17 LAB — VITAMIN B12: Vitamin B-12: 602 pg/mL (ref 211–911)

## 2023-11-17 LAB — HEPATITIS B SURFACE ANTIBODY, QUANTITATIVE: Hep B S AB Quant (Post): <3.5 m[IU]/mL — ABNORMAL LOW

## 2023-11-17 LAB — PARATHYROID HORMONE, INTACT (NO CA): PTH: 28 pg/mL (ref 15–65)

## 2023-11-17 LAB — HCV INTERPRETATION

## 2023-11-17 LAB — VITAMIN D 25 HYDROXY (VIT D DEFICIENCY, FRACTURES): VITD: 59.72 ng/mL (ref 30.00–100.00)

## 2023-11-17 LAB — HCV AB W REFLEX TO QUANT PCR: HCV Ab: NONREACTIVE

## 2023-11-17 LAB — HEPATITIS B CORE ANTIBODY, TOTAL: HEP B CORE AB: NEGATIVE

## 2023-11-17 MED ORDER — TRAZODONE HCL 50 MG PO TABS
25.0000 mg | ORAL_TABLET | Freq: Every evening | ORAL | 1 refills | Status: DC | PRN
Start: 2023-11-17 — End: 2024-05-08

## 2023-11-17 NOTE — Progress Notes (Signed)
 Patient ID: [name] [name], male   DOB: 01-09-1946, 78 y.o.   MRN: [id number]         Chief Complaint:: wellness exam and insomnia, right foot paresthesia, htn, hld, hyperglycemia, low vit d       HPI:  [name] [name] is a 78 y.o. male here for wellness exam; o/w up to date                        Also Pt denies chest pain, increased sob or doe, wheezing, orthopnea, PND, increased LE swelling, palpitations, dizziness or syncope.   Pt denies polydipsia, polyuria, or new focal neuro s/s.    Pt denies fever, wt loss, night sweats, loss of appetite, or other constitutional symptoms  Has worsening sleep difficulty getting to sleep.  Also has unusual persistent several wks right foot numbness paresthesia pins and needles like without other pain, swelling, injury or weakness,but constant   Wt Readings from Last 3 Encounters:  11/17/23 206 lb (93.4 kg)  11/01/23 199 lb (90.3 kg)  09/28/23 203 lb (92.1 kg)   BP Readings from Last 3 Encounters:  11/17/23 118/74  09/28/23 122/70  09/06/23 122/73   Immunization History  Administered Date(s) Administered   Fluad Quad(high Dose 65+) 05/24/2019, 07/07/2021   Fluad Trivalent(High Dose 65+) 06/15/2018, 05/18/2023   Influenza Split 05/10/2012, 05/07/2015   Influenza Whole 05/10/2008   Influenza, High Dose Seasonal PF 06/09/2010, 08/07/2011, 05/27/2016, 06/22/2017, 06/15/2018, 04/12/2020   Influenza,inj,Quad PF,6+ Mos 05/11/2014   Influenza,inj,quad, With Preservative 05/25/2017   Influenza-Unspecified 05/11/2007, 05/24/2008, 05/31/2009, 05/11/2012, 05/18/2013, 05/10/2014, 05/05/2015, 05/10/2017, 07/11/2019, 04/12/2020, 05/10/2021, 05/10/2022, 06/24/2022   Moderna Sars-Covid-2 Vaccination 10/17/2019, 11/14/2019, 08/01/2020   Pneumococcal Conjugate-13 12/07/2014, 05/14/2015   Pneumococcal Polysaccharide-23 10/31/2007, 12/02/2016, 10/09/2021   Pneumococcal-Unspecified 05/10/2006, 06/09/2010   Td 10/29/2008   Tdap 03/22/2019   Zoster  Recombinant(Shingrix) 08/10/2017, 10/08/2017, 11/10/2017, 01/11/2018   There are no preventive care reminders to display for this patient.     Past Medical History:  Diagnosis Date   AICD (automatic cardioverter/defibrillator) present    ALLERGIC RHINITIS 10/31/2007   Qualifier: Diagnosis of  By: Autry Legions MD, Alveda Aures    Allergy    Cancer (HCC) 12/03/2021   non-Hodgkin's B-cell lymphoma   CHOLECYSTECTOMY, HX OF 06/04/2007   Qualifier: Diagnosis of  By: Sharlette Dayhoff CMA, [place]     Chronic HFrEF (heart failure with reduced ejection fraction) (HCC)    COLONIC POLYPS, HX OF 10/31/2007   Qualifier: Diagnosis of  By: Autry Legions MD, Alveda Aures    CORONARY ARTERY BYPASS GRAFT, HX OF 06/04/2007   Qualifier: Diagnosis of  By: Sharlette Dayhoff CMA, [place]     CORONARY ARTERY DISEASE 06/04/2007   Qualifier: Diagnosis of  By: Sharlette Dayhoff CMA, [place]     Diverticulosis    Erectile dysfunction    Eye twitch    right eye since chilhood    History of hiatal hernia    HYPERLIPIDEMIA 06/04/2007   Qualifier: Diagnosis of  By: Sharlette Dayhoff CMA, [place]     Hypersomnolence    HYPERTENSION 06/04/2007   Qualifier: Diagnosis of  By: Sharlette Dayhoff CMA, [place]     Hypothyroidism    pt does not take medications and doesn't know if he has it   ISCHEMIC CARDIOMYOPATHY 06/04/2007   Qualifier: Diagnosis of  By: Sharlette Dayhoff CMA, [place]     Ischemic cardiomyopathy    LV (left ventricular) mural thrombus 02/15/2019   Myocardial infarction [facility])    per patient , his occurred  in 1998    Osteoarthritis    Peripheral vascular disease (HCC)    PLMD (periodic limb movement disorder)    Pneumonia    Vertigo    Vitamin D deficiency    Past Surgical History:  Procedure Laterality Date   BIOPSY  11/25/2021   Procedure: BIOPSY;  Surgeon: Lajuan Pila, MD;  Location: [facility];  Service: Gastroenterology;;   CABG  1999   was done at the [facility] in [place], [place].  Patient reports that a small vein was taken from under his arm and a  small incesion was made.   CHOLECYSTECTOMY     COLONOSCOPY     COLONOSCOPY WITH PROPOFOL N/A 11/25/2021   Procedure: COLONOSCOPY WITH PROPOFOL;  Surgeon: Lajuan Pila, MD;  Location: [facility];  Service: Gastroenterology;  Laterality: N/A;   ENDOVASCULAR REPAIR OF POPLITEAL ARTERY ANEURYSM Right 05/20/2022   Procedure: LIGATION OF POPLITEAL ARTERY ANEURYSM;  Surgeon: Young Hensen, MD;  Location: [facility] OR;  Service: Vascular;  Laterality: Right;   ESOPHAGOGASTRODUODENOSCOPY (EGD) WITH PROPOFOL N/A 11/25/2021   Procedure: ESOPHAGOGASTRODUODENOSCOPY (EGD) WITH PROPOFOL;  Surgeon: Lajuan Pila, MD;  [facility]: [facility];  Service: Gastroenterology;  Laterality: N/A;   FEMORAL-POPLITEAL BYPASS GRAFT Right 05/20/2022   Procedure: RIGHT ABOVE KNEE POPLITEAL ARTERY TO TIBIOPERONEAL TRUNK/POSTERIOR TIBIAL ARTERY BYPASS;  Surgeon: Young Hensen, MD;  Location: [facility] OR;  Service: Vascular;  Laterality: Right;   ICD IMPLANT N/A 01/30/2020   Procedure: ICD IMPLANT;  Surgeon: Jolly Needle, MD;  Location: [facility] INVASIVE CV LAB;  Service: Cardiovascular;  Laterality: N/A;   INCISION AND DRAINAGE Right 01/08/2023   Procedure: INCISION AND DRAINAGE OF RIGHT LEG  SEROMA, DRAIN PLACEMENT AND MYRIAD MORCELLS;  Surgeon: Young Hensen, MD;  Location: [facility] OR;  Service: Vascular;  Laterality: Right;   IR IMAGING GUIDED PORT INSERTION  12/04/2021   KNEE ARTHROPLASTY Left 08/05/2017   Procedure: LEFT TOTAL KNEE ARTHROPLASTY WITH COMPUTER NAVIGATION;  Surgeon: Adonica Hoose, MD;  Location: [facility];  Service: Orthopedics;  Laterality: Left;  Needs RNFA   LIGATION OF CILIAC ARTERY Right 05/20/2022   Procedure: LIGATION OF RIGHT ANTERIOR TIBIAL ARTERY;  Surgeon: Young Hensen, MD;  Location: [facility] OR;  Service: Vascular;  Laterality: Right;   LOWER EXTREMITY ANGIOGRAPHY Right 05/14/2022   Procedure: Lower Extremity Angiography;  Surgeon: Young Hensen, MD;  Location: [facility] INVASIVE CV LAB;   Service: Cardiovascular;  Laterality: Right;   PENILE PROSTHESIS IMPLANT     POLYPECTOMY  11/25/2021   Procedure: POLYPECTOMY;  Surgeon: Lajuan Pila, MD;  Location: [facility];  Service: Gastroenterology;;   REPLACEMENT TOTAL KNEE  06/12/2013   spinal cyst     THORACOTOMY     left anterior; wound exploration and debridement   TONSILLECTOMY AND ADENOIDECTOMY     age 68   VEIN HARVEST Right 05/20/2022   Procedure: RIGHT GREATER SAPHENOUS VEIN HARVEST;  Surgeon: Young Hensen, MD;  Location: [facility];  Service: Vascular;  Laterality: Right;    reports that he quit smoking about 42 years ago. His smoking use included cigarettes. He started smoking about 59 years ago. He has a 68 pack-year smoking history. He has been exposed to tobacco smoke. He has never used smokeless tobacco. He reports current alcohol use of about 2.0 standard drinks of alcohol per week. He reports that he does not use drugs. family history includes COPD in his son; Colon cancer in his maternal grandfather; Heart disease in his brother; Lung cancer in his  father; Pancreatic cancer in his brother, cousin, and maternal uncle; Stomach cancer in his mother. Allergies  Allergen Reactions   Ace Inhibitors Cough   Statins Other (See Comments)    Muscle pains   Current Outpatient Medications on File Prior to Visit  Medication Sig Dispense Refill   Ascorbic Acid (VITA-C PO) Take 1,000 mg by mouth daily.     aspirin EC 81 MG tablet Take 1 tablet (81 mg total) by mouth daily at 6 (six) AM. Swallow whole. 30 tablet 12   b complex vitamins capsule Take 1 capsule by mouth daily.     cholecalciferol (VITAMIN D3) 25 MCG (1000 UNIT) tablet Take 1,000 Units by mouth daily.     ezetimibe (ZETIA) 10 MG tablet TAKE 1 TABLET EVERY DAY 90 tablet 2   furosemide (LASIX) 20 MG tablet TAKE 2 TABLETS EVERY DAY 180 tablet 3   guaiFENesin (MUCINEX) 600 MG 12 hr tablet Take 2 tablets (1,200 mg total) by mouth 2 (two) times daily as needed. 60  tablet 2   losartan (COZAAR) 25 MG tablet TAKE 1 TABLET EVERY DAY 90 tablet 1   meclizine (ANTIVERT) 12.5 MG tablet Take 1 tablet (12.5 mg total) by mouth 3 (three) times daily as needed for dizziness. 40 tablet 1   meloxicam (MOBIC) 15 MG tablet Take 15 mg by mouth daily.     metoprolol succinate (TOPROL XL) 25 MG 24 hr tablet Take 1 tablet (25 mg total) by mouth daily. 90 tablet 3   Multiple Vitamins-Minerals (MULTIVITAMIN WITH MINERALS) tablet Take 1 tablet by mouth daily. Centrum silver     potassium chloride SA (KLOR-CON M) 20 MEQ tablet TAKE 1 TABLET EVERY DAY 90 tablet 3   rosuvastatin (CRESTOR) 5 MG tablet TAKE 1 TABLET EVERY OTHER DAY 45 tablet 2   warfarin (COUMADIN) 5 MG tablet TAKE 1 TABLET EVERY DAY EXCEPT TAKE 1 AND 1/2 TABLETS ON MONDAYS OR AS DIRECTED 90 tablet 1   empagliflozin (JARDIANCE) 10 MG TABS tablet Take 1 tablet (10 mg total) by mouth daily. (Patient not taking: Reported on 11/17/2023) 90 tablet 3   No current facility-administered medications on file prior to visit.        ROS:  All others reviewed and negative.  Objective        PE:  BP 118/74 (BP Location: Right Arm, Patient Position: Sitting, Cuff Size: Normal)   Pulse (!) 59   Temp 97.7 F (36.5 C) (Oral)   Ht 5\' 10"  (1.778 m)   Wt 206 lb (93.4 kg)   SpO2 98%   BMI 29.56 kg/m                 Constitutional: Pt appears in NAD               HENT: Head: NCAT.                Right Ear: External ear normal.                 Left Ear: External ear normal.                Eyes: . Pupils are equal, round, and reactive to light. Conjunctivae and EOM are normal               Nose: without d/c or deformity               Neck: Neck supple. Gross normal ROM  Cardiovascular: Normal rate and regular rhythm.                 Pulmonary/Chest: Effort normal and breath sounds without rales or wheezing.                Abd:  Soft, NT, ND, + BS, no organomegaly               Neurological: Pt is alert. At baseline  orientation, motor grossly intact               Skin: Skin is warm. No rashes, no other new lesions, LE edema - none               Psychiatric: Pt behavior is normal without agitation   Micro: none  Cardiac tracings I have personally interpreted today:  none  Pertinent Radiological findings (summarize): none   Lab Results  Component Value Date   WBC 5.0 11/17/2023   HGB 14.9 11/17/2023   HCT 43.7 11/17/2023   PLT 136.0 (L) 11/17/2023   GLUCOSE 107 (H) 11/17/2023   CHOL 158 11/17/2023   TRIG 303.0 (H) 11/17/2023   HDL 39.70 11/17/2023   LDLDIRECT 102.0 10/09/2021   LDLCALC 58 11/17/2023   ALT 29 11/17/2023   AST 33 11/17/2023   NA 140 11/17/2023   K 4.5 11/17/2023   CL 100 11/17/2023   CREATININE 1.52 (H) 11/17/2023   BUN 28 (H) 11/17/2023   CO2 34 (H) 11/17/2023   TSH 2.52 11/17/2023   PSA 1.23 10/09/2021   INR 2.1 10/27/2023   HGBA1C 5.8 11/17/2023   Assessment/Plan:  [name] [name] is a 78 y.o. White or Caucasian [1] male with  has a past medical history of AICD (automatic cardioverter/defibrillator) present, ALLERGIC RHINITIS (10/31/2007), Allergy, Cancer (HCC) (12/03/2021), CHOLECYSTECTOMY, HX OF (06/04/2007), Chronic HFrEF (heart failure with reduced ejection fraction) (HCC), COLONIC POLYPS, HX OF (10/31/2007), CORONARY ARTERY BYPASS GRAFT, HX OF (06/04/2007), CORONARY ARTERY DISEASE (06/04/2007), Diverticulosis, Erectile dysfunction, Eye twitch, History of hiatal hernia, HYPERLIPIDEMIA (06/04/2007), Hypersomnolence, HYPERTENSION (06/04/2007), Hypothyroidism, ISCHEMIC CARDIOMYOPATHY (06/04/2007), Ischemic cardiomyopathy, LV (left ventricular) mural thrombus (02/15/2019), Myocardial infarction (HCC), Osteoarthritis, Peripheral vascular disease (HCC), PLMD (periodic limb movement disorder), Pneumonia, Vertigo, and Vitamin D deficiency.  Encounter for well adult exam with abnormal findings Age and sex appropriate education and counseling updated with regular exercise and  diet Referrals for preventative services - none needed Immunizations addressed - none needed Smoking counseling  - none needed Evidence for depression or other mood disorder - none significant Most recent labs reviewed. I have personally reviewed and have noted: 1) the patient's medical and social history 2) The patient's current medications and supplements 3) The patient's height, weight, and BMI have been recorded in the chart   Hyperlipidemia Lab Results  Component Value Date   LDLCALC 58 11/17/2023   Stable, pt to continue current statin crestor 5 mg, zetia 10 mg qd   Essential hypertension BP Readings from Last 3 Encounters:  11/17/23 118/74  09/28/23 122/70  09/06/23 122/73   Stable, pt to continue medical treatment losartan 25 mg every day, toprol xl 25 qd   Vitamin D deficiency Last vitamin D Lab Results  Component Value Date   VD25OH 59.72 11/17/2023   Stable, cont oral replacement   Hyperglycemia Lab Results  Component Value Date   HGBA1C 5.8 11/17/2023   Stable, pt to continue current medical treatment jardiance 10 qd   Paresthesia of right foot Persistent, etiology unclear, for refer neurology  Insomnia Mild to mod, for trazodone 50 at bedtime prn,  to f/u any worsening symptoms or concerns  Followup: No follow-ups on file.  Rosalia Colonel, MD 11/20/2023 5:21 PM  [facility]

## 2023-11-17 NOTE — Patient Instructions (Signed)
 Please take all new medication as prescribed - the trazodone for sleep as needed  You may want to see if Nexlizet is covered on the formulary at the [facility]  Please continue all other medications as before, and refills have been done if requested.  Please have the pharmacy call with any other refills you may need.  Please continue your efforts at being more active, low cholesterol diet, and weight control.  You are otherwise up to date with prevention measures today.  Please keep your appointments with your specialists as you may have planned  You will be contacted regarding the referral for: Neurology  Please go to the LAB at the blood drawing area for the tests to be done  You will be contacted by phone if any changes need to be made immediately.  Otherwise, you will receive a letter about your results with an explanation, but please check with MyChart first.  Please make an Appointment to return in 6 months, or sooner if needed

## 2023-11-18 ENCOUNTER — Encounter: Payer: Self-pay | Admitting: Cardiovascular Disease

## 2023-11-18 ENCOUNTER — Encounter: Payer: Self-pay | Admitting: Internal Medicine

## 2023-11-18 LAB — URINALYSIS, ROUTINE W REFLEX MICROSCOPIC
Bilirubin Urine: NEGATIVE
Hgb urine dipstick: NEGATIVE
Ketones, ur: NEGATIVE
Leukocytes,Ua: NEGATIVE
Nitrite: NEGATIVE
RBC / HPF: NONE SEEN (ref 0–?)
Specific Gravity, Urine: 1.02 (ref 1.000–1.030)
Total Protein, Urine: NEGATIVE
Urine Glucose: 500 — AB
Urobilinogen, UA: 0.2 (ref 0.0–1.0)
pH: 5.5 (ref 5.0–8.0)

## 2023-11-18 NOTE — Progress Notes (Signed)
 The test results show that your current treatment is OK, as the tests are stable.  Please continue the same plan.  There is no other need for change of treatment or further evaluation based on these results, at this time.  thanks

## 2023-11-20 DIAGNOSIS — R202 Paresthesia of skin: Secondary | ICD-10-CM | POA: Insufficient documentation

## 2023-11-20 DIAGNOSIS — G4700 Insomnia, unspecified: Secondary | ICD-10-CM | POA: Insufficient documentation

## 2023-11-20 NOTE — Assessment & Plan Note (Signed)
 Lab Results  Component Value Date   HGBA1C 5.8 11/17/2023   Stable, pt to continue current medical treatment jardiance 10 qd

## 2023-11-20 NOTE — Assessment & Plan Note (Signed)
 Lab Results  Component Value Date   LDLCALC 58 11/17/2023   Stable, pt to continue current statin crestor 5 mg, zetia 10 mg qd

## 2023-11-20 NOTE — Assessment & Plan Note (Signed)
 Persistent, etiology unclear, for refer neurology

## 2023-11-20 NOTE — Assessment & Plan Note (Signed)
Mild to mod, for trazodone 50 at bedtime prn,  to f/u any worsening symptoms or concerns

## 2023-11-20 NOTE — Assessment & Plan Note (Signed)
 Last vitamin D Lab Results  Component Value Date   VD25OH 59.72 11/17/2023   Stable, cont oral replacement

## 2023-11-20 NOTE — Assessment & Plan Note (Signed)

## 2023-11-20 NOTE — Assessment & Plan Note (Signed)
 BP Readings from Last 3 Encounters:  11/17/23 118/74  09/28/23 122/70  09/06/23 122/73   Stable, pt to continue medical treatment losartan 25 mg every day, toprol xl 25 qd

## 2023-11-22 ENCOUNTER — Ambulatory Visit: Attending: Cardiovascular Disease

## 2023-11-22 DIAGNOSIS — C8510 Unspecified B-cell lymphoma, unspecified site: Secondary | ICD-10-CM | POA: Diagnosis not present

## 2023-11-22 DIAGNOSIS — I5022 Chronic systolic (congestive) heart failure: Secondary | ICD-10-CM | POA: Diagnosis not present

## 2023-11-22 DIAGNOSIS — I129 Hypertensive chronic kidney disease with stage 1 through stage 4 chronic kidney disease, or unspecified chronic kidney disease: Secondary | ICD-10-CM | POA: Diagnosis not present

## 2023-11-22 DIAGNOSIS — I701 Atherosclerosis of renal artery: Secondary | ICD-10-CM | POA: Diagnosis not present

## 2023-11-22 DIAGNOSIS — Z95810 Presence of automatic (implantable) cardiac defibrillator: Secondary | ICD-10-CM

## 2023-11-22 DIAGNOSIS — N1832 Chronic kidney disease, stage 3b: Secondary | ICD-10-CM | POA: Diagnosis not present

## 2023-11-23 ENCOUNTER — Telehealth: Payer: Self-pay | Admitting: Internal Medicine

## 2023-11-23 NOTE — Telephone Encounter (Signed)
 Copied from CRM [id number]. Topic: General - Other >> Nov 23, 2023 11:27 AM Danika B wrote: Reason for CRM: Patient called in stating that Dr. Autry Legions referred him to [facility] at the [facility], but that the [place] requires a request form faxed over to them before he can be seen. Patients Callback [phone]  FAX# [facility] [place]: #[phone]

## 2023-11-23 NOTE — Telephone Encounter (Signed)
 Sorry I dont know how to address this, and I do not know what [facility] is or why he needs to go there

## 2023-11-24 ENCOUNTER — Ambulatory Visit: Attending: Cardiology | Admitting: *Deleted

## 2023-11-24 DIAGNOSIS — I513 Intracardiac thrombosis, not elsewhere classified: Secondary | ICD-10-CM

## 2023-11-24 DIAGNOSIS — Z5181 Encounter for therapeutic drug level monitoring: Secondary | ICD-10-CM | POA: Diagnosis not present

## 2023-11-24 LAB — POCT INR: INR: 3.8 — AB (ref 2.0–3.0)

## 2023-11-24 NOTE — Patient Instructions (Signed)
 Hold warfarin tonight then resume 1 tablet daily except 1/2 tablet on Tuesdays Continue greens Recheck in 3 wks

## 2023-11-24 NOTE — Progress Notes (Signed)
 EPIC Encounter for ICM Monitoring  Patient Name: [name] [name] is a 78 y.o. male Date: 11/24/2023 Primary Care Physican: Roslyn Coombe, MD Primary Cardiologist: Audery Blazing Electrophysiologist: Mealor 04/20/2022 Not weighing at home 05/07/2022 Weight: 181 lbs (does not weigh consistently at home) 09/29/2022 Office Weight: 190 lbs 11/17/2022 Weight: 185 lbs 04/05/2023 Office Weight: 192 lbs       08/03/2023 Weight: 190 lbs   11/16/2023 Weight: 199 lbs (he reports is baseline)                                        Spoke with patient and heart failure questions reviewed.  Transmission results reviewed.  Pt asymptomatic for fluid accumulation.       DIET:  Pt uses no salt at home and occasionally eats out.       Optivol thoracic impedance suggesting fluid levels returned to normal 4/12.      Prescribed: Furosemide 20 mg take 2 tablet(s) (40 mg total) by mouth daily  11/16/2023 Confirmed he takes 40 mg bid. Jardiance 10 mg take 1 tablet by mouth daily   Labs: 11/16/2023 Creatinine 1.35, BUN 26, Potassium 4.3, Sodium 139, GFR 54 08/30/2023 Creatinine 1.35, BUN 32, Potassium 4.1, Sodium 135, GFR 54  A complete set of results can be found in Results Review.   Recommendations:   No changes and encouraged to call if experiencing any fluid symptoms.   Follow-up plan: ICM clinic phone appointment on 12/20/2023.   91 day device clinic remote transmission 02/01/2024.     EP/Cardiology Office Visits:   Recall 09/22/2024 with Dr Audery Blazing.  Recall 01/31/2024 with Dr Arlester Ladd.    Copy of ICM check sent to Dr. Arlester Ladd.   3 month ICM trend: 11/22/2023.    12-14 Month ICM trend:     Almyra Jain, RN 11/24/2023 8:07 AM

## 2023-11-25 ENCOUNTER — Encounter: Payer: Self-pay | Admitting: Hematology

## 2023-11-26 ENCOUNTER — Encounter: Payer: Self-pay | Admitting: *Deleted

## 2023-11-26 ENCOUNTER — Ambulatory Visit (HOSPITAL_COMMUNITY)
Admission: RE | Admit: 2023-11-26 | Discharge: 2023-11-26 | Disposition: A | Discharge status: Home / Self Care | Source: Ambulatory Visit | Attending: Internal Medicine | Admitting: Internal Medicine

## 2023-11-26 DIAGNOSIS — I5022 Chronic systolic (congestive) heart failure: Secondary | ICD-10-CM | POA: Diagnosis not present

## 2023-11-26 LAB — ECHOCARDIOGRAM COMPLETE
AR max vel: 1.86 cm2
AV Area VTI: 1.69 cm2
AV Area mean vel: 1.63 cm2
AV Mean grad: 7 mmHg
AV Peak grad: 12.8 mmHg
Ao pk vel: 1.79 m/s
Area-P 1/2: 2.72 cm2
MV VTI: 1.91 cm2
S' Lateral: 3.6 cm

## 2023-11-26 MED ORDER — PERFLUTREN LIPID MICROSPHERE
1.0000 mL | INTRAVENOUS | Status: AC | PRN
  Administered 2023-11-26: 6 mL via INTRAVENOUS

## 2023-11-26 NOTE — Progress Notes (Signed)
*  PRELIMINARY RESULTS* Echocardiogram 2D Echocardiogram has been performed.  Silvana Drones 11/26/2023, 12:07 PM

## 2023-11-30 ENCOUNTER — Inpatient Hospital Stay: Payer: Medicare HMO | Attending: Hematology

## 2023-11-30 VITALS — BP 111/58 | HR 67 | Temp 97.7°F | Resp 18

## 2023-11-30 DIAGNOSIS — Z8572 Personal history of non-Hodgkin lymphomas: Secondary | ICD-10-CM | POA: Diagnosis not present

## 2023-11-30 DIAGNOSIS — C8510 Unspecified B-cell lymphoma, unspecified site: Secondary | ICD-10-CM

## 2023-11-30 MED ORDER — HEPARIN SOD (PORK) LOCK FLUSH 100 UNIT/ML IV SOLN
500.0000 [IU] | Freq: Once | INTRAVENOUS | Status: AC
  Administered 2023-11-30: 500 [IU] via INTRAVENOUS

## 2023-11-30 MED ORDER — SODIUM CHLORIDE 0.9% FLUSH
10.0000 mL | Freq: Once | INTRAVENOUS | Status: AC
  Administered 2023-11-30: 10 mL via INTRAVENOUS

## 2023-11-30 NOTE — Patient Instructions (Signed)

## 2023-11-30 NOTE — Progress Notes (Signed)
 Patients port flushed without difficulty.  Good blood return noted with no bruising or swelling noted at site.  Band aid applied.  VSS with discharge and left in satisfactory condition with no s/s of distress noted.

## 2023-12-01 NOTE — Telephone Encounter (Unsigned)
 Copied from CRM [id number]. Topic: General - Other >> Dec 01, 2023 12:21 PM Orien Bird wrote: Reason for CRM: Patient called stating he gave the clinic a fax number to [facility] and he stated you all have not faxed over what he needs faxed and he has been calling for 2 weeks now about this

## 2023-12-02 DIAGNOSIS — H5203 Hypermetropia, bilateral: Secondary | ICD-10-CM | POA: Diagnosis not present

## 2023-12-02 DIAGNOSIS — H52223 Regular astigmatism, bilateral: Secondary | ICD-10-CM | POA: Diagnosis not present

## 2023-12-02 DIAGNOSIS — H524 Presbyopia: Secondary | ICD-10-CM | POA: Diagnosis not present

## 2023-12-03 ENCOUNTER — Other Ambulatory Visit (HOSPITAL_COMMUNITY)

## 2023-12-07 ENCOUNTER — Ambulatory Visit: Payer: Medicare HMO | Admitting: Hematology

## 2023-12-10 ENCOUNTER — Other Ambulatory Visit: Payer: Self-pay

## 2023-12-10 MED ORDER — METOPROLOL SUCCINATE ER 25 MG PO TB24
25.0000 mg | ORAL_TABLET | Freq: Every day | ORAL | 0 refills | Status: DC
Start: 2023-12-10 — End: 2024-01-14

## 2023-12-15 ENCOUNTER — Ambulatory Visit: Attending: Cardiology | Admitting: *Deleted

## 2023-12-15 DIAGNOSIS — Z5181 Encounter for therapeutic drug level monitoring: Secondary | ICD-10-CM | POA: Diagnosis not present

## 2023-12-15 DIAGNOSIS — I513 Intracardiac thrombosis, not elsewhere classified: Secondary | ICD-10-CM

## 2023-12-15 LAB — POCT INR: INR: 3.4 — AB (ref 2.0–3.0)

## 2023-12-15 NOTE — Patient Instructions (Signed)
 Hold warfarin tonight then decrease dose to 1 tablet daily except 1/2 tablet on Tuesdays and Fridays Continue greens Recheck in 3 wks

## 2023-12-16 NOTE — Progress Notes (Signed)
 Remote ICD transmission.

## 2023-12-20 ENCOUNTER — Ambulatory Visit: Attending: Cardiovascular Disease

## 2023-12-20 DIAGNOSIS — Z95810 Presence of automatic (implantable) cardiac defibrillator: Secondary | ICD-10-CM | POA: Diagnosis not present

## 2023-12-20 DIAGNOSIS — I5022 Chronic systolic (congestive) heart failure: Secondary | ICD-10-CM | POA: Diagnosis not present

## 2023-12-24 ENCOUNTER — Ambulatory Visit: Payer: Self-pay

## 2023-12-24 NOTE — Progress Notes (Signed)
 EPIC Encounter for ICM Monitoring  Patient Name: [name] [name] is a 78 y.o. male Date: 12/24/2023 Primary Care Physican: Roslyn Coombe, MD Primary Cardiologist: Audery Blazing Electrophysiologist: Mealor 04/20/2022 Not weighing at home 05/07/2022 Weight: 181 lbs (does not weigh consistently at home) 09/29/2022 Office Weight: 190 lbs 11/17/2022 Weight: 185 lbs 04/05/2023 Office Weight: 192 lbs       08/03/2023 Weight: 190 lbs   11/16/2023 Weight: 199 lbs (he reports is baseline)                                        Spoke with patient and heart failure questions reviewed.  Transmission results reviewed.  Pt asymptomatic for fluid accumulation.   He is having weakness in legs which resulted in a fall today but no injuries.  He was referred to Neurologist by PCP regarding numbness in 1 foot but has not received a call for an appt.  Since he saw PCP on 4/9 he has developed the general leg weakness that effects his activities of daily living.  Advised to call PCP and update him that he has developed leg weakness since last OV and has not heart anything from Neurologist.      DIET:  Pt uses no salt at home and occasionally eats out.       Optivol thoracic impedance suggesting normal fluid levels with the exception of possible fluid accumulation from 4/18-5/7.      Prescribed: Furosemide  20 mg take 2 tablet(s) (40 mg total) by mouth daily   Jardiance  10 mg take 1 tablet by mouth daily   Labs: 11/16/2023 Creatinine 1.35, BUN 26, Potassium 4.3, Sodium 139, GFR 54 08/30/2023 Creatinine 1.35, BUN 32, Potassium 4.1, Sodium 135, GFR 54  A complete set of results can be found in Results Review.   Recommendations:   No changes and encouraged to call if experiencing any fluid symptoms.   Follow-up plan: ICM clinic phone appointment on 01/24/2024.   91 day device clinic remote transmission 02/01/2024.     EP/Cardiology Office Visits:   Recall 09/22/2024 with Dr Audery Blazing.  Provided EP scheduler number.   Recall  01/31/2024 with Dr Arlester Ladd.    Copy of ICM check sent to Dr. Arlester Ladd.   3 month ICM trend: 12/20/2023.    12-14 Month ICM trend:     Almyra Jain, RN 12/24/2023 2:48 PM

## 2023-12-24 NOTE — Telephone Encounter (Signed)
 Reason for Triage: Weakness in both legs and fell on porch but theres no injuries, Right foot has numbness in toes    Chief Complaint: [name] [name] today, slipped on his front porch. No injury.Appointment for next Friday. Asking to be worked in by PCP next week. Symptoms: legs feel weak Frequency: today Pertinent Negatives: Patient denies  Disposition: [] ED /[] Urgent Care (no appt availability in office) / [x] Appointment(In office/virtual)/ []  [facility]/ [] Home Care/ [] Refused Recommended Disposition /[] [facility]/ []  Follow-up with PCP Additional Notes: Please advise pt.  Reason for Disposition  MILD weakness (i.e., does not interfere with ability to work, go to school, normal activities)  (Exception: Mild weakness is a chronic symptom.)  Answer Assessment - Initial Assessment Questions 1. MECHANISM: "How did the fall happen?"     Slipped on porch 2. DOMESTIC VIOLENCE AND ELDER ABUSE SCREENING: "Did you fall because someone pushed you or tried to hurt you?" If Yes, ask: "Are you safe now?"     no 3. ONSET: "When did the fall happen?" (e.g., minutes, hours, or days ago)     today 4. LOCATION: "What part of the body hit the ground?" (e.g., back, buttocks, head, hips, knees, hands, head, stomach)     Hand 5. INJURY: "Did you hurt (injure) yourself when you fell?" If Yes, ask: "What did you injure? Tell me more about this?" (e.g., body area; type of injury; pain severity)"     No 6. PAIN: "Is there any pain?" If Yes, ask: "How bad is the pain?" (e.g., Scale 1-10; or mild,  moderate, severe)   - NONE (0): No pain   - MILD (1-3): Doesn't interfere with normal activities    - MODERATE (4-7): Interferes with normal activities or awakens from sleep    - SEVERE (8-10): Excruciating pain, unable to do any normal activities      no 7. SIZE: For cuts, bruises, or swelling, ask: "How large is it?" (e.g., inches or centimeters)      N/a 8. PREGNANCY: "Is there any chance you are  pregnant?" "When was your last menstrual period?"     N/a 9. OTHER SYMPTOMS: "Do you have any other symptoms?" (e.g., dizziness, fever, weakness; new onset or worsening).      Legs are weak 10. CAUSE: "What do you think caused the fall (or falling)?" (e.g., tripped, dizzy spell)       tripped  Protocols used: Falls and [facility]

## 2023-12-27 ENCOUNTER — Other Ambulatory Visit: Payer: Self-pay | Admitting: Cardiovascular Disease

## 2023-12-27 DIAGNOSIS — Z7901 Long term (current) use of anticoagulants: Secondary | ICD-10-CM

## 2023-12-31 ENCOUNTER — Encounter: Payer: Self-pay | Admitting: Internal Medicine

## 2023-12-31 ENCOUNTER — Ambulatory Visit (INDEPENDENT_AMBULATORY_CARE_PROVIDER_SITE_OTHER)

## 2023-12-31 ENCOUNTER — Ambulatory Visit (INDEPENDENT_AMBULATORY_CARE_PROVIDER_SITE_OTHER): Admitting: Internal Medicine

## 2023-12-31 VITALS — BP 132/78 | HR 63 | Temp 97.8°F | Ht 70.0 in | Wt 202.0 lb

## 2023-12-31 DIAGNOSIS — E7800 Pure hypercholesterolemia, unspecified: Secondary | ICD-10-CM

## 2023-12-31 DIAGNOSIS — N529 Male erectile dysfunction, unspecified: Secondary | ICD-10-CM | POA: Diagnosis not present

## 2023-12-31 DIAGNOSIS — R051 Acute cough: Secondary | ICD-10-CM

## 2023-12-31 DIAGNOSIS — I10 Essential (primary) hypertension: Secondary | ICD-10-CM

## 2023-12-31 DIAGNOSIS — E559 Vitamin D deficiency, unspecified: Secondary | ICD-10-CM | POA: Diagnosis not present

## 2023-12-31 DIAGNOSIS — R059 Cough, unspecified: Secondary | ICD-10-CM | POA: Diagnosis not present

## 2023-12-31 DIAGNOSIS — R531 Weakness: Secondary | ICD-10-CM | POA: Diagnosis not present

## 2023-12-31 LAB — CBC WITH DIFFERENTIAL/PLATELET
Basophils Absolute: 0 10*3/uL (ref 0.0–0.1)
Basophils Relative: 0.9 % (ref 0.0–3.0)
Eosinophils Absolute: 0.2 10*3/uL (ref 0.0–0.7)
Eosinophils Relative: 3.6 % (ref 0.0–5.0)
HCT: 43.8 % (ref 39.0–52.0)
Hemoglobin: 14.7 g/dL (ref 13.0–17.0)
Lymphocytes Relative: 36.2 % (ref 12.0–46.0)
Lymphs Abs: 1.8 10*3/uL (ref 0.7–4.0)
MCHC: 33.5 g/dL (ref 30.0–36.0)
MCV: 94.1 fl (ref 78.0–100.0)
Monocytes Absolute: 0.4 10*3/uL (ref 0.1–1.0)
Monocytes Relative: 8.5 % (ref 3.0–12.0)
Neutro Abs: 2.5 10*3/uL (ref 1.4–7.7)
Neutrophils Relative %: 50.8 % (ref 43.0–77.0)
Platelets: 114 10*3/uL — ABNORMAL LOW (ref 150.0–400.0)
RBC: 4.65 Mil/uL (ref 4.22–5.81)
RDW: 13.6 % (ref 11.5–15.5)
WBC: 5 10*3/uL (ref 4.0–10.5)

## 2023-12-31 LAB — BASIC METABOLIC PANEL WITH GFR
BUN: 27 mg/dL — ABNORMAL HIGH (ref 6–23)
CO2: 34 meq/L — ABNORMAL HIGH (ref 19–32)
Calcium: 9.5 mg/dL (ref 8.4–10.5)
Chloride: 100 meq/L (ref 96–112)
Creatinine, Ser: 1.66 mg/dL — ABNORMAL HIGH (ref 0.40–1.50)
GFR: 39.17 mL/min — ABNORMAL LOW (ref >60.00)
Glucose, Bld: 89 mg/dL (ref 70–99)
Potassium: 4.3 meq/L (ref 3.5–5.1)
Sodium: 141 meq/L (ref 135–145)

## 2023-12-31 LAB — HEPATIC FUNCTION PANEL
ALT: 20 U/L (ref 0–53)
AST: 23 U/L (ref 0–37)
Albumin: 4.5 g/dL (ref 3.5–5.2)
Alkaline Phosphatase: 54 U/L (ref 39–117)
Bilirubin, Direct: 0.2 mg/dL (ref 0.0–0.3)
Total Bilirubin: 0.9 mg/dL (ref 0.2–1.2)
Total Protein: 7.3 g/dL (ref 6.0–8.3)

## 2023-12-31 MED ORDER — LEVOFLOXACIN 500 MG PO TABS: 500.0000 mg | ORAL_TABLET | Freq: Every day | ORAL | 0 refills | Status: DC

## 2023-12-31 MED ORDER — SILDENAFIL CITRATE 100 MG PO TABS: 50.0000 mg | ORAL_TABLET | Freq: Every day | ORAL | 11 refills | Status: DC | PRN

## 2023-12-31 MED ORDER — HYDROCODONE BIT-HOMATROP MBR 5-1.5 MG/5ML PO SOLN: 5.0000 mL | Freq: Four times a day (QID) | ORAL | 0 refills | Status: AC | PRN

## 2023-12-31 MED ORDER — ALBUTEROL SULFATE HFA 108 (90 BASE) MCG/ACT IN AERS: 2.0000 | INHALATION_SPRAY | Freq: Four times a day (QID) | RESPIRATORY_TRACT | 0 refills | Status: DC | PRN

## 2023-12-31 MED ORDER — PREDNISONE 10 MG PO TABS: ORAL_TABLET | ORAL | 0 refills | Status: DC

## 2023-12-31 NOTE — Progress Notes (Unsigned)
 Patient ID: [name] [name], male   DOB: [birth year], 79 y.o.   MRN: [id number]        Chief Complaint: follow up copd exacerbation cough, ED, hld, htn, low vit d        HPI:  [name] [name] is a 78 y.o. male here overall mildly ill x 1 wk and has had some weakness, balance off and even missed step and fell to left hand with bruising now improved.  Has 1 wk cough mild productive with mild sob wheezing , and hx of PNA x 2 days.  Pt denies chest pain, orthopnea, PND, increased LE swelling, palpitations, dizziness or syncope.   Pt denies polydipsia, polyuria, or new focal neuro s/s.   Also with worsening Ed symptoms last 6 months.            Wt Readings from Last 3 Encounters:  12/31/23 202 lb (91.6 kg)  11/17/23 206 lb (93.4 kg)  11/01/23 199 lb (90.3 kg)   BP Readings from Last 3 Encounters:  12/31/23 132/78  11/30/23 (!) 111/58  11/17/23 118/74         Past Medical History:  Diagnosis Date   AICD (automatic cardioverter/defibrillator) present    ALLERGIC RHINITIS 10/31/2007   Qualifier: Diagnosis of  By: Autry Legions MD, Alveda Aures    Allergy    Cancer (HCC) 12/03/2021   non-Hodgkin's B-cell lymphoma   CHOLECYSTECTOMY, HX OF 06/04/2007   Qualifier: Diagnosis of  By: Sharlette Dayhoff CMA, [place]     Chronic HFrEF (heart failure with reduced ejection fraction) (HCC)    COLONIC POLYPS, HX OF 10/31/2007   Qualifier: Diagnosis of  By: Autry Legions MD, Alveda Aures    CORONARY ARTERY BYPASS GRAFT, HX OF 06/04/2007   Qualifier: Diagnosis of  By: Sharlette Dayhoff CMA, [place]     CORONARY ARTERY DISEASE 06/04/2007   Qualifier: Diagnosis of  By: Sharlette Dayhoff CMA, [place]     Diverticulosis    Erectile dysfunction    Eye twitch    right eye since chilhood    History of hiatal hernia    HYPERLIPIDEMIA 06/04/2007   Qualifier: Diagnosis of  By: Sharlette Dayhoff CMA, [place]     Hypersomnolence    HYPERTENSION 06/04/2007   Qualifier: Diagnosis of  By: Sharlette Dayhoff CMA, [place]     Hypothyroidism    pt does not take medications and doesn't know  if he has it   ISCHEMIC CARDIOMYOPATHY 06/04/2007   Qualifier: Diagnosis of  By: Sharlette Dayhoff CMA, [place]     Ischemic cardiomyopathy    LV (left ventricular) mural thrombus 02/15/2019   Myocardial infarction [facility])    per patient , his occurred in 1998    Osteoarthritis    Peripheral vascular disease (HCC)    PLMD (periodic limb movement disorder)    Pneumonia    Vertigo    Vitamin D  deficiency    Past Surgical History:  Procedure Laterality Date   BIOPSY  11/25/2021   Procedure: BIOPSY;  Surgeon: Lajuan Pila, MD;  Location: [facility];  Service: Gastroenterology;;   CABG  1999   [facility] [place], [place].  Patient reports that a small vein was taken from under his arm and a small incesion was made.   CHOLECYSTECTOMY     COLONOSCOPY     COLONOSCOPY WITH PROPOFOL  N/A 11/25/2021   Procedure: COLONOSCOPY WITH PROPOFOL ;  Surgeon: Lajuan Pila, MD;  Location: [facility];  Service: Gastroenterology;  Laterality: N/A;   ENDOVASCULAR REPAIR OF POPLITEAL ARTERY  ANEURYSM Right 05/20/2022   Procedure: LIGATION OF POPLITEAL ARTERY ANEURYSM;  Surgeon: Young Hensen, MD;  Location: [facility] OR;  Service: Vascular;  Laterality: Right;   ESOPHAGOGASTRODUODENOSCOPY (EGD) WITH PROPOFOL  N/A 11/25/2021   Procedure: ESOPHAGOGASTRODUODENOSCOPY (EGD) WITH PROPOFOL ;  Surgeon: Lajuan Pila, MD;  Location: [facility];  Service: Gastroenterology;  Laterality: N/A;   FEMORAL-POPLITEAL BYPASS GRAFT Right 05/20/2022   Procedure: RIGHT ABOVE KNEE POPLITEAL ARTERY TO TIBIOPERONEAL TRUNK/POSTERIOR TIBIAL ARTERY BYPASS;  Surgeon: Young Hensen, MD;  Location: [facility] OR;  Service: Vascular;  Laterality: Right;   ICD IMPLANT N/A 01/30/2020   Procedure: ICD IMPLANT;  Surgeon: Jolly Needle, MD;  Location: [facility] INVAS[facility] CV LAB;  Service: Cardiovascular;  Laterality: N/A;   INCISION AND DRAINAGE Right 01/08/2023   Procedure: INCISION AND DRAINAGE OF RIGHT LEG  SEROMA, DRAIN PLACEMENT  AND MYRIAD MORCELLS;  Surgeon: Young Hensen, MD;  Location: [facility] OR;  Service: Vascular;  Laterality: Right;   IR IMAGING GUIDED PORT INSERTION  12/04/2021   KNEE ARTHROPLASTY Left 08/05/2017   Procedure: LEFT TOTAL KNEE ARTHROPLASTY WITH COMPUTER NAVIGATION;  Surgeon: Adonica Hoose, MD;  Location: [facility];  Service: Orthopedics;  Laterality: Left;  Needs RNFA   LIGATION OF CILIAC ARTERY Right 05/20/2022   Procedure: LIGATION OF RIGHT ANTERIOR TIBIAL ARTERY;  Surgeon: Young Hensen, MD;  Location[facility] OR;  Service: Vascular;  Laterality: Right;   LOWER EXTREMITY ANGIOGRAPHY Right 05/14/2022   Procedure: Lower Extremity Angiography;  Surgeon: Young Hensen, MD;  Location: [facility] INVASIVE CV LAB;  Service: Cardiovascular;  Laterality: Right;   PENILE PROSTHESIS IMPLANT     POLYPECTOMY  11/25/2021   Procedure: POLYPECTOMY;  Surgeon: Lajuan Pila, MD;  Location: [facility];  Service: Gastroenterology;;   REPLACEMENT TOTAL KNEE  06/12/2013   spinal cyst     THORACOTOMY     left anterior; wound exploration and debridement   TONSILLECTOMY AND ADENOIDECTOMY     age 62   VEIN HARVEST Right 05/20/2022   Procedure: RIGHT GREATER SAPHENOUS VEIN HARVEST;  Surgeon: Young Hensen, MD;  Location: [facility] OR;  Service: Vascular;  Laterality: Right;    reports that he quit smoking about 42 years ago. His smoking use included cigarettes. He started smoking about 59 years ago. He has a 68 pack-year smoking history. He has been exposed to tobacco smoke. He has never used smokeless tobacco. He reports current alcohol  use of about 2.0 standard drinks of alcohol  per week. He reports that he does not use drugs. family history includes COPD in his son; Colon cancer in his maternal grandfather; Heart disease in his brother; Lung cancer in his father; Pancreatic cancer in his brother, cousin, and maternal uncle; Stomach cancer in his mother. Allergies  Allergen Reactions   Ace Inhibitors Cough    Statins Other (See Comments)    Muscle pains   Current Outpatient Medications on File Prior to Visit  Medication Sig Dispense Refill   Ascorbic Acid  (VITA-C PO) Take 1,000 mg by mouth daily.     aspirin  EC 81 MG tablet Take 1 tablet (81 mg total) by mouth daily at 6 (six) AM. Swallow whole. 30 tablet 12   b complex vitamins capsule Take 1 capsule by mouth daily.     cholecalciferol  (VITAMIN D3) 25 MCG (1000 UNIT) tablet Take 1,000 Units by mouth daily.     ezetimibe  (ZETIA ) 10 MG tablet TAKE 1 TABLET EVERY DAY 90 tablet 2   furosemide  (LASIX ) 20 MG tablet TAKE 2 TABLETS EVERY  DAY 180 tablet 3   guaiFENesin  (MUCINEX ) 600 MG 12 hr tablet Take 2 tablets (1,200 mg total) by mouth 2 (two) times daily as needed. 60 tablet 2   losartan  (COZAAR ) 25 MG tablet TAKE 1 TABLET EVERY DAY 90 tablet 1   meclizine  (ANTIVERT ) 12.5 MG tablet Take 1 tablet (12.5 mg total) by mouth 3 (three) times daily as needed for dizziness. 40 tablet 1   meloxicam (MOBIC) 15 MG tablet Take 15 mg by mouth daily.     metoprolol  succinate (TOPROL  XL) 25 MG 24 hr tablet Take 1 tablet (25 mg total) by mouth daily. 90 tablet 0   Multiple Vitamins-Minerals (MULTIVITAMIN WITH MINERALS) tablet Take 1 tablet by mouth daily. Centrum silver     potassium chloride  SA (KLOR-CON  M) 20 MEQ tablet TAKE 1 TABLET EVERY DAY 90 tablet 3   rosuvastatin  (CRESTOR ) 5 MG tablet TAKE 1 TABLET EVERY OTHER DAY 45 tablet 2   traZODone  (DESYREL ) 50 MG tablet Take 0.5-1 tablets (25-50 mg total) by mouth at bedtime as needed. 90 tablet 1   warfarin (COUMADIN ) 5 MG tablet TAKE 1 TABLET EVERY DAY EXCEPT TAKE 1 AND 1/2 TABLETS ON MONDAYS OR AS DIRECTED 100 tablet 0   No current facility-administered medications on file prior to visit.        ROS:  All others reviewed and negative.  Objective        PE:  BP 132/78 (BP Location: Left Arm, Patient Position: Sitting, Cuff Size: Normal)   Pulse 63   Temp 97.8 F (36.6 C) (Oral)   Ht 5\' 10"  (1.778 m)   Wt  202 lb (91.6 kg)   SpO2 96%   BMI 28.98 kg/m                 Constitutional: Pt appears in NAD               HENT: Head: NCAT.                Right Ear: External ear normal.                 Left Ear: External ear normal.                Eyes: . Pupils are equal, round, and reactive to light. Conjunctivae and EOM are normal               Nose: without d/c or deformity               Neck: Neck supple. Gross normal ROM               Cardiovascular: Normal rate and regular rhythm.                 Pulmonary/Chest: Effort normal and breath sounds without rales or wheezing.                Abd:  Soft, NT, ND, + BS, no organomegaly               Neurological: Pt is alert. At baseline orientation, motor grossly intact               Skin: Skin is warm. No rashes, no other new lesions, LE edema - none               Psychiatric: Pt behavior is normal without agitation   Micro: none  Cardiac tracings I have personally interpreted today:  none  Pertinent  Radiological findings (summarize): none   Lab Results  Component Value Date   WBC 5.0 12/31/2023   HGB 14.7 12/31/2023   HCT 43.8 12/31/2023   PLT 114.0 (L) 12/31/2023   GLUCOSE 89 12/31/2023   CHOL 158 11/17/2023   TRIG 303.0 (H) 11/17/2023   HDL 39.70 11/17/2023   LDLDIRECT 102.0 10/09/2021   LDLCALC 58 11/17/2023   ALT 20 12/31/2023   AST 23 12/31/2023   NA 141 12/31/2023   K 4.3 12/31/2023   CL 100 12/31/2023   CREATININE 1.66 (H) 12/31/2023   BUN 27 (H) 12/31/2023   CO2 34 (H) 12/31/2023   TSH 2.52 11/17/2023   PSA 1.23 10/09/2021   INR 3.4 (A) 12/15/2023   HGBA1C 5.8 11/17/2023   Assessment/Plan:  [name] [name] is a 78 y.o. White or Caucasian [1] male with  has a past medical history of AICD (automatic cardioverter/defibrillator) present, ALLERGIC RHINITIS (10/31/2007), Allergy, Cancer (HCC) (12/03/2021), CHOLECYSTECTOMY, HX OF (06/04/2007), Chronic HFrEF (heart failure with reduced ejection fraction) (HCC), COLONIC  POLYPS, HX OF (10/31/2007), CORONARY ARTERY BYPASS GRAFT, HX OF (06/04/2007), CORONARY ARTERY DISEASE (06/04/2007), Diverticulosis, Erectile dysfunction, Eye twitch, History of hiatal hernia, HYPERLIPIDEMIA (06/04/2007), Hypersomnolence, HYPERTENSION (06/04/2007), Hypothyroidism, ISCHEMIC CARDIOMYOPATHY (06/04/2007), Ischemic cardiomyopathy, LV (left ventricular) mural thrombus (02/15/2019), Myocardial infarction (HCC), Osteoarthritis, Peripheral vascular disease (HCC), PLMD (periodic limb movement disorder), Pneumonia, Vertigo, and Vitamin D  deficiency.  Hyperlipidemia Lab Results  Component Value Date   LDLCALC 58 11/17/2023   Stable, pt to continue current statin crestor  5 mg q   Essential hypertension BP Readings from Last 3 Encounters:  12/31/23 132/78  11/30/23 (!) 111/58  11/17/23 118/74   Stable, pt to continue medical treatment losartan  25 mg every day, toprol  xl 25 qd   Vitamin D  deficiency Last vitamin D  Lab Results  Component Value Date   VD25OH 59.72 11/17/2023   Stable, cont oral replacement   Erectile dysfunction With worsening, for viagra  50 mg at bedtime prn  Cough Acute mild prod , can't r/o copd exacerbation with pna - for cxr, also levaquin  500 every day course, cough med prn, prednisone  taper, inhaler prn  Followup: Return if symptoms worsen or fail to improve.  Rosalia Colonel, MD 01/02/2024 2:50 PM [facility]  Primary Care - [facility] Internal Medicine

## 2023-12-31 NOTE — Patient Instructions (Signed)
 Please take all new medication as prescribed - the antibiotic,cough medicine, prednisone , and inhaler as needed  Please continue all other medications as before, and refills have been done if requested.  Please have the pharmacy call with any other refills you may need.  Please keep your appointments with your specialists as you may have planned  Please go to the XRAY Department in the first floor for the x-ray testing  Please go to the LAB at the blood drawing area for the tests to be done  You will be contacted by phone if any changes need to be made immediately.  Otherwise, you will receive a letter about your results with an explanation, but please check with MyChart first.

## 2024-01-02 ENCOUNTER — Encounter: Payer: Self-pay | Admitting: Internal Medicine

## 2024-01-02 NOTE — Assessment & Plan Note (Signed)
 Lab Results  Component Value Date   LDLCALC 58 11/17/2023   Stable, pt to continue current statin crestor  5 mg q

## 2024-01-02 NOTE — Assessment & Plan Note (Signed)
 Last vitamin D Lab Results  Component Value Date   VD25OH 59.72 11/17/2023   Stable, cont oral replacement

## 2024-01-02 NOTE — Assessment & Plan Note (Signed)
 Acute mild prod , can't r/o copd exacerbation with pna - for cxr, also levaquin  500 every day course, cough med prn, prednisone  taper, inhaler prn

## 2024-01-02 NOTE — Assessment & Plan Note (Signed)
 BP Readings from Last 3 Encounters:  12/31/23 132/78  11/30/23 (!) 111/58  11/17/23 118/74   Stable, pt to continue medical treatment losartan  25 mg every day, toprol  xl 25 qd

## 2024-01-02 NOTE — Assessment & Plan Note (Signed)
 With worsening, for viagra  50 mg at bedtime prn

## 2024-01-03 ENCOUNTER — Ambulatory Visit: Payer: Self-pay | Admitting: Internal Medicine

## 2024-01-03 DIAGNOSIS — M79673 Pain in unspecified foot: Secondary | ICD-10-CM

## 2024-01-10 ENCOUNTER — Ambulatory Visit: Attending: Cardiology | Admitting: *Deleted

## 2024-01-10 DIAGNOSIS — Z5181 Encounter for therapeutic drug level monitoring: Secondary | ICD-10-CM | POA: Diagnosis not present

## 2024-01-10 DIAGNOSIS — I513 Intracardiac thrombosis, not elsewhere classified: Secondary | ICD-10-CM | POA: Diagnosis not present

## 2024-01-10 LAB — POCT INR: INR: 2.4 (ref 2.0–3.0)

## 2024-01-10 NOTE — Patient Instructions (Signed)
 Has been taking Levaquin , cough syrup and prednisone  for pneumonia.  Has not been taking any warfarin on Tuesdays and Fridays Restart warfarin 1 tablet daily except 1/2 tablet on Tuesdays and Fridays Continue greens Recheck in 2 wks

## 2024-01-13 ENCOUNTER — Other Ambulatory Visit (HOSPITAL_COMMUNITY): Payer: Medicare HMO

## 2024-01-14 ENCOUNTER — Encounter: Payer: Self-pay | Admitting: Cardiovascular Disease

## 2024-01-14 ENCOUNTER — Ambulatory Visit: Payer: Self-pay | Admitting: Cardiovascular Disease

## 2024-01-14 ENCOUNTER — Ambulatory Visit: Attending: Cardiovascular Disease | Admitting: Cardiovascular Disease

## 2024-01-14 VITALS — BP 138/70 | HR 73 | Ht 71.0 in | Wt 204.6 lb

## 2024-01-14 DIAGNOSIS — R001 Bradycardia, unspecified: Secondary | ICD-10-CM

## 2024-01-14 LAB — CUP PACEART INCLINIC DEVICE CHECK
Battery Remaining Longevity: 75 mo
Battery Voltage: 2.99 V
Brady Statistic AP VP Percent: 0.02 %
Brady Statistic AP VS Percent: 34.58 %
Brady Statistic AS VP Percent: 0.02 %
Brady Statistic AS VS Percent: 65.38 %
Brady Statistic RA Percent Paced: 34.07 %
Brady Statistic RV Percent Paced: 0.04 %
Date Time Interrogation Session: 20250606151342
HighPow Impedance: 68 Ohm
Implantable Lead Connection Status: 753985
Implantable Lead Connection Status: 753985
Implantable Lead Implant Date: 20210622
Implantable Lead Implant Date: 20210622
Implantable Lead Location: 753859
Implantable Lead Location: 753860
Implantable Lead Model: 5076
Implantable Pulse Generator Implant Date: 20210622
Lead Channel Impedance Value: 304 Ohm
Lead Channel Impedance Value: 418 Ohm
Lead Channel Impedance Value: 475 Ohm
Lead Channel Pacing Threshold Amplitude: 0.5 V
Lead Channel Pacing Threshold Amplitude: 0.625 V
Lead Channel Pacing Threshold Amplitude: 0.75 V
Lead Channel Pacing Threshold Pulse Width: 0.4 ms
Lead Channel Pacing Threshold Pulse Width: 0.4 ms
Lead Channel Pacing Threshold Pulse Width: 0.4 ms
Lead Channel Sensing Intrinsic Amplitude: 11.5 mV
Lead Channel Sensing Intrinsic Amplitude: 4 mV
Lead Channel Sensing Intrinsic Amplitude: 4.75 mV
Lead Channel Sensing Intrinsic Amplitude: 8.375 mV
Lead Channel Setting Pacing Amplitude: 1.5 V
Lead Channel Setting Pacing Amplitude: 2.5 V
Lead Channel Setting Pacing Pulse Width: 0.4 ms
Lead Channel Setting Sensing Sensitivity: 0.3 mV
Zone Setting Status: 755011

## 2024-01-14 MED ORDER — METOPROLOL SUCCINATE ER 50 MG PO TB24: 50.0000 mg | ORAL_TABLET | Freq: Every day | ORAL | 3 refills | Status: DC

## 2024-01-14 NOTE — Progress Notes (Signed)
 Electrophysiology Office Note:    Date:  01/14/2024   ID:  [name] [name], [name] [name] Oct 12, 1945, MRN [id number]  PCP:  Roslyn Coombe, MD   [facility] Providers Cardiologist:  Alexandria Angel, MD Electrophysiologist:  Efraim Grange, MD     Referring MD: Roslyn Coombe, MD   History of Present Illness:    [name] [name] is a 78 y.o. male with a medical history significant for ischemic cardiomyopathy, CHF with reduced EF, apical thrombus who presents today as an urgent visit after an episode of sustained VT.     He has heart history dates back to 1998 when he presented with an acute myocardial infarction.  He underwent minimally invasive LIMA to LAD.  Cardiac MRI in July 2020 showed ejection fraction of 30% with apical thrombus. Medtronic dual-chamber ICD was placed by Dr. Nunzio Belch on January 30, 2020.   He notes that he had discontinued his metoprolol  while under treatment for cancer.  Patiently, he had dizziness while taking 50 mg dose.  He has had episodes of device detected VT that were completely asymptomatic.  At the time of the event he was returning from a vacation at the [facility] and had an acute enteritis just previously.      He is seen today in routine follow-up  he has no device related complaints -- no new tenderness, drainage, redness.  He has been noticing shortness of breath with exertion.  He notes that he was recently treated for pneumonia.  He was given albuterol  which did not appreciably improve his fatigue but did "make his head feel like it was about to come off."  EKGs/Labs/Other Studies Reviewed Today:    Echocardiogram:  TTE 11/26/2023 EF 40 to 45%.  Regional wall motion abnormalities are present.  Grade 1 diastolic dysfunction.   Monitors:   Stress testing:   Advanced imaging:   Cardiac catherization   EKG:   EKG Interpretation Date/Time:  Friday January 14 2024 09:51:32 EDT Ventricular Rate:  73 PR Interval:    QRS  Duration:  132 QT Interval:  426 QTC Calculation: 469 R Axis:   -51  Text Interpretation: Atrial-paced rhythm with frequent sinus complexes and frequent PVCs Left axis deviation Non-specific intra-ventricular conduction block Minimal voltage criteria for LVH, may be normal variant ( Cornell product ) Cannot rule out Anteroseptal infarct (cited on or before 05-Feb-2023) T wave abnormality, consider lateral ischemia When compared with ECG of 05-Feb-2023 12:00, PVCs more frequent Confirmed by Marlane Silver [phone]) on 01/14/2024 10:11:20 AM     Physical Exam:    VS:  BP 138/70 (BP Location: Left Arm)   Pulse 73   Ht 5\' 11"  (1.803 m)   Wt 204 lb 9.6 oz (92.8 kg)   SpO2 99%   BMI 28.54 kg/m     Wt Readings from Last 3 Encounters:  01/14/24 204 lb 9.6 oz (92.8 kg)  12/31/23 202 lb (91.6 kg)  11/17/23 206 lb (93.4 kg)     GEN:  Well nourished, well developed in no acute distress CARDIAC: RRR, no murmurs, rubs, gallops RESPIRATORY:  Normal work of breathing MUSCULOSKELETAL: no edema    ASSESSMENT & PLAN:    CHFrEF Appears euvolemic and compensated today EF is improved from previous, now 40 to 45% Continue losartan  25 (did not tolerate entresto ), metoprolol  XL, empagliflozin  10 --will increase metoprolol  XL to 50 mg daily  VT  Asymptomatic, device detected with a VF zone of 200 in the past VF zone  was reprogrammed to a rate of 220 Restart metoprolol  XL 25 (he was dizzy with 50)  PVCs and PACs Increase metoprolol  XL 50 mg Possibly exacerbated by albuterol   History of LV thrombus On warfarin  Coronary disease Continue rosuvastatin  5, aspirin  81  Signed, Efraim Grange, MD  01/14/2024 10:13 AM    [facility]

## 2024-01-14 NOTE — Patient Instructions (Signed)
 Medication Instructions:  Your physician has recommended you make the following change in your medication:   ** Stop Metoprolol  Succinate 25mg   ** Begin Metoprolol  Succinate 50mg  - 1 tablet by mouth daily.  *If you need a refill on your cardiac medications before your next appointment, please call your pharmacy*  Lab Work: None ordered.  If you have labs (blood work) drawn today and your tests are completely normal, you will receive your results only by: MyChart Message (if you have MyChart) OR A paper copy in the mail If you have any lab test that is abnormal or we need to change your treatment, we will call you to review the results.  Testing/Procedures: None ordered.   Follow-Up: At [facility], you and your health needs are our priority.  As part of our continuing mission to provide you with exceptional heart care, our providers are all part of one team.  This team includes your primary Cardiologist (physician) and Advanced Practice Providers or APPs (Physician Assistants and Nurse Practitioners) who all work together to provide you with the care you need, when you need it.  Your next appointment:   12 months with Dr Mealor

## 2024-01-18 ENCOUNTER — Ambulatory Visit (HOSPITAL_COMMUNITY)
Admission: RE | Admit: 2024-01-18 | Discharge: 2024-01-18 | Disposition: A | Discharge status: Home / Self Care | Payer: Medicare HMO | Source: Ambulatory Visit | Attending: Vascular Surgery | Admitting: Vascular Surgery

## 2024-01-18 ENCOUNTER — Encounter: Payer: Self-pay | Admitting: Vascular Surgery

## 2024-01-18 ENCOUNTER — Ambulatory Visit: Payer: Medicare HMO | Attending: Vascular Surgery | Admitting: Vascular Surgery

## 2024-01-18 VITALS — BP 132/79 | HR 61 | Temp 97.9°F | Resp 18 | Ht 71.0 in | Wt 200.4 lb

## 2024-01-18 DIAGNOSIS — I739 Peripheral vascular disease, unspecified: Secondary | ICD-10-CM | POA: Diagnosis not present

## 2024-01-18 DIAGNOSIS — I724 Aneurysm of artery of lower extremity: Secondary | ICD-10-CM | POA: Diagnosis not present

## 2024-01-18 LAB — VAS US ABI WITH/WO TBI
Left ABI: 1.36
Right ABI: 1.19

## 2024-01-18 NOTE — Progress Notes (Signed)
 Patient name: [name] [name] MRN: [id number] DOB: Sep 19, 1945 Sex: male  REASON FOR CONSULT: 89-month follow-up for surveillance of right leg bypass  HPI: [name] [name] is a 78 y.o. male, with history of lymphoma undergoing chemotherapy, hypertension, hyperlipidemia, coronary artery disease status post CABG that presents for 69-month follow-up for surveillance of his right leg bypass. 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  This was complicated by a seroma that required I&D.  No new complaints today other than some numbness on the bottom of his foot.  Walking without issue and no leg pain.   Past Medical History:  Diagnosis Date   AICD (automatic cardioverter/defibrillator) present    ALLERGIC RHINITIS 10/31/2007   Qualifier: Diagnosis of  By: Autry Legions MD, Alveda Aures    Allergy    Cancer (HCC) 12/03/2021   non-Hodgkin's B-cell lymphoma   CHOLECYSTECTOMY, HX OF 06/04/2007   Qualifier: Diagnosis of  By: Sharlette Dayhoff CMA, [place]     Chronic HFrEF (heart failure with reduced ejection fraction) (HCC)    COLONIC POLYPS, HX OF 10/31/2007   Qualifier: Diagnosis of  By: Autry Legions MD, Alveda Aures    CORONARY ARTERY BYPASS GRAFT, HX OF 06/04/2007   Qualifier: Diagnosis of  By: Sharlette Dayhoff CMA, [place]     CORONARY ARTERY DISEASE 06/04/2007   Qualifier: Diagnosis of  By: Sharlette Dayhoff CMA, [place]     Diverticulosis    Erectile dysfunction    Eye twitch    right eye since chilhood    History of hiatal hernia    HYPERLIPIDEMIA 06/04/2007   Qualifier: Diagnosis of  By: Sharlette Dayhoff CMA, [place]     Hypersomnolence    HYPERTENSION 06/04/2007   Qualifier: Diagnosis of  By: Sharlette Dayhoff CMA, [place]     Hypothyroidism    pt does not take medications and doesn't know if he has it   ISCHEMIC CARDIOMYOPATHY 06/04/2007   Qualifier: Diagnosis of  By: Sharlette Dayhoff CMA, [place]     Ischemic  cardiomyopathy    LV (left ventricular) mural thrombus 02/15/2019   Myocardial infarction [facility])    per patient , his occurred in 1998    Osteoarthritis    Peripheral vascular disease (HCC)    PLMD (periodic limb movement disorder)    Pneumonia    Vertigo    Vitamin D  deficiency     Past Surgical History:  Procedure Laterality Date   BIOPSY  11/25/2021   Procedure: BIOPSY;  Surgeon: Lajuan Pila, MD;  Location: [facility];  Service: Gastroenterology;;   [facility]ABG  1999   [facility] [place], [place].  Patient reports that a small vein was taken from under his arm and a small incesion was made.   CHOLECYSTECTOMY     COLONOSCOPY     COLONOSCOPY WITH PROPOFOL  N/A 11/25/2021   Procedure: COLONOSCOPY WITH PROPOFOL ;  Surgeon: Lajuan Pila, MD;  Location: [facility];  Service: Gastroenterology;  Laterality: N/A;   ENDOVASCULAR REPAIR OF POPLITEAL ARTERY ANEURYSM Right 05/20/2022   Procedure: LIGATION OF POPLITEAL ARTERY ANEURYSM;  Surgeon: Young Hensen, MD;  Location: [facility] OR;  Service: Vascular;  Laterality: Right;   ESOPHAGOGASTRODUODENOSCOPY (EGD) WITH PROPOFOL  N/A 11/25/2021   Procedure: ESOPHAGOGASTRODUODENOSCOPY (EGD) WITH PROPOFOL ;  Surgeon: Lajuan Pila, MD;  Location: [facility];  Service: Gastroenterology;  Laterality: N/A;   FEMORAL-POPLITEAL BYPASS GRAFT Right 05/20/2022   Procedure: RIGHT ABOVE KNEE  POPLITEAL ARTERY TO TIBIOPERONEAL TRUNK/POSTERIOR TIBIAL ARTERY BYPASS;  Surgeon: Young Hensen, MD;  Location: [facility] OR;  Service: Vascular;  Laterality: Right;   ICD IMPLANT N/A 01/30/2020   Procedure: ICD IMPLANT;  Surgeon: Jolly Needle, MD;  Location: [facility];  Service: Cardiovascular;  Laterality: N/A;   INCISION AND DRAINAGE Right 01/08/2023   Procedure: INCISION AND DRAINAGE OF RIGHT LEG  SEROMA, DRAIN PLACEMENT AND MYRIAD MORCELLS;  Surgeon: Young Hensen, MD;  Location: [facility] OR;  Service: Vascular;  Laterality: Right;    IR IMAGING GUIDED PORT INSERTION  12/04/2021   KNEE ARTHROPLASTY Left 08/05/2017   Procedure: LEFT TOTAL KNEE ARTHROPLASTY WITH COMPUTER NAVIGATION;  Surgeon: Adonica Hoose, MD;  Location: [facility];  Service: Orthopedics;  Laterality: Left;  Needs RNFA   LIGATION OF CILIAC ARTERY Right 05/20/2022   Procedure: LIGATION OF RIGHT ANTERIOR TIBIAL ARTERY;  Surgeon: Young Hensen, MD;  Location: [facility] OR;  Service: Vascular;  Laterality: Right;   LOWER EXTREMITY ANGIOGRAPHY Right 05/14/2022   Procedure: Lower Extremity Angiography;  Surgeon: Young Hensen, MD;  Location: [facility] INVASIVE CV LAB;  Service: Cardiovascular;  Laterality: Right;   PENILE PROSTHESIS IMPLANT     POLYPECTOMY  11/25/2021   Procedure: POLYPECTOMY;  Surgeon: Lajuan Pila, MD;  Location: [facility];  Service: Gastroenterology;;   REPLACEMENT TOTAL KNEE  06/12/2013   spinal cyst     THORACOTOMY     left anterior; wound exploration and debridement   TONSILLECTOMY AND ADENOIDECTOMY     age 89   VEIN HARVEST Right 05/20/2022   Procedure: RIGHT GREATER SAPHENOUS VEIN HARVEST;  Surgeon: Young Hensen, MD;  Location: [facility] OR;  Service: Vascular;  Laterality: Right;    Family History  Problem Relation Age of Onset   Stomach cancer Mother        smokes   Lung cancer Father        chewed tobacco   Heart disease Brother        first MI at 59yo, now 31 for transplant list/ ICM   Pancreatic cancer Brother    Colon cancer Maternal Grandfather    COPD Son        was a smoker   Pancreatic cancer Maternal Uncle    Pancreatic cancer Cousin        mat side x 4   Esophageal cancer Neg Hx    Prostate cancer Neg Hx    Rectal cancer Neg Hx     SOCIAL HISTORY: Social History   Socioeconomic History   Marital status: Married    Spouse name: Ninette Basque   Number of children: 3   Years of education: Not on file   Highest education level: Not on file  Occupational History   Occupation: truck Air traffic controller:  [employer]  Tobacco Use   Smoking status: Former    Current packs/day: 0.00    Average packs/day: 4.0 packs/day for 17.0 years (68.0 ttl pk-yrs)    Types: Cigarettes    Start date: 05/07/1964    Quit date: 05/07/1981    Years since quitting: 42.7    Passive exposure: Past   Smokeless tobacco: Never   Tobacco comments:    Pt states that he would let most of them "burn" pt states that he used anywhere between 4-5PPD  Vaping Use   Vaping status: Never Used  Substance and Sexual Activity   Alcohol  use: Yes    Alcohol /week: 2.0 standard drinks of alcohol   Types: 2 Cans of beer per week    Comment: occ   Drug use: No   Sexual activity: Not on file  Other Topics Concern   Not on file  Social History Narrative   Lives in [place] with spouse   Retired Naval architect   Social Drivers of Corporate investment banker Strain: Low Risk  (11/01/2023)   Overall Financial Resource Strain (CARDIA)    Difficulty of Paying Living Expenses: Not hard at all  Food Insecurity: No Food Insecurity (11/01/2023)   Hunger Vital Sign    Worried About Running Out of Food in the Last Year: Never true    Ran Out of Food in the Last Year: Never true  Transportation Needs: No Transportation Needs (11/01/2023)   PRAPARE - Administrator, Civil Service (Medical): No    Lack of Transportation (Non-Medical): No  Physical Activity: Sufficiently Active (11/01/2023)   Exercise Vital Sign    Days of Exercise per Week: 3 days    Minutes of Exercise per Session: 60 min  Stress: No Stress Concern Present (11/01/2023)   [employer] of Occupational Health - Occupational Stress Questionnaire    Feeling of Stress : Not at all  Social Connections: Moderately Isolated (11/01/2023)   Social Connection and Isolation Panel [NHANES]    Frequency of Communication with Friends and Family: More than three times a week    Frequency of Social Gatherings with Friends and Family: Once a week    Attends  Religious Services: Never    Database administrator or Organizations: No    Attends Banker Meetings: Never    Marital Status: Married  Catering manager Violence: Not At Risk (11/01/2023)   Humiliation, Afraid, Rape, and Kick questionnaire    Fear of Current or Ex-Partner: No    Emotionally Abused: No    Physically Abused: No    Sexually Abused: No    Allergies  Allergen Reactions   Ace Inhibitors Cough   Statins Other (See Comments)    Muscle pains    Current Outpatient Medications  Medication Sig Dispense Refill   albuterol  (VENTOLIN  HFA) 108 (90 Base) MCG/ACT inhaler Inhale 2 puffs into the lungs every 6 (six) hours as needed for wheezing or shortness of breath. 8 g 0   Ascorbic Acid  (VITA-C PO) Take 1,000 mg by mouth daily.     aspirin  EC 81 MG tablet Take 1 tablet (81 mg total) by mouth daily at 6 (six) AM. Swallow whole. 30 tablet 12   b complex vitamins capsule Take 1 capsule by mouth daily.     cholecalciferol  (VITAMIN D3) 25 MCG (1000 UNIT) tablet Take 1,000 Units by mouth daily.     ezetimibe  (ZETIA ) 10 MG tablet TAKE 1 TABLET EVERY DAY 90 tablet 2   furosemide  (LASIX ) 20 MG tablet TAKE 2 TABLETS EVERY DAY 180 tablet 3   guaiFENesin  (MUCINEX ) 600 MG 12 hr tablet Take 2 tablets (1,200 mg total) by mouth 2 (two) times daily as needed. 60 tablet 2   levofloxacin  (LEVAQUIN ) 500 MG tablet Take 1 tablet (500 mg total) by mouth daily. 10 tablet 0   losartan  (COZAAR ) 25 MG tablet TAKE 1 TABLET EVERY DAY 90 tablet 1   meclizine  (ANTIVERT ) 12.5 MG tablet Take 1 tablet (12.5 mg total) by mouth 3 (three) times daily as needed for dizziness. 40 tablet 1   meloxicam (MOBIC) 15 MG tablet Take 15 mg by mouth daily.  metoprolol  succinate (TOPROL -XL) 50 MG 24 hr tablet Take 1 tablet (50 mg total) by mouth daily. Take with or immediately following a meal. 90 tablet 3   Multiple Vitamins-Minerals (MULTIVITAMIN WITH MINERALS) tablet Take 1 tablet by mouth daily. Centrum silver      potassium chloride  SA (KLOR-CON  M) 20 MEQ tablet TAKE 1 TABLET EVERY DAY 90 tablet 3   rosuvastatin  (CRESTOR ) 5 MG tablet TAKE 1 TABLET EVERY OTHER DAY 45 tablet 2   sildenafil  (VIAGRA ) 100 MG tablet Take 0.5-1 tablets (50-100 mg total) by mouth daily as needed. 10 tablet 11   traZODone  (DESYREL ) 50 MG tablet Take 0.5-1 tablets (25-50 mg total) by mouth at bedtime as needed. 90 tablet 1   warfarin (COUMADIN ) 5 MG tablet TAKE 1 TABLET EVERY DAY EXCEPT TAKE 1 AND 1/2 TABLETS ON MONDAYS OR AS DIRECTED 100 tablet 0   predniSONE  (DELTASONE ) 10 MG tablet 3 tabs by mouth per day for 3 days,2tabs per day for 3 days,1tab per day for 3 days (Patient not taking: Reported on 01/18/2024) 18 tablet 0   No current facility-administered medications for this visit.    REVIEW OF SYSTEMS:  [X]  denotes positive finding, [ ]  denotes negative finding Cardiac  Comments:  Chest pain or chest pressure:    Shortness of breath upon exertion:    Short of breath when lying flat:    Irregular heart rhythm:        Vascular    Pain in calf, thigh, or hip brought on by ambulation:    Pain in feet at night that wakes you up from your sleep:     Blood clot in your veins:    Leg swelling:         Pulmonary    Oxygen at home:    Productive cough:     Wheezing:         Neurologic    Sudden weakness in arms or legs:     Sudden numbness in arms or legs:     Sudden onset of difficulty speaking or slurred speech:    Temporary loss of vision in one eye:     Problems with dizziness:         Gastrointestinal    Blood in stool:     Vomited blood:         Genitourinary    Burning when urinating:     Blood in urine:        Psychiatric    Major depression:         Hematologic    Bleeding problems:    Problems with blood clotting too easily:        Skin    Rashes or ulcers:        Constitutional    Fever or chills:      PHYSICAL EXAM: Vitals:   01/18/24 1522  BP: 132/79  Pulse: 61  Resp: 18  Temp:  97.9 F (36.6 C)  TempSrc: Temporal  SpO2: 97%  Weight: 200 lb 6.4 oz (90.9 kg)  Height: 5\' 11"  (1.803 m)    GENERAL: The patient is a well-nourished male, in no acute distress. The vital signs are documented above. CARDIAC: There is a regular rate and rhythm.  VASCULAR:  Right femoral pulse palpable Right DP palpable Right leg incisions well healed PULMONARY: No respiratory distress ABDOMEN: Soft and non-tender. MUSCULOSKELETAL: There are no major deformities or cyanosis. NEUROLOGIC: No focal weakness or paresthesias are detected. SKIN: There are no ulcers  or rashes noted. PSYCHIATRIC: The patient has a normal affect.  DATA:   Right leg duplex today shows widely patent bypass  ABIs today are 1.19 on the right biphasic and 1.36 on the left triphasic  Assessment/Plan:  78 y.o. male, with history of lymphoma undergoing chemotherapy, hypertension, hyperlipidemia, coronary artery disease status post CABG that presents for follow-up of his right leg bypass.  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  This was complicated by a seroma that required I&D. Bypass remains widely patent on duplex today.  No evidence of abdominal aneurysm or contralateral left popliteal aneurysm on prior CT imaging that I again reviewed today.  Will arrange follow-up in 1 year with right leg arterial duplex and ABIs.  Young Hensen, MD [facility] Office: [phone]

## 2024-01-24 ENCOUNTER — Ambulatory Visit: Attending: Cardiovascular Disease

## 2024-01-24 DIAGNOSIS — I5022 Chronic systolic (congestive) heart failure: Secondary | ICD-10-CM

## 2024-01-24 DIAGNOSIS — Z95810 Presence of automatic (implantable) cardiac defibrillator: Secondary | ICD-10-CM | POA: Diagnosis not present

## 2024-01-28 NOTE — Progress Notes (Signed)
 EPIC Encounter for ICM Monitoring  Patient Name: [name] [name] is a 78 y.o. male Date: 01/28/2024 Primary Care Physican: Roslyn Coombe, MD Primary Cardiologist: Audery Blazing Electrophysiologist: Meal[place] 04/20/2022 Not weighing at home 05/07/2022 Weight: 181 lbs (does not weigh consistently at home) 09/29/2022 Office Weight: 190 lbs 11/17/2022 Weight: 185 lbs 04/05/2023 Office Weight: 192 lbs       08/03/2023 Weight: 190 lbs   11/16/2023 Weight: 199 lbs (he reports is baseline)      01/18/2024 Office Weight: 200 lbs                                   Spoke with patient and heart failure questions reviewed.  Transmission results reviewed.  Pt asymptomatic for fluid accumulation. He reports weakness in his legs and has difficulty walking.      DIET:  Pt uses no salt at home and occasionally eats out.       Optivol thoracic impedance suggesting normal fluid levels within the last month.      Prescribed: Furosemide  20 mg take 2 tablet(s) (40 mg total) by mouth daily   Jardiance  10 mg take 1 tablet by mouth daily   Labs: 12/31/2023 Creatinine 1.66, BUN 27, Potassium 4.3, Sodium 141, GFR 39.17  11/17/2023 Creatinine 1.52, BUN 28, Potassium 4.5, Sodium 140, GFR 43.58  11/16/2023 Creatinine 1.35, BUN 26, Potassium 4.3, Sodium 139, GFR 54 08/30/2023 Creatinine 1.35, BUN 32, Potassium 4.1, Sodium 135, GFR 54  A complete set of results can be found in Results Review.   Recommendations:   No changes and encouraged to call if experiencing any fluid symptoms.   Follow-up plan: ICM clinic phone appointment on 03/13/2024.   91 day device clinic remote transmission 02/01/2024.     EP/Cardiology Office Visits:   Recall 09/22/2024 with Dr Audery Blazing.   Recall 01/08/2025 with Dr Arlester Ladd.    Copy of ICM check sent to Dr. Arlester Ladd.   3 month ICM trend: 01/24/2024.    12-14 Month ICM trend:     Almyra Jain, RN 01/28/2024 3:54 PM

## 2024-01-31 ENCOUNTER — Telehealth: Payer: Self-pay | Admitting: Internal Medicine

## 2024-01-31 NOTE — Telephone Encounter (Signed)
 Copied from CRM [id number]. Topic: Referral - Status >> Jan 31, 2024 11:07 AM Franky GRADE wrote: Reason for CRM: Patient is upset because the ortho referral was not sent or received by the correct clinic [facility]. I called [facility] and was informed that is was sent out but they would contact the referral coordinator for an update and inform the patient. Call patient with a follow up as he has an appointment scheduled for next week.

## 2024-01-31 NOTE — Telephone Encounter (Signed)
 [facility] . Please re-send

## 2024-01-31 NOTE — Telephone Encounter (Signed)
 Ok hopefully this is not an issue since his appt is next week.   Thanks

## 2024-02-01 ENCOUNTER — Ambulatory Visit (INDEPENDENT_AMBULATORY_CARE_PROVIDER_SITE_OTHER): Payer: Medicare HMO

## 2024-02-01 DIAGNOSIS — I255 Ischemic cardiomyopathy: Secondary | ICD-10-CM

## 2024-02-01 LAB — CUP PACEART REMOTE DEVICE CHECK
Battery Remaining Longevity: 74 mo
Battery Voltage: 2.99 V
Brady Statistic AP VP Percent: 0.02 %
Brady Statistic AP VS Percent: 36.82 %
Brady Statistic AS VP Percent: 0.03 %
Brady Statistic AS VS Percent: 63.14 %
Brady Statistic RA Percent Paced: 36.22 %
Brady Statistic RV Percent Paced: 0.04 %
Date Time Interrogation Session: 20250624033423
HighPow Impedance: 74 Ohm
Implantable Lead Connection Status: 753985
Implantable Lead Connection Status: 753985
Implantable Lead Implant Date: 20210622
Implantable Lead Implant Date: 20210622
Implantable Lead Location: 753859
Implantable Lead Location: 753860
Implantable Lead Model: 5076
Implantable Pulse Generator Implant Date: 20210622
Lead Channel Impedance Value: 304 Ohm
Lead Channel Impedance Value: 361 Ohm
Lead Channel Impedance Value: 399 Ohm
Lead Channel Pacing Threshold Amplitude: 0.5 V
Lead Channel Pacing Threshold Amplitude: 0.625 V
Lead Channel Pacing Threshold Pulse Width: 0.4 ms
Lead Channel Pacing Threshold Pulse Width: 0.4 ms
Lead Channel Sensing Intrinsic Amplitude: 3.5 mV
Lead Channel Sensing Intrinsic Amplitude: 3.5 mV
Lead Channel Sensing Intrinsic Amplitude: 8.25 mV
Lead Channel Sensing Intrinsic Amplitude: 8.25 mV
Lead Channel Setting Pacing Amplitude: 1.5 V
Lead Channel Setting Pacing Amplitude: 2.5 V
Lead Channel Setting Pacing Pulse Width: 0.4 ms
Lead Channel Setting Sensing Sensitivity: 0.3 mV
Zone Setting Status: 755011

## 2024-02-04 ENCOUNTER — Ambulatory Visit: Payer: Self-pay | Admitting: *Deleted

## 2024-02-04 ENCOUNTER — Encounter: Payer: Self-pay | Admitting: Internal Medicine

## 2024-02-04 ENCOUNTER — Ambulatory Visit: Payer: Self-pay | Admitting: Internal Medicine

## 2024-02-04 ENCOUNTER — Ambulatory Visit (INDEPENDENT_AMBULATORY_CARE_PROVIDER_SITE_OTHER): Admitting: Internal Medicine

## 2024-02-04 ENCOUNTER — Other Ambulatory Visit (INDEPENDENT_AMBULATORY_CARE_PROVIDER_SITE_OTHER)

## 2024-02-04 VITALS — BP 120/68 | HR 64 | Temp 98.0°F | Ht 71.0 in | Wt 200.0 lb

## 2024-02-04 DIAGNOSIS — R1032 Left lower quadrant pain: Secondary | ICD-10-CM

## 2024-02-04 DIAGNOSIS — I10 Essential (primary) hypertension: Secondary | ICD-10-CM | POA: Diagnosis not present

## 2024-02-04 DIAGNOSIS — E559 Vitamin D deficiency, unspecified: Secondary | ICD-10-CM

## 2024-02-04 DIAGNOSIS — R739 Hyperglycemia, unspecified: Secondary | ICD-10-CM

## 2024-02-04 LAB — CBC WITH DIFFERENTIAL/PLATELET
Basophils Absolute: 0.1 10*3/uL (ref 0.0–0.1)
Basophils Relative: 1 % (ref 0.0–3.0)
Eosinophils Absolute: 0.3 10*3/uL (ref 0.0–0.7)
Eosinophils Relative: 4.4 % (ref 0.0–5.0)
HCT: 41 % (ref 39.0–52.0)
Hemoglobin: 13.8 g/dL (ref 13.0–17.0)
Lymphocytes Relative: 50.9 % — ABNORMAL HIGH (ref 12.0–46.0)
Lymphs Abs: 2.9 10*3/uL (ref 0.7–4.0)
MCHC: 33.6 g/dL (ref 30.0–36.0)
MCV: 93.3 fl (ref 78.0–100.0)
Monocytes Absolute: 0.6 10*3/uL (ref 0.1–1.0)
Monocytes Relative: 10.8 % (ref 3.0–12.0)
Neutro Abs: 1.9 10*3/uL (ref 1.4–7.7)
Neutrophils Relative %: 32.9 % — ABNORMAL LOW (ref 43.0–77.0)
Platelets: 146 10*3/uL — ABNORMAL LOW (ref 150.0–400.0)
RBC: 4.39 Mil/uL (ref 4.22–5.81)
RDW: 13.9 % (ref 11.5–15.5)
WBC: 5.7 10*3/uL (ref 4.0–10.5)

## 2024-02-04 LAB — HEPATIC FUNCTION PANEL
ALT: 24 U/L (ref 0–53)
AST: 24 U/L (ref 0–37)
Albumin: 4.3 g/dL (ref 3.5–5.2)
Alkaline Phosphatase: 55 U/L (ref 39–117)
Bilirubin, Direct: 0.2 mg/dL (ref 0.0–0.3)
Total Bilirubin: 0.9 mg/dL (ref 0.2–1.2)
Total Protein: 7.1 g/dL (ref 6.0–8.3)

## 2024-02-04 LAB — URINALYSIS, ROUTINE W REFLEX MICROSCOPIC
Bilirubin Urine: NEGATIVE
Hgb urine dipstick: NEGATIVE
Ketones, ur: NEGATIVE
Leukocytes,Ua: NEGATIVE
Nitrite: NEGATIVE
Specific Gravity, Urine: 1.015 (ref 1.000–1.030)
Total Protein, Urine: NEGATIVE
Urine Glucose: NEGATIVE
Urobilinogen, UA: 0.2 (ref 0.0–1.0)
pH: 6 (ref 5.0–8.0)

## 2024-02-04 LAB — BASIC METABOLIC PANEL WITH GFR
BUN: 32 mg/dL — ABNORMAL HIGH (ref 6–23)
CO2: 36 meq/L — ABNORMAL HIGH (ref 19–32)
Calcium: 9.3 mg/dL (ref 8.4–10.5)
Chloride: 101 meq/L (ref 96–112)
Creatinine, Ser: 1.37 mg/dL (ref 0.40–1.50)
GFR: 49.29 mL/min — ABNORMAL LOW (ref >60.00)
Glucose, Bld: 103 mg/dL — ABNORMAL HIGH (ref 70–99)
Potassium: 4.3 meq/L (ref 3.5–5.1)
Sodium: 141 meq/L (ref 135–145)

## 2024-02-04 LAB — LIPASE: Lipase: 23 U/L (ref 11.0–59.0)

## 2024-02-04 MED ORDER — METRONIDAZOLE 500 MG PO TABS: 500.0000 mg | ORAL_TABLET | Freq: Three times a day (TID) | ORAL | 0 refills | Status: DC

## 2024-02-04 MED ORDER — CIPROFLOXACIN HCL 500 MG PO TABS: 500.0000 mg | ORAL_TABLET | Freq: Two times a day (BID) | ORAL | 0 refills | Status: AC

## 2024-02-04 NOTE — Progress Notes (Signed)
 Patient ID: [name] [name], male   DOB: Jul 03, 1946, 78 y.o.   MRN: [id number]        Chief Complaint: follow up LLQ pain       HPI:  [name] [name] is a 78 y.o. male here with c/o new osent 2 wks gradually worsening LLQ pain, dull and occasionally sharp, without radiation, and Denies worsening reflux, dysphagia, n/v, bowel change or blood.  Worse to stand up but no problem with walking,, Pt with no hx of diverticulitis but has diverticulosis on last colonoscopy apr 2023,  No hernia swelling.  Denies urinary symptoms such as dysuria, frequency, urgency, flank pain, hematuria or n/v, fever, chills.         Wt Readings from Last 3 Encounters:  02/04/24 200 lb (90.7 kg)  01/18/24 200 lb 6.4 oz (90.9 kg)  01/14/24 204 lb 9.6 oz (92.8 kg)   BP Readings from Last 3 Encounters:  02/04/24 120/68  01/18/24 132/79  01/14/24 138/70         Past Medical History:  Diagnosis Date   AICD (automatic cardioverter/defibrillator) present    ALLERGIC RHINITIS 10/31/2007   Qualifier: Diagnosis of  By: Norleen MD, Lynwood ORN    Allergy    Cancer (HCC) 12/03/2021   non-Hodgkin's B-cell lymphoma   CHOLECYSTECTOMY, HX OF 06/04/2007   Qualifier: Diagnosis of  By: Nickola CMA, [place]     Chronic HFrEF (heart failure with reduced ejection fraction) (HCC)    COLONIC POLYPS, HX OF 10/31/2007   Qualifier: Diagnosis of  By: Norleen MD, Lynwood ORN    CORONARY ARTERY BYPASS GRAFT, HX OF 06/04/2007   Qualifier: Diagnosis of  By: Nickola CMA, [place]     CORONARY ARTERY DISEASE 06/04/2007   Qualifier: Diagnosis of  By: Nickola CMA, [place]     Diverticulosis    Erectile dysfunction    Eye twitch    right eye since chilhood    History of hiatal hernia    HYPERLIPIDEMIA 06/04/2007   Qualifier: Diagnosis of  By: Nickola CMA, [place]     Hypersomnolence    HYPERTENSION 06/04/2007   Qualifier: Diagnosis of  By: Nickola CMA, [place]     Hypothyroidism    pt does not take medications and doesn't know if he has it    ISCHEMIC CARDIOMYOPATHY 06/04/2007   Qualifier: Diagnosis of  By: Nickola CMA, [place]     Ischemic cardiomyopathy    LV (left ventricular) mural thrombus 02/15/2019   Myocardial infarction [facility])    per patient , his occurred in 1998    Osteoarthritis    Peripheral vascular disease (HCC)    PLMD (periodic limb movement disorder)    Pneumonia    Vertigo    Vitamin D  deficiency    Past Surgical History:  Procedure Laterality Date   BIOPSY  11/25/2021   Procedure: BIOPSY;  Surgeon: Charlanne Groom, MD;  Location: [facility];  Service: Gastroenterology;;   CABG  1999   [facility] [place], [place].  Patient reports that a small vein was taken from under his arm and a small incesion was made.   CHOLECYSTECTOMY     COLONOSCOPY     COLONOSCOPY WITH PROPOFOL  N/A 11/25/2021   Procedure: COLONOSCOPY WITH PROPOFOL ;  Surgeon: Charlanne Groom, MD;  Location: [facility];  Service: Gastroenterology;  Laterality: N/A;   ENDOVASCULAR REPAIR OF POPLITEAL ARTERY ANEURYSM Right 05/20/2022   Procedure: LIGATION OF POPLITEAL ARTERY ANEURYSM;  Surgeon: Gretta Lonni PARAS, MD;  Location: [facility] OR;  Service: Vascular;  Laterality: Right;   ESOPHAGOGASTRODUODENOSCOPY (EGD) WITH PROPOFOL  N/A 11/25/2021   Procedure: ESOPHAGOGASTRODUODENOSCOPY (EGD) WITH PROPOFOL ;  Surgeon: Charlanne Groom, MD;  Location: [facility];  Service: Gastroenterology;  Laterality: N/A;   FEMORAL-POPLITEAL BYPASS GRAFT Right 05/20/2022   Procedure: RIGHT ABOVE KNEE POPLITEAL ARTERY TO TIBIOPERONEAL TRUNK/POSTERIOR TIBIAL ARTERY BYPASS;  Surgeon: Gretta Lonni PARAS, MD;  Location: [facility] OR;  Service: Vascular;  Laterality: Right;   ICD IMPLANT N/A 01/30/2020   Procedure: ICD IMPLANT;  Surgeon: Kelsie Agent, MD;  Location: [facility];  Service: Cardiovascular;  Laterality: N/A;   INCISION AND DRAINAGE Right 01/08/2023   Procedure: INCISION AND DRAINAGE OF RIGHT LEG  SEROMA, DRAIN PLACEMENT AND MYRIAD  MORCELLS;  Surgeon: Gretta Lonni PARAS, MD;  Location: [facility] OR;  Service: Vascular;  Laterality: Right;   IR IMAGING GUIDED PORT INSERTION  12/04/2021   KNEE ARTHROPLASTY Left 08/05/2017   Procedure: LEFT TOTAL KNEE ARTHROPLASTY WITH COMPUTER NAVIGATION;  Surgeon: Fidel Rogue, MD;  Location: [facility];  Service: Orthopedics;  Laterality: Left;  Needs RNFA   LIGATION OF CILIAC ARTERY Right 05/20/2022   Procedure: LIGATION OF RIGHT ANTERIOR TIBIAL ARTERY;  Surgeon: Gretta Lonni PARAS, MD;  Location: [facility] OR;  Service: Vascular;  Laterality: Right;   LOWER EXTREMITY ANGIOGRAPHY Right 05/14/2022   Procedure: Lower Extremity Angiography;  Surgeon: Gretta Lonni PARAS, MD;  Location: [facility] INVASIVE CV LAB;  Service: Cardiovascular;  Laterality: Right;   PENILE PROSTHESIS IMPLANT     POLYPECTOMY  11/25/2021   Procedure: POLYPECTOMY;  Surgeon: Charlanne Groom, MD;  Location: [facility];  Service: Gastroenterology;;   REPLACEMENT TOTAL KNEE  06/12/2013   spinal cyst     THORACOTOMY     left anterior; wound exploration and debridement   TONSILLECTOMY AND ADENOIDECTOMY     age 45   VEIN HARVEST Right 05/20/2022   Procedure: RIGHT GREATER SAPHENOUS VEIN HARVEST;  Surgeon: Gretta Lonni PARAS, MD;  Location: [facility];  Service: Vascular;  Laterality: Right;    reports that he quit smoking about 42 years ago. His smoking use included cigarettes. He started smoking about 59 years ago. He has a 68 pack-year smoking history. He has been exposed to tobacco smoke. He has never used smokeless tobacco. He reports current alcohol  use of about 2.0 standard drinks of alcohol  per week. He reports that he does not use drugs. family history includes COPD in his son; Colon cancer in his maternal grandfather; Heart disease in his brother; Lung cancer in his father; Pancreatic cancer in his brother, cousin, and maternal uncle; Stomach cancer in his mother. Allergies  Allergen Reactions   Ace Inhibitors Cough   Statins Other  (See Comments)    Muscle pains   Current Outpatient Medications on File Prior to Visit  Medication Sig Dispense Refill   albuterol  (VENTOLIN  HFA) 108 (90 Base) MCG/ACT inhaler Inhale 2 puffs into the lungs every 6 (six) hours as needed for wheezing or shortness of breath. 8 g 0   Ascorbic Acid  (VITA-C PO) Take 1,000 mg by mouth daily.     aspirin  EC 81 MG tablet Take 1 tablet (81 mg total) by mouth daily at 6 (six) AM. Swallow whole. 30 tablet 12   b complex vitamins capsule Take 1 capsule by mouth daily.     cholecalciferol  (VITAMIN D3) 25 MCG (1000 UNIT) tablet Take 1,000 Units by mouth daily.     ezetimibe  (ZETIA ) 10 MG tablet TAKE 1 TABLET EVERY DAY 90  tablet 2   furosemide  (LASIX ) 20 MG tablet TAKE 2 TABLETS EVERY DAY 180 tablet 3   guaiFENesin  (MUCINEX ) 600 MG 12 hr tablet Take 2 tablets (1,200 mg total) by mouth 2 (two) times daily as needed. 60 tablet 2   levofloxacin  (LEVAQUIN ) 500 MG tablet Take 1 tablet (500 mg total) by mouth daily. 10 tablet 0   losartan  (COZAAR ) 25 MG tablet TAKE 1 TABLET EVERY DAY 90 tablet 1   meclizine  (ANTIVERT ) 12.5 MG tablet Take 1 tablet (12.5 mg total) by mouth 3 (three) times daily as needed for dizziness. 40 tablet 1   meloxicam (MOBIC) 15 MG tablet Take 15 mg by mouth daily.     metoprolol  succinate (TOPROL -XL) 50 MG 24 hr tablet Take 1 tablet (50 mg total) by mouth daily. Take with or immediately following a meal. 90 tablet 3   Multiple Vitamins-Minerals (MULTIVITAMIN WITH MINERALS) tablet Take 1 tablet by mouth daily. Centrum silver     potassium chloride  SA (KLOR-CON  M) 20 MEQ tablet TAKE 1 TABLET EVERY DAY 90 tablet 3   predniSONE  (DELTASONE ) 10 MG tablet 3 tabs by mouth per day for 3 days,2tabs per day for 3 days,1tab per day for 3 days (Patient not taking: Reported on 01/18/2024) 18 tablet 0   rosuvastatin  (CRESTOR ) 5 MG tablet TAKE 1 TABLET EVERY OTHER DAY 45 tablet 2   sildenafil  (VIAGRA ) 100 MG tablet Take 0.5-1 tablets (50-100 mg total) by  mouth daily as needed. 10 tablet 11   traZODone  (DESYREL ) 50 MG tablet Take 0.5-1 tablets (25-50 mg total) by mouth at bedtime as needed. 90 tablet 1   warfarin (COUMADIN ) 5 MG tablet TAKE 1 TABLET EVERY DAY EXCEPT TAKE 1 AND 1/2 TABLETS ON MONDAYS OR AS DIRECTED 100 tablet 0   No current facility-administered medications on file prior to visit.        ROS:  All others reviewed and negative.  Objective        PE:  BP 120/68 (BP Location: Right Arm, Patient Position: Sitting, Cuff Size: Normal)   Pulse 64   Temp 98 F (36.7 C) (Oral)   Ht 5' 11 (1.803 m)   Wt 200 lb (90.7 kg)   SpO2 95%   BMI 27.89 kg/m                 Constitutional: Pt appears in NAD               HENT: Head: NCAT.                Right Ear: External ear normal.                 Left Ear: External ear normal.                Eyes: . Pupils are equal, round, and reactive to light. Conjunctivae and EOM are normal               Nose: without d/c or deformity               Neck: Neck supple. Gross normal ROM               Cardiovascular: Normal rate and regular rhythm.                 Pulmonary/Chest: Effort normal and breath sounds without rales or wheezing.                Abd:  Soft, moderate  tender to LT, no guarding or rebound,, ND, + BS, no organomegaly               Neurological: Pt is alert. At baseline orientation, motor grossly intact               Skin: Skin is warm. No rashes, no other new lesions, LE edema - no               Psychiatric: Pt behavior is normal without agitation   Micro: none  Cardiac tracings I have personally interpreted today:  none  Pertinent Radiological findings (summarize): none   Lab Results  Component Value Date   WBC 5.7 02/04/2024   HGB 13.8 02/04/2024   HCT 41.0 02/04/2024   PLT 146.0 (L) 02/04/2024   GLUCOSE 103 (H) 02/04/2024   CHOL 158 11/17/2023   TRIG 303.0 (H) 11/17/2023   HDL 39.70 11/17/2023   LDLDIRECT 102.0 10/09/2021   LDLCALC 58 11/17/2023   ALT 24  02/04/2024   AST 24 02/04/2024   NA 141 02/04/2024   K 4.3 02/04/2024   CL 101 02/04/2024   CREATININE 1.37 02/04/2024   BUN 32 (H) 02/04/2024   CO2 36 (H) 02/04/2024   TSH 2.52 11/17/2023   PSA 1.23 10/09/2021   INR 2.4 01/10/2024   HGBA1C 5.8 11/17/2023   Assessment/Plan:  [name] [name] is a 78 y.o. White or Caucasian [1] male with  has a past medical history of AICD (automatic cardioverter/defibrillator) present, ALLERGIC RHINITIS (10/31/2007), Allergy, Cancer (HCC) (12/03/2021), CHOLECYSTECTOMY, HX OF (06/04/2007), Chronic HFrEF (heart failure with reduced ejection fraction) (HCC), COLONIC POLYPS, HX OF (10/31/2007), CORONARY ARTERY BYPASS GRAFT, HX OF (06/04/2007), CORONARY ARTERY DISEASE (06/04/2007), Diverticulosis, Erectile dysfunction, Eye twitch, History of hiatal hernia, HYPERLIPIDEMIA (06/04/2007), Hypersomnolence, HYPERTENSION (06/04/2007), Hypothyroidism, ISCHEMIC CARDIOMYOPATHY (06/04/2007), Ischemic cardiomyopathy, LV (left ventricular) mural thrombus (02/15/2019), Myocardial infarction (HCC), Osteoarthritis, Peripheral vascular disease (HCC), PLMD (periodic limb movement disorder), Pneumonia, Vertigo, and Vitamin D  deficiency.  Left lower quadrant abdominal pain Recent onset, afeb but high suspciion for mild to mod acute diverticulitis; now for cipro flagyl antibx courses, lab including cbc, bmp, and urgent ct abd pelvis with CM  Hyperglycemia Lab Results  Component Value Date   HGBA1C 5.8 11/17/2023   Stable, pt to continue current medical treatment  - diet, wt control   Essential hypertension BP Readings from Last 3 Encounters:  02/04/24 120/68  01/18/24 132/79  01/14/24 138/70   Stable, pt to continue medical treatment losartan  25 mg every day, toprol  xl 50 qd   Vitamin D  deficiency Last vitamin D  Lab Results  Component Value Date   VD25OH 59.72 11/17/2023   Stable, cont oral replacement  Followup: Return if symptoms worsen or fail to  improve.  Lynwood Rush, MD 02/04/2024 8:48 PM [facility]

## 2024-02-04 NOTE — Assessment & Plan Note (Signed)
 BP Readings from Last 3 Encounters:  02/04/24 120/68  01/18/24 132/79  01/14/24 138/70   Stable, pt to continue medical treatment losartan  25 mg every day, toprol  xl 50 qd

## 2024-02-04 NOTE — Progress Notes (Signed)
 The test results show that your current treatment is OK, as the tests are stable.  Please continue the same plan.  There is no other need for change of treatment or further evaluation based on these results, at this time.  thanks

## 2024-02-04 NOTE — Assessment & Plan Note (Signed)
 Last vitamin D Lab Results  Component Value Date   VD25OH 59.72 11/17/2023   Stable, cont oral replacement

## 2024-02-04 NOTE — Telephone Encounter (Signed)
 FYI Only or Action Required?: FYI only for provider.  Patient was last seen in primary care on 12/31/2023 by Norleen Lynwood ORN, MD. Called Nurse Triage reporting Chest Pain (Left side pain). Symptoms began several weeks ago. Interventions attempted: Rest, hydration, or home remedies. Symptoms are: gradually worsening.  Triage Disposition: See Physician Within 24 Hours  Patient/caregiver understands and will follow disposition?: yes    Reason for Disposition  Age > 60 years  Answer Assessment - Initial Assessment Questions 1. LOCATION: Where does it hurt?      LLQ 2. RADIATION: Does the pain shoot anywhere else? (e.g., chest, back)     No radiation 3. ONSET: When did the pain begin? (Minutes, hours or days ago)      2 weeks ago 4. SUDDEN: Gradual or sudden onset?     sudden 5. PATTERN Does the pain come and go, or is it constant?    - If it comes and goes: How long does it last? Do you have pain now?     (Note: Comes and goes means the pain is intermittent. It goes away completely between bouts.)    - If constant: Is it getting better, staying the same, or getting worse?      (Note: Constant means the pain never goes away completely; most serious pain is constant and gets worse.)      Constant- worse- soreness 6. SEVERITY: How bad is the pain?  (e.g., Scale 1-10; mild, moderate, or severe)    - MILD (1-3): Doesn't interfere with normal activities, abdomen soft and not tender to touch.     - MODERATE (4-7): Interferes with normal activities or awakens from sleep, abdomen tender to touch.     - SEVERE (8-10): Excruciating pain, doubled over, unable to do any normal activities.       Still- no pain, move- 5-6/10 7. RECURRENT SYMPTOM: Have you ever had this type of stomach pain before? If Yes, ask: When was the last time? and What happened that time?      no 8. CAUSE: What do you think is causing the stomach pain?     not 9. RELIEVING/AGGRAVATING FACTORS: What  makes it better or worse? (e.g., antacids, bending or twisting motion, bowel movement)     Not moving/rest 10. OTHER SYMPTOMS: Do you have any other symptoms? (e.g., back pain, diarrhea, fever, urination pain, vomiting)       no  Protocols used: Abdominal Pain - Male-A-AH    Copied from CRM [id number]. Topic: Clinical - Red Word Triage >> Feb 04, 2024  9:22 AM Viola FALCON wrote: Red Word that prompted transfer to Nurse Triage: Patient having pain/soreness on left side, requested appt for week of July 7th

## 2024-02-04 NOTE — Assessment & Plan Note (Signed)
 Recent onset, afeb but high suspciion for mild to mod acute diverticulitis; now for cipro flagyl antibx courses, lab including cbc, bmp, and urgent ct abd pelvis with CM

## 2024-02-04 NOTE — Patient Instructions (Signed)
 Please take all new medication as prescribed- the two antibiotics  Please continue all other medications as before, and refills have been done if requested.  Please have the pharmacy call with any other refills you may need.  Please keep your appointments with your specialists as you may have planned  You will be contacted regarding the referral for: CT scan asap  Please go to the LAB at the blood drawing area for the tests to be done  You will be contacted by phone if any changes need to be made immediately.  Otherwise, you will receive a letter about your results with an explanation, but please check with MyChart first.

## 2024-02-04 NOTE — Assessment & Plan Note (Signed)
 Lab Results  Component Value Date   HGBA1C 5.8 11/17/2023   Stable, pt to continue current medical treatment  - diet, wt control

## 2024-02-07 ENCOUNTER — Encounter

## 2024-02-07 ENCOUNTER — Ambulatory Visit (HOSPITAL_COMMUNITY)
Admission: RE | Admit: 2024-02-07 | Discharge: 2024-02-07 | Disposition: A | Discharge status: Home / Self Care | Source: Ambulatory Visit | Attending: Internal Medicine | Admitting: Internal Medicine

## 2024-02-07 DIAGNOSIS — R1032 Left lower quadrant pain: Secondary | ICD-10-CM | POA: Diagnosis not present

## 2024-02-07 DIAGNOSIS — K5730 Diverticulosis of large intestine without perforation or abscess without bleeding: Secondary | ICD-10-CM | POA: Diagnosis not present

## 2024-02-07 MED ORDER — IOHEXOL 300 MG/ML  SOLN
100.0000 mL | Freq: Once | INTRAMUSCULAR | Status: AC | PRN
  Administered 2024-02-07: 100 mL via INTRAVENOUS

## 2024-02-08 ENCOUNTER — Encounter: Payer: Self-pay | Admitting: Orthopedic Surgery

## 2024-02-08 ENCOUNTER — Ambulatory Visit (INDEPENDENT_AMBULATORY_CARE_PROVIDER_SITE_OTHER): Admitting: Orthopedic Surgery

## 2024-02-08 DIAGNOSIS — M6701 Short Achilles tendon (acquired), right ankle: Secondary | ICD-10-CM

## 2024-02-08 DIAGNOSIS — M205X1 Other deformities of toe(s) (acquired), right foot: Secondary | ICD-10-CM

## 2024-02-08 DIAGNOSIS — M79671 Pain in right foot: Secondary | ICD-10-CM

## 2024-02-08 NOTE — Progress Notes (Signed)
 Office Visit Note   Patient: [name] [name]           Date of Birth: [birth year]           MRN: [id number] Visit Date: 02/08/2024              Requested by: Norleen Lynwood ORN, MD [address] [place],  [place] [address] PCP: Norleen Lynwood ORN, MD  Chief Complaint  Patient presents with   Right Foot - Pain      HPI: Patient is a 78 year old gentleman who presents complaining of right forefoot pain across the ball of his foot.  States this has been present for about 6 months.  Patient is status post bypass surgery for a popliteal aneurysm.  Patient states he is just been followed up with Dr. Gretta with good arterial inflow to the foot.  Assessment & Plan: Visit Diagnoses:  1. Contracture of right Achilles tendon   2. Claw toe, acquired, right   3. Pain in right foot     Plan: Patient was given instructions and demonstrated Achilles stretching to do 5 times a day a minute at a time.  Recommend a stiff soled walking sneaker such as new balance walking sneaker and sole orthotics with met pad made of cork.  Follow-Up Instructions: Return if symptoms worsen or fail to improve.   Ortho Exam  Patient is alert, oriented, no adenopathy, well-dressed, normal affect, normal respiratory effort. Examination patient has a palpable posterior tibial pulse.  Patient does have decreased intrinsic function to the right foot with early clawing of the toes most likely secondary to his earlier arterial insufficiency.  With the knee extended patient has dorsiflexion just short and neutral with Achilles contracture.  Patient has callus across the metatarsal heads.    Imaging: CT ABDOMEN PELVIS W CONTRAST Result Date: 02/07/2024 CLINICAL DATA:  Left lower quadrant pain EXAM: CT ABDOMEN AND PELVIS WITH CONTRAST TECHNIQUE: Multidetector CT imaging of the abdomen and pelvis was performed using the standard protocol following bolus administration of intravenous contrast. RADIATION DOSE REDUCTION: This exam  was performed according to the departmental dose-optimization program which includes automated exposure control, adjustment of the mA and/or kV according to patient size and/or use of iterative reconstruction technique. CONTRAST:  OMNIPAQUE  IOHEXOL  300 MG/ML  SOLN COMPARISON:  CT 10/31/2021, PET CT 11/12/2022 FINDINGS: Lower chest: Lung bases demonstrate no acute airspace disease. Minimal atelectasis or scar at the right base. Partially visualized intracardiac pacing leads. Hepatobiliary: Cholecystectomy. Stable prominence of the common bile duct likely due to postsurgical change. Pancreas: Unremarkable. No pancreatic ductal dilatation or surrounding inflammatory changes. Spleen: Normal in size without focal abnormality. Adrenals/Urinary Tract: Adrenal glands are normal. Kidneys show no hydronephrosis. Bladder is unremarkable. Stomach/Bowel: Stomach is within normal limits. Appendix appears normal. No evidence of bowel wall thickening, distention, or inflammatory changes. Diverticular disease of the colon without acute inflammation Vascular/Lymphatic: Aortic atherosclerosis. No enlarged abdominal or pelvic lymph nodes. Reproductive: Prostate is unremarkable. Penile prosthesis. Decompressed reservoir or in the anterior pelvis. Other: Negative for pelvic effusion or free air. Musculoskeletal: No acute osseous abnormality. Chronic superior endplate deformity at L3. IMPRESSION: 1. No CT evidence for acute intra-abdominal or pelvic abnormality. 2. Diverticular disease of the colon without acute inflammation. 3. Aortic atherosclerosis. Aortic Atherosclerosis (ICD10-I70.0). Electronically Signed   By: Luke Bun M.D.   On: 02/07/2024 16:04   No images are attached to the encounter.  Labs: Lab Results  Component Value Date   HGBA1C 5.8  11/17/2023   HGBA1C 5.8 (H) 06/01/2023   HGBA1C 5.8 05/18/2023   LABURIC 6.5 05/12/2022   LABURIC 7.1 03/10/2022   LABURIC 7.5 02/26/2022   REPTSTATUS 12/27/2021 FINAL  12/26/2021   CULT  12/26/2021    NO GROWTH Performed at [facility], [address] [place], [place] [address]      Lab Results  Component Value Date   ALBUMIN  4.3 02/04/2024   ALBUMIN  4.5 12/31/2023   ALBUMIN  4.4 11/17/2023    Lab Results  Component Value Date   MG 2.3 02/18/2023   MG 2.1 11/12/2022   MG 1.9 08/13/2022   Lab Results  Component Value Date   VD25OH 59.72 11/17/2023   VD25OH 58.19 11/16/2023   VD25OH 70.00 06/01/2023    No results found for: PREALBUMIN    Latest Ref Rng & Units 02/04/2024    4:38 PM 12/31/2023    2:29 PM 11/17/2023    2:32 PM  CBC EXTENDED  WBC 4.0 - 10.5 K/uL 5.7  5.0  5.0   RBC 4.22 - 5.81 Mil/uL 4.39  4.65  4.57   Hemoglobin 13.0 - 17.0 g/dL 86.1  85.2  85.0   HCT 39.0 - 52.0 % 41.0  43.8  43.7   Platelets 150.0 - 400.0 K/uL 146.0  114.0  136.0   NEUT# 1.4 - 7.7 K/uL 1.9  2.5  2.6   Lymph# 0.7 - 4.0 K/uL 2.9  1.8  1.7      There is no height or weight on file to calculate BMI.  Orders:  No orders of the defined types were placed in this encounter.  No orders of the defined types were placed in this encounter.    Procedures: No procedures performed  Clinical Data: No additional findings.  ROS:  All other systems negative, except as noted in the HPI. Review of Systems  Objective: Vital Signs: There were no vitals taken for this visit.  Specialty Comments:  No specialty comments available.  PMFS History: Patient Active Problem List   Diagnosis Date Noted   Left lower quadrant abdominal pain 02/04/2024   Paresthesia of right foot 11/20/2023   Insomnia 11/20/2023   Dental caries on pit and fissure surface penetrating into pulp 05/18/2023   Complete loss of teeth due to caries, class I 05/18/2023   Partial loss of teeth due to caries, class II 05/18/2023   Cough 05/18/2023   Laceration of right hand 04/09/2023   Encounter for well adult exam with abnormal findings 11/16/2022   Acute sinus infection  11/16/2022   Aneurysm of right popliteal artery (HCC) 05/20/2022   Popliteal artery aneurysm (HCC) 04/21/2022   Mural thrombus of left ventricle 02/26/2022   PNA (pneumonia) 01/19/2022   Acute respiratory failure with hypoxia (HCC) 01/19/2022   Finger laceration 12/24/2021   High grade B-cell lymphoma (HCC) 12/03/2021   Abnormal CT scan, stomach    Actinic keratosis 10/09/2021   Aftercare following joint replacement 10/09/2021   Benign neoplasm of colon 10/09/2021   Deposits (accretions) on teeth 10/09/2021   Dizziness and giddiness 10/09/2021   Hypothyroidism 10/09/2021   Low back pain, unspecified 10/09/2021   Cough with hemoptysis 10/09/2021   Dizziness 10/09/2021   Right sided sciatica 10/09/2021   Statin intolerance 10/09/2021   Loss of weight 09/23/2021   Family history of pancreatic cancer 09/23/2021   Chronic anticoagulation 09/23/2021   Right leg paresthesias 10/02/2020   Erectile dysfunction 03/22/2019   Hyperglycemia 03/07/2018   Primary localized osteoarthritis of  left knee 08/06/2017   Degenerative arthritis of left knee 08/05/2017   Osteoarthritis of left knee 08/05/2017   Right flank pain 09/29/2016   Dyspnea and respiratory abnormality 09/10/2016   Vitamin D  deficiency 05/11/2014   PLMD (periodic limb movement disorder) 02/02/2013   Encounter for therapeutic drug monitoring 12/06/2011   Joint pain 10/14/2011   Encounter for long-term (current) use of other medications 12/25/2010   DYSPNEA 07/30/2009   Allergic rhinitis 10/31/2007   History of colonic polyps 10/31/2007   Hyperlipidemia 06/04/2007   Essential hypertension 06/04/2007   Coronary atherosclerosis 06/04/2007   Ischemic cardiomyopathy 06/04/2007   PENILE PROSTHESIS 06/04/2007   CHOLECYSTECTOMY, HX OF 06/04/2007   CORONARY ARTERY BYPASS GRAFT, HX OF 06/04/2007   Past Medical History:  Diagnosis Date   AICD (automatic cardioverter/defibrillator) present    ALLERGIC RHINITIS 10/31/2007    Qualifier: Diagnosis of  By: Norleen MD, Lynwood ORN    Allergy    Cancer (HCC) 12/03/2021   non-Hodgkin's B-cell lymphoma   CHOLECYSTECTOMY, HX OF 06/04/2007   Qualifier: Diagnosis of  By: Nickola CMA, [place]     Chronic HFrEF (heart failure with reduced ejection fraction) (HCC)    COLONIC POLYPS, HX OF 10/31/2007   Qualifier: Diagnosis of  By: Norleen MD, Lynwood ORN    CORONARY ARTERY BYPASS GRAFT, HX OF 06/04/2007   Qualifier: Diagnosis of  By: Nickola CMA, [place]     CORONARY ARTERY DISEASE 06/04/2007   Qualifier: Diagnosis of  By: Nickola CMA, [place]     Diverticulosis    Erectile dysfunction    Eye twitch    right eye since chilhood    History of hiatal hernia    HYPERLIPIDEMIA 06/04/2007   Qualifier: Diagnosis of  By: Nickola CMA, [place]     Hypersomnolence    HYPERTENSION 06/04/2007   Qualifier: Diagnosis of  By: Nickola CMA, [place]     Hypothyroidism    pt does not take medications and doesn't know if he has it   ISCHEMIC CARDIOMYOPATHY 06/04/2007   Qualifier: Diagnosis of  By: Nickola CMA, [place]     Ischemic cardiomyopathy    LV (left ventricular) mural thrombus 02/15/2019   Myocardial infarction [facility])    per patient , his occurred in 1998    Osteoarthritis    Peripheral vascular disease (HCC)    PLMD (periodic limb movement disorder)    Pneumonia    Vertigo    Vitamin D  deficiency     Family History  Problem Relation Age of Onset   Stomach cancer Mother        smokes   Lung cancer Father        chewed tobacco   Heart disease Brother        first MI at 65yo, now 68 for transplant list/ ICM   Pancreatic cancer Brother    Colon cancer Maternal Grandfather    COPD Son        was a smoker   Pancreatic cancer Maternal Uncle    Pancreatic cancer Cousin        mat side x 4   Esophageal cancer Neg Hx    Prostate cancer Neg Hx    Rectal cancer Neg Hx     Past Surgical History:  Procedure Laterality Date   BIOPSY  11/25/2021   Procedure: BIOPSY;  Surgeon: Charlanne Groom, MD;  Location: [facility];  Service: Gastroenterology;;   CABG  1999   [facility] [place], [place].  Patient reports that a small vein was taken from under his arm and a small incesion was made.   CHOLECYSTECTOMY     COLONOSCOPY     COLONOSCOPY WITH PROPOFOL  N/A 11/25/2021   Procedure: COLONOSCOPY WITH PROPOFOL ;  Surgeon: Charlanne Groom, MD;  Location: [facility];  Service: Gastroenterology;  Laterality: N/A;   ENDOVASCULAR REPAIR OF POPLITEAL ARTERY ANEURYSM Right 05/20/2022   Procedure: LIGATION OF POPLITEAL ARTERY ANEURYSM;  Surgeon: Gretta Lonni PARAS, MD;  Location: [facility] OR;  Service: Vascular;  Laterality: Right;   ESOPHAGOGASTRODUODENOSCOPY (EGD) WITH PROPOFOL  N/A 11/25/2021   Procedure: ESOPHAGOGASTRODUODENOSCOPY (EGD) WITH PROPOFOL ;  Surgeon: Charlanne Groom, MD;  Location: [facility];  Service: Gastroenterology;  Laterality: N/A;   FEMORAL-POPLITEAL BYPASS GRAFT Right 05/20/2022   Procedure: RIGHT ABOVE KNEE POPLITEAL ARTERY TO TIBIOPERONEAL TRUNK/POSTERIOR TIBIAL ARTERY BYPASS;  Surgeon: Gretta Lonni PARAS, MD;  Location: [facility] OR;  Service: Vascular;  Laterality: Right;   ICD IMPLANT N/A 01/30/2020   Procedure: ICD IMPLANT;  Surgeon: Kelsie Agent, MD;  Location: [facility] INVASIVE CV LAB;  Service: Cardiovascular;  Laterality: N/A;   INCISION AND DRAINAGE Right 01/08/2023   Procedure: INCISION AND DRAINAGE OF RIGHT LEG  SEROMA, DRAIN PLACEMENT AND MYRIAD MORCELLS;  Surgeon: Gretta Lonni PARAS, MD;  Location: [facility] OR;  Service: Vascular;  Laterality: Right;   IR IMAGING GUIDED PORT INSERTION  12/04/2021   KNEE ARTHROPLASTY Left 08/05/2017   Procedure: LEFT TOTAL KNEE ARTHROPLASTY WITH COMPUTER NAVIGATION;  Surgeon: Fidel Rogue, MD;  Location: [facility]S;  Service: Orthopedics;  Laterality: Left;  Needs RNFA   LIGATION OF CILIAC ARTERY Right 05/20/2022   Procedure: LIGATION OF RIGHT ANTERIOR TIBIAL ARTERY;  Surgeon: Gretta Lonni PARAS, MD;  Location:  [facility] OR;  Service: Vascular;  Laterality: Right;   LOWER EXTREMITY ANGIOGRAPHY Right 05/14/2022   Procedure: Lower Extremity Angiography;  Surgeon: Gretta Lonni PARAS, MD;  Location: [facility] INVASIVE CV LAB;  Service: Cardiovascular;  Laterality: Right;   PENILE PROSTHESIS IMPLANT     POLYPECTOMY  11/25/2021   Procedure: POLYPECTOMY;  Surgeon: Charlanne Groom, MD;  Location: [facility];  Service: Gastroenterology;;   REPLACEMENT TOTAL KNEE  06/12/2013   spinal cyst     THORACOTOMY     left anterior; wound exploration and debridement   TONSILLECTOMY AND ADENOIDECTOMY     age 75   VEIN HARVEST Right 05/20/2022   Procedure: RIGHT GREATER SAPHENOUS VEIN HARVEST;  Surgeon: Gretta Lonni PARAS, MD;  Location: [facility] OR;  Service: Vascular;  Laterality: Right;   Social History   Occupational History   Occupation: truck Air traffic controller: [employer]  Tobacco Use   Smoking status: Former    Current packs/day: 0.00    Average packs/day: 4.0 packs/day for 17.0 years (68.0 ttl pk-yrs)    Types: Cigarettes    Start date: 05/07/1964    Quit date: 05/07/1981    Years since quitting: 42.7    Passive exposure: Past   Smokeless tobacco: Never   Tobacco comments:    Pt states that he would let most of them burn pt states that he used anywhere between 4-5PPD  Vaping Use   Vaping status: Never Used  Substance and Sexual Activity   Alcohol  use: Yes    Alcohol /week: 2.0 standard drinks of alcohol     Types: 2 Cans of beer per week    Comment: occ   Drug use: No   Sexual activity: Not on file

## 2024-02-09 ENCOUNTER — Ambulatory Visit: Payer: Self-pay | Admitting: Cardiovascular Disease

## 2024-02-09 ENCOUNTER — Ambulatory Visit: Attending: Cardiology | Admitting: *Deleted

## 2024-02-09 ENCOUNTER — Encounter

## 2024-02-09 DIAGNOSIS — I513 Intracardiac thrombosis, not elsewhere classified: Secondary | ICD-10-CM | POA: Diagnosis not present

## 2024-02-09 DIAGNOSIS — Z5181 Encounter for therapeutic drug level monitoring: Secondary | ICD-10-CM

## 2024-02-09 LAB — POCT INR: INR: 1.4 — AB (ref 2.0–3.0)

## 2024-02-09 NOTE — Patient Instructions (Signed)
 Take warfarin 1 1/2 tablets tonight and tomorrow night then resume 1 tablet daily except 1/2 tablet on Tuesdays and Fridays Continue greens Recheck in 1 wk

## 2024-02-09 NOTE — Progress Notes (Signed)
Please see anticoagulation encounter.

## 2024-02-12 ENCOUNTER — Other Ambulatory Visit: Payer: Self-pay | Admitting: Cardiovascular Disease

## 2024-02-15 ENCOUNTER — Ambulatory Visit: Attending: Cardiology | Admitting: *Deleted

## 2024-02-15 DIAGNOSIS — Z5181 Encounter for therapeutic drug level monitoring: Secondary | ICD-10-CM | POA: Diagnosis not present

## 2024-02-15 DIAGNOSIS — I513 Intracardiac thrombosis, not elsewhere classified: Secondary | ICD-10-CM | POA: Diagnosis not present

## 2024-02-15 LAB — POCT INR: INR: 2.1 (ref 2.0–3.0)

## 2024-02-15 NOTE — Patient Instructions (Signed)
 Continue warfarin 1 tablet daily except 1/2 tablet on Tuesdays and Fridays Continue greens Recheck in 3 wk

## 2024-02-15 NOTE — Progress Notes (Signed)
Please see anticoagulation encounter.

## 2024-02-18 ENCOUNTER — Encounter: Payer: Self-pay | Admitting: Cardiology

## 2024-02-22 NOTE — Telephone Encounter (Signed)
Ok this referral is done 

## 2024-02-24 ENCOUNTER — Telehealth: Payer: Self-pay | Admitting: Cardiology

## 2024-02-24 NOTE — Telephone Encounter (Signed)
 Attempted to call pt. No answer, left message for him to return the call.

## 2024-02-24 NOTE — Telephone Encounter (Signed)
 Pt returning call from this morning regarding MyChart message from 7/11. Please advise

## 2024-02-24 NOTE — Telephone Encounter (Signed)
 See patient msg note dated 02/18/24--pt reported bilateral leg weakness and sob in that conversation.   Spoke to pt, now reporting ongoing leg weakness and SOB with any activity, none at rest. Denies LE swelling, no chest pain/tightness, no other symptoms and does not have any vital readings to report but says they have continued to be good (normal vitals in 7/11 note).   Pt states he has had recent pneumonia, PCP treated left lung pneumonia with antibiotics. He finished regimen and no change to SOB. He was also worked up with LE ultrasound after reporting left side pain to PCP. Results in chart. Pt has hx of lymphoma, he follows up in late August. He called oncologist and they asked him to f/u with PCP.  He is now reaching out to cardiologist to see if this could be cardiac related. Pt did have recent echo (in chart). Please review his recent testing in chart and advise.

## 2024-02-28 ENCOUNTER — Inpatient Hospital Stay: Payer: Medicare HMO | Attending: Hematology

## 2024-02-28 DIAGNOSIS — Z9221 Personal history of antineoplastic chemotherapy: Secondary | ICD-10-CM | POA: Diagnosis not present

## 2024-02-28 DIAGNOSIS — Z800 Family history of malignant neoplasm of digestive organs: Secondary | ICD-10-CM | POA: Diagnosis not present

## 2024-02-28 DIAGNOSIS — N182 Chronic kidney disease, stage 2 (mild): Secondary | ICD-10-CM | POA: Diagnosis not present

## 2024-02-28 DIAGNOSIS — Z8572 Personal history of non-Hodgkin lymphomas: Secondary | ICD-10-CM | POA: Diagnosis not present

## 2024-02-28 DIAGNOSIS — I255 Ischemic cardiomyopathy: Secondary | ICD-10-CM | POA: Diagnosis not present

## 2024-02-28 DIAGNOSIS — R7989 Other specified abnormal findings of blood chemistry: Secondary | ICD-10-CM | POA: Diagnosis not present

## 2024-02-28 DIAGNOSIS — D696 Thrombocytopenia, unspecified: Secondary | ICD-10-CM | POA: Insufficient documentation

## 2024-02-28 DIAGNOSIS — I509 Heart failure, unspecified: Secondary | ICD-10-CM | POA: Insufficient documentation

## 2024-02-28 DIAGNOSIS — C8510 Unspecified B-cell lymphoma, unspecified site: Secondary | ICD-10-CM

## 2024-02-28 DIAGNOSIS — Z7901 Long term (current) use of anticoagulants: Secondary | ICD-10-CM | POA: Diagnosis not present

## 2024-02-28 LAB — COMPREHENSIVE METABOLIC PANEL WITH GFR
ALT: 29 U/L (ref 0–44)
AST: 33 U/L (ref 15–41)
Albumin: 3.6 g/dL (ref 3.5–5.0)
Alkaline Phosphatase: 67 U/L (ref 38–126)
Anion gap: 8 (ref 5–15)
BUN: 28 mg/dL — ABNORMAL HIGH (ref 8–23)
CO2: 28 mmol/L (ref 22–32)
Calcium: 8.7 mg/dL — ABNORMAL LOW (ref 8.9–10.3)
Chloride: 100 mmol/L (ref 98–111)
Creatinine, Ser: 1.26 mg/dL — ABNORMAL HIGH (ref 0.61–1.24)
GFR, Estimated: 58 mL/min — ABNORMAL LOW (ref >60)
Glucose, Bld: 110 mg/dL — ABNORMAL HIGH (ref 70–99)
Potassium: 4.1 mmol/L (ref 3.5–5.1)
Sodium: 136 mmol/L (ref 135–145)
Total Bilirubin: 1.4 mg/dL — ABNORMAL HIGH (ref 0.0–1.2)
Total Protein: 6.6 g/dL (ref 6.5–8.1)

## 2024-02-28 LAB — CBC WITH DIFFERENTIAL/PLATELET
Abs Immature Granulocytes: 0.02 K/uL (ref 0.00–0.07)
Basophils Absolute: 0 K/uL (ref 0.0–0.1)
Basophils Relative: 1 %
Eosinophils Absolute: 0.1 K/uL (ref 0.0–0.5)
Eosinophils Relative: 1 %
HCT: 36 % — ABNORMAL LOW (ref 39.0–52.0)
Hemoglobin: 12 g/dL — ABNORMAL LOW (ref 13.0–17.0)
Immature Granulocytes: 0 %
Lymphocytes Relative: 64 %
Lymphs Abs: 4 K/uL (ref 0.7–4.0)
MCH: 32.3 pg (ref 26.0–34.0)
MCHC: 33.3 g/dL (ref 30.0–36.0)
MCV: 97 fL (ref 80.0–100.0)
Monocytes Absolute: 0.7 K/uL (ref 0.1–1.0)
Monocytes Relative: 12 %
Neutro Abs: 1.4 K/uL — ABNORMAL LOW (ref 1.7–7.7)
Neutrophils Relative %: 22 %
Platelets: 108 K/uL — ABNORMAL LOW (ref 150–400)
RBC: 3.71 MIL/uL — ABNORMAL LOW (ref 4.22–5.81)
RDW: 14.5 % (ref 11.5–15.5)
WBC: 6.3 K/uL (ref 4.0–10.5)
nRBC: 0 % (ref 0.0–0.2)

## 2024-02-28 LAB — LACTATE DEHYDROGENASE: LDH: 156 U/L (ref 98–192)

## 2024-02-28 MED ORDER — SODIUM CHLORIDE 0.9% FLUSH
10.0000 mL | INTRAVENOUS | Status: AC
  Administered 2024-02-28: 10 mL

## 2024-02-28 MED ORDER — HEPARIN SOD (PORK) LOCK FLUSH 100 UNIT/ML IV SOLN
500.0000 [IU] | Freq: Once | INTRAVENOUS | Status: AC
  Administered 2024-02-28: 500 [IU] via INTRAVENOUS

## 2024-02-28 NOTE — Telephone Encounter (Signed)
Spoke with pt, follow up scheduled.

## 2024-02-28 NOTE — Progress Notes (Signed)
 [name] [name] presented for Portacath access and flush with labs.  Portacath located right chest wall accessed with  H 20 needle.  Good blood return present. Portacath flushed with 10ml NS and 500U/80ml Heparin  and needle removed intact. No bruising or swelling noted at the site.  Procedure tolerated well and without incident.     Discharged from clinic ambulatory in stable condition. Alert and oriented x 3. F/U with [facility] as scheduled.

## 2024-02-28 NOTE — Patient Instructions (Signed)
 [facility] [place] - A DEPT OF [facility]  Discharge Instructions: Thank you for choosing [facility] to provide your oncology and hematology care.  If you have a lab appointment with the Cancer Center - please note that after April 8th, 2024, all labs will be drawn in the cancer center.  You do not have to check in or register with the main entrance as you have in the past but will complete your check-in in the cancer center.  Wear comfortable clothing and clothing appropriate for easy access to any Portacath or PICC line.   We strive to give you quality time with your provider. You may need to reschedule your appointment if you arrive late (15 or more minutes).  Arriving late affects you and other patients whose appointments are after yours.  Also, if you miss three or more appointments without notifying the office, you may be dismissed from the clinic at the provider's discretion.      For prescription refill requests, have your pharmacy contact our office and allow 72 hours for refills to be completed.    Today you received port flush with labs     BELOW ARE SYMPTOMS THAT SHOULD BE REPORTED IMMEDIATELY: *FEVER GREATER THAN 100.4 F (38 C) OR HIGHER *CHILLS OR SWEATING *NAUSEA AND VOMITING THAT IS NOT CONTROLLED WITH YOUR NAUSEA MEDICATION *UNUSUAL SHORTNESS OF BREATH *UNUSUAL BRUISING OR BLEEDING *URINARY PROBLEMS (pain or burning when urinating, or frequent urination) *BOWEL PROBLEMS (unusual diarrhea, constipation, pain near the anus) TENDERNESS IN MOUTH AND THROAT WITH OR WITHOUT PRESENCE OF ULCERS (sore throat, sores in mouth, or a toothache) UNUSUAL RASH, SWELLING OR PAIN  UNUSUAL VAGINAL DISCHARGE OR ITCHING   Items with * indicate a potential emergency and should be followed up as soon as possible or go to the Emergency Department if any problems should occur.  Please show the CHEMOTHERAPY ALERT CARD or IMMUNOTHERAPY ALERT CARD at  check-in to the Emergency Department and triage nurse.  Should you have questions after your visit or need to cancel or reschedule your appointment, please contact [facility] [place] - A DEPT OF [facility] [phone]  and follow the prompts.  Office hours are 8:00 a.m. to 4:30 p.m. Monday - Friday. Please note that voicemails left after 4:00 p.m. may not be returned until the following business day.  We are closed weekends and major holidays. You have access to a nurse at all times for urgent questions. Please call the main number to the clinic [phone] and follow the prompts.  For any non-urgent questions, you may also contact your provider using MyChart. We now offer e-Visits for anyone 84 and older to request care online for non-urgent symptoms. For details visit [email or web address].   Also download the MyChart app! Go to the app store, search "MyChart", open the app, select , and log in with your MyChart username and password.

## 2024-02-29 ENCOUNTER — Telehealth: Payer: Self-pay

## 2024-02-29 NOTE — Telephone Encounter (Signed)
 Copied from CRM [id number]. Topic: Referral - Status >> Feb 29, 2024 10:42 AM Aleatha C wrote: Reason for CRM: Patient called about colonoscopy referral is being set up and would like it to be in [place] if possibole

## 2024-03-03 NOTE — Telephone Encounter (Signed)
 Called and informed patient of response from PCP. Patient stated they would stick with the [facility] location

## 2024-03-03 NOTE — Telephone Encounter (Signed)
 I am not aware of any [place] related GI that are practicing in [place].  They likely have GI there though, and I can refer to somewhere there is he wants this.   This of course would likely be outside the [facility] and getting records can be difficult, but can still be done if he likes.

## 2024-03-06 ENCOUNTER — Inpatient Hospital Stay (HOSPITAL_BASED_OUTPATIENT_CLINIC_OR_DEPARTMENT_OTHER): Payer: Medicare HMO | Admitting: Hematology

## 2024-03-06 VITALS — BP 95/65 | HR 58 | Temp 97.6°F | Resp 18 | Wt 195.1 lb

## 2024-03-06 DIAGNOSIS — Z800 Family history of malignant neoplasm of digestive organs: Secondary | ICD-10-CM | POA: Diagnosis not present

## 2024-03-06 DIAGNOSIS — D696 Thrombocytopenia, unspecified: Secondary | ICD-10-CM | POA: Diagnosis not present

## 2024-03-06 DIAGNOSIS — R1032 Left lower quadrant pain: Secondary | ICD-10-CM

## 2024-03-06 DIAGNOSIS — Z87891 Personal history of nicotine dependence: Secondary | ICD-10-CM | POA: Diagnosis not present

## 2024-03-06 DIAGNOSIS — R7989 Other specified abnormal findings of blood chemistry: Secondary | ICD-10-CM | POA: Diagnosis not present

## 2024-03-06 DIAGNOSIS — R748 Abnormal levels of other serum enzymes: Secondary | ICD-10-CM | POA: Diagnosis not present

## 2024-03-06 DIAGNOSIS — I255 Ischemic cardiomyopathy: Secondary | ICD-10-CM | POA: Diagnosis not present

## 2024-03-06 DIAGNOSIS — Z7901 Long term (current) use of anticoagulants: Secondary | ICD-10-CM | POA: Diagnosis not present

## 2024-03-06 DIAGNOSIS — Z9221 Personal history of antineoplastic chemotherapy: Secondary | ICD-10-CM | POA: Diagnosis not present

## 2024-03-06 DIAGNOSIS — D508 Other iron deficiency anemias: Secondary | ICD-10-CM | POA: Diagnosis not present

## 2024-03-06 DIAGNOSIS — Z801 Family history of malignant neoplasm of trachea, bronchus and lung: Secondary | ICD-10-CM

## 2024-03-06 DIAGNOSIS — N182 Chronic kidney disease, stage 2 (mild): Secondary | ICD-10-CM | POA: Diagnosis not present

## 2024-03-06 DIAGNOSIS — I509 Heart failure, unspecified: Secondary | ICD-10-CM | POA: Diagnosis not present

## 2024-03-06 DIAGNOSIS — C8510 Unspecified B-cell lymphoma, unspecified site: Secondary | ICD-10-CM | POA: Diagnosis not present

## 2024-03-06 DIAGNOSIS — Z8572 Personal history of non-Hodgkin lymphomas: Secondary | ICD-10-CM | POA: Diagnosis not present

## 2024-03-06 NOTE — Patient Instructions (Addendum)

## 2024-03-06 NOTE — Progress Notes (Signed)
 [facility] [address], [place] [address]    Clinic Day:  03/06/2024  Referring physician: Norleen Lynwood ORN, MD  Patient Care Team: Norleen Lynwood ORN, MD as PCP - General Pietro Redell RAMAN, MD as PCP - Cardiology (Cardiology) Mealor, Eulas BRAVO, MD as PCP - Electrophysiology (Cardiology) Rogers Hai, MD as Medical Oncologist (Medical Oncology) Celestia Joesph SQUIBB, RN as Oncology Nurse Navigator (Medical Oncology)   ASSESSMENT & PLAN:   Assessment: 1. Stage IVb high-grade B-cell lymphoma: - He had unintentional weight loss of 20 pounds since September 2022.  Weight has been stable for the last 2 months.  He had multiple family members with pancreatic cancer. - CT AP with contrast (10/31/2021): Abnormal nodular thickening of the right anterior pararenal fascia, suspicious for atypical malignancy.  No evidence of pancreatic malignancy.  Irregular wall thickening of the proximal stomach.  No ascites or peritoneal nodularity. - Biopsy of nodular thickening (11/17/2021): High-grade B-cell lymphoma with Burkitt-like features.  Ki-67 100%. 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  Neoplastic lymphocytes positive for CD20, CD10, Bcl-2, BCL6 and mum 1.  Ki-67 more than 95%.  H pylori IHC negative. - High-grade lymphoma panel (11/17/2021): Negative for BCL6, MYC, BCL2 rearrangements, negative for MYC amplification, t(8:14) not detected. - High risk features for CNS disease: Age more than 60, stage III/IV, extranodal involvement more than 1 site.  Based on 3 points, intermediate risk.  No clinical signs of CNS involvement. - IPI: High intermediate with 3 points (age more than 60, stage III/IV, extranodal involvement more than 1 site) - 6 cycles of R-CEOP from 12/15/2021 through 03/31/2022 - PET scan (04/30/2022): No evidence of lymphoma (Deauville 1 response).   2. Social/family history: - He  has AICD placed in June 2021 secondary to CHF. - He is a retired Cytogeneticist.  He worked at [employer] in [place].  He had exposure to some chemicals.  He quit smoking more than 40 years ago.  He drinks mostly beer and occasionally whiskey at bedtime.   3. Ischemic cardiomyopathy/CHF/apical thrombus: - He is on Coumadin .  Will check PT/INR today. - Last seen by Dr. Pietro on 11/06/2021.   -Last echo on 12/27/2020 with EF 30-35%.  Limited visualization of endocardium.  LV has moderately decreased function.    Plan: Stage IVb high-grade B-cell lymphoma: - He does not have any fevers or weight loss.  He has occasional night sweats once or twice a week which are stable for more than a year. - Physical exam: No palpable adenopathy or splenomegaly. - Labs from 02/28/2024: Normal LFTs.  LDH is normal.  CBC grossly normal with stable mild thrombocytopenia.  Hemoglobin is slightly lower at 12.  Will repeat in 6 months along with ferritin and iron panel.  RTC 6 months for follow-up.   2.  Elevated creatinine: - He has mild CKD.  Creatinine elevated at 1.26 and stable.  3.  Left lower quadrant pain: - This pain started in mid May.  He was evaluated by Dr. Norleen.  I have reviewed CTAP from 02/07/2024: No evidence of lymphadenopathy or lymphoma recurrence.  Diverticular disease in the colon without acute inflammation. - He is scheduled to see GI for colonoscopy in the future.    Orders Placed This Encounter  Procedures   CBC with Differential    Standing Status:   Future    Expected Date:   09/11/2024    Expiration  Date:   12/10/2024   Comprehensive metabolic panel    Standing Status:   Future    Expected Date:   09/11/2024    Expiration Date:   12/10/2024   Lactate dehydrogenase    Standing Status:   Future    Expected Date:   09/11/2024    Expiration Date:   12/10/2024   Iron and TIBC ([facility] ONLY)    Standing Status:   Future    Expected Date:   09/11/2024    Expiration Date:   12/10/2024    Ferritin    Standing Status:   Future    Expected Date:   09/11/2024    Expiration Date:   12/10/2024      LILLETTE Hummingbird R Teague,acting as a scribe for Alean Stands, MD.,have documented all relevant documentation on the behalf of Alean Stands, MD,as directed by  Alean Stands, MD while in the presence of Alean Stands, MD.  I, Alean Stands MD, have reviewed the above documentation for accuracy and completeness, and I agree with the above.    Alean Stands, MD   7/28/20254:26 PM  CHIEF COMPLAINT:   Diagnosis: stage IVb high-grade B-cell lymphoma    Cancer Staging  High grade B-cell lymphoma (HCC) Staging form: Hodgkin and Non-Hodgkin Lymphoma, AJCC 8th Edition - Clinical stage from 12/03/2021: Stage IV (Diffuse large B-cell lymphoma) - Unsigned    Prior Therapy: R-CEOP q21d x 6 Cycles, 12/15/21 - 03/31/22  Current Therapy:  surveillance    HISTORY OF PRESENT ILLNESS:   Oncology History  High grade B-cell lymphoma (HCC)  12/03/2021 Initial Diagnosis   High grade B-cell lymphoma (HCC)   12/15/2021 - 04/03/2022 Chemotherapy   Patient is on Treatment Plan : NON-HODGKIN'S LYMPHOMA R-CEOP q21d x 3 Cycles        INTERVAL HISTORY:   Johnny is a 78 y.o. male presenting to clinic today for follow up of stage IVb high-grade B-cell lymphoma. He was last seen by me on 09/06/2023.  Today, he states that he is doing well overall. His appetite level is at 25%. His energy level is at 25%.  PAST MEDICAL HISTORY:   Past Medical History: Past Medical History:  Diagnosis Date   AICD (automatic cardioverter/defibrillator) present    ALLERGIC RHINITIS 10/31/2007   Qualifier: Diagnosis of  By: Norleen MD, Lynwood ORN    Allergy    Cancer (HCC) 12/03/2021   non-Hodgkin's B-cell lymphoma   CHOLECYSTECTOMY, HX OF 06/04/2007   Qualifier: Diagnosis of  By: Nickola CMA, [place]     Chronic HFrEF (heart failure with reduced ejection fraction) (HCC)    COLONIC POLYPS,  HX OF 10/31/2007   Qualifier: Diagnosis of  By: Norleen MD, Lynwood ORN    CORONARY ARTERY BYPASS GRAFT, HX OF 06/04/2007   Qualifier: Diagnosis of  By: Nickola CMA, [place]     CORONARY ARTERY DISEASE 06/04/2007   Qualifier: Diagnosis of  By: Nickola CMA, [place]     Diverticulosis    Erectile dysfunction    Eye twitch    right eye since chilhood    History of hiatal hernia    HYPERLIPIDEMIA 06/04/2007   Qualifier: Diagnosis of  By: Nickola CMA, [place]     Hypersomnolence    HYPERTENSION 06/04/2007   Qualifier: Diagnosis of  By: Nickola CMA, [place]     Hypothyroidism    pt does not take medications and doesn't know if he has it   ISCHEMIC CARDIOMYOPATHY 06/04/2007   Qualifier: Diagnosis of  By: Nickola CMA,  [place]     Ischemic cardiomyopathy    LV (left ventricular) mural thrombus 02/15/2019   Myocardial infarction [facility])    per patient , his occurred in 1998    Osteoarthritis    Peripheral vascular disease (HCC)    PLMD (periodic limb movement disorder)    Pneumonia    Vertigo    Vitamin D  deficiency     Surgical History: Past Surgical History:  Procedure Laterality Date   BIOPSY  11/25/2021   Procedure: BIOPSY;  Surgeon: Charlanne Groom, MD;  Location: [facility];  Service: Gastroenterology;;   CABG  1999   was done at the [facility] in [place], [place].  Patient reports that a small vein was taken from under his arm and a small incesion was made.   CHOLECYSTECTOMY     COLONOSCOPY     COLONOSCOPY WITH PROPOFOL  N/A 11/25/2021   Procedure: COLONOSCOPY WITH PROPOFOL ;  Surgeon: Charlanne Groom, MD;  Location: [facility];  Service: Gastroenterology;  Laterality: N/A;   ENDOVASCULAR REPAIR OF POPLITEAL ARTERY ANEURYSM Right 05/20/2022   Procedure: LIGATION OF POPLITEAL ARTERY ANEURYSM;  Surgeon: Gretta Lonni PARAS, MD;  Location: [facility] OR;  Service: Vascular;  Laterality: Right;   ESOPHAGOGASTRODUODENOSCOPY (EGD) WITH PROPOFOL  N/A 11/25/2021   Procedure:  ESOPHAGOGASTRODUODENOSCOPY (EGD) WITH PROPOFOL ;  Surgeon: Charlanne Groom, MD;  Location: [facility];  Service: Gastroenterology;  Laterality: N/A;   FEMORAL-POPLITEAL BYPASS GRAFT Right 05/20/2022   Procedure: RIGHT ABOVE KNEE POPLITEAL ARTERY TO TIBIOPERONEAL TRUNK/POSTERIOR TIBIAL ARTERY BYPASS;  Surgeon: Gretta Lonni PARAS, MD;  Location: [facility] OR;  Service: Vascular;  Laterality: Right;   ICD IMPLANT N/A 01/30/2020   Procedure: ICD IMPLANT;  Surgeon: Kelsie Agent, MD;  Location: [facility] INVASIVE CV LAB;  Service: Cardiovascular;  Laterality: N/A;   INCISION AND DRAINAGE Right 01/08/2023   Procedure: INCISION AND DRAINAGE OF RIGHT LEG  SEROMA, DRAIN PLACEMENT AND MYRIAD MORCELLS;  Surgeon: Gretta Lonni PARAS, MD;  Location: [facility] OR;  Service: Vascular;  Laterality: Right;   IR IMAGING GUIDED PORT INSERTION  12/04/2021   KNEE ARTHROPLASTY Left 08/05/2017   Procedure: LEFT TOTAL KNEE ARTHROPLASTY WITH COMPUTER NAVIGATION;  Surgeon: Fidel Rogue, MD;  Location: [facility];  Service: Orthopedics;  Laterality: Left;  Needs RNFA   LIGATION OF CILIAC ARTERY Right 05/20/2022   Procedure: LIGATION OF RIGHT ANTERIOR TIBIAL ARTERY;  Surgeon: Gretta Lonni PARAS, MD;  Location: [facility] OR;  Service: Vascular;  Laterality: Right;   LOWER EXTREMITY ANGIOGRAPHY Right 05/14/2022   Procedure: Lower Extremity Angiography;  Surgeon: Gretta Lonni PARAS, MD;  Location: [facility] INVASIVE CV LAB;  Service: Cardiovascular;  Laterality: Right;   PENILE PROSTHESIS IMPLANT     POLYPECTOMY  11/25/2021   Procedure: POLYPECTOMY;  Surgeon: Charlanne Groom, MD;  Location: [facility];  Service: Gastroenterology;;   REPLACEMENT TOTAL KNEE  06/12/2013   spinal cyst     THORACOTOMY     left anterior; wound exploration and debridement   TONSILLECTOMY AND ADENOIDECTOMY     age 7   VEIN HARVEST Right 05/20/2022   Procedure: RIGHT GREATER SAPHENOUS VEIN HARVEST;  Surgeon: Gretta Lonni PARAS, MD;  Location: [facility] OR;  Service: Vascular;   Laterality: Right;    Social History: Social History   Socioeconomic History   Marital status: Married    Spouse name: Dickey   Number of children: 3   Years of education: Not on file   Highest education level: Not on file  Occupational History   Occupation: truck Air traffic controller:  [employer]  Tobacco Use   Smoking status: Former    Current packs/day: 0.00    Average packs/day: 4.0 packs/day for 17.0 years (68.0 ttl pk-yrs)    Types: Cigarettes    Start date: 05/07/1964    Quit date: 05/07/1981    Years since quitting: 42.8    Passive exposure: Past   Smokeless tobacco: Never   Tobacco comments:    Pt states that he would let most of them burn pt states that he used anywhere between 4-5PPD  Vaping Use   Vaping status: Never Used  Substance and Sexual Activity   Alcohol  use: Yes    Alcohol /week: 2.0 standard drinks of alcohol     Types: 2 Cans of beer per week    Comment: occ   Drug use: No   Sexual activity: Not on file  Other Topics Concern   Not on file  Social History Narrative   Lives in [place] with spouse   Retired Naval architect   Social Drivers of Corporate investment banker Strain: Low Risk  (11/01/2023)   Overall Financial Resource Strain (CARDIA)    Difficulty of Paying Living Expenses: Not hard at all  Food Insecurity: No Food Insecurity (11/01/2023)   Hunger Vital Sign    Worried About Running Out of Food in the Last Year: Never true    Ran Out of Food in the Last Year: Never true  Transportation Needs: No Transportation Needs (11/01/2023)   PRAPARE - Administrator, Civil Service (Medical): No    Lack of Transportation (Non-Medical): No  Physical Activity: Sufficiently Active (11/01/2023)   Exercise Vital Sign    Days of Exercise per Week: 3 days    Minutes of Exercise per Session: 60 min  Stress: No Stress Concern Present (11/01/2023)   [employer] of Occupational Health - Occupational Stress Questionnaire    Feeling of  Stress : Not at all  Social Connections: Moderately Isolated (11/01/2023)   Social Connection and Isolation Panel    Frequency of Communication with Friends and Family: More than three times a week    Frequency of Social Gatherings with Friends and Family: Once a week    Attends Religious Services: Never    Database administrator or Organizations: No    Attends Banker Meetings: Never    Marital Status: Married  Catering manager Violence: Not At Risk (11/01/2023)   Humiliation, Afraid, Rape, and Kick questionnaire    Fear of Current or Ex-Partner: No    Emotionally Abused: No    Physically Abused: No    Sexually Abused: No    Family History: Family History  Problem Relation Age of Onset   Stomach cancer Mother        smokes   Lung cancer Father        chewed tobacco   Heart disease Brother        first MI at 61yo, now 61 for transplant list/ ICM   Pancreatic cancer Brother    Colon cancer Maternal Grandfather    COPD Son        was a smoker   Pancreatic cancer Maternal Uncle    Pancreatic cancer Cousin        mat side x 4   Esophageal cancer Neg Hx    Prostate cancer Neg Hx    Rectal cancer Neg Hx     Current Medications:  Current Outpatient Medications:    albuterol  (VENTOLIN  HFA) 108 (90  Base) MCG/ACT inhaler, Inhale 2 puffs into the lungs every 6 (six) hours as needed for wheezing or shortness of breath., Disp: 8 g, Rfl: 0   Ascorbic Acid  (VITA-C PO), Take 1,000 mg by mouth daily., Disp: , Rfl:    b complex vitamins capsule, Take 1 capsule by mouth daily., Disp: , Rfl:    cholecalciferol  (VITAMIN D3) 25 MCG (1000 UNIT) tablet, Take 1,000 Units by mouth daily., Disp: , Rfl:    ezetimibe  (ZETIA ) 10 MG tablet, TAKE 1 TABLET EVERY DAY, Disp: 90 tablet, Rfl: 2   furosemide  (LASIX ) 20 MG tablet, TAKE 2 TABLETS EVERY DAY, Disp: 180 tablet, Rfl: 3   guaiFENesin  (MUCINEX ) 600 MG 12 hr tablet, Take 2 tablets (1,200 mg total) by mouth 2 (two) times daily as needed.,  Disp: 60 tablet, Rfl: 2   losartan  (COZAAR ) 25 MG tablet, TAKE 1 TABLET EVERY DAY, Disp: 90 tablet, Rfl: 1   meclizine  (ANTIVERT ) 12.5 MG tablet, Take 1 tablet (12.5 mg total) by mouth 3 (three) times daily as needed for dizziness., Disp: 40 tablet, Rfl: 1   meloxicam (MOBIC) 15 MG tablet, Take 15 mg by mouth daily., Disp: , Rfl:    metoprolol  succinate (TOPROL -XL) 50 MG 24 hr tablet, Take 1 tablet (50 mg total) by mouth daily. Take with or immediately following a meal., Disp: 90 tablet, Rfl: 3   metroNIDAZOLE  (FLAGYL ) 500 MG tablet, Take 1 tablet (500 mg total) by mouth 3 (three) times daily., Disp: 30 tablet, Rfl: 0   Multiple Vitamins-Minerals (MULTIVITAMIN WITH MINERALS) tablet, Take 1 tablet by mouth daily. Centrum silver, Disp: , Rfl:    potassium chloride  SA (KLOR-CON  M) 20 MEQ tablet, TAKE 1 TABLET EVERY DAY, Disp: 90 tablet, Rfl: 3   rosuvastatin  (CRESTOR ) 5 MG tablet, TAKE 1 TABLET EVERY OTHER DAY, Disp: 45 tablet, Rfl: 2   sildenafil  (VIAGRA ) 100 MG tablet, Take 0.5-1 tablets (50-100 mg total) by mouth daily as needed., Disp: 10 tablet, Rfl: 11   traZODone  (DESYREL ) 50 MG tablet, Take 0.5-1 tablets (25-50 mg total) by mouth at bedtime as needed., Disp: 90 tablet, Rfl: 1   warfarin (COUMADIN ) 5 MG tablet, TAKE 1 TABLET EVERY DAY EXCEPT TAKE 1 AND 1/2 TABLETS ON MONDAYS OR AS DIRECTED, Disp: 100 tablet, Rfl: 0   Allergies: Allergies  Allergen Reactions   Ace Inhibitors Cough   Statins Other (See Comments)    Muscle pains    REVIEW OF SYSTEMS:   Review of Systems  Constitutional:  Negative for chills, fatigue and fever.  HENT:   Negative for lump/mass, mouth sores, nosebleeds, sore throat and trouble swallowing.   Eyes:  Negative for eye problems.  Respiratory:  Positive for shortness of breath. Negative for cough.   Cardiovascular:  Negative for chest pain, leg swelling and palpitations.  Gastrointestinal:  Negative for abdominal pain, constipation, diarrhea, nausea and  vomiting.  Genitourinary:  Negative for bladder incontinence, difficulty urinating, dysuria, frequency, hematuria and nocturia.   Musculoskeletal:  Negative for arthralgias, back pain, flank pain, myalgias and neck pain.  Skin:  Negative for itching and rash.  Neurological:  Positive for numbness. Negative for dizziness and headaches.  Hematological:  Does not bruise/bleed easily.  Psychiatric/Behavioral:  Positive for sleep disturbance. Negative for depression and suicidal ideas. The patient is not nervous/anxious.   All other systems reviewed and are negative.    VITALS:   Blood pressure 95/65, pulse (!) 58, temperature 97.6 F (36.4 C), temperature source Oral, resp. rate 18, weight 195  lb 1.6 oz (88.5 kg), SpO2 97%.  Wt Readings from Last 3 Encounters:  03/06/24 195 lb 1.6 oz (88.5 kg)  02/04/24 200 lb (90.7 kg)  01/18/24 200 lb 6.4 oz (90.9 kg)    Body mass index is 27.21 kg/m.  Performance status (ECOG): 1 - Symptomatic but completely ambulatory  PHYSICAL EXAM:   Physical Exam Vitals and nursing note reviewed. Exam conducted with a chaperone present.  Constitutional:      Appearance: Normal appearance.  Cardiovascular:     Rate and Rhythm: Normal rate and regular rhythm.     Pulses: Normal pulses.     Heart sounds: Normal heart sounds.  Pulmonary:     Effort: Pulmonary effort is normal.     Breath sounds: Normal breath sounds.  Abdominal:     Palpations: Abdomen is soft. There is no hepatomegaly, splenomegaly or mass.     Tenderness: There is no abdominal tenderness.  Musculoskeletal:     Right lower leg: No edema.     Left lower leg: No edema.  Lymphadenopathy:     Cervical: No cervical adenopathy.     Right cervical: No superficial, deep or posterior cervical adenopathy.    Left cervical: No superficial, deep or posterior cervical adenopathy.     Upper Body:     Right upper body: No supraclavicular or axillary adenopathy.     Left upper body: No  supraclavicular or axillary adenopathy.  Neurological:     General: No focal deficit present.     Mental Status: He is alert and oriented to person, place, and time.  Psychiatric:        Mood and Affect: Mood normal.        Behavior: Behavior normal.     LABS:   CBC     Component Value Date/Time   WBC 6.3 02/28/2024 1343   RBC 3.71 (L) 02/28/2024 1343   HGB 12.0 (L) 02/28/2024 1343   HGB 15.0 12/18/2020 0901   HCT 36.0 (L) 02/28/2024 1343   HCT 44.2 12/18/2020 0901   PLT 108 (L) 02/28/2024 1343   PLT 122 (L) 12/18/2020 0901   MCV 97.0 02/28/2024 1343   MCV 93 12/18/2020 0901   MCH 32.3 02/28/2024 1343   MCHC 33.3 02/28/2024 1343   RDW 14.5 02/28/2024 1343   RDW 12.8 12/18/2020 0901   LYMPHSABS 4.0 02/28/2024 1343   LYMPHSABS 1.4 01/26/2020 0937   MONOABS 0.7 02/28/2024 1343   EOSABS 0.1 02/28/2024 1343   EOSABS 0.2 01/26/2020 0937   BASOSABS 0.0 02/28/2024 1343   BASOSABS 0.0 01/26/2020 0937    CMP      Component Value Date/Time   NA 136 02/28/2024 1343   NA 140 07/30/2022 1142   K 4.1 02/28/2024 1343   CL 100 02/28/2024 1343   CO2 28 02/28/2024 1343   GLUCOSE 110 (H) 02/28/2024 1343   BUN 28 (H) 02/28/2024 1343   BUN 26 07/30/2022 1142   CREATININE 1.26 (H) 02/28/2024 1343   CREATININE 1.02 11/06/2016 1255   CALCIUM  8.7 (L) 02/28/2024 1343   PROT 6.6 02/28/2024 1343   PROT 6.7 12/13/2019 1010   ALBUMIN  3.6 02/28/2024 1343   ALBUMIN  4.2 12/13/2019 1010   AST 33 02/28/2024 1343   ALT 29 02/28/2024 1343   ALKPHOS 67 02/28/2024 1343   BILITOT 1.4 (H) 02/28/2024 1343   BILITOT 0.8 12/13/2019 1010   GFRNONAA 58 (L) 02/28/2024 1343   GFRAA 73 01/26/2020 0937     No results found  for: CEA1, CEA / No results found for: CEA1, CEA No results found for: PSA1 No results found for: CAN199 No results found for: CAN125  No results found for: TOTALPROTELP, ALBUMINELP, A1GS, A2GS, BETS, BETA2SER, GAMS, MSPIKE, SPEI Lab Results   Component Value Date   IRONPCTSAT 23.0 03/22/2019   Lab Results  Component Value Date   LDH 156 02/28/2024   LDH 105 08/30/2023   LDH 182 12/04/2022     STUDIES:   CT ABDOMEN PELVIS W CONTRAST Result Date: 02/07/2024 CLINICAL DATA:  Left lower quadrant pain EXAM: CT ABDOMEN AND PELVIS WITH CONTRAST TECHNIQUE: Multidetector CT imaging of the abdomen and pelvis was performed using the standard protocol following bolus administration of intravenous contrast. RADIATION DOSE REDUCTION: This exam was performed according to the departmental dose-optimization program which includes automated exposure control, adjustment of the mA and/or kV according to patient size and/or use of iterative reconstruction technique. CONTRAST:  OMNIPAQUE  IOHEXOL  300 MG/ML  SOLN COMPARISON:  CT 10/31/2021, PET CT 11/12/2022 FINDINGS: Lower chest: Lung bases demonstrate no acute airspace disease. Minimal atelectasis or scar at the right base. Partially visualized intracardiac pacing leads. Hepatobiliary: Cholecystectomy. Stable prominence of the common bile duct likely due to postsurgical change. Pancreas: Unremarkable. No pancreatic ductal dilatation or surrounding inflammatory changes. Spleen: Normal in size without focal abnormality. Adrenals/Urinary Tract: Adrenal glands are normal. Kidneys show no hydronephrosis. Bladder is unremarkable. Stomach/Bowel: Stomach is within normal limits. Appendix appears normal. No evidence of bowel wall thickening, distention, or inflammatory changes. Diverticular disease of the colon without acute inflammation Vascular/Lymphatic: Aortic atherosclerosis. No enlarged abdominal or pelvic lymph nodes. Reproductive: Prostate is unremarkable. Penile prosthesis. Decompressed reservoir or in the anterior pelvis. Other: Negative for pelvic effusion or free air. Musculoskeletal: No acute osseous abnormality. Chronic superior endplate deformity at L3. IMPRESSION: 1. No CT evidence for acute  intra-abdominal or pelvic abnormality. 2. Diverticular disease of the colon without acute inflammation. 3. Aortic atherosclerosis. Aortic Atherosclerosis (ICD10-I70.0). Electronically Signed   By: Luke Bun M.D.   On: 02/07/2024 16:04

## 2024-03-07 ENCOUNTER — Encounter

## 2024-03-10 ENCOUNTER — Other Ambulatory Visit: Payer: Self-pay | Admitting: Cardiovascular Disease

## 2024-03-10 DIAGNOSIS — Z7901 Long term (current) use of anticoagulants: Secondary | ICD-10-CM

## 2024-03-13 ENCOUNTER — Ambulatory Visit (INDEPENDENT_AMBULATORY_CARE_PROVIDER_SITE_OTHER)

## 2024-03-13 DIAGNOSIS — I5022 Chronic systolic (congestive) heart failure: Secondary | ICD-10-CM | POA: Diagnosis not present

## 2024-03-13 DIAGNOSIS — Z95810 Presence of automatic (implantable) cardiac defibrillator: Secondary | ICD-10-CM

## 2024-03-13 NOTE — Progress Notes (Signed)
 EPIC Encounter for ICM Monitoring  Patient Name: [name] [name] is a 78 y.o. male Date: 03/13/2024 Primary Care Physican: Norleen Lynwood ORN, MD Primary Cardiologist: Pietro Electrophysiologist: Mealor 04/20/2022 Not weighing at home 05/07/2022 Weight: 181 lbs (does not weigh consistently at home) 09/29/2022 Office Weight: 190 lbs 11/17/2022 Weight: 185 lbs 04/05/2023 Office Weight: 192 lbs       08/03/2023 Weight: 190 lbs   11/16/2023 Weight: 199 lbs (he reports is baseline)      01/18/2024 Office Weight: 200 lbs       03/13/2024 Weight:  192 lbs                             Spoke with patient and heart failure questions reviewed.  Transmission results reviewed.  Pt reports weakness and SOB which he is following up Hao Meng, PA on 8/6.      DIET:  Pt uses no salt at home and occasionally eats out.       Optivol thoracic impedance suggesting possible fluid accumulation starting 7/29 and trending back toward baseline 8/3.     Prescribed: Furosemide  20 mg take 2 tablet(s) (40 mg total) by mouth daily     Labs: 02/28/2024 Creatinine 1.26, BUN 28, Potassium 4.1, Sodium 136 02/04/2024 Creatinine 1.37, BUN 32, Potassium 4.3, Sodium 141, GFR 49.29 12/31/2023 Creatinine 1.66, BUN 27, Potassium 4.3, Sodium 141, GFR 39.17  11/17/2023 Creatinine 1.52, BUN 28, Potassium 4.5, Sodium 140, GFR 43.58  11/16/2023 Creatinine 1.35, BUN 26, Potassium 4.3, Sodium 139, GFR 54 08/30/2023 Creatinine 1.35, BUN 32, Potassium 4.1, Sodium 135, GFR 54  A complete set of results can be found in Results Review.   Recommendations:  No changes and encouraged to call if experiencing any fluid symptoms.   Follow-up plan: ICM clinic phone appointment on 04/17/2024.   91 day device clinic remote transmission 05/02/2024.     EP/Cardiology Office Visits:  03/15/2024 with Hao Meng, PA (f/u SOB).   Recall 09/22/2024 with Dr Pietro.   Recall 01/08/2025 with Dr Nancey.    Copy of ICM check sent to Dr. Nancey.   3 month ICM trend:  03/13/2024.    12-14 Month ICM trend:     Mitzie GORMAN Garner, RN 03/13/2024 11:35 AM

## 2024-03-14 ENCOUNTER — Ambulatory Visit: Attending: Cardiology | Admitting: *Deleted

## 2024-03-14 ENCOUNTER — Other Ambulatory Visit: Payer: Self-pay | Admitting: Cardiology

## 2024-03-14 DIAGNOSIS — Z5181 Encounter for therapeutic drug level monitoring: Secondary | ICD-10-CM

## 2024-03-14 DIAGNOSIS — I513 Intracardiac thrombosis, not elsewhere classified: Secondary | ICD-10-CM | POA: Diagnosis not present

## 2024-03-14 LAB — POCT INR: INR: 1.4 — AB (ref 2.0–3.0)

## 2024-03-14 NOTE — Patient Instructions (Addendum)
 Increase warfarin to 1 tablet daily Continue greens Recheck in 4 wks per patient request  (against my advise)

## 2024-03-14 NOTE — Progress Notes (Signed)
 INR 1.4. Please see anticoagulation encounter

## 2024-03-15 ENCOUNTER — Encounter: Payer: Self-pay | Admitting: Hematology

## 2024-03-15 ENCOUNTER — Ambulatory Visit: Attending: Cardiology | Admitting: Physician Assistant

## 2024-03-15 ENCOUNTER — Encounter: Payer: Self-pay | Admitting: Physician Assistant

## 2024-03-15 ENCOUNTER — Other Ambulatory Visit (HOSPITAL_COMMUNITY): Payer: Self-pay

## 2024-03-15 VITALS — BP 110/68 | HR 65 | Ht 71.0 in | Wt 199.8 lb

## 2024-03-15 DIAGNOSIS — I739 Peripheral vascular disease, unspecified: Secondary | ICD-10-CM | POA: Diagnosis not present

## 2024-03-15 DIAGNOSIS — I240 Acute coronary thrombosis not resulting in myocardial infarction: Secondary | ICD-10-CM

## 2024-03-15 DIAGNOSIS — E785 Hyperlipidemia, unspecified: Secondary | ICD-10-CM | POA: Diagnosis not present

## 2024-03-15 DIAGNOSIS — R5383 Other fatigue: Secondary | ICD-10-CM | POA: Diagnosis not present

## 2024-03-15 DIAGNOSIS — Z95810 Presence of automatic (implantable) cardiac defibrillator: Secondary | ICD-10-CM | POA: Diagnosis not present

## 2024-03-15 DIAGNOSIS — I10 Essential (primary) hypertension: Secondary | ICD-10-CM | POA: Diagnosis not present

## 2024-03-15 DIAGNOSIS — I255 Ischemic cardiomyopathy: Secondary | ICD-10-CM

## 2024-03-15 DIAGNOSIS — I25810 Atherosclerosis of coronary artery bypass graft(s) without angina pectoris: Secondary | ICD-10-CM

## 2024-03-15 MED ORDER — METOPROLOL SUCCINATE ER 50 MG PO TB24
25.0000 mg | ORAL_TABLET | Freq: Two times a day (BID) | ORAL | 3 refills | Status: AC
Start: 2024-03-15 — End: ?
  Filled 2024-03-15: qty 90, 90d supply, fill #0

## 2024-03-15 NOTE — Patient Instructions (Addendum)
 Medication Instructions:  DISCONTINUE LOSARTAN   TAKE METOPROLOL  SUCCINATE 25 MG IN THE MORNING AND 25 MG IN THE EVENING *If you need a refill on your cardiac medications before your next appointment, please call your pharmacy*  Lab Work: NO LABS If you have labs (blood work) drawn today and your tests are completely normal, you will receive your results only by: MyChart Message (if you have MyChart) OR A paper copy in the mail If you have any lab test that is abnormal or we need to change your treatment, we will call you to review the results.  Testing/Procedures: NO TESTING  Follow-Up: At [facility] you and your health needs are our priority.  As part of our continuing mission to provide you with exceptional heart care, our providers are all part of one team.  This team includes your primary Cardiologist (physician) and Advanced Practice Providers or APPs (Physician Assistants and Nurse Practitioners) who all work together to provide you with the care you need, when you need it.  Your next appointment:   6 week(s)  Provider:   [name], PA-C

## 2024-03-15 NOTE — Progress Notes (Unsigned)
 Cardiology Office Note   Date:  03/15/2024  ID:  [name], [name] Jan 09, 1946, MRN [id number] PCP: Norleen Lynwood ORN, MD  [facility] Providers Cardiologist:  Redell Shallow, MD Electrophysiologist:  Eulas FORBES Furbish, MD { Click to update primary MD,subspecialty MD or APP then REFRESH:1}    History of Present Illness [name] [name] is a 78 y.o. male with PMH of stage IV B-cell lymphoma, CAD s/p minimally invasive LIMA-LAD, ischemic cardiomyopathy, apical thrombus on Coumadin , hypertension, hyperlipidemia, history of Medtronic dual-chamber ICD, aortic stenosis, and PAD s/p right above-knee popliteal to tibioperoneal trunk/posterior tibial artery bypass in October 2023.  He has history of CAD dating back to 66 when he had MI.  Said he subsequently underwent a minimally invasive LIMA-LAD.  A cardiac MRI in July 2020 showed EF 30%, apical thrombus, mild aortic and mitral regurgitation.  He had ICD placed in June 2021.  Nuclear stress study in June 2022 showed EF 29% and the infarct but no ischemia.  Echocardiogram in June 2023 showed EF 30 to 35%, akinesis of the apex, anteroseptal and inferoseptal wall, moderate LAE, moderate AI, mild aortic stenosis.  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  Arteriogram in October 2023 showed large popliteal aneurysm on the right with occlusion of the distal peroneal artery.  He subsequently underwent right above-knee popliteal artery to tibioperoneal trunk/posterior tibial artery bypass was saphenous vein graft and ligation of the right popliteal artery aneurysm in October 2023.  Chest x-ray October 2024 showed left infrahilar density and chest CT was recommended.  Most recent echocardiogram obtained on 11/26/2023 showed EF 40 to 45%, small rounded echodensity measuring 0.39 x 0.46 cm along with a swirling pattern of contrast in the apex highly suspicious for LV thrombus, moderate concentric LVH, grade 1 DD, mid and distal  anterior septum, mid inferoseptal segment and apical anterior segment akinesis.  More recently, patient was seen by Dr. Furbish in June 2025 after episode of sustained VT.  He was asymptomatic.  VF zone was reprogrammed to a rate of 220.  Metoprolol  succinate was increased to 50 mg daily.  Recent lower extremity Doppler obtained on 01/18/2024 showed patent right popliteal to tibioperoneal trunk bypass graft without evidence of stenosis.  Patient presents today accompanied by wife.  He has been noticing fatigue, weakness and a dizzy spell for the past several months.  Dizzy spell primarily occurs as well as body position changes.  I encouraged him to drink between 32 to 64 ounces of fluid per day.  I suspect his weakness and fatigue may be related to low blood pressure.  He also described possible pneumonia a few weeks back, this has been treated.  I recommend discontinuation of losartan .  I will also split his metoprolol  succinate to 25 mg twice a day.  He denies any chest pain, with recent reassuring echocardiogram, I did not recommend any ischemic workup.  He is lower extremity is weak, however ABI and LEA demonstrated good blood flow.  I plan to reassess with patient in 6 weeks.   ROS: ***  Studies Reviewed      *** Risk Assessment/Calculations {Does this patient have ATRIAL FIBRILLATION?:[phone]}         Physical Exam VS:  BP 110/68   Pulse 65   Ht 5' 11 (1.803 m)   Wt 199 lb 12.8 oz (90.6 kg)   SpO2 97%   BMI 27.87 kg/m        Wt Readings  from Last 3 Encounters:  03/15/24 199 lb 12.8 oz (90.6 kg)  03/06/24 195 lb 1.6 oz (88.5 kg)  02/04/24 200 lb (90.7 kg)    GEN: Well nourished, well developed in no acute distress NECK: No JVD; No carotid bruits CARDIAC: ***RRR, no murmurs, rubs, gallops RESPIRATORY:  Clear to auscultation without rales, wheezing or rhonchi  ABDOMEN: Soft, non-tender, non-distended EXTREMITIES:  No edema; No deformity   ASSESSMENT AND PLAN ***    {Are  you ordering a CV Procedure (e.g. stress test, cath, DCCV, TEE, etc)?   Press F2        :[id number]}  Dispo: ***  Signed, [name], PA

## 2024-03-29 ENCOUNTER — Ambulatory Visit: Admitting: Gastroenterology

## 2024-04-04 ENCOUNTER — Ambulatory Visit: Payer: Self-pay

## 2024-04-04 NOTE — Telephone Encounter (Signed)
 FYI Only or Action Required?: FYI only for provider.  Patient was last seen in primary care on 02/04/2024 by Norleen Lynwood ORN, MD.  Called Nurse Triage reporting Fatigue.  Symptoms began several months ago.  Interventions attempted: Rest, hydration, or home remedies.  Symptoms are: unchanged.  Triage Disposition: See Physician Within 24 Hours  Patient/caregiver understands and will follow disposition?: Yes    Copied from CRM #[id number]. Topic: Clinical - Red Word Triage >> Apr 04, 2024  4:01 PM Lavanda D wrote: Red Word that prompted transfer to Nurse Triage: Patient has been feeling weak lately, been checked by heart doctor and cancer doctor and they have not found anything. He is wondering if it could be low iron. Reason for Disposition  [1] MODERATE weakness (e.g., interferes with work, school, normal activities) AND [2] persists > 3 days  Answer Assessment - Initial Assessment Questions 1. DESCRIPTION: Describe how you are feeling.     Just weak 2. SEVERITY: How bad is it?  Can you stand and walk?     Yes can walk but can't walk far before having to sit down.  3. ONSET: When did these symptoms begin? (e.g., hours, days, weeks, months)     A couple months 4. CAUSE: What do you think is causing the weakness or fatigue? (e.g., not drinking enough fluids, medical problem, trouble sleeping)     unknown 5. NEW MEDICINES:  Have you started on any new medicines recently? (e.g., opioid pain medicines, benzodiazepines, muscle relaxants, antidepressants, antihistamines, neuroleptics, beta blockers)     no 6. OTHER SYMPTOMS: Do you have any other symptoms? (e.g., chest pain, fever, cough, SOB, vomiting, diarrhea, bleeding, other areas of pain)     no  Protocols used: Weakness (Generalized) and Fatigue-A-AH

## 2024-04-05 ENCOUNTER — Encounter: Payer: Self-pay | Admitting: Internal Medicine

## 2024-04-05 ENCOUNTER — Ambulatory Visit (INDEPENDENT_AMBULATORY_CARE_PROVIDER_SITE_OTHER): Admitting: Internal Medicine

## 2024-04-05 ENCOUNTER — Ambulatory Visit: Payer: Self-pay | Admitting: Internal Medicine

## 2024-04-05 VITALS — BP 134/82 | HR 66 | Temp 97.7°F | Ht 71.0 in | Wt 202.0 lb

## 2024-04-05 DIAGNOSIS — R739 Hyperglycemia, unspecified: Secondary | ICD-10-CM

## 2024-04-05 DIAGNOSIS — E7800 Pure hypercholesterolemia, unspecified: Secondary | ICD-10-CM | POA: Diagnosis not present

## 2024-04-05 DIAGNOSIS — D649 Anemia, unspecified: Secondary | ICD-10-CM | POA: Diagnosis not present

## 2024-04-05 DIAGNOSIS — R29898 Other symptoms and signs involving the musculoskeletal system: Secondary | ICD-10-CM | POA: Diagnosis not present

## 2024-04-05 DIAGNOSIS — E559 Vitamin D deficiency, unspecified: Secondary | ICD-10-CM | POA: Diagnosis not present

## 2024-04-05 LAB — CBC WITH DIFFERENTIAL/PLATELET
Basophils Absolute: 0 K/uL (ref 0.0–0.1)
Basophils Relative: 0.6 % (ref 0.0–3.0)
Eosinophils Absolute: 0.1 K/uL (ref 0.0–0.7)
Eosinophils Relative: 2.9 % (ref 0.0–5.0)
HCT: 42.7 % (ref 39.0–52.0)
Hemoglobin: 14.3 g/dL (ref 13.0–17.0)
Lymphocytes Relative: 52.3 % — ABNORMAL HIGH (ref 12.0–46.0)
Lymphs Abs: 2.4 K/uL (ref 0.7–4.0)
MCHC: 33.5 g/dL (ref 30.0–36.0)
MCV: 95.9 fl (ref 78.0–100.0)
Monocytes Absolute: 0.4 K/uL (ref 0.1–1.0)
Monocytes Relative: 8.7 % (ref 3.0–12.0)
Neutro Abs: 1.7 K/uL (ref 1.4–7.7)
Neutrophils Relative %: 35.5 % — ABNORMAL LOW (ref 43.0–77.0)
Platelets: 119 K/uL — ABNORMAL LOW (ref 150.0–400.0)
RBC: 4.46 Mil/uL (ref 4.22–5.81)
RDW: 13.5 % (ref 11.5–15.5)
WBC: 4.7 K/uL (ref 4.0–10.5)

## 2024-04-05 LAB — HEPATIC FUNCTION PANEL
ALT: 15 U/L (ref 0–53)
AST: 24 U/L (ref 0–37)
Albumin: 4.4 g/dL (ref 3.5–5.2)
Alkaline Phosphatase: 57 U/L (ref 39–117)
Bilirubin, Direct: 0.2 mg/dL (ref 0.0–0.3)
Total Bilirubin: 0.8 mg/dL (ref 0.2–1.2)
Total Protein: 7.5 g/dL (ref 6.0–8.3)

## 2024-04-05 LAB — LIPID PANEL
Cholesterol: 168 mg/dL (ref 0–200)
HDL: 34.9 mg/dL — ABNORMAL LOW (ref >39.00)
LDL Cholesterol: 70 mg/dL (ref 0–99)
NonHDL: 133.03
Total CHOL/HDL Ratio: 5
Triglycerides: 317 mg/dL — ABNORMAL HIGH (ref 0.0–149.0)
VLDL: 63.4 mg/dL — ABNORMAL HIGH (ref 0.0–40.0)

## 2024-04-05 LAB — TSH: TSH: 3.65 u[IU]/mL (ref 0.35–5.50)

## 2024-04-05 LAB — BASIC METABOLIC PANEL WITH GFR
BUN: 22 mg/dL (ref 6–23)
CO2: 31 meq/L (ref 19–32)
Calcium: 9.4 mg/dL (ref 8.4–10.5)
Chloride: 98 meq/L (ref 96–112)
Creatinine, Ser: 1.36 mg/dL (ref 0.40–1.50)
GFR: 49.67 mL/min — ABNORMAL LOW (ref >60.00)
Glucose, Bld: 104 mg/dL — ABNORMAL HIGH (ref 70–99)
Potassium: 4.2 meq/L (ref 3.5–5.1)
Sodium: 139 meq/L (ref 135–145)

## 2024-04-05 LAB — VITAMIN B12: Vitamin B-12: 602 pg/mL (ref 211–911)

## 2024-04-05 LAB — HEMOGLOBIN A1C: Hgb A1c MFr Bld: 5.1 % (ref 4.6–6.5)

## 2024-04-05 NOTE — Assessment & Plan Note (Signed)
 Last vitamin D Lab Results  Component Value Date   VD25OH 59.72 11/17/2023   Stable, cont oral replacement

## 2024-04-05 NOTE — Patient Instructions (Signed)
 Please continue all other medications as before, and refills have been done if requested.  Please have the pharmacy call with any other refills you may need.  Please keep your appointments with your specialists as you may have planned  Please go to the LAB at the blood drawing area for the tests to be done  You will be contacted by phone if any changes need to be made immediately.  Otherwise, you will receive a letter about your results with an explanation, but please check with MyChart first.  Please make an Appointment to return in 6 months, or sooner if needed

## 2024-04-05 NOTE — Assessment & Plan Note (Signed)
 Etiology unclear, exam benign, I suggested MRI LS spine but he declines at this time

## 2024-04-05 NOTE — Assessment & Plan Note (Signed)
 Lab Results  Component Value Date   WBC 4.7 04/05/2024   HGB 14.3 04/05/2024   HCT 42.7 04/05/2024   MCV 95.9 04/05/2024   PLT 119.0 (L) 04/05/2024  Resolved, has chronic stable low plt

## 2024-04-05 NOTE — Progress Notes (Signed)
 Patient ID: [name] [name], male   DOB: 1946-02-01, 78 y.o.   MRN: [id number]        Chief Complaint: follow up bilateral leg weakness, anemia, hyperglycemia, hld, low vit d       HPI:  [name] [name] is a 78 y.o. male here with c/o persistent bilateral leg weakness without signifiicant lower back pain or radicular pain.  No falls.  Pt declines need for PT evaluation.  Pt denies chest pain, increased sob or doe, wheezing, orthopnea, PND, increased LE swelling, palpitations, dizziness or syncope.   Pt denies polydipsia, polyuria, or new focal neuro s/s.    Pt denies fever, wt loss, night sweats, loss of appetite, or other constitutional symptoms  In fact has gained several lbs recently.          Wt Readings from Last 3 Encounters:  04/05/24 202 lb (91.6 kg)  03/15/24 199 lb 12.8 oz (90.6 kg)  03/06/24 195 lb 1.6 oz (88.5 kg)   BP Readings from Last 3 Encounters:  04/05/24 134/82  03/15/24 110/68  03/06/24 95/65         Past Medical History:  Diagnosis Date   AICD (automatic cardioverter/defibrillator) present    ALLERGIC RHINITIS 10/31/2007   Qualifier: Diagnosis of  By: Norleen MD, Lynwood ORN    Allergy    Cancer (HCC) 12/03/2021   non-Hodgkin's B-cell lymphoma   CHOLECYSTECTOMY, HX OF 06/04/2007   Qualifier: Diagnosis of  By: Nickola CMA, [place]     Chronic HFrEF (heart failure with reduced ejection fraction) (HCC)    COLONIC POLYPS, HX OF 10/31/2007   Qualifier: Diagnosis of  By: Norleen MD, Lynwood ORN    CORONARY ARTERY BYPASS GRAFT, HX OF 06/04/2007   Qualifier: Diagnosis of  By: Nickola CMA, [place]     CORONARY ARTERY DISEASE 06/04/2007   Qualifier: Diagnosis of  By: Nickola CMA, [place]     Diverticulosis    Erectile dysfunction    Eye twitch    right eye since chilhood    History of hiatal hernia    HYPERLIPIDEMIA 06/04/2007   Qualifier: Diagnosis of  By: Nickola CMA, [place]     Hypersomnolence    HYPERTENSION 06/04/2007   Qualifier: Diagnosis of  By: Nickola CMA, [place]      Hypothyroidism    pt does not take medications and doesn't know if he has it   ISCHEMIC CARDIOMYOPATHY 06/04/2007   Qualifier: Diagnosis of  By: Nickola CMA, [place]     Ischemic cardiomyopathy    LV (left ventricular) mural thrombus 02/15/2019   Myocardial infarction [facility])    per patient , his occurred in 1998    Osteoarthritis    Peripheral vascular disease (HCC)    PLMD (periodic limb movement disorder)    Pneumonia    Vertigo    Vitamin D  deficiency    Past Surgical History:  Procedure Laterality Date   BIOPSY  11/25/2021   Procedure: BIOPSY;  Surgeon: Charlanne Groom, MD;  Location: [facility];  Service: Gastroenterology;;   CABG  1999   [facility] [place], [place].  Patient reports that a small vein was taken from under his arm and a small incesion was made.   CHOLECYSTECTOMY     COLONOSCOPY     COLONOSCOPY WITH PROPOFOL  N/A 11/25/2021   Procedure: COLONOSCOPY WITH PROPOFOL ;  Surgeon: Charlanne Groom, MD;  Location: [facility];  Service: Gastroenterology;  Laterality: N/A;   ENDOVASCULAR REPAIR OF POPLITEAL ARTERY ANEURYSM  Right 05/20/2022   Procedure: LIGATION OF POPLITEAL ARTERY ANEURYSM;  Surgeon: Gretta Lonni PARAS, MD;  Location: [facility] OR;  Service: Vascular;  Laterality: Right;   ESOPHAGOGASTRODUODENOSCOPY (EGD) WITH PROPOFOL  N/A 11/25/2021   Procedure: ESOPHAGOGASTRODUODENOSCOPY (EGD) WITH PROPOFOL ;  Surgeon: Charlanne Groom, MD;  Location: [facility];  Service: Gastroenterology;  Laterality: N/A;   FEMORAL-POPLITEAL BYPASS GRAFT Right 05/20/2022   Procedure: RIGHT ABOVE KNEE POPLITEAL ARTERY TO TIBIOPERONEAL TRUNK/POSTERIOR TIBIAL ARTERY BYPASS;  Surgeon: Gretta Lonni PARAS, MD;  Location: [facility] OR;  Service: Vascular;  Laterality: Right;   ICD IMPLANT N/A 01/30/2020   Procedure: ICD IMPLANT;  Surgeon: Kelsie Agent, MD;  Location: [facility] INVASIVE CV LAB;  Service: Cardiovascular;  Laterality: N/A;   INCISION AND DRAINAGE Right 01/08/2023    Procedure: INCISION AND DRAINAGE OF RIGHT LEG  SEROMA, DRAIN PLACEMENT AND MYRIAD MORCELLS;  Surgeon: Gretta Lonni PARAS, MD;  Location: [facility] OR;  Service: Vascular;  Laterality: Right;   IR IMAGING GUIDED PORT INSERTION  12/04/2021   KNEE ARTHROPLASTY Left 08/05/2017   Procedure: LEFT TOTAL KNEE ARTHROPLASTY WITH COMPUTER NAVIGATION;  Surgeon: Fidel Rogue, MD;  Location: [facility] ORS;  Service: Orthopedics;  Laterality: Left;  Needs RNFA   LIGATION OF CILIAC ARTERY Right 05/20/2022   Procedure: LIGATION OF RIGHT ANTERIOR TIBIAL ARTERY;  Surgeon: Gretta Lonni PARAS, MD;  Location: [facility] OR;  Service: Vascular;  Laterality: Right;   LOWER EXTREMITY ANGIOGRAPHY Right 05/14/2022   Procedure: Lower Extremity Angiography;  Surgeon: Gretta Lonni PARAS, MD;  Location: [facility] INVASIVE CV LAB;  Service: Cardiovascular;  Laterality: Right;   PENILE PROSTHESIS IMPLANT     POLYPECTOMY  11/25/2021   Procedure: POLYPECTOMY;  Surgeon: Charlanne Groom, MD;  Location: [facility];  Service: Gastroenterology;;   REPLACEMENT TOTAL KNEE  06/12/2013   spinal cyst     THORACOTOMY     left anterior; wound exploration and debridement   TONSILLECTOMY AND ADENOIDECTOMY     age 3   VEIN HARVEST Right 05/20/2022   Procedure: RIGHT GREATER SAPHENOUS VEIN HARVEST;  Surgeon: Gretta Lonni PARAS, MD;  Location: [facility] OR;  Service: Vascular;  Laterality: Right;    reports that he quit smoking about 42 years ago. His smoking use included cigarettes. He started smoking about 59 years ago. He has a 68 pack-year smoking history. He has been exposed to tobacco smoke. He has never used smokeless tobacco. He reports current alcohol  use of about 2.0 standard drinks of alcohol  per week. He reports that he does not use drugs. family history includes COPD in his son; Colon cancer in his maternal grandfather; Heart disease in his brother; Lung cancer in his father; Pancreatic cancer in his brother, cousin, and maternal uncle; Stomach cancer in  his mother. Allergies  Allergen Reactions   Ace Inhibitors Cough   Statins Other (See Comments)    Muscle pains   Current Outpatient Medications on File Prior to Visit  Medication Sig Dispense Refill   Ascorbic Acid  (VITA-C PO) Take 1,000 mg by mouth daily.     b complex vitamins capsule Take 1 capsule by mouth daily.     cholecalciferol  (VITAMIN D3) 25 MCG (1000 UNIT) tablet Take 1,000 Units by mouth daily.     ezetimibe  (ZETIA ) 10 MG tablet TAKE 1 TABLET EVERY DAY 90 tablet 3   furosemide  (LASIX ) 20 MG tablet TAKE 2 TABLETS EVERY DAY 180 tablet 3   guaiFENesin  (MUCINEX ) 600 MG 12 hr tablet Take 2 tablets (1,200 mg total) by mouth 2 (two) times daily as  needed. 60 tablet 2   meclizine  (ANTIVERT ) 12.5 MG tablet Take 1 tablet (12.5 mg total) by mouth 3 (three) times daily as needed for dizziness. 40 tablet 1   meloxicam (MOBIC) 15 MG tablet Take 15 mg by mouth daily.     metoprolol  succinate (TOPROL -XL) 50 MG 24 hr tablet Take 1/2 tablet (25 mg total) by mouth in the morning and 1/2 tablet in the evening. 90 tablet 3   Multiple Vitamins-Minerals (MULTIVITAMIN WITH MINERALS) tablet Take 1 tablet by mouth daily. Centrum silver     rosuvastatin  (CRESTOR ) 5 MG tablet TAKE 1 TABLET EVERY OTHER DAY 45 tablet 2   traZODone  (DESYREL ) 50 MG tablet Take 0.5-1 tablets (25-50 mg total) by mouth at bedtime as needed. 90 tablet 1   warfarin (COUMADIN ) 5 MG tablet TAKE 1 TABLET EVERY DAY EXCEPT TAKE 1 AND 1/2 TABLETS ON MONDAYS OR AS DIRECTED (Patient taking differently: TAKE 1 TABLET EVERY DAY) 100 tablet 3   No current facility-administered medications on file prior to visit.        ROS:  All others reviewed and negative.  Objective        PE:  BP 134/82   Pulse 66   Temp 97.7 F (36.5 C)   Ht 5' 11 (1.803 m)   Wt 202 lb (91.6 kg)   SpO2 96%   BMI 28.17 kg/m                 Constitutional: Pt appears in NAD               HENT: Head: NCAT.                Right Ear: External ear normal.                  Left Ear: External ear normal.                Eyes: . Pupils are equal, round, and reactive to light. Conjunctivae and EOM are normal               Nose: without d/c or deformity               Neck: Neck supple. Gross normal ROM               Cardiovascular: Normal rate and regular rhythm.                 Pulmonary/Chest: Effort normal and breath sounds without rales or wheezing.                Abd:  Soft, NT, ND, + BS, no organomegaly               Neurological: Pt is alert. At baseline orientation, motor grossly intact, cn 2-12 intact               Skin: Skin is warm. No rashes, no other new lesions, LE edema - none               Psychiatric: Pt behavior is normal without agitation   Micro: none  Cardiac tracings I have personally interpreted today:  none  Pertinent Radiological findings (summarize): none   Lab Results  Component Value Date   WBC 4.7 04/05/2024   HGB 14.3 04/05/2024   HCT 42.7 04/05/2024   PLT 119.0 (L) 04/05/2024   GLUCOSE 104 (H) 04/05/2024   CHOL 168 04/05/2024   TRIG 317.0 (H) 04/05/2024  HDL 34.90 (L) 04/05/2024   LDLDIRECT 102.0 10/09/2021   LDLCALC 70 04/05/2024   ALT 15 04/05/2024   AST 24 04/05/2024   NA 139 04/05/2024   K 4.2 04/05/2024   CL 98 04/05/2024   CREATININE 1.36 04/05/2024   BUN 22 04/05/2024   CO2 31 04/05/2024   TSH 3.65 04/05/2024   PSA 1.23 10/09/2021   INR 1.4 (A) 03/14/2024   HGBA1C 5.1 04/05/2024   Assessment/Plan:  [name] [name] is a 78 y.o. White or Caucasian [1] male with  has a past medical history of AICD (automatic cardioverter/defibrillator) present, ALLERGIC RHINITIS (10/31/2007), Allergy, Cancer (HCC) (12/03/2021), CHOLECYSTECTOMY, HX OF (06/04/2007), Chronic HFrEF (heart failure with reduced ejection fraction) (HCC), COLONIC POLYPS, HX OF (10/31/2007), CORONARY ARTERY BYPASS GRAFT, HX OF (06/04/2007), CORONARY ARTERY DISEASE (06/04/2007), Diverticulosis, Erectile dysfunction, Eye twitch, History of  hiatal hernia, HYPERLIPIDEMIA (06/04/2007), Hypersomnolence, HYPERTENSION (06/04/2007), Hypothyroidism, ISCHEMIC CARDIOMYOPATHY (06/04/2007), Ischemic cardiomyopathy, LV (left ventricular) mural thrombus (02/15/2019), Myocardial infarction (HCC), Osteoarthritis, Peripheral vascular disease (HCC), PLMD (periodic limb movement disorder), Pneumonia, Vertigo, and Vitamin D  deficiency.  Anemia Lab Results  Component Value Date   WBC 4.7 04/05/2024   HGB 14.3 04/05/2024   HCT 42.7 04/05/2024   MCV 95.9 04/05/2024   PLT 119.0 (L) 04/05/2024  Resolved, has chronic stable low plt  Bilateral leg weakness Etiology unclear, exam benign, I suggested MRI LS spine but he declines at this time  Hyperglycemia Lab Results  Component Value Date   HGBA1C 5.1 04/05/2024   Stable, pt to continue current medical treatment  - diet, wt control   Hyperlipidemia Lab Results  Component Value Date   LDLCALC 70 04/05/2024   Uncontrolled, pt to continue current statin crestor  5 mg and zetia  10 mg and lower chol diet, declines other change   Vitamin D  deficiency Last vitamin D  Lab Results  Component Value Date   VD25OH 59.72 11/17/2023   Stable, cont oral replacement  Followup: Return in about 6 months (around 10/06/2024).  Lynwood Rush, MD 04/05/2024 8:32 PM [facility]

## 2024-04-05 NOTE — Assessment & Plan Note (Signed)
 Lab Results  Component Value Date   HGBA1C 5.1 04/05/2024   Stable, pt to continue current medical treatment  - diet, wt control

## 2024-04-05 NOTE — Progress Notes (Signed)
 The test results show that your current treatment is OK, as the tests are stable.  Please continue the same plan.  There is no other need for change of treatment or further evaluation based on these results, at this time.  thanks

## 2024-04-05 NOTE — Assessment & Plan Note (Signed)
 Lab Results  Component Value Date   LDLCALC 70 04/05/2024   Uncontrolled, pt to continue current statin crestor  5 mg and zetia  10 mg and lower chol diet, declines other change

## 2024-04-11 ENCOUNTER — Telehealth: Payer: Self-pay | Admitting: Oncology

## 2024-04-11 ENCOUNTER — Encounter

## 2024-04-11 NOTE — Telephone Encounter (Signed)
 Faxed RFS form to [phone] F

## 2024-04-17 ENCOUNTER — Ambulatory Visit: Attending: Cardiovascular Disease

## 2024-04-17 ENCOUNTER — Ambulatory Visit

## 2024-04-17 DIAGNOSIS — Z95810 Presence of automatic (implantable) cardiac defibrillator: Secondary | ICD-10-CM

## 2024-04-17 DIAGNOSIS — I5022 Chronic systolic (congestive) heart failure: Secondary | ICD-10-CM | POA: Diagnosis not present

## 2024-04-18 ENCOUNTER — Ambulatory Visit: Attending: Cardiology

## 2024-04-18 DIAGNOSIS — I513 Intracardiac thrombosis, not elsewhere classified: Secondary | ICD-10-CM

## 2024-04-18 DIAGNOSIS — Z5181 Encounter for therapeutic drug level monitoring: Secondary | ICD-10-CM

## 2024-04-18 LAB — POCT INR: INR: 2.1 (ref 2.0–3.0)

## 2024-04-18 NOTE — Patient Instructions (Signed)
 Continue 1 tablet daily Continue greens Recheck in 4 wks

## 2024-04-21 NOTE — Progress Notes (Signed)
 EPIC Encounter for ICM Monitoring  Patient Name: [name] [name] is a 78 y.o. male Date: 04/21/2024 Primary Care Physican: Norleen Lynwood ORN, MD Primary Cardiologist: Pietro Electrophysiologist: Mealor 04/20/2022 Not weighing at home 05/07/2022 Weight: 181 lbs (does not weigh consistently at home) 09/29/2022 Office Weight: 190 lbs 11/17/2022 Weight: 185 lbs 04/05/2023 Office Weight: 192 lbs       08/03/2023 Weight: 190 lbs   11/16/2023 Weight: 199 lbs (he reports is baseline)      01/18/2024 Office Weight: 200 lbs       03/13/2024 Weight:  192 lbs   03/15/2024 Office Weight: 199 lbs  04/21/2024 Weight: 203 lbs                          Spoke with patient and heart failure questions reviewed.  Transmission results reviewed.  Pt asymptomatic for fluid accumulation.  He is currently at [facility] fishing.    He still has weakness but dizziness has improved after Hao Meng, PA changed his medication at 8/6.  Per 03/15/2024 [name], PA note, Losartan  discontinued and advised to split metoprolol  succinate to 25 mg twice a day to help improve blood pressure.  [facility] is coming from a problem with his lumbar spine and plans on doing MRI.   He provided [facility] Nancey for them to follow up about an MRI.      DIET:  His wife had back surgery this past month, August and they ate more restaurant foods.      Optivol thoracic impedance suggesting possible fluid accumulation from 7/29-8/23.  Suggesting normal fluid levels since 8/31.  Possible OptiVol fluid accumulation: 23-Mar-2024 -- 12-Apr-2024   Prescribed: Furosemide  20 mg take 2 tablet(s) (40 mg total) by mouth daily     Labs: 04/05/2024 Creatinine 1.36, BUN 22, Potassium 4.2, Sodium 139, GFR 49.67 02/28/2024 Creatinine 1.26, BUN 28, Potassium 4.1, Sodium 136 02/04/2024 Creatinine 1.37, BUN 32, Potassium 4.3, Sodium 141, GFR 49.29 12/31/2023 Creatinine 1.66, BUN 27, Potassium 4.3, Sodium 141, GFR 39.17  11/17/2023  Creatinine 1.52, BUN 28, Potassium 4.5, Sodium 140, GFR 43.58  11/16/2023 Creatinine 1.35, BUN 26, Potassium 4.3, Sodium 139, GFR 54 08/30/2023 Creatinine 1.35, BUN 32, Potassium 4.1, Sodium 135, GFR 54  A complete set of results can be found in Results Review.   Recommendations:  No changes and encouraged to call if experiencing any fluid symptoms.   Follow-up plan: ICM clinic phone appointment on 05/02/2024 to recheck fluid levels for 9/23 OV with [name], PA.   91 day device clinic remote transmission 05/02/2024.     EP/Cardiology Office Visits:  05/02/2024 with Hao Meng, PA.   Recall 09/22/2024 with Dr Pietro.   Recall 01/08/2025 with Dr Nancey.    Copy of ICM check sent to Dr. Nancey.   3 month ICM trend: 04/17/2024.    12-14 Month ICM trend:     Mitzie GORMAN Garner, RN 04/21/2024 8:50 AM

## 2024-04-22 ENCOUNTER — Other Ambulatory Visit: Payer: Self-pay | Admitting: Cardiology

## 2024-04-25 NOTE — Progress Notes (Signed)
 INR 2.1; Please see anticoagulation encounter

## 2024-04-26 ENCOUNTER — Telehealth: Payer: Self-pay | Admitting: Radiology

## 2024-04-26 NOTE — Telephone Encounter (Signed)
 Copied from CRM [id number]. Topic: Clinical - Medication Question >> Apr 26, 2024  8:39 AM Berneda FALCON wrote: Reason for CRM: Patient has a head cold and would like to know if he can get this refilled? It is discontinued currently and has no refills.  HYDROcodone  bit-homatropine (HYCODAN) 5-1.5 MG/5ML syrup    If so, can we please send to  [id number] - [place], [place] - [address] [place] [address] Phone: [phone] Fax: [phone] Hours: Not open 24 hours  Patient callback is [phone]

## 2024-04-27 ENCOUNTER — Ambulatory Visit: Payer: Self-pay

## 2024-04-27 NOTE — Telephone Encounter (Signed)
 FYI Only or Action Required?: FYI only for provider.  Patient was last seen in primary care on 04/05/2024 by [name] [name] ORN, MD.  Called Nurse Triage reporting Sore Throat and Nasal Congestion.  Symptoms began a week ago.  Interventions attempted: OTC medications: dayquil.  Symptoms are: gradually worsening.  Triage Disposition: See Physician Within 24 Hours  Patient/caregiver understands and will follow disposition?: Yes     Copied from CRM #[id number]. Topic: Clinical - Medication Question >> Apr 27, 2024  3:53 PM [name] wrote: Reason for CRM: Patient called to follow up on request from yesterday for the HYDROcodone  bit-homatropine (HYCODAN) 5-1.5 MG/5ML syrup - says he has a head cold, discharge of black/yellow, and sore throat. He believes this medication will help. Reason for Disposition  Coughing up rusty-colored (reddish-brown) sputum  Answer Assessment - Initial Assessment Questions 1. ONSET: When did the nasal discharge start?      X 1 week 2. AMOUNT: How much discharge is there?      Endorses dried blood in nose 3. COUGH: Do you have a cough? If Yes, ask: Describe the color of your mucus. (e.g., clear, white, yellow, green)     Yes, black mucus Endorses has had black mucus before and was dx with PNA 4. RESPIRATORY DISTRESS: Describe your breathing.      Denies [facility]ager does not appreciate audible SOB/wheezing during call. Pt is speaking in full sentences.  5. FEVER: Do you have a fever? If Yes, ask: What is your temperature, how was it measured, and when did it start?     denies 6. SEVERITY: Overall, how bad are you feeling right now? (e.g., doesn't interfere with normal activities, staying home from school/work, staying in bed)      I feel weak 7. OTHER SYMPTOMS: Do you have any other symptoms? (e.g., earache, mouth sores, sore throat, wheezing)     Sore throat, weak, fatigue 8. PREGNANCY: Is there any chance you are pregnant? When was your  last menstrual period?     N/a  Protocols used: Common Cold-A-AH, Cough - Acute Productive-A-AH

## 2024-04-28 ENCOUNTER — Ambulatory Visit (INDEPENDENT_AMBULATORY_CARE_PROVIDER_SITE_OTHER)

## 2024-04-28 VITALS — BP 130/79 | HR 60 | Ht 71.0 in | Wt 197.0 lb

## 2024-04-28 DIAGNOSIS — R29898 Other symptoms and signs involving the musculoskeletal system: Secondary | ICD-10-CM

## 2024-04-28 DIAGNOSIS — J069 Acute upper respiratory infection, unspecified: Secondary | ICD-10-CM

## 2024-04-28 MED ORDER — AMOXICILLIN-POT CLAVULANATE 875-125 MG PO TABS: 1.0000 | ORAL_TABLET | Freq: Two times a day (BID) | ORAL | 0 refills | Status: DC

## 2024-04-28 MED ORDER — PROMETHAZINE-DM 6.25-15 MG/5ML PO SYRP: 5.0000 mL | ORAL_SOLUTION | Freq: Four times a day (QID) | ORAL | 0 refills | Status: DC | PRN

## 2024-04-28 NOTE — Progress Notes (Signed)
 Established Patient Office Visit  Subjective   Patient ID: MANFRED LASPINA, male    DOB: [birth year]  Age: 78 y.o. MRN: [id number]  Chief Complaint  Patient presents with   URI    Sore throat, mucus with blood, cough and congestion for one week     HPI Discussed the use of AI scribe software for clinical note transcription with the patient, who gave verbal consent to proceed.  History of Present Illness   Kalmen Lollar is a 78 year old male who presents with sore throat, cough, and congestion.  Upper respiratory symptoms - Sore throat, cough, and congestion for the past week - Cough productive of small amounts of blood and black material, particularly in the mornings  Constitutional symptoms - Significant weakness and fatigue, especially in the legs - Reduced appetite - Unable to be physically active; often needs to rest after short walks - Attempting to regain strength by doubling vitamin intake and consuming Ensure, with minimal improvement  Hematologic malignancy history - History of lymphoma in remission for approximately two years - Persistent weakness and fatigue despite remission  Cardiovascular symptoms and history - History of cardiovascular disease, including a blockage in the right leg with prior graft placement - Currently taking blood pressure medication, recently adjusted due to morning dizziness - Allergic to ACE inhibitors and statins - Slightly elevated cholesterol levels  Tobacco use history - Quit smoking over forty years ago  Musculoskeletal symptoms - Awaiting further evaluation for a back issue, possibly related to a split in the spine      Patient Active Problem List   Diagnosis Date Noted   Bilateral leg weakness 04/05/2024   Anemia 04/05/2024   Left lower quadrant abdominal pain 02/04/2024   Paresthesia of right foot 11/20/2023   Insomnia 11/20/2023   Dental caries on pit and fissure surface penetrating into pulp 05/18/2023    Complete loss of teeth due to caries, class I 05/18/2023   Partial loss of teeth due to caries, class II 05/18/2023   Cough 05/18/2023   Laceration of right hand 04/09/2023   Encounter for well adult exam with abnormal findings 11/16/2022   Acute sinus infection 11/16/2022   Aneurysm of right popliteal artery (HCC) 05/20/2022   Popliteal artery aneurysm 04/21/2022   Mural thrombus of left ventricle 02/26/2022   PNA (pneumonia) 01/19/2022   Acute respiratory failure with hypoxia (HCC) 01/19/2022   Finger laceration 12/24/2021   High grade B-cell lymphoma (HCC) 12/03/2021   Abnormal CT scan, stomach    Actinic keratosis 10/09/2021   Aftercare following joint replacement 10/09/2021   Benign neoplasm of colon 10/09/2021   Deposits (accretions) on teeth 10/09/2021   Dizziness and giddiness 10/09/2021   Hypothyroidism 10/09/2021   Low back pain, unspecified 10/09/2021   Cough with hemoptysis 10/09/2021   Dizziness 10/09/2021   Right sided sciatica 10/09/2021   Statin intolerance 10/09/2021   Loss of weight 09/23/2021   Family history of pancreatic cancer 09/23/2021   Chronic anticoagulation 09/23/2021   Right leg paresthesias 10/02/2020   Erectile dysfunction 03/22/2019   Hyperglycemia 03/07/2018   Primary localized osteoarthritis of left knee 08/06/2017   Degenerative arthritis of left knee 08/05/2017   Osteoarthritis of left knee 08/05/2017   Right flank pain 09/29/2016   Dyspnea and respiratory abnormality 09/10/2016   Vitamin D  deficiency 05/11/2014   PLMD (periodic limb movement disorder) 02/02/2013   Encounter for therapeutic drug monitoring 12/06/2011   Joint pain 10/14/2011   Encounter for  long-term (current) use of other medications 12/25/2010   DYSPNEA 07/30/2009   Allergic rhinitis 10/31/2007   History of colonic polyps 10/31/2007   Hyperlipidemia 06/04/2007   Essential hypertension 06/04/2007   Coronary atherosclerosis 06/04/2007   Ischemic cardiomyopathy  06/04/2007   PENILE PROSTHESIS 06/04/2007   CHOLECYSTECTOMY, HX OF 06/04/2007   CORONARY ARTERY BYPASS GRAFT, HX OF 06/04/2007   ROS    Objective:     BP 130/79   Pulse 60   Ht 5' 11 (1.803 m)   Wt 197 lb (89.4 kg)   SpO2 92%   BMI 27.48 kg/m  BP Readings from Last 3 Encounters:  04/28/24 130/79  04/05/24 134/82  03/15/24 110/68   Wt Readings from Last 3 Encounters:  04/28/24 197 lb (89.4 kg)  04/05/24 202 lb (91.6 kg)  03/15/24 199 lb 12.8 oz (90.6 kg)     Physical Exam Vitals and nursing note reviewed.  Constitutional:      Appearance: Normal appearance.  HENT:     Head: Normocephalic.  Eyes:     Extraocular Movements: Extraocular movements intact.     Pupils: Pupils are equal, round, and reactive to light.  Cardiovascular:     Rate and Rhythm: Normal rate and regular rhythm.  Pulmonary:     Effort: Pulmonary effort is normal.     Breath sounds: Normal breath sounds.  Musculoskeletal:     Cervical back: Normal range of motion and neck supple.  Neurological:     Mental Status: He is alert and oriented to person, place, and time.  Psychiatric:        Mood and Affect: Mood normal.        Thought Content: Thought content normal.     No results found for any visits on 04/28/24.    The ASCVD Risk score (Arnett DK, et al., 2019) failed to calculate for the following reasons:   Risk score cannot be calculated because patient has a medical history suggesting prior/existing ASCVD    Assessment & Plan:   Problem List Items Addressed This Visit       Other   Bilateral leg weakness   Chronic weakness and fatigue post-lymphoma clearance. Possible spinal issues or effects from previous cancer treatment. Recent medication adjustments for hypertension. - Follow up with cardiologist next week. - Await contact from specialist regarding spinal issues.      Other Visit Diagnoses       Upper respiratory tract infection, unspecified type    -  Primary   -  Prescribe antibiotic. - Prescribe cough medicine. - Advise increased fluid intake.         No follow-ups on file.    Leita Longs, FNP

## 2024-05-01 NOTE — Assessment & Plan Note (Signed)
 Chronic weakness and fatigue post-lymphoma clearance. Possible spinal issues or effects from previous cancer treatment. Recent medication adjustments for hypertension. - Follow up with cardiologist next week. - Await contact from specialist regarding spinal issues.

## 2024-05-02 ENCOUNTER — Ambulatory Visit (INDEPENDENT_AMBULATORY_CARE_PROVIDER_SITE_OTHER)

## 2024-05-02 ENCOUNTER — Ambulatory Visit: Payer: Medicare HMO

## 2024-05-02 ENCOUNTER — Ambulatory Visit: Attending: Physician Assistant | Admitting: Physician Assistant

## 2024-05-02 VITALS — BP 128/70 | HR 62 | Ht 71.0 in | Wt 197.0 lb

## 2024-05-02 DIAGNOSIS — R42 Dizziness and giddiness: Secondary | ICD-10-CM

## 2024-05-02 DIAGNOSIS — I10 Essential (primary) hypertension: Secondary | ICD-10-CM | POA: Diagnosis not present

## 2024-05-02 DIAGNOSIS — Z95810 Presence of automatic (implantable) cardiac defibrillator: Secondary | ICD-10-CM

## 2024-05-02 DIAGNOSIS — R29898 Other symptoms and signs involving the musculoskeletal system: Secondary | ICD-10-CM | POA: Diagnosis not present

## 2024-05-02 DIAGNOSIS — I255 Ischemic cardiomyopathy: Secondary | ICD-10-CM | POA: Diagnosis not present

## 2024-05-02 DIAGNOSIS — E785 Hyperlipidemia, unspecified: Secondary | ICD-10-CM | POA: Diagnosis not present

## 2024-05-02 DIAGNOSIS — I5022 Chronic systolic (congestive) heart failure: Secondary | ICD-10-CM

## 2024-05-02 DIAGNOSIS — I25810 Atherosclerosis of coronary artery bypass graft(s) without angina pectoris: Secondary | ICD-10-CM | POA: Diagnosis not present

## 2024-05-02 DIAGNOSIS — I240 Acute coronary thrombosis not resulting in myocardial infarction: Secondary | ICD-10-CM

## 2024-05-02 LAB — CUP PACEART REMOTE DEVICE CHECK
Battery Remaining Longevity: 72 mo
Battery Voltage: 2.99 V
Brady Statistic AP VP Percent: 0.01 %
Brady Statistic AP VS Percent: 37.25 %
Brady Statistic AS VP Percent: 0.02 %
Brady Statistic AS VS Percent: 62.71 %
Brady Statistic RA Percent Paced: 36.53 %
Brady Statistic RV Percent Paced: 0.04 %
Date Time Interrogation Session: 20250923023325
HighPow Impedance: 71 Ohm
Implantable Lead Connection Status: 753985
Implantable Lead Connection Status: 753985
Implantable Lead Implant Date: 20210622
Implantable Lead Implant Date: 20210622
Implantable Lead Location: 753859
Implantable Lead Location: 753860
Implantable Lead Model: 5076
Implantable Pulse Generator Implant Date: 20210622
Lead Channel Impedance Value: 304 Ohm
Lead Channel Impedance Value: 361 Ohm
Lead Channel Impedance Value: 418 Ohm
Lead Channel Pacing Threshold Amplitude: 0.5 V
Lead Channel Pacing Threshold Amplitude: 0.75 V
Lead Channel Pacing Threshold Pulse Width: 0.4 ms
Lead Channel Pacing Threshold Pulse Width: 0.4 ms
Lead Channel Sensing Intrinsic Amplitude: 3.25 mV
Lead Channel Sensing Intrinsic Amplitude: 3.25 mV
Lead Channel Sensing Intrinsic Amplitude: 8.125 mV
Lead Channel Sensing Intrinsic Amplitude: 8.125 mV
Lead Channel Setting Pacing Amplitude: 1.5 V
Lead Channel Setting Pacing Amplitude: 2.5 V
Lead Channel Setting Pacing Pulse Width: 0.4 ms
Lead Channel Setting Sensing Sensitivity: 0.3 mV
Zone Setting Status: 755011

## 2024-05-02 NOTE — Progress Notes (Unsigned)
 Cardiology Office Note   Date:  05/02/2024  ID:  [name] [name], [name] [name] 10, 1947, MRN [id number] PCP: Norleen Lynwood ORN, MD  [facility] Providers Cardiologist:  Redell Shallow, MD Electrophysiologist:  Eulas FORBES Furbish, MD { Click to update primary MD,subspecialty MD or APP then REFRESH:1}    History of Present Illness [name] [name] is a 78 y.o. male with PMH of stage IV B-cell lymphoma, CAD s/p minimally invasive LIMA-LAD, ischemic cardiomyopathy, apical thrombus on Coumadin , hypertension, hyperlipidemia, history of [employer] dual-chamber ICD, aortic stenosis, and PAD s/p right above-knee popliteal to tibioperoneal trunk/posterior tibial artery bypass in October 2023.  He has history of CAD dating back to 62 when he had MI. He subsequently underwent a minimally invasive LIMA-LAD.  A cardiac MRI in July 2020 showed EF 30%, apical thrombus, mild aortic and mitral regurgitation.  He had ICD placed in June 2021.  Nuclear stress study in June 2022 showed EF 29% and the infarct but no ischemia.  Echocardiogram in June 2023 showed EF 30 to 35%, akinesis of the apex, anteroseptal and inferoseptal wall, moderate LAE, moderate AI, mild aortic stenosis.  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  Arteriogram in October 2023 showed large popliteal aneurysm on the right with occlusion of the distal peroneal artery.  He subsequently underwent right above-knee popliteal artery to tibioperoneal trunk/posterior tibial artery bypass was saphenous vein graft and ligation of the right popliteal artery aneurysm in October 2023.  Chest x-ray October 2024 showed left infrahilar density and chest CT was recommended.  Most recent echocardiogram obtained on 11/26/2023 showed EF 40 to 45%, small rounded echodensity measuring 0.39 x 0.46 cm along with a swirling pattern of contrast in the apex highly suspicious for LV thrombus, moderate concentric LVH, grade 1 DD, mid and distal  anterior septum, mid inferoseptal segment and apical anterior segment akinesis.  More recently, patient was seen by Dr. Furbish in June 2025 after episode of sustained VT.  He was asymptomatic.  VF zone was reprogrammed to a rate of 220.  Metoprolol  succinate was increased to 50 mg daily.  Recent lower extremity Doppler obtained on 01/18/2024 showed patent right popliteal to tibioperoneal trunk bypass graft without evidence of stenosis.   I last saw the patient on 03/15/2024 at which time he complained of fatigue, weakness and dizzy spell for the past several months.  Dizzy spell primarily occurs with body position changes.  I recommended increase fluid intake.  I suspect his weakness and fatigue may be related to low blood pressure and the recommended discontinuation of losartan .  I also split his metoprolol  succinate to 25 mg twice a day.  Given lack of any chest pain, I did not recommend any ischemic workup.  His lower extremity pulse was weak, however ABI and LEA demonstrated good blood flow.  He presents today for reassessment.  Since stopping the losartan  and splitting his metoprolol  succinate, his orthostatic dizziness has significantly improved.  He still have bilateral leg weakness.  He had a image at [facility] and was told he had lumbar issues.  He is waiting for MRI.  That was certainly explain why he would have bilateral leg weakness.  Overall, he is still doing well from the cardiac perspective and denies any chest pain or shortness of breath.  He is euvolemic on exam.  He can follow-up with Dr. Shallow in 6 months.  ROS: ***  Studies Reviewed      *** Risk Assessment/Calculations {  Does this patient have ATRIAL FIBRILLATION?:[id number]}         Physical Exam VS:  BP 128/70 (BP Location: Left Arm, Patient Position: Sitting, Cuff Size: Large)   Pulse 62   Ht 5' 11 (1.803 m)   Wt 197 lb (89.4 kg)   SpO2 95%   BMI 27.48 kg/m        Wt Readings from Last 3 Encounters:  05/02/24 197  lb (89.4 kg)  04/28/24 197 lb (89.4 kg)  04/05/24 202 lb (91.6 kg)    GEN: Well nourished, well developed in no acute distress NECK: No JVD; No carotid bruits CARDIAC: ***RRR, no murmurs, rubs, gallops RESPIRATORY:  Clear to auscultation without rales, wheezing or rhonchi  ABDOMEN: Soft, non-tender, non-distended EXTREMITIES:  No edema; No deformity   ASSESSMENT AND PLAN ***    {Are you ordering a CV Procedure (e.g. stress test, cath, DCCV, TEE, etc)?   Press F2        :[id number]}  Dispo: ***  Signed, [name], PA

## 2024-05-02 NOTE — Progress Notes (Signed)
 EPIC Encounter for ICM Monitoring  Patient Name: [name] [name] is a 78 y.o. male Date: 05/02/2024 Primary Care Physican: Norleen Lynwood ORN, MD Primary Cardiologist: Pietro Electrophysiologist: Mealor 04/20/2022 Not weighing at home 05/07/2022 Weight: 181 lbs (does not weigh consistently at home) 09/29/2022 Office Weight: 190 lbs 11/17/2022 Weight: 185 lbs 04/05/2023 Office Weight: 192 lbs       08/03/2023 Weight: 190 lbs   11/16/2023 Weight: 199 lbs (he reports is baseline)      01/18/2024 Office Weight: 200 lbs       03/13/2024 Weight:  192 lbs   03/15/2024 Office Weight: 199 lbs  04/21/2024 Weight: 203 lbs                          Transmission results reviewed.        DIET:  His wife had back surgery this past month, August and they ate more restaurant foods.      Optivol thoracic impedance suggesting normal fluid levels since 8/31.     Prescribed: Furosemide  20 mg take 2 tablet(s) (40 mg total) by mouth daily     Labs: 04/05/2024 Creatinine 1.36, BUN 22, Potassium 4.2, Sodium 139, GFR 49.67 02/28/2024 Creatinine 1.26, BUN 28, Potassium 4.1, Sodium 136 02/04/2024 Creatinine 1.37, BUN 32, Potassium 4.3, Sodium 141, GFR 49.29 12/31/2023 Creatinine 1.66, BUN 27, Potassium 4.3, Sodium 141, GFR 39.17  11/17/2023 Creatinine 1.52, BUN 28, Potassium 4.5, Sodium 140, GFR 43.58  11/16/2023 Creatinine 1.35, BUN 26, Potassium 4.3, Sodium 139, GFR 54 08/30/2023 Creatinine 1.35, BUN 32, Potassium 4.1, Sodium 135, GFR 54  A complete set of results can be found in Results Review.   Recommendations:  No changes.   Follow-up plan: ICM clinic phone appointment on 06/05/2024.   91 day device clinic remote transmission 08/01/2024.     EP/Cardiology Office Visits:  05/02/2024 with Hao Meng, PA.   Recall 09/22/2024 with Dr Pietro.   Recall 01/08/2025 with Dr Nancey.    Copy of ICM check sent to Dr. Nancey.    3 month ICM trend: 05/02/2024.    12-14 Month ICM trend:     Mitzie GORMAN Garner,  RN 05/02/2024 12:09 PM

## 2024-05-02 NOTE — Patient Instructions (Signed)
 Medication Instructions:   Your physician recommends that you continue on your current medications as directed. Please refer to the Current Medication list given to you today.  *If you need a refill on your cardiac medications before your next appointment, please call your pharmacy*    Follow-Up: At [facility], you and your health needs are our priority.  As part of our continuing mission to provide you with exceptional heart care, our providers are all part of one team.  This team includes your primary Cardiologist (physician) and Advanced Practice Providers or APPs (Physician Assistants and Nurse Practitioners) who all work together to provide you with the care you need, when you need it.  Your next appointment:   6 month(s)  Provider:   Redell Shallow, MD

## 2024-05-03 ENCOUNTER — Other Ambulatory Visit: Payer: Self-pay | Admitting: Internal Medicine

## 2024-05-03 NOTE — Progress Notes (Unsigned)
Remote ICD Transmission.

## 2024-05-03 NOTE — Telephone Encounter (Signed)
 Unfortunately getting to this message late. Patient has since followed up with family medicine and was given antibiotic and cough medication.

## 2024-05-03 NOTE — Progress Notes (Signed)
Remote ICD Transmission.

## 2024-05-09 DIAGNOSIS — E785 Hyperlipidemia, unspecified: Secondary | ICD-10-CM | POA: Diagnosis not present

## 2024-05-09 DIAGNOSIS — I4891 Unspecified atrial fibrillation: Secondary | ICD-10-CM | POA: Diagnosis not present

## 2024-05-09 DIAGNOSIS — E876 Hypokalemia: Secondary | ICD-10-CM | POA: Diagnosis not present

## 2024-05-09 DIAGNOSIS — D6869 Other thrombophilia: Secondary | ICD-10-CM | POA: Diagnosis not present

## 2024-05-09 DIAGNOSIS — I252 Old myocardial infarction: Secondary | ICD-10-CM | POA: Diagnosis not present

## 2024-05-09 DIAGNOSIS — H269 Unspecified cataract: Secondary | ICD-10-CM | POA: Diagnosis not present

## 2024-05-09 DIAGNOSIS — I509 Heart failure, unspecified: Secondary | ICD-10-CM | POA: Diagnosis not present

## 2024-05-09 DIAGNOSIS — R7303 Prediabetes: Secondary | ICD-10-CM | POA: Diagnosis not present

## 2024-05-09 DIAGNOSIS — Z7901 Long term (current) use of anticoagulants: Secondary | ICD-10-CM | POA: Diagnosis not present

## 2024-05-09 DIAGNOSIS — J309 Allergic rhinitis, unspecified: Secondary | ICD-10-CM | POA: Diagnosis not present

## 2024-05-09 DIAGNOSIS — I739 Peripheral vascular disease, unspecified: Secondary | ICD-10-CM | POA: Diagnosis not present

## 2024-05-09 DIAGNOSIS — N529 Male erectile dysfunction, unspecified: Secondary | ICD-10-CM | POA: Diagnosis not present

## 2024-05-09 DIAGNOSIS — I130 Hypertensive heart and chronic kidney disease with heart failure and stage 1 through stage 4 chronic kidney disease, or unspecified chronic kidney disease: Secondary | ICD-10-CM | POA: Diagnosis not present

## 2024-05-09 DIAGNOSIS — I700 Atherosclerosis of aorta: Secondary | ICD-10-CM | POA: Diagnosis not present

## 2024-05-09 DIAGNOSIS — I701 Atherosclerosis of renal artery: Secondary | ICD-10-CM | POA: Diagnosis not present

## 2024-05-09 DIAGNOSIS — I2510 Atherosclerotic heart disease of native coronary artery without angina pectoris: Secondary | ICD-10-CM | POA: Diagnosis not present

## 2024-05-09 DIAGNOSIS — I255 Ischemic cardiomyopathy: Secondary | ICD-10-CM | POA: Diagnosis not present

## 2024-05-09 DIAGNOSIS — M1990 Unspecified osteoarthritis, unspecified site: Secondary | ICD-10-CM | POA: Diagnosis not present

## 2024-05-09 DIAGNOSIS — N1831 Chronic kidney disease, stage 3a: Secondary | ICD-10-CM | POA: Diagnosis not present

## 2024-05-16 ENCOUNTER — Ambulatory Visit: Attending: Cardiology | Admitting: *Deleted

## 2024-05-16 ENCOUNTER — Ambulatory Visit: Payer: Self-pay | Admitting: Cardiovascular Disease

## 2024-05-16 DIAGNOSIS — I513 Intracardiac thrombosis, not elsewhere classified: Secondary | ICD-10-CM | POA: Diagnosis not present

## 2024-05-16 DIAGNOSIS — Z5181 Encounter for therapeutic drug level monitoring: Secondary | ICD-10-CM

## 2024-05-16 LAB — POCT INR: INR: 2.8 (ref 2.0–3.0)

## 2024-05-16 NOTE — Patient Instructions (Signed)
 Continue 1 tablet daily Continue greens Recheck in 4 wks

## 2024-05-16 NOTE — Progress Notes (Signed)
 INR-2.8 Please see anticoagulation encounter

## 2024-05-17 ENCOUNTER — Telehealth: Payer: Self-pay | Admitting: Cardiovascular Disease

## 2024-05-17 NOTE — Telephone Encounter (Signed)
 Patient needs to change the date of his remote device check. Please advise

## 2024-05-31 ENCOUNTER — Inpatient Hospital Stay: Attending: Hematology

## 2024-05-31 DIAGNOSIS — Z8572 Personal history of non-Hodgkin lymphomas: Secondary | ICD-10-CM | POA: Diagnosis present

## 2024-05-31 NOTE — Patient Instructions (Signed)
 [facility] [place] - A DEPT OF [facility]  Discharge Instructions: Thank you for choosing [facility] to provide your oncology and hematology care.  If you have a lab appointment with the Cancer Center - please note that after April 8th, 2024, all labs will be drawn in the cancer center.  You do not have to check in or register with the main entrance as you have in the past but will complete your check-in in the cancer center.  Wear comfortable clothing and clothing appropriate for easy access to any Portacath or PICC line.   We strive to give you quality time with your provider. You may need to reschedule your appointment if you arrive late (15 or more minutes).  Arriving late affects you and other patients whose appointments are after yours.  Also, if you miss three or more appointments without notifying the office, you may be dismissed from the clinic at the provider's discretion.      For prescription refill requests, have your pharmacy contact our office and allow 72 hours for refills to be completed.    Today you received the following chemotherapy and/or immunotherapy agents Port flush      To help prevent nausea and vomiting after your treatment, we encourage you to take your nausea medication as directed.  BELOW ARE SYMPTOMS THAT SHOULD BE REPORTED IMMEDIATELY: *FEVER GREATER THAN 100.4 F (38 C) OR HIGHER *CHILLS OR SWEATING *NAUSEA AND VOMITING THAT IS NOT CONTROLLED WITH YOUR NAUSEA MEDICATION *UNUSUAL SHORTNESS OF BREATH *UNUSUAL BRUISING OR BLEEDING *URINARY PROBLEMS (pain or burning when urinating, or frequent urination) *BOWEL PROBLEMS (unusual diarrhea, constipation, pain near the anus) TENDERNESS IN MOUTH AND THROAT WITH OR WITHOUT PRESENCE OF ULCERS (sore throat, sores in mouth, or a toothache) UNUSUAL RASH, SWELLING OR PAIN  UNUSUAL VAGINAL DISCHARGE OR ITCHING   Items with * indicate a potential emergency and should be followed  up as soon as possible or go to the Emergency Department if any problems should occur.  Please show the CHEMOTHERAPY ALERT CARD or IMMUNOTHERAPY ALERT CARD at check-in to the Emergency Department and triage nurse.  Should you have questions after your visit or need to cancel or reschedule your appointment, please contact [facility] [place] - A DEPT OF [facility] [phone]  and follow the prompts.  Office hours are 8:00 a.m. to 4:30 p.m. Monday - Friday. Please note that voicemails left after 4:00 p.m. may not be returned until the following business day.  We are closed weekends and major holidays. You have access to a nurse at all times for urgent questions. Please call the main number to the clinic [phone] and follow the prompts.  For any non-urgent questions, you may also contact your provider using MyChart. We now offer e-Visits for anyone 86 and older to request care online for non-urgent symptoms. For details visit [email or web address].   Also download the MyChart app! Go to the app store, search "MyChart", open the app, select [place], and log in with your MyChart username and password.

## 2024-05-31 NOTE — Progress Notes (Signed)
 Patients port flushed without difficulty.  Good blood return noted with no bruising or swelling noted at site.  Band aid applied.  VSS with discharge and left in satisfactory condition with no s/s of distress noted.

## 2024-06-02 ENCOUNTER — Other Ambulatory Visit (HOSPITAL_COMMUNITY): Payer: Self-pay | Admitting: Neurosurgery

## 2024-06-02 DIAGNOSIS — M47812 Spondylosis without myelopathy or radiculopathy, cervical region: Secondary | ICD-10-CM

## 2024-06-02 DIAGNOSIS — R29818 Other symptoms and signs involving the nervous system: Secondary | ICD-10-CM

## 2024-06-05 ENCOUNTER — Ambulatory Visit: Attending: Cardiovascular Disease

## 2024-06-05 DIAGNOSIS — I5022 Chronic systolic (congestive) heart failure: Secondary | ICD-10-CM

## 2024-06-05 DIAGNOSIS — Z95810 Presence of automatic (implantable) cardiac defibrillator: Secondary | ICD-10-CM

## 2024-06-08 NOTE — Progress Notes (Signed)
 EPIC Encounter for ICM Monitoring  Patient Name: [name] [name] is a 78 y.o. male Date: 06/08/2024 Primary Care Physican: Norleen Lynwood ORN, MD Primary Cardiologist: Pietro Electrophysiologist: Mealor 04/20/2022 Not weighing at home 05/07/2022 Weight: 181 lbs (does not weigh consistently at home) 09/29/2022 Office Weight: 190 lbs 11/17/2022 Weight: 185 lbs 04/05/2023 Office Weight: 192 lbs       08/03/2023 Weight: 190 lbs   11/16/2023 Weight: 199 lbs (he reports is baseline)      01/18/2024 Office Weight: 200 lbs       03/13/2024 Weight:  192 lbs   03/15/2024 Office Weight: 199 lbs  04/21/2024 Weight: 203 lbs                          Spoke with patient and heart failure questions reviewed.  Transmission results reviewed.  Pt asymptomatic for fluid accumulation.  He continues to have weakness in his legs and having an MRI in December.   Pt will be on cruise from 12/17 - 12/29.    DIET:  No updates   Since 05/02/2024 ICM Remote Transmission: Optivol thoracic impedance suggesting normal fluid levels.     Prescribed: Furosemide  20 mg take 2 tablet(s) (40 mg total) by mouth daily     Labs: 04/05/2024 Creatinine 1.36, BUN 22, Potassium 4.2, Sodium 139, GFR 49.67 02/28/2024 Creatinine 1.26, BUN 28, Potassium 4.1, Sodium 136 02/04/2024 Creatinine 1.37, BUN 32, Potassium 4.3, Sodium 141, GFR 49.29 12/31/2023 Creatinine 1.66, BUN 27, Potassium 4.3, Sodium 141, GFR 39.17  11/17/2023 Creatinine 1.52, BUN 28, Potassium 4.5, Sodium 140, GFR 43.58  11/16/2023 Creatinine 1.35, BUN 26, Potassium 4.3, Sodium 139, GFR 54 08/30/2023 Creatinine 1.35, BUN 32, Potassium 4.1, Sodium 135, GFR 54  A complete set of results can be found in Results Review.   Recommendations:  No changes and encouraged to call if experiencing any fluid symptoms.   Follow-up plan: ICM clinic phone appointment on 07/10/2024.   91 day device clinic remote transmission 08/01/2024.     EP/Cardiology Office Visits:    Recall 10/29/2024 with  Dr Pietro (6 month).   Recall 01/08/2025 with Dr Nancey.    Copy of ICM check sent to Dr. Nancey.   Remote monitoring is medically necessary for Heart Failure Management.    Daily Thoracic Impedance ICM trend: 03/06/2024 through 06/05/2024.    12-14 Month Thoracic Impedance ICM trend:     Mitzie GORMAN Garner, RN 06/08/2024 8:19 AM

## 2024-06-12 ENCOUNTER — Encounter: Payer: Self-pay | Admitting: Radiology

## 2024-06-13 ENCOUNTER — Ambulatory Visit: Attending: Cardiology | Admitting: *Deleted

## 2024-06-13 DIAGNOSIS — Z5181 Encounter for therapeutic drug level monitoring: Secondary | ICD-10-CM

## 2024-06-13 DIAGNOSIS — I513 Intracardiac thrombosis, not elsewhere classified: Secondary | ICD-10-CM

## 2024-06-13 LAB — POCT INR: INR: 2 (ref 2.0–3.0)

## 2024-06-13 NOTE — Patient Instructions (Signed)
 Continue 1 tablet daily Continue greens Recheck in 6 wks

## 2024-06-13 NOTE — Progress Notes (Signed)
 INR 2.0 Please see anticoagulation encounter.

## 2024-07-10 ENCOUNTER — Ambulatory Visit: Attending: Cardiovascular Disease

## 2024-07-10 DIAGNOSIS — I5022 Chronic systolic (congestive) heart failure: Secondary | ICD-10-CM | POA: Diagnosis not present

## 2024-07-10 DIAGNOSIS — Z95810 Presence of automatic (implantable) cardiac defibrillator: Secondary | ICD-10-CM

## 2024-07-10 NOTE — Progress Notes (Signed)
 EPIC Encounter for ICM Monitoring  Patient Name: [name] [name] is a 78 y.o. male Date: 07/10/2024 Primary Care Physican: Norleen Lynwood ORN, MD Primary Cardiologist: Pietro Electrophysiologist: Mealor 04/20/2022 Not weighing at home 05/07/2022 Weight: 181 lbs (does not weigh consistently at home) 09/29/2022 Office Weight: 190 lbs 11/17/2022 Weight: 185 lbs 04/05/2023 Office Weight: 192 lbs       08/03/2023 Weight: 190 lbs   11/16/2023 Weight: 199 lbs (he reports is baseline)      01/18/2024 Office Weight: 200 lbs       03/13/2024 Weight:  192 lbs   03/15/2024 Office Weight: 199 lbs  04/21/2024 Weight: 203 lbs   07/10/2024 Weight: 202 lbs                        Spoke with patient and heart failure questions reviewed.  Transmission results reviewed.  Pt asymptomatic for fluid accumulation.  Reports feeling well at this time and voices no complaints.  He had Thanksgiving food from restaurant which is typically high in salt.   Pt will be on cruise from 12/17 - 12/29.    DIET:  No updates   Since 06/05/2024 ICM Remote Transmission: Optivol thoracic impedance suggesting normal fluid levels with the exception of possible fluid accumulation starting 07/05/2024.   Prescribed: Furosemide  20 mg take 2 tablet(s) (40 mg total) by mouth daily     Labs: 04/05/2024 Creatinine 1.36, BUN 22, Potassium 4.2, Sodium 139, GFR 49.67 02/28/2024 Creatinine 1.26, BUN 28, Potassium 4.1, Sodium 136 02/04/2024 Creatinine 1.37, BUN 32, Potassium 4.3, Sodium 141, GFR 49.29 12/31/2023 Creatinine 1.66, BUN 27, Potassium 4.3, Sodium 141, GFR 39.17  11/17/2023 Creatinine 1.52, BUN 28, Potassium 4.5, Sodium 140, GFR 43.58  11/16/2023 Creatinine 1.35, BUN 26, Potassium 4.3, Sodium 139, GFR 54 08/30/2023 Creatinine 1.35, BUN 32, Potassium 4.1, Sodium 135, GFR 54  A complete set of results can be found in Results Review.   Recommendations:  Advised to limit salt intake and avoid restaurant foods over the next week.  Also  encouraged to call if he experiences any fluid symptoms when he returns from December cruise.    Follow-up plan: ICM clinic phone appointment on 08/21/2024.   91 day device clinic remote transmission 08/01/2024.     EP/Cardiology Office Visits:    Recall 10/29/2024 with Dr Pietro (6 month).   Recall 01/08/2025 with Dr Nancey.    Copy of ICM check sent to Dr. Nancey.     Remote monitoring is medically necessary for Heart Failure Management.    Daily Thoracic Impedance ICM trend: 04/10/2024 through 07/10/2024.    12-14 Month Thoracic Impedance ICM trend:     Mitzie GORMAN Garner, RN 07/10/2024 10:03 AM

## 2024-07-13 NOTE — CV Procedure (Signed)
  Device system confirmed to be MRI conditional, with implant date > 6 weeks ago, and no evidence of abandoned or epicardial leads in review of most recent CXR  Device last cleared by EP Provider: Daphne Barrack 07/13/24  Clearance is good through for 1 year as long as parameters remain stable at time of check. If pt undergoes a cardiac device procedure during that time, they should be re-cleared.   Tachy-therapies to be programmed off if applicable with device back to pre-MRI settings after completion of exam.  [employer] - Programming recommendation received through [employer] App/Tablet  Rocky Catalan, RT  07/13/2024 3:46 PM

## 2024-07-18 ENCOUNTER — Ambulatory Visit (HOSPITAL_COMMUNITY)
Admission: RE | Admit: 2024-07-18 | Discharge: 2024-07-18 | Disposition: A | Source: Ambulatory Visit | Attending: Neurosurgery | Admitting: Neurosurgery

## 2024-07-18 ENCOUNTER — Other Ambulatory Visit (HOSPITAL_COMMUNITY)

## 2024-07-18 DIAGNOSIS — R29818 Other symptoms and signs involving the nervous system: Secondary | ICD-10-CM

## 2024-07-18 DIAGNOSIS — M47812 Spondylosis without myelopathy or radiculopathy, cervical region: Secondary | ICD-10-CM

## 2024-07-18 NOTE — Progress Notes (Incomplete)
 Patient was monitored by this RN during MRI scan due to presence of a pacemaker. Cardiac rhythm was continuously monitored throughout the procedure. Prior to the start of the scan, the pacemaker was placed in MRI-safe mode by the MRI technician and/or pacemaker representative. Following the completion of the scan, the device was returned to its pre-MRI settings. Neurological status and orientation post-procedure were unchanged from baseline.   Pre-procedure Heart Rate (Prior to being placed in MRI safe mode): 60 Intra: 85 Post-procedure Heart Rate (Once pacemaker is returned to baseline mode):

## 2024-07-25 ENCOUNTER — Ambulatory Visit: Attending: Cardiology | Admitting: *Deleted

## 2024-07-25 DIAGNOSIS — I513 Intracardiac thrombosis, not elsewhere classified: Secondary | ICD-10-CM

## 2024-07-25 DIAGNOSIS — Z5181 Encounter for therapeutic drug level monitoring: Secondary | ICD-10-CM

## 2024-07-25 LAB — POCT INR: INR: 4.4 — AB (ref 2.0–3.0)

## 2024-07-25 NOTE — Patient Instructions (Signed)
 Hold warfarin tonight, take 1/2 tablet tomorrow night then resume 1 tablet daily Continue greens Recheck in 3 wks

## 2024-07-25 NOTE — Progress Notes (Signed)
 INR 4.4 Please see anticoagulation encounter

## 2024-07-28 ENCOUNTER — Encounter: Payer: Self-pay | Admitting: Cardiology

## 2024-08-08 ENCOUNTER — Ambulatory Visit: Attending: Cardiovascular Disease

## 2024-08-08 DIAGNOSIS — I5022 Chronic systolic (congestive) heart failure: Secondary | ICD-10-CM

## 2024-08-09 ENCOUNTER — Ambulatory Visit: Payer: Self-pay | Admitting: Cardiovascular Disease

## 2024-08-09 LAB — CUP PACEART REMOTE DEVICE CHECK
Battery Remaining Longevity: 67 mo
Battery Voltage: 2.99 V
Brady Statistic AP VP Percent: 0.03 %
Brady Statistic AP VS Percent: 40.99 %
Brady Statistic AS VP Percent: 0.05 %
Brady Statistic AS VS Percent: 58.94 %
Brady Statistic RA Percent Paced: 39.89 %
Brady Statistic RV Percent Paced: 0.07 %
Date Time Interrogation Session: 20251230033525
HighPow Impedance: 76 Ohm
Implantable Lead Connection Status: 753985
Implantable Lead Connection Status: 753985
Implantable Lead Implant Date: 20210622
Implantable Lead Implant Date: 20210622
Implantable Lead Location: 753859
Implantable Lead Location: 753860
Implantable Lead Model: 5076
Implantable Pulse Generator Implant Date: 20210622
Lead Channel Impedance Value: 304 Ohm
Lead Channel Impedance Value: 399 Ohm
Lead Channel Impedance Value: 456 Ohm
Lead Channel Pacing Threshold Amplitude: 0.5 V
Lead Channel Pacing Threshold Amplitude: 0.75 V
Lead Channel Pacing Threshold Pulse Width: 0.4 ms
Lead Channel Pacing Threshold Pulse Width: 0.4 ms
Lead Channel Sensing Intrinsic Amplitude: 4.375 mV
Lead Channel Sensing Intrinsic Amplitude: 4.375 mV
Lead Channel Sensing Intrinsic Amplitude: 9 mV
Lead Channel Sensing Intrinsic Amplitude: 9 mV
Lead Channel Setting Pacing Amplitude: 1.5 V
Lead Channel Setting Pacing Amplitude: 2.5 V
Lead Channel Setting Pacing Pulse Width: 0.4 ms
Lead Channel Setting Sensing Sensitivity: 0.3 mV
Zone Setting Status: 755011

## 2024-08-14 ENCOUNTER — Ambulatory Visit

## 2024-08-14 ENCOUNTER — Other Ambulatory Visit: Payer: Self-pay | Admitting: Cardiology

## 2024-08-14 NOTE — Progress Notes (Signed)
 Remote ICD Transmission

## 2024-08-16 ENCOUNTER — Ambulatory Visit: Admitting: *Deleted

## 2024-08-16 DIAGNOSIS — Z5181 Encounter for therapeutic drug level monitoring: Secondary | ICD-10-CM | POA: Diagnosis not present

## 2024-08-16 DIAGNOSIS — I513 Intracardiac thrombosis, not elsewhere classified: Secondary | ICD-10-CM

## 2024-08-16 LAB — POCT INR: INR: 1.8 — AB (ref 2.0–3.0)

## 2024-08-16 NOTE — Progress Notes (Signed)
 INR 1.8. Please see anticoagulation encounter

## 2024-08-16 NOTE — Patient Instructions (Signed)
 Continue warfarin 1 tablet daily  (pt held warfarin 8 days while on cruise for nose bleed) Continue greens Recheck in 4 wks

## 2024-08-21 ENCOUNTER — Ambulatory Visit: Attending: Cardiovascular Disease

## 2024-08-21 DIAGNOSIS — Z95810 Presence of automatic (implantable) cardiac defibrillator: Secondary | ICD-10-CM | POA: Diagnosis not present

## 2024-08-21 DIAGNOSIS — I5022 Chronic systolic (congestive) heart failure: Secondary | ICD-10-CM | POA: Diagnosis not present

## 2024-08-23 NOTE — Progress Notes (Signed)
 EPIC Encounter for ICM Monitoring  Patient Name: [name] [name] is a 79 y.o. male Date: 08/23/2024 Primary Care Physican: Norleen Lynwood ORN, MD Primary Cardiologist: Pietro Electrophysiologist: Mealor 04/20/2022 Not weighing at home 05/07/2022 Weight: 181 lbs (does not weigh consistently at home) 09/29/2022 Office Weight: 190 lbs 11/17/2022 Weight: 185 lbs 04/05/2023 Office Weight: 192 lbs       08/03/2023 Weight: 190 lbs   11/16/2023 Weight: 199 lbs (he reports is baseline)      01/18/2024 Office Weight: 200 lbs       03/13/2024 Weight:  192 lbs   03/15/2024 Office Weight: 199 lbs  04/21/2024 Weight: 203 lbs   07/10/2024 Weight: 202 lbs   1/14/20256 Weight: 196 lbs                      Spoke with patient and heart failure questions reviewed.  Transmission results reviewed.  Pt asymptomatic for fluid accumulation.  Reports feeling well at this time and voices no complaints.  Wife fell after getting home from the cruise and surgery on her knee.     DIET:  No updates   Since 07/10/2024 ICM Remote Transmission: Optivol thoracic impedance suggesting normal fluid levels with the exception of possible fluid accumulation starting 07/26/2024-08/09/2024 (during cruise).   Prescribed: Furosemide  20 mg take 2 tablet(s) (40 mg total) by mouth daily     Labs: 04/05/2024 Creatinine 1.36, BUN 22, Potassium 4.2, Sodium 139, GFR 49.67 02/28/2024 Creatinine 1.26, BUN 28, Potassium 4.1, Sodium 136 02/04/2024 Creatinine 1.37, BUN 32, Potassium 4.3, Sodium 141, GFR 49.29 12/31/2023 Creatinine 1.66, BUN 27, Potassium 4.3, Sodium 141, GFR 39.17  11/17/2023 Creatinine 1.52, BUN 28, Potassium 4.5, Sodium 140, GFR 43.58  11/16/2023 Creatinine 1.35, BUN 26, Potassium 4.3, Sodium 139, GFR 54 08/30/2023 Creatinine 1.35, BUN 32, Potassium 4.1, Sodium 135, GFR 54  A complete set of results can be found in Results Review.   Recommendations:  No changes and encouraged to call if experiencing any fluid symptoms.    Follow-up plan: ICM clinic phone appointment on 09/21/2024.   91 day device clinic remote transmission 11/07/2024.     EP/Cardiology Office Visits:    11/03/2024 with Dr Pietro (6 month).   Recall 01/08/2025 with Dr Nancey.    Copy of ICM check sent to Dr. Nancey.    Remote monitoring is medically necessary for Heart Failure Management.    Daily Thoracic Impedance ICM trend: 05/22/2024 through 08/21/2024.    12-14 Month Thoracic Impedance ICM trend:     Mitzie GORMAN Garner, RN 08/23/2024 9:51 AM

## 2024-08-24 ENCOUNTER — Inpatient Hospital Stay: Attending: Hematology

## 2024-08-24 VITALS — BP 123/78 | HR 62 | Temp 97.1°F | Resp 18

## 2024-08-24 DIAGNOSIS — C8510 Unspecified B-cell lymphoma, unspecified site: Secondary | ICD-10-CM

## 2024-08-24 DIAGNOSIS — I255 Ischemic cardiomyopathy: Secondary | ICD-10-CM | POA: Diagnosis not present

## 2024-08-24 DIAGNOSIS — R1024 Suprapubic pain: Secondary | ICD-10-CM | POA: Insufficient documentation

## 2024-08-24 DIAGNOSIS — I509 Heart failure, unspecified: Secondary | ICD-10-CM | POA: Insufficient documentation

## 2024-08-24 DIAGNOSIS — Z7901 Long term (current) use of anticoagulants: Secondary | ICD-10-CM | POA: Diagnosis not present

## 2024-08-24 DIAGNOSIS — Z9221 Personal history of antineoplastic chemotherapy: Secondary | ICD-10-CM | POA: Insufficient documentation

## 2024-08-24 DIAGNOSIS — D696 Thrombocytopenia, unspecified: Secondary | ICD-10-CM | POA: Diagnosis not present

## 2024-08-24 DIAGNOSIS — Z800 Family history of malignant neoplasm of digestive organs: Secondary | ICD-10-CM | POA: Insufficient documentation

## 2024-08-24 DIAGNOSIS — R7989 Other specified abnormal findings of blood chemistry: Secondary | ICD-10-CM | POA: Insufficient documentation

## 2024-08-24 DIAGNOSIS — R1032 Left lower quadrant pain: Secondary | ICD-10-CM | POA: Insufficient documentation

## 2024-08-24 DIAGNOSIS — Z8572 Personal history of non-Hodgkin lymphomas: Secondary | ICD-10-CM | POA: Insufficient documentation

## 2024-08-24 LAB — COMPREHENSIVE METABOLIC PANEL WITH GFR
ALT: 18 U/L (ref 0–44)
AST: 29 U/L (ref 15–41)
Albumin: 4.3 g/dL (ref 3.5–5.0)
Alkaline Phosphatase: 59 U/L (ref 38–126)
Anion gap: 14 (ref 5–15)
BUN: 20 mg/dL (ref 8–23)
CO2: 25 mmol/L (ref 22–32)
Calcium: 9.2 mg/dL (ref 8.9–10.3)
Chloride: 101 mmol/L (ref 98–111)
Creatinine, Ser: 1.26 mg/dL — ABNORMAL HIGH (ref 0.61–1.24)
GFR, Estimated: 58 mL/min — ABNORMAL LOW
Glucose, Bld: 107 mg/dL — ABNORMAL HIGH (ref 70–99)
Potassium: 4 mmol/L (ref 3.5–5.1)
Sodium: 139 mmol/L (ref 135–145)
Total Bilirubin: 0.9 mg/dL (ref 0.0–1.2)
Total Protein: 6.9 g/dL (ref 6.5–8.1)

## 2024-08-24 LAB — CBC WITH DIFFERENTIAL/PLATELET
Abs Immature Granulocytes: 0 K/uL (ref 0.00–0.07)
Basophils Absolute: 0 K/uL (ref 0.0–0.1)
Basophils Relative: 1 %
Eosinophils Absolute: 0.1 K/uL (ref 0.0–0.5)
Eosinophils Relative: 2 %
HCT: 43.6 % (ref 39.0–52.0)
Hemoglobin: 14.4 g/dL (ref 13.0–17.0)
Immature Granulocytes: 0 %
Lymphocytes Relative: 40 %
Lymphs Abs: 2.1 K/uL (ref 0.7–4.0)
MCH: 31.5 pg (ref 26.0–34.0)
MCHC: 33 g/dL (ref 30.0–36.0)
MCV: 95.4 fL (ref 80.0–100.0)
Monocytes Absolute: 0.4 K/uL (ref 0.1–1.0)
Monocytes Relative: 8 %
Neutro Abs: 2.6 K/uL (ref 1.7–7.7)
Neutrophils Relative %: 49 %
Platelets: 118 K/uL — ABNORMAL LOW (ref 150–400)
RBC: 4.57 MIL/uL (ref 4.22–5.81)
RDW: 13.3 % (ref 11.5–15.5)
WBC: 5.4 K/uL (ref 4.0–10.5)
nRBC: 0 % (ref 0.0–0.2)

## 2024-08-24 LAB — FERRITIN: Ferritin: 229 ng/mL (ref 24–336)

## 2024-08-24 LAB — IRON AND TIBC
Iron: 104 ug/dL (ref 45–182)
Saturation Ratios: 30 % (ref 17.9–39.5)
TIBC: 343 ug/dL (ref 250–450)
UIBC: 239 ug/dL

## 2024-08-24 LAB — LACTATE DEHYDROGENASE: LDH: 143 U/L (ref 105–235)

## 2024-08-24 NOTE — Patient Instructions (Signed)
 [facility] - A DEPT OF [place]  Discharge Instructions: Thank you for choosing [facility] to provide your oncology and hematology care.  If you have a lab appointment with the Cancer Center - please note that after April 8th, 2024, all labs will be drawn in the cancer center.  You do not have to check in or register with the main entrance as you have in the past but will complete your check-in in the cancer center.  Wear comfortable clothing and clothing appropriate for easy access to any Portacath or PICC line.   We strive to give you quality time with your provider. You may need to reschedule your appointment if you arrive late (15 or more minutes).  Arriving late affects you and other patients whose appointments are after yours.  Also, if you miss three or more appointments without notifying the office, you may be dismissed from the clinic at the providers discretion.      For prescription refill requests, have your pharmacy contact our office and allow 72 hours for refills to be completed.    Today you received the following Port flushed with lab draw return as scheduled.   To help prevent nausea and vomiting after your treatment, we encourage you to take your nausea medication as directed.  BELOW ARE SYMPTOMS THAT SHOULD BE REPORTED IMMEDIATELY: *FEVER GREATER THAN 100.4 F (38 C) OR HIGHER *CHILLS OR SWEATING *NAUSEA AND VOMITING THAT IS NOT CONTROLLED WITH YOUR NAUSEA MEDICATION *UNUSUAL SHORTNESS OF BREATH *UNUSUAL BRUISING OR BLEEDING *URINARY PROBLEMS (pain or burning when urinating, or frequent urination) *BOWEL PROBLEMS (unusual diarrhea, constipation, pain near the anus) TENDERNESS IN MOUTH AND THROAT WITH OR WITHOUT PRESENCE OF ULCERS (sore throat, sores in mouth, or a toothache) UNUSUAL RASH, SWELLING OR PAIN  UNUSUAL VAGINAL DISCHARGE OR ITCHING   Items with * indicate a potential emergency and should be followed up as  soon as possible or go to the Emergency Department if any problems should occur.  Please show the CHEMOTHERAPY ALERT CARD or IMMUNOTHERAPY ALERT CARD at check-in to the Emergency Department and triage nurse.  Should you have questions after your visit or need to cancel or reschedule your appointment, please contact [facility]  - A DEPT OF [facility] [phone]  and follow the prompts.  Office hours are 8:00 a.m. to 4:30 p.m. Monday - Friday. Please note that voicemails left after 4:00 p.m. may not be returned until the following business day.  We are closed weekends and major holidays. You have access to a nurse at all times for urgent questions. Please call the main number to the clinic [phone] and follow the prompts.  For any non-urgent questions, you may also contact your provider using MyChart. We now offer e-Visits for anyone 14 and older to request care online for non-urgent symptoms. For details visit [email or web address].   Also download the MyChart app! Go to the app store, search MyChart, open the app, select [facility] [place], and log in with your MyChart username and password.

## 2024-08-24 NOTE — Progress Notes (Signed)
 Port flushed with good blood return noted. No bruising or swelling at site. Bandaid applied and patient discharged in satisfactory condition. VVS stable with no signs or symptoms of distressed noted.

## 2024-08-30 ENCOUNTER — Ambulatory Visit

## 2024-08-30 VITALS — BP 148/63 | HR 70 | Ht 71.0 in | Wt 196.0 lb

## 2024-08-30 DIAGNOSIS — R7303 Prediabetes: Secondary | ICD-10-CM | POA: Insufficient documentation

## 2024-08-30 DIAGNOSIS — Z95810 Presence of automatic (implantable) cardiac defibrillator: Secondary | ICD-10-CM | POA: Insufficient documentation

## 2024-08-30 DIAGNOSIS — R29898 Other symptoms and signs involving the musculoskeletal system: Secondary | ICD-10-CM | POA: Diagnosis not present

## 2024-08-30 DIAGNOSIS — Z7409 Other reduced mobility: Secondary | ICD-10-CM

## 2024-08-30 NOTE — Progress Notes (Signed)
 "  Established Patient Office Visit  Subjective   Patient ID: [name] [name], male    DOB: [birth year]  Age: 79 y.o. MRN: [id number]  Chief Complaint  Patient presents with   Establish Care    Pt has a few concerns, weakness in legs for about 2 years now     HPI Discussed the use of AI scribe software for clinical note transcription with the patient, who gave verbal consent to proceed.  History of Present Illness    [name] [name] is a 79 year old male with lymphoma and congestive heart failure who presents with weakness in the legs.  Lower extremity weakness - Progressive weakness in both legs, worsening over time - Limits ability to work or walk long distances - Requires wheelchair assistance for long distances, such as at the airport - No definitive diagnosis after evaluation by cardiology and neurology specialists - No recent tick bites  Peripheral neuropathy symptoms - Numbness in the toes of the right foot - Advised to perform stretches, which provide only temporary relief  Lymphoma history - History of lymphoma, currently in remission - No longer receiving infusions - Undergoes regular blood work - Has a port for blood draws  Congestive heart failure - History of congestive heart failure - Takes warfarin for anticoagulation  Vascular and surgical history - History of aneurysm in the back, status post bypass surgery with vein harvested from leg - No vascular surgeries on the legs - History of knee replacement surgery - Recent ultrasounds of blood flow were normal  Pulmonary symptoms and history - History of pneumonia - Remains vigilant for symptoms of pneumonia recurrence - Occasionally expels thick, clear saliva, which he associates with prior pneumonia episodes  Tobacco use - Former smoker, quit over forty years ago     Patient Active Problem List   Diagnosis Date Noted   Elevated serum creatinine 08/31/2024   Left lower quadrant abdominal  pain 02/04/2024   High grade B-cell lymphoma (HCC) 12/03/2021   Family history of pancreatic cancer 09/23/2021      ROS    Objective:     BP (!) 148/63   Pulse 70   Ht 5' 11 (1.803 m)   Wt 196 lb (88.9 kg)   SpO2 94%   BMI 27.34 kg/m  BP Readings from Last 3 Encounters:  09/06/24 136/83  08/30/24 (!) 148/63  08/24/24 123/78   Wt Readings from Last 3 Encounters:  09/06/24 196 lb 6.9 oz (89.1 kg)  08/30/24 196 lb (88.9 kg)  05/31/24 198 lb 13.7 oz (90.2 kg)     Physical Exam Vitals and nursing note reviewed.  Constitutional:      Appearance: Normal appearance.  HENT:     Head: Normocephalic.  Eyes:     Extraocular Movements: Extraocular movements intact.     Pupils: Pupils are equal, round, and reactive to light.  Cardiovascular:     Rate and Rhythm: Normal rate and regular rhythm.  Pulmonary:     Effort: Pulmonary effort is normal.     Breath sounds: Normal breath sounds.  Musculoskeletal:     Cervical back: Normal range of motion and neck supple.  Neurological:     Mental Status: He is alert and oriented to person, place, and time.  Psychiatric:        Mood and Affect: Mood normal.        Thought Content: Thought content normal.      No results found for any visits  on 08/30/24.    The ASCVD Risk score (Arnett DK, et al., 2019) failed to calculate for the following reasons:   Risk score cannot be calculated because patient has a medical history suggesting prior/existing ASCVD   * - Cholesterol units were assumed    Assessment & Plan:   Problem List Items Addressed This Visit       Other   RESOLVED: Bilateral leg weakness - Primary   Relevant Orders   Ambulatory referral to Occupational Therapy   RESOLVED: Declining mobility   Relevant Orders   Ambulatory referral to Occupational Therapy   Assessment and Plan     Bilateral leg weakness Chronic leg weakness with unclear etiology despite cardiology and orthopedic evaluations. Vascular  studies normal. Discontinued rosuvastatin  without improvement. Requires mobility assistance. - Investigate process for obtaining a motorized scooter through Medicare. - Monitor leg weakness and mobility.  History of non-Hodgkin lymphoma Lymphoma in remission. No current cancer activity. - Continue regular blood draws for monitoring.      No follow-ups on file.    [name] Longs, FNP  "

## 2024-08-31 ENCOUNTER — Inpatient Hospital Stay: Admitting: Oncology

## 2024-08-31 DIAGNOSIS — R7989 Other specified abnormal findings of blood chemistry: Secondary | ICD-10-CM | POA: Insufficient documentation

## 2024-08-31 NOTE — Assessment & Plan Note (Addendum)
 Elevated creatinine levels over the past few lab draws. Most recent creatinine 1.26 (1.36) with decreased GFR. He is currently not followed by a kidney specialist although he sees his PCP regularly.

## 2024-08-31 NOTE — Assessment & Plan Note (Addendum)
-   He does not have any fevers or weight loss.  He has occasional night sweats once or twice a week which are stable for more than a year. - Physical exam: No palpable adenopathy or splenomegaly. - Labs from 08/24/2024: Normal LFTs.  LDH is normal.  CBC grossly normal with stable mild thrombocytopenia.  Hemoglobin is slightly lower at 12.9.  Will repeat in 6 months along with ferritin and iron panel.  RTC 6 months for follow-up.

## 2024-08-31 NOTE — Assessment & Plan Note (Addendum)
-   He does not have any fevers or weight loss.  He has occasional night sweats once or twice a week which are stable for more than a year. - Physical exam: No palpable adenopathy or splenomegaly. - Labs from 08/24/2024: Normal LFTs.  LDH is normal.  CBC grossly normal with stable mild thrombocytopenia.  Hemoglobin has improved some from previous and is now 12.9.  Iron levels and ferritin are within normal range.  RTC 6 months for follow-up.

## 2024-08-31 NOTE — Progress Notes (Unsigned)
 "  [facility] OFFICE PROGRESS NOTE  Bevely Doffing, FNP  ASSESSMENT & PLAN:    Assessment & Plan High grade B-cell lymphoma (HCC) - He does not have any fevers or weight loss.  He has occasional night sweats once or twice a week which are stable for more than a year. - Physical exam: No palpable adenopathy or splenomegaly. - Labs from 08/24/2024: Normal LFTs.  LDH is normal.  CBC grossly normal with stable mild thrombocytopenia.  Hemoglobin is slightly lower at 12.9.  Will repeat in 6 months along with ferritin and iron panel.  RTC 6 months for follow-up. Left lower quadrant abdominal pain - He does not have any fevers or weight loss.  He has occasional night sweats once or twice a week which are stable for more than a year. - Physical exam: No palpable adenopathy or splenomegaly. - Labs from 08/24/2024: Normal LFTs.  LDH is normal.  CBC grossly normal with stable mild thrombocytopenia.  Hemoglobin has improved some from previous and is now 12.9.  Iron levels and ferritin are within normal range.  RTC 6 months for follow-up. Elevated serum creatinine Elevated creatinine levels over the past few lab draws. Most recent creatinine 1.26 (1.36) with decreased GFR. He is currently not followed by a kidney specialist although he sees his PCP regularly.  No orders of the defined types were placed in this encounter.   INTERVAL HISTORY: Patient returns for follow-up.[name] [name] is a 79 y.o. male presenting to clinic today for follow up of stage IVb high-grade B-cell lymphoma. He was last seen by me on 09/06/2023.   Today, he states that he is doing well overall. His appetite level is at 25%. His energy level is at 25%.  We reviewed CMP, LDH, iron panel and ferritin.  SUMMARY OF HEMATOLOGIC HISTORY: Oncology History  High grade B-cell lymphoma (HCC)  12/03/2021 Initial Diagnosis   High grade B-cell lymphoma (HCC)   12/15/2021 - 04/03/2022 Chemotherapy   Patient is on Treatment Plan :  NON-HODGKIN'S LYMPHOMA R-CEOP q21d x 3 Cycles       Assessment: 1. Stage IVb high-grade B-cell lymphoma: - He had unintentional weight loss of 20 pounds since September 2022.  Weight has been stable for the last 2 months.  He had multiple family members with pancreatic cancer. - CT AP with contrast (10/31/2021): Abnormal nodular thickening of the right anterior pararenal fascia, suspicious for atypical malignancy.  No evidence of pancreatic malignancy.  Irregular wall thickening of the proximal stomach.  No ascites or peritoneal nodularity. - Biopsy of nodular thickening (11/17/2021): High-grade B-cell lymphoma with Burkitt-like features.  Ki-67 100%. 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  Neoplastic lymphocytes positive for CD20, CD10, Bcl-2, BCL6 and mum 1.  Ki-67 more than 95%.  H pylori IHC negative. - High-grade lymphoma panel (11/17/2021): Negative for BCL6, MYC, BCL2 rearrangements, negative for MYC amplification, t(8:14) not detected. - High risk features for CNS disease: Age more than 60, stage III/IV, extranodal involvement more than 1 site.  Based on 3 points, intermediate risk.  No clinical signs of CNS involvement. - IPI: High intermediate with 3 points (age more than 60, stage III/IV, extranodal involvement more than 1 site) - 6 cycles of R-CEOP from 12/15/2021 through 03/31/2022 - PET scan (04/30/2022): No evidence of lymphoma ([place] 1 response).   2. Social/family history: - He has AICD placed in June 2021 secondary to CHF. - He is a  retired cytogeneticist.  He worked at [employer] in [place].  He had exposure to some chemicals.  He quit smoking more than 40 years ago.  He drinks mostly beer and occasionally whiskey at bedtime.   3. Ischemic cardiomyopathy/CHF/apical thrombus: - He is on Coumadin .  Will check PT/INR today. - Last seen by Dr. Pietro on 11/06/2021.   -Last echo on 12/27/2020 with EF  30-35%.  Limited visualization of endocardium.  LV has moderately decreased function.   CBC    Component Value Date/Time   WBC 5.4 08/24/2024 1003   RBC 4.57 08/24/2024 1003   HGB 14.4 08/24/2024 1003   HGB 15.0 12/18/2020 0901   HCT 43.6 08/24/2024 1003   HCT 44.2 12/18/2020 0901   PLT 118 (L) 08/24/2024 1003   PLT 122 (L) 12/18/2020 0901   MCV 95.4 08/24/2024 1003   MCV 93 12/18/2020 0901   MCH 31.5 08/24/2024 1003   MCHC 33.0 08/24/2024 1003   RDW 13.3 08/24/2024 1003   RDW 12.8 12/18/2020 0901   LYMPHSABS 2.1 08/24/2024 1003   LYMPHSABS 1.4 01/26/2020 0937   MONOABS 0.4 08/24/2024 1003   EOSABS 0.1 08/24/2024 1003   EOSABS 0.2 01/26/2020 0937   BASOSABS 0.0 08/24/2024 1003   BASOSABS 0.0 01/26/2020 0937       Latest Ref Rng & Units 08/24/2024   10:03 AM 04/05/2024    4:11 PM 02/28/2024    1:43 PM  CMP  Glucose 70 - 99 mg/dL 892  895  889   BUN 8 - 23 mg/dL 20  22  28    Creatinine 0.61 - 1.24 mg/dL 8.73  8.63  8.73   Sodium 135 - 145 mmol/L 139  139  136   Potassium 3.5 - 5.1 mmol/L 4.0  4.2  4.1   Chloride 98 - 111 mmol/L 101  98  100   CO2 22 - 32 mmol/L 25  31  28    Calcium  8.9 - 10.3 mg/dL 9.2  9.4  8.7   Total Protein 6.5 - 8.1 g/dL 6.9  7.5  6.6   Total Bilirubin 0.0 - 1.2 mg/dL 0.9  0.8  1.4   Alkaline Phos 38 - 126 U/L 59  57  67   AST 15 - 41 U/L 29  24  33   ALT 0 - 44 U/L 18  15  29       Lab Results  Component Value Date   FERRITIN 229 08/24/2024   VITAMINB12 602 04/05/2024    There were no vitals filed for this visit.  Review of System:  ROS  Physical Exam: Physical Exam Constitutional:      Appearance: Normal appearance.  HENT:     Head: Normocephalic and atraumatic.  Eyes:     Pupils: Pupils are equal, round, and reactive to light.  Cardiovascular:     Rate and Rhythm: Normal rate and regular rhythm.     Heart sounds: Normal heart sounds. No murmur heard. Pulmonary:     Effort: Pulmonary effort is normal.     Breath sounds:  Normal breath sounds. No wheezing.  Abdominal:     General: Bowel sounds are normal. There is no distension.     Palpations: Abdomen is soft.     Tenderness: There is no abdominal tenderness.  Musculoskeletal:        General: Normal range of motion.     Cervical back: Normal range of motion.  Skin:    General: Skin is warm and dry.  Findings: No rash.  Neurological:     Mental Status: He is alert and oriented to person, place, and time.     Gait: Gait is intact.  Psychiatric:        Mood and Affect: Mood and affect normal.        Cognition and Memory: Memory normal.        Judgment: Judgment normal.      I spent *** minutes dedicated to the care of this patient (face-to-face and non-face-to-face) on the date of the encounter to include what is described in the assessment and plan.,  Delon Hope, NP 08/31/2024 8:03 AM "

## 2024-09-06 ENCOUNTER — Inpatient Hospital Stay: Admitting: Oncology

## 2024-09-06 VITALS — BP 136/83 | HR 60 | Temp 97.6°F | Resp 16 | Wt 196.4 lb

## 2024-09-06 DIAGNOSIS — D696 Thrombocytopenia, unspecified: Secondary | ICD-10-CM

## 2024-09-06 DIAGNOSIS — R1024 Suprapubic pain: Secondary | ICD-10-CM | POA: Diagnosis not present

## 2024-09-06 DIAGNOSIS — I428 Other cardiomyopathies: Secondary | ICD-10-CM | POA: Diagnosis not present

## 2024-09-06 DIAGNOSIS — R1032 Left lower quadrant pain: Secondary | ICD-10-CM

## 2024-09-06 DIAGNOSIS — Z800 Family history of malignant neoplasm of digestive organs: Secondary | ICD-10-CM | POA: Diagnosis not present

## 2024-09-06 DIAGNOSIS — Z8572 Personal history of non-Hodgkin lymphomas: Secondary | ICD-10-CM

## 2024-09-06 DIAGNOSIS — Z7901 Long term (current) use of anticoagulants: Secondary | ICD-10-CM | POA: Diagnosis not present

## 2024-09-06 DIAGNOSIS — R7989 Other specified abnormal findings of blood chemistry: Secondary | ICD-10-CM | POA: Diagnosis not present

## 2024-09-06 DIAGNOSIS — I509 Heart failure, unspecified: Secondary | ICD-10-CM

## 2024-09-06 DIAGNOSIS — C8510 Unspecified B-cell lymphoma, unspecified site: Secondary | ICD-10-CM

## 2024-09-06 NOTE — Assessment & Plan Note (Addendum)
 Elevated creatinine levels over the past few lab draws. Most recent creatinine 1.26 (1.36) with decreased GFR. He is currently not followed by a kidney specialist although he sees his PCP regularly.

## 2024-09-06 NOTE — Progress Notes (Signed)
 "  [name] OFFICE PROGRESS NOTE  Bevely Doffing, FNP  ASSESSMENT & PLAN:    Assessment & Plan High grade B-cell lymphoma (HCC) - He does not have any fevers or weight loss.  He has occasional night sweats once or twice a week which are stable for more than a year. - Physical exam: No palpable adenopathy or splenomegaly. - Labs from 08/24/2024: Normal LFTs.  LDH is normal.  CBC grossly normal with stable mild thrombocytopenia.  Hemoglobin is slightly lower at 12.9.  Will repeat in 6 months along with ferritin and iron panel.  RTC 6 months for follow-up. Left lower quadrant abdominal pain - He does not have any fevers or weight loss.  -Reports pelvic discomfort that is relieved when he urinates.  Reports he has been seen by urology in the past but not recently. -Patient reports he has had 5-6 family members that were all male died of pancreatic cancer.  He has never had genetic testing. -Most recently his brother died of pancreatic cancer in his late 16s. - Physical exam: No palpable adenopathy or splenomegaly. -We discussed in detail and I would recommend a CT CAP given he has not had imaging in quite some time.  Most recent CT abdomen was from July 2025. - Labs from 08/24/2024: Normal LFTs.  LDH is normal.  CBC grossly normal with stable mild thrombocytopenia.  Hemoglobin has improved some from previous and is now 12.9.  Iron levels and ferritin are within normal range.  RTC 6 months for follow-up. Elevated serum creatinine Elevated creatinine levels over the past few lab draws. Most recent creatinine 1.26 (1.36) with decreased GFR. He is currently not followed by a kidney specialist although he sees his PCP regularly. Suprapubic pain Unclear etiology but seems to improve after he urinates. Plan on getting CT CAP for evaluation. He would likely need referral back to urology as well. Family history of pancreatic cancer Patient has strong family history of pancreatic  cancer. Patient's uncle died about 7 or 8 years ago, 2  male cousins in their late 12s and baby brother in his late 73s 2 years ago. We discussed genetic screening. Will send referral.  Orders Placed This Encounter  Procedures   CT CHEST ABDOMEN PELVIS W CONTRAST    Standing Status:   Future    Expected Date:   10/07/2024    Expiration Date:   09/06/2025    If indicated for the ordered procedure, I authorize the administration of contrast media per Radiology protocol:   Yes    Does the patient have a contrast media/X-ray dye allergy?:   Yes    Preferred imaging location?:   [facility]    If indicated for the ordered procedure, I authorize the administration of oral contrast media per Radiology protocol:   Yes   Comprehensive metabolic panel    Standing Status:   Future    Expected Date:   09/06/2024    Expiration Date:   09/06/2025   Lactate dehydrogenase    Standing Status:   Future    Expected Date:   09/06/2024    Expiration Date:   09/06/2025   Iron and TIBC ([facility] ONLY)    Standing Status:   Future    Expected Date:   09/06/2024    Expiration Date:   09/06/2025   Ferritin    Standing Status:   Future    Expected Date:   09/06/2024    Expiration Date:  09/06/2025   CBC with Differential    Standing Status:   Future    Expected Date:   09/06/2024    Expiration Date:   09/06/2025   Ambulatory referral to Genetics    Referral Priority:   Routine    Referral Type:   Consultation    Referral Reason:   Specialty Services Required    Number of Visits Requested:   1   Ambulatory referral to Urology    Referral Priority:   Routine    Referral Type:   Consultation    Referral Reason:   Specialty Services Required    Requested Specialty:   Urology    Number of Visits Requested:   1    INTERVAL HISTORY: Patient returns for follow-up.[name] [name] is a 79 y.o. male presenting to clinic today for follow up of stage IVb high-grade B-cell lymphoma. He was last seen  by me on 03/06/2024.  Patient presents alone today.  Reports overall he has done well.  He has very low appetite and energy levels.  Reports he has to force himself to eat.  He does not have any weight loss.  He does have some lower abdominal pain but it seems to get better after he pees.  He has had a change in his bowel movements from constipation to loose stools.  He has some early satiety.  No recent colonoscopy or EGD.  Most recent CT scan of his abdomen is from 02/07/2024 for left lower quadrant pain which showed no abnormality but he did have diverticular disease of the colon without acute inflammation.  Patient reports strong family history of pancreatic cancer.  He lost his uncle 7 years ago followed by 2 male cousins and more recently his brother in his late 22s.  He denies any new lumps or bumps.  We reviewed CMP, LDH, iron panel and ferritin.  SUMMARY OF HEMATOLOGIC HISTORY: Oncology History  High grade B-cell lymphoma (HCC)  12/03/2021 Initial Diagnosis   High grade B-cell lymphoma (HCC)   12/15/2021 - 04/03/2022 Chemotherapy   Patient is on Treatment Plan : NON-HODGKIN'S LYMPHOMA R-CEOP q21d x 3 Cycles       Assessment: 1. Stage IVb high-grade B-cell lymphoma: - He had unintentional weight loss of 20 pounds since September 2022.  Weight has been stable for the last 2 months.  He had multiple family members with pancreatic cancer. - CT AP with contrast (10/31/2021): Abnormal nodular thickening of the right anterior pararenal fascia, suspicious for atypical malignancy.  No evidence of pancreatic malignancy.  Irregular wall thickening of the proximal stomach.  No ascites or peritoneal nodularity. - Biopsy of nodular thickening (11/17/2021): High-grade B-cell lymphoma with Burkitt-like features.  Ki-67 100%. 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  Neoplastic lymphocytes positive for CD20, CD10, Bcl-2,  BCL6 and mum 1.  Ki-67 more than 95%.  H pylori IHC negative. - High-grade lymphoma panel (11/17/2021): Negative for BCL6, MYC, BCL2 rearrangements, negative for MYC amplification, t(8:14) not detected. - High risk features for CNS disease: Age more than 60, stage III/IV, extranodal involvement more than 1 site.  Based on 3 points, intermediate risk.  No clinical signs of CNS involvement. - IPI: High intermediate with 3 points (age more than 60, stage III/IV, extranodal involvement more than 1 site) - 6 cycles of R-CEOP from 12/15/2021 through 03/31/2022 - PET scan (04/30/2022): No evidence of lymphoma ([place] 1 response).   2. Social/family history: - He  has AICD placed in June 2021 secondary to CHF. - He is a retired cytogeneticist.  He worked at [employer] in [place].  He had exposure to some chemicals.  He quit smoking more than 40 years ago.  He drinks mostly beer and occasionally whiskey at bedtime.   3. Ischemic cardiomyopathy/CHF/apical thrombus: - He is on Coumadin .  Will check PT/INR today. - Last seen by Dr. Pietro on 11/06/2021.   -Last echo on 12/27/2020 with EF 30-35%.  Limited visualization of endocardium.  LV has moderately decreased function.   CBC    Component Value Date/Time   WBC 5.4 08/24/2024 1003   RBC 4.57 08/24/2024 1003   HGB 14.4 08/24/2024 1003   HGB 15.0 12/18/2020 0901   HCT 43.6 08/24/2024 1003   HCT 44.2 12/18/2020 0901   PLT 118 (L) 08/24/2024 1003   PLT 122 (L) 12/18/2020 0901   MCV 95.4 08/24/2024 1003   MCV 93 12/18/2020 0901   MCH 31.5 08/24/2024 1003   MCHC 33.0 08/24/2024 1003   RDW 13.3 08/24/2024 1003   RDW 12.8 12/18/2020 0901   LYMPHSABS 2.1 08/24/2024 1003   LYMPHSABS 1.4 01/26/2020 0937   MONOABS 0.4 08/24/2024 1003   EOSABS 0.1 08/24/2024 1003   EOSABS 0.2 01/26/2020 0937   BASOSABS 0.0 08/24/2024 1003   BASOSABS 0.0 01/26/2020 0937       Latest Ref Rng & Units 08/24/2024   10:03 AM 04/05/2024    4:11 PM 02/28/2024    1:43 PM  CMP   Glucose 70 - 99 mg/dL 892  895  889   BUN 8 - 23 mg/dL 20  22  28    Creatinine 0.61 - 1.24 mg/dL 8.73  8.63  8.73   Sodium 135 - 145 mmol/L 139  139  136   Potassium 3.5 - 5.1 mmol/L 4.0  4.2  4.1   Chloride 98 - 111 mmol/L 101  98  100   CO2 22 - 32 mmol/L 25  31  28    Calcium  8.9 - 10.3 mg/dL 9.2  9.4  8.7   Total Protein 6.5 - 8.1 g/dL 6.9  7.5  6.6   Total Bilirubin 0.0 - 1.2 mg/dL 0.9  0.8  1.4   Alkaline Phos 38 - 126 U/L 59  57  67   AST 15 - 41 U/L 29  24  33   ALT 0 - 44 U/L 18  15  29       Lab Results  Component Value Date   FERRITIN 229 08/24/2024   VITAMINB12 602 04/05/2024    Vitals:   09/06/24 1242  BP: 136/83  Pulse: 60  Resp: 16  Temp: 97.6 F (36.4 C)  SpO2: 97%    Review of System:  Review of Systems  Respiratory:  Positive for shortness of breath.   Cardiovascular:  Positive for palpitations.  Gastrointestinal:  Positive for abdominal pain and diarrhea. Negative for blood in stool, heartburn, melena and nausea.  Genitourinary:  Positive for frequency and urgency. Negative for flank pain.  Psychiatric/Behavioral:  The patient has insomnia.     Physical Exam: Physical Exam Constitutional:      Appearance: Normal appearance.  HENT:     Head: Normocephalic and atraumatic.  Eyes:     Pupils: Pupils are equal, round, and reactive to light.  Cardiovascular:     Rate and Rhythm: Normal rate and regular rhythm.     Heart sounds: Normal heart sounds. No murmur heard. Pulmonary:  Effort: Pulmonary effort is normal.     Breath sounds: Normal breath sounds. No wheezing.  Abdominal:     General: Bowel sounds are normal. There is no distension.     Palpations: Abdomen is soft.     Tenderness: There is no abdominal tenderness.  Musculoskeletal:        General: Normal range of motion.     Cervical back: Normal range of motion.  Skin:    General: Skin is warm and dry.     Findings: No rash.  Neurological:     Mental Status: He is alert and  oriented to person, place, and time.     Gait: Gait is intact.  Psychiatric:        Mood and Affect: Mood and affect normal.        Cognition and Memory: Memory normal.        Judgment: Judgment normal.      I spent 22 minutes dedicated to the care of this patient (face-to-face and non-face-to-face) on the date of the encounter to include what is described in the assessment and plan.,  Delon Hope, NP 09/06/2024 1:30 PM "

## 2024-09-06 NOTE — Assessment & Plan Note (Addendum)
-   He does not have any fevers or weight loss.  He has occasional night sweats once or twice a week which are stable for more than a year. - Physical exam: No palpable adenopathy or splenomegaly. - Labs from 08/24/2024: Normal LFTs.  LDH is normal.  CBC grossly normal with stable mild thrombocytopenia.  Hemoglobin is slightly lower at 12.9.  Will repeat in 6 months along with ferritin and iron panel.  RTC 6 months for follow-up.

## 2024-09-06 NOTE — Assessment & Plan Note (Addendum)
 Patient has strong family history of pancreatic cancer. Patient's uncle died about 7 or 8 years ago, 2  male cousins in their late 37s and baby brother in his late 24s 2 years ago. We discussed genetic screening. Will send referral.

## 2024-09-06 NOTE — Assessment & Plan Note (Addendum)
-   He does not have any fevers or weight loss.  -Reports pelvic discomfort that is relieved when he urinates.  Reports he has been seen by urology in the past but not recently. -Patient reports he has had 5-6 family members that were all male died of pancreatic cancer.  He has never had genetic testing. -Most recently his brother died of pancreatic cancer in his late 32s. - Physical exam: No palpable adenopathy or splenomegaly. -We discussed in detail and I would recommend a CT CAP given he has not had imaging in quite some time.  Most recent CT abdomen was from July 2025. - Labs from 08/24/2024: Normal LFTs.  LDH is normal.  CBC grossly normal with stable mild thrombocytopenia.  Hemoglobin has improved some from previous and is now 12.9.  Iron levels and ferritin are within normal range.  RTC 6 months for follow-up.

## 2024-09-07 NOTE — Progress Notes (Signed)
 31 day ICM Remote transmission canceled due to [facility] clinic is on hold until further notice.  91 day remote monitoring will continue per protocol.

## 2024-09-13 ENCOUNTER — Ambulatory Visit

## 2024-09-13 DIAGNOSIS — Z5181 Encounter for therapeutic drug level monitoring: Secondary | ICD-10-CM

## 2024-09-13 DIAGNOSIS — I513 Intracardiac thrombosis, not elsewhere classified: Secondary | ICD-10-CM | POA: Diagnosis not present

## 2024-09-13 LAB — POCT INR: INR: 1.9 — AB (ref 2.0–3.0)

## 2024-09-13 NOTE — Patient Instructions (Signed)
 Increase warfarin to 1 tablet daily except 1 1/2 tablets on Wednesdays Continue greens Recheck in 4 wks

## 2024-09-13 NOTE — Progress Notes (Signed)
 INR 1.9

## 2024-09-21 ENCOUNTER — Inpatient Hospital Stay: Attending: Hematology | Admitting: Licensed Clinical Social Worker

## 2024-09-21 ENCOUNTER — Ambulatory Visit

## 2024-09-26 ENCOUNTER — Ambulatory Visit (HOSPITAL_COMMUNITY)

## 2024-09-29 ENCOUNTER — Ambulatory Visit (HOSPITAL_COMMUNITY): Admitting: Occupational Therapy

## 2024-10-11 ENCOUNTER — Ambulatory Visit

## 2024-10-20 ENCOUNTER — Ambulatory Visit

## 2024-11-02 ENCOUNTER — Ambulatory Visit

## 2024-11-03 ENCOUNTER — Ambulatory Visit: Admitting: Cardiology

## 2024-11-07 ENCOUNTER — Ambulatory Visit

## 2024-12-01 ENCOUNTER — Inpatient Hospital Stay

## 2024-12-08 ENCOUNTER — Inpatient Hospital Stay: Admitting: Oncology

## 2025-02-06 ENCOUNTER — Ambulatory Visit

## 2025-02-27 ENCOUNTER — Ambulatory Visit: Payer: Self-pay

## 2025-05-08 ENCOUNTER — Ambulatory Visit

## 2025-08-07 ENCOUNTER — Ambulatory Visit
